# Patient Record
Sex: Male | Born: 1935 | Race: White | Hispanic: No | Marital: Married | Smoking: Former smoker
Health system: Southern US, Community
[De-identification: ages and names within clinical notes are randomized; demographics above are authoritative.]

## PROBLEM LIST (undated history)

## (undated) DIAGNOSIS — C4490 Unspecified malignant neoplasm of skin, unspecified: Secondary | ICD-10-CM

## (undated) DIAGNOSIS — I872 Venous insufficiency (chronic) (peripheral): Secondary | ICD-10-CM

## (undated) DIAGNOSIS — G9340 Encephalopathy, unspecified: Secondary | ICD-10-CM

## (undated) DIAGNOSIS — I4891 Unspecified atrial fibrillation: Secondary | ICD-10-CM

## (undated) DIAGNOSIS — K648 Other hemorrhoids: Secondary | ICD-10-CM

## (undated) DIAGNOSIS — G4733 Obstructive sleep apnea (adult) (pediatric): Secondary | ICD-10-CM

## (undated) DIAGNOSIS — I209 Angina pectoris, unspecified: Secondary | ICD-10-CM

## (undated) DIAGNOSIS — I350 Nonrheumatic aortic (valve) stenosis: Secondary | ICD-10-CM

## (undated) DIAGNOSIS — T4145XA Adverse effect of unspecified anesthetic, initial encounter: Secondary | ICD-10-CM

## (undated) DIAGNOSIS — I8501 Esophageal varices with bleeding: Secondary | ICD-10-CM

## (undated) DIAGNOSIS — M1990 Unspecified osteoarthritis, unspecified site: Secondary | ICD-10-CM

## (undated) DIAGNOSIS — C439 Malignant melanoma of skin, unspecified: Secondary | ICD-10-CM

## (undated) DIAGNOSIS — R001 Bradycardia, unspecified: Secondary | ICD-10-CM

## (undated) DIAGNOSIS — M545 Low back pain, unspecified: Secondary | ICD-10-CM

## (undated) DIAGNOSIS — K2270 Barrett's esophagus without dysplasia: Secondary | ICD-10-CM

## (undated) DIAGNOSIS — T8859XA Other complications of anesthesia, initial encounter: Secondary | ICD-10-CM

## (undated) DIAGNOSIS — Z9289 Personal history of other medical treatment: Secondary | ICD-10-CM

## (undated) DIAGNOSIS — I5030 Unspecified diastolic (congestive) heart failure: Secondary | ICD-10-CM

## (undated) DIAGNOSIS — K219 Gastro-esophageal reflux disease without esophagitis: Secondary | ICD-10-CM

## (undated) DIAGNOSIS — I10 Essential (primary) hypertension: Secondary | ICD-10-CM

## (undated) DIAGNOSIS — Z8719 Personal history of other diseases of the digestive system: Secondary | ICD-10-CM

## (undated) DIAGNOSIS — K224 Dyskinesia of esophagus: Secondary | ICD-10-CM

## (undated) DIAGNOSIS — K5790 Diverticulosis of intestine, part unspecified, without perforation or abscess without bleeding: Secondary | ICD-10-CM

## (undated) DIAGNOSIS — D696 Thrombocytopenia, unspecified: Secondary | ICD-10-CM

## (undated) DIAGNOSIS — K7030 Alcoholic cirrhosis of liver without ascites: Secondary | ICD-10-CM

## (undated) DIAGNOSIS — K31819 Angiodysplasia of stomach and duodenum without bleeding: Secondary | ICD-10-CM

## (undated) DIAGNOSIS — K652 Spontaneous bacterial peritonitis: Principal | ICD-10-CM

## (undated) DIAGNOSIS — IMO0002 Reserved for concepts with insufficient information to code with codable children: Secondary | ICD-10-CM

## (undated) DIAGNOSIS — R0602 Shortness of breath: Secondary | ICD-10-CM

## (undated) DIAGNOSIS — Z9989 Dependence on other enabling machines and devices: Secondary | ICD-10-CM

## (undated) DIAGNOSIS — T17908A Unspecified foreign body in respiratory tract, part unspecified causing other injury, initial encounter: Secondary | ICD-10-CM

## (undated) DIAGNOSIS — R011 Cardiac murmur, unspecified: Secondary | ICD-10-CM

## (undated) DIAGNOSIS — L0390 Cellulitis, unspecified: Secondary | ICD-10-CM

## (undated) DIAGNOSIS — D649 Anemia, unspecified: Secondary | ICD-10-CM

## (undated) DIAGNOSIS — G8929 Other chronic pain: Secondary | ICD-10-CM

## (undated) DIAGNOSIS — E119 Type 2 diabetes mellitus without complications: Secondary | ICD-10-CM

## (undated) DIAGNOSIS — I509 Heart failure, unspecified: Secondary | ICD-10-CM

## (undated) DIAGNOSIS — L711 Rhinophyma: Secondary | ICD-10-CM

## (undated) DIAGNOSIS — I2510 Atherosclerotic heart disease of native coronary artery without angina pectoris: Secondary | ICD-10-CM

## (undated) DIAGNOSIS — R9430 Abnormal result of cardiovascular function study, unspecified: Secondary | ICD-10-CM

## (undated) DIAGNOSIS — R0989 Other specified symptoms and signs involving the circulatory and respiratory systems: Secondary | ICD-10-CM

## (undated) DIAGNOSIS — H5460 Unqualified visual loss, one eye, unspecified: Secondary | ICD-10-CM

## (undated) HISTORY — PX: CATARACT EXTRACTION W/ INTRAOCULAR LENS  IMPLANT, BILATERAL: SHX1307

## (undated) HISTORY — DX: Malignant melanoma of skin, unspecified: C43.9

## (undated) HISTORY — DX: Rhinophyma: L71.1

## (undated) HISTORY — DX: Diverticulosis of intestine, part unspecified, without perforation or abscess without bleeding: K57.90

## (undated) HISTORY — DX: Essential (primary) hypertension: I10

## (undated) HISTORY — DX: Thrombocytopenia, unspecified: D69.6

## (undated) HISTORY — PX: ELBOW SURGERY: SHX618

## (undated) HISTORY — PX: RELEASE SCAR CONTRACTURE / GRAFT REPAIRS OF HAND: SUR1098

## (undated) HISTORY — PX: TONSILLECTOMY: SUR1361

## (undated) HISTORY — DX: Cellulitis, unspecified: L03.90

## (undated) HISTORY — DX: Nonrheumatic aortic (valve) stenosis: I35.0

## (undated) HISTORY — DX: Unspecified diastolic (congestive) heart failure: I50.30

## (undated) HISTORY — PX: TOTAL KNEE ARTHROPLASTY: SHX125

## (undated) HISTORY — DX: Other specified symptoms and signs involving the circulatory and respiratory systems: R09.89

## (undated) HISTORY — PX: EXCISIONAL HEMORRHOIDECTOMY: SHX1541

## (undated) HISTORY — DX: Esophageal varices with bleeding: I85.01

## (undated) HISTORY — PX: HIATAL HERNIA REPAIR: SHX195

## (undated) HISTORY — DX: Unqualified visual loss, one eye, unspecified: H54.60

## (undated) HISTORY — DX: Unspecified foreign body in respiratory tract, part unspecified causing other injury, initial encounter: T17.908A

## (undated) HISTORY — DX: Reserved for concepts with insufficient information to code with codable children: IMO0002

## (undated) HISTORY — PX: BACK SURGERY: SHX140

## (undated) HISTORY — DX: Unspecified malignant neoplasm of skin, unspecified: C44.90

## (undated) HISTORY — DX: Venous insufficiency (chronic) (peripheral): I87.2

## (undated) HISTORY — DX: Abnormal result of cardiovascular function study, unspecified: R94.30

## (undated) HISTORY — DX: Type 2 diabetes mellitus without complications: E11.9

## (undated) HISTORY — DX: Unspecified atrial fibrillation: I48.91

## (undated) HISTORY — PX: JOINT REPLACEMENT: SHX530

## (undated) HISTORY — DX: Gastro-esophageal reflux disease without esophagitis: K21.9

## (undated) HISTORY — DX: Atherosclerotic heart disease of native coronary artery without angina pectoris: I25.10

## (undated) HISTORY — DX: Anemia, unspecified: D64.9

## (undated) HISTORY — DX: Other hemorrhoids: K64.8

## (undated) HISTORY — DX: Encephalopathy, unspecified: G93.40

## (undated) HISTORY — DX: Bradycardia, unspecified: R00.1

## (undated) HISTORY — DX: Barrett's esophagus without dysplasia: K22.70

## (undated) HISTORY — DX: Heart failure, unspecified: I50.9

## (undated) HISTORY — DX: Alcoholic cirrhosis of liver without ascites: K70.30

## (undated) HISTORY — PX: APPENDECTOMY: SHX54

## (undated) HISTORY — DX: Angiodysplasia of stomach and duodenum without bleeding: K31.819

## (undated) HISTORY — DX: Dyskinesia of esophagus: K22.4

## (undated) HISTORY — DX: Spontaneous bacterial peritonitis: K65.2

## (undated) HISTORY — DX: Unspecified osteoarthritis, unspecified site: M19.90

---

## 1991-02-22 HISTORY — PX: CORONARY ARTERY BYPASS GRAFT: SHX141

## 1997-06-02 ENCOUNTER — Encounter: Admission: RE | Admit: 1997-06-02 | Discharge: 1997-08-31 | Payer: Self-pay | Admitting: Internal Medicine

## 1997-10-23 ENCOUNTER — Ambulatory Visit (HOSPITAL_COMMUNITY): Admission: RE | Admit: 1997-10-23 | Discharge: 1997-10-23 | Payer: Self-pay | Admitting: Specialist

## 1998-05-27 ENCOUNTER — Observation Stay (HOSPITAL_COMMUNITY): Admission: AD | Admit: 1998-05-27 | Discharge: 1998-05-28 | Payer: Self-pay | Admitting: *Deleted

## 1998-06-29 ENCOUNTER — Encounter: Payer: Self-pay | Admitting: Specialist

## 1998-06-29 ENCOUNTER — Ambulatory Visit (HOSPITAL_COMMUNITY): Admission: RE | Admit: 1998-06-29 | Discharge: 1998-06-29 | Payer: Self-pay | Admitting: Specialist

## 1998-07-22 ENCOUNTER — Encounter: Payer: Self-pay | Admitting: Specialist

## 1998-07-22 ENCOUNTER — Ambulatory Visit (HOSPITAL_COMMUNITY): Admission: RE | Admit: 1998-07-22 | Discharge: 1998-07-22 | Payer: Self-pay | Admitting: Specialist

## 1999-02-23 ENCOUNTER — Ambulatory Visit: Admission: RE | Admit: 1999-02-23 | Discharge: 1999-02-23 | Payer: Self-pay | Admitting: Internal Medicine

## 1999-03-30 ENCOUNTER — Ambulatory Visit: Admission: RE | Admit: 1999-03-30 | Discharge: 1999-03-30 | Payer: Self-pay | Admitting: Cardiology

## 1999-07-21 ENCOUNTER — Encounter: Payer: Self-pay | Admitting: Specialist

## 1999-07-28 ENCOUNTER — Inpatient Hospital Stay (HOSPITAL_COMMUNITY): Admission: RE | Admit: 1999-07-28 | Discharge: 1999-08-02 | Payer: Self-pay | Admitting: Specialist

## 1999-07-29 ENCOUNTER — Encounter: Payer: Self-pay | Admitting: Internal Medicine

## 1999-07-31 ENCOUNTER — Encounter: Payer: Self-pay | Admitting: Internal Medicine

## 1999-12-28 ENCOUNTER — Ambulatory Visit (HOSPITAL_BASED_OUTPATIENT_CLINIC_OR_DEPARTMENT_OTHER): Admission: RE | Admit: 1999-12-28 | Discharge: 1999-12-28 | Payer: Self-pay | Admitting: Critical Care Medicine

## 2000-02-13 ENCOUNTER — Ambulatory Visit (HOSPITAL_BASED_OUTPATIENT_CLINIC_OR_DEPARTMENT_OTHER): Admission: RE | Admit: 2000-02-13 | Discharge: 2000-02-13 | Payer: Self-pay | Admitting: Critical Care Medicine

## 2001-09-07 ENCOUNTER — Ambulatory Visit (HOSPITAL_COMMUNITY): Admission: RE | Admit: 2001-09-07 | Discharge: 2001-09-07 | Payer: Self-pay | Admitting: Family Medicine

## 2002-03-25 ENCOUNTER — Encounter: Payer: Self-pay | Admitting: Otolaryngology

## 2002-03-25 ENCOUNTER — Encounter: Admission: RE | Admit: 2002-03-25 | Discharge: 2002-03-25 | Payer: Self-pay | Admitting: Otolaryngology

## 2002-04-23 ENCOUNTER — Ambulatory Visit (HOSPITAL_COMMUNITY): Admission: RE | Admit: 2002-04-23 | Discharge: 2002-04-23 | Payer: Self-pay | Admitting: Gastroenterology

## 2002-04-23 ENCOUNTER — Encounter: Payer: Self-pay | Admitting: Gastroenterology

## 2002-05-01 ENCOUNTER — Ambulatory Visit (HOSPITAL_COMMUNITY): Admission: RE | Admit: 2002-05-01 | Discharge: 2002-05-01 | Payer: Self-pay | Admitting: Gastroenterology

## 2002-07-30 ENCOUNTER — Inpatient Hospital Stay (HOSPITAL_COMMUNITY): Admission: EM | Admit: 2002-07-30 | Discharge: 2002-08-02 | Payer: Self-pay | Admitting: Internal Medicine

## 2002-07-31 ENCOUNTER — Encounter: Payer: Self-pay | Admitting: Cardiovascular Disease

## 2002-09-02 ENCOUNTER — Ambulatory Visit (HOSPITAL_COMMUNITY): Admission: RE | Admit: 2002-09-02 | Discharge: 2002-09-02 | Payer: Self-pay | Admitting: Gastroenterology

## 2002-09-02 ENCOUNTER — Encounter: Payer: Self-pay | Admitting: Gastroenterology

## 2003-04-03 ENCOUNTER — Ambulatory Visit (HOSPITAL_COMMUNITY): Admission: RE | Admit: 2003-04-03 | Discharge: 2003-04-03 | Payer: Self-pay | Admitting: Gastroenterology

## 2003-05-01 ENCOUNTER — Encounter: Admission: RE | Admit: 2003-05-01 | Discharge: 2003-05-01 | Payer: Self-pay | Admitting: Specialist

## 2003-07-09 ENCOUNTER — Ambulatory Visit (HOSPITAL_COMMUNITY): Admission: RE | Admit: 2003-07-09 | Discharge: 2003-07-09 | Payer: Self-pay | Admitting: Gastroenterology

## 2003-07-09 ENCOUNTER — Encounter (INDEPENDENT_AMBULATORY_CARE_PROVIDER_SITE_OTHER): Payer: Self-pay | Admitting: *Deleted

## 2004-03-03 ENCOUNTER — Ambulatory Visit: Payer: Self-pay | Admitting: Cardiology

## 2004-03-04 ENCOUNTER — Ambulatory Visit: Admission: RE | Admit: 2004-03-04 | Discharge: 2004-03-04 | Payer: Self-pay | Admitting: Orthopedic Surgery

## 2004-03-04 ENCOUNTER — Ambulatory Visit: Payer: Self-pay | Admitting: Internal Medicine

## 2004-03-10 ENCOUNTER — Ambulatory Visit: Payer: Self-pay | Admitting: Internal Medicine

## 2004-03-11 ENCOUNTER — Ambulatory Visit: Payer: Self-pay

## 2004-03-12 ENCOUNTER — Ambulatory Visit: Payer: Self-pay | Admitting: Hematology & Oncology

## 2004-04-23 ENCOUNTER — Ambulatory Visit (HOSPITAL_COMMUNITY): Admission: RE | Admit: 2004-04-23 | Discharge: 2004-04-23 | Payer: Self-pay | Admitting: Orthopedic Surgery

## 2004-07-27 ENCOUNTER — Ambulatory Visit: Payer: Self-pay | Admitting: Internal Medicine

## 2004-07-29 ENCOUNTER — Ambulatory Visit: Payer: Self-pay | Admitting: Internal Medicine

## 2004-11-14 ENCOUNTER — Emergency Department (HOSPITAL_COMMUNITY): Admission: EM | Admit: 2004-11-14 | Discharge: 2004-11-14 | Payer: Self-pay | Admitting: Emergency Medicine

## 2004-11-18 ENCOUNTER — Ambulatory Visit: Payer: Self-pay | Admitting: Internal Medicine

## 2004-11-18 ENCOUNTER — Inpatient Hospital Stay (HOSPITAL_COMMUNITY): Admission: AD | Admit: 2004-11-18 | Discharge: 2004-11-21 | Payer: Self-pay | Admitting: Internal Medicine

## 2004-11-19 ENCOUNTER — Ambulatory Visit: Payer: Self-pay | Admitting: Internal Medicine

## 2004-11-23 ENCOUNTER — Ambulatory Visit: Payer: Self-pay | Admitting: Gastroenterology

## 2004-12-02 ENCOUNTER — Encounter: Payer: Self-pay | Admitting: Gastroenterology

## 2004-12-02 ENCOUNTER — Ambulatory Visit (HOSPITAL_COMMUNITY): Admission: RE | Admit: 2004-12-02 | Discharge: 2004-12-02 | Payer: Self-pay | Admitting: Gastroenterology

## 2004-12-09 ENCOUNTER — Ambulatory Visit: Payer: Self-pay | Admitting: Gastroenterology

## 2004-12-10 ENCOUNTER — Ambulatory Visit: Payer: Self-pay | Admitting: Internal Medicine

## 2005-01-07 ENCOUNTER — Ambulatory Visit: Payer: Self-pay | Admitting: Gastroenterology

## 2005-01-07 ENCOUNTER — Encounter: Admission: RE | Admit: 2005-01-07 | Discharge: 2005-01-07 | Payer: Self-pay | Admitting: Specialist

## 2005-02-08 ENCOUNTER — Ambulatory Visit: Payer: Self-pay | Admitting: Gastroenterology

## 2005-04-12 ENCOUNTER — Ambulatory Visit: Payer: Self-pay | Admitting: Internal Medicine

## 2005-04-21 ENCOUNTER — Ambulatory Visit: Payer: Self-pay | Admitting: Gastroenterology

## 2005-04-25 ENCOUNTER — Ambulatory Visit: Payer: Self-pay | Admitting: Gastroenterology

## 2005-05-03 ENCOUNTER — Encounter: Admission: RE | Admit: 2005-05-03 | Discharge: 2005-05-31 | Payer: Self-pay | Admitting: Specialist

## 2005-07-08 ENCOUNTER — Ambulatory Visit: Payer: Self-pay | Admitting: Cardiology

## 2005-09-26 ENCOUNTER — Ambulatory Visit: Payer: Self-pay | Admitting: Internal Medicine

## 2005-10-06 ENCOUNTER — Ambulatory Visit: Payer: Self-pay | Admitting: Gastroenterology

## 2005-10-12 ENCOUNTER — Ambulatory Visit: Payer: Self-pay | Admitting: Gastroenterology

## 2005-11-03 ENCOUNTER — Ambulatory Visit: Payer: Self-pay | Admitting: Gastroenterology

## 2005-11-28 ENCOUNTER — Ambulatory Visit: Payer: Self-pay | Admitting: Cardiology

## 2005-11-28 ENCOUNTER — Observation Stay (HOSPITAL_COMMUNITY): Admission: EM | Admit: 2005-11-28 | Discharge: 2005-11-29 | Payer: Self-pay | Admitting: Emergency Medicine

## 2005-12-05 ENCOUNTER — Ambulatory Visit: Payer: Self-pay

## 2005-12-06 ENCOUNTER — Ambulatory Visit: Payer: Self-pay | Admitting: Gastroenterology

## 2005-12-26 ENCOUNTER — Ambulatory Visit: Payer: Self-pay | Admitting: Cardiology

## 2006-03-08 ENCOUNTER — Ambulatory Visit: Payer: Self-pay | Admitting: Internal Medicine

## 2006-03-08 LAB — CONVERTED CEMR LAB
AST: 31 units/L (ref 0–37)
BUN: 9 mg/dL (ref 6–23)
Basophils Absolute: 0 10*3/uL (ref 0.0–0.1)
Bilirubin, Direct: 0.2 mg/dL (ref 0.0–0.3)
CK-MB: 1 ng/mL (ref 0.3–4.0)
Direct LDL: 146.5 mg/dL
Eosinophils Relative: 0.2 % (ref 0.0–5.0)
HCT: 24.9 % — ABNORMAL LOW (ref 39.0–52.0)
Hemoglobin: 7.6 g/dL — CL (ref 13.0–17.0)
Hgb A1c MFr Bld: 7.4 % — ABNORMAL HIGH (ref 4.6–6.0)
Lymphocytes Relative: 26.5 % (ref 12.0–46.0)
MCV: 75.5 fL — ABNORMAL LOW (ref 78.0–100.0)
Monocytes Absolute: 0.4 10*3/uL (ref 0.2–0.7)
Neutrophils Relative %: 63.4 % (ref 43.0–77.0)
RBC: 3.29 M/uL — ABNORMAL LOW (ref 4.22–5.81)
Triglycerides: 97 mg/dL (ref 0–149)
Troponin I: 0.03 ng/mL (ref <0.06)
WBC: 4.4 10*3/uL — ABNORMAL LOW (ref 4.5–10.5)

## 2006-03-10 ENCOUNTER — Encounter: Payer: Self-pay | Admitting: Vascular Surgery

## 2006-03-10 ENCOUNTER — Encounter (INDEPENDENT_AMBULATORY_CARE_PROVIDER_SITE_OTHER): Payer: Self-pay | Admitting: Specialist

## 2006-03-10 ENCOUNTER — Ambulatory Visit: Payer: Self-pay | Admitting: Internal Medicine

## 2006-03-10 ENCOUNTER — Inpatient Hospital Stay (HOSPITAL_COMMUNITY): Admission: AD | Admit: 2006-03-10 | Discharge: 2006-03-14 | Payer: Self-pay | Admitting: Internal Medicine

## 2006-03-17 ENCOUNTER — Ambulatory Visit: Payer: Self-pay | Admitting: Gastroenterology

## 2006-03-17 ENCOUNTER — Ambulatory Visit: Payer: Self-pay | Admitting: Internal Medicine

## 2006-03-21 ENCOUNTER — Ambulatory Visit: Payer: Self-pay | Admitting: Internal Medicine

## 2006-03-21 LAB — CONVERTED CEMR LAB
Basophils Relative: 0.8 % (ref 0.0–1.0)
Eosinophils Relative: 0.2 % (ref 0.0–5.0)
Lymphocytes Relative: 23.8 % (ref 12.0–46.0)
Platelets: 131 10*3/uL — ABNORMAL LOW (ref 150–400)
Prothrombin Time: 14.8 s — ABNORMAL HIGH (ref 10.0–14.0)
RBC: 3.32 M/uL — ABNORMAL LOW (ref 4.22–5.81)
WBC: 4.8 10*3/uL (ref 4.5–10.5)

## 2006-03-23 ENCOUNTER — Ambulatory Visit: Payer: Self-pay | Admitting: Internal Medicine

## 2006-04-03 ENCOUNTER — Ambulatory Visit: Payer: Self-pay | Admitting: Internal Medicine

## 2006-04-03 LAB — CONVERTED CEMR LAB
Basophils Absolute: 0 10*3/uL (ref 0.0–0.1)
Eosinophils Absolute: 0 10*3/uL (ref 0.0–0.6)
HCT: 28.7 % — ABNORMAL LOW (ref 39.0–52.0)
Hemoglobin: 9.3 g/dL — ABNORMAL LOW (ref 13.0–17.0)
MCHC: 32.3 g/dL (ref 30.0–36.0)
MCV: 75.7 fL — ABNORMAL LOW (ref 78.0–100.0)
Monocytes Absolute: 0.4 10*3/uL (ref 0.2–0.7)
Neutrophils Relative %: 59.9 % (ref 43.0–77.0)

## 2006-05-03 ENCOUNTER — Ambulatory Visit: Payer: Self-pay | Admitting: Internal Medicine

## 2006-05-03 LAB — CONVERTED CEMR LAB
Basophils Relative: 0.4 % (ref 0.0–1.0)
Lymphocytes Relative: 28.7 % (ref 12.0–46.0)
Monocytes Relative: 9.2 % (ref 3.0–11.0)
Neutro Abs: 3 10*3/uL (ref 1.4–7.7)
Platelets: 100 10*3/uL — ABNORMAL LOW (ref 150–400)
RDW: 23.7 % — ABNORMAL HIGH (ref 11.5–14.6)

## 2006-05-31 ENCOUNTER — Ambulatory Visit: Payer: Self-pay | Admitting: Internal Medicine

## 2006-05-31 LAB — CONVERTED CEMR LAB
AST: 29 units/L (ref 0–37)
Bilirubin, Direct: 0.1 mg/dL (ref 0.0–0.3)
CO2: 31 meq/L (ref 19–32)
Chloride: 106 meq/L (ref 96–112)
Creatinine, Ser: 1.1 mg/dL (ref 0.4–1.5)
Eosinophils Absolute: 0 10*3/uL (ref 0.0–0.6)
Eosinophils Relative: 0.2 % (ref 0.0–5.0)
GFR calc non Af Amer: 71 mL/min
Glucose, Bld: 157 mg/dL — ABNORMAL HIGH (ref 70–99)
HCT: 36.2 % — ABNORMAL LOW (ref 39.0–52.0)
Hep B S Ab: NEGATIVE
MCV: 81.2 fL (ref 78.0–100.0)
Neutrophils Relative %: 71.2 % (ref 43.0–77.0)
RBC: 4.46 M/uL (ref 4.22–5.81)
Sodium: 140 meq/L (ref 135–145)
Total Bilirubin: 0.7 mg/dL (ref 0.3–1.2)
Total Protein: 6.8 g/dL (ref 6.0–8.3)
WBC: 6.4 10*3/uL (ref 4.5–10.5)

## 2006-08-16 ENCOUNTER — Ambulatory Visit: Payer: Self-pay | Admitting: Internal Medicine

## 2006-08-16 LAB — CONVERTED CEMR LAB
Basophils Absolute: 0 10*3/uL (ref 0.0–0.1)
Eosinophils Absolute: 0 10*3/uL (ref 0.0–0.6)
MCHC: 32.7 g/dL (ref 30.0–36.0)
MCV: 93.3 fL (ref 78.0–100.0)
Monocytes Relative: 6.3 % (ref 3.0–11.0)
Neutro Abs: 3.1 10*3/uL (ref 1.4–7.7)
Platelets: 59 10*3/uL — ABNORMAL LOW (ref 150–400)
RBC: 3.89 M/uL — ABNORMAL LOW (ref 4.22–5.81)

## 2006-10-03 ENCOUNTER — Ambulatory Visit: Payer: Self-pay | Admitting: Internal Medicine

## 2006-12-04 ENCOUNTER — Ambulatory Visit: Payer: Self-pay | Admitting: Internal Medicine

## 2006-12-04 DIAGNOSIS — I10 Essential (primary) hypertension: Secondary | ICD-10-CM | POA: Insufficient documentation

## 2006-12-04 DIAGNOSIS — E782 Mixed hyperlipidemia: Secondary | ICD-10-CM | POA: Insufficient documentation

## 2006-12-04 DIAGNOSIS — M109 Gout, unspecified: Secondary | ICD-10-CM | POA: Insufficient documentation

## 2006-12-04 DIAGNOSIS — D509 Iron deficiency anemia, unspecified: Secondary | ICD-10-CM

## 2006-12-04 DIAGNOSIS — N400 Enlarged prostate without lower urinary tract symptoms: Secondary | ICD-10-CM

## 2006-12-04 LAB — CONVERTED CEMR LAB
HDL goal, serum: 40 mg/dL
LDL Goal: 130 mg/dL

## 2006-12-05 DIAGNOSIS — G4730 Sleep apnea, unspecified: Secondary | ICD-10-CM | POA: Insufficient documentation

## 2006-12-05 DIAGNOSIS — M1990 Unspecified osteoarthritis, unspecified site: Secondary | ICD-10-CM | POA: Insufficient documentation

## 2006-12-05 DIAGNOSIS — Z9089 Acquired absence of other organs: Secondary | ICD-10-CM | POA: Insufficient documentation

## 2006-12-05 DIAGNOSIS — Z8679 Personal history of other diseases of the circulatory system: Secondary | ICD-10-CM | POA: Insufficient documentation

## 2006-12-05 DIAGNOSIS — K219 Gastro-esophageal reflux disease without esophagitis: Secondary | ICD-10-CM | POA: Insufficient documentation

## 2006-12-05 DIAGNOSIS — Z8719 Personal history of other diseases of the digestive system: Secondary | ICD-10-CM

## 2006-12-05 DIAGNOSIS — J309 Allergic rhinitis, unspecified: Secondary | ICD-10-CM | POA: Insufficient documentation

## 2006-12-06 ENCOUNTER — Encounter (INDEPENDENT_AMBULATORY_CARE_PROVIDER_SITE_OTHER): Payer: Self-pay | Admitting: *Deleted

## 2006-12-06 LAB — CONVERTED CEMR LAB
Albumin: 3.7 g/dL (ref 3.5–5.2)
BUN: 14 mg/dL (ref 6–23)
Calcium: 9.3 mg/dL (ref 8.4–10.5)
Chloride: 105 meq/L (ref 96–112)
Creatinine, Ser: 0.9 mg/dL (ref 0.4–1.5)
Creatinine,U: 207.5 mg/dL
GFR calc Af Amer: 108 mL/min
Microalb, Ur: 0.4 mg/dL (ref 0.0–1.9)
Potassium: 4 meq/L (ref 3.5–5.1)
Sodium: 144 meq/L (ref 135–145)
Total Bilirubin: 0.7 mg/dL (ref 0.3–1.2)
Total Protein: 6.7 g/dL (ref 6.0–8.3)
Uric Acid, Serum: 4.4 mg/dL (ref 2.4–7.0)

## 2006-12-11 ENCOUNTER — Encounter: Payer: Self-pay | Admitting: Internal Medicine

## 2006-12-13 ENCOUNTER — Encounter: Payer: Self-pay | Admitting: Internal Medicine

## 2006-12-26 ENCOUNTER — Emergency Department (HOSPITAL_COMMUNITY): Admission: EM | Admit: 2006-12-26 | Discharge: 2006-12-26 | Payer: Self-pay | Admitting: Emergency Medicine

## 2006-12-28 ENCOUNTER — Ambulatory Visit: Payer: Self-pay | Admitting: Internal Medicine

## 2006-12-28 ENCOUNTER — Telehealth (INDEPENDENT_AMBULATORY_CARE_PROVIDER_SITE_OTHER): Payer: Self-pay | Admitting: *Deleted

## 2006-12-28 DIAGNOSIS — D696 Thrombocytopenia, unspecified: Secondary | ICD-10-CM

## 2007-01-10 ENCOUNTER — Ambulatory Visit: Payer: Self-pay | Admitting: Internal Medicine

## 2007-02-08 ENCOUNTER — Ambulatory Visit: Payer: Self-pay | Admitting: Internal Medicine

## 2007-02-08 ENCOUNTER — Encounter: Payer: Self-pay | Admitting: Internal Medicine

## 2007-02-08 LAB — CONVERTED CEMR LAB
Basophils Relative: 0.3 % (ref 0.0–1.0)
Bilirubin, Direct: 0.2 mg/dL (ref 0.0–0.3)
CO2: 29 meq/L (ref 19–32)
Eosinophils Relative: 0 % (ref 0.0–5.0)
GFR calc Af Amer: 85 mL/min
Glucose, Bld: 206 mg/dL — ABNORMAL HIGH (ref 70–99)
Hemoglobin: 12.6 g/dL — ABNORMAL LOW (ref 13.0–17.0)
Lymphocytes Relative: 32.5 % (ref 12.0–46.0)
Monocytes Absolute: 0.4 10*3/uL (ref 0.2–0.7)
Neutro Abs: 2.9 10*3/uL (ref 1.4–7.7)
Potassium: 3.9 meq/L (ref 3.5–5.1)
Prothrombin Time: 13.7 s — ABNORMAL HIGH (ref 10.9–13.3)
Total Protein: 6.8 g/dL (ref 6.0–8.3)
WBC: 4.9 10*3/uL (ref 4.5–10.5)

## 2007-02-14 ENCOUNTER — Encounter: Payer: Self-pay | Admitting: Internal Medicine

## 2007-02-14 ENCOUNTER — Ambulatory Visit: Payer: Self-pay | Admitting: Internal Medicine

## 2007-02-14 HISTORY — PX: COLONOSCOPY W/ POLYPECTOMY: SHX1380

## 2007-02-21 ENCOUNTER — Telehealth: Payer: Self-pay | Admitting: Internal Medicine

## 2007-02-21 ENCOUNTER — Emergency Department (HOSPITAL_COMMUNITY): Admission: EM | Admit: 2007-02-21 | Discharge: 2007-02-21 | Payer: Self-pay | Admitting: Emergency Medicine

## 2007-03-15 ENCOUNTER — Ambulatory Visit: Payer: Self-pay | Admitting: Internal Medicine

## 2007-03-17 LAB — CONVERTED CEMR LAB
AST: 38 units/L — ABNORMAL HIGH (ref 0–37)
Albumin: 3.7 g/dL (ref 3.5–5.2)
Bilirubin, Direct: 0.2 mg/dL (ref 0.0–0.3)
Cholesterol: 272 mg/dL (ref 0–200)
HDL: 32.4 mg/dL — ABNORMAL LOW (ref >39.0)
Hgb A1c MFr Bld: 7 % — ABNORMAL HIGH (ref 4.6–6.0)
Total Bilirubin: 0.9 mg/dL (ref 0.3–1.2)

## 2007-03-19 ENCOUNTER — Encounter (INDEPENDENT_AMBULATORY_CARE_PROVIDER_SITE_OTHER): Payer: Self-pay | Admitting: *Deleted

## 2007-03-19 ENCOUNTER — Ambulatory Visit: Payer: Self-pay | Admitting: Internal Medicine

## 2007-03-19 DIAGNOSIS — Z860100 Personal history of colon polyps, unspecified: Secondary | ICD-10-CM | POA: Insufficient documentation

## 2007-03-20 ENCOUNTER — Encounter: Payer: Self-pay | Admitting: Internal Medicine

## 2007-03-23 ENCOUNTER — Ambulatory Visit: Payer: Self-pay | Admitting: Cardiology

## 2007-03-23 ENCOUNTER — Ambulatory Visit (HOSPITAL_COMMUNITY): Admission: RE | Admit: 2007-03-23 | Discharge: 2007-03-23 | Payer: Self-pay | Admitting: Internal Medicine

## 2007-04-11 ENCOUNTER — Ambulatory Visit (HOSPITAL_BASED_OUTPATIENT_CLINIC_OR_DEPARTMENT_OTHER): Admission: RE | Admit: 2007-04-11 | Discharge: 2007-04-11 | Payer: Self-pay | Admitting: *Deleted

## 2007-05-18 ENCOUNTER — Telehealth (INDEPENDENT_AMBULATORY_CARE_PROVIDER_SITE_OTHER): Payer: Self-pay | Admitting: *Deleted

## 2007-05-22 ENCOUNTER — Telehealth (INDEPENDENT_AMBULATORY_CARE_PROVIDER_SITE_OTHER): Payer: Self-pay | Admitting: *Deleted

## 2007-06-19 ENCOUNTER — Telehealth (INDEPENDENT_AMBULATORY_CARE_PROVIDER_SITE_OTHER): Payer: Self-pay | Admitting: *Deleted

## 2007-06-19 ENCOUNTER — Ambulatory Visit: Payer: Self-pay | Admitting: Internal Medicine

## 2007-08-14 ENCOUNTER — Ambulatory Visit: Payer: Self-pay

## 2007-09-28 ENCOUNTER — Ambulatory Visit: Payer: Self-pay | Admitting: Cardiology

## 2007-11-21 ENCOUNTER — Ambulatory Visit: Payer: Self-pay | Admitting: Internal Medicine

## 2007-11-22 ENCOUNTER — Telehealth: Payer: Self-pay | Admitting: Internal Medicine

## 2007-11-22 LAB — CONVERTED CEMR LAB
BUN: 23 mg/dL (ref 6–23)
Calcium: 9.4 mg/dL (ref 8.4–10.5)
Magnesium: 2.4 mg/dL (ref 1.5–2.5)

## 2007-11-23 ENCOUNTER — Encounter (INDEPENDENT_AMBULATORY_CARE_PROVIDER_SITE_OTHER): Payer: Self-pay | Admitting: *Deleted

## 2008-01-25 ENCOUNTER — Ambulatory Visit: Payer: Self-pay | Admitting: Internal Medicine

## 2008-01-25 LAB — CONVERTED CEMR LAB: Vit D, 1,25-Dihydroxy: 43 (ref 30–89)

## 2008-01-28 ENCOUNTER — Ambulatory Visit (HOSPITAL_COMMUNITY): Admission: RE | Admit: 2008-01-28 | Discharge: 2008-01-28 | Payer: Self-pay | Admitting: Internal Medicine

## 2008-01-31 ENCOUNTER — Ambulatory Visit: Payer: Self-pay | Admitting: Internal Medicine

## 2008-01-31 LAB — CONVERTED CEMR LAB
AST: 49 units/L — ABNORMAL HIGH (ref 0–37)
Alkaline Phosphatase: 67 units/L (ref 39–117)
BUN: 19 mg/dL (ref 6–23)
Basophils Relative: 0.8 % (ref 0.0–3.0)
Cholesterol: 261 mg/dL (ref 0–200)
Creatinine, Ser: 0.8 mg/dL (ref 0.4–1.5)
Direct LDL: 200.8 mg/dL
Eosinophils Absolute: 0 10*3/uL (ref 0.0–0.7)
Eosinophils Relative: 0.1 % (ref 0.0–5.0)
Ferritin: 65.5 ng/mL (ref 22.0–322.0)
GFR calc non Af Amer: 102 mL/min
Glucose, Bld: 134 mg/dL — ABNORMAL HIGH (ref 70–99)
HCT: 36.5 % — ABNORMAL LOW (ref 39.0–52.0)
Hemoglobin: 12.4 g/dL — ABNORMAL LOW (ref 13.0–17.0)
MCV: 103.4 fL — ABNORMAL HIGH (ref 78.0–100.0)
Monocytes Absolute: 0.4 10*3/uL (ref 0.1–1.0)
Neutro Abs: 4.1 10*3/uL (ref 1.4–7.7)
Platelets: 57 10*3/uL — ABNORMAL LOW (ref 150–400)
RBC: 3.53 M/uL — ABNORMAL LOW (ref 4.22–5.81)
Total Bilirubin: 1.1 mg/dL (ref 0.3–1.2)
VLDL: 14 mg/dL (ref 0–40)
WBC: 5.8 10*3/uL (ref 4.5–10.5)

## 2008-02-01 ENCOUNTER — Telehealth: Payer: Self-pay | Admitting: Internal Medicine

## 2008-02-27 ENCOUNTER — Ambulatory Visit: Payer: Self-pay | Admitting: Internal Medicine

## 2008-02-27 DIAGNOSIS — R300 Dysuria: Secondary | ICD-10-CM

## 2008-02-27 DIAGNOSIS — H6980 Other specified disorders of Eustachian tube, unspecified ear: Secondary | ICD-10-CM

## 2008-02-28 ENCOUNTER — Encounter: Payer: Self-pay | Admitting: Internal Medicine

## 2008-03-04 ENCOUNTER — Telehealth (INDEPENDENT_AMBULATORY_CARE_PROVIDER_SITE_OTHER): Payer: Self-pay | Admitting: *Deleted

## 2008-03-24 HISTORY — PX: TIPS PROCEDURE: SHX808

## 2008-03-27 ENCOUNTER — Telehealth: Payer: Self-pay | Admitting: Internal Medicine

## 2008-03-31 ENCOUNTER — Ambulatory Visit: Payer: Self-pay | Admitting: Internal Medicine

## 2008-04-05 LAB — CONVERTED CEMR LAB: Hgb A1c MFr Bld: 6.6 % — ABNORMAL HIGH (ref 4.6–6.0)

## 2008-04-07 ENCOUNTER — Encounter (INDEPENDENT_AMBULATORY_CARE_PROVIDER_SITE_OTHER): Payer: Self-pay | Admitting: *Deleted

## 2008-04-08 ENCOUNTER — Ambulatory Visit: Payer: Self-pay | Admitting: Internal Medicine

## 2008-04-08 ENCOUNTER — Telehealth (INDEPENDENT_AMBULATORY_CARE_PROVIDER_SITE_OTHER): Payer: Self-pay | Admitting: *Deleted

## 2008-04-10 ENCOUNTER — Inpatient Hospital Stay (HOSPITAL_COMMUNITY): Admission: AD | Admit: 2008-04-10 | Discharge: 2008-04-16 | Payer: Self-pay | Admitting: Internal Medicine

## 2008-04-10 ENCOUNTER — Ambulatory Visit: Payer: Self-pay | Admitting: Internal Medicine

## 2008-04-10 HISTORY — PX: ESOPHAGOGASTRODUODENOSCOPY: SHX1529

## 2008-04-11 ENCOUNTER — Encounter: Payer: Self-pay | Admitting: Internal Medicine

## 2008-04-22 ENCOUNTER — Encounter: Admission: RE | Admit: 2008-04-22 | Discharge: 2008-04-22 | Payer: Self-pay | Admitting: Internal Medicine

## 2008-04-22 ENCOUNTER — Encounter: Payer: Self-pay | Admitting: Internal Medicine

## 2008-04-24 ENCOUNTER — Ambulatory Visit: Payer: Self-pay | Admitting: Internal Medicine

## 2008-04-24 ENCOUNTER — Telehealth: Payer: Self-pay | Admitting: Internal Medicine

## 2008-04-25 ENCOUNTER — Telehealth (INDEPENDENT_AMBULATORY_CARE_PROVIDER_SITE_OTHER): Payer: Self-pay | Admitting: *Deleted

## 2008-04-27 LAB — CONVERTED CEMR LAB
BUN: 12 mg/dL (ref 6–23)
Basophils Absolute: 0 10*3/uL (ref 0.0–0.1)
Lymphocytes Relative: 26.5 % (ref 12.0–46.0)
MCHC: 33 g/dL (ref 30.0–36.0)
Monocytes Absolute: 0.6 10*3/uL (ref 0.1–1.0)
Monocytes Relative: 8.5 % (ref 3.0–12.0)
Platelets: 93 10*3/uL — ABNORMAL LOW (ref 150–400)
Potassium: 3.8 meq/L (ref 3.5–5.1)
RDW: 18.4 % — ABNORMAL HIGH (ref 11.5–14.6)

## 2008-04-28 ENCOUNTER — Encounter (INDEPENDENT_AMBULATORY_CARE_PROVIDER_SITE_OTHER): Payer: Self-pay | Admitting: *Deleted

## 2008-05-02 ENCOUNTER — Inpatient Hospital Stay (HOSPITAL_COMMUNITY): Admission: EM | Admit: 2008-05-02 | Discharge: 2008-05-04 | Payer: Self-pay | Admitting: Emergency Medicine

## 2008-05-02 ENCOUNTER — Telehealth (INDEPENDENT_AMBULATORY_CARE_PROVIDER_SITE_OTHER): Payer: Self-pay | Admitting: *Deleted

## 2008-05-07 ENCOUNTER — Ambulatory Visit: Payer: Self-pay | Admitting: Internal Medicine

## 2008-05-07 DIAGNOSIS — K7290 Hepatic failure, unspecified without coma: Secondary | ICD-10-CM

## 2008-05-08 LAB — CONVERTED CEMR LAB
Ammonia: 87 umol/L — ABNORMAL HIGH (ref 11–35)
Basophils Relative: 0.9 % (ref 0.0–3.0)
Calcium: 9.4 mg/dL (ref 8.4–10.5)
Eosinophils Absolute: 0 10*3/uL (ref 0.0–0.7)
GFR calc non Af Amer: 101.48 mL/min (ref >60)
Glucose, Bld: 312 mg/dL — ABNORMAL HIGH (ref 70–99)
Lymphocytes Relative: 24.2 % (ref 12.0–46.0)
MCHC: 33.1 g/dL (ref 30.0–36.0)
MCV: 90.7 fL (ref 78.0–100.0)
Monocytes Absolute: 0.6 10*3/uL (ref 0.1–1.0)
Neutrophils Relative %: 66 % (ref 43.0–77.0)
Platelets: 86 10*3/uL — ABNORMAL LOW (ref 150.0–400.0)
Potassium: 4.4 meq/L (ref 3.5–5.1)
RBC: 3.43 M/uL — ABNORMAL LOW (ref 4.22–5.81)
Sodium: 140 meq/L (ref 135–145)
WBC: 6.6 10*3/uL (ref 4.5–10.5)

## 2008-05-11 LAB — CONVERTED CEMR LAB
Ferritin: 26 ng/mL (ref 22.0–322.0)
Vitamin B-12: 486 pg/mL (ref 211–911)

## 2008-05-13 ENCOUNTER — Ambulatory Visit: Payer: Self-pay | Admitting: Gastroenterology

## 2008-05-13 ENCOUNTER — Telehealth: Payer: Self-pay | Admitting: Internal Medicine

## 2008-05-13 ENCOUNTER — Ambulatory Visit (HOSPITAL_COMMUNITY): Admission: RE | Admit: 2008-05-13 | Discharge: 2008-05-13 | Payer: Self-pay | Admitting: Internal Medicine

## 2008-05-13 ENCOUNTER — Encounter (INDEPENDENT_AMBULATORY_CARE_PROVIDER_SITE_OTHER): Payer: Self-pay | Admitting: *Deleted

## 2008-05-15 ENCOUNTER — Ambulatory Visit: Payer: Self-pay | Admitting: Internal Medicine

## 2008-05-18 LAB — CONVERTED CEMR LAB
Ammonia: 83 umol/L — ABNORMAL HIGH (ref 11–35)
Basophils Relative: 0.4 % (ref 0.0–3.0)
CO2: 28 meq/L (ref 19–32)
Calcium: 9.7 mg/dL (ref 8.4–10.5)
Eosinophils Relative: 0 % (ref 0.0–5.0)
Glucose, Bld: 346 mg/dL — ABNORMAL HIGH (ref 70–99)
Lymphocytes Relative: 28.6 % (ref 12.0–46.0)
MCV: 92.4 fL (ref 78.0–100.0)
Monocytes Absolute: 0.5 10*3/uL (ref 0.1–1.0)
Monocytes Relative: 5.9 % (ref 3.0–12.0)
Neutrophils Relative %: 65.1 % (ref 43.0–77.0)
Platelets: 94 10*3/uL — ABNORMAL LOW (ref 150.0–400.0)
Potassium: 4.4 meq/L (ref 3.5–5.1)
RBC: 3.9 M/uL — ABNORMAL LOW (ref 4.22–5.81)
Sodium: 142 meq/L (ref 135–145)
WBC: 7.7 10*3/uL (ref 4.5–10.5)

## 2008-05-21 ENCOUNTER — Encounter (INDEPENDENT_AMBULATORY_CARE_PROVIDER_SITE_OTHER): Payer: Self-pay | Admitting: *Deleted

## 2008-05-21 ENCOUNTER — Ambulatory Visit: Payer: Self-pay | Admitting: Internal Medicine

## 2008-05-21 DIAGNOSIS — K7030 Alcoholic cirrhosis of liver without ascites: Secondary | ICD-10-CM

## 2008-05-22 ENCOUNTER — Telehealth (INDEPENDENT_AMBULATORY_CARE_PROVIDER_SITE_OTHER): Payer: Self-pay | Admitting: *Deleted

## 2008-05-22 LAB — CONVERTED CEMR LAB
Ammonia: 111 umol/L — ABNORMAL HIGH (ref 11–35)
Chloride: 105 meq/L (ref 96–112)
GFR calc non Af Amer: 78.44 mL/min (ref >60)
Glucose, Bld: 319 mg/dL — ABNORMAL HIGH (ref 70–99)
Potassium: 4.1 meq/L (ref 3.5–5.1)
Sodium: 141 meq/L (ref 135–145)

## 2008-05-26 ENCOUNTER — Encounter: Payer: Self-pay | Admitting: Internal Medicine

## 2008-05-26 ENCOUNTER — Ambulatory Visit: Payer: Self-pay | Admitting: Internal Medicine

## 2008-05-27 ENCOUNTER — Telehealth (INDEPENDENT_AMBULATORY_CARE_PROVIDER_SITE_OTHER): Payer: Self-pay | Admitting: *Deleted

## 2008-05-30 LAB — CONVERTED CEMR LAB: Ammonia: 81 umol/L — ABNORMAL HIGH (ref 11–35)

## 2008-06-11 ENCOUNTER — Telehealth: Payer: Self-pay | Admitting: Cardiology

## 2008-06-11 ENCOUNTER — Ambulatory Visit: Payer: Self-pay | Admitting: Internal Medicine

## 2008-06-13 LAB — CONVERTED CEMR LAB
ALT: 24 units/L (ref 0–53)
Albumin: 3.5 g/dL (ref 3.5–5.2)
Basophils Relative: 0.3 % (ref 0.0–3.0)
CO2: 30 meq/L (ref 19–32)
Calcium: 9.9 mg/dL (ref 8.4–10.5)
Chloride: 107 meq/L (ref 96–112)
Eosinophils Relative: 0 % (ref 0.0–5.0)
GFR calc non Af Amer: 78.42 mL/min (ref >60)
HCT: 37.3 % — ABNORMAL LOW (ref 39.0–52.0)
Monocytes Relative: 8.1 % (ref 3.0–12.0)
Neutrophils Relative %: 64.3 % (ref 43.0–77.0)
Platelets: 62 10*3/uL — ABNORMAL LOW (ref 150.0–400.0)
RBC: 4.07 M/uL — ABNORMAL LOW (ref 4.22–5.81)
Sodium: 142 meq/L (ref 135–145)
Total Protein: 7.1 g/dL (ref 6.0–8.3)
WBC: 5.6 10*3/uL (ref 4.5–10.5)

## 2008-06-18 ENCOUNTER — Ambulatory Visit: Payer: Self-pay | Admitting: Family Medicine

## 2008-06-20 ENCOUNTER — Telehealth (INDEPENDENT_AMBULATORY_CARE_PROVIDER_SITE_OTHER): Payer: Self-pay | Admitting: *Deleted

## 2008-06-20 ENCOUNTER — Telehealth: Payer: Self-pay | Admitting: Internal Medicine

## 2008-06-22 LAB — CONVERTED CEMR LAB
Eosinophils Relative: 0 % (ref 0.0–5.0)
HCT: 34.7 % — ABNORMAL LOW (ref 39.0–52.0)
Hgb A1c MFr Bld: 8.6 % — ABNORMAL HIGH (ref 4.6–6.5)
Lymphs Abs: 1.6 10*3/uL (ref 0.7–4.0)
MCHC: 34.5 g/dL (ref 30.0–36.0)
MCV: 92.1 fL (ref 78.0–100.0)
Monocytes Absolute: 0.4 10*3/uL (ref 0.1–1.0)
Platelets: 52 10*3/uL — ABNORMAL LOW (ref 150.0–400.0)
RDW: 19.7 % — ABNORMAL HIGH (ref 11.5–14.6)

## 2008-06-24 ENCOUNTER — Encounter (INDEPENDENT_AMBULATORY_CARE_PROVIDER_SITE_OTHER): Payer: Self-pay | Admitting: *Deleted

## 2008-06-25 ENCOUNTER — Telehealth (INDEPENDENT_AMBULATORY_CARE_PROVIDER_SITE_OTHER): Payer: Self-pay | Admitting: *Deleted

## 2008-07-07 ENCOUNTER — Ambulatory Visit: Payer: Self-pay | Admitting: Internal Medicine

## 2008-08-05 ENCOUNTER — Telehealth: Payer: Self-pay | Admitting: Internal Medicine

## 2008-08-18 ENCOUNTER — Ambulatory Visit: Payer: Self-pay | Admitting: Internal Medicine

## 2008-08-18 ENCOUNTER — Telehealth: Payer: Self-pay | Admitting: Internal Medicine

## 2008-08-19 LAB — CONVERTED CEMR LAB
ALT: 20 units/L (ref 0–53)
AST: 33 units/L (ref 0–37)
Alkaline Phosphatase: 73 units/L (ref 39–117)
Basophils Absolute: 0 10*3/uL (ref 0.0–0.1)
Chloride: 105 meq/L (ref 96–112)
Creatinine, Ser: 1.1 mg/dL (ref 0.4–1.5)
Eosinophils Absolute: 0 10*3/uL (ref 0.0–0.7)
Hemoglobin: 10.7 g/dL — ABNORMAL LOW (ref 13.0–17.0)
Lymphocytes Relative: 33.2 % (ref 12.0–46.0)
Lymphs Abs: 1.7 10*3/uL (ref 0.7–4.0)
MCHC: 33.2 g/dL (ref 30.0–36.0)
Neutro Abs: 2.9 10*3/uL (ref 1.4–7.7)
RDW: 14.8 % — ABNORMAL HIGH (ref 11.5–14.6)
Total Bilirubin: 1.1 mg/dL (ref 0.3–1.2)

## 2008-08-27 ENCOUNTER — Ambulatory Visit: Payer: Self-pay | Admitting: Internal Medicine

## 2008-08-28 ENCOUNTER — Telehealth (INDEPENDENT_AMBULATORY_CARE_PROVIDER_SITE_OTHER): Payer: Self-pay | Admitting: *Deleted

## 2008-08-29 ENCOUNTER — Ambulatory Visit: Payer: Self-pay | Admitting: Internal Medicine

## 2008-08-29 DIAGNOSIS — M545 Low back pain: Secondary | ICD-10-CM

## 2008-08-30 ENCOUNTER — Encounter: Payer: Self-pay | Admitting: Internal Medicine

## 2008-09-02 ENCOUNTER — Encounter (INDEPENDENT_AMBULATORY_CARE_PROVIDER_SITE_OTHER): Payer: Self-pay | Admitting: *Deleted

## 2008-09-02 LAB — CONVERTED CEMR LAB: Hgb A1c MFr Bld: 8.6 % — ABNORMAL HIGH (ref 4.6–6.5)

## 2008-09-03 ENCOUNTER — Telehealth: Payer: Self-pay | Admitting: Internal Medicine

## 2008-09-03 ENCOUNTER — Telehealth (INDEPENDENT_AMBULATORY_CARE_PROVIDER_SITE_OTHER): Payer: Self-pay | Admitting: *Deleted

## 2008-09-05 ENCOUNTER — Encounter: Payer: Self-pay | Admitting: Internal Medicine

## 2008-09-16 ENCOUNTER — Encounter: Admission: RE | Admit: 2008-09-16 | Discharge: 2008-09-29 | Payer: Self-pay | Admitting: Specialist

## 2008-09-24 ENCOUNTER — Encounter: Admission: RE | Admit: 2008-09-24 | Discharge: 2008-09-24 | Payer: Self-pay | Admitting: Internal Medicine

## 2008-09-24 ENCOUNTER — Encounter: Payer: Self-pay | Admitting: Internal Medicine

## 2008-09-24 ENCOUNTER — Encounter: Admission: RE | Admit: 2008-09-24 | Discharge: 2008-09-24 | Payer: Self-pay | Admitting: Interventional Radiology

## 2008-09-30 ENCOUNTER — Ambulatory Visit: Payer: Self-pay | Admitting: Endocrinology

## 2008-10-13 ENCOUNTER — Telehealth (INDEPENDENT_AMBULATORY_CARE_PROVIDER_SITE_OTHER): Payer: Self-pay | Admitting: *Deleted

## 2008-10-13 ENCOUNTER — Ambulatory Visit: Payer: Self-pay | Admitting: Internal Medicine

## 2008-10-13 LAB — CONVERTED CEMR LAB
Ammonia: 104 umol/L — ABNORMAL HIGH (ref 11–35)
CO2: 27 meq/L (ref 19–32)
Calcium: 9.8 mg/dL (ref 8.4–10.5)
Creatinine, Ser: 1 mg/dL (ref 0.4–1.5)
Eosinophils Relative: 0.1 % (ref 0.0–5.0)
GFR calc non Af Amer: 78.35 mL/min (ref >60)
Glucose, Bld: 180 mg/dL — ABNORMAL HIGH (ref 70–99)
HCT: 36.9 % — ABNORMAL LOW (ref 39.0–52.0)
Hemoglobin: 12.5 g/dL — ABNORMAL LOW (ref 13.0–17.0)
Lymphs Abs: 1.7 10*3/uL (ref 0.7–4.0)
Monocytes Relative: 8.3 % (ref 3.0–12.0)
Neutro Abs: 3.9 10*3/uL (ref 1.4–7.7)
RBC: 3.84 M/uL — ABNORMAL LOW (ref 4.22–5.81)
Total Bilirubin: 1.2 mg/dL (ref 0.3–1.2)
Total Protein: 6.9 g/dL (ref 6.0–8.3)
WBC: 6.1 10*3/uL (ref 4.5–10.5)

## 2008-10-15 ENCOUNTER — Telehealth: Payer: Self-pay | Admitting: Internal Medicine

## 2008-10-20 ENCOUNTER — Ambulatory Visit: Payer: Self-pay | Admitting: Internal Medicine

## 2008-10-23 ENCOUNTER — Telehealth (INDEPENDENT_AMBULATORY_CARE_PROVIDER_SITE_OTHER): Payer: Self-pay | Admitting: *Deleted

## 2008-10-28 ENCOUNTER — Ambulatory Visit: Payer: Self-pay | Admitting: Endocrinology

## 2008-11-03 ENCOUNTER — Encounter: Payer: Self-pay | Admitting: Endocrinology

## 2008-11-04 ENCOUNTER — Encounter: Payer: Self-pay | Admitting: Endocrinology

## 2008-11-04 ENCOUNTER — Encounter: Payer: Self-pay | Admitting: Cardiology

## 2008-11-04 ENCOUNTER — Encounter: Payer: Self-pay | Admitting: Internal Medicine

## 2008-11-05 ENCOUNTER — Encounter: Payer: Self-pay | Admitting: Internal Medicine

## 2008-11-06 ENCOUNTER — Encounter (INDEPENDENT_AMBULATORY_CARE_PROVIDER_SITE_OTHER): Payer: Self-pay | Admitting: *Deleted

## 2008-11-06 ENCOUNTER — Ambulatory Visit: Payer: Self-pay | Admitting: Internal Medicine

## 2008-11-06 DIAGNOSIS — L03119 Cellulitis of unspecified part of limb: Secondary | ICD-10-CM

## 2008-11-06 DIAGNOSIS — L02419 Cutaneous abscess of limb, unspecified: Secondary | ICD-10-CM | POA: Insufficient documentation

## 2008-11-08 ENCOUNTER — Encounter: Payer: Self-pay | Admitting: Internal Medicine

## 2008-11-10 ENCOUNTER — Telehealth (INDEPENDENT_AMBULATORY_CARE_PROVIDER_SITE_OTHER): Payer: Self-pay | Admitting: *Deleted

## 2008-11-11 ENCOUNTER — Ambulatory Visit: Payer: Self-pay | Admitting: Endocrinology

## 2008-11-12 ENCOUNTER — Encounter: Payer: Self-pay | Admitting: Cardiology

## 2008-11-12 ENCOUNTER — Encounter (INDEPENDENT_AMBULATORY_CARE_PROVIDER_SITE_OTHER): Payer: Self-pay | Admitting: *Deleted

## 2008-11-12 DIAGNOSIS — Z8679 Personal history of other diseases of the circulatory system: Secondary | ICD-10-CM

## 2008-11-12 DIAGNOSIS — R002 Palpitations: Secondary | ICD-10-CM | POA: Insufficient documentation

## 2008-11-12 DIAGNOSIS — Z951 Presence of aortocoronary bypass graft: Secondary | ICD-10-CM | POA: Insufficient documentation

## 2008-11-13 ENCOUNTER — Encounter: Payer: Self-pay | Admitting: Internal Medicine

## 2008-11-14 ENCOUNTER — Ambulatory Visit: Payer: Self-pay | Admitting: Cardiology

## 2008-11-18 ENCOUNTER — Ambulatory Visit: Payer: Self-pay | Admitting: Internal Medicine

## 2008-11-18 DIAGNOSIS — B379 Candidiasis, unspecified: Secondary | ICD-10-CM | POA: Insufficient documentation

## 2008-11-18 DIAGNOSIS — N62 Hypertrophy of breast: Secondary | ICD-10-CM

## 2008-11-19 ENCOUNTER — Encounter (INDEPENDENT_AMBULATORY_CARE_PROVIDER_SITE_OTHER): Payer: Self-pay

## 2008-11-23 ENCOUNTER — Telehealth: Payer: Self-pay | Admitting: Internal Medicine

## 2008-11-24 ENCOUNTER — Inpatient Hospital Stay (HOSPITAL_COMMUNITY): Admission: EM | Admit: 2008-11-24 | Discharge: 2008-11-27 | Payer: Self-pay | Admitting: Emergency Medicine

## 2008-11-24 ENCOUNTER — Encounter (INDEPENDENT_AMBULATORY_CARE_PROVIDER_SITE_OTHER): Payer: Self-pay

## 2008-11-25 ENCOUNTER — Encounter: Payer: Self-pay | Admitting: Internal Medicine

## 2008-11-27 ENCOUNTER — Encounter: Payer: Self-pay | Admitting: Internal Medicine

## 2008-12-02 ENCOUNTER — Ambulatory Visit: Payer: Self-pay | Admitting: Endocrinology

## 2008-12-04 ENCOUNTER — Telehealth: Payer: Self-pay | Admitting: Internal Medicine

## 2008-12-15 ENCOUNTER — Encounter: Payer: Self-pay | Admitting: Internal Medicine

## 2008-12-15 ENCOUNTER — Ambulatory Visit: Payer: Self-pay | Admitting: Internal Medicine

## 2008-12-17 ENCOUNTER — Ambulatory Visit: Payer: Self-pay | Admitting: Internal Medicine

## 2008-12-17 DIAGNOSIS — R7989 Other specified abnormal findings of blood chemistry: Secondary | ICD-10-CM | POA: Insufficient documentation

## 2008-12-17 DIAGNOSIS — T887XXA Unspecified adverse effect of drug or medicament, initial encounter: Secondary | ICD-10-CM | POA: Insufficient documentation

## 2008-12-18 ENCOUNTER — Telehealth: Payer: Self-pay | Admitting: Internal Medicine

## 2008-12-18 ENCOUNTER — Ambulatory Visit: Payer: Self-pay | Admitting: Internal Medicine

## 2008-12-18 ENCOUNTER — Ambulatory Visit: Payer: Self-pay | Admitting: Vascular Surgery

## 2008-12-18 ENCOUNTER — Inpatient Hospital Stay (HOSPITAL_COMMUNITY): Admission: EM | Admit: 2008-12-18 | Discharge: 2008-12-23 | Payer: Self-pay | Admitting: Emergency Medicine

## 2008-12-18 ENCOUNTER — Encounter: Payer: Self-pay | Admitting: Internal Medicine

## 2008-12-18 ENCOUNTER — Encounter (INDEPENDENT_AMBULATORY_CARE_PROVIDER_SITE_OTHER): Payer: Self-pay | Admitting: *Deleted

## 2008-12-18 LAB — CONVERTED CEMR LAB
BUN: 14 mg/dL (ref 6–23)
Uric Acid, Serum: 4.6 mg/dL (ref 4.0–7.8)

## 2008-12-19 ENCOUNTER — Telehealth: Payer: Self-pay | Admitting: Internal Medicine

## 2008-12-23 ENCOUNTER — Telehealth: Payer: Self-pay | Admitting: Internal Medicine

## 2008-12-30 ENCOUNTER — Encounter: Payer: Self-pay | Admitting: Internal Medicine

## 2008-12-30 ENCOUNTER — Encounter: Admission: RE | Admit: 2008-12-30 | Discharge: 2008-12-30 | Payer: Self-pay | Admitting: Internal Medicine

## 2009-01-01 ENCOUNTER — Ambulatory Visit: Payer: Self-pay | Admitting: Endocrinology

## 2009-01-01 ENCOUNTER — Ambulatory Visit: Payer: Self-pay | Admitting: Internal Medicine

## 2009-01-12 ENCOUNTER — Telehealth: Payer: Self-pay | Admitting: Internal Medicine

## 2009-01-12 LAB — CONVERTED CEMR LAB
BUN: 17 mg/dL (ref 6–23)
Basophils Relative: 1 % (ref 0.0–3.0)
Chloride: 102 meq/L (ref 96–112)
Creatinine, Ser: 1.3 mg/dL (ref 0.4–1.5)
Eosinophils Relative: 0.1 % (ref 0.0–5.0)
Glucose, Bld: 229 mg/dL — ABNORMAL HIGH (ref 70–99)
Hemoglobin: 11.9 g/dL — ABNORMAL LOW (ref 13.0–17.0)
Lymphocytes Relative: 28.9 % (ref 12.0–46.0)
MCV: 97.8 fL (ref 78.0–100.0)
Neutro Abs: 3.9 10*3/uL (ref 1.4–7.7)
Neutrophils Relative %: 63.6 % (ref 43.0–77.0)
Potassium: 4.4 meq/L (ref 3.5–5.1)
RBC: 3.56 M/uL — ABNORMAL LOW (ref 4.22–5.81)
WBC: 6.2 10*3/uL (ref 4.5–10.5)

## 2009-01-14 ENCOUNTER — Encounter: Payer: Self-pay | Admitting: Internal Medicine

## 2009-01-21 ENCOUNTER — Ambulatory Visit: Payer: Self-pay | Admitting: Internal Medicine

## 2009-01-26 ENCOUNTER — Telehealth: Payer: Self-pay | Admitting: Internal Medicine

## 2009-01-27 ENCOUNTER — Encounter: Payer: Self-pay | Admitting: Endocrinology

## 2009-01-28 ENCOUNTER — Encounter: Payer: Self-pay | Admitting: Internal Medicine

## 2009-01-29 ENCOUNTER — Telehealth: Payer: Self-pay | Admitting: Internal Medicine

## 2009-01-30 ENCOUNTER — Ambulatory Visit: Payer: Self-pay | Admitting: Internal Medicine

## 2009-01-30 ENCOUNTER — Ambulatory Visit: Payer: Self-pay

## 2009-01-30 ENCOUNTER — Encounter: Payer: Self-pay | Admitting: Cardiovascular Disease

## 2009-01-30 DIAGNOSIS — H34239 Retinal artery branch occlusion, unspecified eye: Secondary | ICD-10-CM

## 2009-02-02 LAB — CONVERTED CEMR LAB
ALT: 23 units/L (ref 0–53)
Alkaline Phosphatase: 72 units/L (ref 39–117)
Basophils Absolute: 0 10*3/uL (ref 0.0–0.1)
CO2: 31 meq/L (ref 19–32)
Creatinine, Ser: 1.1 mg/dL (ref 0.4–1.5)
Eosinophils Absolute: 0 10*3/uL (ref 0.0–0.7)
GFR calc non Af Amer: 70.13 mL/min (ref >60)
HCT: 37.6 % — ABNORMAL LOW (ref 39.0–52.0)
Hemoglobin: 12.6 g/dL — ABNORMAL LOW (ref 13.0–17.0)
Lymphs Abs: 1.8 10*3/uL (ref 0.7–4.0)
MCHC: 33.4 g/dL (ref 30.0–36.0)
MCV: 97.2 fL (ref 78.0–100.0)
Monocytes Absolute: 0.5 10*3/uL (ref 0.1–1.0)
Monocytes Relative: 5.9 % (ref 3.0–12.0)
Neutro Abs: 5.4 10*3/uL (ref 1.4–7.7)
RDW: 15.2 % — ABNORMAL HIGH (ref 11.5–14.6)
Total Bilirubin: 1.1 mg/dL (ref 0.3–1.2)

## 2009-02-03 ENCOUNTER — Encounter: Payer: Self-pay | Admitting: Internal Medicine

## 2009-02-09 ENCOUNTER — Encounter: Payer: Self-pay | Admitting: Internal Medicine

## 2009-02-16 ENCOUNTER — Encounter: Payer: Self-pay | Admitting: Internal Medicine

## 2009-02-16 ENCOUNTER — Ambulatory Visit: Payer: Self-pay | Admitting: Vascular Surgery

## 2009-02-18 ENCOUNTER — Ambulatory Visit (HOSPITAL_COMMUNITY): Admission: RE | Admit: 2009-02-18 | Discharge: 2009-02-18 | Payer: Self-pay | Admitting: Internal Medicine

## 2009-02-18 ENCOUNTER — Telehealth (INDEPENDENT_AMBULATORY_CARE_PROVIDER_SITE_OTHER): Payer: Self-pay | Admitting: *Deleted

## 2009-02-18 ENCOUNTER — Ambulatory Visit: Payer: Self-pay | Admitting: Cardiovascular Disease

## 2009-02-18 ENCOUNTER — Ambulatory Visit: Payer: Self-pay

## 2009-02-18 ENCOUNTER — Encounter: Payer: Self-pay | Admitting: Internal Medicine

## 2009-02-19 ENCOUNTER — Encounter (INDEPENDENT_AMBULATORY_CARE_PROVIDER_SITE_OTHER): Payer: Self-pay | Admitting: *Deleted

## 2009-02-22 ENCOUNTER — Encounter: Payer: Self-pay | Admitting: Internal Medicine

## 2009-03-06 ENCOUNTER — Telehealth: Payer: Self-pay | Admitting: Cardiology

## 2009-03-11 ENCOUNTER — Encounter: Payer: Self-pay | Admitting: Internal Medicine

## 2009-03-17 ENCOUNTER — Ambulatory Visit: Payer: Self-pay | Admitting: Internal Medicine

## 2009-03-17 DIAGNOSIS — I872 Venous insufficiency (chronic) (peripheral): Secondary | ICD-10-CM | POA: Insufficient documentation

## 2009-03-18 ENCOUNTER — Telehealth: Payer: Self-pay | Admitting: Internal Medicine

## 2009-03-19 LAB — CONVERTED CEMR LAB
BUN: 17 mg/dL (ref 6–23)
Chloride: 104 meq/L (ref 96–112)
Potassium: 4.1 meq/L (ref 3.5–5.1)

## 2009-03-23 ENCOUNTER — Telehealth: Payer: Self-pay | Admitting: Endocrinology

## 2009-04-12 ENCOUNTER — Inpatient Hospital Stay (HOSPITAL_COMMUNITY): Admission: EM | Admit: 2009-04-12 | Discharge: 2009-04-14 | Payer: Self-pay | Admitting: Emergency Medicine

## 2009-04-13 ENCOUNTER — Telehealth: Payer: Self-pay | Admitting: Internal Medicine

## 2009-04-14 ENCOUNTER — Encounter (INDEPENDENT_AMBULATORY_CARE_PROVIDER_SITE_OTHER): Payer: Self-pay | Admitting: *Deleted

## 2009-04-27 ENCOUNTER — Ambulatory Visit: Payer: Self-pay | Admitting: Internal Medicine

## 2009-04-27 DIAGNOSIS — IMO0001 Reserved for inherently not codable concepts without codable children: Secondary | ICD-10-CM

## 2009-05-01 ENCOUNTER — Ambulatory Visit: Payer: Self-pay | Admitting: Endocrinology

## 2009-05-01 LAB — CONVERTED CEMR LAB
Creatinine,U: 136.7 mg/dL
Hgb A1c MFr Bld: 7.7 % — ABNORMAL HIGH (ref 4.6–6.5)
Microalb Creat Ratio: 0.7 mg/g (ref 0.0–30.0)
Microalb, Ur: 0.1 mg/dL (ref 0.0–1.9)

## 2009-05-05 LAB — CONVERTED CEMR LAB
Basophils Relative: 0.8 % (ref 0.0–3.0)
Eosinophils Relative: 0.1 % (ref 0.0–5.0)
HCT: 35.6 % — ABNORMAL LOW (ref 39.0–52.0)
Lymphs Abs: 1.5 10*3/uL (ref 0.7–4.0)
MCV: 98.9 fL (ref 78.0–100.0)
Monocytes Absolute: 0.5 10*3/uL (ref 0.1–1.0)
Platelets: 58 10*3/uL — ABNORMAL LOW (ref 150.0–400.0)
Potassium: 4.2 meq/L (ref 3.5–5.1)
RBC: 3.6 M/uL — ABNORMAL LOW (ref 4.22–5.81)
WBC: 6.9 10*3/uL (ref 4.5–10.5)

## 2009-05-22 ENCOUNTER — Telehealth: Payer: Self-pay | Admitting: Internal Medicine

## 2009-05-26 ENCOUNTER — Ambulatory Visit: Payer: Self-pay | Admitting: Internal Medicine

## 2009-06-17 ENCOUNTER — Telehealth: Payer: Self-pay | Admitting: Internal Medicine

## 2009-06-22 ENCOUNTER — Ambulatory Visit: Payer: Self-pay | Admitting: Internal Medicine

## 2009-06-23 ENCOUNTER — Encounter: Payer: Self-pay | Admitting: Internal Medicine

## 2009-06-26 ENCOUNTER — Telehealth: Payer: Self-pay | Admitting: Internal Medicine

## 2009-08-27 ENCOUNTER — Telehealth: Payer: Self-pay | Admitting: Internal Medicine

## 2009-09-01 ENCOUNTER — Telehealth (INDEPENDENT_AMBULATORY_CARE_PROVIDER_SITE_OTHER): Payer: Self-pay | Admitting: *Deleted

## 2009-09-04 ENCOUNTER — Ambulatory Visit: Payer: Self-pay | Admitting: Endocrinology

## 2009-09-16 ENCOUNTER — Telehealth (INDEPENDENT_AMBULATORY_CARE_PROVIDER_SITE_OTHER): Payer: Self-pay | Admitting: *Deleted

## 2009-09-28 ENCOUNTER — Telehealth (INDEPENDENT_AMBULATORY_CARE_PROVIDER_SITE_OTHER): Payer: Self-pay | Admitting: *Deleted

## 2009-10-01 ENCOUNTER — Telehealth (INDEPENDENT_AMBULATORY_CARE_PROVIDER_SITE_OTHER): Payer: Self-pay | Admitting: *Deleted

## 2009-10-20 ENCOUNTER — Ambulatory Visit: Payer: Self-pay | Admitting: Internal Medicine

## 2009-10-20 ENCOUNTER — Telehealth (INDEPENDENT_AMBULATORY_CARE_PROVIDER_SITE_OTHER): Payer: Self-pay | Admitting: *Deleted

## 2009-10-22 ENCOUNTER — Ambulatory Visit: Payer: Self-pay | Admitting: Internal Medicine

## 2009-10-22 LAB — CONVERTED CEMR LAB
AST: 28 units/L (ref 0–37)
Albumin: 3.3 g/dL — ABNORMAL LOW (ref 3.5–5.2)
Alkaline Phosphatase: 52 units/L (ref 39–117)
BUN: 23 mg/dL (ref 6–23)
Eosinophils Absolute: 0 10*3/uL (ref 0.0–0.7)
MCHC: 34.7 g/dL (ref 30.0–36.0)
MCV: 98 fL (ref 78.0–100.0)
Monocytes Absolute: 0.5 10*3/uL (ref 0.1–1.0)
Neutrophils Relative %: 64.7 % (ref 43.0–77.0)
Platelets: 57 10*3/uL — ABNORMAL LOW (ref 150.0–400.0)
Potassium: 4 meq/L (ref 3.5–5.1)
Prothrombin Time: 13 s — ABNORMAL HIGH (ref 9.7–11.8)
Rhuematoid fact SerPl-aCnc: <20 intl units/mL (ref 0–20)
Sodium: 140 meq/L (ref 135–145)
Uric Acid, Serum: 5.2 mg/dL (ref 4.0–7.8)

## 2009-10-23 ENCOUNTER — Inpatient Hospital Stay (HOSPITAL_COMMUNITY)
Admission: AD | Admit: 2009-10-23 | Discharge: 2009-10-28 | Payer: Self-pay | Source: Home / Self Care | Admitting: Internal Medicine

## 2009-10-23 ENCOUNTER — Encounter: Payer: Self-pay | Admitting: Internal Medicine

## 2009-10-23 ENCOUNTER — Encounter: Payer: Self-pay | Admitting: Emergency Medicine

## 2009-10-23 ENCOUNTER — Ambulatory Visit: Payer: Self-pay | Admitting: Internal Medicine

## 2009-10-23 ENCOUNTER — Ambulatory Visit: Payer: Self-pay | Admitting: Diagnostic Radiology

## 2009-10-23 DIAGNOSIS — R188 Other ascites: Secondary | ICD-10-CM

## 2009-10-23 DIAGNOSIS — R4182 Altered mental status, unspecified: Secondary | ICD-10-CM

## 2009-10-27 ENCOUNTER — Telehealth: Payer: Self-pay | Admitting: Internal Medicine

## 2009-10-29 ENCOUNTER — Telehealth: Payer: Self-pay | Admitting: Endocrinology

## 2009-11-06 ENCOUNTER — Telehealth (INDEPENDENT_AMBULATORY_CARE_PROVIDER_SITE_OTHER): Payer: Self-pay | Admitting: *Deleted

## 2009-11-12 ENCOUNTER — Ambulatory Visit: Payer: Self-pay | Admitting: Internal Medicine

## 2009-11-13 ENCOUNTER — Telehealth (INDEPENDENT_AMBULATORY_CARE_PROVIDER_SITE_OTHER): Payer: Self-pay | Admitting: *Deleted

## 2009-11-13 ENCOUNTER — Encounter: Payer: Self-pay | Admitting: Internal Medicine

## 2009-11-18 ENCOUNTER — Ambulatory Visit: Payer: Self-pay | Admitting: Infectious Diseases

## 2009-11-19 ENCOUNTER — Encounter: Payer: Self-pay | Admitting: Internal Medicine

## 2009-12-02 ENCOUNTER — Encounter: Payer: Self-pay | Admitting: Internal Medicine

## 2009-12-02 ENCOUNTER — Encounter: Payer: Self-pay | Admitting: Infectious Diseases

## 2009-12-24 ENCOUNTER — Ambulatory Visit: Payer: Self-pay | Admitting: Infectious Diseases

## 2010-01-08 ENCOUNTER — Ambulatory Visit: Payer: Self-pay | Admitting: Endocrinology

## 2010-03-05 ENCOUNTER — Encounter: Payer: Self-pay | Admitting: Internal Medicine

## 2010-03-14 ENCOUNTER — Encounter: Payer: Self-pay | Admitting: Interventional Radiology

## 2010-03-14 ENCOUNTER — Encounter: Payer: Self-pay | Admitting: Internal Medicine

## 2010-03-16 ENCOUNTER — Telehealth: Payer: Self-pay | Admitting: Internal Medicine

## 2010-03-16 ENCOUNTER — Ambulatory Visit
Admission: RE | Admit: 2010-03-16 | Discharge: 2010-03-16 | Payer: Self-pay | Source: Home / Self Care | Attending: Cardiology | Admitting: Cardiology

## 2010-03-16 ENCOUNTER — Other Ambulatory Visit: Payer: Self-pay | Admitting: Internal Medicine

## 2010-03-16 ENCOUNTER — Ambulatory Visit
Admission: RE | Admit: 2010-03-16 | Discharge: 2010-03-16 | Payer: Self-pay | Source: Home / Self Care | Attending: Internal Medicine | Admitting: Internal Medicine

## 2010-03-16 LAB — CBC WITH DIFFERENTIAL/PLATELET
Basophils Relative: 0.3 % (ref 0.0–3.0)
Eosinophils Relative: 0 % (ref 0.0–5.0)
HCT: 34.6 % — ABNORMAL LOW (ref 39.0–52.0)
MCV: 98.4 fl (ref 78.0–100.0)
Monocytes Relative: 6.9 % (ref 3.0–12.0)
Neutrophils Relative %: 68.2 % (ref 43.0–77.0)
Platelets: 58 10*3/uL — ABNORMAL LOW (ref 150.0–400.0)
RBC: 3.52 Mil/uL — ABNORMAL LOW (ref 4.22–5.81)
WBC: 6 10*3/uL (ref 4.5–10.5)

## 2010-03-16 LAB — COMPREHENSIVE METABOLIC PANEL
Albumin: 3.5 g/dL (ref 3.5–5.2)
BUN: 24 mg/dL — ABNORMAL HIGH (ref 6–23)
CO2: 30 mEq/L (ref 19–32)
Calcium: 9.2 mg/dL (ref 8.4–10.5)
Chloride: 104 mEq/L (ref 96–112)
GFR: 61.45 mL/min (ref >60.00)
Glucose, Bld: 225 mg/dL — ABNORMAL HIGH (ref 70–99)
Potassium: 4.7 mEq/L (ref 3.5–5.1)
Sodium: 140 mEq/L (ref 135–145)
Total Protein: 6.3 g/dL (ref 6.0–8.3)

## 2010-03-16 LAB — PROTIME-INR
INR: 1.3 ratio — ABNORMAL HIGH (ref 0.8–1.0)
Prothrombin Time: 14 s — ABNORMAL HIGH (ref 10.2–12.4)

## 2010-03-17 ENCOUNTER — Ambulatory Visit
Admission: RE | Admit: 2010-03-17 | Discharge: 2010-03-17 | Payer: Self-pay | Source: Home / Self Care | Attending: Infectious Diseases | Admitting: Infectious Diseases

## 2010-03-17 ENCOUNTER — Telehealth (INDEPENDENT_AMBULATORY_CARE_PROVIDER_SITE_OTHER): Payer: Self-pay | Admitting: *Deleted

## 2010-03-18 ENCOUNTER — Ambulatory Visit
Admission: RE | Admit: 2010-03-18 | Discharge: 2010-03-18 | Payer: Self-pay | Source: Home / Self Care | Attending: Internal Medicine | Admitting: Internal Medicine

## 2010-03-18 ENCOUNTER — Ambulatory Visit
Admission: RE | Admit: 2010-03-18 | Discharge: 2010-03-18 | Payer: Self-pay | Source: Home / Self Care | Attending: Infectious Diseases | Admitting: Infectious Diseases

## 2010-03-18 ENCOUNTER — Telehealth: Payer: Self-pay | Admitting: Infectious Diseases

## 2010-03-19 ENCOUNTER — Encounter: Payer: Self-pay | Admitting: Internal Medicine

## 2010-03-23 ENCOUNTER — Encounter: Payer: Self-pay | Admitting: Internal Medicine

## 2010-03-25 LAB — CONVERTED CEMR LAB
Alpha-2-Globulin: 10 % (ref 7.1–11.8)
Beta Globulin: 5.5 % (ref 4.7–7.2)
Gamma Globulin: 15.6 % (ref 11.1–18.8)
Total Protein, Serum Electrophoresis: 6.6 g/dL (ref 6.0–8.3)

## 2010-03-25 NOTE — Progress Notes (Signed)
Summary: refill  Phone Note Refill Request Message from:  Fax from Pharmacy on September 16, 2009 8:56 AM  Refills Requested: Medication #1:  METOPROLOL TARTRATE 25 MG  TABS 1/2 by mouth two times a day  Medication #2:  AMITRIPTYLINE HCL 10 MG TABS 1 tablet at bedtime right source - fax [phone]  Initial call taken by: Okey Regal Spring,  September 16, 2009 8:56 AM  Follow-up for Phone Call        Metoprolol was rx'ed on 09/01/2009, Amitriptyline ws faxed to [phone] Follow-up by: Shonna Chock CMA,  September 16, 2009 10:37 AM    Prescriptions: AMITRIPTYLINE HCL 10 MG TABS (AMITRIPTYLINE HCL) 1 tablet at bedtime  #90 x 1   Entered by:   Shonna Chock CMA   Authorized by:   Marga Melnick MD   Signed by:   Shonna Chock CMA on 09/16/2009   Method used:   Print then Give to Patient   RxID:   [id number]

## 2010-03-25 NOTE — Assessment & Plan Note (Signed)
Summary: FOLLOW UP CIRRHOSIS/SP   History of Present Illness Visit Type: Follow-up Visit Primary GI MD: Stan Head MD [facility] Primary Provider: Marga Melnick, MD  Requesting Provider: n/a Chief Complaint: cirrhosis History of Present Illness:   75 yo wm with alcoholic cirrhosis has been abstinent), portal hypertension s/p TIPS. Having issues with styes and is on minocycline. Once they are cleared may have the cataract surgery. Knee injection recently Otelia Sergeant) helped but short-lved. TIPS stent patent.  He has been busy with projects and has been to he beach some also. Redness in leg at times. Still sleeps alot but no signfiant confusion except slightly disoriented in AM.  He has gained weight, is eating alot.  wife adds to hstory   GI Review of Systems      Denies abdominal pain, acid reflux, belching, bloating, chest pain, dysphagia with liquids, dysphagia with solids, heartburn, loss of appetite, nausea, vomiting, vomiting blood, weight loss, and  weight gain.      Reports liver problems.     Denies anal fissure, black tarry stools, change in bowel habit, constipation, diarrhea, diverticulosis, fecal incontinence, heme positive stool, hemorrhoids, irritable bowel syndrome, jaundice, light color stool, rectal bleeding, and  rectal pain.    Current Medications (verified): 1)  Metoprolol Tartrate 25 Mg  Tabs (Metoprolol Tartrate) .... 1/2 By Mouth Two Times A Day 2)  Isosorbide Mononitrate Cr 60 Mg  Tb24 (Isosorbide Mononitrate) .Marland Kitchen.. 1 By Mouth Once Daily 3)  Diltiazem Hcl Er Beads 180 Mg  Cp24 (Diltiazem Hcl Er Beads) .Marland Kitchen.. 1 By Mouth Qd 4)  Furosemide 40 Mg  Tabs (Furosemide) .Marland Kitchen.. 1 By Mouth Qd 5)  Omeprazole 20 Mg  Tbec (Omeprazole) .... Take 1 Tablet By Mouth Once A Day 6)  Lantus 100 Unit/ml  Soln (Insulin Glargine) .... 20  Units At Bedtime. 7)  Vitamin C 500 Mg Tabs (Ascorbic Acid) .Marland Kitchen.. 1 By Mouth Once Daily 8)  Amitriptyline Hcl 10 Mg Tabs (Amitriptyline Hcl) .Marland Kitchen.. 1 Tablet  At Bedtime 9)  B-1 250 Mg Tabs (Thiamine Hcl) .... 1/2 By Mouth Once Daily 10)  Lactulose 10 Gm/6ml Soln (Lactulose) .... Take 1 and 1/2 Tablespoons Two Times A Day 11)  Freestyle Lancets  Misc (Lancets) .... Test 2-3 Times A Day  Dx 250.02 12)  Freestyle Test  Strp (Glucose Blood) .... Test 2-3 Times A Day Dx 250.02 13)  Spironolactone 50 Mg Tabs (Spironolactone) .... Take 1 Tablet By Mouth Once A Day 14)  Ferrous Sulfate 325 (65 Fe) Mg  Tabs (Ferrous Sulfate) .Marland Kitchen.. 1 By Mouth Bid 15)  Humalog 100 Unit/ml Soln (Insulin Lispro (Human)) .... 40 Units (Just Before Each Meal) 16)  Insulin Syringe 31g X 5/16" 0.5 Ml Misc (Insulin Syringe-Needle U-100) .... Qid 17)  Calcium/magnesium/zinc Formula 1000-500-50 Mg Tabs (Calcium-Magnesium-Zinc) .... Take One Tablet By Mouth Once Daily. 18)  Vitamin E 400 Unit Caps (Vitamin E) .... Once Daily 19)  Talwin 30 Mg/ml Soln (Pentazocine Lactate) .... As Needed For Pain 20)  Xifaxan 550 Mg Tabs (Rifaximin) .... Take 1 Tablet By Mouth Two Times A Day 21)  Allopurinol 300 Mg Tabs (Allopurinol) .... Take 1/2 Tablet Once Daily 22)  Prednisone 5 Mg Tabs (Prednisone) .... Take One By Mouth Once Daily 23)  Vitamin D3 1000 Unit Tabs (Cholecalciferol) .... One Tablet By Mouth Once Daily 24)  Minocycline Hcl 50 Mg Caps (Minocycline Hcl) .... One Capsule By Mouth Two Times A Day 25)  Polyethylene Glycol 3350  Powd (Polyethylene Glycol 3350) .Marland KitchenMarland KitchenMarland Kitchen 1  1/2 Tablespoons in Liquid Two Times A Day  Allergies (verified): 1)  ! Sulfa 2)  ! * Dilaudid 3)  ! Codeine 4)  ! Morphine 5)  ! * Shellfish 6)  ! Demerol 7)  ! Zetia (Ezetimibe) 8)  ! Augmentin 9)  Morphine Sulfate (Morphine Sulfate) 10)  Oxycontin (Oxycodone Hcl) 11)  Sulfamethoxazole (Sulfamethoxazole) 12)  Codeine Phosphate (Codeine Phosphate)  Past History:  Past Medical History: Reviewed history from 05/26/2009 and no changes required. Allergic rhinitis GERD Osteoarthritis Alcoholic cirrhosis,  Thrombocytopenia, Bleeding Esophageal Varices, s/p TIPS 2/10, encephalopathy Anemia, multifactorial Internal Hemorrhoids Colon Polyps adenomatous Diverticulosis Gastric Antral Vascular Ectasia Rhinophyma Melanoma resected by Moh's 2008 Diabetes, Type 2 CAD....myoview  2007  no ischemia / no ASA because of GI disease LV function .Marland Kitchen..echo September, 2010.Marland Kitchen EF 55%.. mild AI... aortic valve sclerosis Hypertension CABG...2004 Cellulitis RLE 10/2008 Sylva ,[place] Atrial Fibrillation...amiodarone in past, but problems and left off meds. Gout Sleep Apnea.Marland KitchenMarland KitchenCPAP Carotid bruits???  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 Colonoscopy 2005 Dr Leone Payor, 12/08 also 6 Bypass Total knee replacement- right knee EGD 12/08, 12/09, 2/10 TIPS 2/10 laminectomy x 4  hemorrhoidectomy inguinal herniograpphy contracture repair-bilaterally on hands right arm surgery hiatal hernia repair  Family History: Reviewed history from 08/29/2008 and no changes required. Father: CA GI (stomach ???) Mother: Alzheimer's Siblings: MIs,CVAs, CA (melanoma) Family History of Diabetes: Brothers Family History of Heart Disease: 3 Brothers, 2 sisters Family History of Liver Disease/Cirrhosis:brother, sister No FH of Colon Cancer:  Social History: Reviewed history from 09/30/2008 and no changes required. Alcohol Use - None since Feb. 2010 Illicit Drug Use - no Patient does not get regular exercise.  Patient is a former smoker. -stopped over 40 years ago Occupation: retired married  Review of Systems       see HPIalso back and joint pains persist but not as svere as in past (back)  Vital Signs:  Patient profile:   75 year old male Height:      76 inches Weight:      245 pounds BMI:     29.93 BSA:     2.42 Pulse rate:    60 / minute Pulse rhythm:   regular BP sitting:   122 / 60  (left arm) Cuff size:   regular  Vitals Entered By: Ok Anis CMA (October 22, 2009 9:30 AM)  Physical Exam  General:  obese.  no distress chronically ill  Eyes:  anicteric Lungs:  Clear throughout to auscultation. Heart:  Regular rate and rhythm; no murmurs, rubs,  or bruits. Abdomen:  soft, non-tender, BS + medium ventral hernia/disatsis recti  and small umbilical hernia firm liver palable RUQ no splenomegaly ? ascites Extremities:  3+ RLE edema to upper pre-tibial 2+ LLE edema to upper pre-tibial slight erythema right, no warmth Neurologic:  no asterixis a&o x 4   Impression & Recommendations:  Problem # 1:  CIRRHOSIS, ALCOHOLIC (ICD-571.2) Assessment Unchanged seems stable, overall much iproved with respect to enjoying and participatinung in life despite illness ? ascites TIPS apparently patent, will obtain report of doppler US he may need more diuretics  he and wife reminded to obtain influenza vaccine in Fall and sounds like he has had a Pneumovax within the last 5 years  Problem # 2:  HEPATIC ENCEPHALOPATHY (ICD-572.2) Assessment: Unchanged NH3 elevated clinicallystable and ok no changes  Problem #  3:  GERD (ICD-530.81) Assessment: Unchanged under control  Problem # 4:  COLONIC POLYPS, ADENOMATOUS, HX OF (ICD-V12.72) Assessment: Comment Only due for routne screening 12/11, high threshold to contnue this in face of comorbidities  Patient Instructions: 1)  Copy sent to : Vira Browns, MD; Nelson Chimes, MD, Ruel Favors, MD 2)  Your ultrasound is scheduled for 10/28/09 at [facility].  See seperate instructions. 3)  We will call you with further follow up after reviewing these results. 4)  We will obtain report for last TIPS check. 5)  The medication list was reviewed and reconciled.  All changed / newly prescribed medications were explained.  A complete medication list was provided to the patient  / caregiver. Patient: [name] Note: All result statuses are Final unless otherwise noted.  Tests: (1) CBC Platelet w/Diff (CBCD)   White Cell Count          5.7 K/uL                    4.5-10.5   Red Cell Count       [L]  3.43 Mil/uL                 4.22-5.81   Hemoglobin           [L]  11.6 g/dL                   16.1-09.6   Hematocrit           [L]  33.6 %                      39.0-52.0   MCV                       98.0 fl                     78.0-100.0   MCHC                      34.7 g/dL                   04.5-40.9   RDW                  [H]  15.8 %                      11.5-14.6   Platelet Count       [L]  57.0 K/uL                   150.0-400.0     Rechecked and verified result.   Neutrophil %              64.7 %                      43.0-77.0   Lymphocyte %              26.8 %                      12.0-46.0   Monocyte %                8.3 %                       3.0-12.0   Eosinophils%  0.0 %                       0.0-5.0   Basophils %               0.2 %                       0.0-3.0   Neutrophill Absolute      3.7 K/uL                    1.4-7.7   Lymphocyte Absolute       1.5 K/uL                    0.7-4.0   Monocyte Absolute         0.5 K/uL                    0.1-1.0  Eosinophils, Absolute                             0.0 K/uL                    0.0-0.7   Basophils Absolute        0.0 K/uL                    0.0-0.1  Tests: (2) CMP (COMP)   Sodium                    140 mEq/L                   135-145   Potassium                 4.0 mEq/L                   3.5-5.1   Chloride                  105 mEq/L                   96-112   Carbon Dioxide            28 mEq/L                    19-32   Glucose              [H]  242 mg/dL                   81-19   BUN                       23 mg/dL                    1-47   Creatinine                1.5 mg/dL                   8.2-9.5   Total Bilirubin           1.2 mg/dL                   6.2-1.3   Alkaline Phosphatase       52 U/L  39-117   AST                       28 U/L                      0-37   ALT                       15 U/L                      0-53   Total Protein        [L]  5.7 g/dL                    8.4-1.3   Albumin              [L]  3.3 g/dL                    2.4-4.0   Calcium                   9.5 mg/dL                   1.0-27.2   GFR                       50.08 mL/min                >60  Tests: (3) Ammonia (AMON)   Ammonia              [H]  85 umol/L                   11-35     Rechecked and verified result.  Tests: (4) PT (PTP)   Prothrombin Time     [H]  13.0 sec                    9.7-11.8   INR                  [H]  1.2 ratio                   0.8-1.0  Tests: (5) Uric Acid (URIC)   Uric Acid                 5.2 mg/dL                   5.3-6.6  Note: An exclamation mark (!) indicates a result that was not dispersed into the flowsheet. Document Creation Date: 10/20/2009 12:27 PM  Appended Document: FOLLOW UP CIRRHOSIS/SP immue to HAV not to Hep B not at high-risk but standard is to vaccinate for Hepatitis B so will have this done.  Appended Document: Orders Update Clinical Lists Changes  Orders: Added new Test order of Ultrasound Abdomen (UAS) - Signed      Appended Document: FOLLOW UP CIRRHOSIS/SP pt admitted on 9/2, I will wait until pt discharged to notify regarding hep B vaccine.  Appended Document: Hep B Vaccines Clinical Lists Changes  Observations: Added new observation of HEPBVAX#3: Historical (03/19/2007 15:20) Added new observation of HEPBVAX#2: Historical (10/03/2006 15:20) Added new observation of HEPBVAX#1: Historical (08/16/2006 15:20)       Immunization History:  Hepatitis B Immunization History:    Hepatitis B # 1:  historical (08/16/2006)    Hepatitis B # 2:  historical (10/03/2006)    Hepatitis B # 3:  historical (03/19/2007)  Follow up of pt's Hep B vaccines shows that he was vaccinated in 2008-2009.  Has pt's  immunity been checked since April 2008?  Does pt need to be re-vaccinated.? Francee Piccolo CMA Duncan Dull)  November 10, 2009 3:22 PM   No - I had missed this Thanks Iva Boop MD, [facility]  November 10, 2009 10:56 PM

## 2010-03-25 NOTE — Progress Notes (Signed)
Summary: REFILL REQUEST  Phone Note Refill Request Call back at [phone] Message from:  Pharmacy on September 01, 2009 4:58 PM  Refills Requested: Medication #1:  METOPROLOL TARTRATE 25 MG  TABS 1/2 by mouth two times a day   Dosage confirmed as above?Dosage Confirmed   Supply Requested: 3 months RIGHT SOURCE  Initial call taken by: Lavell Islam,  September 01, 2009 4:59 PM  Follow-up for Phone Call        RX was faxed to :[phone] Follow-up by: Shonna Chock CMA,  September 02, 2009 10:08 AM    Prescriptions: METOPROLOL TARTRATE 25 MG  TABS (METOPROLOL TARTRATE) 1/2 by mouth two times a day  #90 x 2   Entered by:   Shonna Chock CMA   Authorized by:   Marga Melnick MD   Signed by:   Shonna Chock CMA on 09/02/2009   Method used:   Print then Give to Patient   RxID:   [id number]

## 2010-03-25 NOTE — Progress Notes (Signed)
Summary: REFILL REQUEST  Phone Note Refill Request Call back at [phone] Message from:  Pharmacy on October 01, 2009 8:08 AM  Refills Requested: Medication #1:  METOPROLOL TARTRATE 25 MG  TABS 1/2 by mouth two times a day   Dosage confirmed as above?Dosage Confirmed   Supply Requested: 3 months RIGHT SOURCE  Initial call taken by: Lavell Islam,  October 01, 2009 8:09 AM  Follow-up for Phone Call        RX faxed to : [phone] Follow-up by: Shonna Chock CMA,  October 01, 2009 8:25 AM    Prescriptions: METOPROLOL TARTRATE 25 MG  TABS (METOPROLOL TARTRATE) 1/2 by mouth two times a day  #90 x 2   Entered by:   Shonna Chock CMA   Authorized by:   Marga Melnick MD   Signed by:   Shonna Chock CMA on 10/01/2009   Method used:   Print then Give to Patient   RxID:   [id number]

## 2010-03-25 NOTE — Assessment & Plan Note (Signed)
Summary: POST HOSPITAL/[facility]   Vital Signs:  Patient profile:   75 year old male Weight:      230.6 pounds Temp:     98.2 degrees F oral Pulse rate:   60 / minute Resp:     17 per minute BP sitting:   102 / 60  (left arm) Cuff size:   large  Vitals Entered By: Shonna Chock (April 27, 2009 11:42 AM) CC: Hospital Follow-up: patient with increased shakes and cramps in hands/legs.  Comments REVIEWED MED LIST, PATIENT AGREED DOSE AND INSTRUCTION CORRECT    Primary Care Provider:  Marga Melnick, MD  CC:  Hospital Follow-up: patient with increased shakes and cramps in hands/legs. .  History of Present Illness: Since D/C he has been asymptomatic for cramps in hands & thighs for > 4 weeks; fingers actually draw. He is on Ca/Mg/ Zinc daily.Rx: walking & Talwin. FBS 1390-244 since D/C; no hypoglycemia. Appt with Dr Everardo All 05/01/2009. Dr Dierdre Forth Rxed 5 mg of Prednisone once daily for arthritis. "Small " Ophth stroke as per Dr Tedra Coupe, [facility].Platelet count low in hospital in 12/2008, not rechecked  Allergies: 1)  ! Sulfa 2)  ! * Dilaudid 3)  ! Codeine 4)  ! Morphine 5)  ! * Shellfish 6)  ! Demerol 7)  ! Zetia (Ezetimibe) 8)  ! Augmentin 9)  Morphine Sulfate (Morphine Sulfate) 10)  Oxycontin (Oxycodone Hcl) 11)  Sulfamethoxazole (Sulfamethoxazole) 12)  Codeine Phosphate (Codeine Phosphate)  Review of Systems Eyes:  Complains of vision loss-1 eye; denies blurring and double vision; Blind spot OD; improving. MS:  Complains of low back pain, muscle aches, cramps, and muscle weakness; denies joint redness and joint swelling.  Physical Exam  General:  in no acute distress; alert,appropriate and cooperative throughout examination Extremities:  Mixed arthritic hand changes. Fusiform knees w/o effusion. Support hose in place. Neurologic:  Coarse tremor RUE > LUE w/o asterixis Psych:  Oriented X3, flat affect, subdued.     Impression & Recommendations:  Problem # 1:   MUSCLE PAIN (ICD-729.1)  His updated medication list for this problem includes:    Pentazocine-naloxone Hcl 50-0.5 Mg Tabs (Pentazocine-naloxone) ..... Uad    Talwin 30 Mg/ml Soln (Pentazocine lactate) .Marland Kitchen... As needed for pain  Orders: Venipuncture (16109) TLB-Calcium (82310-CA) TLB-CK Total Only(Creatine Kinase/CPK) (82550-CK) TLB-Magnesium (Mg) (83735-MG) TLB-Potassium (K+) (84132-K)  Problem # 2:  THROMBOCYTOPENIA, CHRONIC (ICD-287.5)  PMH of  Orders: Venipuncture (60454) TLB-CBC Platelet - w/Differential (85025-CBCD)  Problem # 3:  OSTEOARTHRITIS (ICD-715.90)  His updated medication list for this problem includes:    Pentazocine-naloxone Hcl 50-0.5 Mg Tabs (Pentazocine-naloxone) ..... Uad    Talwin 30 Mg/ml Soln (Pentazocine lactate) .Marland Kitchen... As needed for pain  Orders: Venipuncture (09811) TLB-Uric Acid, Blood (84550-URIC)  Problem # 4:  GOUT NOS (ICD-274.9)  His updated medication list for this problem includes:    Allopurinol 300 Mg Tabs (Allopurinol) .Marland Kitchen... Take 1/2 tablet once daily  Orders: TLB-Uric Acid, Blood (84550-URIC)  Complete Medication List: 1)  Metoprolol Tartrate 25 Mg Tabs (Metoprolol tartrate) .... 1/2 by mouth two times a day 2)  Isosorbide Mononitrate Cr 60 Mg Tb24 (Isosorbide mononitrate) .Marland Kitchen.. 1 by mouth once daily 3)  Diltiazem Hcl Er Beads 180 Mg Cp24 (Diltiazem hcl er beads) .Marland Kitchen.. 1 by mouth qd 4)  Furosemide 40 Mg Tabs (Furosemide) .Marland Kitchen.. 1 by mouth qd 5)  Omeprazole 20 Mg Tbec (Omeprazole) .... Take 1 tablet by mouth two times a day 6)  Lantus 100 Unit/ml Soln (Insulin  glargine) .... 25  units at bedtime. 7)  Vitamin C 500 Mg Tabs (Ascorbic acid) .Marland Kitchen.. 1 by mouth once daily 8)  Amitriptyline Hcl 10 Mg Tabs (Amitriptyline hcl) .Marland Kitchen.. 1 tablet at bedtime 9)  B-1 250 Mg Tabs (Thiamine hcl) .... 1/2 by mouth once daily 10)  Lactulose Soln (Lactulose) .... Take 1 1/2  tablespoon by mouth three times a day 11)  Freestyle Lancets Misc (Lancets) ....  Test 2-3 times a day  dx 250.02 12)  Freestyle Test Strp (Glucose blood) .... Test 2-3 times a day dx 250.02 13)  Spironolactone 50 Mg Tabs (Spironolactone) .... Take 1 tablet by mouth once a day 14)  Ferrous Sulfate 325 (65 Fe) Mg Tabs (Ferrous sulfate) .Marland Kitchen.. 1 by mouth bid 15)  Humalog 100 Unit/ml Soln (Insulin lispro (human)) .... 30 units (just before each meal) 16)  Insulin Syringe 31g X 5/16" 0.5 Ml Misc (Insulin syringe-needle u-100) .... Qid 17)  Calcium/magnesium/zinc Formula 1000-500-50 Mg Tabs (Calcium-magnesium-zinc) .... Take one tablet by mouth once daily. 18)  Pentazocine-naloxone Hcl 50-0.5 Mg Tabs (Pentazocine-naloxone) .... Uad 19)  Vitamin E 400 Unit Caps (Vitamin e) .... Once daily 20)  Talwin 30 Mg/ml Soln (Pentazocine lactate) .... As needed for pain 21)  Xifaxan 550 Mg Tabs (Rifaximin) .... Take 1 tablet by mouth two times a day 22)  Allopurinol 300 Mg Tabs (Allopurinol) .... Take 1/2 tablet once daily 23)  Prednisone 5 Mg Tabs (Prednisone) .... Take one by mouth once daily 24)  Vitamin D3 2000 Unit Caps (Cholecalciferol) .Marland Kitchen.. 1 by mouth once daily  Patient Instructions: 1)  Check your blood sugars regularly. If your readings are usually above : 180 or below 90 you should contact  Dr Everardo All

## 2010-03-25 NOTE — Letter (Signed)
Summary: [facility]   Imported By: Lester [place] 03/05/2009 12:30:43  _____________________________________________________________________  External Attachment:    Type:   Image     Comment:   External Document

## 2010-03-25 NOTE — Progress Notes (Signed)
Summary: cramping no better  Phone Note Call from Patient   Caller: Spouse Summary of Call: Pt wife states that pt was seen on 04-27-09 for cramping in hands and legs. pt wife states that nothing was rx for this and pt continue to have cramping. Pt would like to get a med rx.pt uses [facility]. pls advise...................Marland KitchenFelecia Deloach CMA  June 17, 2009 10:28 AM  Initial call taken by: Jeremy Johann CMA,  June 17, 2009 10:24 AM  Follow-up for Phone Call        per dr hopper check to see if following labs were ordered mg,ca,k,cpk,vit-d, 729.1................Marland KitchenFelecia Deloach CMA  June 17, 2009 12:23 PM   pt had all labs done except vit-d 729.1, pt wife informed will have pt to call to schedule appt later.............Marland KitchenFelecia Deloach CMA  June 17, 2009 12:25 PM

## 2010-03-25 NOTE — Progress Notes (Signed)
Summary: 30 DAY SPIRONOLACTONE RX  Phone Note Call from Patient Call back at Home Phone [phone]   Caller: Patient Call For: Dr. Leone Payor Reason for Call: Talk to Nurse Summary of Call: pt. needs a script for Spironolactone 50mg . called into [facility] [phone]...Marland Kitchenwent on vacation and forgot to mail script for mail order Initial call taken by: Karna Christmas,  August 27, 2009 2:05 PM  Follow-up for Phone Call        I spoke to [facility], advised I will call in meds for one month until mail-order arrives.  Appt scheduled for 10/22/09 to see Dr. Leone Payor.  Pt will come in on 8/30 for CBCD, CMET, AMMONIA, PT.  order in IDX Follow-up by: Francee Piccolo CMA Duncan Dull),  August 27, 2009 2:58 PM    Prescriptions: SPIRONOLACTONE 50 MG TABS (SPIRONOLACTONE) Take 1 tablet by mouth once a day  #30 x 1   Entered by:   Francee Piccolo CMA (AAMA)   Authorized by:   Iva Boop MD, [facility]   Signed by:   Francee Piccolo CMA (AAMA) on 08/27/2009   Method used:   Electronically to        [facility] [address] (retail)       [address], [address]       [place], [place]  [address]       Ph: [phone]       Fax: [phone]   RxID:   [id number]

## 2010-03-25 NOTE — Assessment & Plan Note (Signed)
Summary: 4 MTH FU  STC   Vital Signs:  Patient profile:   75 year old male Height:      76 inches (193.04 cm) Weight:      237 pounds (107.73 kg) BMI:     28.95 O2 Sat:      97 % on Room air Temp:     97.5 degrees F (36.39 degrees C) oral Pulse rate:   52 / minute BP sitting:   112 / 60  (left arm) Cuff size:   regular  Vitals Entered By: Brenton Grills MA (September 04, 2009 10:03 AM)  O2 Flow:  Room air CC: 4 mo F/U/aj Is Patient Diabetic? Yes   Referring Provider:  n/a Primary Provider:  Marga Melnick, MD  CC:  4 mo F/U/aj.  History of Present Illness: he brings a record of his cbg's which i have reviewed today.  it varies from 100's-200's.  it is in general higher as the day goes on.  he eats only 2 meals per day, most days.    Current Medications (verified): 1)  Metoprolol Tartrate 25 Mg  Tabs (Metoprolol Tartrate) .... 1/2 By Mouth Two Times A Day 2)  Isosorbide Mononitrate Cr 60 Mg  Tb24 (Isosorbide Mononitrate) .Marland Kitchen.. 1 By Mouth Once Daily 3)  Diltiazem Hcl Er Beads 180 Mg  Cp24 (Diltiazem Hcl Er Beads) .Marland Kitchen.. 1 By Mouth Qd 4)  Furosemide 40 Mg  Tabs (Furosemide) .Marland Kitchen.. 1 By Mouth Qd 5)  Omeprazole 20 Mg  Tbec (Omeprazole) .... Take 1 Tablet By Mouth Once A Day 6)  Lantus 100 Unit/ml  Soln (Insulin Glargine) .... 25  Units At Bedtime. 7)  Vitamin C 500 Mg Tabs (Ascorbic Acid) .Marland Kitchen.. 1 By Mouth Once Daily 8)  Amitriptyline Hcl 10 Mg Tabs (Amitriptyline Hcl) .Marland Kitchen.. 1 Tablet At Bedtime 9)  B-1 250 Mg Tabs (Thiamine Hcl) .... 1/2 By Mouth Once Daily 10)  Lactulose  Soln (Lactulose) .... Take 1 1/2  Tablespoon By Mouth Three Times A Day 11)  Freestyle Lancets  Misc (Lancets) .... Test 2-3 Times A Day  Dx 250.02 12)  Freestyle Test  Strp (Glucose Blood) .... Test 2-3 Times A Day Dx 250.02 13)  Spironolactone 50 Mg Tabs (Spironolactone) .... Take 1 Tablet By Mouth Once A Day 14)  Ferrous Sulfate 325 (65 Fe) Mg  Tabs (Ferrous Sulfate) .Marland Kitchen.. 1 By Mouth Bid 15)  Humalog 100 Unit/ml Soln  (Insulin Lispro (Human)) .... 35 Units (Just Before Each Meal) 16)  Insulin Syringe 31g X 5/16" 0.5 Ml Misc (Insulin Syringe-Needle U-100) .... Qid 17)  Calcium/magnesium/zinc Formula 1000-500-50 Mg Tabs (Calcium-Magnesium-Zinc) .... Take One Tablet By Mouth Once Daily. 18)  Pentazocine-Naloxone Hcl 50-0.5 Mg Tabs (Pentazocine-Naloxone) .... Uad 19)  Vitamin E 400 Unit Caps (Vitamin E) .... Once Daily 20)  Talwin 30 Mg/ml Soln (Pentazocine Lactate) .... As Needed For Pain 21)  Xifaxan 550 Mg Tabs (Rifaximin) .... Take 1 Tablet By Mouth Two Times A Day 22)  Allopurinol 300 Mg Tabs (Allopurinol) .... Take 1/2 Tablet Once Daily 23)  Prednisone 5 Mg Tabs (Prednisone) .... Take One By Mouth Once Daily 24)  Vitamin D3 2000 Unit Caps (Cholecalciferol) .Marland Kitchen.. 1 By Mouth Once Daily  Allergies (verified): 1)  ! Sulfa 2)  ! * Dilaudid 3)  ! Codeine 4)  ! Morphine 5)  ! * Shellfish 6)  ! Demerol 7)  ! Zetia (Ezetimibe) 8)  ! 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 mild AI... aortic valve sclerosis Hypertension CABG...2004 Cellulitis RLE 10/2008 Sylva ,[place] Atrial Fibrillation...amiodarone in past, but problems and left off meds. Gout Sleep Apnea.Marland KitchenMarland KitchenCPAP Carotid bruits???  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    Physical Exam  General:  obese.  no distress  Msk:  gait is steady with a cane Extremities:  (sees podiatry) Additional Exam:  Hemoglobin A1C       [H]  8.2 %   Impression & Recommendations:  Problem # 1:  DIABETES MELLITUS, TYPE II, UNCONTROLLED (ICD-250.02) needs increased rx  Medications Added to Medication List This Visit: 1)  Lantus 100 Unit/ml Soln (Insulin glargine) .... 20  units at bedtime. 2)  Humalog 100 Unit/ml Soln (Insulin lispro (human)) .... 40 units (just before each meal)  Other Orders: TLB-A1C / Hgb A1C (Glycohemoglobin) (83036-A1C) Est. Patient Level III (84696)  Patient Instructions: 1)  tests are being ordered for you today.  a few days after the test(s), please call [phone] to hear your test results. 2)  pending the test results, please increase humalog to 40 units just before each meal, and reduce lantus to 20 units per day. 3)  check your blood sugar 2 times a day.  vary the time of day when you check, between before the 3 meals, and at bedtime.  also check if you have symptoms of your blood sugar being too high or too low.  please keep a record of the readings and bring it to your next appointment here.  please call us sooner if you are having low blood sugar episodes. 4)  Please schedule a follow-up appointment in 4 months. 5)  (update: i left message on phone-tree:  rx as we discussed) Prescriptions: HUMALOG 100 UNIT/ML SOLN (INSULIN LISPRO (HUMAN)) 40 units (just before each meal)  #4 vials x 11   Entered and Authorized by:   Minus Breeding MD   Signed by:   Minus Breeding MD on 09/04/2009   Method used:   Print then Give to Patient   RxID:   [id number]

## 2010-03-25 NOTE — Progress Notes (Signed)
Summary: Moviprep Prescription  Phone Note From Pharmacy   Caller: [facility] [phone] Call For: Dr. Leone Payor  Reason for Call: Cannot read prescription Summary of Call: Pharmacy has questions about the Moviprep prescription from 02-25-09 Initial call taken by: Karna Christmas,  March 18, 2009 11:12 AM  Follow-up for Phone Call        [facility] instructions verified with [employer] [employer] tech and Kirt Boys, pharmacist.  Moviprep in first message was error on pharmacy side. Follow-up by: Francee Piccolo CMA Duncan Dull),  March 18, 2009 2:21 PM

## 2010-03-25 NOTE — Letter (Signed)
Summary: [facility]   Imported By: Sherian Rein 06/01/2009 13:28:48  _____________________________________________________________________  External Attachment:    Type:   Image     Comment:   External Document

## 2010-03-25 NOTE — Assessment & Plan Note (Signed)
Summary: hosp f/u celulitis/cbs   Vital Signs:  Patient profile:   75 year old male Weight:      240.2 pounds Temp:     98.8 degrees F oral Pulse rate:   60 / minute Resp:     18 per minute BP sitting:   110 / 62  (left arm) Cuff size:   large  Vitals Entered By: Shonna Chock CMA (November 12, 2009 11:35 AM) CC: Hospital follow-up: Cellulitis, order for labs and blood cultures for home health Comments Faxed OV to [phone], [facility]   Primary Care Provider:  Marga Melnick, MD   CC:  Hospital follow-up: Cellulitis and order for labs and blood cultures for home health.  History of Present Illness: Since D/C he has had residual soreness to touch in R shin with intermittent edema of ankle. No constitutional symptoms.He completed 2 weeks of Levaquin 750 mg daily IV as of 09/21. He has appt with Dr Ninetta Lights 09/28. He is on Prednisone 5 mg once daily from  Dr Dierdre Forth for joint pain. FBS 74-140. Two hrs after eve meal , glucose 150- 340. Concepts of portion control & glycemic index & load discussed.Glucose goals discussed.  Allergies: 1)  ! Sulfa 2)  ! * Dilaudid 3)  ! Codeine 4)  ! Morphine 5)  ! * Shellfish 6)  ! Demerol 7)  ! Zetia (Ezetimibe) 8)  ! Augmentin 9)  Morphine Sulfate (Morphine Sulfate) 10)  Oxycontin (Oxycodone Hcl) 11)  Sulfamethoxazole (Sulfamethoxazole) 12)  Codeine Phosphate (Codeine Phosphate)  Review of Systems General:  Denies chills, fever, and sweats. Eyes:  Sty inactive.  Physical Exam  General:  in no acute distress; alert,appropriate and cooperative throughout examination Lungs:  Normal respiratory effort, chest expands symmetrically. Lungs are clear to auscultation, no crackles or wheezes. Heart:  normal rate, regular rhythm, no gallop, no rub, no JVD, and grade 1.5 /6 systolic murmur.   Pulses:  R and L dorsalis pedis are full and equal bilaterally. Decreased PTP.Venous stasis ; support hose worn Extremities:  1+ right pedal edema.    Marked fungal nail changes( seeing Dr Forest Becker every 3 months).  Skin:  Faint erythema R shin w/o frank cellulitis or temp change Psych:  normally interactive and good eye contact.     Impression & Recommendations:  Problem # 1:  CELLULITIS, LEG, RIGHT (ICD-682.6) Clinically inactive His updated medication list for this problem includes:    Xifaxan 550 Mg Tabs (Rifaximin) .Marland Kitchen... Take 1 tablet by mouth two times a day    Minocycline Hcl 50 Mg Caps (Minocycline hcl) ..... One capsule by mouth two times a day    Levaquin 750 Mg/157ml Soln (Levofloxacin in d5w) ..... Iv/pic line: Completed 11/11/2009  Problem # 2:  DIABETES MELLITUS, TYPE II, UNCONTROLLED (ICD-250.02) as per Dr Everardo All His updated medication list for this problem includes:    Lantus 100 Unit/ml Soln (Insulin glargine) .Marland Kitchen... 20  units at bedtime.    Humalog 100 Unit/ml Soln (Insulin lispro (human)) .Marland KitchenMarland KitchenMarland KitchenMarland Kitchen 40 units (just before each meal)  Complete Medication List: 1)  Metoprolol Tartrate 25 Mg Tabs (Metoprolol tartrate) .... 1/2 by mouth two times a day 2)  Isosorbide Mononitrate Cr 60 Mg Tb24 (Isosorbide mononitrate) .Marland Kitchen.. 1 by mouth once daily 3)  Diltiazem Hcl Er Beads 180 Mg Cp24 (Diltiazem hcl er beads) .Marland Kitchen.. 1 by mouth qd 4)  Furosemide 40 Mg Tabs (Furosemide) .Marland Kitchen.. 1 by mouth qd 5)  Omeprazole 20 Mg Tbec (Omeprazole) .... Take 1 tablet  by mouth once a day 6)  Lantus 100 Unit/ml Soln (Insulin glargine) .... 20  units at bedtime. 7)  Vitamin C 500 Mg Tabs (Ascorbic acid) .Marland Kitchen.. 1 by mouth once daily 8)  Amitriptyline Hcl 10 Mg Tabs (Amitriptyline hcl) .Marland Kitchen.. 1 tablet at bedtime 9)  B-1 250 Mg Tabs (Thiamine hcl) .... 1/2 by mouth once daily 10)  Lactulose 10 Gm/66ml Soln (Lactulose) .... Take 1 and 1/2 tablespoons two times a day 11)  Freestyle Lancets Misc (Lancets) .... Test 2-3 times a day  dx 250.02 12)  Freestyle Test Strp (Glucose blood) .... Test 2-3 times a day dx 250.02 13)  Spironolactone 50 Mg Tabs  (Spironolactone) .... Take 1 tablet by mouth once a day 14)  Ferrous Sulfate 325 (65 Fe) Mg Tabs (Ferrous sulfate) .Marland Kitchen.. 1 by mouth bid 15)  Humalog 100 Unit/ml Soln (Insulin lispro (human)) .... 40 units (just before each meal) 16)  Insulin Syringe 31g X 5/16" 0.5 Ml Misc (Insulin syringe-needle u-100) .... Qid 17)  Calcium/magnesium/zinc Formula 1000-500-50 Mg Tabs (Calcium-magnesium-zinc) .... Take one tablet by mouth once daily. 18)  Vitamin E 400 Unit Caps (Vitamin e) .... Once daily 19)  Talwin 30 Mg/ml Soln (Pentazocine lactate) .... As needed for pain 20)  Xifaxan 550 Mg Tabs (Rifaximin) .... Take 1 tablet by mouth two times a day 21)  Allopurinol 300 Mg Tabs (Allopurinol) .... Take 1/2 tablet once daily 22)  Prednisone 5 Mg Tabs (Prednisone) .... Take one by mouth once daily 23)  Vitamin D3 1000 Unit Tabs (Cholecalciferol) .... Two tablet by mouth once daily 24)  Minocycline Hcl 50 Mg Caps (Minocycline hcl) .... One capsule by mouth two times a day 25)  Polyethylene Glycol 3350 Powd (Polyethylene glycol 3350) .Marland Kitchen.. 1 1/2 tablespoons in liquid two times a day 26)  Levaquin 750 Mg/120ml Soln (Levofloxacin in d5w) .... Iv/pic line 27)  Oxycodone Hcl 5 Mg Tabs (Oxycodone hcl) .Marland Kitchen.. 1 by mouth every 4 hours as needed  Other Orders: Flu Vaccine 1yrs + MEDICARE PATIENTS (Z6109) Administration Flu vaccine - MCR (U0454) Flu Vaccine Consent Questions     Do you have a history of severe allergic reactions to this vaccine? no    Any prior history of allergic reactions to egg and/or gelatin? no    Do you have a sensitivity to the preservative Thimersol? no    Do you have a past history of Guillan-Barre Syndrome? no    Do you currently have an acute febrile illness? no    Have you ever had a severe reaction to latex? no    Vaccine information given and explained to patient? yes    Are you currently pregnant? no    Lot Number:[id number]   Exp Date:08/21/2010   Site Given  Left Deltoid IMation Flu  vaccine - MCR (U9811)  Patient Instructions: 1)  Blood Cultures X 2 by Genevieve Norlander. 2)  Check your blood sugars regularly. Your  goals are  90-150  FASTING   & < 180 two hrs after a meal. Portion control & glycemic index/ load interventions as discused. Marland Kitchenlbmedflu

## 2010-03-25 NOTE — Assessment & Plan Note (Signed)
Summary: 4 MTH FU  STC   Vital Signs:  Patient profile:   75 year old male Height:      76 inches (193.04 cm) Weight:      249 pounds (113.18 kg) BMI:     30.42 O2 Sat:      97 % on Room air Temp:     98.1 degrees F (36.72 degrees C) oral Pulse rate:   59 / minute BP sitting:   130 / 70  (left arm) Cuff size:   large  Vitals Entered By: Brenton Grills CMA Duncan Dull) (January 08, 2010 9:21 AM)  O2 Flow:  Room air CC: Follow-up visit/pt is no longer taking Oxycodone/aj Is Patient Diabetic? Yes   Referring Yalonda Sample:  na  Primary Kitrina Maurin:  Marga Melnick, MD   CC:  Follow-up visit/pt is no longer taking Oxycodone/aj.  History of Present Illness: pt states he feels well in general.  he brings a record of his cbg's which i have reviewed today.  most are checked in am, but there are a few at other times of day.  it varies from 120-300, with no trend throughout the day.  pt says dietary variation accounts for variability of cbg's.    Current Medications (verified): 1)  Metoprolol Tartrate 25 Mg  Tabs (Metoprolol Tartrate) .... 1/2 By Mouth Two Times A Day 2)  Isosorbide Mononitrate Cr 60 Mg  Tb24 (Isosorbide Mononitrate) .Marland Kitchen.. 1 By Mouth Once Daily 3)  Diltiazem Hcl Er Beads 180 Mg  Cp24 (Diltiazem Hcl Er Beads) .Marland Kitchen.. 1 By Mouth Qd 4)  Furosemide 40 Mg  Tabs (Furosemide) .Marland Kitchen.. 1 By Mouth Qd 5)  Omeprazole 20 Mg  Tbec (Omeprazole) .... Take 1 Tablet By Mouth Once A Day 6)  Lantus 100 Unit/ml  Soln (Insulin Glargine) .... 20  Units At Bedtime. 7)  Vitamin C 500 Mg Tabs (Ascorbic Acid) .Marland Kitchen.. 1 By Mouth Once Daily 8)  Amitriptyline Hcl 10 Mg Tabs (Amitriptyline Hcl) .Marland Kitchen.. 1 Tablet At Bedtime 9)  B-1 250 Mg Tabs (Thiamine Hcl) .... 1/2 By Mouth Once Daily 10)  Lactulose 10 Gm/72ml Soln (Lactulose) .... Take 1 and 1/2 Tablespoons Two Times A Day 11)  Freestyle Lancets  Misc (Lancets) .... Test 2-3 Times A Day  Dx 250.02 12)  Freestyle Lite Test  Strp (Glucose Blood) .... Test 2-3 Times Daily Dx  250.02 13)  Spironolactone 50 Mg Tabs (Spironolactone) .... Take 1 Tablet By Mouth Once A Day 14)  Ferrous Sulfate 325 (65 Fe) Mg  Tabs (Ferrous Sulfate) .Marland Kitchen.. 1 By Mouth Bid 15)  Humalog 100 Unit/ml Soln (Insulin Lispro (Human)) .... 40 Units (Just Before Each Meal) 16)  Insulin Syringe 31g X 5/16" 0.5 Ml Misc (Insulin Syringe-Needle U-100) .... Qid 17)  Calcium/magnesium/zinc Formula 1000-500-50 Mg Tabs (Calcium-Magnesium-Zinc) .... Take One Tablet By Mouth Once Daily. 18)  Vitamin E 400 Unit Caps (Vitamin E) .... Once Daily 19)  Talwin 30 Mg/ml Soln (Pentazocine Lactate) .... As Needed For Pain 20)  Xifaxan 550 Mg Tabs (Rifaximin) .... Take 1 Tablet By Mouth Two Times A Day 21)  Allopurinol 300 Mg Tabs (Allopurinol) .... Take 1/2 Tablet Once Daily 22)  Prednisone 5 Mg Tabs (Prednisone) .... Take One By Mouth Once Daily 23)  Vitamin D3 1000 Unit Tabs (Cholecalciferol) .... Two Tablet By Mouth Once Daily 24)  Polyethylene Glycol 3350  Powd (Polyethylene Glycol 3350) .Marland Kitchen.. 1 1/2 Tablespoons in Liquid Two Times A Day 25)  Oxycodone Hcl 5 Mg Tabs (Oxycodone Hcl) .Marland KitchenMarland KitchenMarland Kitchen  1 By Mouth Every 4 Hours As Needed 26)  Penicillin V Potassium 250 Mg Tabs (Penicillin V Potassium) .... Take 1 Tablet By Mouth Once A Day 27)  Athletes Foot 1 % Crea (Terbinafine Hcl) .... Apply Two Times A Day To Toes/nails For 1 Month 28)  Neurontin 100 Mg Caps (Gabapentin) .... Take 2 Three Times Daily  Allergies (verified): 1)  ! Sulfa 2)  ! * Dilaudid 3)  ! Codeine 4)  ! Morphine 5)  ! * Shellfish 6)  ! Demerol 7)  ! Zetia (Ezetimibe) 8)  ! 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 mild AI... aortic valve sclerosis Hypertension CABG...2004 Cellulitis RLE 10/2008 Sylva ,[place] Atrial Fibrillation...amiodarone in past, but problems and left off meds. Gout Sleep Apnea.Marland KitchenMarland KitchenCPAP Carotid bruits???  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    Physical Exam  General:  obese.  no distress  Extremities:  (sees podiatry) Skin:  injection sites without lesions.   Additional Exam:  Hemoglobin A1C       [H]  7.8 %    Impression & Recommendations:  Problem # 1:  DIABETES MELLITUS, TYPE II, UNCONTROLLED (ICD-250.02) needs increased rx  Medications Added to Medication List This Visit: 1)  Freestyle Lite Test Strp (Glucose blood) .... Test 2-3 times daily dx 250.02 2)  Humalog 100 Unit/ml Soln (Insulin lispro (human)) .... 42 units three times a day (just before each meal) 3)  Neurontin 100 Mg Caps (Gabapentin) .... Take 2 three times daily  Other Orders: TLB-A1C / Hgb A1C (Glycohemoglobin) (83036-A1C) Est. Patient Level III (82956)  Patient Instructions: 1)  tests are being ordered for you today.  a few days after the test(s), please call [phone] to hear your test results. 2)  pending the test results, please continue the same medications for now. 3)  check your blood sugar 2 times a day.  vary the time of day when you check, between before the 3 meals, and at bedtime.  also check if you have symptoms of your blood sugar being too high or too low.  please keep a record of the readings and bring it to your next appointment here.  please call us sooner if you are having low blood sugar episodes. 4)  Please schedule a follow-up appointment  in 4 months. 5)  (update: i left message on phone-tree:  increase humalog to 42 units three times a day (just before each meal))   Orders Added: 1)  TLB-A1C / Hgb A1C (Glycohemoglobin) [83036-A1C] 2)  Est. Patient Level III [78469]

## 2010-03-25 NOTE — Progress Notes (Signed)
Summary: Recall ECL  ---- Converted from flag ---- ---- 05/21/2009 7:22 PM, Iva Boop MD, [facility] wrote: discuss at REV  ---- 05/14/2009 3:14 PM, Harlow Mares CMA (AAMA) wrote: This is part of the recall project  patient was due for ECL back in 01/2009, would you like him to still have these procedures or discuss them at his next office visit, which is 05/26/2009.  Hulan Saas ------------------------------

## 2010-03-25 NOTE — Progress Notes (Signed)
Summary: Canceled ultrasound  Phone Note Call from Patient   Caller: Patient Call For: Dr. Leone Payor Reason for Call: Talk to Nurse Summary of Call: pt.'s wife canceled his ultrasound b/c pt. was admitted in [facility] on Friday and Ultrasound was done  Initial call taken by: Karna Christmas,  October 27, 2009 10:38 AM  Follow-up for Phone Call        I have copied and pasted in the [place] and canceled the future appointment. Follow-up by: Darcey Nora RN, CGRN,  October 27, 2009 11:02 AM

## 2010-03-25 NOTE — Assessment & Plan Note (Signed)
Summary: follow up/mt   Visit Type:  Follow-up Referring Provider:  Johny Sax, MD Primary Provider:  Marga Melnick, MD    History of Present Illness: The patient is seen for cardiology followup. He has not any chest pain.  He does have exertional shortness of breath.  He's had problems with recurrent cellulitis.  He gets swelling in his right leg and then cellulitis.  He says the swelling has increased in the past few days.  He says that he watches his salt intake.  He does have excessive fluid intake.  Historically he has good LV systolic function.  Current Medications (verified): 1)  Metoprolol Tartrate 25 Mg  Tabs (Metoprolol Tartrate) .... 1/2 By Mouth Two Times A Day 2)  Isosorbide Mononitrate Cr 60 Mg  Tb24 (Isosorbide Mononitrate) .Marland Kitchen.. 1 By Mouth Once Daily 3)  Diltiazem Hcl Er Beads 180 Mg  Cp24 (Diltiazem Hcl Er Beads) .Marland Kitchen.. 1 By Mouth Qd 4)  Furosemide 40 Mg  Tabs (Furosemide) .Marland Kitchen.. 1 By Mouth Qd 5)  Omeprazole 20 Mg  Tbec (Omeprazole) .... Take 1 Tablet By Mouth Once A Day 6)  Lantus 100 Unit/ml  Soln (Insulin Glargine) .... 20  Units At Bedtime. 7)  Vitamin C 500 Mg Tabs (Ascorbic Acid) .Marland Kitchen.. 1 By Mouth Once Daily 8)  Amitriptyline Hcl 10 Mg Tabs (Amitriptyline Hcl) .Marland Kitchen.. 1 Tablet At Bedtime 9)  B-1 250 Mg Tabs (Thiamine Hcl) .... 1/2 By Mouth Once Daily 10)  Lactulose 10 Gm/14ml Soln (Lactulose) .... Take 1 and 1/2 Tablespoons Two Times A Day 11)  Freestyle Lancets  Misc (Lancets) .... Test 2-3 Times A Day  Dx 250.02 12)  Freestyle Lite Test  Strp (Glucose Blood) .... Test 2-3 Times Daily Dx 250.02 13)  Spironolactone 50 Mg Tabs (Spironolactone) .... Take 1 Tablet By Mouth Once A Day 14)  Ferrous Sulfate 325 (65 Fe) Mg  Tabs (Ferrous Sulfate) .Marland Kitchen.. 1 By Mouth Bid 15)  Humalog 100 Unit/ml Soln (Insulin Lispro (Human)) .... 42 Units Three Times A Day (Just Before Each Meal) 16)  Insulin Syringe 31g X 5/16" 0.5 Ml Misc (Insulin Syringe-Needle U-100) .... Qid 17)   Calcium/magnesium/zinc Formula 1000-500-50 Mg Tabs (Calcium-Magnesium-Zinc) .... Take One Tablet By Mouth Once Daily. 18)  Vitamin E 400 Unit Caps (Vitamin E) .... Once Daily 19)  Talwin 30 Mg/ml Soln (Pentazocine Lactate) .... As Needed For Pain 20)  Xifaxan 550 Mg Tabs (Rifaximin) .... Take 1 Tablet By Mouth Two Times A Day 21)  Allopurinol 300 Mg Tabs (Allopurinol) .... Take 1/2 Tablet Once Daily 22)  Prednisone 5 Mg Tabs (Prednisone) .... Take One By Mouth Once Daily 23)  Vitamin D3 1000 Unit Tabs (Cholecalciferol) .... Two Tablet By Mouth Once Daily 24)  Polyethylene Glycol 3350  Powd (Polyethylene Glycol 3350) .Marland Kitchen.. 1 1/2 Tablespoons in Liquid Two Times A Day 25)  Penicillin V Potassium 250 Mg Tabs (Penicillin V Potassium) .... Take 1 Tablet By Mouth Once A Day 26)  Athletes Foot 1 % Crea (Terbinafine Hcl) .... Apply Two Times A Day To Toes/nails For 1 Month 27)  Neurontin 100 Mg Caps (Gabapentin) .... Take 2 Three Times Daily 28)  Vitamin B-6 100 Mg Tabs (Pyridoxine Hcl) .... One Tablet By Mouth Once Daily  Allergies (verified): 1)  ! Sulfa 2)  ! * Dilaudid 3)  ! Codeine 4)  ! Morphine 5)  ! * Shellfish 6)  ! Demerol 7)  ! Zetia (Ezetimibe) 8)  ! Augmentin 9)  ! 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 mild AI... aortic valve sclerosis Hypertension CABG...2004 Cellulitis RLE 10/2008 [place] ,[place] / recurrent episodes Dr. Ninetta Lights (ID) Atrial Fibrillation...amiodarone in past, but problems and left off  meds. Gout Sleep Apnea.Marland KitchenMarland KitchenCPAP Carotid bruits???  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  All other systems are reviewed and are negative.  Vital Signs:  Patient profile:   75 year old male Height:      76 inches Weight:      257 pounds BMI:     31.40 Pulse rate:   59 / minute BP sitting:   142 / 58  (left arm) Cuff size:   regular  Vitals Entered By: Hardin Negus, RMA (March 16, 2010 10:21 AM)  Physical Exam  General:  The patient is stable today.  He is walking with a cane. Head:  head is atraumatic. Eyes:  no xanthelasma. Neck:  no jugular venous distention. Chest Wall:  no chest wall tenderness. Lungs:  lungs reveal a few scattered rhonchi.  There is no respiratory distress. Heart:  cardiac exam reveals S1-S2.  There is a systolic murmur compatible with his aortic valve sclerosis. Abdomen:  abdomen is protuberant Msk:  swelling of the right leg Extremities:  2+ edema in his right leg with some erythema and warmth Skin:  erythema in the right lower extremity Psych:  patient is oriented to person time and place.  Affect is normal.   Impression & Recommendations:  Problem # 1:  EDEMA (ICD-782.3) The patient has edema in his right lower extremity.  I have not seen him over time to see what his leg usually looks like.  I believe it is more swollen than baseline.  I'm sure this is from a combination of factors.  However there may be some total body volume overload.  He is currently on furosemide 40 mg daily.  I will have him take a second dose of 40 mg today.  He will then take 80 mg tomorrow morning.  He will then return to his usual dose of medication.  However we will encourage him to cut back on his  extra fluid intake.  Problem # 2:  CELLULITIS, LEG, RIGHT (ICD-682.6) I'm concerned that he may have the beginning of recurrent cellulitis in his right lower extremity.  We will make an appointment to see Dr. Ninetta Lights of ID soon.  We will also make an appointment to see Dr. Alwyn Ren for early followup.  It is possible that with some diuresis his leg may look better.  Problem # 3:  PALPITATIONS (ICD-785.1)  His updated medication list for this problem includes:    Metoprolol Tartrate 25 Mg Tabs (Metoprolol tartrate) .Marland Kitchen... 1/2 by mouth two times a day    Isosorbide Mononitrate Cr 60 Mg Tb24 (Isosorbide mononitrate) .Marland Kitchen... 1 by mouth once daily    Diltiazem Hcl Er Beads 180 Mg Cp24 (Diltiazem hcl er beads) .Marland KitchenMarland KitchenMarland KitchenMarland Kitchen 1  by mouth qd Is having a significant palpitations.  Problem # 4:  ATRIAL FIBRILLATION, HX OF (ICD-V12.59) EKGs done today reviewed by me.  There are no diagnostic abnormalities.  There is no evidence of atrial fibrillation.  Problem # 5:  CAD (ICD-414.00) Disease is stable.  Is not having any significant symptoms or he is last Myoview scan was in 2007.  At this point he is not active.  He is not having symptoms.  I am inclined not to push for exercise testing at this time.  Problem # 6:  HYPERTENSION, ESSENTIAL NOS (ICD-401.9) blood pressures controlled.  No change in therapy.  I will see him back in one year for cardiology followup.  Other Orders: EKG w/ Interpretation (93000)  Patient Instructions: 1)  Take an extra 40mg  of Lasix (furosemide) today when you get home, in AM take 80mg  of Lasix and then Thursday resume your normal dose of 40mg  daily 2)  Your physician has requested that you limit your fluid intake daily. 3)  Follow up with Dr Ninetta Lights tom 1/25 at 9:15am 4)  Follow up with Dr Francesco Runner. 1/26 at 1:30pm 5)  Your physician wants you to follow-up with Dr Myrtis Ser in:  1 year. You will receive a reminder letter in the mail two months in advance. If you don't receive a letter,  please call our office to schedule the follow-up appointment.

## 2010-03-25 NOTE — Letter (Signed)
Summary: [facility]   Imported By: Lanelle Bal 12/12/2009 10:40:42  _____________________________________________________________________  External Attachment:    Type:   Image     Comment:   External Document

## 2010-03-25 NOTE — Progress Notes (Signed)
Summary: Unable to get both blood cultures  Phone Note From Other Clinic Call back at [phone]   Caller: Gentiva-Amy Summary of Call: Unable to get a peripheral blood culture (attempted x 3), able to get one from the pic line . Please advise if you would like for them to have another nurse go back out and attempt to get a second blood culture.  Dr.Hopper please advise   Shonna Chock CMA  November 13, 2009 11:44 AM   Follow-up for Phone Call        if 1 done that is sufficient; if none completed refer to  [facility] or Day Surgery for blood cultures Follow-up by: Marga Melnick MD,  November 13, 2009 1:02 PM  Additional Follow-up for Phone Call Additional follow up Details #1::        left detailed msg on voicemail w/ information ....Marland KitchenMarland KitchenDoristine Devoid CMA  November 13, 2009 3:41 PM

## 2010-03-25 NOTE — Progress Notes (Signed)
Summary: rx request  Phone Note Call from Patient Call back at Home Phone [phone]   Caller: Patient Summary of Call: pt called requesting rx for Lantus insulin to [facility] [phone]. pt states he was receiving samples from MD. pt advised that f/u will be due in March Initial call taken by: Margaret Pyle, CMA,  March 23, 2009 2:22 PM    Prescriptions: LANTUS 100 UNIT/ML  SOLN (INSULIN GLARGINE) 25  units at bedtime.  #1 mth x 3   Entered by:   Margaret Pyle, CMA   Authorized by:   Minus Breeding MD   Signed by:   Margaret Pyle, CMA on 03/23/2009   Method used:   Electronically to        [facility]  [address] (retail)       [address], [address]       [place], [place]  [address]       Ph: [phone]       Fax: [phone]   RxID:   [id number]

## 2010-03-25 NOTE — Progress Notes (Signed)
Summary:  saw Dr.Hatcher this a.m.  Phone Note Outgoing Call   Summary of Call: let patient know labs ok normal or stable (PLTS) I saw what Dr. Myrtis Ser recommended and will wait to see what other doctors suggest I think he may need increased diuretics for more than 2 days but will wait to see what Dr. Ninetta Lights says (I can check in EMR and will call them back) Iva Boop MD, [facility]  March 16, 2010 1:26 PM   Follow-up for Phone Call        Left message for patient to call back Darcey Nora RN, [facility]  March 16, 2010 4:01 PM  Additional Follow-up for Phone Call Additional follow up Details #1::        Wife [name] returned your call....she may be reached at [phone] Additional Follow-up by: Karna Christmas,  March 17, 2010 8:28 AM    Additional Follow-up for Phone Call Additional follow up Details #2::    Message left for Karena Addison to call back.   Teryl Lucy RN  March 17, 2010 9:57 AM   Wife contacted.Pt. saw Dr.Hatcher this a.m. Dr.Gessner please review his o.v. and advise. Follow-up by: Teryl Lucy RN,  March 17, 2010 11:46 AM  Additional Follow-up for Phone Call Additional follow up Details #3:: Details for Additional Follow-up Action Taken: Let her know I see what Dr. Ninetta Lights thought. Will wait on ultrasound of leg before any medication changes. I will follow-up. If she has not heard from me within 2 days of [place] call us back Additional Follow-up by: Iva Boop MD, Clementeen Graham,  March 17, 2010 12:24 PM  I have spoke with the patient's wife she will call back on Monday if she has not heard from [place]. Darcey Nora RN, CGRN  March 17, 2010 3:03 PM

## 2010-03-25 NOTE — Progress Notes (Signed)
Summary: question about medication  Phone Note Call from Patient Call back at Home Phone [phone]   Caller: Spouse/irlene Summary of Call: Calling about the medication Isosorbide 60mg  pt is saying the medcation prescription has a hold on it has question about it. Initial call taken by: Judie Grieve,  March 06, 2009 1:58 PM  Follow-up for Phone Call        spoke with pt's wife and I accidently left "holding" in the directions. Told pt that is was my mistake that since Dr Myrtis Ser told them to start back then that is what they should do. Follow-up by: Hardin Negus, RMA,  March 09, 2009 2:35 PM    New/Updated Medications: ISOSORBIDE MONONITRATE CR 60 MG  TB24 (ISOSORBIDE MONONITRATE) 1 by mouth once daily

## 2010-03-25 NOTE — Progress Notes (Signed)
Summary: [facility] PT ADMITTED  Phone Note Outgoing Call   Summary of Call: [facility] Triage Call Report Triage Record Num: [id number] Operator: Patriciaann Clan Patient Name: [name] Call Date & Time: 04/11/2009 10:38:06PM Patient Phone: [phone] PCP: Marga Melnick Patient Gender: Male PCP Fax : Patient DOB: [birth year] Practice Name: [facility] Reason for Call: Spouse/[name] calling about patient. States patient developed abdominal pain, onset 04/11/09 1200. States diarrhea, onset 04/11/09 1200, states loose BM X5. States abdominal pain relieved temporarily after having diarrhea. States patient developed temperature 100.8 orally @ 1830. Patient had tylenol 2 tabs. @ 1830 and again @ 2200 for fever. States patient also developed swelling and "red, hot place around middle of calf" onset 04/10/09 p.m. States redness and swelling increased 04/11/09 p.m. States area approx. 1/2 inch in size. States redness "goes all the way around his leg. States patient has hx of total knee replacement in same leg. BS 257 @ 2200 04/11/09. Spouse advised to have patietn evaluated inED @ [facility]. Spouse agreeable, states will transport now. Protocol(s) Used: Lower Leg Non-Injury Recommended Outcome per Protocol: See Provider within 4 hours Override Outcome if Used in Protocol: See ED Immediately RN Reason for Override Outcome: Nursing Judgement Used. Reason for Outcome: Recent onset of new one-sided swelling (edema), pain or tenderness, or red cord-like area in extremity Care Advice:  ~ Another adult should drive.  ~ SYMPTOM / CONDITION MANAGEMENT 02  Follow-up for Phone Call        called pt to see how he was doing, per pt wife pt was admitted to hospital. pt wife states that it was not cellulitis this time but possibly an infection..............Marland KitchenFelecia Deloach CMA  April 13, 2009 8:34 AM  Follow-up by: Marga Melnick MD,  April 13, 2009 6:03 PM  Additional Follow-up for  Phone Call Additional follow up Details #1::        noted Additional Follow-up by: Marga Melnick MD,  April 13, 2009 6:04 PM

## 2010-03-25 NOTE — Progress Notes (Signed)
Summary: Order for TIPS check  Phone Note Outgoing Call   Summary of Call: please order ultrasound arterial venous abdomen and pelvic doppler to follow-up TIPS and check patency - I received a fax request from [place] [facility] to do so (Dr. Deanne Coffer) Iva Boop MD, [facility]  Jun 26, 2009 7:04 PM   Follow-up for Phone Call        Order faxed to attention Tammy at [facility] [phone] Follow-up by: Darcey Nora RN, CGRN,  Jun 29, 2009 10:23 AM

## 2010-03-25 NOTE — Assessment & Plan Note (Signed)
Summary: f/u...as.   History of Present Illness Visit Type: Follow-up Visit Primary GI MD: Stan Head MD [facility] Primary Provider: Marga Melnick, MD Requesting Provider: n/a Chief Complaint: Patient feels good, only having some back problems History of Present Illness:   Last seen 03/17/09  Low back pain is major issue, limitig his activity but he is trying to go as best he can. Prednisone 5 mg daily helps joints but nt back and plan is to taper that in May Dierdre Forth). Energy level is stable. No confusion reported. Gets sleepy with pain pills but not otherwise. Bowel movements twice a day. No fevers or chills. He was hospitalized once since last visit, it was thought to be a viral gastroenteritis. Still has lind spot in right eye but rported improvement at retina specialist.   GI Review of Systems      Denies abdominal pain, acid reflux, belching, bloating, chest pain, dysphagia with liquids, dysphagia with solids, heartburn, loss of appetite, nausea, vomiting, vomiting blood, weight loss, and  weight gain.        Denies anal fissure, black tarry stools, change in bowel habit, constipation, diarrhea, diverticulosis, fecal incontinence, heme positive stool, hemorrhoids, irritable bowel syndrome, jaundice, light color stool, liver problems, rectal bleeding, and  rectal pain.    Current Medications (verified): 1)  Metoprolol Tartrate 25 Mg  Tabs (Metoprolol Tartrate) .... 1/2 By Mouth Two Times A Day 2)  Isosorbide Mononitrate Cr 60 Mg  Tb24 (Isosorbide Mononitrate) .Marland Kitchen.. 1 By Mouth Once Daily 3)  Diltiazem Hcl Er Beads 180 Mg  Cp24 (Diltiazem Hcl Er Beads) .Marland Kitchen.. 1 By Mouth Qd 4)  Furosemide 40 Mg  Tabs (Furosemide) .Marland Kitchen.. 1 By Mouth Qd 5)  Omeprazole 20 Mg  Tbec (Omeprazole) .... Take 1 Tablet By Mouth Once A Day 6)  Lantus 100 Unit/ml  Soln (Insulin Glargine) .... 25  Units At Bedtime. 7)  Vitamin C 500 Mg Tabs (Ascorbic Acid) .Marland Kitchen.. 1 By Mouth Once Daily 8)  Amitriptyline Hcl 10 Mg Tabs  (Amitriptyline Hcl) .Marland Kitchen.. 1 Tablet At Bedtime 9)  B-1 250 Mg Tabs (Thiamine Hcl) .... 1/2 By Mouth Once Daily 10)  Lactulose  Soln (Lactulose) .... Take 1 1/2  Tablespoon By Mouth Three Times A Day 11)  Freestyle Lancets  Misc (Lancets) .... Test 2-3 Times A Day  Dx 250.02 12)  Freestyle Test  Strp (Glucose Blood) .... Test 2-3 Times A Day Dx 250.02 13)  Spironolactone 50 Mg Tabs (Spironolactone) .... Take 1 Tablet By Mouth Once A Day 14)  Ferrous Sulfate 325 (65 Fe) Mg  Tabs (Ferrous Sulfate) .Marland Kitchen.. 1 By Mouth Bid 15)  Humalog 100 Unit/ml Soln (Insulin Lispro (Human)) .... 35 Units (Just Before Each Meal) 16)  Insulin Syringe 31g X 5/16" 0.5 Ml Misc (Insulin Syringe-Needle U-100) .... Qid 17)  Calcium/magnesium/zinc Formula 1000-500-50 Mg Tabs (Calcium-Magnesium-Zinc) .... Take One Tablet By Mouth Once Daily. 18)  Pentazocine-Naloxone Hcl 50-0.5 Mg Tabs (Pentazocine-Naloxone) .... Uad 19)  Vitamin E 400 Unit Caps (Vitamin E) .... Once Daily 20)  Talwin 30 Mg/ml Soln (Pentazocine Lactate) .... As Needed For Pain 21)  Xifaxan 550 Mg Tabs (Rifaximin) .... Take 1 Tablet By Mouth Two Times A Day 22)  Allopurinol 300 Mg Tabs (Allopurinol) .... Take 1/2 Tablet Once Daily 23)  Prednisone 5 Mg Tabs (Prednisone) .... Take One By Mouth Once Daily 24)  Vitamin D3 2000 Unit Caps (Cholecalciferol) .Marland Kitchen.. 1 By Mouth Once Daily  Allergies (verified): 1)  ! Sulfa 2)  ! *  Dilaudid 3)  ! Codeine 4)  ! Morphine 5)  ! * Shellfish 6)  ! Demerol 7)  ! Zetia (Ezetimibe) 8)  ! 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 mild AI... aortic valve sclerosis Hypertension CABG...2004 Cellulitis RLE 10/2008 Sylva ,[place] Atrial Fibrillation...amiodarone in past, but problems and left off meds. Gout Sleep Apnea.Marland KitchenMarland KitchenCPAP Carotid bruits???  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 Colonoscopy 2005 Dr Leone Payor, 12/08 also 6 Bypass Total knee replacement- right knee EGD 12/08, 12/09, 2/10 TIPS 2/10 laminectomy x 4  hemorrhoidectomy inguinal herniograpphy contracture repair-bilaterally on hands right arm surgery hiatal hernia repair  Family History: Reviewed history from 08/29/2008 and no changes required. Father: CA GI (stomach ???) Mother: Alzheimer's Siblings: MIs,CVAs, CA (melanoma) Family History of Diabetes: Brothers Family History of Heart Disease: 3 Brothers, 2 sisters Family History of Liver Disease/Cirrhosis:brother, sister No FH of Colon Cancer:  Social History: Reviewed history from 09/30/2008 and no changes required. Alcohol Use - None since Feb. 2010 Illicit Drug Use - no Patient does not get regular exercise.  Patient is a former smoker. -stopped over 40 years ago Occupation: retired married  Vital Signs:  Patient profile:   75 year old male Height:      76 inches Weight:      234 pounds BMI:     28.59 Pulse rate:   60 / minute Pulse rhythm:   regular BP sitting:   116 / 60  (left arm) Cuff size:   regular  Vitals Entered By: June McMurray CMA Duncan Dull) (May 26, 2009 8:27 AM)  Physical Exam  General:  elderly, frail, no distress  Eyes:  anicteric  Nose:  external deformity/ scarring,  rhinophyma Lungs:  Clear throughout to auscultation. Heart:  Regular rate and rhythm; no murmurs, rubs,  or bruits. Abdomen:  soft, non-tender, BS + medium ventral hernia and small umbilical hernia firm liver palable RUQ no splenomegaly Extremities:  Mixed arthritic hand changes. Fusiform knees w/o effusion. Support hose in place. Neurologic:  Coarse tremor RUE > LUE w/o asterixis a&o x 4  recent notes and labs reviewed  Impression & Recommendations:  Problem # 1:  CIRRHOSIS, ALCOHOLIC (ICD-571.2) Assessment Unchanged doing well - stavble  Problem # 2:  HEPATIC ENCEPHALOPATHY (ICD-572.2) Assessment: Unchanged doing well on curent regimen of Xifaxan and lactulose and will continue peripheral edema has not recured (previosuly thought related to Xifaxan by hospitalist)  Problem # 3:  COLONIC POLYPS, ADENOMATOUS, HX OF (ICD-V12.72) Assessment: Comment Only recall 12/11 will be considered but need to consider risk/benefitr ratio carefully as life expectancy diminshed and ability to tolerate complications is poor  Problem # 4:  ARTERIAL BRANCH OCCLUSION OF RETINA (ICD-362.32) Assessment: Unchanged stable followed by Dr. Lewanda Rife  Patient Instructions: 1)  Please continue current medications.  2)  Please schedule a follow-up appointment in 3 months. 3)  Please schedule a follow-up appointment as needed sooner if having  problems 4)  Copy sent to : Stephannie Li, MD 5)  The medication list was reviewed and reconciled.  All changed / newly prescribed medications were explained.  A complete medication list was provided to the patient / caregiver.

## 2010-03-25 NOTE — Medication Information (Signed)
Summary: Pt's request for Rx refills  Pt's request for Rx refills   Imported By: Sherian Rein 03/02/2009 08:01:06  _____________________________________________________________________  External Attachment:    Type:   Image     Comment:   External Document

## 2010-03-25 NOTE — Assessment & Plan Note (Signed)
Summary: F/U RECUR CELLULITIS/VS   Referring Provider:  Johny Sax, MD Primary Provider:  Marga Melnick, MD   CC:  pt. c/o right lower leg cellulitis and Dr. Myrtis Ser wanted pt. seen.  History of Present Illness: 75 yo M with hx of DM2 and alcoholic chirrhosis s/p tips. He was am to [facility] hospital 10-23-09 to 9-7-11for fever, nausea, vomiting, chills, right lower extremity cellulitis. He had BCx drawn on adm and was found to have 1/2 + for Pseudomonas aeruginosa (pan-sens). He was d/c home on IV levaquin.  Had no problems with antibiotics. Completed 11-11-09. Had f/u BCx 11-19-09 negative. At his f/u visit was started on Pen VK for prophylaxis. Was seen 12-24-09 and continued on terbenafine, PEN VK. Seen by his cardiologist yesterday and felt that his swelling is worse. Had his lasix dose doubled yesterday and today. Has improved somewhat. Leg is not weeping. Has no increase in heat in his leg.   Preventive Screening-Counseling & Management  Alcohol-Tobacco     Alcohol drinks/day: patient has quit / February of 2010     Alcohol type: binge drinking     Smoking Status: quit     Year Quit: 1964  Caffeine-Diet-Exercise     Caffeine use/day: coffee 1 per day     Does Patient Exercise: no     Type of exercise: active around house  Safety-Violence-Falls     Seat Belt Use: yes      Drug Use:  no.     Current Allergies (reviewed today): ! SULFA ! * DILAUDID ! CODEINE ! MORPHINE ! * SHELLFISH ! DEMEROL ! ZETIA (EZETIMIBE) ! AUGMENTIN ! ASA MORPHINE SULFATE (MORPHINE SULFATE) Vital Signs:  Patient profile:   75 year old male Height:      76 inches (193.04 cm) Weight:      255.8 pounds (116.27 kg) BMI:     31.25 Temp:     99.0 degrees F (37.22 degrees C) Pulse rate:   65 / minute BP sitting:   149 / 65  (left arm)  Vitals Entered By: Wendall Mola CMA Duncan Dull) (March 17, 2010 9:36 AM) CC: pt. c/o right lower leg cellulitis, Dr. Myrtis Ser wanted pt. seen Is Patient Diabetic?  Yes Did you bring your meter with you today? No Pain Assessment Patient in pain? yes     Location: knees and back Intensity: 6 Type: aching Onset of pain  Constant Nutritional Status BMI of > 30 = obese Nutritional Status Detail appetite "good"  Have you ever been in a relationship where you felt threatened, hurt or afraid?Unable to ask   Does patient need assistance? Functional Status Self care Ambulation Normal Comments no missed doses of meds per pt.   Physical Exam  General:  well-developed, well-nourished, well-hydrated, and overweight-appearing.   Eyes:  pupils equal, pupils round, and pupils reactive to light.   Mouth:  pharynx pink and moist and no exudates.   Lungs:  normal respiratory effort and normal breath sounds.   Heart:  normal rate and regular rhythm.   Abdomen:  soft, non-tender, and normal bowel sounds.   Extremities:  right pretibial edema.  no increase in heat. mild tenderness along lower tibia. no cordis.   Impression & Recommendations:  Problem # 1:  CELLULITIS, LEG, RIGHT (ICD-682.6)  His leg does not seem infected. Will send him for an u/s of leg to get a better look and make sure that he does not have a DVT. Will continue his PEN VK and topical antifungal  for his nails. Hopefully, his lasix dose increase will help.  will see him back in 6 months unless his leg worsens or his u/s is positive.  His updated medication list for this problem includes:    Talwin 30 Mg/ml Soln (Pentazocine lactate) .Marland Kitchen... As needed for pain    Xifaxan 550 Mg Tabs (Rifaximin) .Marland Kitchen... Take 1 tablet by mouth two times a day    Penicillin V Potassium 250 Mg Tabs (Penicillin v potassium) .Marland Kitchen... Take 1 tablet by mouth once a day  Orders: LE Venous Duplex (DVT) (DVT) New Patient Level IV ([address]109)

## 2010-03-25 NOTE — Assessment & Plan Note (Signed)
Summary: CHILLS/FEVER/KN   Vital Signs:  Patient profile:   75 year old male O2 Sat:      84 % Temp:     102.0 degrees F oral Pulse rate:   83 / minute Resp:     19 per minute BP sitting:   116 / 60  (left arm) Cuff size:   large  Vitals Entered By: Shonna Chock CMA (October 23, 2009 12:19 PM) CC: Chills,fever,shakey, and neck pain (no known injury). Onset 9am today Comments Patient was placed on 2 liters of Oxygen   Primary Care Provider:  Marga Melnick, MD   CC:  Chills, fever, shakey, and and neck pain (no known injury). Onset 9am today.  History of Present Illness: Acute onset of rigor & chills this am  with progressive rise in temp. N& V upon arrival @ this office. Dr . Leone Payor questioned possible ascites when seen 10/22/2009 due to weight gain. That dictatiion was reviewed.[place] scheduled for 10/28/2009. Only localizing signs are neck pain & ? slight redness R shin . Dr Dierdre Forth has Rxed 5 mg Prednisone ad for arthritis; no PMH of PMR as per Mrs. Rivest. Dr Hazle Quant , Ophth has Rxed Minocycline two times a day for a  sty of the eyelid. A1c was 8.2 % on 07/15.Dr Everardo All has increased  Humalog to 40 units three times a day ac & Lantus was decreased to 20 units .  Current Medications (verified): 1)  Metoprolol Tartrate 25 Mg  Tabs (Metoprolol Tartrate) .... 1/2 By Mouth Two Times A Day 2)  Isosorbide Mononitrate Cr 60 Mg  Tb24 (Isosorbide Mononitrate) .Marland Kitchen.. 1 By Mouth Once Daily 3)  Diltiazem Hcl Er Beads 180 Mg  Cp24 (Diltiazem Hcl Er Beads) .Marland Kitchen.. 1 By Mouth Qd 4)  Furosemide 40 Mg  Tabs (Furosemide) .Marland Kitchen.. 1 By Mouth Qd 5)  Omeprazole 20 Mg  Tbec (Omeprazole) .... Take 1 Tablet By Mouth Once A Day 6)  Lantus 100 Unit/ml  Soln (Insulin Glargine) .... 20  Units At Bedtime. 7)  Vitamin C 500 Mg Tabs (Ascorbic Acid) .Marland Kitchen.. 1 By Mouth Once Daily 8)  Amitriptyline Hcl 10 Mg Tabs (Amitriptyline Hcl) .Marland Kitchen.. 1 Tablet At Bedtime 9)  B-1 250 Mg Tabs (Thiamine Hcl) .... 1/2 By Mouth Once Daily 10)   Lactulose 10 Gm/56ml Soln (Lactulose) .... Take 1 and 1/2 Tablespoons Two Times A Day 11)  Freestyle Lancets  Misc (Lancets) .... Test 2-3 Times A Day  Dx 250.02 12)  Freestyle Test  Strp (Glucose Blood) .... Test 2-3 Times A Day Dx 250.02 13)  Spironolactone 50 Mg Tabs (Spironolactone) .... Take 1 Tablet By Mouth Once A Day 14)  Ferrous Sulfate 325 (65 Fe) Mg  Tabs (Ferrous Sulfate) .Marland Kitchen.. 1 By Mouth Bid 15)  Humalog 100 Unit/ml Soln (Insulin Lispro (Human)) .... 40 Units (Just Before Each Meal) 16)  Insulin Syringe 31g X 5/16" 0.5 Ml Misc (Insulin Syringe-Needle U-100) .... Qid 17)  Calcium/magnesium/zinc Formula 1000-500-50 Mg Tabs (Calcium-Magnesium-Zinc) .... Take One Tablet By Mouth Once Daily. 18)  Vitamin E 400 Unit Caps (Vitamin E) .... Once Daily 19)  Talwin 30 Mg/ml Soln (Pentazocine Lactate) .... As Needed For Pain 20)  Xifaxan 550 Mg Tabs (Rifaximin) .... Take 1 Tablet By Mouth Two Times A Day 21)  Allopurinol 300 Mg Tabs (Allopurinol) .... Take 1/2 Tablet Once Daily 22)  Prednisone 5 Mg Tabs (Prednisone) .... Take One By Mouth Once Daily 23)  Vitamin D3 1000 Unit Tabs (Cholecalciferol) .... Two Tablet  By Mouth Once Daily 24)  Minocycline Hcl 50 Mg Caps (Minocycline Hcl) .... One Capsule By Mouth Two Times A Day 25)  Polyethylene Glycol 3350  Powd (Polyethylene Glycol 3350) .Marland Kitchen.. 1 1/2 Tablespoons in Liquid Two Times A Day  Allergies: 1)  ! Sulfa 2)  ! * Dilaudid 3)  ! Codeine 4)  ! Morphine 5)  ! * Shellfish 6)  ! Demerol 7)  ! Zetia (Ezetimibe) 8)  ! Augmentin 9)  Morphine Sulfate (Morphine Sulfate) 10)  Oxycontin (Oxycodone Hcl) 11)  Sulfamethoxazole (Sulfamethoxazole) 12)  Codeine Phosphate (Codeine Phosphate)  Review of Systems General:  Denies loss of appetite. Eyes:  Denies discharge, eye pain, and red eye. ENT:  Denies ear discharge, earache, and sore throat; No frontal headache , facial pain or purulence. Resp:  Denies cough, coughing up blood, and sputum  productive. GI:  Denies diarrhea; On Miralax for constipation  two times a day . GU:  Denies discharge, dysuria, and hematuria. MS:  Complains of joint pain; denies joint redness and joint swelling. Derm:  Complains of changes in color of skin; denies lesion(s) and rash; No pustules recently.  Physical Exam  General:  unable to place on exam table.Profoundly lethargic; sitting in W/C with head slumped    Eyes:  No corneal or conjunctival inflammation noted.Marland Kitchen Perrla. large sty OS upper lid Ears:  External ear exam shows no significant lesions or deformities.  Otoscopic examination reveals clear canals, tympanic membranes are intact bilaterally without bulging, retraction, inflammation or discharge. Hearing is grossly normal bilaterally. Nose:  External nasal examination shows  chronic scrring w/oinflammation. Nasal mucosa are pink and moist without lesions or exudates. Mouth:  Oral mucosa and oropharynx without lesions or exudates.  Teeth in  fair a repair. Neck:  No deformities, masses, or tenderness noted. Supple Lungs:  Normal respiratory effort, chest expands symmetrically. Lungs are clear to auscultation, no crackles or wheezes.BS decreased Heart:  normal rate, regular rhythm, no gallop, no rub, no JVD, and grade 1 /6 systolic murmur.  Occasional preemature Abdomen:  Bowel sounds positive but decrased,abdomen soft and non-tender without masses, organomegaly or hernias noted.Abdomen distended Pulses:  R and L carotid,radial pulses are full and equal bilaterally. Pedal pulses decreased Extremities:  1+ left pedal edema and 2+ right pedal edema.   Support hose inplace Neurologic:  obtunded but arousable. Some asterixis   Skin:  Dense bruising of dorsal hands. Erythema R shin with increased temp Cervical Nodes:  No lymphadenopathy noted Axillary Nodes:  No palpable lymphadenopathy   Impression & Recommendations:  Problem # 1:  FEVER (ICD-780.60)  Problem # 2:  VOMITING  (ICD-787.03)  Problem # 3:  ALTERED MENTAL STATUS (ICD-780.97) from #1 & 2; R/o infected ascites or meningitis(clinically neck supple)  Problem # 4:  CELLULITIS, LEG, RIGHT (ICD-682.6) clinically suggested; PMH of recurrent cellulitis His updated medication list for this problem includes:    Xifaxan 550 Mg Tabs (Rifaximin) .Marland Kitchen... Take 1 tablet by mouth two times a day    Minocycline Hcl 50 Mg Caps (Minocycline hcl) ..... One capsule by mouth two times a day  Problem # 5:  DIABETES MELLITUS, TYPE II, UNCONTROLLED (ICD-250.02)  His updated medication list for this problem includes:    Lantus 100 Unit/ml Soln (Insulin glargine) .Marland Kitchen... 20  units at bedtime.    Humalog 100 Unit/ml Soln (Insulin lispro (human)) .Marland KitchenMarland KitchenMarland KitchenMarland Kitchen 40 units (just before each meal)  Complete Medication List: 1)  Metoprolol Tartrate 25 Mg Tabs (Metoprolol tartrate) .... 1/2 by  mouth two times a day 2)  Isosorbide Mononitrate Cr 60 Mg Tb24 (Isosorbide mononitrate) .Marland Kitchen.. 1 by mouth once daily 3)  Diltiazem Hcl Er Beads 180 Mg Cp24 (Diltiazem hcl er beads) .Marland Kitchen.. 1 by mouth qd 4)  Furosemide 40 Mg Tabs (Furosemide) .Marland Kitchen.. 1 by mouth qd 5)  Omeprazole 20 Mg Tbec (Omeprazole) .... Take 1 tablet by mouth once a day 6)  Lantus 100 Unit/ml Soln (Insulin glargine) .... 20  units at bedtime. 7)  Vitamin C 500 Mg Tabs (Ascorbic acid) .Marland Kitchen.. 1 by mouth once daily 8)  Amitriptyline Hcl 10 Mg Tabs (Amitriptyline hcl) .Marland Kitchen.. 1 tablet at bedtime 9)  B-1 250 Mg Tabs (Thiamine hcl) .... 1/2 by mouth once daily 10)  Lactulose 10 Gm/64ml Soln (Lactulose) .... Take 1 and 1/2 tablespoons two times a day 11)  Freestyle Lancets Misc (Lancets) .... Test 2-3 times a day  dx 250.02 12)  Freestyle Test Strp (Glucose blood) .... Test 2-3 times a day dx 250.02 13)  Spironolactone 50 Mg Tabs (Spironolactone) .... Take 1 tablet by mouth once a day 14)  Ferrous Sulfate 325 (65 Fe) Mg Tabs (Ferrous sulfate) .Marland Kitchen.. 1 by mouth bid 15)  Humalog 100 Unit/ml Soln (Insulin  lispro (human)) .... 40 units (just before each meal) 16)  Insulin Syringe 31g X 5/16" 0.5 Ml Misc (Insulin syringe-needle u-100) .... Qid 17)  Calcium/magnesium/zinc Formula 1000-500-50 Mg Tabs (Calcium-magnesium-zinc) .... Take one tablet by mouth once daily. 18)  Vitamin E 400 Unit Caps (Vitamin e) .... Once daily 19)  Talwin 30 Mg/ml Soln (Pentazocine lactate) .... As needed for pain 20)  Xifaxan 550 Mg Tabs (Rifaximin) .... Take 1 tablet by mouth two times a day 21)  Allopurinol 300 Mg Tabs (Allopurinol) .... Take 1/2 tablet once daily 22)  Prednisone 5 Mg Tabs (Prednisone) .... Take one by mouth once daily 23)  Vitamin D3 1000 Unit Tabs (Cholecalciferol) .... Two tablet by mouth once daily 24)  Minocycline Hcl 50 Mg Caps (Minocycline hcl) .... One capsule by mouth two times a day 25)  Polyethylene Glycol 3350 Powd (Polyethylene glycol 3350) .Marland Kitchen.. 1 1/2 tablespoons in liquid two times a day  Patient Instructions: 1)  Ambulance transport to [facility] ER . Probable ICU admission due to multiple organ system issues , uncontrolled DM & possible sepsis.

## 2010-03-25 NOTE — Consult Note (Signed)
Summary: [facility]  [facility]   Imported By: Florinda Marker 12/15/2009 13:45:46  _____________________________________________________________________  External Attachment:    Type:   Image     Comment:   External Document

## 2010-03-25 NOTE — Discharge Summary (Signed)
NAMEVALERIAN, [name]                   ACCOUNT NO.:  [id number]      MEDICAL RECORD NO.:  [id number]          PATIENT TYPE:  INP      LOCATION:  5528                         FACILITY:  [facility]      PHYSICIAN:  Isidor Holts, M.D.  DATE OF BIRTH:  1935-10-09      DATE OF ADMISSION:  04/12/2009   DATE OF DISCHARGE:  04/14/2009                                  DISCHARGE SUMMARY      PRIMARY MD:  Dr. Dr. Janace Hoard.      DISCHARGE DIAGNOSES:   1. Acute enteritis.   2. Altered mental status, secondary to hyperammonemia/mild porto-       systemic encephalopathy.   3. History of alcoholic liver disease/cirrhosis.   4. Portal hypertension.   5. Esophageal varices.   6. Coronary artery disease status post coronary artery bypass graft.   7. Diastolic dysfunction, ejection fraction 65%.   8. History of paroxysmal atrial fibrillation.   9. Status post previous cerebrovascular accident.   10.Type 2 diabetes mellitus.   11.Dyslipidemia.   12.Gout.   13.History of multiple back surgeries.   14.Previous history of recurrent right lower extremity cellulitis.      DISCHARGE MEDICATIONS:   1. Lactulose one and a half tablespoon p.o. t.i.d. (changed from       b.i.d.)   2. Allopurinol 150 mg p.o. daily.   3. Amitriptyline 10 mg p.o. q.h.s.   4. Calcium/magnesium/Zinc OTC 1 tablet p.o. daily.   5. Cardizem CD 80 mg p.o. daily.   6. Ferrous sulfate 325 mg p.o. b.i.d.   7. Furosemide 40 mg p.o. daily.   8. Humalog insulin 30 units subcutaneously t.i.d. before meals.   9. Isosorbide mononitrate XR 60 mg p.o. daily.   10.Lantus 25 units subcutaneously q.h.s.   11.Metoprolol tartrate 12.5 mg p.o. b.i.d.   12.Omeprazole 20 mg p.o. q.a.m. 15-20 minutes before breakfast   13.Prednisone 5 mg p.o. daily.   14.Spironolactone 50 mg p.o. daily.   15.__________  (50/0.5 mg) 1-2 pills p.o. p.r.n. q.6 hourly for pain.   16.Thiamine OTC 125 mg p.o. daily.   17.Vitamin C 500 mg p.o. daily.   18.Vitamin  D3 2000 units p.o. daily.   19.Vitamin E 400 international units p.o. daily.   20.Rifaximin 550 mg 2 tablets p.o. b.i.d.   21.MiraLAX has been discontinued secondary to diarrhea.      PROCEDURES:  Abdominal/chest x-ray April 18, 2009.  This showed no   acute finding in the chest or abdomen.  TIPS shunt noted.      CONSULTATIONS:  None.      ADMISSION HISTORY:  As in history and physical notes of April 18, 2009, dictated by Dr. Massie Maroon.  However, in brief, this is a 75-   year-old male, with known history of coronary artery disease status post   CABG, diastolic dysfunction, ejection fraction 65% on 2-D echocardiogram   of February 18, 2009, mild aortic valve stenosis, type 2 diabetes   mellitus insulin-requiring, hypertension, dyslipidemia, liver cirrhosis  secondary to alcohol abuse, recurrence right lower extremity cellulitis,   status post CVA December 2010, gout, COPD, remote history of GI   bleeding, history of esophageal varices, paroxysmal atrial fibrillation,   status post multiple back surgeries, status post hemorrhoidectomy and   inguinal hernia repair, status post Dupuytren's contracture repair,   status post right total knee arthroplasty, presenting with diarrhea   associated with nausea, but no vomiting.  At the time of evaluation in   the Emergency Department, he was found to be mildly confused, ammonia   level was noted to be 160.  He was admitted for further evaluation,   investigation and management.      CLINICAL COURSE:   1. Acute enteritis.  For details of presentation, refer to admission       history above.  The patient was on MiraLAX at time of presentation.       This was discontinued.  He was managed with bowel rest and gentle       intravenous fluid hydration.  He had no episodes of diarrhea during       the course of this hospitalization.  Hydration status quickly       normalized by April 13, 2009.  He had no further episodes of        diarrhea, was able to tolerate regular oral intake and we were       subsequently able to discontinue the patient's intravenous fluids.      2. Mild porto-systemic encephalopathy.  The patient presented with an       ammonia level of 160, was managed with lactulose without any       deleterious consequences or exacerbation of diarrhea and by April 14, 2009, ammonia level was much improved at 48.  He did have mild       asterixis at time of presentation, however, by April 14, 2009       this had resolved and the patient was fully oriented.      3. Coronary artery disease.  There were no problems referable to this.      4. History of liver cirrhosis/portal hypertension, status post TIPS.       The patient was continued on Lasix and Spironolactone, as well as       beta-blocker during the course of this hospitalization.  He had no       clinical ascites and no other evidence of decompensation.      5. Diabetes mellitus.  The patient was managed with sliding scale       insulin coverage and scheduled Lantus insulin.  He did have an       episode of hypoglycemia late on April 13, 2009; however, this       responded to reduction in Lantus dosage and encouragement of oral       intake.      6. Gout.  This did not prove problematic.      DISPOSITION:  The patient April 14, 2009 asymptomatic, fully   oriented, well-hydrated very keen to be discharged.  There were no   further acute problems.  He was considered clinically stable and   discharged accordingly.      DIET:  Heart-healthy/carbohydrate modified.      ACTIVITY:  As tolerated.      FOLLOWUP INSTRUCTIONS:  The patient is to follow up routinely with his   primary MD., Dr. Janace Hoard for a prior scheduled  appointment.               Isidor Holts, M.D.            CO/MEDQ  D:  04/14/2009  T:  04/14/2009  Job:  [id number]      cc:   Peyton Najjar, MD      Electronically Signed by Isidor Holts M.D. on  04/26/2009 05:36:57 PM

## 2010-03-25 NOTE — Assessment & Plan Note (Signed)
Summary: 4 MTH FU STC   Vital Signs:  Patient profile:   75 year old male Height:      76 inches (193.04 cm) Weight:      233.25 pounds (106.02 kg) O2 Sat:      97 % on Room air Temp:     97.4 degrees F (36.33 degrees C) oral Pulse rate:   62 / minute BP sitting:   122 / 70  (left arm) Cuff size:   large  Vitals Entered ByDebby Freiberg Christopher(May 01, 2009 9:08 AM)  O2 Flow:  Room air CC: 4 month F/U   Referring Provider:  n/a Primary Provider:  Marga Melnick, MD  CC:  4 month F/U.  History of Present Illness: pt states he feels well in general.  he brings a record of his cbg's which i have reviewed today.  he checks in am and at hs (100's-200's).  Current Medications (verified): 1)  Metoprolol Tartrate 25 Mg  Tabs (Metoprolol Tartrate) .... 1/2 By Mouth Two Times A Day 2)  Isosorbide Mononitrate Cr 60 Mg  Tb24 (Isosorbide Mononitrate) .Marland Kitchen.. 1 By Mouth Once Daily 3)  Diltiazem Hcl Er Beads 180 Mg  Cp24 (Diltiazem Hcl Er Beads) .Marland Kitchen.. 1 By Mouth Qd 4)  Furosemide 40 Mg  Tabs (Furosemide) .Marland Kitchen.. 1 By Mouth Qd 5)  Omeprazole 20 Mg  Tbec (Omeprazole) .... Take 1 Tablet By Mouth Two Times A Day 6)  Lantus 100 Unit/ml  Soln (Insulin Glargine) .... 25  Units At Bedtime. 7)  Vitamin C 500 Mg Tabs (Ascorbic Acid) .Marland Kitchen.. 1 By Mouth Once Daily 8)  Amitriptyline Hcl 10 Mg Tabs (Amitriptyline Hcl) .Marland Kitchen.. 1 Tablet At Bedtime 9)  B-1 250 Mg Tabs (Thiamine Hcl) .... 1/2 By Mouth Once Daily 10)  Lactulose  Soln (Lactulose) .... Take 1 1/2  Tablespoon By Mouth Three Times A Day 11)  Freestyle Lancets  Misc (Lancets) .... Test 2-3 Times A Day  Dx 250.02 12)  Freestyle Test  Strp (Glucose Blood) .... Test 2-3 Times A Day Dx 250.02 13)  Spironolactone 50 Mg Tabs (Spironolactone) .... Take 1 Tablet By Mouth Once A Day 14)  Ferrous Sulfate 325 (65 Fe) Mg  Tabs (Ferrous Sulfate) .Marland Kitchen.. 1 By Mouth Bid 15)  Humalog 100 Unit/ml Soln (Insulin Lispro (Human)) .... 30 Units (Just Before Each Meal) 16)  Insulin  Syringe 31g X 5/16" 0.5 Ml Misc (Insulin Syringe-Needle U-100) .... Qid 17)  Calcium/magnesium/zinc Formula 1000-500-50 Mg Tabs (Calcium-Magnesium-Zinc) .... Take One Tablet By Mouth Once Daily. 18)  Pentazocine-Naloxone Hcl 50-0.5 Mg Tabs (Pentazocine-Naloxone) .... Uad 19)  Vitamin E 400 Unit Caps (Vitamin E) .... Once Daily 20)  Talwin 30 Mg/ml Soln (Pentazocine Lactate) .... As Needed For Pain 21)  Xifaxan 550 Mg Tabs (Rifaximin) .... Take 1 Tablet By Mouth Two Times A Day 22)  Allopurinol 300 Mg Tabs (Allopurinol) .... Take 1/2 Tablet Once Daily 23)  Prednisone 5 Mg Tabs (Prednisone) .... Take One By Mouth Once Daily 24)  Vitamin D3 2000 Unit Caps (Cholecalciferol) .Marland Kitchen.. 1 By Mouth Once Daily  Allergies (verified): 1)  ! Sulfa 2)  ! * Dilaudid 3)  ! Codeine 4)  ! Morphine 5)  ! * Shellfish 6)  ! Demerol 7)  ! Zetia (Ezetimibe) 8)  ! 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 mild AI... aortic valve sclerosis Hypertension CABG...2004 Cellulitis RLE 10/2008 Sylva ,[place] Atrial Fibrillation...amiodarone in past, but problems and left off meds. Gout Sleep Apnea.Marland KitchenMarland KitchenCPAP Carotid bruits???  but dopplers OK Barrett's Esophagus (short-segment) suspected Venous insufficinecy  Physical Exam  General:  elderly, frail, no distress  Neck:  Supple without thyroid enlargement or  tenderness.  Additional Exam:  Hemoglobin A1C       [H]  7.7 %    Impression & Recommendations:  Problem # 1:  DIABETES MELLITUS, TYPE II, UNCONTROLLED (ICD-250.02) needs increased rx  Medications Added to Medication List This Visit: 1)  Humalog 100 Unit/ml Soln (Insulin lispro (human)) .... 35 units (just before each meal)  Other Orders: TLB-A1C / Hgb A1C (Glycohemoglobin) (83036-A1C) TLB-Microalbumin/Creat Ratio, Urine (82043-MALB) Est. Patient Level III ([address])  Patient Instructions: 1)  tests are being ordered for you today.  a few days after the test(s), please call [phone] to hear your test results. 2)  pending the test results, please continue humalog 30 units just before each meal, and continue lantus 25 units per day. 3)  check your blood sugar 2 times a day.  vary the time of day when you check, between before the 3 meals, and at bedtime.  also check if you have symptoms of your blood sugar being too high or too low.  please keep a record of the readings and bring it to your next appointment here.  please call us sooner if you are having low blood sugar episodes. 4)  Please schedule a follow-up appointment in 4 months. 5)  (update: i left message on phone-tree:  increase humalog to 35 units three times a day (just before each meal)

## 2010-03-25 NOTE — Assessment & Plan Note (Signed)
Summary: 11:15new pt pseudo/cellulitis   Referring Provider:  na  Primary Provider:  Marga Melnick, MD   CC:  new patient / pseudo/cellulitis.  History of Present Illness: 75 yo M with hx of DM2 and alcoholic chirrhosis s/p tips. He was am to [facility] hospital 10-23-09 to 9-7-11for fever, nausea, vomiting, chills, right lower extremity cellulitis. He had BCx drawn on adm and was found to have 1/2 + for Pseudomonas aeruginosa (pan-sens). He was d/c home on IV levaquin.  Has had no problems with antibiotics. Completed 11-11-09. Had f/u Bcx from [facility] (only 1) done on 11-13-09. Leg is improved. No skin breakdown, has mild swelling which is at his baseline. No f/c.   Has had 4 episodes of LE cellulitis 2010.   Preventive Screening-Counseling & Management  Alcohol-Tobacco     Alcohol drinks/day: patient has quit / February of 2010     Alcohol type: binge drinking     Smoking Status: quit     Year Quit: 1964  Caffeine-Diet-Exercise     Caffeine use/day: coffee     Does Patient Exercise: no     Type of exercise: active around house  Safety-Violence-Falls     Seat Belt Use: yes  Current Medications (verified): 1)  Metoprolol Tartrate 25 Mg  Tabs (Metoprolol Tartrate) .... 1/2 By Mouth Two Times A Day 2)  Isosorbide Mononitrate Cr 60 Mg  Tb24 (Isosorbide Mononitrate) .Marland Kitchen.. 1 By Mouth Once Daily 3)  Diltiazem Hcl Er Beads 180 Mg  Cp24 (Diltiazem Hcl Er Beads) .Marland Kitchen.. 1 By Mouth Qd 4)  Furosemide 40 Mg  Tabs (Furosemide) .Marland Kitchen.. 1 By Mouth Qd 5)  Omeprazole 20 Mg  Tbec (Omeprazole) .... Take 1 Tablet By Mouth Once A Day 6)  Lantus 100 Unit/ml  Soln (Insulin Glargine) .... 20  Units At Bedtime. 7)  Vitamin C 500 Mg Tabs (Ascorbic Acid) .Marland Kitchen.. 1 By Mouth Once Daily 8)  Amitriptyline Hcl 10 Mg Tabs (Amitriptyline Hcl) .Marland Kitchen.. 1 Tablet At Bedtime 9)  B-1 250 Mg Tabs (Thiamine Hcl) .... 1/2 By Mouth Once Daily 10)  Lactulose 10 Gm/41ml Soln (Lactulose) .... Take 1 and 1/2 Tablespoons Two Times A Day 11)   Freestyle Lancets  Misc (Lancets) .... Test 2-3 Times A Day  Dx 250.02 12)  Freestyle Test  Strp (Glucose Blood) .... Test 2-3 Times A Day Dx 250.02 13)  Spironolactone 50 Mg Tabs (Spironolactone) .... Take 1 Tablet By Mouth Once A Day 14)  Ferrous Sulfate 325 (65 Fe) Mg  Tabs (Ferrous Sulfate) .Marland Kitchen.. 1 By Mouth Bid 15)  Humalog 100 Unit/ml Soln (Insulin Lispro (Human)) .... 40 Units (Just Before Each Meal) 16)  Insulin Syringe 31g X 5/16" 0.5 Ml Misc (Insulin Syringe-Needle U-100) .... Qid 17)  Calcium/magnesium/zinc Formula 1000-500-50 Mg Tabs (Calcium-Magnesium-Zinc) .... Take One Tablet By Mouth Once Daily. 18)  Vitamin E 400 Unit Caps (Vitamin E) .... Once Daily 19)  Talwin 30 Mg/ml Soln (Pentazocine Lactate) .... As Needed For Pain 20)  Xifaxan 550 Mg Tabs (Rifaximin) .... Take 1 Tablet By Mouth Two Times A Day 21)  Allopurinol 300 Mg Tabs (Allopurinol) .... Take 1/2 Tablet Once Daily 22)  Prednisone 5 Mg Tabs (Prednisone) .... Take One By Mouth Once Daily 23)  Vitamin D3 1000 Unit Tabs (Cholecalciferol) .... Two Tablet By Mouth Once Daily 24)  Polyethylene Glycol 3350  Powd (Polyethylene Glycol 3350) .Marland Kitchen.. 1 1/2 Tablespoons in Liquid Two Times A Day 25)  Oxycodone Hcl 5 Mg Tabs (Oxycodone Hcl) .Marland Kitchen.. 1 By  Mouth Every 4 Hours As Needed  Allergies: 1)  ! Sulfa 2)  ! * Dilaudid 3)  ! Codeine 4)  ! Morphine 5)  ! * Shellfish 6)  ! Demerol 7)  ! Zetia (Ezetimibe) 8)  ! Augmentin 9)  Morphine Sulfate (Morphine Sulfate)    Updated Prior Medication List: METOPROLOL TARTRATE 25 MG  TABS (METOPROLOL TARTRATE) 1/2 by mouth two times a day ISOSORBIDE MONONITRATE CR 60 MG  TB24 (ISOSORBIDE MONONITRATE) 1 by mouth once daily DILTIAZEM HCL ER BEADS 180 MG  CP24 (DILTIAZEM HCL ER BEADS) 1 by mouth qd FUROSEMIDE 40 MG  TABS (FUROSEMIDE) 1 by mouth qd OMEPRAZOLE 20 MG  TBEC (OMEPRAZOLE) Take 1 tablet by mouth once a day LANTUS 100 UNIT/ML  SOLN (INSULIN GLARGINE) 20  units at bedtime. VITAMIN C  500 MG TABS (ASCORBIC ACID) 1 by mouth once daily AMITRIPTYLINE HCL 10 MG TABS (AMITRIPTYLINE HCL) 1 tablet at bedtime B-1 250 MG TABS (THIAMINE HCL) 1/2 by mouth once daily LACTULOSE 10 GM/15ML SOLN (LACTULOSE) take 1 and 1/2 tablespoons two times a day FREESTYLE LANCETS  MISC (LANCETS) TEST 2-3 TIMES A DAY  DX 250.02 FREESTYLE TEST  STRP (GLUCOSE BLOOD) TEST 2-3 TIMES A DAY DX 250.02 SPIRONOLACTONE 50 MG TABS (SPIRONOLACTONE) Take 1 tablet by mouth once a day FERROUS SULFATE 325 (65 FE) MG  TABS (FERROUS SULFATE) 1 by mouth bid HUMALOG 100 UNIT/ML SOLN (INSULIN LISPRO (HUMAN)) 40 units (just before each meal) INSULIN SYRINGE 31G X 5/16" 0.5 ML MISC (INSULIN SYRINGE-NEEDLE U-100) qid CALCIUM/MAGNESIUM/ZINC FORMULA 1000-500-50 MG TABS (CALCIUM-MAGNESIUM-ZINC) Take one tablet by mouth once daily. VITAMIN E 400 UNIT CAPS (VITAMIN E) once daily TALWIN 30 MG/ML SOLN (PENTAZOCINE LACTATE) as needed for pain XIFAXAN 550 MG TABS (RIFAXIMIN) Take 1 tablet by mouth two times a day ALLOPURINOL 300 MG TABS (ALLOPURINOL) Take 1/2 tablet once daily PREDNISONE 5 MG TABS (PREDNISONE) take one by mouth once daily VITAMIN D3 1000 UNIT TABS (CHOLECALCIFEROL) two tablet by mouth once daily POLYETHYLENE GLYCOL 3350  POWD (POLYETHYLENE GLYCOL 3350) 1 1/2 tablespoons in liquid two times a day OXYCODONE HCL 5 MG TABS (OXYCODONE HCL) 1 by mouth every 4 hours as needed  Current Allergies (reviewed today): ! SULFA ! * DILAUDID ! CODEINE ! MORPHINE ! * SHELLFISH ! DEMEROL ! ZETIA (EZETIMIBE) ! AUGMENTIN MORPHINE SULFATE (MORPHINE SULFATE) Past History:  Past medical, surgical, family and social histories (including risk factors) reviewed, and no changes noted (except as noted below).  Past Medical History: Reviewed history from 05/26/2009 and no changes required. Allergic rhinitis GERD Osteoarthritis Alcoholic cirrhosis, Thrombocytopenia, Bleeding Esophageal Varices, s/p TIPS 2/10,  encephalopathy Anemia, multifactorial Internal Hemorrhoids Colon Polyps adenomatous Diverticulosis Gastric Antral Vascular Ectasia Rhinophyma Melanoma resected by Moh's 2008 Diabetes, Type 2 CAD....myoview  2007  no ischemia / no ASA because of GI disease LV function .Marland Kitchen..echo September, 2010.Marland Kitchen EF 55%.. mild AI... aortic valve sclerosis Hypertension CABG...2004 Cellulitis RLE 10/2008 Sylva ,[place] Atrial Fibrillation...amiodarone in past, but problems and left off meds. Gout Sleep Apnea.Marland KitchenMarland KitchenCPAP Carotid bruits???  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 Colonoscopy 2005 Dr Leone Payor, 12/08 also 6 Bypass Total knee replacement- right knee EGD 12/08, 12/09, 2/10 TIPS 2/10 laminectomy x 4  hemorrhoidectomy inguinal herniograpphy contracture repair-bilaterally on hands right arm surgery hiatal hernia repair  Family History: Reviewed history from 08/29/2008 and no changes required. Father: CA  GI (stomach ???) Mother: Alzheimer's Siblings: MIs,CVAs, CA (melanoma) Family History of Diabetes: Brothers Family History of Heart Disease: 3 Brothers, 2 sisters Family History of Liver Disease/Cirrhosis:brother, sister No FH of Colon Cancer:  Social History: Reviewed history from 09/30/2008 and no changes required. Alcohol Use - None since Feb. 2010 Illicit Drug Use - no Patient does not get regular exercise.  Patient is a former smoker. -stopped over 40 years ago Occupation: retired married  Review of Systems       FSG "a little high" 125 this AM, has regular ophtho f/u. has numbness in his feet, mild; has burning pain;nasal congestion for 2-3 yrs; [facility].   Vital Signs:  Patient profile:   75 year old male Height:      76 inches (193.04 cm) Weight:      241.0 pounds (109.55  kg) BMI:     29.44 Temp:     97.6 degrees F (36.44 degrees C) oral Pulse rate:   62 / minute BP sitting:   129 / 64  (left arm)  Vitals Entered By: Baxter Hire) (November 18, 2009 11:11 AM) CC: new patient / pseudo/cellulitis Pain Assessment Patient in pain? yes     Location: knees and lower back Intensity: 4 Type: aching Onset of pain  Constant Nutritional Status BMI of 25 - 29 = overweight Nutritional Status Detail appetite is good per patient  Does patient need assistance? Functional Status Self care Ambulation Normal Comments patient walks with a cane.   Physical Exam  General:  well-developed, well-nourished, well-hydrated, and overweight-appearing.   Eyes:  pupils equal, pupils round, and pupils reactive to light.   Nose:  external deformity and external erythema.  (rhnophyma) Mouth:  pharynx pink and moist and no exudates.   Neck:  no masses.   Lungs:  normal respiratory effort and normal breath sounds.   Heart:  normal rate, regular rhythm, and Grade   2-3/6 systolic ejection murmur.   Abdomen:  soft, non-tender, and normal bowel sounds.   Extremities:  left pretibial edema and right pretibial edema.  RLE has no increase in warmth, no erythema.  he has onychomycosis.  RUE PIC clean, nontender, no d/c.    Impression & Recommendations:  Problem # 1:  CELLULITIS, LEG, RIGHT (ICD-682.6)  will recheck his BCx today as well as remove his PIC line. will start him on PEN VL 250mg  (denies allergy) once daily for prophylaxis for recurrent cellulitis. his prev Cx show a Strep but no staph.  He has as least  4 risk factors for recurrentl cellulitis (cirrhosis with edema, DM, onychomycosis, venous/lymphatic insuff from prev CABG donor). Will try a topical medicine for his onychomycosis but this is unlikely to cure him. I would be very hesistant to give him terbenafine orally given his hx of cirrhosis.  will see him back in 1 month.  The following medications were  removed from the medication list:    Minocycline Hcl 50 Mg Caps (Minocycline hcl) ..... One capsule by mouth two times a day    Levaquin 750 Mg/121ml Soln (Levofloxacin in d5w) ..... Iv/pic line His updated medication list for this problem includes:    Talwin 30 Mg/ml Soln (Pentazocine lactate) .Marland Kitchen... As needed for pain    Xifaxan 550 Mg Tabs (Rifaximin) .Marland Kitchen... Take 1 tablet by mouth two times a day    Oxycodone Hcl 5 Mg Tabs (Oxycodone hcl) .Marland Kitchen... 1 by mouth every 4 hours as needed    Penicillin V  Potassium 250 Mg Tabs (Penicillin v potassium) .Marland Kitchen... Take 1 tablet by mouth once a day  Orders: T-Culture, Blood Routine ([id number]) T-Culture, Blood Routine ([id number]) Consultation Level IV (65784)  Medications Added to Medication List This Visit: 1)  Penicillin V Potassium 250 Mg Tabs (Penicillin v potassium) .... Take 1 tablet by mouth once a day 2)  Athletes Foot 1 % Crea (Terbinafine hcl) .... Apply two times a day to toes/nails for 1 month Prescriptions: ATHLETES FOOT 1 % CREA (TERBINAFINE HCL) apply two times a day to toes/nails for 1 month  #30 days x 1   Entered and Authorized by:   Johny Sax MD   Signed by:   Johny Sax MD on 11/18/2009   Method used:   Electronically to        [facility] [address] (retail)       [address], [address]       [place], [place]  [address]       Ph: [phone]       Fax: [phone]   RxID:   [id number] PENICILLIN V POTASSIUM 250 MG TABS (PENICILLIN V POTASSIUM) Take 1 tablet by mouth once a day  #90 x 3   Entered and Authorized by:   Johny Sax MD   Signed by:   Johny Sax MD on 11/18/2009   Method used:   Electronically to        [facility] [address] (retail)       [address], [address]       [place], [place]  [address]       Ph: [phone]       Fax: [phone]   RxID:   [id number]   Appended Document: 11:15new pt  pseudo/cellulitis Order to draw lab from PICC for blood culture from Dr. Ninetta Lights:  Pt. identified w/ name and DOB.  Donned gloves.  Clean tubing/hub connection cap with CHG wipe for 20 seconds, using scrubbing motion.  Attached empty, sterile 20 cc syringe, opened clamp, withdrew 10 cc of waste and set aside.  Attached 20cc syringe and withrew 20 cc of blood, transferred to blood culture bottles.  Pt. to have second set of blood cultures drawn from left arm in the lab  Order to remove PICC line obtained from Dr. Ninetta Lights.  Pt. identified with name and date of birth.  Assessed that pt. has bruising under dressing and on the left forearm.  Wife and pt. mentioned that the pt. has a low platelet count.  Pt. placed in reclining position on the exam table for better access to PICC site.  PICC dressing removed, entry site slightly red.  Requested Dr. Ninetta Lights to assess site.  OKed removal of PICC.  PICC line removed using sterile technique @ 1225 pm.   PICC length equal to that noted in pt's hospital chart of 43 cm.  Sterile petroleum gauze + sterile 4X4 applied to PICC site, pressure applied for 10 minutes and covered with Medipore tape as a pressure dressing.  Pt. instructed to limit use of arm for 1 hour.  Pt. instructed that the pressure dressing should remain in place for 24 hours.  Pt. verablized understanding of these instructions.

## 2010-03-25 NOTE — Assessment & Plan Note (Signed)
Summary: follow up encephalopathy/sheri   History of Present Illness Visit Type: Follow-up Visit Primary GI MD: Stan Head MD [facility] Primary Provider: Marga Melnick, MD Requesting Provider: n/a Chief Complaint: Patient here to follow up on his encephalopathy, states he feels good, just has some more back pain. 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  History of Present Illness:   Seeing Dr. Allyne Gee tomorrow retina specuialist toi follow-up "eye stroke") No confusion per patient and wife. He saw rheumatology Dierdre Forth) and started low-dose prednisone which is helping gout, back still hurts. No significant edema. Shakes are less [employer] of westerns on TV Overall sig better than 1 month ago.    GI Review of Systems      Denies abdominal pain, acid reflux, belching, bloating, chest pain, dysphagia with liquids, dysphagia with solids, heartburn, loss of appetite, nausea, vomiting, vomiting blood, weight loss, and  weight gain.        Denies anal fissure, change in bowel habit, constipation, diarrhea, diverticulosis, fecal incontinence, heme positive stool, hemorrhoids, irritable bowel syndrome, jaundice, light color stool, liver problems, rectal bleeding, and  rectal pain.    Current Medications (verified): 1)  Metoprolol Tartrate 25 Mg  Tabs (Metoprolol Tartrate) .... 1/2 By Mouth Two Times A Day 2)  Isosorbide Mononitrate Cr 60 Mg  Tb24 (Isosorbide Mononitrate) .Marland Kitchen.. 1 By Mouth Once Daily 3)  Diltiazem Hcl Er Beads 180 Mg  Cp24 (Diltiazem Hcl Er Beads) .Marland Kitchen.. 1 By Mouth Qd 4)  Furosemide 40 Mg  Tabs (Furosemide) .Marland Kitchen.. 1 By Mouth Qd 5)  Omeprazole 20 Mg  Tbec (Omeprazole) .... Take 1 Tablet By Mouth Two Times A Day 6)  Lantus 100 Unit/ml  Soln (Insulin Glargine) .... 25  Units At Bedtime. 7)  Vitamin C 500 Mg Tabs (Ascorbic Acid) .Marland Kitchen.. 1 By Mouth Once Daily 8)  Amitriptyline Hcl 10 Mg Tabs (Amitriptyline Hcl) .Marland Kitchen.. 1 Tablet At Bedtime 9)  B-1 250 Mg Tabs  (Thiamine Hcl) .... 1/2 By Mouth Once Daily 10)  Lactulose  Soln (Lactulose) .... Take 1 1/2  Tablespoon By Mouth Two Times A Day 11)  Freestyle Lancets  Misc (Lancets) .... Test 2-3 Times A Day  Dx 250.02 12)  Freestyle Test  Strp (Glucose Blood) .... Test 2-3 Times A Day Dx 250.02 13)  Spironolactone 50 Mg Tabs (Spironolactone) .... Take 1 Tablet By Mouth Once A Day 14)  Miralax   Powd (Polyethylene Glycol 3350) .... Take 1 and 1/2 Tablespoon Twice Daily 15)  Ferrous Sulfate 325 (65 Fe) Mg  Tabs (Ferrous Sulfate) .Marland Kitchen.. 1 By Mouth Bid 16)  Humalog 100 Unit/ml Soln (Insulin Lispro (Human)) .... 30 Units (Just Before Each Meal) 17)  Insulin Syringe 31g X 5/16" 0.5 Ml Misc (Insulin Syringe-Needle U-100) .... Qid 18)  Calcium/magnesium/zinc Formula 1000-500-50 Mg Tabs (Calcium-Magnesium-Zinc) .... Take One Tablet By Mouth Once Daily. 19)  Pentazocine-Naloxone Hcl 50-0.5 Mg Tabs (Pentazocine-Naloxone) .... Uad 20)  Vitamin E 400 Unit Caps (Vitamin E) .... Once Daily 21)  Talwin 30 Mg/ml Soln (Pentazocine Lactate) .... As Needed For Pain 22)  Xifaxan 550 Mg Tabs (Rifaximin) .... Take 1 Tablet By Mouth Two Times A Day 23)  Allopurinol 300 Mg Tabs (Allopurinol) .... Take 1/2 Tablet Once Daily 24)  Prednisone 5 Mg Tabs (Prednisone) .... Take One By Mouth Once Daily  Allergies (verified): 1)  ! Sulfa 2)  ! * Dilaudid 3)  ! Codeine 4)  ! Morphine 5)  ! * Shellfish 6)  !  Demerol 7)  ! Zetia (Ezetimibe) 8)  ! Augmentin 9)  Morphine Sulfate (Morphine Sulfate) 10)  Oxycontin (Oxycodone Hcl) 11)  Sulfamethoxazole (Sulfamethoxazole) 12)  Codeine Phosphate (Codeine Phosphate)  Past History:  Family History: Last updated: 08/29/2008 Father: CA GI (stomach ???) Mother: Alzheimer's Siblings: MIs,CVAs, CA (melanoma) Family History of Diabetes: Brothers Family History of Heart Disease: 3 Brothers, 2 sisters Family History of Liver Disease/Cirrhosis:brother, sister No FH of Colon Cancer:  Social  History: Last updated: 09/30/2008 Alcohol Use - None since Feb. 2010 Illicit Drug Use - no Patient does not get regular exercise.  Patient is a former smoker. -stopped over 40 years ago Occupation: retired married  Past Medical History: Reviewed history from 12/18/2008 and no changes required. 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 mild AI... aortic valve sclerosis Hypertension CABG...2004 Cellulitis RLE 10/2008 [place] ,[place] Atrial Fibrillation...amiodarone in past, but problems and left off meds. Gout Sleep Apnea.Marland KitchenMarland KitchenCPAP Carotid bruits???  but dopplers OK Barrett's Esophagus (short-segment) suspected  Past Surgical History: Reviewed history from 10/20/2008 and no changes required. Colonoscopy 2005 Dr Leone Payor, 12/08 also 6 Bypass Total knee replacement- right knee EGD 12/08, 12/09, 2/10 TIPS 2/10 laminectomy x 4  hemorrhoidectomy inguinal herniograpphy contracture repair-bilaterally on hands right arm surgery hiatal hernia repair  Social History: Reviewed history from 09/30/2008 and no changes required. Alcohol Use - None since Feb. 2010 Illicit Drug Use - no Patient does not get regular exercise.  Patient is a former smoker. -stopped over 40 years ago Occupation: retired married  Vital Signs:  Patient profile:   75 year old male Height:      76 inches Weight:      226.8 pounds BMI:     27.71 Pulse rate:   64 / minute Pulse rhythm:   regular BP sitting:   130 / 74  (left arm) Cuff size:   regular  Vitals Entered By: Harlow Mares CMA Duncan Dull) (March 17, 2009 9:55  AM)  Physical Exam  General:  Chronically ill, somewhat Eyes:  anicteric  Lungs:  Clear throughout to auscultation. Heart:  Regular rate and rhythm; no murmurs, rubs,  or bruits. Abdomen:  soft, non-tender, BS + medium ventral hernia and small umbilical hernia firm liver palable RUQ no splenomegaly Extremities:  TED hose on right and left  Neurologic:  A&o x 3 no asterixis no tremor   Impression & Recommendations:  Problem # 1:  CIRRHOSIS, ALCOHOLIC (ICD-571.2) Assessment Comment Only  Orders: TLB-BMP (Basic Metabolic Panel-BMET) (80048-METABOL)  Problem # 2:  HEPATIC ENCEPHALOPATHY (ICD-572.2) Assessment: Improved doing well on curent regimen of Xifaxan and lactulose peripheral edema has not recured (previosuly thought related to Xifaxan by hospitalist)  Problem # 3:  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 Arbie Cookey who thought venous insufficiency was likely and that diuretics and compression stockings should be continued no other work-up or treatment at this time  Problem # 5:  VENOUS INSUFFICIENCY, CHRONIC (ICD-459.81) Assessment: New : the quality or state of being accountable; especially : an obligation or willingness to  accept responsibility or to account for one's actions <public officials lacking accountability>  Patient Instructions: 1)  Please continue current medications.  2)  Please go to the basement to have your lab tests drawn today. 3)  Please schedule a follow-up appointment in 3 months. 4)  Copy sent to : Marga Melnick, MD, Vira Browns, MD, Stephannie Li, MD, and Dr. Dierdre Forth 5)  The medication list was reviewed and reconciled.  All changed / newly prescribed medications were explained.  A complete medication list was provided to  the patient / caregiver.  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 mild AI... aortic valve sclerosis    Hypertension    CABG...2004    Cellulitis RLE 10/2008 [place] ,[place]    Atrial Fibrillation...amiodarone in past, but problems and left off meds.    Gout    Sleep Apnea.Marland KitchenMarland KitchenCPAP    Carotid bruits???  but dopplers OK    Barrett's Esophagus (short-segment) suspected    Venous insufficinecy   Appended Document: follow up encephalopathy/sheri accountability definition under venous insufficiency is a typographical error - disregard

## 2010-03-25 NOTE — Progress Notes (Signed)
Summary: Medication Concerns  Phone Note Call from Patient Call back at Home Phone [phone]   Caller: Patient Summary of Call: Message left on VM: Please call about Metoprolol, right source never received. Spoke with patient's wife and informed her we faxed 09/01/2009 # 90 with 2 additional refills. Patient's wife indicated they received rx for amitriptyline but not metoprolol. I will re-send to [phone]    .Shonna Chock CMA  September 28, 2009 3:53 PM     Prescriptions: METOPROLOL TARTRATE 25 MG  TABS (METOPROLOL TARTRATE) 1/2 by mouth two times a day  #90 x 2   Entered by:   Shonna Chock CMA   Authorized by:   Marga Melnick MD   Signed by:   Shonna Chock CMA on 09/28/2009   Method used:   Print then Give to Patient   RxID:   [id number]

## 2010-03-25 NOTE — Miscellaneous (Signed)
Summary: HIPAA Restrictions  HIPAA Restrictions   Imported By: Florinda Marker 11/18/2009 11:13:14  _____________________________________________________________________  External Attachment:    Type:   Image     Comment:   External Document

## 2010-03-25 NOTE — Letter (Signed)
Summary: [facility]   Imported By: Lanelle Bal 03/05/2009 09:06:11  _____________________________________________________________________  External Attachment:    Type:   Image     Comment:   External Document

## 2010-03-25 NOTE — Assessment & Plan Note (Signed)
Summary: FOLLOW UP CIRRHOSIS//SP   History of Present Illness Primary GI MD: Stan Head MD [facility] Primary Provider: Marga Melnick, MD  Requesting Provider: n/a Chief Complaint: f/u Cirrhosis, pt denies any abdominal swelling but has some right knee pain and swelling in both legs.  History of Present Illness:   75 yo wm with alcoholic cirrhosis here for routine follow-up  has had chronc LE edema and now with erythema in LE's x 3 days or so no fever LE's tender to touch  this seems similar to what is described in Dr. Lenord Fellers note of Nov - was left on PenVK prohylaxis then.  Wife says he has some days here and there when he sleeps alot few times a month.  Is trying to keep legs elevated.  Weight up 9-10 # since Nov  [place] Sept ok    GI Review of Systems      Denies abdominal pain, acid reflux, belching, bloating, chest pain, dysphagia with liquids, dysphagia with solids, heartburn, loss of appetite, nausea, vomiting, vomiting blood, weight loss, and  weight gain.      Reports liver problems.     Denies anal fissure, black tarry stools, change in bowel habit, constipation, diarrhea, diverticulosis, fecal incontinence, heme positive stool, hemorrhoids, irritable bowel syndrome, jaundice, light color stool, rectal bleeding, and  rectal pain.    Current Medications (verified): 1)  Metoprolol Tartrate 25 Mg  Tabs (Metoprolol Tartrate) .... 1/2 By Mouth Two Times A Day 2)  Isosorbide Mononitrate Cr 60 Mg  Tb24 (Isosorbide Mononitrate) .Marland Kitchen.. 1 By Mouth Once Daily 3)  Diltiazem Hcl Er Beads 180 Mg  Cp24 (Diltiazem Hcl Er Beads) .Marland Kitchen.. 1 By Mouth Qd 4)  Furosemide 40 Mg  Tabs (Furosemide) .Marland Kitchen.. 1 By Mouth Qd 5)  Omeprazole 20 Mg  Tbec (Omeprazole) .... Take 1 Tablet By Mouth Once A Day 6)  Lantus 100 Unit/ml  Soln (Insulin Glargine) .... 20  Units At Bedtime. 7)  Vitamin C 500 Mg Tabs (Ascorbic Acid) .Marland Kitchen.. 1 By Mouth Once Daily 8)  Amitriptyline Hcl 10 Mg Tabs (Amitriptyline Hcl) .Marland Kitchen.. 1  Tablet At Bedtime 9)  B-1 250 Mg Tabs (Thiamine Hcl) .... 1/2 By Mouth Once Daily 10)  Lactulose 10 Gm/12ml Soln (Lactulose) .... Take 1 and 1/2 Tablespoons Two Times A Day 11)  Freestyle Lancets  Misc (Lancets) .... Test 2-3 Times A Day  Dx 250.02 12)  Freestyle Lite Test  Strp (Glucose Blood) .... Test 2-3 Times Daily Dx 250.02 13)  Spironolactone 50 Mg Tabs (Spironolactone) .... Take 1 Tablet By Mouth Once A Day 14)  Ferrous Sulfate 325 (65 Fe) Mg  Tabs (Ferrous Sulfate) .Marland Kitchen.. 1 By Mouth Bid 15)  Humalog 100 Unit/ml Soln (Insulin Lispro (Human)) .... 42 Units Three Times A Day (Just Before Each Meal) 16)  Insulin Syringe 31g X 5/16" 0.5 Ml Misc (Insulin Syringe-Needle U-100) .... Qid 17)  Calcium/magnesium/zinc Formula 1000-500-50 Mg Tabs (Calcium-Magnesium-Zinc) .... Take One Tablet By Mouth Once Daily. 18)  Vitamin E 400 Unit Caps (Vitamin E) .... Once Daily 19)  Talwin 30 Mg/ml Soln (Pentazocine Lactate) .... As Needed For Pain 20)  Xifaxan 550 Mg Tabs (Rifaximin) .... Take 1 Tablet By Mouth Two Times A Day 21)  Allopurinol 300 Mg Tabs (Allopurinol) .... Take 1/2 Tablet Once Daily 22)  Prednisone 5 Mg Tabs (Prednisone) .... Take One By Mouth Once Daily 23)  Vitamin D3 1000 Unit Tabs (Cholecalciferol) .... Two Tablet By Mouth Once Daily 24)  Polyethylene Glycol 3350  Powd (Polyethylene Glycol 3350) .Marland Kitchen.. 1 1/2 Tablespoons in Liquid Two Times A Day 25)  Penicillin V Potassium 250 Mg Tabs (Penicillin V Potassium) .... Take 1 Tablet By Mouth Once A Day 26)  Athletes Foot 1 % Crea (Terbinafine Hcl) .... Apply Two Times A Day To Toes/nails For 1 Month 27)  Neurontin 100 Mg Caps (Gabapentin) .... Take 2 Three Times Daily 28)  Vitamin B-6 100 Mg Tabs (Pyridoxine Hcl) .... One Tablet By Mouth Once Daily  Allergies (verified): 1)  ! Sulfa 2)  ! * Dilaudid 3)  ! Codeine 4)  ! Morphine 5)  ! * Shellfish 6)  ! Demerol 7)  ! Zetia (Ezetimibe) 8)  ! Augmentin 9)  ! 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 mild AI... aortic valve sclerosis Hypertension CABG...2004 Cellulitis RLE -recurrent Atrial Fibrillation...amiodarone in past, but problems and left off meds. Gout Sleep Apnea.Marland KitchenMarland KitchenCPAP Carotid bruits???  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 Colonoscopy 2005 Dr Leone Payor, 12/08 also 6 Bypass Total knee replacement- right knee EGD 12/08, 12/09, 2/10 TIPS 2/10 laminectomy x 4  hemorrhoidectomy inguinal herniograpphy contracture repair-bilaterally on hands right arm surgery hiatal hernia repair  Family History: Reviewed history from 08/29/2008 and no changes required. Father: [place] GI (stomach ???) Mother: Alzheimer's Siblings: MIs,CVAs, CA (melanoma) Family History of Diabetes: Brothers Family History of Heart Disease: 3 Brothers, 2 sisters Family History of Liver Disease/Cirrhosis:brother, sister No FH of Colon Cancer:  Social History: Reviewed history from 09/30/2008 and no changes required. Alcohol Use - None since Feb. 2010 Illicit Drug Use - no Patient does not get regular exercise.  Patient is a former smoker. -stopped over 40 years ago Occupation: retired married  Review of Systems       back pain persists, steroid ijection last month helped some on wellbutrn per [place], could not take full dose  due to sleepines trying to stay busy, fatigue interferes some some insonia at times All other ROS negative except as per HPI.   Vital Signs:  Patient profile:   75 year old male Height:      76 inches Weight:      258.13 pounds BMI:     31.53 Pulse rate:   64 / minute Pulse rhythm:   regular BP sitting:   128 / 74  (right arm) Cuff size:   regular  Vitals Entered By: Christie Nottingham CMA Duncan Dull) (March 16, 2010 8:26 AM)  Physical Exam  General:  obese.  no distress  Eyes:  anicteric Neck:  supple Breasts:  mildly enlarged but nontender Lungs:  Clear throughout to auscultation. Heart:  normal rate, regular rhythm, and Grade   2-3/6 systolic ejection murmur RUSB Abdomen:  obese, midline hernia BS+ soft, bulging flanks no HSM palpable Extremities:  right more than left LE edema 2+ edema somewhat pitting into upper pretibal area on right trace -1+ edema on left minimal erythema and not warm erythema and slight warmth on right (wears support hose at home)  Neurologic:  Alert and  oriented x3 no asterixis Psych:  mildly flat   Impression & Recommendations:  Problem # 1:  CIRRHOSIS, ALCOHOLIC (ICD-571.2) Assessment Unchanged This itself seems stable though ? if he has recurrent ascites and edema as cause of weight gain. (Know he has edema) Orders: TLB-CBC Platelet - w/Differential (85025-CBCD) TLB-CMP (Comprehensive Metabolic Pnl) (80053-COMP) TLB-PT (Protime) (85610-PTP)  Problem # 2:  VENOUS INSUFFICIENCY, CHRONIC (ICD-459.81) Assessment: Deteriorated no cellulitis per my exam - seems similar to what Dr. Ninetta Lights saw 12/2009. He is on PenVK suppressive. edema may be adding to problem and once labs reviewed will likely increase or change diuretics  Problem # 3:  HEPATIC ENCEPHALOPATHY (ICD-572.2) Assessment: Unchanged ok stable on lactulose and Xifaxan  Problem # 4:  GYNECOMASTIA (ICD-611.1) Assessment: Unchanged not really a problem now at 50 mg  spitronolactone may need different diuretic that does not cause this vs just increasing furosemide  Problem # 5:  WEIGHT GAIN (ICD-783.1) edema? ascites? both? await labs likely needs an increased diuretic(s)  Patient Instructions: 1)  Copy sent to : Dr Alwyn Ren, Dr Myrtis Ser, Dr Otelia Sergeant 2)  You will have labs done today at our basement lab. 3)  We will contact you with results and timing of your next follow up. 4)  The medication list was reviewed and reconciled.  All changed / newly prescribed medications were explained.  A complete medication list was provided to the patient / caregiver.

## 2010-03-25 NOTE — Progress Notes (Addendum)
Summary: Negative venous doppler  Phone Note Other Incoming   Caller: Vascular Lab Summary of Call: Vascular lab called to report a negative venous doppler on pts. RLE Initial call taken by: Wendall Mola CMA Duncan Dull),  March 18, 2010 10:41 AM

## 2010-03-25 NOTE — Progress Notes (Signed)
Summary: refill  Phone Note Refill Request Message from:  Fax from Pharmacy  Refills Requested: Medication #1:  AMITRIPTYLINE HCL 10 MG TABS 1 tablet at bedtime  Medication #2:  ALLOPURINOL 300 MG TABS Take 1/2 tablet once daily right source - fax [phone]  Initial call taken by: Okey Regal [place],  March 17, 2010 8:50 AM  Follow-up for Phone Call        RX faxed to: [phone]  Follow-up by: Shonna Chock CMA,  March 17, 2010 8:57 AM    Prescriptions: ALLOPURINOL 300 MG TABS (ALLOPURINOL) Take 1/2 tablet once daily  #45 x 2   Entered by:   Shonna Chock CMA   Authorized by:   Marga Melnick MD   Signed by:   Shonna Chock CMA on 03/17/2010   Method used:   Print then Give to Patient   RxID:   [id number] AMITRIPTYLINE HCL 10 MG TABS (AMITRIPTYLINE HCL) 1 tablet at bedtime  #90 x 1   Entered by:   Shonna Chock CMA   Authorized by:   Marga Melnick MD   Signed by:   Shonna Chock CMA on 03/17/2010   Method used:   Print then Give to Patient   RxID:   [id number]

## 2010-03-25 NOTE — Assessment & Plan Note (Signed)
Summary: 7month f/u/vs   Referring Provider:  na  Primary Provider:  Marga Melnick, MD   CC:  f/u leg infection, still in pain, and red and swollen.  History of Present Illness: 75 yo M with hx of DM2 and alcoholic chirrhosis s/p tips. He was am to [facility] hospital 10-23-09 to 9-7-11for fever, nausea, vomiting, chills, right lower extremity cellulitis. He had BCx drawn on adm and was found to have 1/2 + for Pseudomonas aeruginosa (pan-sens). He was d/c home on IV levaquin.  Had no problems with antibiotics. Completed 11-11-09. Had f/u BCx 11-19-09 negative. At his f/u visit was started on Pen VK for prophylaxis for his recurrent cellulitis.  Today c/o worsening sweling for the past 3 days and increased "pink" as well per wife. No f/c. FSG have been running high as well 150-190.  290 this AM. Has been taking PEN orally as well was started on Neurontin by Dr Otelia Sergeant.   Preventive Screening-Counseling & Management  Alcohol-Tobacco     Alcohol drinks/day: patient has quit / February of 2010     Alcohol type: binge drinking     Smoking Status: quit     Year Quit: 1964  Current Medications (verified): 1)  Metoprolol Tartrate 25 Mg  Tabs (Metoprolol Tartrate) .... 1/2 By Mouth Two Times A Day 2)  Isosorbide Mononitrate Cr 60 Mg  Tb24 (Isosorbide Mononitrate) .Marland Kitchen.. 1 By Mouth Once Daily 3)  Diltiazem Hcl Er Beads 180 Mg  Cp24 (Diltiazem Hcl Er Beads) .Marland Kitchen.. 1 By Mouth Qd 4)  Furosemide 40 Mg  Tabs (Furosemide) .Marland Kitchen.. 1 By Mouth Qd 5)  Omeprazole 20 Mg  Tbec (Omeprazole) .... Take 1 Tablet By Mouth Once A Day 6)  Lantus 100 Unit/ml  Soln (Insulin Glargine) .... 20  Units At Bedtime. 7)  Vitamin C 500 Mg Tabs (Ascorbic Acid) .Marland Kitchen.. 1 By Mouth Once Daily 8)  Amitriptyline Hcl 10 Mg Tabs (Amitriptyline Hcl) .Marland Kitchen.. 1 Tablet At Bedtime 9)  B-1 250 Mg Tabs (Thiamine Hcl) .... 1/2 By Mouth Once Daily 10)  Lactulose 10 Gm/51ml Soln (Lactulose) .... Take 1 and 1/2 Tablespoons Two Times A Day 11)  Freestyle Lancets  Misc  (Lancets) .... Test 2-3 Times A Day  Dx 250.02 12)  Freestyle Test  Strp (Glucose Blood) .... Test 2-3 Times A Day Dx 250.02 13)  Spironolactone 50 Mg Tabs (Spironolactone) .... Take 1 Tablet By Mouth Once A Day 14)  Ferrous Sulfate 325 (65 Fe) Mg  Tabs (Ferrous Sulfate) .Marland Kitchen.. 1 By Mouth Bid 15)  Humalog 100 Unit/ml Soln (Insulin Lispro (Human)) .... 40 Units (Just Before Each Meal) 16)  Insulin Syringe 31g X 5/16" 0.5 Ml Misc (Insulin Syringe-Needle U-100) .... Qid 17)  Calcium/magnesium/zinc Formula 1000-500-50 Mg Tabs (Calcium-Magnesium-Zinc) .... Take One Tablet By Mouth Once Daily. 18)  Vitamin E 400 Unit Caps (Vitamin E) .... Once Daily 19)  Talwin 30 Mg/ml Soln (Pentazocine Lactate) .... As Needed For Pain 20)  Xifaxan 550 Mg Tabs (Rifaximin) .... Take 1 Tablet By Mouth Two Times A Day 21)  Allopurinol 300 Mg Tabs (Allopurinol) .... Take 1/2 Tablet Once Daily 22)  Prednisone 5 Mg Tabs (Prednisone) .... Take One By Mouth Once Daily 23)  Vitamin D3 1000 Unit Tabs (Cholecalciferol) .... Two Tablet By Mouth Once Daily 24)  Polyethylene Glycol 3350  Powd (Polyethylene Glycol 3350) .Marland Kitchen.. 1 1/2 Tablespoons in Liquid Two Times A Day 25)  Oxycodone Hcl 5 Mg Tabs (Oxycodone Hcl) .Marland Kitchen.. 1 By Mouth Every 4 Hours  As Needed 26)  Penicillin V Potassium 250 Mg Tabs (Penicillin V Potassium) .... Take 1 Tablet By Mouth Once A Day 27)  Athletes Foot 1 % Crea (Terbinafine Hcl) .... Apply Two Times A Day To Toes/nails For 1 Month 28)  Neurontin 100 Mg Caps (Gabapentin) .... Take 1 Three Times Daily  Allergies: 1)  ! Sulfa 2)  ! * Dilaudid 3)  ! Codeine 4)  ! Morphine 5)  ! * Shellfish 6)  ! Demerol 7)  ! Zetia (Ezetimibe) 8)  ! Augmentin 9)  Morphine Sulfate (Morphine Sulfate)    Updated Prior Medication List: METOPROLOL TARTRATE 25 MG  TABS (METOPROLOL TARTRATE) 1/2 by mouth two times a day ISOSORBIDE MONONITRATE CR 60 MG  TB24 (ISOSORBIDE MONONITRATE) 1 by mouth once daily DILTIAZEM HCL ER BEADS  180 MG  CP24 (DILTIAZEM HCL ER BEADS) 1 by mouth qd FUROSEMIDE 40 MG  TABS (FUROSEMIDE) 1 by mouth qd OMEPRAZOLE 20 MG  TBEC (OMEPRAZOLE) Take 1 tablet by mouth once a day LANTUS 100 UNIT/ML  SOLN (INSULIN GLARGINE) 20  units at bedtime. VITAMIN C 500 MG TABS (ASCORBIC ACID) 1 by mouth once daily AMITRIPTYLINE HCL 10 MG TABS (AMITRIPTYLINE HCL) 1 tablet at bedtime B-1 250 MG TABS (THIAMINE HCL) 1/2 by mouth once daily LACTULOSE 10 GM/15ML SOLN (LACTULOSE) take 1 and 1/2 tablespoons two times a day FREESTYLE LANCETS  MISC (LANCETS) TEST 2-3 TIMES A DAY  DX 250.02 FREESTYLE TEST  STRP (GLUCOSE BLOOD) TEST 2-3 TIMES A DAY DX 250.02 SPIRONOLACTONE 50 MG TABS (SPIRONOLACTONE) Take 1 tablet by mouth once a day FERROUS SULFATE 325 (65 FE) MG  TABS (FERROUS SULFATE) 1 by mouth bid HUMALOG 100 UNIT/ML SOLN (INSULIN LISPRO (HUMAN)) 40 units (just before each meal) INSULIN SYRINGE 31G X 5/16" 0.5 ML MISC (INSULIN SYRINGE-NEEDLE U-100) qid CALCIUM/MAGNESIUM/ZINC FORMULA 1000-500-50 MG TABS (CALCIUM-MAGNESIUM-ZINC) Take one tablet by mouth once daily. VITAMIN E 400 UNIT CAPS (VITAMIN E) once daily TALWIN 30 MG/ML SOLN (PENTAZOCINE LACTATE) as needed for pain XIFAXAN 550 MG TABS (RIFAXIMIN) Take 1 tablet by mouth two times a day ALLOPURINOL 300 MG TABS (ALLOPURINOL) Take 1/2 tablet once daily PREDNISONE 5 MG TABS (PREDNISONE) take one by mouth once daily VITAMIN D3 1000 UNIT TABS (CHOLECALCIFEROL) two tablet by mouth once daily POLYETHYLENE GLYCOL 3350  POWD (POLYETHYLENE GLYCOL 3350) 1 1/2 tablespoons in liquid two times a day OXYCODONE HCL 5 MG TABS (OXYCODONE HCL) 1 by mouth every 4 hours as needed PENICILLIN V POTASSIUM 250 MG TABS (PENICILLIN V POTASSIUM) Take 1 tablet by mouth once a day ATHLETES FOOT 1 % CREA (TERBINAFINE HCL) apply two times a day to toes/nails for 1 month NEURONTIN 100 MG CAPS (GABAPENTIN) take 1 three times daily  Current Allergies: ! SULFA ! * DILAUDID ! CODEINE !  MORPHINE ! * SHELLFISH ! DEMEROL ! ZETIA (EZETIMIBE) ! AUGMENTIN MORPHINE SULFATE (MORPHINE SULFATE) Review of Systems       c/o nipple pain. wt up 12#. has been up alot helping his brother. is using tinactin on toenails and has improved. has gotten flu shot.   Vital Signs:  Patient profile:   75 year old male Height:      76 inches (193.04 cm) Weight:      253.25 pounds (115.11 kg) BMI:     30.94 Temp:     97.7 degrees F (36.50 degrees C) oral Pulse rate:   59 / minute BP sitting:   146 / 69  (left arm)  Vitals Entered  By: Starleen Arms CMA (December 24, 2009 9:05 AM) CC: f/u leg infection, still in pain, red and swollen Is Patient Diabetic? Yes Did you bring your meter with you today? No Pain Assessment Patient in pain? yes     Location: rt leg Intensity: 3 Type: aching Nutritional Status BMI of > 30 = obese  Does patient need assistance? Functional Status Self care Ambulation Normal   Physical Exam  General:  well-developed, well-nourished, and well-hydrated.   Extremities:  trace left pedal edema and right pretibial edema. mild increase in heat on R. mild erythema. non-tender.     Impression & Recommendations:  Problem # 1:  VENOUS INSUFFICIENCY, CHRONIC (ICD-459.81)  He and his wife are concerned about his leg. I have encouraged him to keep his leg elavated and to try to loose the fluid/weight he has gained. He may need improved diuresis. Will keep him on the PEN VK prophylactic indefinitely and on the antifungal nail treatment. Will return to ID clinic as needed  Orders: Est. Patient Level III ([address])  Medications Added to Medication List This Visit: 1)  Neurontin 100 Mg Caps (Gabapentin) .... Take 1 three times daily

## 2010-03-25 NOTE — Assessment & Plan Note (Signed)
Summary: recurring cellulitis--will see Inf Disease on 1/25 --this is ...   Vital Signs:  Patient profile:   75 year old male Weight:      254.8 pounds BMI:     31.13 Temp:     98.3 degrees F oral Pulse rate:   64 / minute Resp:     17 per minute BP sitting:   134 / 68  (left arm) Cuff size:   large  Vitals Entered By: Shonna Chock CMA (March 18, 2010 1:28 PM) CC: Reoccuring Cellulitis-F/U per Dr.Katz   Primary Care Provider:  Marga Melnick, MD   CC:  Reoccuring Cellulitis-F/U per Dr.Katz.  History of Present Illness:    He has concerned about recurring cellulitis; Dr Ninetta Lights has Rxed PCN 250 mg once daily since PICC removed. Dr Moshe Cipro OV 01/25 & labs 01/24 reviewed & discussed. WBC WNL , fasting  glucose 225. Last A1c was 7.8 % in 12/2009 by Dr Everardo All.  Current Medications (verified): 1)  Metoprolol Tartrate 25 Mg  Tabs (Metoprolol Tartrate) .... 1/2 By Mouth Two Times A Day 2)  Isosorbide Mononitrate Cr 60 Mg  Tb24 (Isosorbide Mononitrate) .Marland Kitchen.. 1 By Mouth Once Daily 3)  Diltiazem Hcl Er Beads 180 Mg  Cp24 (Diltiazem Hcl Er Beads) .Marland Kitchen.. 1 By Mouth Qd 4)  Furosemide 40 Mg  Tabs (Furosemide) .Marland Kitchen.. 1 By Mouth Qd 5)  Omeprazole 20 Mg  Tbec (Omeprazole) .... Take 1 Tablet By Mouth Once A Day 6)  Lantus 100 Unit/ml  Soln (Insulin Glargine) .... 20  Units At Bedtime. 7)  Vitamin C 500 Mg Tabs (Ascorbic Acid) .Marland Kitchen.. 1 By Mouth Once Daily 8)  Amitriptyline Hcl 10 Mg Tabs (Amitriptyline Hcl) .Marland Kitchen.. 1 Tablet At Bedtime 9)  B-1 250 Mg Tabs (Thiamine Hcl) .... 1/2 By Mouth Once Daily 10)  Lactulose 10 Gm/58ml Soln (Lactulose) .... Take 1 and 1/2 Tablespoons Two Times A Day 11)  Freestyle Lancets  Misc (Lancets) .... Test 2-3 Times A Day  Dx 250.02 12)  Freestyle Lite Test  Strp (Glucose Blood) .... Test 2-3 Times Daily Dx 250.02 13)  Spironolactone 50 Mg Tabs (Spironolactone) .... Take 1 Tablet By Mouth Once A Day 14)  Ferrous Sulfate 325 (65 Fe) Mg  Tabs (Ferrous Sulfate) .Marland Kitchen.. 1 By Mouth  Bid 15)  Humalog 100 Unit/ml Soln (Insulin Lispro (Human)) .... 42 Units Three Times A Day (Just Before Each Meal) 16)  Insulin Syringe 31g X 5/16" 0.5 Ml Misc (Insulin Syringe-Needle U-100) .... Qid 17)  Calcium/magnesium/zinc Formula 1000-500-50 Mg Tabs (Calcium-Magnesium-Zinc) .... Take One Tablet By Mouth Once Daily. 18)  Vitamin E 400 Unit Caps (Vitamin E) .... Once Daily 19)  Talwin 30 Mg/ml Soln (Pentazocine Lactate) .... As Needed For Pain 20)  Xifaxan 550 Mg Tabs (Rifaximin) .... Take 1 Tablet By Mouth Two Times A Day 21)  Allopurinol 300 Mg Tabs (Allopurinol) .... Take 1/2 Tablet Once Daily 22)  Prednisone 5 Mg Tabs (Prednisone) .... Take One By Mouth Once Daily 23)  Vitamin D3 1000 Unit Tabs (Cholecalciferol) .... Two Tablet By Mouth Once Daily 24)  Polyethylene Glycol 3350  Powd (Polyethylene Glycol 3350) .Marland Kitchen.. 1 1/2 Tablespoons in Liquid Two Times A Day 25)  Penicillin V Potassium 250 Mg Tabs (Penicillin V Potassium) .... Take 1 Tablet By Mouth Once A Day 26)  Athletes Foot 1 % Crea (Terbinafine Hcl) .... Apply Two Times A Day To Toes/nails For 1 Month 27)  Neurontin 100 Mg Caps (Gabapentin) .... Take 2 Three Times  Daily 28)  Vitamin B-6 100 Mg Tabs (Pyridoxine Hcl) .... One Tablet By Mouth Once Daily  Allergies: 1)  ! Sulfa 2)  ! * Dilaudid 3)  ! Codeine 4)  ! Morphine 5)  ! * Shellfish 6)  ! Demerol 7)  ! Zetia (Ezetimibe) 8)  ! Augmentin 9)  ! Asa 10)  Morphine Sulfate (Morphine Sulfate)  Review of Systems General:  Denies chills, fever, and sweats. Derm:  No purulence.  Physical Exam  General:  in no acute distress; alert,appropriate and cooperative throughout examination Extremities:  Elephantiasis RLE ; marked fungal toe nail chnages  Skin:  Faint erythema R shin w/o increased temp Cervical Nodes:  No lymphadenopathy noted Axillary Nodes:  No palpable lymphadenopathy   Impression & Recommendations:  Problem # 1:  EDEMA (ICD-782.3)  His updated medication  list for this problem includes:    Furosemide 40 Mg Tabs (Furosemide) .Marland Kitchen... 1 by mouth qd    Spironolactone 50 Mg Tabs (Spironolactone) .Marland Kitchen... Take 1 tablet by mouth once a day  Problem # 2:  CELLULITIS, LEG, RIGHT (ICD-682.6)  recurrent ; uncotrolled DM probably major risk His updated medication list for this problem includes:    Xifaxan 550 Mg Tabs (Rifaximin) .Marland Kitchen... Take 1 tablet by mouth two times a day    Penicillin V Potassium 250 Mg Tabs (Penicillin v potassium) .Marland Kitchen... Take 1 tablet by mouth once a day  Orders: Venipuncture ([address]) T-Serum Protein Electrophoresis ([address]454-09811) T- * Misc. Laboratory test [phone])  Problem # 3:  DIABETES MELLITUS, TYPE II, UNCONTROLLED (ICD-250.02)  His updated medication list for this problem includes:    Lantus 100 Unit/ml Soln (Insulin glargine) .Marland Kitchen... 20  units at bedtime.    Humalog 100 Unit/ml Soln (Insulin lispro (human)) .Marland Kitchen... 42 units three times a day (just before each meal)  Problem # 4:  ANEMIA, IRON DEFICIENCY (ICD-280.9)  His updated medication list for this problem includes:    Ferrous Sulfate 325 (65 Fe) Mg Tabs (Ferrous sulfate) .Marland Kitchen... 1 by mouth bid  Orders: Venipuncture (29562) T-Serum Protein Electrophoresis ([id number]) T- * Misc. Laboratory test [phone])  Complete Medication List: 1)  Metoprolol Tartrate 25 Mg Tabs (Metoprolol tartrate) .... 1/2 by mouth two times a day 2)  Isosorbide Mononitrate Cr 60 Mg Tb24 (Isosorbide mononitrate) .Marland Kitchen.. 1 by mouth once daily 3)  Diltiazem Hcl Er Beads 180 Mg Cp24 (Diltiazem hcl er beads) .Marland Kitchen.. 1 by mouth qd 4)  Furosemide 40 Mg Tabs (Furosemide) .Marland Kitchen.. 1 by mouth qd 5)  Omeprazole 20 Mg Tbec (Omeprazole) .... Take 1 tablet by mouth once a day 6)  Lantus 100 Unit/ml Soln (Insulin glargine) .... 20  units at bedtime. 7)  Vitamin C 500 Mg Tabs (Ascorbic acid) .Marland Kitchen.. 1 by mouth once daily 8)  Amitriptyline Hcl 10 Mg Tabs (Amitriptyline hcl) .Marland Kitchen.. 1 tablet at bedtime 9)  B-1 250 Mg Tabs (Thiamine  hcl) .... 1/2 by mouth once daily 10)  Lactulose 10 Gm/68ml Soln (Lactulose) .... Take 1 and 1/2 tablespoons two times a day 11)  Freestyle Lancets Misc (Lancets) .... Test 2-3 times a day  dx 250.02 12)  Freestyle Lite Test Strp (Glucose blood) .... Test 2-3 times daily dx 250.02 13)  Spironolactone 50 Mg Tabs (Spironolactone) .... Take 1 tablet by mouth once a day 14)  Ferrous Sulfate 325 (65 Fe) Mg Tabs (Ferrous sulfate) .Marland Kitchen.. 1 by mouth bid 15)  Humalog 100 Unit/ml Soln (Insulin lispro (human)) .... 42 units three times a day (just before each  meal) 16)  Insulin Syringe 31g X 5/16" 0.5 Ml Misc (Insulin syringe-needle u-100) .... Qid 17)  Calcium/magnesium/zinc Formula 1000-500-50 Mg Tabs (Calcium-magnesium-zinc) .... Take one tablet by mouth once daily. 18)  Vitamin E 400 Unit Caps (Vitamin e) .... Once daily 19)  Talwin 30 Mg/ml Soln (Pentazocine lactate) .... As needed for pain 20)  Xifaxan 550 Mg Tabs (Rifaximin) .... Take 1 tablet by mouth two times a day 21)  Allopurinol 300 Mg Tabs (Allopurinol) .... Take 1/2 tablet once daily 22)  Prednisone 5 Mg Tabs (Prednisone) .... Take one by mouth once daily 23)  Vitamin D3 1000 Unit Tabs (Cholecalciferol) .... Two tablet by mouth once daily 24)  Polyethylene Glycol 3350 Powd (Polyethylene glycol 3350) .Marland Kitchen.. 1 1/2 tablespoons in liquid two times a day 25)  Penicillin V Potassium 250 Mg Tabs (Penicillin v potassium) .... Take 1 tablet by mouth once a day 26)  Athletes Foot 1 % Crea (Terbinafine hcl) .... Apply two times a day to toes/nails for 1 month 27)  Neurontin 100 Mg Caps (Gabapentin) .... Take 2 three times daily 28)  Vitamin B-6 100 Mg Tabs (Pyridoxine hcl) .... One tablet by mouth once daily  Patient Instructions: 1)  Mix Eucerin with Cort Aid 1:1 & apply two times a day to dry shin. Keep the Podiatry appt.    Orders Added: 1)  Est. Patient Level III [04540] 2)  Venipuncture [98119] 3)  T-Serum Protein Electrophoresis  [[id number]] 4)  T- * Misc. Laboratory test [phone]  Appended Document: recurring cellulitis--will see Inf Disease on 1/25 --this is .Marland KitchenMarland Kitchen

## 2010-03-25 NOTE — Progress Notes (Signed)
Summary: Amy from [facility] has questions  Phone Note Other Incoming   Caller: Amy with [place] Summary of Call: Amy from [place] called and wanted to know if they can have an order for blood cultures, pt aslo has an appt on tuesday, did u want the appt to change due to last antibiotic dose being on wednesday? and if not what day would u want the blood cultures drawn?   Amy c/b [phone] Initial call taken by: Almeta Monas CMA Duncan Dull),  November 06, 2009 9:54 AM  Follow-up for Phone Call        left message on machine .....Marland KitchenMarland KitchenDoristine Devoid CMA  November 09, 2009 8:54 AM   spoke w/ Amy from [facility] appt rescheduled since patient last dose of antibiotic will be on wednesday also they will need orders for blood cx and other labs if they are wanted at this time also spoke w/ patient wife informed that appt rescheduled...Marland KitchenMarland KitchenDoristine Devoid CMA  November 09, 2009 9:25 AM

## 2010-03-25 NOTE — Letter (Signed)
Summary: [facility]   Imported By: Sherian Rein 06/01/2009 13:29:39  _____________________________________________________________________  External Attachment:    Type:   Image     Comment:   External Document

## 2010-03-25 NOTE — Progress Notes (Signed)
Summary: Rx refill req  Phone Note Refill Request Message from:  Patient on October 29, 2009 11:11 AM  Refills Requested: Medication #1:  INSULIN SYRINGE 31G X 5/16" 0.5 ML MISC qid   Dosage confirmed as above?Dosage Confirmed   Supply Requested: 6 months  Method Requested: Electronic Initial call taken by: Margaret Pyle, CMA,  October 29, 2009 11:11 AM    Prescriptions: INSULIN SYRINGE 31G X 5/16" 0.5 ML MISC (INSULIN SYRINGE-NEEDLE U-100) qid  #120 x 5   Entered by:   Margaret Pyle, CMA   Authorized by:   Minus Breeding MD   Signed by:   Margaret Pyle, CMA on 10/29/2009   Method used:   Electronically to        [facility] [address] (retail)       [address], [address]       [place], [place]  [address]       Ph: [phone]       Fax: [phone]   RxID:   [id number]

## 2010-03-25 NOTE — Progress Notes (Signed)
Summary: Lab Order  Phone Note Call from Patient   Caller: Spouse Summary of Call: [name] presents in office with lab order from Dr. Dierdre Forth requesting those labs to be added onto labs being drawn for Dr. Leone Payor.  Per Dr Leone Payor, OK to add CCP, RF and Uric Acid.  Order changed in IDX.  Copy to be faxed to Dr. Dierdre Forth at [phone]. Initial call taken by: Francee Piccolo CMA Duncan Dull),  October 20, 2009 10:32 AM

## 2010-03-29 LAB — CONVERTED CEMR LAB
Albumin, U: DETECTED %
Alpha 1, Urine: DETECTED % — AB
Alpha 2, Urine: DETECTED % — AB
Beta, Urine: DETECTED % — AB
Free Kappa Lt Chains,Ur: 1.83 mg/dL — ABNORMAL HIGH
Free Lambda Lt Chains,Ur: <0.05 mg/dL — ABNORMAL LOW
Gamma Globulin, Urine: DETECTED % — AB
Time: 24
Total Protein, Urine-Ur/day: 44 mg/(24.h)
Total Protein, Urine: 2.3 mg/dL
Volume, Urine: 1900 mL

## 2010-03-31 NOTE — Letter (Signed)
Summary: [facility]   Imported By: Lanelle Bal 03/22/2010 10:36:14  _____________________________________________________________________  External Attachment:    Type:   Image     Comment:   External Document

## 2010-04-07 ENCOUNTER — Telehealth: Payer: Self-pay | Admitting: Endocrinology

## 2010-04-14 NOTE — Progress Notes (Signed)
Summary: change insulin?  Phone Note Call from Patient Call back at [facility] Phone [phone]   Caller: Patient Call For: [name] Summary of Call: Pt was given samples of Novolog and they seem to have worked well for pt, better than Humalog. Should he fill prescription for Humalog or be given a prescription for Novalog? Initial call taken by: Verdell Face,  April 07, 2010 9:03 AM  Follow-up for Phone Call        either is ok with me Follow-up by: Minus Breeding MD,  April 07, 2010 9:36 AM  Additional Follow-up for Phone Call Additional follow up Details #1::        pt would like rx for Novolog to go to [facility] in [place] [address]okay to change rx? Additional Follow-up by: Brenton Grills CMA Duncan Dull),  April 07, 2010 11:37 AM    Additional Follow-up for Phone Call Additional follow up Details #2::    rx sent Follow-up by: Minus Breeding MD,  April 07, 2010 12:09 PM  Additional Follow-up for Phone Call Additional follow up Details #3:: Details for Additional Follow-up Action Taken: pt's spouse informed Additional Follow-up by: Brenton Grills CMA Duncan Dull),  April 07, 2010 12:12 PM  New/Updated Medications: NOVOLOG 100 UNIT/ML SOLN (INSULIN ASPART) 42 units three times a day (just before each meal) Prescriptions: NOVOLOG 100 UNIT/ML SOLN (INSULIN ASPART) 42 units three times a day (just before each meal)  #4 vials x 11   Entered and Authorized by:   Minus Breeding MD   Signed by:   Minus Breeding MD on 04/07/2010   Method used:   Electronically to        [facility] [address] (retail)       [address], [address]       [place], [place]  [address]       Ph: [phone]       Fax: [phone]   RxID:   [id number]

## 2010-04-17 ENCOUNTER — Inpatient Hospital Stay (HOSPITAL_COMMUNITY)
Admission: EM | Admit: 2010-04-17 | Discharge: 2010-04-20 | DRG: 441 | Disposition: A | Discharge status: Home / Self Care | Payer: Medicare Other | Attending: Internal Medicine | Admitting: Internal Medicine

## 2010-04-17 ENCOUNTER — Emergency Department (HOSPITAL_COMMUNITY): Payer: Medicare Other

## 2010-04-17 DIAGNOSIS — Z951 Presence of aortocoronary bypass graft: Secondary | ICD-10-CM

## 2010-04-17 DIAGNOSIS — N289 Disorder of kidney and ureter, unspecified: Secondary | ICD-10-CM | POA: Diagnosis not present

## 2010-04-17 DIAGNOSIS — D696 Thrombocytopenia, unspecified: Secondary | ICD-10-CM | POA: Diagnosis present

## 2010-04-17 DIAGNOSIS — K219 Gastro-esophageal reflux disease without esophagitis: Secondary | ICD-10-CM | POA: Diagnosis present

## 2010-04-17 DIAGNOSIS — I8510 Secondary esophageal varices without bleeding: Secondary | ICD-10-CM | POA: Diagnosis present

## 2010-04-17 DIAGNOSIS — I2510 Atherosclerotic heart disease of native coronary artery without angina pectoris: Secondary | ICD-10-CM | POA: Diagnosis present

## 2010-04-17 DIAGNOSIS — I5032 Chronic diastolic (congestive) heart failure: Secondary | ICD-10-CM | POA: Diagnosis present

## 2010-04-17 DIAGNOSIS — K766 Portal hypertension: Secondary | ICD-10-CM | POA: Diagnosis present

## 2010-04-17 DIAGNOSIS — L0291 Cutaneous abscess, unspecified: Secondary | ICD-10-CM | POA: Diagnosis present

## 2010-04-17 DIAGNOSIS — E785 Hyperlipidemia, unspecified: Secondary | ICD-10-CM | POA: Diagnosis present

## 2010-04-17 DIAGNOSIS — Z860100 Personal history of colon polyps, unspecified: Secondary | ICD-10-CM

## 2010-04-17 DIAGNOSIS — G4733 Obstructive sleep apnea (adult) (pediatric): Secondary | ICD-10-CM | POA: Diagnosis present

## 2010-04-17 DIAGNOSIS — R0902 Hypoxemia: Secondary | ICD-10-CM | POA: Diagnosis present

## 2010-04-17 DIAGNOSIS — D649 Anemia, unspecified: Secondary | ICD-10-CM | POA: Diagnosis present

## 2010-04-17 DIAGNOSIS — Z85820 Personal history of malignant melanoma of skin: Secondary | ICD-10-CM

## 2010-04-17 DIAGNOSIS — J189 Pneumonia, unspecified organism: Secondary | ICD-10-CM | POA: Diagnosis present

## 2010-04-17 DIAGNOSIS — K7682 Hepatic encephalopathy: Principal | ICD-10-CM | POA: Diagnosis present

## 2010-04-17 DIAGNOSIS — IMO0002 Reserved for concepts with insufficient information to code with codable children: Secondary | ICD-10-CM

## 2010-04-17 DIAGNOSIS — Z8673 Personal history of transient ischemic attack (TIA), and cerebral infarction without residual deficits: Secondary | ICD-10-CM

## 2010-04-17 DIAGNOSIS — K7030 Alcoholic cirrhosis of liver without ascites: Secondary | ICD-10-CM | POA: Diagnosis present

## 2010-04-17 DIAGNOSIS — I872 Venous insufficiency (chronic) (peripheral): Secondary | ICD-10-CM | POA: Diagnosis present

## 2010-04-17 DIAGNOSIS — M25429 Effusion, unspecified elbow: Secondary | ICD-10-CM | POA: Diagnosis present

## 2010-04-17 DIAGNOSIS — I4891 Unspecified atrial fibrillation: Secondary | ICD-10-CM | POA: Diagnosis present

## 2010-04-17 DIAGNOSIS — M1990 Unspecified osteoarthritis, unspecified site: Secondary | ICD-10-CM | POA: Diagnosis present

## 2010-04-17 DIAGNOSIS — J309 Allergic rhinitis, unspecified: Secondary | ICD-10-CM | POA: Diagnosis present

## 2010-04-17 DIAGNOSIS — T507X5A Adverse effect of analeptics and opioid receptor antagonists, initial encounter: Secondary | ICD-10-CM | POA: Diagnosis present

## 2010-04-17 DIAGNOSIS — K5730 Diverticulosis of large intestine without perforation or abscess without bleeding: Secondary | ICD-10-CM | POA: Diagnosis present

## 2010-04-17 DIAGNOSIS — I509 Heart failure, unspecified: Secondary | ICD-10-CM | POA: Diagnosis present

## 2010-04-17 DIAGNOSIS — K7290 Hepatic failure, unspecified without coma: Principal | ICD-10-CM | POA: Diagnosis present

## 2010-04-17 DIAGNOSIS — I959 Hypotension, unspecified: Secondary | ICD-10-CM | POA: Diagnosis present

## 2010-04-17 DIAGNOSIS — Z794 Long term (current) use of insulin: Secondary | ICD-10-CM

## 2010-04-17 DIAGNOSIS — E119 Type 2 diabetes mellitus without complications: Secondary | ICD-10-CM | POA: Diagnosis present

## 2010-04-17 DIAGNOSIS — Z66 Do not resuscitate: Secondary | ICD-10-CM | POA: Diagnosis present

## 2010-04-17 DIAGNOSIS — M109 Gout, unspecified: Secondary | ICD-10-CM | POA: Diagnosis present

## 2010-04-17 DIAGNOSIS — A419 Sepsis, unspecified organism: Secondary | ICD-10-CM | POA: Diagnosis present

## 2010-04-17 LAB — DIFFERENTIAL
Eosinophils Absolute: 0 10*3/uL (ref 0.0–0.7)
Eosinophils Relative: 0 % (ref 0–5)
Lymphs Abs: 0.8 10*3/uL (ref 0.7–4.0)
Monocytes Relative: 11 % (ref 3–12)

## 2010-04-17 LAB — COMPREHENSIVE METABOLIC PANEL
Albumin: 3.6 g/dL (ref 3.5–5.2)
Alkaline Phosphatase: 56 U/L (ref 39–117)
BUN: 29 mg/dL — ABNORMAL HIGH (ref 6–23)
CO2: 27 mEq/L (ref 19–32)
Chloride: 99 mEq/L (ref 96–112)
Creatinine, Ser: 1.52 mg/dL — ABNORMAL HIGH (ref 0.4–1.5)
GFR calc non Af Amer: 45 mL/min — ABNORMAL LOW (ref >60)
Glucose, Bld: 281 mg/dL — ABNORMAL HIGH (ref 70–99)
Potassium: 4.8 mEq/L (ref 3.5–5.1)
Total Bilirubin: 1.3 mg/dL — ABNORMAL HIGH (ref 0.3–1.2)

## 2010-04-17 LAB — CBC
HCT: 36.7 % — ABNORMAL LOW (ref 39.0–52.0)
MCHC: 33.2 g/dL (ref 30.0–36.0)
MCV: 97.9 fL (ref 78.0–100.0)
Platelets: 60 10*3/uL — ABNORMAL LOW (ref 150–400)
RDW: 14.7 % (ref 11.5–15.5)
WBC: 8.1 10*3/uL (ref 4.0–10.5)

## 2010-04-17 LAB — BRAIN NATRIURETIC PEPTIDE: Pro B Natriuretic peptide (BNP): 680 pg/mL — ABNORMAL HIGH (ref 0.0–100.0)

## 2010-04-17 LAB — LACTIC ACID, PLASMA: Lactic Acid, Venous: 2.9 mmol/L — ABNORMAL HIGH (ref 0.5–2.2)

## 2010-04-17 LAB — URINALYSIS, ROUTINE W REFLEX MICROSCOPIC
Bilirubin Urine: NEGATIVE
Hgb urine dipstick: NEGATIVE
Ketones, ur: NEGATIVE mg/dL
Nitrite: NEGATIVE
Protein, ur: NEGATIVE mg/dL
Urobilinogen, UA: 0.2 mg/dL (ref 0.0–1.0)

## 2010-04-17 LAB — PROCALCITONIN: Procalcitonin: <0.1 ng/mL

## 2010-04-18 ENCOUNTER — Inpatient Hospital Stay (HOSPITAL_COMMUNITY): Payer: Medicare Other

## 2010-04-18 LAB — URINE CULTURE
Culture  Setup Time: 201202252037
Culture: NO GROWTH

## 2010-04-18 LAB — GLUCOSE, CAPILLARY: Glucose-Capillary: 143 mg/dL — ABNORMAL HIGH (ref 70–99)

## 2010-04-18 LAB — HEMOGLOBIN A1C
Hgb A1c MFr Bld: 7.1 % — ABNORMAL HIGH (ref <5.7)
Mean Plasma Glucose: 157 mg/dL — ABNORMAL HIGH (ref <117)

## 2010-04-19 LAB — GLUCOSE, CAPILLARY
Glucose-Capillary: 206 mg/dL — ABNORMAL HIGH (ref 70–99)
Glucose-Capillary: 238 mg/dL — ABNORMAL HIGH (ref 70–99)

## 2010-04-19 LAB — BASIC METABOLIC PANEL
Chloride: 101 mEq/L (ref 96–112)
GFR calc Af Amer: >60 mL/min (ref >60)
Potassium: 4.1 mEq/L (ref 3.5–5.1)

## 2010-04-19 LAB — CBC
Hemoglobin: 11.8 g/dL — ABNORMAL LOW (ref 13.0–17.0)
MCV: 97 fL (ref 78.0–100.0)
Platelets: 47 10*3/uL — ABNORMAL LOW (ref 150–400)
RBC: 3.62 MIL/uL — ABNORMAL LOW (ref 4.22–5.81)
WBC: 6.8 10*3/uL (ref 4.0–10.5)

## 2010-04-19 LAB — MAGNESIUM: Magnesium: 1.9 mg/dL (ref 1.5–2.5)

## 2010-04-19 LAB — AMMONIA: Ammonia: 67 umol/L — ABNORMAL HIGH (ref 11–35)

## 2010-04-20 ENCOUNTER — Encounter (INDEPENDENT_AMBULATORY_CARE_PROVIDER_SITE_OTHER): Payer: Self-pay | Admitting: *Deleted

## 2010-04-20 ENCOUNTER — Telehealth: Payer: Self-pay | Admitting: Endocrinology

## 2010-04-20 LAB — AMMONIA: Ammonia: 48 umol/L — ABNORMAL HIGH (ref 11–35)

## 2010-04-20 LAB — GLUCOSE, CAPILLARY: Glucose-Capillary: 175 mg/dL — ABNORMAL HIGH (ref 70–99)

## 2010-04-23 LAB — CULTURE, BLOOD (ROUTINE X 2)
Culture  Setup Time: 201202252022
Culture: NO GROWTH

## 2010-04-24 ENCOUNTER — Emergency Department (HOSPITAL_COMMUNITY)
Admission: EM | Admit: 2010-04-24 | Discharge: 2010-04-24 | Disposition: A | Discharge status: Home / Self Care | Payer: Medicare Other | Attending: Internal Medicine | Admitting: Internal Medicine

## 2010-04-24 ENCOUNTER — Emergency Department (HOSPITAL_COMMUNITY): Payer: Medicare Other

## 2010-04-24 DIAGNOSIS — N390 Urinary tract infection, site not specified: Secondary | ICD-10-CM | POA: Insufficient documentation

## 2010-04-24 DIAGNOSIS — R404 Transient alteration of awareness: Secondary | ICD-10-CM | POA: Insufficient documentation

## 2010-04-24 DIAGNOSIS — R5381 Other malaise: Secondary | ICD-10-CM | POA: Insufficient documentation

## 2010-04-24 DIAGNOSIS — Z794 Long term (current) use of insulin: Secondary | ICD-10-CM | POA: Insufficient documentation

## 2010-04-24 DIAGNOSIS — R0989 Other specified symptoms and signs involving the circulatory and respiratory systems: Secondary | ICD-10-CM | POA: Insufficient documentation

## 2010-04-24 DIAGNOSIS — R110 Nausea: Secondary | ICD-10-CM | POA: Insufficient documentation

## 2010-04-24 DIAGNOSIS — M7989 Other specified soft tissue disorders: Secondary | ICD-10-CM | POA: Insufficient documentation

## 2010-04-24 DIAGNOSIS — I10 Essential (primary) hypertension: Secondary | ICD-10-CM | POA: Insufficient documentation

## 2010-04-24 DIAGNOSIS — I509 Heart failure, unspecified: Secondary | ICD-10-CM | POA: Insufficient documentation

## 2010-04-24 DIAGNOSIS — R509 Fever, unspecified: Secondary | ICD-10-CM | POA: Insufficient documentation

## 2010-04-24 DIAGNOSIS — R350 Frequency of micturition: Secondary | ICD-10-CM | POA: Insufficient documentation

## 2010-04-24 DIAGNOSIS — I2510 Atherosclerotic heart disease of native coronary artery without angina pectoris: Secondary | ICD-10-CM | POA: Insufficient documentation

## 2010-04-24 DIAGNOSIS — I440 Atrioventricular block, first degree: Secondary | ICD-10-CM | POA: Insufficient documentation

## 2010-04-24 DIAGNOSIS — E785 Hyperlipidemia, unspecified: Secondary | ICD-10-CM | POA: Insufficient documentation

## 2010-04-24 DIAGNOSIS — R5383 Other fatigue: Secondary | ICD-10-CM | POA: Insufficient documentation

## 2010-04-24 DIAGNOSIS — R0609 Other forms of dyspnea: Secondary | ICD-10-CM | POA: Insufficient documentation

## 2010-04-24 DIAGNOSIS — E119 Type 2 diabetes mellitus without complications: Secondary | ICD-10-CM | POA: Insufficient documentation

## 2010-04-24 LAB — PROTIME-INR: Prothrombin Time: 17.4 seconds — ABNORMAL HIGH (ref 11.6–15.2)

## 2010-04-24 LAB — DIFFERENTIAL
Basophils Relative: 0 % (ref 0–1)
Eosinophils Absolute: 0 10*3/uL (ref 0.0–0.7)
Lymphs Abs: 1.1 10*3/uL (ref 0.7–4.0)
Neutrophils Relative %: 82 % — ABNORMAL HIGH (ref 43–77)

## 2010-04-24 LAB — CBC
MCV: 94.7 fL (ref 78.0–100.0)
Platelets: 43 10*3/uL — ABNORMAL LOW (ref 150–400)
RBC: 3.21 MIL/uL — ABNORMAL LOW (ref 4.22–5.81)
WBC: 10.4 10*3/uL (ref 4.0–10.5)

## 2010-04-24 LAB — URINALYSIS, ROUTINE W REFLEX MICROSCOPIC
Bilirubin Urine: NEGATIVE
Glucose, UA: 250 mg/dL — AB
Ketones, ur: NEGATIVE mg/dL
Protein, ur: NEGATIVE mg/dL

## 2010-04-24 LAB — AMMONIA: Ammonia: 16 umol/L (ref 11–35)

## 2010-04-24 LAB — COMPREHENSIVE METABOLIC PANEL
AST: 25 U/L (ref 0–37)
Albumin: 3.1 g/dL — ABNORMAL LOW (ref 3.5–5.2)
Calcium: 8.5 mg/dL (ref 8.4–10.5)
Creatinine, Ser: 1.21 mg/dL (ref 0.4–1.5)
GFR calc Af Amer: >60 mL/min (ref >60)
Total Protein: 5.6 g/dL — ABNORMAL LOW (ref 6.0–8.3)

## 2010-04-24 LAB — URINE MICROSCOPIC-ADD ON

## 2010-04-26 ENCOUNTER — Encounter: Payer: Self-pay | Admitting: Licensed Clinical Social Worker

## 2010-04-26 ENCOUNTER — Telehealth (INDEPENDENT_AMBULATORY_CARE_PROVIDER_SITE_OTHER): Payer: Self-pay | Admitting: *Deleted

## 2010-04-27 ENCOUNTER — Encounter: Payer: Self-pay | Admitting: Endocrinology

## 2010-04-27 ENCOUNTER — Ambulatory Visit (INDEPENDENT_AMBULATORY_CARE_PROVIDER_SITE_OTHER): Payer: Medicare Other | Admitting: Endocrinology

## 2010-04-27 DIAGNOSIS — R609 Edema, unspecified: Secondary | ICD-10-CM

## 2010-04-27 DIAGNOSIS — L97509 Non-pressure chronic ulcer of other part of unspecified foot with unspecified severity: Secondary | ICD-10-CM | POA: Insufficient documentation

## 2010-04-27 DIAGNOSIS — IMO0001 Reserved for inherently not codable concepts without codable children: Secondary | ICD-10-CM

## 2010-04-29 ENCOUNTER — Encounter: Payer: Self-pay | Admitting: Internal Medicine

## 2010-04-29 ENCOUNTER — Ambulatory Visit (INDEPENDENT_AMBULATORY_CARE_PROVIDER_SITE_OTHER): Payer: Medicare Other | Admitting: Internal Medicine

## 2010-04-29 DIAGNOSIS — L97509 Non-pressure chronic ulcer of other part of unspecified foot with unspecified severity: Secondary | ICD-10-CM

## 2010-04-29 DIAGNOSIS — N390 Urinary tract infection, site not specified: Secondary | ICD-10-CM

## 2010-04-29 DIAGNOSIS — R609 Edema, unspecified: Secondary | ICD-10-CM

## 2010-04-29 DIAGNOSIS — G589 Mononeuropathy, unspecified: Secondary | ICD-10-CM

## 2010-04-29 DIAGNOSIS — G4730 Sleep apnea, unspecified: Secondary | ICD-10-CM

## 2010-04-29 NOTE — H&P (Signed)
[name], [name]                   ACCOUNT NO.:  [id number]  MEDICAL RECORD NO.:  [id number]           PATIENT TYPE:  E  LOCATION:  [facility]                         FACILITY:  [facility]  PHYSICIAN:  Calvert Cantor, M.D.     DATE OF BIRTH:  1935/04/23  DATE OF ADMISSION:  04/17/2010 DATE OF DISCHARGE:                             HISTORY & PHYSICAL   PRIMARY CARE PHYSICIAN:  Titus Dubin. Alwyn Ren, MD,FACP,FCCP  CARDIOLOGIST:  Luis Abed, MD, [facility]  PRESENTING COMPLAINT:  Decreased level of consciousness.  HISTORY OF PRESENT ILLNESS:  This is a 75 year old male with an extensive medical history who was found lethargic and minimally verbal by his wife this morning.  Most of the history is obtained from his wife.  She states that yesterday afternoon, he was given a new medication which had been prescribed by a pain management physician by the name of Tonye Royalty, MD.  This was Suboxone at a dose of 8- 2.  She states she only gave him half of the film.  Subsequent to this, the patient became quite sleepy.  She had to wake him up to feed him lunch and he slept and snored through the night and this morning she had trouble waking him.  EMS was called and EMS noted that the patient had some periods of apnea en route.  He does have sleep apnea.  He was given 0.8 mg dose of Narcan without any immediate effect.  Upon being brought to the ER, he was noted to have a right-sided gaze.  According to the nurse, his arms were flaccid.  He was not able to follow commands.  He was also noted to have a temperature of 100.2 rectally.  He eventually did start to come around and now is awake and alert and able to talk to me, but is unsure what happened.  He does fall asleep quite easily.  He no longer has a right-sided gaze.  His wife also states that he was incontinent of urine while asleep last night.  The patient has no complaints other than a dry mouth.  Right now, he has not had a cough.  He  has not been more short of breath than usual.  He does have a history of right lower extremity cellulitis, but on exam, it is slightly pink and wife states that this is better than usual.  The patient has been given 500 cc of normal saline in the ER.  PAST MEDICAL HISTORY: 1. Alcohol induced cirrhosis, status post TIPS procedure. 2. Multiple episodes of right lower extremity cellulitis, now on daily     penicillin as prescribed by Dr. Ninetta Lights. 3. Portal hypertension with esophageal varices. 4. Diastolic dysfunction, although this is not quantified on the last     echo.  His EF was 65%. 5. Paroxysmal atrial fibrillation, currently he is in sinus rhythm. 6. Cerebrovascular accident in the past. 7. Coronary artery disease, status post CABG x6 in 1992. 8. Type 2 diabetes mellitus. 9. Dyslipidemia. 10.Gout. 11.GI bleed in February 2010. 12.Thrombocytopenia, likely from portal hypertension. 13.Anemia. 14.Internal hemorrhoids. 15.Adenomatous colon polyps. 16.Allergic  rhinitis. 17.Gastroesophageal reflux disease and a suspicion of Barrett's     esophagus. 18.Osteoarthritis. 19.Rhinophyma. 20.Melanoma resection in 2008. 21.Gastric antral vascular ectasia. 22.Diverticulosis. 23.Obstructive sleep apnea, currently is unable to use his machine due     to the mask irritating his nose.  Wife has ordered a new mask. 24.Venous insufficiency. 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  PAST SURGICAL HISTORY: 1. Multiple back surgeries. 2. TIPS procedure. 3. Hiatal hernia repair in 1966. 4. Hemorrhoidectomy. 5. Inguinal herniorrhaphy. 6. Contractures of bilateral hands repaired. 7. Right arm surgery.  MEDICATIONS:  According to the list brought in by his wife: 1. Suboxone 8-2 which he took half of yesterday for the first time. 2. Vitamin C 500 mg daily. 3. Allopurinol 300 mg half tablet daily. 4. Furosemide 40 mg 1 tablet daily. 5. Spironolactone  50 mg 1 tablet daily. 6. Amitriptyline 10 mg at bedtime. 7. Vitamin D3 2000 units 2 tablets daily. 8. Feosol 2 times a day. 9. Diltiazem 180 mg 1 capsule daily. 10.Metoprolol 25 mg half tab twice a day. 11.Isosorbide mononitrate 60 mg daily. 12.Omeprazole 20 mg daily. 13.Lantus 20 units at bedtime. 14.Thiamine 250 mg half tab daily. 15.Lactulose 1-1/2 tablespoons 2 times a day. 16.Polyethylene glycol once a day. 17.Rifaximin 550 mg twice a day. 18.Calcium, magnesium, and zinc supplement 1 daily. 19.NovoLog insulin 42 units before meals. 20.Vitamin E 400 units daily. 21.Prednisone 5 mg tab, half tab daily. 22.Penicillin 250 mg daily. 23.Gabapentin 100 mg 2 capsules twice a day.  ALLERGIES:  SULFA causes nausea, SHELLFISH causes swelling of his tongue.  DEMEROL causes blood pressure to drop.  MORPHINE caused visual hallucinations.  CODEINE is mentioned in his allergies, but wife is not sure of the reaction.  OXYCONTIN is also mentioned, but apparently he is able to take oxycodone without any problem.  DILAUDID is also mentioned and wife states that this was given to him after surgery and resulted in respiratory suppression and hypotension, which was probably not an allergy but a side effect.  FAMILY HISTORY:  Heart disease, cancer, diabetes, hypertension.  SOCIAL HISTORY:  He was a former smoker and former drinker, currently lives with his spouse who takes care of him.  No recent drug abuse.  REVIEW OF SYSTEMS:  Not obtainable due to patient's sleepiness.  PHYSICAL EXAM:  GENERAL:  Elderly male lying in bed, snoring with his mouth open but easily wakeable. VITAL SIGNS:  Blood pressure 129/67, pulse 72, respiratory rate 22, temperature 100.2, oxygen saturation was 95% on room air and 100% on 2 L. HEENT:  Pupils are equal, round, reactive to light.  Extraocular movements are intact.  Conjunctivae is pink.  No scleral icterus.  Oral mucosa is extremely dry with dry lips as well.   Oropharynx is clear. NECK:  Supple.  No thyromegaly or lymphadenopathy. HEART:  Regular rate and rhythm.  No murmurs, rubs, or gallops. LUNGS:  Crackles in the left lower lobe.  Normal respiratory effort.  No use of accessory muscles. ABDOMEN:  Obese, soft, nontender, and nondistended.  Bowel sounds positive. EXTREMITIES:  No cyanosis or clubbing.  He does have edema bilaterally. Pedal pulses are not able to be palpated due to edema.  NEUROLOGIC: Cranial nerves are grossly intact.  He is able to move all 4 extremities on command. PSYCHOLOGIC:  Awake and alert, but falls asleep easily.  Oriented to time, place, and person. SKIN:  Warm, dry.  Numerous bruises.  He has a  pale pink rash on his right shin, which the wife states is much better than usual.  He has rhinophyma and hyperkeratotic skin on his legs.  LABORATORY DATA:  Blood work, metabolic panel reveals a BUN of 29 and creatinine of 1.52, lactic acid was mildly elevated at 2.9. Procalcitonin is less than 0.10.  WBC count is 8.1; hemoglobin 12.2, hematocrit 36.7, and platelets are 60.  BNP is 680.  UA is negative for infection.  INR is 1.3.  Chest x-ray reveals increased asymmetric opacification of the left lung noted suspicious for either posterior layering effusion or asymmetric edema.  CT of the head reveals small vessel ischemic changes and brain atrophy.  EKG reveals a sinus rhythm at 74 beats per minute with left axis deviation.  ASSESSMENT AND PLAN: 1. Altered level of consciousness.  I suspect this was secondary to     the Suboxone.  Therefore, I will hold this.  He is not on any other     narcotics.  I will go ahead and check an ammonia level as well. 2. Fever with a left lower lobe infiltrate, I am more suspicious that     this may be an infection rather than heart failure especially as it     is unilateral.  He may have aspirated although this is left-sided     infiltrate.  For now, we will start him on Zosyn  and Zithromax.  We     will hold his penicillin while he is on this medication.  We will     continue to follow temperatures closely.  Blood cultures have been     ordered. 3. Right-sided gaze, I suspect this was secondary to hypoxia or     hypotension resulting in poor brain perfusion.  At this point, I     will not order any further workup for this.  Anyhow, he is not     really a candidate for anticoagulation.  We will order neuro checks     every 4 hours for 4 more times. 4. Cirrhosis.  We will continue his diuretics. 5. Obstructive sleep apnea, has not been using his mask as it     irritates his nose.  His wife has ordered another one for him.  We     will see if we can find one for him while he is in the hospital     that suits him. 6. Diabetes mellitus.  We will get Accu-Cheks a.c. and h.s. and hold     off on placing him on a sliding scale but resume his home dose of     insulin.  The patient is a do not intubate.  He does want CPR.  DVT prophylaxis with SCDs stockings.  Time on admission was 65 minutes.     Calvert Cantor, M.D.     SR/MEDQ  D:  04/17/2010  T:  04/17/2010  Job:  [id number]  cc:   Titus Dubin. Alwyn Ren, MD,FACP,FCCP  Electronically Signed by Calvert Cantor M.D. on 04/28/2010 12:37:05 PM

## 2010-04-29 NOTE — Progress Notes (Signed)
Summary: Insulin change  Phone Note Call from Patient Call back at Home Phone [phone]   Caller: Patient Call For: Dr Everardo All Summary of Call: Pt states message left on triage B earlier regarding insulin change, please call pt today. Initial call taken by: Verdell Face,  April 20, 2010 4:59 PM  Follow-up for Phone Call        Spouse advised that pt was D/Cd form hospital and his Insulin dosage was changed. Spouse advised to schedule post hosp with SAE to discuss changes but to continue taking meds as Rx'd by discharging MD Follow-up by: Margaret Pyle, CMA,  April 21, 2010 8:54 AM

## 2010-04-30 ENCOUNTER — Telehealth (INDEPENDENT_AMBULATORY_CARE_PROVIDER_SITE_OTHER): Payer: Self-pay | Admitting: *Deleted

## 2010-04-30 ENCOUNTER — Telehealth: Payer: Self-pay | Admitting: Internal Medicine

## 2010-05-01 LAB — CULTURE, BLOOD (ROUTINE X 2): Culture: NO GROWTH

## 2010-05-04 ENCOUNTER — Encounter: Payer: Self-pay | Admitting: Internal Medicine

## 2010-05-04 ENCOUNTER — Other Ambulatory Visit: Payer: Medicare Other

## 2010-05-04 ENCOUNTER — Ambulatory Visit (INDEPENDENT_AMBULATORY_CARE_PROVIDER_SITE_OTHER): Payer: Medicare Other | Admitting: Internal Medicine

## 2010-05-04 ENCOUNTER — Other Ambulatory Visit: Payer: Self-pay | Admitting: Internal Medicine

## 2010-05-04 DIAGNOSIS — R188 Other ascites: Secondary | ICD-10-CM

## 2010-05-04 DIAGNOSIS — K7030 Alcoholic cirrhosis of liver without ascites: Secondary | ICD-10-CM

## 2010-05-04 DIAGNOSIS — K7460 Unspecified cirrhosis of liver: Secondary | ICD-10-CM

## 2010-05-04 DIAGNOSIS — R0602 Shortness of breath: Secondary | ICD-10-CM

## 2010-05-04 DIAGNOSIS — R609 Edema, unspecified: Secondary | ICD-10-CM

## 2010-05-04 LAB — BASIC METABOLIC PANEL
BUN: 18 mg/dL (ref 6–23)
Chloride: 105 mEq/L (ref 96–112)
Glucose, Bld: 97 mg/dL (ref 70–99)
Potassium: 3.9 mEq/L (ref 3.5–5.1)

## 2010-05-04 LAB — BRAIN NATRIURETIC PEPTIDE: Pro B Natriuretic peptide (BNP): 399.8 pg/mL — ABNORMAL HIGH (ref 0.0–100.0)

## 2010-05-04 NOTE — Progress Notes (Signed)
Summary: call-a-nurse-check on pt  Phone Note Outgoing Call   Details for Reason: Call-A-Nurse Triage Call Report Triage Record Num: [id number] Operator: Peri Jefferson Patient Name: [name] Call Date & Time: 04/24/2010 3:57:52PM Patient Phone: [phone] PCP: Marga Melnick Patient Gender: Male PCP Fax : [phone] Patient DOB: 05-19-1935 Practice Name: [facility] Reason for Call: [name]/Wife calling because [name] was d/c'd from the hospital on 04/19/10. Was in hospital d/t drug reaction and pneumonia. Has not had fever since 04/19/10 until today. Took last dose of Avelox today. Today, [name] has been sleepy, c/o nausea, and return of fever. Temp 101.2 O. Has been c/o headache since 04/21/10. Advised ED. Protocol(s) Used: Fever - Adult Recommended Outcome per Protocol: Call Provider Immediately Reason for Outcome: Frail elderly, immunocompromised or pregnant person with temperature of 100.5 F (38.0 C) or higher Care Advice:  ~ SYMPTOM / CONDITION MANAGEMENT Systemic Inflammatory Response Syndrome (SIRS): Watch for signs of a generalized, whole body infection. Occurs within days of a localized infection, especially of the urinary, GI, respiratory or nervous systems; or after a traumatic injury or invasive procedure. - Call EMS 911 if symptoms have worsened, such as increasing confusion or unusual drowsiness; cold and clammy skin; no urine output; rapid respiration (>30/min.) or slow respiration (<10/min.); struggling to breathe. - Go to the ED immediately for early symptoms of rapid pulse >90/min. or rapid breathing >20/min. at rest; chills; oral temperature >100.4 F (38 C) or <96.8 F (36 C) when associated with conditions noted.  ~ 04/24/2010 4:06:19PM Page 1 of 1 [id number]  Follow-up for Phone Call        left message on machine .....Marland KitchenMarland KitchenDoristine Devoid CMA  April 26, 2010 8:28 AM   spoke w/ patient's wife says is doing a little better was found to have an UTI  and is currently being treated also scheduled hospital f/u to see Hop this week........Marland KitchenDoristine Devoid CMA  April 26, 2010 9:06 AM

## 2010-05-04 NOTE — Progress Notes (Signed)
Summary: Follow-up phone call  Phone Note Outgoing Call Call back at [facility] Phone [phone]   Call placed by: Shonna Chock CMA,  April 30, 2010 1:08 PM Call placed to: Patient Summary of Call: Left message on voicemail(Home phone) with results: Please tell Mrs. Petrucelli we reviewed culture results . UA was abnormal suggesting UTI but both urine culture & blood cultures were negative ( No growth). If he remains afebrile , Keflex may be D/Ced.  Tried to reach patient on cell, gave patient information, patient ok'd understanding and did not have any questions at the time of call   Shonna Chock CMA  April 30, 2010 1:09 PM

## 2010-05-04 NOTE — Assessment & Plan Note (Signed)
Summary: hosp f/u/cdj   Vital Signs:  Patient profile:   75 year old male Weight:      263 pounds BMI:     32.13 Temp:     98.4 degrees F oral Pulse rate:   72 / minute Resp:     16 per minute BP sitting:   132 / 74  (left arm) Cuff size:   large  Vitals Entered By: Shonna Chock CMA (April 29, 2010 10:29 AM) CC: 1.) Hospital follow-up  2.) Feet swollen: blister's on each heel-painful/itching  2.) Discuss CPAP machine: patient has an older one but would like a new one with tubing and mask sent to [facility], URI symptoms, Dysuria   Primary Care Provider:  Marga Melnick, MD   CC:  1.) Hospital follow-up  2.) Feet swollen: blister's on each heel-painful/itching  2.) Discuss CPAP machine: patient has an older one but would like a new one with tubing and mask sent to [facility], URI symptoms, and Dysuria.  History of Present Illness:    Avelox Rxed for PNA based on auscuttory & radiographic findings ; it was completed 04/22/2010. Keflex was Rxed after bolus of Rocephin in ER 03/03 for UTI. He has had recurrent cellulitis in feet. At this time he  denies purulent nasal discharge, sore throat,  productive cough or  fever.  He also denies burning with urination, hematuria, and penile discharge.  The patient denies the following associated symptoms: shaking chills.  He is having burning in feet; Gabapentin  & pain med ( Suboxone)  Rxed by [facility] caused excess sedation . He has not used CPAP for 10 years but questions restarting it. Possible contribution of Sleep Apnea in addition to documented hepatic isufficiency  to intractable edema discussed.  Allergies: 1)  ! Sulfa 2)  ! * Dilaudid 3)  ! Codeine 4)  ! Morphine 5)  ! * Shellfish 6)  ! Demerol 7)  ! Zetia (Ezetimibe) 8)  ! Augmentin 9)  ! Asa 10)  Morphine Sulfate (Morphine Sulfate)  Physical Exam  General:  in no acute distress; alert,appropriate and cooperative throughout examination Nose:  External nasal examination shows no  deformity or inflammation. Nasal mucosa are  dry without lesions or exudates. Mouth:  Oral mucosa and oropharynx without lesions or exudates.  Slightly hoarse; no pharyngeal erythema.   Lungs:  Normal respiratory effort, chest expands symmetrically. Lungs ; faint  crackles  w/o increased WOB Extremities:  3+ R &  left pedal edema.   Skin:  Ulcers on feet @ medial malleoli  not infected; good eschar  Cervical Nodes:  No lymphadenopathy noted Axillary Nodes:  No palpable lymphadenopathy Psych:  flat affect and subdued.     Impression & Recommendations:  Problem # 1:  UTI (ICD-599.0) ER cultures will be reviewed His updated medication list for this problem includes:    Xifaxan 550 Mg Tabs (Rifaximin) .Marland Kitchen... Take 1 tablet by mouth two times a day    Penicillin V Potassium 250 Mg Tabs (Penicillin v potassium) .Marland Kitchen... Take 1 tablet by mouth once a day    Cephalexin 500 Mg Caps (Cephalexin) .Marland Kitchen... 1 by mouth four times a day x 7 days for uti  Problem # 2:  NEUROPATHY (ICD-355.9)  Problem # 3:  FOOT ULCER (ICD-707.15)  Problem # 4:  EDEMA (ICD-782.3) ? contribution of Sleep Apnea His updated medication list for this problem includes:    Furosemide 40 Mg Tabs (Furosemide) .Marland Kitchen... 1 by mouth qd  Spironolactone 50 Mg Tabs (Spironolactone) .Marland Kitchen... Take 1 tablet by mouth once a day  Complete Medication List: 1)  Metoprolol Tartrate 25 Mg Tabs (Metoprolol tartrate) .... 1/2 by mouth two times a day 2)  Isosorbide Mononitrate Cr 60 Mg Tb24 (Isosorbide mononitrate) .Marland Kitchen.. 1 by mouth once daily 3)  Diltiazem Hcl Er Beads 180 Mg Cp24 (Diltiazem hcl er beads) .Marland Kitchen.. 1 by mouth qd 4)  Furosemide 40 Mg Tabs (Furosemide) .Marland Kitchen.. 1 by mouth qd 5)  Omeprazole 20 Mg Tbec (Omeprazole) .... Take 1 tablet by mouth once a day 6)  Lantus 100 Unit/ml Soln (Insulin glargine) .... 20 units at bedtime. 7)  Vitamin C 500 Mg Tabs (Ascorbic acid) .Marland Kitchen.. 1 by mouth once daily 8)  Amitriptyline Hcl 10 Mg Tabs (Amitriptyline hcl)  .Marland Kitchen.. 1 tablet at bedtime 9)  B-1 250 Mg Tabs (Thiamine hcl) .... 1/2 by mouth once daily 10)  Lactulose 10 Gm/44ml Soln (Lactulose) .... Take 1 and 1/2 tablespoons two times a day 11)  Freestyle Lancets Misc (Lancets) .... Test 2-3 times a day  dx 250.02 12)  Freestyle Lite Test Strp (Glucose blood) .... Test 2-3 times daily dx 250.02 13)  Spironolactone 50 Mg Tabs (Spironolactone) .... Take 1 tablet by mouth once a day 14)  Ferrous Sulfate 325 (65 Fe) Mg Tabs (Ferrous sulfate) .Marland Kitchen.. 1 by mouth bid 15)  Insulin Syringe 31g X 5/16" 0.5 Ml Misc (Insulin syringe-needle u-100) .... Qid 16)  Calcium/magnesium/zinc Formula 1000-500-50 Mg Tabs (Calcium-magnesium-zinc) .... Take one tablet by mouth once daily. 17)  Vitamin E 400 Unit Caps (Vitamin e) .... Once daily 18)  Talwin 30 Mg/ml Soln (Pentazocine lactate) .... As needed for pain 19)  Xifaxan 550 Mg Tabs (Rifaximin) .... Take 1 tablet by mouth two times a day 20)  Allopurinol 300 Mg Tabs (Allopurinol) .... Take 1/2 tablet once daily 21)  Prednisone 5 Mg Tabs (Prednisone) .... Take one by mouth once daily 22)  Vitamin D3 1000 Unit Tabs (Cholecalciferol) .... Two tablet by mouth once daily 23)  Polyethylene Glycol 3350 Powd (Polyethylene glycol 3350) .Marland Kitchen.. 1 1/2 tablespoons in liquid two times a day 24)  Penicillin V Potassium 250 Mg Tabs (Penicillin v potassium) .... Take 1 tablet by mouth once a day 25)  Athletes Foot 1 % Crea (Terbinafine hcl) .... Apply two times a day to toes/nails for 1 month 26)  Neurontin 100 Mg Caps (Gabapentin) .... Take 2 three times daily 27)  Vitamin B-6 100 Mg Tabs (Pyridoxine hcl) .... One tablet by mouth once daily 28)  Novolog 100 Unit/ml Soln (Insulin aspart) .... 42 units three times a day (just before each meal) 29)  Cephalexin 500 Mg Caps (Cephalexin) .Marland Kitchen.. 1 by mouth four times a day x 7 days for uti  Other Orders: Sleep Disorder Referral (Sleep Disorder)  Patient Instructions: 1)  take 100 of Gabapentin  mid way between other 2 doses.   Orders Added: 1)  Est. Patient Level III [04540] 2)  Sleep Disorder Referral [Sleep Disorder]  Appended Document: hosp f/u/cdj Please tell Mrs. Knierim we reviewed culture results . UA was abnormal suggesting UTI but both urine culture & blood cultures were negative ( No growth). If he remains afebrile , Keflex may be D/Ced.  Appended Document: hosp f/u/cdj see phone note

## 2010-05-04 NOTE — Progress Notes (Signed)
Summary: Triage  Phone Note Call from Patient Call back at Home Phone [phone]   Caller: Wife [name] Call For: Dr. Leone Payor Summary of Call: Saw Dr. Alwyn Ren yesterday, has alot of swelling in hands and feet. Was told it maybe coming from his liver Initial call taken by: Karna Christmas,  April 30, 2010 10:59 AM  Follow-up for Phone Call        called wife and added pt to Dr. Leone Payor schedule on 05/04/2010. per Lavonna Rua. Follow-up by: Harlow Mares CMA (AAMA),  April 30, 2010 1:05 PM

## 2010-05-04 NOTE — Assessment & Plan Note (Signed)
Summary: POST HOSP/SCHED FOR 4 MO FU MARCH 23 / [facility]   Vital Signs:  Patient profile:   75 year old male Height:      76 inches (193.04 cm) Weight:      259.75 pounds (118.07 kg) BMI:     31.73 O2 Sat:      97 % on Room air Temp:     98.4 degrees F (36.89 degrees C) oral Pulse rate:   64 / minute Pulse rhythm:   regular BP sitting:   124 / 54  (left arm) Cuff size:   large  Vitals Entered By: Brenton Grills CMA Duncan Dull) (April 27, 2010 10:19 AM)  O2 Flow:  Room air CC: Post Hosp. F/U/aj Is Patient Diabetic? Yes Comments Pt is not currently taking Spironolactone and is currently on sliding scale of Novolog    Referring Provider:  Johny Sax, MD Primary Provider:  Marga Melnick, MD   CC:  Post Hosp. F/U/aj.  History of Present Illness: pt was hospitalized last week for hepatic encephalopathy. prior to hospitalization, cbg's were well-controlled.  he was d/c'ed onincreased lantus and  as needed novolog.  he brings a record of his cbg's which i have reviewed today. edema is worse. he has 2 weeks of moderate ulcers on both feet, and assoc drainage.    Current Medications (verified): 1)  Metoprolol Tartrate 25 Mg  Tabs (Metoprolol Tartrate) .... 1/2 By Mouth Two Times A Day 2)  Isosorbide Mononitrate Cr 60 Mg  Tb24 (Isosorbide Mononitrate) .Marland Kitchen.. 1 By Mouth Once Daily 3)  Diltiazem Hcl Er Beads 180 Mg  Cp24 (Diltiazem Hcl Er Beads) .Marland Kitchen.. 1 By Mouth Qd 4)  Furosemide 40 Mg  Tabs (Furosemide) .Marland Kitchen.. 1 By Mouth Qd 5)  Omeprazole 20 Mg  Tbec (Omeprazole) .... Take 1 Tablet By Mouth Once A Day 6)  Lantus 100 Unit/ml  Soln (Insulin Glargine) .... 25 Units At Bedtime. 7)  Vitamin C 500 Mg Tabs (Ascorbic Acid) .Marland Kitchen.. 1 By Mouth Once Daily 8)  Amitriptyline Hcl 10 Mg Tabs (Amitriptyline Hcl) .Marland Kitchen.. 1 Tablet At Bedtime 9)  B-1 250 Mg Tabs (Thiamine Hcl) .... 1/2 By Mouth Once Daily 10)  Lactulose 10 Gm/24ml Soln (Lactulose) .... Take 1 and 1/2 Tablespoons Two Times A Day 11)  Freestyle  Lancets  Misc (Lancets) .... Test 2-3 Times A Day  Dx 250.02 12)  Freestyle Lite Test  Strp (Glucose Blood) .... Test 2-3 Times Daily Dx 250.02 13)  Spironolactone 50 Mg Tabs (Spironolactone) .... Take 1 Tablet By Mouth Once A Day 14)  Ferrous Sulfate 325 (65 Fe) Mg  Tabs (Ferrous Sulfate) .Marland Kitchen.. 1 By Mouth Bid 15)  Insulin Syringe 31g X 5/16" 0.5 Ml Misc (Insulin Syringe-Needle U-100) .... Qid 16)  Calcium/magnesium/zinc Formula 1000-500-50 Mg Tabs (Calcium-Magnesium-Zinc) .... Take One Tablet By Mouth Once Daily. 17)  Vitamin E 400 Unit Caps (Vitamin E) .... Once Daily 18)  Talwin 30 Mg/ml Soln (Pentazocine Lactate) .... As Needed For Pain 19)  Xifaxan 550 Mg Tabs (Rifaximin) .... Take 1 Tablet By Mouth Two Times A Day 20)  Allopurinol 300 Mg Tabs (Allopurinol) .... Take 1/2 Tablet Once Daily 21)  Prednisone 5 Mg Tabs (Prednisone) .... Take One By Mouth Once Daily 22)  Vitamin D3 1000 Unit Tabs (Cholecalciferol) .... Two Tablet By Mouth Once Daily 23)  Polyethylene Glycol 3350  Powd (Polyethylene Glycol 3350) .Marland Kitchen.. 1 1/2 Tablespoons in Liquid Two Times A Day 24)  Penicillin V Potassium 250 Mg Tabs (Penicillin V  Potassium) .... Take 1 Tablet By Mouth Once A Day 25)  Athletes Foot 1 % Crea (Terbinafine Hcl) .... Apply Two Times A Day To Toes/nails For 1 Month 26)  Neurontin 100 Mg Caps (Gabapentin) .... Take 2 Three Times Daily 27)  Vitamin B-6 100 Mg Tabs (Pyridoxine Hcl) .... One Tablet By Mouth Once Daily 28)  Novolog 100 Unit/ml Soln (Insulin Aspart) .... 42 Units Three Times A Day (Just Before Each Meal) 29)  Cephalexin 500 Mg Caps (Cephalexin) .Marland Kitchen.. 1 By Mouth Four Times A Day X 7 Days For Uti  Allergies (verified): 1)  ! Sulfa 2)  ! * Dilaudid 3)  ! Codeine 4)  ! Morphine 5)  ! * Shellfish 6)  ! Demerol 7)  ! Zetia (Ezetimibe) 8)  ! Augmentin 9)  ! Asa 10)  Morphine Sulfate (Morphine Sulfate)  Past History:  Past Medical History: Last updated: 03/16/2010 Allergic  rhinitis GERD Osteoarthritis Alcoholic cirrhosis, Thrombocytopenia, Bleeding Esophageal Varices, s/p TIPS 2/10, encephalopathy Anemia, multifactorial Internal Hemorrhoids Colon Polyps adenomatous Diverticulosis Gastric Antral Vascular Ectasia Rhinophyma Melanoma resected by Moh's 2008 Diabetes, Type 2 CAD....myoview  2007  no ischemia / no ASA because of GI disease LV function .Marland Kitchen..echo September, 2010.Marland Kitchen EF 55%.. mild AI... aortic valve sclerosis Hypertension CABG...2004 Cellulitis RLE 10/2008 [place] ,[place] / recurrent episodes Dr. Ninetta Lights (ID) Atrial Fibrillation...amiodarone in past, but problems and left off meds. Gout Sleep Apnea.Marland KitchenMarland KitchenCPAP Carotid bruits???  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         he had low-grade fever   Physical Exam  General:  obese.  no distress  Pulses:  dorsalis pedis intact bilat, but decreased from normal (prob due to edema) Extremities:  no deformity.  no ulcer on the feet.  feet are of normal color and temp.   3+ right pedal edema and 3+ left pedal edema.   there are bilat large but shallow ulcers of the med malleolae.   Neurologic:  sensation is intact to touch on the feet, but decreased from normal Additional Exam:  outside test results are reviewed:  a1c in hosp was 7.1  Sodium (NA)                              132        l      135-145          mEq/L  Potassium (K)                            4.5               3.5-5.1          mEq/L  Chloride                                 98                96-112           mEq/L  CO2                                      26  19-32            mEq/L  Glucose                                  243        h      70-99            mg/dL  BUN                                      20                6-23             mg/dL  Creatinine                                1.21     Impression & Recommendations:  Problem # 1:  DIABETES MELLITUS, TYPE II, UNCONTROLLED (ICD-250.02) he needs to resume his pre-hospitalization insulin schedule, as he was doing well on that.  Problem # 2:  FOOT ULCER (ICD-707.15) bilat new  Problem # 3:  EDEMA (ICD-782.3) Assessment: Deteriorated  Medications Added to Medication List This Visit: 1)  Lantus 100 Unit/ml Soln (Insulin glargine) .... 25 units at bedtime. 2)  Lantus 100 Unit/ml Soln (Insulin glargine) .... 20 units at bedtime. 3)  Cephalexin 500 Mg Caps (Cephalexin) .Marland Kitchen.. 1 by mouth four times a day x 7 days for uti  Other Orders: Wound Care Center Referral (Wound Care) Est. Patient Level IV (01027)  Patient Instructions: 1)  tests are being ordered for you today.  a few days after the test(s), please call [phone] to hear your test results. 2)  pending the test results, please continue the same medications for now. 3)  check your blood sugar 2 times a day.  vary the time of day when you check, between before the 3 meals, and at bedtime.  also check if you have symptoms of your blood sugar being too high or too low.  please keep a record of the readings and bring it to your next appointment here.  please call us sooner if you are having low blood sugar episodes. 4)  Please schedule a follow-up appointment in 3 months. 5)  increase novolog back up to 42 units three times a day (just before each meal), and reduce lantus back to 20 units at bedtime.  6)  please see dr hopper as scheduled in 2 days. 7)  refer to wound-care.  you will be called with a day and time for an appointment. 8)  resume spironolactone, as listed below.   Orders Added: 1)  Wound Care Center Referral [Wound Care] 2)  Est. Patient Level IV [03474]

## 2010-05-05 ENCOUNTER — Telehealth: Payer: Self-pay | Admitting: Internal Medicine

## 2010-05-05 ENCOUNTER — Encounter (HOSPITAL_BASED_OUTPATIENT_CLINIC_OR_DEPARTMENT_OTHER): Payer: Medicare Other | Attending: General Surgery

## 2010-05-05 DIAGNOSIS — K7460 Unspecified cirrhosis of liver: Secondary | ICD-10-CM | POA: Insufficient documentation

## 2010-05-05 DIAGNOSIS — I10 Essential (primary) hypertension: Secondary | ICD-10-CM | POA: Insufficient documentation

## 2010-05-05 DIAGNOSIS — I739 Peripheral vascular disease, unspecified: Secondary | ICD-10-CM | POA: Insufficient documentation

## 2010-05-05 DIAGNOSIS — L97409 Non-pressure chronic ulcer of unspecified heel and midfoot with unspecified severity: Secondary | ICD-10-CM | POA: Insufficient documentation

## 2010-05-05 DIAGNOSIS — E119 Type 2 diabetes mellitus without complications: Secondary | ICD-10-CM | POA: Insufficient documentation

## 2010-05-05 DIAGNOSIS — E669 Obesity, unspecified: Secondary | ICD-10-CM | POA: Insufficient documentation

## 2010-05-05 DIAGNOSIS — Z79899 Other long term (current) drug therapy: Secondary | ICD-10-CM | POA: Insufficient documentation

## 2010-05-05 DIAGNOSIS — I519 Heart disease, unspecified: Secondary | ICD-10-CM | POA: Insufficient documentation

## 2010-05-05 DIAGNOSIS — L98499 Non-pressure chronic ulcer of skin of other sites with unspecified severity: Secondary | ICD-10-CM | POA: Insufficient documentation

## 2010-05-05 NOTE — Discharge Summary (Signed)
[name], [name]                   ACCOUNT NO.:  [id number]  MEDICAL RECORD NO.:  [id number]           PATIENT TYPE:  I  LOCATION:  5511                         FACILITY:  [facility]  PHYSICIAN:  Kathlen Mody, MD       DATE OF BIRTH:  May 14, 1935  DATE OF ADMISSION:  04/17/2010 DATE OF DISCHARGE:  04/20/2010                              DISCHARGE SUMMARY   PRIMARY CARE PHYSICIAN:  Dr. Marga Melnick.  CARDIOLOGIST:  Dr. Myrtis Ser.  DISCHARGE DIAGNOSIS:  Altered mental status secondary to hepatic encephalopathy.  OTHER DIAGNOSES: 1. Alcohol induced cirrhosis, status post transjugular intrahepatic     portosystemic shunt . 2. Chronic cellulitis, on daily penicillin, prescribed by Dr. Ninetta Lights. 3. Portal hypertension with esophageal varices. 4. Chronic diastolic heart failure. 5. Paroxysmal atrial fibrillation, in sinus rhythm. 6. Cerebrovascular accident. 7. Coronary artery disease, status post coronary artery bypass graft. 8. Type 2 diabetes mellitus. 9. Dyslipidemia. 10.Gout. 11.Thrombocytopenia. 12.Anemia. 13.Gastroesophageal reflux disease. 14.Osteoarthritis. 15.Diverticulosis. 16.Obstructive sleep apnea. 17.Pneumonia.  DISCHARGE MEDICATIONS: 1. Avelox 400 mg for 4 more days to complete the course of     antibiotics. 2. NovoLog sliding scale a.c. at bedtime. 3. Lantus 25 units at bedtime. 4. Lactulose 20 mL 4 times a day. 5. Metoprolol 12.5 mg twice a day. 6. Vitamin E 400 units 1 capsule daily. 7. Vitamin B1, 250 mg 1 tablet daily. 8. Allopurinol 300 mg half a tablet daily. 9. Diltiazem 180 mg 1 capsule daily. 10.Lasix 40 mg 1 tablet daily. 11.Omeprazole 20 mg 1 tablet daily. 12.Vitamin C 500 mg 1 tablet daily. 13.Amitriptyline 10 mg at bedtime. 14.Ferrous sulfate 1 tablet twice a day. 15.Calcium magnesium and zinc 1 tablet at bedtime. 16.Vitamin D3, 2000 units 2 tablets daily. 17.Prednisone 2.5 mg 1 tablet daily. 18.Rifaximin 550 mg twice a day. 19.Isosorbide XR 60  mg 1 tablet daily. 20.Polyethylene glycol 1.5 tablespoon p.o. daily. 21.Penicillin V potassium 250 mg 1 tablet daily. 22.Gabapentin 200 mg twice a day daily.  PERTINENT LABS:  On admission, the patient had a CBC which showed a hemoglobin of 12, hematocrit of 36.7, platelets of 60.  Urinalysis negative for nitrites and leukocytes.  Comprehensive metabolic panel showing a sodium of 136, potassium of 4.8, chloride 99, bicarb 27, glucose of 281, BUN 29, creatinine 1.52, bilirubin of 1.34. Procalcitonin of 0.10.  Lactic acid level 2.9.  Ammonia level on admission was 79.  Blood cultures, no growth.  Urine cultures negative. Hemoglobin A1c 7.1.  Discharge basic metabolic panel within normal limits except for a glucose of 169.  On the day of discharge, the patient's ammonia level is 48.  RADIOLOGY:  Chest x-ray showed improved air space disease within the left lung and improved vascular congestion.  An elbow x-ray shows diffuse soft tissue swelling.  CONSULTS CALLED:  None.  PROCEDURES DONE:  None.  HISTORY OF PRESENT ILLNESS:  This is a 75 year old gentleman with extensive past medical history of alcoholic cirrhosis, status post TIPS procedure with hepatic encephalopathy, portal hypertension, diastolic dysfunction, paroxysmal atrial fibrillation, type 2 diabetes, was brought in by the family for altered mental status, most likely  secondary to his hepatic encephalopathy, his ammonia level was elevated to 79 and also he was found to have pneumonia.  The patient's lactulose was increased from 20 mL to 4 times a day.  His mental status has improved.  He became more alert and awake. 1. Pneumonia.  The patient was started on ceftriaxone and Zithromax,     which were stopped and Avelox was started to complete the course of     antibiotics for 7 days. 2. Chronic cellulitis.  The patient is on penicillin V potassium,     indefinitely prescribed by Dr. Ninetta Lights for his foot blisters.     Wound  Care consult was called who recommended Mepilex bandage to     promote the healing. 3. Obstructive sleep apnea.  The patient and family were encouraged to     use BiPAP daily at night. 4. Diabetes mellitus.  CBC is at better control.  Hemoglobin A1c is 7.     Increased Lantus to 25 units and continue with NovoLog sliding     scale a.c. at bedtime, 5. Gout.  Continue with allopurinol and prednisone. 6. Thrombocytopenia, chronic and stable. 7. Anemia of chronic normocytic, on iron supplements. 8. Left elbow swelling, soft tissue x-ray showed soft tissue swelling     and there was a little bit of joint effusion.  The patient brought     up the easier possible tapping of the elbow joint.  The patient     refused for any interventions at this time.  [name] he will take pain     medications as needed. 9. Diastolic heart failure.  The patient is not in heart failure at     this time, restarted all his home medications.  PHYSICAL EXAMINATION:  On the day of discharge, the patient's vitals include temperature of 97.8, pulse of 71, respirations of 20, blood pressure 124/60, saturating 98% on room air.  General exam, he is alert and awake, sitting in chair comfortably.  Mental status is at baseline. Cardiovascular exam, S1-S2 heard.  No rubs, murmurs or gallops. Respiratory exam, air entry is fair.  No wheezes or rhonchi.  Abdomen is soft, obese, nontender, and nondistended.  Extremities, chronic cellulitic changes.  PLAN:  The patient at this time is hemodynamically stable for discharge and recommend to follow up with his PCP and [place] in 1-2 weeks and with Dr. Ninetta Lights for his chronic cellulitis in about a week and to follow the Wound Care recommendations for his foot blisters.          ______________________________ Kathlen Mody, MD     [facility]  D:  04/20/2010  T:  04/21/2010  Job:  [id number]  Electronically Signed by Kathlen Mody MD on 05/05/2010 09:16:25 PM

## 2010-05-06 ENCOUNTER — Encounter (INDEPENDENT_AMBULATORY_CARE_PROVIDER_SITE_OTHER): Payer: Medicare Other

## 2010-05-06 ENCOUNTER — Telehealth: Payer: Self-pay | Admitting: Internal Medicine

## 2010-05-06 DIAGNOSIS — L97909 Non-pressure chronic ulcer of unspecified part of unspecified lower leg with unspecified severity: Secondary | ICD-10-CM

## 2010-05-06 LAB — CBC
HCT: 28.1 % — ABNORMAL LOW (ref 39.0–52.0)
HCT: 28.2 % — ABNORMAL LOW (ref 39.0–52.0)
HCT: 29.1 % — ABNORMAL LOW (ref 39.0–52.0)
HCT: 32.3 % — ABNORMAL LOW (ref 39.0–52.0)
HCT: 36.9 % — ABNORMAL LOW (ref 39.0–52.0)
Hemoglobin: 11.2 g/dL — ABNORMAL LOW (ref 13.0–17.0)
Hemoglobin: 12.7 g/dL — ABNORMAL LOW (ref 13.0–17.0)
Hemoglobin: 9.9 g/dL — ABNORMAL LOW (ref 13.0–17.0)
MCH: 33.3 pg (ref 26.0–34.0)
MCH: 33.3 pg (ref 26.0–34.0)
MCH: 33.9 pg (ref 26.0–34.0)
MCH: 34 pg (ref 26.0–34.0)
MCHC: 34.3 g/dL (ref 30.0–36.0)
MCHC: 34.4 g/dL (ref 30.0–36.0)
MCHC: 34.5 g/dL (ref 30.0–36.0)
MCHC: 34.6 g/dL (ref 30.0–36.0)
MCHC: 34.8 g/dL (ref 30.0–36.0)
MCV: 95.7 fL (ref 78.0–100.0)
MCV: 96.2 fL (ref 78.0–100.0)
MCV: 97.5 fL (ref 78.0–100.0)
MCV: 97.7 fL (ref 78.0–100.0)
MCV: 98.6 fL (ref 78.0–100.0)
Platelets: 50 10*3/uL — ABNORMAL LOW (ref 150–400)
RBC: 3.31 MIL/uL — ABNORMAL LOW (ref 4.22–5.81)
RBC: 3.75 MIL/uL — ABNORMAL LOW (ref 4.22–5.81)
RDW: 14.8 % (ref 11.5–15.5)
RDW: 14.9 % (ref 11.5–15.5)
RDW: 15.1 % (ref 11.5–15.5)
WBC: 3.9 10*3/uL — ABNORMAL LOW (ref 4.0–10.5)
WBC: 8.6 10*3/uL (ref 4.0–10.5)

## 2010-05-06 LAB — GLUCOSE, CAPILLARY
Glucose-Capillary: 177 mg/dL — ABNORMAL HIGH (ref 70–99)
Glucose-Capillary: 247 mg/dL — ABNORMAL HIGH (ref 70–99)
Glucose-Capillary: 271 mg/dL — ABNORMAL HIGH (ref 70–99)
Glucose-Capillary: 278 mg/dL — ABNORMAL HIGH (ref 70–99)
Glucose-Capillary: 285 mg/dL — ABNORMAL HIGH (ref 70–99)
Glucose-Capillary: 307 mg/dL — ABNORMAL HIGH (ref 70–99)
Glucose-Capillary: 339 mg/dL — ABNORMAL HIGH (ref 70–99)

## 2010-05-06 LAB — PROCALCITONIN: Procalcitonin: 2.34 ng/mL

## 2010-05-06 LAB — BASIC METABOLIC PANEL
BUN: 16 mg/dL (ref 6–23)
BUN: 18 mg/dL (ref 6–23)
BUN: 22 mg/dL (ref 6–23)
CO2: 24 mEq/L (ref 19–32)
CO2: 27 mEq/L (ref 19–32)
CO2: 28 mEq/L (ref 19–32)
Calcium: 8.5 mg/dL (ref 8.4–10.5)
Chloride: 106 mEq/L (ref 96–112)
Chloride: 106 mEq/L (ref 96–112)
Chloride: 107 mEq/L (ref 96–112)
Chloride: 109 mEq/L (ref 96–112)
Creatinine, Ser: 1.22 mg/dL (ref 0.4–1.5)
Creatinine, Ser: 1.22 mg/dL (ref 0.4–1.5)
GFR calc non Af Amer: 48 mL/min — ABNORMAL LOW (ref >60)
GFR calc non Af Amer: 57 mL/min — ABNORMAL LOW (ref >60)
GFR calc non Af Amer: 59 mL/min — ABNORMAL LOW (ref >60)
Glucose, Bld: 174 mg/dL — ABNORMAL HIGH (ref 70–99)
Glucose, Bld: 190 mg/dL — ABNORMAL HIGH (ref 70–99)
Glucose, Bld: 199 mg/dL — ABNORMAL HIGH (ref 70–99)
Glucose, Bld: 201 mg/dL — ABNORMAL HIGH (ref 70–99)
Glucose, Bld: 316 mg/dL — ABNORMAL HIGH (ref 70–99)
Potassium: 3.7 mEq/L (ref 3.5–5.1)
Potassium: 3.8 mEq/L (ref 3.5–5.1)
Potassium: 4 mEq/L (ref 3.5–5.1)
Potassium: 4.1 mEq/L (ref 3.5–5.1)
Sodium: 135 mEq/L (ref 135–145)

## 2010-05-06 LAB — CULTURE, BLOOD (ROUTINE X 2)

## 2010-05-06 LAB — COMPREHENSIVE METABOLIC PANEL
ALT: 17 U/L (ref 0–53)
Alkaline Phosphatase: 73 U/L (ref 39–117)
CO2: 30 mEq/L (ref 19–32)
Calcium: 9.6 mg/dL (ref 8.4–10.5)
Chloride: 106 mEq/L (ref 96–112)
GFR calc non Af Amer: 50 mL/min — ABNORMAL LOW (ref >60)
Glucose, Bld: 80 mg/dL (ref 70–99)
Sodium: 142 mEq/L (ref 135–145)
Total Bilirubin: 1.7 mg/dL — ABNORMAL HIGH (ref 0.3–1.2)

## 2010-05-06 LAB — CARDIAC PANEL(CRET KIN+CKTOT+MB+TROPI): Total CK: 31 U/L (ref 7–232)

## 2010-05-06 LAB — DIFFERENTIAL
Basophils Absolute: 0 10*3/uL (ref 0.0–0.1)
Basophils Absolute: 0 10*3/uL (ref 0.0–0.1)
Basophils Absolute: 0 10*3/uL (ref 0.0–0.1)
Basophils Relative: 0 % (ref 0–1)
Basophils Relative: 0 % (ref 0–1)
Basophils Relative: 0 % (ref 0–1)
Eosinophils Absolute: 0 10*3/uL (ref 0.0–0.7)
Eosinophils Absolute: 0 10*3/uL (ref 0.0–0.7)
Eosinophils Absolute: 0 10*3/uL (ref 0.0–0.7)
Eosinophils Relative: 0 % (ref 0–5)
Eosinophils Relative: 0 % (ref 0–5)
Eosinophils Relative: 0 % (ref 0–5)
Eosinophils Relative: 1 % (ref 0–5)
Lymphocytes Relative: 5 % — ABNORMAL LOW (ref 12–46)
Lymphs Abs: 0.4 10*3/uL — ABNORMAL LOW (ref 0.7–4.0)
Lymphs Abs: 0.4 10*3/uL — ABNORMAL LOW (ref 0.7–4.0)
Monocytes Absolute: 0.2 10*3/uL (ref 0.1–1.0)
Monocytes Absolute: 0.3 10*3/uL (ref 0.1–1.0)
Monocytes Absolute: 0.4 10*3/uL (ref 0.1–1.0)
Monocytes Relative: 2 % — ABNORMAL LOW (ref 3–12)
Neutrophils Relative %: 90 % — ABNORMAL HIGH (ref 43–77)

## 2010-05-06 LAB — URINE CULTURE
Colony Count: NO GROWTH
Culture  Setup Time: 201109021913
Culture: NO GROWTH

## 2010-05-06 LAB — URINALYSIS, ROUTINE W REFLEX MICROSCOPIC
Glucose, UA: 100 mg/dL — AB
Hgb urine dipstick: NEGATIVE
Ketones, ur: NEGATIVE mg/dL
Protein, ur: NEGATIVE mg/dL
pH: 6 (ref 5.0–8.0)

## 2010-05-06 LAB — PROTIME-INR: Prothrombin Time: 16 seconds — ABNORMAL HIGH (ref 11.6–15.2)

## 2010-05-06 LAB — LIPASE, BLOOD: Lipase: 36 U/L (ref 23–300)

## 2010-05-07 ENCOUNTER — Telehealth: Payer: Self-pay | Admitting: Endocrinology

## 2010-05-10 ENCOUNTER — Other Ambulatory Visit (HOSPITAL_BASED_OUTPATIENT_CLINIC_OR_DEPARTMENT_OTHER): Payer: Self-pay | Admitting: General Surgery

## 2010-05-10 ENCOUNTER — Ambulatory Visit (HOSPITAL_COMMUNITY)
Admission: RE | Admit: 2010-05-10 | Discharge: 2010-05-10 | Disposition: A | Discharge status: Home / Self Care | Payer: Medicare Other | Source: Ambulatory Visit | Attending: Internal Medicine | Admitting: Internal Medicine

## 2010-05-10 ENCOUNTER — Ambulatory Visit (HOSPITAL_COMMUNITY)
Admission: RE | Admit: 2010-05-10 | Discharge: 2010-05-10 | Disposition: A | Discharge status: Home / Self Care | Payer: Medicare Other | Source: Ambulatory Visit | Attending: General Surgery | Admitting: General Surgery

## 2010-05-10 ENCOUNTER — Other Ambulatory Visit: Payer: Self-pay | Admitting: Internal Medicine

## 2010-05-10 ENCOUNTER — Telehealth: Payer: Self-pay | Admitting: Internal Medicine

## 2010-05-10 DIAGNOSIS — I10 Essential (primary) hypertension: Secondary | ICD-10-CM | POA: Insufficient documentation

## 2010-05-10 DIAGNOSIS — K7460 Unspecified cirrhosis of liver: Secondary | ICD-10-CM

## 2010-05-10 DIAGNOSIS — T148XXA Other injury of unspecified body region, initial encounter: Secondary | ICD-10-CM

## 2010-05-10 DIAGNOSIS — Z01818 Encounter for other preprocedural examination: Secondary | ICD-10-CM | POA: Insufficient documentation

## 2010-05-10 DIAGNOSIS — E119 Type 2 diabetes mellitus without complications: Secondary | ICD-10-CM | POA: Insufficient documentation

## 2010-05-10 DIAGNOSIS — Z951 Presence of aortocoronary bypass graft: Secondary | ICD-10-CM | POA: Insufficient documentation

## 2010-05-10 DIAGNOSIS — L97409 Non-pressure chronic ulcer of unspecified heel and midfoot with unspecified severity: Secondary | ICD-10-CM | POA: Insufficient documentation

## 2010-05-10 DIAGNOSIS — R188 Other ascites: Secondary | ICD-10-CM | POA: Insufficient documentation

## 2010-05-10 DIAGNOSIS — R609 Edema, unspecified: Secondary | ICD-10-CM | POA: Insufficient documentation

## 2010-05-10 DIAGNOSIS — I519 Heart disease, unspecified: Secondary | ICD-10-CM | POA: Insufficient documentation

## 2010-05-10 DIAGNOSIS — Z87891 Personal history of nicotine dependence: Secondary | ICD-10-CM | POA: Insufficient documentation

## 2010-05-10 DIAGNOSIS — K7030 Alcoholic cirrhosis of liver without ascites: Secondary | ICD-10-CM | POA: Insufficient documentation

## 2010-05-10 DIAGNOSIS — M5380 Other specified dorsopathies, site unspecified: Secondary | ICD-10-CM | POA: Insufficient documentation

## 2010-05-11 ENCOUNTER — Telehealth: Payer: Self-pay | Admitting: *Deleted

## 2010-05-11 DIAGNOSIS — R609 Edema, unspecified: Secondary | ICD-10-CM

## 2010-05-11 DIAGNOSIS — K7030 Alcoholic cirrhosis of liver without ascites: Secondary | ICD-10-CM

## 2010-05-11 NOTE — Assessment & Plan Note (Signed)
Summary: swelling of hand and feet/per Sheri/lk   Vital Signs:  Patient profile:   75 year old male Height:      76 inches Weight:      263 pounds BMI:     32.13 Pulse rate:   76 / minute Pulse rhythm:   regular BP sitting:   118 / 58  (left arm) Cuff size:   large  Vitals Entered By: Christie Nottingham CMA Duncan Dull) (May 04, 2010 10:47 AM)  History of Present Illness Visit Type: Follow-up Visit Primary GI MD: Stan Head MD [facility] Primary Provider: Marga Melnick, MD Requesting Provider: n/a Chief Complaint: Increase in swelling in feet and legs over 3 weeks. Pt had some left sided abd pain 3 weeks ago. Pt has some SOB and pain in knees and legs. History of Present Illness:   75 yo wm with alcoholic cirrhosis. He has had about 3 weeks of lower exremity swelling problems. Bilateral. Has had a brief hospitalization x 2 days - fever ? pneumonia 2/25 Back to ED also. has received Keflex and Rocephin, ? UTI he has had dyspnea on exertion. He has finished atibiotics he started a new mediction, prescribed by pain physician. Suboxone was taken 1 time 1 day before initial hospital visit. He was lethargic and sleepy after that and then went to hospital - 911 and ambulance. Swelling and lower extremity blisters noted after hospitalization. spironolactone was stopped at hospital. restarted by Dr. Everardo All 5 days ago. still on low sodium diet. Back pain persists and is an increasing problem, greatly limiting activity.   GI Review of Systems    Reports abdominal pain and  weight gain.     Location of  Abdominal pain: left sided.    Denies acid reflux, belching, bloating, chest pain, dysphagia with liquids, dysphagia with solids, heartburn, loss of appetite, nausea, vomiting, vomiting blood, and  weight loss.        Denies anal fissure, black tarry stools, change in bowel habit, constipation, diarrhea, diverticulosis, fecal incontinence, heme positive stool, hemorrhoids, irritable bowel syndrome,  jaundice, light color stool, liver problems, rectal bleeding, and  rectal pain.  Current Medications (verified): 1)  Metoprolol Tartrate 25 Mg  Tabs (Metoprolol Tartrate) .... 1/2 By Mouth Two Times A Day 2)  Isosorbide Mononitrate Cr 60 Mg  Tb24 (Isosorbide Mononitrate) .Marland Kitchen.. 1 By Mouth Once Daily 3)  Diltiazem Hcl Er Beads 180 Mg  Cp24 (Diltiazem Hcl Er Beads) .Marland Kitchen.. 1 By Mouth Qd 4)  Furosemide 40 Mg  Tabs (Furosemide) .Marland Kitchen.. 1 By Mouth Qd 5)  Omeprazole 20 Mg  Tbec (Omeprazole) .... Take 1 Tablet By Mouth Once A Day 6)  Lantus 100 Unit/ml  Soln (Insulin Glargine) .... 20 Units At Bedtime. 7)  Vitamin C 500 Mg Tabs (Ascorbic Acid) .Marland Kitchen.. 1 By Mouth Once Daily 8)  Amitriptyline Hcl 10 Mg Tabs (Amitriptyline Hcl) .Marland Kitchen.. 1 Tablet At Bedtime 9)  B-1 250 Mg Tabs (Thiamine Hcl) .... 1/2 By Mouth Once Daily 10)  Lactulose 10 Gm/31ml Soln (Lactulose) .... Take 1 and 1/2 Tablespoons Two Times A Day 11)  Freestyle Lancets  Misc (Lancets) .... Test 2-3 Times A Day  Dx 250.02 12)  Freestyle Lite Test  Strp (Glucose Blood) .... Test 2-3 Times Daily Dx 250.02 13)  Spironolactone 50 Mg Tabs (Spironolactone) .... Take 1 Tablet By Mouth Once A Day 14)  Ferrous Sulfate 325 (65 Fe) Mg  Tabs (Ferrous Sulfate) .Marland Kitchen.. 1 By Mouth Bid 15)  Insulin Syringe 31g X 5/16" 0.5  Ml Misc (Insulin Syringe-Needle U-100) .... Qid 16)  Calcium/magnesium/zinc Formula 1000-500-50 Mg Tabs (Calcium-Magnesium-Zinc) .... Take One Tablet By Mouth Once Daily. 17)  Vitamin E 400 Unit Caps (Vitamin E) .... Once Daily 18)  Talwin 30 Mg/ml Soln (Pentazocine Lactate) .... As Needed For Pain 19)  Xifaxan 550 Mg Tabs (Rifaximin) .... Take 1 Tablet By Mouth Two Times A Day 20)  Allopurinol 300 Mg Tabs (Allopurinol) .... Take 1/2 Tablet Once Daily 21)  Prednisone 5 Mg Tabs (Prednisone) .... Take One By Mouth Once Daily 22)  Vitamin D3 1000 Unit Tabs (Cholecalciferol) .... Two Tablet By Mouth Once Daily 23)  Polyethylene Glycol 3350  Powd (Polyethylene  Glycol 3350) .Marland Kitchen.. 1 1/2 Tablespoons in Liquid Two Times A Day 24)  Athletes Foot 1 % Crea (Terbinafine Hcl) .... Apply Two Times A Day To Toes/nails For 1 Month 25)  Neurontin 100 Mg Caps (Gabapentin) .... Take 2 Three Times Daily 26)  Novolog 100 Unit/ml Soln (Insulin Aspart) .... 42 Units Three Times A Day (Just Before Each Meal) 27)  Penicillin V Potassium 250 Mg Tabs (Penicillin V Potassium) .Marland Kitchen.. 1 By Mouth Once Daily  Allergies (verified): 1)  ! Sulfa 2)  ! * Dilaudid 3)  ! Codeine 4)  ! Morphine 5)  ! * Shellfish 6)  ! Demerol 7)  ! Zetia (Ezetimibe) 8)  ! Augmentin 9)  ! Asa 10)  Morphine Sulfate (Morphine Sulfate)  Past History:  Past Medical History: Reviewed history from 03/16/2010 and no changes required. 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 mild AI... aortic valve sclerosis Hypertension CABG...2004 Cellulitis RLE 10/2008 [place] ,[place] / recurrent episodes Dr. Ninetta Lights ([place]) Atrial Fibrillation...amiodarone in past, but problems and left off meds. Gout Sleep Apnea.Marland KitchenMarland KitchenCPAP Carotid bruits???  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 Colonoscopy 2005 Dr Leone Payor, 12/08 also 6 Bypass Total knee replacement- right knee EGD 12/08, 12/09, 2/10 TIPS 2/10 laminectomy x 4  hemorrhoidectomy inguinal herniograpphy contracture repair-bilaterally on hands right arm surgery hiatal hernia repair  Family  History: Reviewed history from 08/29/2008 and no changes required. Father: CA GI (stomach ???) Mother: Alzheimer's Siblings: MIs,CVAs, CA (melanoma) Family History of Diabetes: Brothers Family History of Heart Disease: 3 Brothers, 2 sisters Family History of Liver Disease/Cirrhosis:brother, sister No FH of Colon Cancer:  Social History: Reviewed history from 09/30/2008 and no changes required. Alcohol Use - None since Feb. 2010 Illicit Drug Use - no Patient does not get regular exercise.  Patient is a former smoker. -stopped over 40 years ago Occupation: retired married  Physical Exam  General:  in no acute distress; alert,appropriate and cooperative throughout examination chronically ill Lungs:  Clear throughout to auscultation. Heart:  Regular rate and rhythm; no murmurs, rubs,  or bruits. Abdomen:  ascites present with fluid wave  nontender Extremities:  bilateral tense edema below knees but not above bandages on both feet not warm no major erythema  Neurologic:  Alert and  oriented x3   Impression & Recommendations:  Problem # 1:  EDEMA (ICD-782.3) Assessment Deteriorated labs increase diuretics combo vol overload and circulation i  think he does not have anasarca  Problem # 2:  CIRRHOSIS, ALCOHOLIC (ICD-571.2) Assessment: Unchanged Patient needs refills on his spironolactone after BMET results come back. Orders: Paracentesis (Paracentesis) TLB-BMP (Basic Metabolic Panel-BMET) (80048-METABOL) TLB-BNP (B-Natriuretic Peptide) (83880-BNPR)  Problem # 3:  OTHER ASCITES (ICD-789.59) ? if he had SBP causing fever and decline lately was encephalopathic brieflly, doubt all from one dose of narcotic await paracentesis he may need chronic suppressive Abx - is on Penicillin for his LE's and celluitis but that would not cover SBP  Orders: Paracentesis (Paracentesis) TLB-BMP (Basic Metabolic Panel-BMET) (80048-METABOL) TLB-BNP (B-Natriuretic Peptide)  (83880-BNPR)  Problem # 4:  LOW BACK PAIN, CHRONIC (ICD-724.2) big problem for him and quality of life is sinking without good options unfortunately  Patient Instructions: 1)  Your physician requests that you go to the basement floor of our office to have the following labwork completed before leaving today: BMET, BNP 2)  You have been scheduled for an ultrasound guided abdominal paracentesis at [facility] on Monday 05/10/10. You should arrive at 10:45 am for registration. 3)  The medication list was reviewed and reconciled.  All changed / newly prescribed medications were explained.  A complete medication list was provided to the patient / caregiver. Prescriptions: LACTULOSE 10 GM/15ML SOLN (LACTULOSE) take 1 and 1/2 tablespoons two times a day  #4527 x 0   Entered by:   Lamona Curl CMA (AAMA)   Authorized by:   Iva Boop MD, [facility]   Signed by:   Lamona Curl CMA (AAMA) on 05/04/2010   Method used:   Faxed to ...       [facility] (mail-order)       [address]       [place], [place]  [id number]       Ph: [phone]       Fax: [phone]   RxID:   [id number]    Orders Added: 1)  Paracentesis [Paracentesis] 2)  TLB-BMP (Basic Metabolic Panel-BMET) [80048-METABOL] 3)  TLB-BNP (B-Natriuretic Peptide) [83880-BNPR]   Patient: [name] Note: All result statuses are Final unless otherwise noted.  Tests: (1) BMP (METABOL)   Sodium                    142 mEq/L                   135-145   Potassium                 3.9 mEq/L                   3.5-5.1   Chloride                  105 mEq/L                   96-112   Carbon Dioxide            29 mEq/L                    19-32   Glucose                   97 mg/dL                    40-34   BUN                       18 mg/dL  6-23   Creatinine                1.1 mg/dL                   1.6-1.0   Calcium                   9.3 mg/dL                   9.6-04.5   GFR                        69.88 mL/min                >60.00  Tests: (2) B-Type Natiuretic Peptide (BNPR)  B-Type Natriuetic Peptide                        [H]  399.8 pg/mL                 0.0-100.0  Note: An exclamation mark (!) indicates a result that was not dispersed into the flowsheet. Document Creation Date: 05/04/2010 2:26 PM

## 2010-05-11 NOTE — Discharge Summary (Signed)
Summary: Altered Medntal Status-Hepatic Encephalopathy  NAME:  [name], [name]                   ACCOUNT NO.:  [id number]      MEDICAL RECORD NO.:  [id number]           PATIENT TYPE:  I      LOCATION:  5511                         FACILITY:  [facility]      PHYSICIAN:  Kathlen Mody, MD       DATE OF BIRTH:  [birth year]      DATE OF ADMISSION:  04/17/2010   DATE OF DISCHARGE:  04/20/2010                                  DISCHARGE SUMMARY         PRIMARY CARE PHYSICIAN:  Dr. Marga Melnick.      CARDIOLOGIST:  Dr. Myrtis Ser.      DISCHARGE DIAGNOSIS:  Altered mental status secondary to hepatic   encephalopathy.      OTHER DIAGNOSES:   1. Alcohol induced cirrhosis, status post transjugular intrahepatic       portosystemic shunt .   2. Chronic cellulitis, on daily penicillin, prescribed by Dr. Ninetta Lights.   3. Portal hypertension with esophageal varices.   4. Chronic diastolic heart failure.   5. Paroxysmal atrial fibrillation, in sinus rhythm.   6. Cerebrovascular accident.   7. Coronary artery disease, status post coronary artery bypass graft.   8. Type 2 diabetes mellitus.   9. Dyslipidemia.   10.Gout.   11.Thrombocytopenia.   12.Anemia.   13.Gastroesophageal reflux disease.   14.Osteoarthritis.   15.Diverticulosis.   16.Obstructive sleep apnea.   17.Pneumonia.      DISCHARGE MEDICATIONS:   1. Avelox 400 mg for 4 more days to complete the course of       antibiotics.   2. NovoLog sliding scale a.c. at bedtime.   3. Lantus 25 units at bedtime.   4. Lactulose 20 mL 4 times a day.   5. Metoprolol 12.5 mg twice a day.   6. Vitamin E 400 units 1 capsule daily.   7. Vitamin B1, 250 mg 1 tablet daily.   8. Allopurinol 300 mg half a tablet daily.   9. Diltiazem 180 mg 1 capsule daily.   10.Lasix 40 mg 1 tablet daily.   11.Omeprazole 20 mg 1 tablet daily.   12.Vitamin C 500 mg 1 tablet daily.   13.Amitriptyline 10 mg at bedtime.   14.Ferrous sulfate 1 tablet twice a day.   15.Calcium  magnesium and zinc 1 tablet at bedtime.   16.Vitamin D3, 2000 units 2 tablets daily.   17.Prednisone 2.5 mg 1 tablet daily.   18.Rifaximin 550 mg twice a day.   19.Isosorbide XR 60 mg 1 tablet daily.   20.Polyethylene glycol 1.5 tablespoon p.o. daily.   21.Penicillin V potassium 250 mg 1 tablet daily.   22.Gabapentin 200 mg twice a day daily.      PERTINENT LABS:  On admission, the patient had a CBC which showed a   hemoglobin of 12, hematocrit of 36.7, platelets of 60.  Urinalysis   negative for nitrites and leukocytes.  Comprehensive metabolic panel   showing a sodium of 136, potassium of 4.8, chloride 99, bicarb 27,   glucose of  281, BUN 29, creatinine 1.52, bilirubin of 1.34.   Procalcitonin of 0.10.  Lactic acid level 2.9.  Ammonia level on   admission was 79.  Blood cultures, no growth.  Urine cultures negative.   Hemoglobin A1c 7.1.  Discharge basic metabolic panel within normal   limits except for a glucose of 169.  On the day of discharge, the   patient's ammonia level is 48.      RADIOLOGY:  Chest x-ray showed improved air space disease within the   left lung and improved vascular congestion.  An elbow x-ray shows   diffuse soft tissue swelling.      CONSULTS CALLED:  None.      PROCEDURES DONE:  None.      HISTORY OF PRESENT ILLNESS:  This is a 75 year old gentleman with   extensive past medical history of alcoholic cirrhosis, status post TIPS   procedure with hepatic encephalopathy, portal hypertension, diastolic   dysfunction, paroxysmal atrial fibrillation, type 2 diabetes, was   brought in by the family for altered mental status, most likely   secondary to his hepatic encephalopathy, his ammonia level was elevated   to 79 and also he was found to have pneumonia.  The patient's lactulose   was increased from 20 mL to 4 times a day.  His mental status has   improved.  He became more alert and awake.   1. Pneumonia.  The patient was started on ceftriaxone and  Zithromax,       which were stopped and Avelox was started to complete the course of       antibiotics for 7 days.   2. Chronic cellulitis.  The patient is on penicillin V potassium,       indefinitely prescribed by Dr. Ninetta Lights for his foot blisters.       Wound Care consult was called who recommended Mepilex bandage to       promote the healing.   3. Obstructive sleep apnea.  The patient and family were encouraged to       use BiPAP daily at night.   4. Diabetes mellitus.  CBC is at better control.  Hemoglobin A1c is 7.       Increased Lantus to 25 units and continue with NovoLog sliding       scale a.c. at bedtime,   5. Gout.  Continue with allopurinol and prednisone.   6. Thrombocytopenia, chronic and stable.   7. Anemia of chronic normocytic, on iron supplements.   8. Left elbow swelling, soft tissue x-ray showed soft tissue swelling       and there was a little bit of joint effusion.  The patient brought       up the easier possible tapping of the elbow joint.  The patient       refused for any interventions at this time.  [name] he will take pain       medications as needed.   9. Diastolic heart failure.  The patient is not in heart failure at       this time, restarted all his home medications.      PHYSICAL EXAMINATION:  On the day of discharge, the patient's vitals   include temperature of 97.8, pulse of 71, respirations of 20, blood   pressure 124/60, saturating 98% on room air.  General exam, he is alert   and awake, sitting in chair comfortably.  Mental status is at baseline.   Cardiovascular exam, S1-S2 heard.  No  rubs, murmurs or gallops.   Respiratory exam, air entry is fair.  No wheezes or rhonchi.  Abdomen is   soft, obese, nontender, and nondistended.  Extremities, chronic   cellulitic changes.      PLAN:  The patient at this time is hemodynamically stable for discharge   and recommend to follow up with his PCP and [place] in 1-2 weeks and   with Dr. Ninetta Lights for his  chronic cellulitis in about a week and to   follow the Wound Care recommendations for his foot blisters.                  ______________________________   Kathlen Mody, MD               VA/MEDQ  D:  04/20/2010  T:  04/21/2010  Job:  [id number]

## 2010-05-11 NOTE — Progress Notes (Signed)
Summary: increase diuretics  Phone Note Outgoing Call   Summary of Call: Labs (BMET) ok Have him take two 50 mg spironolactone daily and two 40 mg furosemide daily each AM. weigh daily and record we will call him with more instructions after paracentesis Monday Iva Boop MD, [facility]  May 05, 2010 2:33 PM   Follow-up for Phone Call        [facility] for patient to call back. Graciella Freer RN  May 05, 2010 2:49 PM   Notified patient's wife to have patient take 2- 50mg  Spironolactone tabs daily and 2-40mg  Furosemide daily each am. Patient is to weigh daily and record. Our office will call him after the paracentesis on Monday with further instructions. Wife stated patient doesn't have enough Spironolactone so I ordered script to [facility]. Wife stated understanding with instructions. Follow-up by: Graciella Freer RN,  May 05, 2010 3:31 PM    New/Updated Medications: SPIRONOLACTONE 50 MG TABS (SPIRONOLACTONE) Take 2 tabs daily or as directed Prescriptions: SPIRONOLACTONE 50 MG TABS (SPIRONOLACTONE) Take 2 tabs daily or as directed  #90 x 0   Entered by:   Graciella Freer RN   Authorized by:   Iva Boop MD, [facility]   Signed by:   Graciella Freer RN on 05/05/2010   Method used:   Electronically to        [facility] [address] (retail)       [address], [address]       [place], [place]  [address]       Ph: [phone]       Fax: [phone]   RxID:   [id number]

## 2010-05-11 NOTE — Progress Notes (Signed)
Summary: Rx refill req  Phone Note Refill Request Message from:  Patient on May 07, 2010 3:07 PM  Refills Requested: Medication #1:  INSULIN SYRINGE 31G X 5/16" 0.5 ML MISC qid   Dosage confirmed as above?Dosage Confirmed   Supply Requested: 3 months  Method Requested: Electronic Initial call taken by: Margaret Pyle, CMA,  May 07, 2010 3:08 PM    Prescriptions: INSULIN SYRINGE 31G X 5/16" 0.5 ML MISC (INSULIN SYRINGE-NEEDLE U-100) qid  #120 x 2   Entered by:   Margaret Pyle, CMA   Authorized by:   Minus Breeding MD   Signed by:   Margaret Pyle, CMA on 05/07/2010   Method used:   Electronically to        [facility] [address] (retail)       [address], [address]       [place], [place]  [address]       Ph: [phone]       Fax: [phone]   RxID:   [id number]

## 2010-05-11 NOTE — Telephone Encounter (Signed)
Message copied by Graciella Freer on Tue May 11, 2010  2:16 PM ------      Message from: Stan Head      Created: Tue May 11, 2010  8:12 AM       Let patient/wife know that I have seen [place] and no ascites - good news.      Am trying to help with leg swelling by using increased diuretics.      i know you left message in old EMR re: what his weights are.      Waiting on follow-up of that from you.      I signed off [employer] note.

## 2010-05-11 NOTE — Telephone Encounter (Signed)
Spoke with pt's wife who stated the pt is very weak- he gets sob walking from the bedroom to the bathroom. Right leg/ankle  remains swolllen. Wt a couple of days ago 254lbs and today 250lbs.

## 2010-05-11 NOTE — Progress Notes (Signed)
Summary: [facility]   Phone Note From Pharmacy   Caller: [facility]  Call For: [name]   Summary of Call: Patients RX carrier called [facility] to verify a RX sent on 05-04-10 for Lactulose. Patty Mason verified [employer] and will send out to patient Initial call taken by: Ok Anis CMA,  May 06, 2010 8:32 AM

## 2010-05-11 NOTE — Telephone Encounter (Signed)
I was unable to reach her on cell or home # He is going to need a BMP tomorrow or next day Use cirrhosis and edema diagnoses

## 2010-05-12 ENCOUNTER — Other Ambulatory Visit: Payer: Self-pay | Admitting: *Deleted

## 2010-05-12 ENCOUNTER — Other Ambulatory Visit: Payer: Medicare Other

## 2010-05-12 DIAGNOSIS — Z8679 Personal history of other diseases of the circulatory system: Secondary | ICD-10-CM

## 2010-05-12 LAB — URINALYSIS, ROUTINE W REFLEX MICROSCOPIC
Bilirubin Urine: NEGATIVE
Hgb urine dipstick: NEGATIVE
Ketones, ur: NEGATIVE mg/dL
Nitrite: NEGATIVE
Protein, ur: NEGATIVE mg/dL
Specific Gravity, Urine: 1.026 (ref 1.005–1.030)
Urobilinogen, UA: 0.2 mg/dL (ref 0.0–1.0)

## 2010-05-12 LAB — CBC
HCT: 34.9 % — ABNORMAL LOW (ref 39.0–52.0)
HCT: 35.5 % — ABNORMAL LOW (ref 39.0–52.0)
Hemoglobin: 12.3 g/dL — ABNORMAL LOW (ref 13.0–17.0)
MCHC: 34.5 g/dL (ref 30.0–36.0)
MCHC: 35 g/dL (ref 30.0–36.0)
MCHC: 35 g/dL (ref 30.0–36.0)
MCV: 97.1 fL (ref 78.0–100.0)
RBC: 3.19 MIL/uL — ABNORMAL LOW (ref 4.22–5.81)
RBC: 3.59 MIL/uL — ABNORMAL LOW (ref 4.22–5.81)
RBC: 3.62 MIL/uL — ABNORMAL LOW (ref 4.22–5.81)
RDW: 15.8 % — ABNORMAL HIGH (ref 11.5–15.5)
RDW: 15.9 % — ABNORMAL HIGH (ref 11.5–15.5)

## 2010-05-12 LAB — COMPREHENSIVE METABOLIC PANEL
ALT: 19 U/L (ref 0–53)
ALT: 20 U/L (ref 0–53)
AST: 31 U/L (ref 0–37)
AST: 35 U/L (ref 0–37)
Albumin: 3.3 g/dL — ABNORMAL LOW (ref 3.5–5.2)
Alkaline Phosphatase: 61 U/L (ref 39–117)
CO2: 26 mEq/L (ref 19–32)
Calcium: 8.2 mg/dL — ABNORMAL LOW (ref 8.4–10.5)
GFR calc Af Amer: >60 mL/min (ref >60)
GFR calc Af Amer: >60 mL/min (ref >60)
Potassium: 3.6 mEq/L (ref 3.5–5.1)
Sodium: 136 mEq/L (ref 135–145)
Sodium: 139 mEq/L (ref 135–145)
Total Protein: 5.4 g/dL — ABNORMAL LOW (ref 6.0–8.3)
Total Protein: 6.4 g/dL (ref 6.0–8.3)

## 2010-05-12 LAB — HEPATIC FUNCTION PANEL
ALT: 19 U/L (ref 0–53)
Alkaline Phosphatase: 54 U/L (ref 39–117)
Bilirubin, Direct: 0.3 mg/dL (ref 0.0–0.3)
Indirect Bilirubin: 1.2 mg/dL — ABNORMAL HIGH (ref 0.3–0.9)
Total Protein: 5.8 g/dL — ABNORMAL LOW (ref 6.0–8.3)

## 2010-05-12 LAB — BASIC METABOLIC PANEL
CO2: 29 mEq/L (ref 19–32)
Glucose, Bld: 167 mg/dL — ABNORMAL HIGH (ref 70–99)
Potassium: 3.8 mEq/L (ref 3.5–5.1)
Sodium: 138 mEq/L (ref 135–145)

## 2010-05-12 LAB — POCT I-STAT, CHEM 8
Calcium, Ion: 1.03 mmol/L — ABNORMAL LOW (ref 1.12–1.32)
Creatinine, Ser: 1.3 mg/dL (ref 0.4–1.5)
Glucose, Bld: 216 mg/dL — ABNORMAL HIGH (ref 70–99)
HCT: 34 % — ABNORMAL LOW (ref 39.0–52.0)
Hemoglobin: 11.6 g/dL — ABNORMAL LOW (ref 13.0–17.0)
TCO2: 24 mmol/L (ref 0–100)

## 2010-05-12 LAB — LIPASE, BLOOD: Lipase: 22 U/L (ref 11–59)

## 2010-05-12 LAB — GLUCOSE, CAPILLARY
Glucose-Capillary: 100 mg/dL — ABNORMAL HIGH (ref 70–99)
Glucose-Capillary: 141 mg/dL — ABNORMAL HIGH (ref 70–99)
Glucose-Capillary: 230 mg/dL — ABNORMAL HIGH (ref 70–99)
Glucose-Capillary: 280 mg/dL — ABNORMAL HIGH (ref 70–99)
Glucose-Capillary: 302 mg/dL — ABNORMAL HIGH (ref 70–99)
Glucose-Capillary: 59 mg/dL — ABNORMAL LOW (ref 70–99)

## 2010-05-12 LAB — STOOL CULTURE

## 2010-05-12 LAB — PROTIME-INR: INR: 1.44 (ref 0.00–1.49)

## 2010-05-12 LAB — CLOSTRIDIUM DIFFICILE EIA: C difficile Toxins A+B, EIA: NEGATIVE

## 2010-05-12 LAB — HEMOCCULT GUIAC POC 1CARD (OFFICE): Fecal Occult Bld: NEGATIVE

## 2010-05-12 MED ORDER — ISOSORBIDE MONONITRATE ER 30 MG PO TB24: 60.0000 mg | ORAL_TABLET | Freq: Every day | ORAL | Status: DC

## 2010-05-12 NOTE — Telephone Encounter (Signed)
Ok Will review labs and then make changes

## 2010-05-12 NOTE — Telephone Encounter (Signed)
LMOM for wife to call back. Ordered labs for pt.

## 2010-05-12 NOTE — Telephone Encounter (Signed)
Dr Leone Payor, pt had 2 md appt's today; weight was 245lbs. He will come for [facility] tomorrow

## 2010-05-13 ENCOUNTER — Ambulatory Visit (HOSPITAL_COMMUNITY)
Admission: RE | Admit: 2010-05-13 | Discharge: 2010-05-13 | Disposition: A | Discharge status: Home / Self Care | Payer: Medicare Other | Source: Ambulatory Visit | Attending: General Surgery | Admitting: General Surgery

## 2010-05-13 ENCOUNTER — Other Ambulatory Visit (INDEPENDENT_AMBULATORY_CARE_PROVIDER_SITE_OTHER): Payer: Medicare Other

## 2010-05-13 ENCOUNTER — Telehealth: Payer: Self-pay | Admitting: Internal Medicine

## 2010-05-13 ENCOUNTER — Other Ambulatory Visit (HOSPITAL_BASED_OUTPATIENT_CLINIC_OR_DEPARTMENT_OTHER): Payer: Self-pay | Admitting: General Surgery

## 2010-05-13 DIAGNOSIS — I359 Nonrheumatic aortic valve disorder, unspecified: Secondary | ICD-10-CM

## 2010-05-13 DIAGNOSIS — R609 Edema, unspecified: Secondary | ICD-10-CM

## 2010-05-13 DIAGNOSIS — K7030 Alcoholic cirrhosis of liver without ascites: Secondary | ICD-10-CM

## 2010-05-13 DIAGNOSIS — E785 Hyperlipidemia, unspecified: Secondary | ICD-10-CM | POA: Insufficient documentation

## 2010-05-13 DIAGNOSIS — I509 Heart failure, unspecified: Secondary | ICD-10-CM | POA: Insufficient documentation

## 2010-05-13 DIAGNOSIS — I080 Rheumatic disorders of both mitral and aortic valves: Secondary | ICD-10-CM | POA: Insufficient documentation

## 2010-05-13 DIAGNOSIS — I10 Essential (primary) hypertension: Secondary | ICD-10-CM | POA: Insufficient documentation

## 2010-05-13 DIAGNOSIS — I079 Rheumatic tricuspid valve disease, unspecified: Secondary | ICD-10-CM | POA: Insufficient documentation

## 2010-05-13 DIAGNOSIS — I4891 Unspecified atrial fibrillation: Secondary | ICD-10-CM | POA: Insufficient documentation

## 2010-05-13 DIAGNOSIS — E119 Type 2 diabetes mellitus without complications: Secondary | ICD-10-CM | POA: Insufficient documentation

## 2010-05-13 DIAGNOSIS — I2510 Atherosclerotic heart disease of native coronary artery without angina pectoris: Secondary | ICD-10-CM | POA: Insufficient documentation

## 2010-05-13 LAB — BASIC METABOLIC PANEL
Chloride: 98 mEq/L (ref 96–112)
Creatinine, Ser: 1.4 mg/dL (ref 0.4–1.5)
GFR: 53.78 mL/min — ABNORMAL LOW (ref >60.00)
Potassium: 4.3 mEq/L (ref 3.5–5.1)

## 2010-05-13 NOTE — Procedures (Unsigned)
DUPLEX DEEP VENOUS EXAM - LOWER EXTREMITY  INDICATION:  Ulceration of the bilateral lower extremities.  HISTORY:  Edema:  Bilaterally, right greater than left. Trauma/Surgery:  Right greater saphenous vein harvested for CABG. Pain:  Yes. PE:  No. Previous DVT:  No. Anticoagulants:  Patient unsure. Other:  DUPLEX EXAM:               CFV   SFV   PopV  PTV    GSV               R  L  R  L  R  L  R   L  R     L Thrombosis    o  o  o  o  o  o  o   o  Absent   o Spontaneous   +  +  +  +  +  +               + Phasic        +  +  +  +  +  +               + Augmentation  +  +  +  +  +  +               + Compressible  +  +  +  +  +  +  +   +        + Competent     +  +  +  +  +  +               +  Legend:  + - yes  o - no  p - partial  D - decreased  IMPRESSION: 1. Technically difficult and limited evaluation of calf veins due to     marked edema and vessel depth. 2. Bilateral lower extremity deep venous systems appear patent and     without evidence of deep venous thrombosis or reflux. 3. Right greater saphenous vein is absent due to previous surgical     intervention. 4. Left greater saphenous vein is patent and without evidence of     thrombus or reflux present.   _____________________________ Janetta Hora. Fields, MD  SH/MEDQ  D:  05/06/2010  T:  05/06/2010  Job:  [id number]

## 2010-05-13 NOTE — Telephone Encounter (Signed)
Reviewed BMET and [place] She says his LE edema is better 1) told to return to uusual furosemide at 40 mg daily and usual spironolactone at 50 mg daily - hasd been on twice that temporarily 2) see me by June routine, sooner prn

## 2010-05-14 ENCOUNTER — Ambulatory Visit: Payer: Self-pay | Admitting: Endocrinology

## 2010-05-19 ENCOUNTER — Ambulatory Visit (INDEPENDENT_AMBULATORY_CARE_PROVIDER_SITE_OTHER): Payer: Medicare Other | Admitting: Infectious Diseases

## 2010-05-19 ENCOUNTER — Encounter: Payer: Self-pay | Admitting: Infectious Diseases

## 2010-05-19 VITALS — BP 144/58 | HR 68 | Temp 98.5°F | Ht 76.0 in | Wt 244.4 lb

## 2010-05-19 DIAGNOSIS — L03119 Cellulitis of unspecified part of limb: Secondary | ICD-10-CM

## 2010-05-19 DIAGNOSIS — L02419 Cutaneous abscess of limb, unspecified: Secondary | ICD-10-CM

## 2010-05-19 NOTE — Progress Notes (Signed)
  Subjective:    Patient ID: [name], male    DOB: 03-03-1935, 75 y.o.   MRN: [id number]  HPI History of Present Illness:  75 yo M with hx of DM2, CABG, recurrent LE cullulitis,  and alcoholic chirrhosis s/p tips. He was am to [facility] hospital 10-23-09 to 10-28-09 for fever, nausea, vomiting, chills, right lower extremity cellulitis. He had BCx drawn on adm and was found to have 1/2 + for Pseudomonas aeruginosa (pan-sens). He was d/c home on IV levaquin. Has had no problems with antibiotics. Completed 11-11-09. Had f/u Bcx from [facility] (only 1) done on 11-13-09.  Leg is improved. No skin breakdown, has mild swelling which is at his baseline. No f/c.  Has had 4 episodes of LE cellulitis 2010. He was seen in ID clinic for f/u and was started on oral Pen VK for prophylaxis of his recurrent episodes of cellulitis. He was given topical rx for his onychomycosis (terbinafine too high risk given his cirrhosis).  He was admitted to the hospital 04-17-10 to 04-20-10 for hepatic encephalopathy and pneumonia. His wife attributes this to a medication he was given sublingually.  He was treated with lactulose, rifaximin, avelox and improved. His LE wounds were eval by wound care in the hospital. Has been developing large blisters on his heels. He was eval by wound care 3-14 as outpt, had local debridement and was being considered for HBO. He had u/s of LE that did not show DVTs but significant edema.   His FSGs have been "high" per his wife. His spironolactone and lasix have been increased due increasing LE edema as well. He had an abd u/s to eval for paracentesis but no fluid was found.  No fevers or chills since seen in ED 3-3, was told he had UTI and was put on keflex (UCx and BCx negative, anbx subsequently stopped). No fevers since. LE swelling has improved.       Review of Systems     Objective:   Physical Exam  Constitutional: He appears well-developed and well-nourished.  Skin:             Assessment &  Plan:

## 2010-05-19 NOTE — Assessment & Plan Note (Signed)
He appears to be doing well, his current superficial wounds are healing well. He and his wife has questions about HBO therapy. I suggested that the data for this is mixed at best but there are some patients who anecdotally do well. I suggested that they discuss this with their wound MD. I rec that we continue his prophylactic PEN VK. Will see him back in 3-4 months, sooner if his cellulitis recurrs. I have asked them to call me if his cellulitis recurrs.

## 2010-05-20 NOTE — Progress Notes (Signed)
Summary: ? re meds  Phone Note Call from Patient Call back at Home Phone [phone]   Caller: spouse [name] Call For: Dr Leone Payor Reason for Call: Talk to Nurse Summary of Call: Wife has questions regarding husbands fluid pills medication. Initial call taken by: Tawni Levy,  May 10, 2010 1:45 PM  Follow-up for Phone Call        Wife called to ask if patient's diuertics will change; patient had his U/S today and she stated they didn't find any fluid. 05/05/10 increased Lasix to 80mg  once daily and Spironalactone to 100mg . I looked in EPIC and the report is still pending. Explained to wife we will get back to her- she stated understanding. Follow-up by: Graciella Freer RN,  May 10, 2010 4:13 PM  Additional Follow-up for Phone Call Additional follow up Details #1::        was also treatung leg edema - so stay there for now does she have weights on him? Additional Follow-up by: Iva Boop MD, Clementeen Graham,  May 10, 2010 4:45 PM    Additional Follow-up for Phone Call Additional follow up Details #2::    [facility] for wife to call. Informed her Dr Leone Payor would like for patient to remain on the same dose of meds. Also asked her to inform us if she has any weights on patient.Graciella Freer RN  May 10, 2010 4:51 PM

## 2010-05-25 ENCOUNTER — Encounter (HOSPITAL_BASED_OUTPATIENT_CLINIC_OR_DEPARTMENT_OTHER): Payer: Medicare Other | Attending: General Surgery

## 2010-05-25 DIAGNOSIS — I10 Essential (primary) hypertension: Secondary | ICD-10-CM | POA: Insufficient documentation

## 2010-05-25 DIAGNOSIS — E119 Type 2 diabetes mellitus without complications: Secondary | ICD-10-CM | POA: Insufficient documentation

## 2010-05-25 DIAGNOSIS — L98499 Non-pressure chronic ulcer of skin of other sites with unspecified severity: Secondary | ICD-10-CM | POA: Insufficient documentation

## 2010-05-25 DIAGNOSIS — Z79899 Other long term (current) drug therapy: Secondary | ICD-10-CM | POA: Insufficient documentation

## 2010-05-25 DIAGNOSIS — K7460 Unspecified cirrhosis of liver: Secondary | ICD-10-CM | POA: Insufficient documentation

## 2010-05-25 DIAGNOSIS — I739 Peripheral vascular disease, unspecified: Secondary | ICD-10-CM | POA: Insufficient documentation

## 2010-05-25 DIAGNOSIS — I519 Heart disease, unspecified: Secondary | ICD-10-CM | POA: Insufficient documentation

## 2010-05-25 DIAGNOSIS — L97409 Non-pressure chronic ulcer of unspecified heel and midfoot with unspecified severity: Secondary | ICD-10-CM | POA: Insufficient documentation

## 2010-05-25 DIAGNOSIS — E669 Obesity, unspecified: Secondary | ICD-10-CM | POA: Insufficient documentation

## 2010-05-26 LAB — COMPREHENSIVE METABOLIC PANEL
ALT: 40 U/L (ref 0–53)
AST: 58 U/L — ABNORMAL HIGH (ref 0–37)
Alkaline Phosphatase: 84 U/L (ref 39–117)
CO2: 28 mEq/L (ref 19–32)
Calcium: 8.8 mg/dL (ref 8.4–10.5)
Chloride: 104 mEq/L (ref 96–112)
GFR calc non Af Amer: 56 mL/min — ABNORMAL LOW (ref >60)
Glucose, Bld: 126 mg/dL — ABNORMAL HIGH (ref 70–99)
Potassium: 4.1 mEq/L (ref 3.5–5.1)
Sodium: 138 mEq/L (ref 135–145)
Total Bilirubin: 0.7 mg/dL (ref 0.3–1.2)

## 2010-05-26 LAB — BASIC METABOLIC PANEL
CO2: 25 mEq/L (ref 19–32)
Calcium: 8.5 mg/dL (ref 8.4–10.5)
Chloride: 106 mEq/L (ref 96–112)
GFR calc Af Amer: >60 mL/min (ref >60)
Sodium: 139 mEq/L (ref 135–145)

## 2010-05-26 LAB — GLUCOSE, CAPILLARY
Glucose-Capillary: 104 mg/dL — ABNORMAL HIGH (ref 70–99)
Glucose-Capillary: 205 mg/dL — ABNORMAL HIGH (ref 70–99)
Glucose-Capillary: 97 mg/dL (ref 70–99)

## 2010-05-27 LAB — COMPREHENSIVE METABOLIC PANEL
AST: 40 U/L — ABNORMAL HIGH (ref 0–37)
AST: 48 U/L — ABNORMAL HIGH (ref 0–37)
AST: 55 U/L — ABNORMAL HIGH (ref 0–37)
Albumin: 2.6 g/dL — ABNORMAL LOW (ref 3.5–5.2)
Albumin: 3 g/dL — ABNORMAL LOW (ref 3.5–5.2)
Alkaline Phosphatase: 56 U/L (ref 39–117)
Alkaline Phosphatase: 86 U/L (ref 39–117)
BUN: 15 mg/dL (ref 6–23)
BUN: 14 mg/dL (ref 6–23)
CO2: 24 mEq/L (ref 19–32)
CO2: 25 mEq/L (ref 19–32)
Calcium: 8.1 mg/dL — ABNORMAL LOW (ref 8.4–10.5)
Calcium: 8.4 mg/dL (ref 8.4–10.5)
Chloride: 106 mEq/L (ref 96–112)
Chloride: 107 mEq/L (ref 96–112)
Chloride: 109 mEq/L (ref 96–112)
Chloride: 105 mEq/L (ref 96–112)
Creatinine, Ser: 0.87 mg/dL (ref 0.4–1.5)
Creatinine, Ser: 1.05 mg/dL (ref 0.4–1.5)
Creatinine, Ser: 1.27 mg/dL (ref 0.4–1.5)
GFR calc Af Amer: >60 mL/min (ref >60)
GFR calc non Af Amer: 56 mL/min — ABNORMAL LOW (ref >60)
GFR calc non Af Amer: >60 mL/min (ref >60)
Glucose, Bld: 105 mg/dL — ABNORMAL HIGH (ref 70–99)
Glucose, Bld: 106 mg/dL — ABNORMAL HIGH (ref 70–99)
Glucose, Bld: 146 mg/dL — ABNORMAL HIGH (ref 70–99)
Potassium: 3.9 mEq/L (ref 3.5–5.1)
Potassium: 3.8 mEq/L (ref 3.5–5.1)
Total Bilirubin: 0.9 mg/dL (ref 0.3–1.2)
Total Bilirubin: 1 mg/dL (ref 0.3–1.2)
Total Bilirubin: 1.2 mg/dL (ref 0.3–1.2)
Total Bilirubin: 1.1 mg/dL (ref 0.3–1.2)

## 2010-05-27 LAB — GLUCOSE, CAPILLARY
Glucose-Capillary: 104 mg/dL — ABNORMAL HIGH (ref 70–99)
Glucose-Capillary: 106 mg/dL — ABNORMAL HIGH (ref 70–99)
Glucose-Capillary: 115 mg/dL — ABNORMAL HIGH (ref 70–99)
Glucose-Capillary: 115 mg/dL — ABNORMAL HIGH (ref 70–99)
Glucose-Capillary: 120 mg/dL — ABNORMAL HIGH (ref 70–99)
Glucose-Capillary: 130 mg/dL — ABNORMAL HIGH (ref 70–99)
Glucose-Capillary: 141 mg/dL — ABNORMAL HIGH (ref 70–99)
Glucose-Capillary: 143 mg/dL — ABNORMAL HIGH (ref 70–99)
Glucose-Capillary: 103 mg/dL — ABNORMAL HIGH (ref 70–99)
Glucose-Capillary: 112 mg/dL — ABNORMAL HIGH (ref 70–99)
Glucose-Capillary: 120 mg/dL — ABNORMAL HIGH (ref 70–99)
Glucose-Capillary: 164 mg/dL — ABNORMAL HIGH (ref 70–99)
Glucose-Capillary: 172 mg/dL — ABNORMAL HIGH (ref 70–99)
Glucose-Capillary: 172 mg/dL — ABNORMAL HIGH (ref 70–99)
Glucose-Capillary: 204 mg/dL — ABNORMAL HIGH (ref 70–99)
Glucose-Capillary: 74 mg/dL (ref 70–99)

## 2010-05-27 LAB — CBC
HCT: 27.9 % — ABNORMAL LOW (ref 39.0–52.0)
HCT: 28.3 % — ABNORMAL LOW (ref 39.0–52.0)
HCT: 28.8 % — ABNORMAL LOW (ref 39.0–52.0)
HCT: 31.1 % — ABNORMAL LOW (ref 39.0–52.0)
HCT: 32.3 % — ABNORMAL LOW (ref 39.0–52.0)
Hemoglobin: 10.1 g/dL — ABNORMAL LOW (ref 13.0–17.0)
Hemoglobin: 10.4 g/dL — ABNORMAL LOW (ref 13.0–17.0)
Hemoglobin: 9.6 g/dL — ABNORMAL LOW (ref 13.0–17.0)
Hemoglobin: 9.7 g/dL — ABNORMAL LOW (ref 13.0–17.0)
Hemoglobin: 9.8 g/dL — ABNORMAL LOW (ref 13.0–17.0)
Hemoglobin: 10 g/dL — ABNORMAL LOW (ref 13.0–17.0)
MCHC: 33.6 g/dL (ref 30.0–36.0)
MCHC: 34 g/dL (ref 30.0–36.0)
MCHC: 34.6 g/dL (ref 30.0–36.0)
MCV: 97.3 fL (ref 78.0–100.0)
MCV: 97.4 fL (ref 78.0–100.0)
MCV: 98 fL (ref 78.0–100.0)
MCV: 98.2 fL (ref 78.0–100.0)
MCV: 97 fL (ref 78.0–100.0)
Platelets: 39 10*3/uL — CL (ref 150–400)
Platelets: 45 10*3/uL — CL (ref 150–400)
Platelets: 67 10*3/uL — ABNORMAL LOW (ref 150–400)
Platelets: 69 10*3/uL — ABNORMAL LOW (ref 150–400)
RBC: 2.88 MIL/uL — ABNORMAL LOW (ref 4.22–5.81)
RBC: 2.94 MIL/uL — ABNORMAL LOW (ref 4.22–5.81)
RBC: 2.98 MIL/uL — ABNORMAL LOW (ref 4.22–5.81)
RBC: 3.13 MIL/uL — ABNORMAL LOW (ref 4.22–5.81)
RBC: 3 MIL/uL — ABNORMAL LOW (ref 4.22–5.81)
RBC: 3.3 MIL/uL — ABNORMAL LOW (ref 4.22–5.81)
RDW: 15.9 % — ABNORMAL HIGH (ref 11.5–15.5)
RDW: 16.1 % — ABNORMAL HIGH (ref 11.5–15.5)
RDW: 16.3 % — ABNORMAL HIGH (ref 11.5–15.5)
WBC: 4.2 10*3/uL (ref 4.0–10.5)
WBC: 5 10*3/uL (ref 4.0–10.5)
WBC: 9 10*3/uL (ref 4.0–10.5)
WBC: 7.1 10*3/uL (ref 4.0–10.5)
WBC: 8.4 10*3/uL (ref 4.0–10.5)

## 2010-05-27 LAB — BASIC METABOLIC PANEL
BUN: 11 mg/dL (ref 6–23)
CO2: 22 mEq/L (ref 19–32)
CO2: 27 mEq/L (ref 19–32)
Calcium: 8.5 mg/dL (ref 8.4–10.5)
Calcium: 8.8 mg/dL (ref 8.4–10.5)
Chloride: 106 mEq/L (ref 96–112)
Chloride: 108 mEq/L (ref 96–112)
Creatinine, Ser: 0.96 mg/dL (ref 0.4–1.5)
Creatinine, Ser: 1.32 mg/dL (ref 0.4–1.5)
GFR calc Af Amer: >60 mL/min (ref >60)
GFR calc Af Amer: >60 mL/min (ref >60)
GFR calc non Af Amer: 54 mL/min — ABNORMAL LOW (ref >60)
GFR calc non Af Amer: 59 mL/min — ABNORMAL LOW (ref >60)
Glucose, Bld: 115 mg/dL — ABNORMAL HIGH (ref 70–99)
Glucose, Bld: 168 mg/dL — ABNORMAL HIGH (ref 70–99)
Potassium: 3.4 mEq/L — ABNORMAL LOW (ref 3.5–5.1)
Sodium: 135 mEq/L (ref 135–145)
Sodium: 139 mEq/L (ref 135–145)
Sodium: 142 mEq/L (ref 135–145)

## 2010-05-27 LAB — TSH: TSH: 2.124 u[IU]/mL (ref 0.350–4.500)

## 2010-05-27 LAB — DIFFERENTIAL
Basophils Absolute: 0 10*3/uL (ref 0.0–0.1)
Eosinophils Absolute: 0 10*3/uL (ref 0.0–0.7)
Eosinophils Absolute: 0 10*3/uL (ref 0.0–0.7)
Eosinophils Absolute: 0 10*3/uL (ref 0.0–0.7)
Eosinophils Relative: 0 % (ref 0–5)
Eosinophils Relative: 0 % (ref 0–5)
Lymphocytes Relative: 27 % (ref 12–46)
Lymphocytes Relative: 9 % — ABNORMAL LOW (ref 12–46)
Lymphocytes Relative: 14 % (ref 12–46)
Lymphs Abs: 0.8 10*3/uL (ref 0.7–4.0)
Lymphs Abs: 1.1 10*3/uL (ref 0.7–4.0)
Lymphs Abs: 1 10*3/uL (ref 0.7–4.0)
Monocytes Absolute: 0.5 10*3/uL (ref 0.1–1.0)
Monocytes Relative: 12 % (ref 3–12)
Neutro Abs: 5.5 10*3/uL (ref 1.7–7.7)
Neutrophils Relative %: 84 % — ABNORMAL HIGH (ref 43–77)
Neutrophils Relative %: 78 % — ABNORMAL HIGH (ref 43–77)

## 2010-05-27 LAB — PROTIME-INR
INR: 1.62 — ABNORMAL HIGH (ref 0.00–1.49)
Prothrombin Time: 19 seconds — ABNORMAL HIGH (ref 11.6–15.2)
Prothrombin Time: 19.1 seconds — ABNORMAL HIGH (ref 11.6–15.2)

## 2010-05-27 LAB — CULTURE, BLOOD (ROUTINE X 2): Culture: NO GROWTH

## 2010-05-27 LAB — CROSSMATCH: Antibody Screen: NEGATIVE

## 2010-05-27 LAB — LACTIC ACID, PLASMA: Lactic Acid, Venous: 2.4 mmol/L — ABNORMAL HIGH (ref 0.5–2.2)

## 2010-05-27 LAB — APTT: aPTT: 36 seconds (ref 24–37)

## 2010-05-27 LAB — HEMOGLOBIN A1C: Mean Plasma Glucose: 192 mg/dL

## 2010-05-27 LAB — LIPASE, BLOOD: Lipase: 20 U/L (ref 11–59)

## 2010-05-31 ENCOUNTER — Ambulatory Visit (INDEPENDENT_AMBULATORY_CARE_PROVIDER_SITE_OTHER): Payer: Medicare Other | Admitting: Pulmonary Disease

## 2010-05-31 ENCOUNTER — Encounter: Payer: Self-pay | Admitting: Pulmonary Disease

## 2010-05-31 VITALS — BP 112/70 | HR 59 | Temp 98.2°F | Ht 76.0 in | Wt 255.2 lb

## 2010-05-31 DIAGNOSIS — G4730 Sleep apnea, unspecified: Secondary | ICD-10-CM

## 2010-05-31 NOTE — Patient Instructions (Signed)
We will schedule you for a sleep study & set up a CPAP machine if you qualify

## 2010-05-31 NOTE — Progress Notes (Signed)
  Subjective:    Patient ID: [name], male    DOB: 09-07-1935, 75 y.o.   MRN: [id number]  HPI 74/M for management of obstructive sleep apnea & excessive somnolence. He has ETOH related cirrhosis, sober x 2 yrs, Last ammonia level is 1 in march' 12, On chronic steroids for joint pain, chronic bipedal edema,  Being treated at wound center for cellulitis  Initial sleep study '01 , Dr Delford Field got him a cPAP machine He used to wear a CPAP all the time , not worn it in 6 months Had melanoma resected from his nose 2 years ago.He was unable to use CPAP in the hospital  Per wife he snores less than before, she has witnessed apneas but ' not as bad 'a s before Bedtime 10-12 MN, falls asleep easy, 3 awakenings,  oob at 0730 , tired, daytime naps +, he has gained 20 lbs in the last 2 years ESS 18 ,     Review of Systems  Constitutional: Positive for unexpected weight change. Negative for fever and appetite change.  HENT: Positive for congestion. Negative for ear pain, sore throat, rhinorrhea, sneezing, trouble swallowing, dental problem and postnasal drip.   Eyes: Negative for redness.  Respiratory: Positive for shortness of breath. Negative for cough and wheezing.   Cardiovascular: Positive for leg swelling. Negative for chest pain and palpitations.  Gastrointestinal: Negative for nausea, vomiting, abdominal pain and diarrhea.  Genitourinary: Negative for dysuria and urgency.  Musculoskeletal: Negative for joint swelling.  Skin: Negative for rash.  Neurological: Positive for headaches. Negative for syncope.  Hematological: Does not bruise/bleed easily.  Psychiatric/Behavioral: Negative for dysphoric mood. The patient is not nervous/anxious.        Objective:   Physical Exam Gen. Pleasant, chronically ill appearing, in no distress, normal affect ENT - no lesions, no post nasal drip Neck: No JVD, no thyromegaly, no carotid bruits Lungs: no use of accessory muscles, no dullness to percussion,  clear without rales or rhonchi  Cardiovascular: Rhythm regular, heart sounds  normal, no murmurs or gallops, no peripheral edema Abdomen: soft and non-tender, no hepatosplenomegaly, BS normal. Musculoskeletal: No deformities, no cyanosis or clubbing, RLE in bandage Neuro:  alert, non focal        Assessment & Plan:

## 2010-06-03 ENCOUNTER — Encounter: Payer: Self-pay | Admitting: Pulmonary Disease

## 2010-06-03 LAB — COMPREHENSIVE METABOLIC PANEL
AST: 48 U/L — ABNORMAL HIGH (ref 0–37)
AST: 48 U/L — ABNORMAL HIGH (ref 0–37)
Albumin: 2.8 g/dL — ABNORMAL LOW (ref 3.5–5.2)
Albumin: 3.4 g/dL — ABNORMAL LOW (ref 3.5–5.2)
Alkaline Phosphatase: 118 U/L — ABNORMAL HIGH (ref 39–117)
BUN: 15 mg/dL (ref 6–23)
Calcium: 9.5 mg/dL (ref 8.4–10.5)
Chloride: 106 mEq/L (ref 96–112)
Chloride: 112 mEq/L (ref 96–112)
Creatinine, Ser: 0.9 mg/dL (ref 0.4–1.5)
GFR calc Af Amer: >60 mL/min (ref >60)
Potassium: 3.7 mEq/L (ref 3.5–5.1)
Total Bilirubin: 0.8 mg/dL (ref 0.3–1.2)
Total Protein: 5.6 g/dL — ABNORMAL LOW (ref 6.0–8.3)

## 2010-06-03 LAB — BRAIN NATRIURETIC PEPTIDE: Pro B Natriuretic peptide (BNP): 184 pg/mL — ABNORMAL HIGH (ref 0.0–100.0)

## 2010-06-03 LAB — CBC
HCT: 28.7 % — ABNORMAL LOW (ref 39.0–52.0)
HCT: 33.8 % — ABNORMAL LOW (ref 39.0–52.0)
Hemoglobin: 11.2 g/dL — ABNORMAL LOW (ref 13.0–17.0)
Hemoglobin: 9.7 g/dL — ABNORMAL LOW (ref 13.0–17.0)
Platelets: 66 10*3/uL — ABNORMAL LOW (ref 150–400)
Platelets: 99 10*3/uL — ABNORMAL LOW (ref 150–400)
RBC: 3.09 MIL/uL — ABNORMAL LOW (ref 4.22–5.81)
RDW: 18.5 % — ABNORMAL HIGH (ref 11.5–15.5)
RDW: 18.8 % — ABNORMAL HIGH (ref 11.5–15.5)
RDW: 19 % — ABNORMAL HIGH (ref 11.5–15.5)
WBC: 4.8 10*3/uL (ref 4.0–10.5)
WBC: 6.8 10*3/uL (ref 4.0–10.5)

## 2010-06-03 LAB — CROSSMATCH

## 2010-06-03 LAB — DIFFERENTIAL
Eosinophils Relative: 0 % (ref 0–5)
Lymphocytes Relative: 22 % (ref 12–46)
Lymphs Abs: 1.1 10*3/uL (ref 0.7–4.0)
Monocytes Absolute: 0.4 10*3/uL (ref 0.1–1.0)
Monocytes Relative: 7 % (ref 3–12)

## 2010-06-03 LAB — SEDIMENTATION RATE: Sed Rate: 47 mm/hr — ABNORMAL HIGH (ref 0–16)

## 2010-06-03 LAB — URINALYSIS, ROUTINE W REFLEX MICROSCOPIC
Glucose, UA: NEGATIVE mg/dL
pH: 7 (ref 5.0–8.0)

## 2010-06-03 LAB — URINE CULTURE: Colony Count: 10000

## 2010-06-03 LAB — PROTIME-INR: Prothrombin Time: 18 seconds — ABNORMAL HIGH (ref 11.6–15.2)

## 2010-06-03 LAB — GLUCOSE, CAPILLARY
Glucose-Capillary: 153 mg/dL — ABNORMAL HIGH (ref 70–99)
Glucose-Capillary: 188 mg/dL — ABNORMAL HIGH (ref 70–99)
Glucose-Capillary: 216 mg/dL — ABNORMAL HIGH (ref 70–99)
Glucose-Capillary: 228 mg/dL — ABNORMAL HIGH (ref 70–99)
Glucose-Capillary: 232 mg/dL — ABNORMAL HIGH (ref 70–99)

## 2010-06-03 LAB — HEMOCCULT GUIAC POC 1CARD (OFFICE): Fecal Occult Bld: POSITIVE

## 2010-06-03 LAB — VITAMIN B12: Vitamin B-12: 424 pg/mL (ref 211–911)

## 2010-06-03 NOTE — Assessment & Plan Note (Signed)
Unable to track down older study. Given excessive daytime somnolence, narrow pharyngeal exam, witnessed apneas & loud snoring, obstructive sleep apnea is very likely & an overnight polysomnogram will be scheduled as a split study. The pathophysiology of obstructive sleep apnea , it's cardiovascular consequences & modes of treatment including CPAP were discused with the patient in detail & they evidenced understanding. Encouraged compliance with goal of at least 4-6 hrs every night is the expectation. Advised against medications with sedative side effects Cautioned against driving when sleepy - understanding that sleepiness will vary on a day to day basis

## 2010-06-08 LAB — CROSSMATCH: ABO/RH(D): A POS

## 2010-06-08 LAB — GLUCOSE, CAPILLARY
Glucose-Capillary: 141 mg/dL — ABNORMAL HIGH (ref 70–99)
Glucose-Capillary: 162 mg/dL — ABNORMAL HIGH (ref 70–99)
Glucose-Capillary: 178 mg/dL — ABNORMAL HIGH (ref 70–99)
Glucose-Capillary: 179 mg/dL — ABNORMAL HIGH (ref 70–99)
Glucose-Capillary: 182 mg/dL — ABNORMAL HIGH (ref 70–99)
Glucose-Capillary: 183 mg/dL — ABNORMAL HIGH (ref 70–99)
Glucose-Capillary: 193 mg/dL — ABNORMAL HIGH (ref 70–99)
Glucose-Capillary: 193 mg/dL — ABNORMAL HIGH (ref 70–99)
Glucose-Capillary: 232 mg/dL — ABNORMAL HIGH (ref 70–99)
Glucose-Capillary: 272 mg/dL — ABNORMAL HIGH (ref 70–99)

## 2010-06-08 LAB — DIFFERENTIAL
Basophils Relative: 0 % (ref 0–1)
Eosinophils Absolute: 0 10*3/uL (ref 0.0–0.7)
Eosinophils Relative: 0 % (ref 0–5)
Lymphocytes Relative: 14 % (ref 12–46)
Lymphocytes Relative: 28 % (ref 12–46)
Lymphs Abs: 1.4 10*3/uL (ref 0.7–4.0)
Lymphs Abs: 1.7 10*3/uL (ref 0.7–4.0)
Monocytes Absolute: 0.3 10*3/uL (ref 0.1–1.0)
Monocytes Absolute: 0.8 10*3/uL (ref 0.1–1.0)
Monocytes Relative: 6 % (ref 3–12)
Monocytes Relative: 7 % (ref 3–12)
Neutro Abs: 3.2 10*3/uL (ref 1.7–7.7)

## 2010-06-08 LAB — CBC
HCT: 24.2 % — ABNORMAL LOW (ref 39.0–52.0)
HCT: 25.3 % — ABNORMAL LOW (ref 39.0–52.0)
HCT: 27.7 % — ABNORMAL LOW (ref 39.0–52.0)
HCT: 30.4 % — ABNORMAL LOW (ref 39.0–52.0)
Hemoglobin: 10.6 g/dL — ABNORMAL LOW (ref 13.0–17.0)
Hemoglobin: 8.1 g/dL — ABNORMAL LOW (ref 13.0–17.0)
Hemoglobin: 8.6 g/dL — ABNORMAL LOW (ref 13.0–17.0)
Hemoglobin: 8.6 g/dL — ABNORMAL LOW (ref 13.0–17.0)
Hemoglobin: 9.7 g/dL — ABNORMAL LOW (ref 13.0–17.0)
MCHC: 34.9 g/dL (ref 30.0–36.0)
MCHC: 35.1 g/dL (ref 30.0–36.0)
MCV: 101.8 fL — ABNORMAL HIGH (ref 78.0–100.0)
MCV: 98 fL (ref 78.0–100.0)
MCV: 98.6 fL (ref 78.0–100.0)
Platelets: 46 10*3/uL — CL (ref 150–400)
Platelets: 50 10*3/uL — ABNORMAL LOW (ref 150–400)
Platelets: 51 10*3/uL — ABNORMAL LOW (ref 150–400)
Platelets: 54 10*3/uL — ABNORMAL LOW (ref 150–400)
RBC: 2.36 MIL/uL — ABNORMAL LOW (ref 4.22–5.81)
RDW: 18 % — ABNORMAL HIGH (ref 11.5–15.5)
WBC: 12 10*3/uL — ABNORMAL HIGH (ref 4.0–10.5)
WBC: 4.9 10*3/uL (ref 4.0–10.5)
WBC: 4.9 10*3/uL (ref 4.0–10.5)
WBC: 5.4 10*3/uL (ref 4.0–10.5)
WBC: 6 10*3/uL (ref 4.0–10.5)

## 2010-06-08 LAB — COMPREHENSIVE METABOLIC PANEL
BUN: 44 mg/dL — ABNORMAL HIGH (ref 6–23)
Calcium: 8 mg/dL — ABNORMAL LOW (ref 8.4–10.5)
Glucose, Bld: 207 mg/dL — ABNORMAL HIGH (ref 70–99)
Sodium: 141 mEq/L (ref 135–145)
Total Protein: 4.9 g/dL — ABNORMAL LOW (ref 6.0–8.3)

## 2010-06-08 LAB — PROTIME-INR
INR: 1.4 (ref 0.00–1.49)
Prothrombin Time: 17.5 seconds — ABNORMAL HIGH (ref 11.6–15.2)

## 2010-06-08 LAB — BASIC METABOLIC PANEL
Chloride: 102 mEq/L (ref 96–112)
GFR calc non Af Amer: >60 mL/min (ref >60)
Potassium: 4.1 mEq/L (ref 3.5–5.1)
Sodium: 136 mEq/L (ref 135–145)

## 2010-06-08 LAB — HEPATIC FUNCTION PANEL
ALT: 43 U/L (ref 0–53)
Alkaline Phosphatase: 88 U/L (ref 39–117)
Bilirubin, Direct: 0.3 mg/dL (ref 0.0–0.3)
Indirect Bilirubin: 1.4 mg/dL — ABNORMAL HIGH (ref 0.3–0.9)
Total Bilirubin: 1.7 mg/dL — ABNORMAL HIGH (ref 0.3–1.2)

## 2010-06-08 LAB — HEMOGLOBIN AND HEMATOCRIT, BLOOD
HCT: 26.7 % — ABNORMAL LOW (ref 39.0–52.0)
Hemoglobin: 8.3 g/dL — ABNORMAL LOW (ref 13.0–17.0)
Hemoglobin: 9.1 g/dL — ABNORMAL LOW (ref 13.0–17.0)
Hemoglobin: 9.1 g/dL — ABNORMAL LOW (ref 13.0–17.0)

## 2010-06-20 ENCOUNTER — Ambulatory Visit (HOSPITAL_BASED_OUTPATIENT_CLINIC_OR_DEPARTMENT_OTHER): Payer: Medicare Other | Attending: Pulmonary Disease

## 2010-06-20 DIAGNOSIS — E119 Type 2 diabetes mellitus without complications: Secondary | ICD-10-CM | POA: Insufficient documentation

## 2010-06-20 DIAGNOSIS — K7460 Unspecified cirrhosis of liver: Secondary | ICD-10-CM | POA: Insufficient documentation

## 2010-06-20 DIAGNOSIS — G4733 Obstructive sleep apnea (adult) (pediatric): Secondary | ICD-10-CM | POA: Insufficient documentation

## 2010-06-20 DIAGNOSIS — Z79899 Other long term (current) drug therapy: Secondary | ICD-10-CM | POA: Insufficient documentation

## 2010-06-22 ENCOUNTER — Encounter (HOSPITAL_BASED_OUTPATIENT_CLINIC_OR_DEPARTMENT_OTHER): Payer: Medicare Other | Attending: General Surgery

## 2010-06-22 DIAGNOSIS — E1149 Type 2 diabetes mellitus with other diabetic neurological complication: Secondary | ICD-10-CM | POA: Insufficient documentation

## 2010-06-22 DIAGNOSIS — I10 Essential (primary) hypertension: Secondary | ICD-10-CM | POA: Insufficient documentation

## 2010-06-22 DIAGNOSIS — Z79899 Other long term (current) drug therapy: Secondary | ICD-10-CM | POA: Insufficient documentation

## 2010-06-22 DIAGNOSIS — J449 Chronic obstructive pulmonary disease, unspecified: Secondary | ICD-10-CM | POA: Insufficient documentation

## 2010-06-22 DIAGNOSIS — I2510 Atherosclerotic heart disease of native coronary artery without angina pectoris: Secondary | ICD-10-CM | POA: Insufficient documentation

## 2010-06-22 DIAGNOSIS — I739 Peripheral vascular disease, unspecified: Secondary | ICD-10-CM | POA: Insufficient documentation

## 2010-06-22 DIAGNOSIS — I509 Heart failure, unspecified: Secondary | ICD-10-CM | POA: Insufficient documentation

## 2010-06-22 DIAGNOSIS — I4891 Unspecified atrial fibrillation: Secondary | ICD-10-CM | POA: Insufficient documentation

## 2010-06-22 DIAGNOSIS — J4489 Other specified chronic obstructive pulmonary disease: Secondary | ICD-10-CM | POA: Insufficient documentation

## 2010-06-22 DIAGNOSIS — E1142 Type 2 diabetes mellitus with diabetic polyneuropathy: Secondary | ICD-10-CM | POA: Insufficient documentation

## 2010-06-22 DIAGNOSIS — E1169 Type 2 diabetes mellitus with other specified complication: Secondary | ICD-10-CM | POA: Insufficient documentation

## 2010-06-22 DIAGNOSIS — L97509 Non-pressure chronic ulcer of other part of unspecified foot with unspecified severity: Secondary | ICD-10-CM | POA: Insufficient documentation

## 2010-06-22 DIAGNOSIS — Z794 Long term (current) use of insulin: Secondary | ICD-10-CM | POA: Insufficient documentation

## 2010-06-22 DIAGNOSIS — K219 Gastro-esophageal reflux disease without esophagitis: Secondary | ICD-10-CM | POA: Insufficient documentation

## 2010-06-24 ENCOUNTER — Telehealth: Payer: Self-pay | Admitting: Pulmonary Disease

## 2010-06-24 DIAGNOSIS — G4733 Obstructive sleep apnea (adult) (pediatric): Secondary | ICD-10-CM

## 2010-06-24 DIAGNOSIS — E119 Type 2 diabetes mellitus without complications: Secondary | ICD-10-CM

## 2010-06-24 DIAGNOSIS — G4730 Sleep apnea, unspecified: Secondary | ICD-10-CM

## 2010-06-24 NOTE — Telephone Encounter (Signed)
Let him know that PSh showed mild obstructive sleep apnea , stopped breathing about 10 times/ hour Weight loss will help If he felt better with CPAP, we can continue this . Based on conversation pl  order for autoCPAP 5-12 with med quattro mask, download in 4 weeks.

## 2010-06-25 NOTE — Procedures (Addendum)
NAMEREZNOR, FERRANDO                   ACCOUNT NO.:  [id number]  MEDICAL RECORD NO.:  [id number]          PATIENT TYPE:  OUT  LOCATION:  SLEEP CENTER                 FACILITY[facility]  [facility]  PHYSICIAN:  Oretha Milch, MD      DATE OF BIRTH:  [birth year]  DATE OF STUDY:  06/20/2010                           NOCTURNAL POLYSOMNOGRAM  REFERRING PHYSICIAN:  RAKESH V. ALVA  INDICATIONS FOR THE STUDY:  Mr. Tramell is a 75 year old gentleman with cirrhosis, diabetes, excessive daytime somnolence, non-refreshing sleep with witnessed apneas.  He apparently had obstructive sleep apnea diagnosed in 2001, but the study is not available at the time of dictation.  He was then put on CPAP, but has not been using this for the last 6 months.  On the time of this study, he weighed 250 pounds with a height of 6 feet 4 inches, BMI of 30.  Epworth sleepiness score was 15. Neck circumference is 18 inches.  MEDICATIONS AT THE TIME OF THIS STUDY:  Xifaxan, vitamins, omeprazole, amitriptyline, metoprolol, isosorbide mononitrate, diltiazem, tramadol, iron, lactulose, furosemide, spironolactone, allopurinol, prednisone, and Lantus.  This nocturnal polysomnogram was performed with sleep technologist in attendance.  EEG, EOG, EMG, EKG, and respiratory parameters were recorded.  Sleep stages arousals, limb movements, and respiratory data were scored according to criteria laid out by the American Academy of Sleep Medicine.  SLEEP ARCHITECTURE:  Lights out was at 10:06 p.m., lights on was at 4:46 a.m.  Total sleep time was 290 minutes with a sleep period time of 382 minutes and sleep efficiency of 73%.  Sleep latency was 15 minutes. Latency to REM sleep was 190 minutes, and wake after sleep onset was 94 minutes.  Sleep stages of the percentage of total sleep time was N1 10%, N2 69%, N3 0%, REM sleep 20% (60 minutes).  Supine sleep accounted for 55 minutes and supine REM sleep was seen for 24 minutes.  The longest period of  REM sleep was around 2:00 a.m.  AROUSAL DATA:  There were total of 39 arousals with an arousal index of 8 events per hour of these 23 were spontaneous, and the rest were associated with respiratory events.  RESPIRATORY DATA:  There were total of 11 obstructive apneas, 6 central apneas, 0 mixed apneas, and 39 hypopneas with an apnea/hypopnea index of 11.6 events per hour, and very few RERAs were noted with an RDI of 12 events per hour.  These events were not related to sleep stage or body position.  LIMB MOVEMENT DATA:  No significant limb movements were noted.  OXYGEN SATURATION DATA:  The lowest desaturation was 81%.  The desaturation index was 11 events per hour.  He spent 0.9 minutes with a saturation less than 88%.  CARDIAC DATA:  The minimal heart rate was 30 beats per minute, the high heart rate recorded was an artifact.  No arrhythmias were noted.  DISCUSSION:  He was desensitized with a medium Quattro full-face mask, but did not have sufficient respiratory events to warrant CPAP intervention as per the split protocol, absence of slow wave sleep.  IMPRESSION: 1. Mild obstructive sleep apnea with hypopneas causing oxygen  desaturation and sleep fragmentation. 2. No evidence of cardiac arrhythmias, limb movements, or behavioral     disturbance during sleep.  RECOMMENDATIONS:  The treatment options for this degree of sleep disordered breathing include weight loss, oral appliance or CPAP therapy. He should try to avoid medications with sedative side effects. He should be cautioned against driving when sleepy.     Oretha Milch, MD Electronically Signed    RVA/MEDQ  D:  06/24/2010 13:21:21  T:  06/25/2010 01:48:54  Job:  [id number]

## 2010-06-28 ENCOUNTER — Other Ambulatory Visit: Payer: Self-pay | Admitting: Internal Medicine

## 2010-06-29 NOTE — Telephone Encounter (Signed)
I informed pt of RA's findings and recs. Pt states he agrees to CPAP. Order sent.

## 2010-07-06 NOTE — Assessment & Plan Note (Signed)
[facility]                            CARDIOLOGY OFFICE NOTE   NAME:[name], [name]                          MRN:          [id number]  DATE:09/28/2007                            DOB:          1935/09/23    [name] is seen for cardiology followup.  Fortunately, he is doing  well.  He is not having any chest pain.  He is not having any  significant shortness of breath.  He has some rare palpitations.  We  know that this is from some scattered PVCs and not a significant problem  for him.  He did have a Holter that had recently shown that he does have  some atrial fib at times.  We know this.  His current medicines include  the approach that we need to take at this point.   PAST MEDICAL HISTORY:   ALLERGIES:  DILAUDID, SULFA, CODEINE, SHELLFISH, MORPHINE, OXYCONTIN,  and DEMEROL.  Also, the patient is not on aspirin, specifically because  of GI symptoms and cirrhotic liver disease.  Also, for the same reason,  he is not coumadinized.   OTHER MEDICAL PROBLEMS:  See the list below.   REVIEW OF SYSTEMS:  He feels well today.  His review of systems is  negative.   PHYSICAL EXAMINATION:  VITAL SIGNS:  Blood pressure is 140/56 with a  pulse of 70.  GENERAL:  The patient is oriented to person, time, and place.  Affect is  normal.  HEENT:  Reveals no xanthelasma.  He has normal extraocular motion.  NECK:  There are no carotid bruits.  There is no jugular venous  distention.  LUNGS:  Clear.  Respiratory effort is not labored.  CARDIAC:  Reveals an S1 with an S2.  There are no clicks or significant  murmurs.  ABDOMEN:  Protuberant, but stable.  EXTREMITIES:  He has slight peripheral edema in his right leg.   PROBLEMS INCLUDE:  1. Coronary artery disease post coronary artery bypass graft in 2004.      His last Myoview was done in October 2007, and it was stable.  He      does not need any further testing.  He is not on aspirin because of      his GI  symptomatology and liver disease.  2. History of good left ventricular function.  3. History of atrial fibrillation.  We had tried amiodarone in the      past, but he had multiple medical problems, and it was felt to      leave him off medicines for this.  Also, he cannot take Coumadin      because of his other problems.  4. History of hypertension, treated.  5. Gout, treated.  6. Diabetes, treated.  7. Severe liver disease, followed by [facility].  8. Significant alcohol history.  Over the years, we always tried to      have him not drink.  9. Sleep apnea, on CPAP.  10.Multiple drug allergies.  11.History of significant bacteremia with group B Streptococcus in the  leg in the past.  This was treated and fortunately stabilized.  12.Question of some carotid bruits in the past, but no major disease      by Doppler.  13.History of gallstones.  14.History of low platelets in the past.   Overall, he is stable.  No further cardiac workup is needed.     [name] Abed, MD, [facility]  Electronically Signed    JDK/MedQ  DD: 09/28/2007  DT: 09/28/2007  Job #: [id number]

## 2010-07-06 NOTE — Op Note (Signed)
NAMEMARTE, [name]                   ACCOUNT NO.:  [id number]   MEDICAL RECORD NO.:  [id number]          PATIENT TYPE:  AMB   LOCATION:  DSC                          FACILITY:  [facility]   PHYSICIAN:  Tennis Must Meyerdierks, M.D.DATE OF BIRTH:  December 30, 1935   DATE OF PROCEDURE:  04/11/2007  DATE OF DISCHARGE:                               OPERATIVE REPORT   PREOPERATIVE DIAGNOSIS:  Dupuytren's contractures, left long, ring and  small fingers.   POSTOPERATIVE DIAGNOSIS:  Dupuytren's contractures, left long, ring and  small fingers.   PROCEDURE:  Release of Dupuytren's contractures, left long ring and  small fingers.   SURGEON:  Lowell Bouton, M.D.   ANESTHESIA:  Axillary block.   OPERATIVE FINDINGS:  The patient had thickened palmar fascia in the palm  in line with the long finger and the small finger.  The fascia extended  out beyond the proximal interphalangeal joint in the ring finger.   PROCEDURE:  Under axillary block anesthesia with a tourniquet on the  left arm, the left hand was prepped and draped in usual fashion.  After  exsanguinating the limb, the tourniquet was inflated to 250 mmHg.  A  zigzag incision was made volarly on the ring finger first from the  proximal palm out to the middle phalanx.  The flaps were elevated and 4-  0 silk retention sutures were inserted.  Sharp elevation was performed  around the cord proximally in the palm and the cord was transected in  line with the ring finger.  It was then traced from proximal to distal  and care was taken to protect the neurovascular bundles.  The cord was  traced out to the MP joint and totally excised.  The flaps were then  further elevated distally and there was a cord that extended obliquely  to the PIP joint of the ring finger.  This was sharply dissected out  after protecting the neurovascular bundles.  Through the same incision  the skin edges were elevated and a second cord in the proximal palm to  the long finger was excised out to the MP level.  The cord to the small  finger required a separate incision in the palm and out across the MP  joint.  The cord was identified and neurovascular bundles were  protected.  The cord was sharply excised proximally and then traced  distally using an Allis clamp.  After completely excising the cord, the  fingers appeared to be straight.  The wounds were then irrigated  copiously with saline.  A vessel loop drain was left in for drainage.  The skin was closed with 4-0 nylon sutures.  Sterile dressings were  applied followed by a volar splint with the fingers extended.  The  tourniquet was released with good circulation of the hand.  The patient  went to the recovery room awake and stable in good condition.      Lowell Bouton, M.D.  Electronically Signed    EMM/MEDQ  D:  04/11/2007  T:  04/12/2007  Job:  [id number]   cc:  Darrick Penna. Linna Darner, MD,FACP,FCCP

## 2010-07-06 NOTE — Discharge Summary (Signed)
[name], [name]                   ACCOUNT NO.:  [id number]   MEDICAL RECORD NO.:  [id number]          PATIENT TYPE:  INP   LOCATION:  5525                         FACILITY:  [facility]   PHYSICIAN:  Georgina Quint. Plotnikov, MDDATE OF BIRTH:  [birth year]   DATE OF ADMISSION:  05/02/2008  DATE OF DISCHARGE:  05/04/2008                               DISCHARGE SUMMARY   FINAL DIAGNOSES:  1. Confusion likely due to elevated ammonia level.  2. Alcoholic liver cirrhosis.  3. Blood loss anemia.  4. Esophageal varices with chronic bleeding, status post banding.  5. Portal hypertension, status post transjugular intrahepatic      portosystemic shunt procedure.  6. Alcoholism, quit about 3 weeks ago, status post detoxification.  7. Thrombocytopenia due to liver disease.  8. Diabetes.  9. Hypertension.  10.Coronary artery disease.  11.Gout.  12.History of congestive heart failure.  13.Hepatic encephalopathy.   HISTORY:  The patient is a 75 year old male with multiple medical  problems with progressive mental state changes over past 5 days prior to  admission, much worse on the day of admission.  For the details, please  address to my history and physical on May 02, 2008.   HOSPITAL COURSE:  During the course of hospitalization, the patient was  found to have elevated ammonia of 79.  His original hemoglobin was 9.7,  the lowest 8.5, platelets 94.  His condition has gradually improved with  the help of lorazepam and higher dose of lactulose.  On the day of  discharge, his hemoglobin was 8.5.  In the view of his low platelet  count and guaiac-positive stool, I decided to transfuse him with 2 units  of red blood cells prior to discharge.  The family insisted on discharge  on May 04, 2008.   PHYSICAL EXAMINATION:  VITAL SIGNS:  On the day of discharge, his blood  pressure is 121/67, heart rate 67, respirations 19, temperature 98.5,  saturations 98% on room air, blood sugars 153 - 179 - 216.  GENERAL:  He is in no acute distress.  He is confused, not agitated.  HEENT:  Moist mucosa.  SKIN:  With aging changes.  LUNGS:  Clear.  HEART:  Regular.  ABDOMEN:  Soft and nontender.  No masses felt.  EXTREMITIES:  Lower extremities without edema.  Calves nontender.  NEUROLOGIC:  He is alert, disoriented to time and place.   LABORATORY DATA:  Hemoglobin 8.5 (was 9.7), white count 4.8, and  platelets 66.  Sodium 143, potassium 3.7, ammonia 54, B12 424.  Stool  heme positive.   Lab work as above.  His BNP was slightly elevated at 184.  Sed rate 47.  KUB without acute pathology.  Chest x-ray without acute changes.      Georgina Quint. Plotnikov, MD  Electronically Signed     AVP/MEDQ  D:  05/04/2008  T:  05/05/2008  Job:  [id number]

## 2010-07-06 NOTE — Assessment & Plan Note (Signed)
[facility]                                 ON-CALL NOTE   NAME:SHOEMiguel, [name]                          MRN:          [id number]  DATE:12/25/2006                            DOB:          1935-09-30    [name]'s wife, [name], called in, stating that he has been complaining  of substernal chest discomfort.  He has a history of a CABG several  years ago.  She reports that he states it is more congestion in his  chest due to a recent cold.  Nonetheless, his wife was concerned that  his chest discomfort could be heart-related.  She reports that he has  been having some chills.  He has a history of atrial fibrillation in the  past.   PLAN:  Advised the wife to have patient taken to the emergency  department by ambulance immediately.  She states that she will try to  convince her husband to go.  She reveals that he has been drinking  alcohol this evening and has had a little too much to drink.  Again, I  reiterated the importance of trying to get her husband to the emergency  department by EMS.  Wife agrees.     Leanne Chang, M.D.  Electronically Signed    LA/MedQ  DD: 12/25/2006  DT: 12/26/2006  Job #: [id number]

## 2010-07-06 NOTE — H&P (Signed)
NAMENOMAR, BROAD                   ACCOUNT NO.:  [id number]   MEDICAL RECORD NO.:  [id number]          PATIENT TYPE:  INP   LOCATION:  5525                         FACILITY:  [facility]   PHYSICIAN:  Georgina Quint. Plotnikov, MDDATE OF BIRTH:  01/31/1936   DATE OF ADMISSION:  05/02/2008  DATE OF DISCHARGE:                              HISTORY & PHYSICAL   CHIEF COMPLAINT:  Confused.   HISTORY OF PRESENT ILLNESS:  The patient is a 75 year old male with  multiple medical problems, who has been having progressive mental status  change over the past 5 days, worse tonight.  He was agitated the whole  night, turning mattress, climbing walls, not knowing who his wife was.  Does not remember anything.  This morning the wife brought him to the  emergency room.  He was found to have elevated ammonia.  The history was  provided by the wife.   PAST MEDICAL HISTORY:  1. Anemia.  2. Congestive heart failure.  3. Gout.  4. Coronary disease.  5. Depression.  6. Diabetes.  7. Dyslipidemia.  8. Hypertension.  9. Liver cirrhosis.  10.Alcoholism.  11.Tobacco abuse.  12.COPD of unknown degree.  13.Recent GI bleeding.  14.Esophageal varices.   SOCIAL HISTORY:  He quit drinking about 3 weeks ago.  He was detoxed.  He quit smoking at the same time.   FAMILY HISTORY:  Positive for diabetes.   MEDICATIONS:  Allopurinol; amitriptyline; diltiazem; isosorbide; Lantus;  Lasix; metformin; Zetia; omeprazole; amiodarone; Ferrex; propranolol;  vitamin C, doses unknown; it sounds like he has also been taking  lactulose 3 times a day.   ALLERGIES:  CODEINE, DEMEROL, DILAUDID, MORPHINE, OXYCONTIN, SHELL FISH,  AND SULFA.   SURGERIES:  Recent EGD with bleeding vein banding, status post TIPS  procedure.   REVIEW OF SYSTEMS:  Unable to obtain from the patient.   PHYSICAL EXAMINATION:  VITAL SIGNS:  Temperature 98.1, blood pressure  150/59, respirations 15, heart rate 77, and sat 99% on room air.  GENERAL:   The patient is in acute distress.  He is alert and oriented  x2.  HEENT:  Moist mucosa.  NECK:  Supple.  No meningeal signs.  LUNGS:  Clear.  No wheezes or rales.  HEART:  S1 and S2.  Distant tones.  No gallop.  ABDOMEN:  Soft and slightly distended.  No ascites.  Slight hepatomegaly  with splenomegaly.  No jaundice.  NEUROLOGIC:  Cranial nerves II through XII nonfocal.  Muscles strength  within normal limits.  No asterixis.  RECTAL:  Per emergency room physician.  Stool, black.   LABORATORY DATA:  Glucose 190.  White count 5.2, hemoglobin 9.7, MCV 93,  and platelets 94.  Sodium 140, potassium 3.5, glucose 197, creatinine  0.9, and AST 48.  Ammonia 79.  BNP 184.  Urinalysis normal.  Chest x-ray  without acute changes.  BMP pending.  KUB without acute pathology.   ASSESSMENT AND PLAN:  1. Confusion, likely due to elevated ammonia level/hepatic      encephalopathy.  2. Liver cirrhosis, likely end stage.  3. Esophageal varices, status  post recent bleeding, status post      banding.  4. Portal hypertension, status post transjugular intrahepatic      portosystemic shunt procedure.  5. Alcoholism.  He has been detoxed prior to this admission.  No      recurrent alcohol intake.  Thiamine 100 mg daily.  6. Anemia, on iron.  7. Low platelet count due to liver cirrhosis.  8. Other problems as per past medical history.      Georgina Quint. Plotnikov, MD  Electronically Signed     AVP/MEDQ  D:  05/02/2008  T:  05/03/2008  Job:  [id number]   cc:   Iva Boop, MD,FACG  Dr. Clearance Coots, primary care, [place]

## 2010-07-06 NOTE — Discharge Summary (Signed)
NAMEKASSIM, [name]                   ACCOUNT NO.:  [id number]   MEDICAL RECORD NO.:  [id number]          PATIENT TYPE:  INP   LOCATION:  5158                         FACILITY:  [facility]   PHYSICIAN:  Titus Dubin. Hopper, MD,FACP,FCCPDATE OF BIRTH:  [birth year]/38   DATE OF ADMISSION:  04/10/2008  DATE OF DISCHARGE:  04/16/2008                               DISCHARGE SUMMARY   ADMITTING DIAGNOSES:  1. Hematemesis.  2. Dyspnea, shortness of breath.  3. Esophageal varices with bleeding.  4. Diabetes, uncontrolled.   DISCHARGE DIAGNOSES:  1. Gastric variceal bleeding.  2. Acute blood loss anemia.  3. Laennec cirrhosis.  4. Thrombocytopenia.  5. Chronic alcoholism.   PROCEDURES:  TIPS on April 14, 2008, by Dr. Oley Balm,  Interventional Radiology.   OTHER PROCEDURE:  EGD with band ligation of varices by  Dr. Yancey Flemings.   BRIEF HISTORY:  Mr. Barbier presented with hematemesis x3 on April 09, 2008.  It has occurred in the context of eating a  meal.  He had seen  Dr. Leone Payor on April 08, 2008; strict alcohol abstinence was then  stressed.  Unfortunately, the patient continues to drink at least half a  pint per day.   He was also complaining of shortness of breath which is a chronic  problem over several months.   He appeared chronically ill in the office; resting pulse was 110, blood  pressure 166/72.  O2 sats were 99% on room air.  Exam revealed  tachycardia with flow murmur.  Chest was clear.  He had 1+ pedal edema  bilaterally.  He was admitted to the hospital and GI consulted.   As noted, he underwent EGD and band ligation of varices by Dr. Yancey Flemings  April 10, 2008.   He was found to have grade II varices in the distal esophagus which were  non-bleeding.  He had a varix in the cardia with massive hemorrhage.  Hemostasis was obtained with band ligation.  The exam was incomplete due  to massive amounts of clotted blood in the tube.   He was seen in consult by  Interventional Radiology and the TIPS  procedure was  completed  successfully on April 14, 2008.   His admission CBC revealed a hematocrit of 30.4 and platelet count of  66,000.  The PTT was within normal limits at 35.  PT was also normal at  1.3.  Random glucose on admission was 249, BUN was 68, creatinine 0.88.  SGOT was 45 with an SGPT of 43 in the context of known Laennec  cirrhosis.  Total bilirubin was 1.7 and indirect 1.4.  Serum ammonia was  elevated at 138 with normals less than 35.   Serial CBC's revealed hematocrit dropped as low as 23.8, prompting  transfusions.  Following transfusions, the hematocrit was 23.3.  The day  of discharge, his hemoglobin was 9.1, hematocrit 26.7.  The prior 2  hematocrits had been  25.3 and 24.2 respectively.  Platelet count was  54,000.  Fasting glucose was 189.   His platelet count had been as low as 46,000.  The transjugular intrahepatic portosystemic shunt was completed on  April 14, 2008, under real time ultrasound guidance.  The  portosystemic gradient decreased from 17 to 7 mmHg.  The portable chest  x-ray at that time did reveal left basilar airspace disease.   The day of discharge, the patient denied any chest pain or shortness of  breath.  He had been ambulating in the halls with minimal assistance.  He denied any cough or sputum or pleuritic pain.   He was afebrile.  Pulse was 75, respiratory rate 20, blood pressure  91/44.  It had ranged from this low to high of 149/74 over  the 36 hours  prior to discharge.  The repeat blood pressure on the morning of  discharge was 119/66.  O2 sats were 97% on room air.  Respiratory rate  was 20 and unlabored, pulse was 86.  He was afebrile.   He appeared weak, but in no acute distress.  Chest was clear with no  rales at the bases.  He had a grade 1 systolic murmur.  1+ pedal edema  was noted which was stable.  Abdomen was distended; bowel sounds were  hyperactive.   His anemia was  stable and clinically, there was no evidence of any  active cardiopulmonary process.   He was instructed to blow up 6 balloons a day to treat any residual  atelectasis.   A complex low glycemic index and load carb diet was recommended such as  The New Sugar Busters book.  He was to avoid dietary high fructose corn  syrup;especially if HFCS was listed as  #1, 2, or 3 on the label of the  food or drink.  He was also asked to restrict refined process carbs  which would have a  high glycemic index and load.   He was discharged on:  1. Metoprolol 25 mg 1/2 b.i.d.  2. Metformin 500 mg b.i.d. with the 2 largest meals.  3. Allopurinol 300 mg daily.  4. Isosorbide 60 one and half daily.  5. Diltiazem 180 mg daily.  6. Lasix 40 mg daily.  7. Omeprazole 20 mg daily.  8. Lantus 30 units at bedtime.  9. Vitamin C 500 mg daily.  10.Amitriptyline 10 mg at bedtime.  11.Vitamin D 2000 International Units daily.  These medicines had been      in place prior to admission.   New medications include:  1. Thiamine 100 mg daily.  2. Lactulose 15 mL b.i.d.  3. Reglan 5 mg every 6 hours.   His discharge status is improved.  Prognosis is guarded unless he makes  significant lifestyle changes, most importantly alcohol abstinence.  He  stated that he planned to become actively engaged in Alcoholics  [employer].   He will be seen in Interventional Radiology on April 22, 2008, [address] [facility] at 10:15 a.m.      Titus Dubin. Alwyn Ren, MD,FACP,FCCP  Electronically Signed     WFH/MEDQ  D:  04/16/2008  T:  04/16/2008  Job:  [id number]   cc:   Iva Boop, MD,FACG  D. Oley Balm III, M.D.

## 2010-07-06 NOTE — Assessment & Plan Note (Signed)
[facility]                            CARDIOLOGY OFFICE NOTE   NAME:Posthumus, LILLIE BOLLIG                          MRN:          [id number]  DATE:03/23/2007                            DOB:          09/01/1935    HISTORY:  Mr. Elk is here for cardiology followup.  He does have  cardiac disease, but in fact he has been stable.  I saw him last in  November 2007.  He had been at [facility] in October 2007 and had a  followup outpatient Myoview that showed good LV function and no  ischemia.  He is not having return of significant chest pain.  He has  ongoing GI workup and says he has liver disease.  He also needs surgery  for contracture of one of his fingers and this is to be done by [employer].  He will be cleared for this, see the end of the note  below.   ALLERGIES:  1. DILAUDID.  2. SULFA.  3. CODEINE.  4. SHELLFISH.  5. MORPHINE.  6. OXYCONTIN.  7. DEMEROL.   MEDICATIONS:  1. Allopurinol.  2. Isosorbide.  3. Cardizem.  4. Amitriptyline.  5. Lantus.  6. Metformin.  7. Furosemide.  8. Vitamins.  9. Omeprazole.  10.Meloxicam.  11.Metoprolol.   OTHER MEDICAL PROBLEMS:  See the list below.   REVIEW OF SYSTEMS:  Overall, he is feeling well.  He does not have any  significant complaints today.  His review of systems is negative.   PHYSICAL EXAMINATION:  GENERAL:  The patient is oriented to person, time  and place.  Affect is normal.  VITAL SIGNS:  Weight is 238 pounds, blood pressure is 150/72 with a  pulse 71.  HEENT:  Reveals no xanthelasma.  He has normal extraocular motion.  NECK:  There are no carotid bruits.  There is no jugular venous  distention.  LUNGS:  Clear.  Respiratory effort is not labored.  CARDIAC:  Reveals S1-S2.  There are no clicks or significant murmurs.  ABDOMEN:  Definitely protuberant and his liver is firm.  He has normal  bowel sounds.  EXTREMITIES:  He has mild chronic swelling of the right  lower  extremity that is old.  He has no edema in his left leg.   DIAGNOSTICS:  EKG today reveals sinus rhythm.   PROBLEMS:  1. Coronary disease post-coronary artery bypass graft in 2004.  Most      recent Myoview showed no ischemia and he does not need any further      testing at this time.  2. History of good left ventricular function.  3. History of atrial fibrillation.  We had tried amiodarone.  It was      then felt that with a multitude of medical problems, it would be      best to have him off medications.  In fact, he is in sinus rhythm      at this time.  4. History of hypertension, treated.  5. Gout, treated.  6. Diabetes, treated.  7. History of severe liver  disease followed by the [facility] group.  8. Significant alcohol history.  I do not know at this point if he is      dry or not.  9. Sleep apnea on continuous positive airway pressure.  10.Multiple drug allergies.  11.History of significant bacteremia with group B strep in his leg      that was treated in the past successfully.  12.Question of carotid bruits in the past, but no major disease by      Doppler.  13.History of gallstone.  14.History of low platelets in the past.   PLAN:  I am not changing any of his meds today.  I am not ordering any  studies.  I have carefully reviewed the situation as to whether or not  he can have surgery to his hand.  I believe certainly with using a local  block that it will be safe for his hand to be operated on.  He is  cleared from the cardiac viewpoint.     Luis Abed, MD, [facility]  Electronically Signed    JDK/MedQ  DD: 03/23/2007  DT: 03/23/2007  Job #: [id number]   cc:   Titus Dubin. Alwyn Ren, MD,FACP,FCCP  Iva Boop, MD,FACG  [facility]

## 2010-07-06 NOTE — Consult Note (Signed)
NEW PATIENT CONSULTATION   [name], [name]  DOB:  17-Feb-1936                                       02/16/2009  ZOXWR#:[id number]   The patient presents today for evaluation of bilateral lower extremity  swelling, right leg greater than the left.  He has a very complex  medical history.  He has had 4 separate episodes of right leg cellulitis  over the past several years, and we are seeing him for further  evaluation of his chronic swelling.  He does have a history of prior  vein harvest in his entire right leg for coronary artery bypass grafting  in 1993.  He had left leg vein harvest from his knee distally.  He does  have a history of insulin dependent diabetes with peripheral neuropathy,  history of hypertension, elevated cholesterol, and history of congestive  heart failure.  He also has a history of cirrhosis and underwent TIPS  procedure earlier in 2010.  He also has a history of right total knee  replacement.  He has chronic atrial fibrillation and also takes  spironolactone due to his liver failure for a diuretic.   FAMILY HISTORY:  Positive for premature atherosclerotic disease in his  mother, sister, and brother.   SOCIAL HISTORY:  He is married with 2 children.  He is a retired Psychologist, occupational.  He does not drink since February 2010.  He quit smoking 40 years ago.   REVIEW OF SYSTEMS:  CONSTITUTIONAL:  Multiply positive.  He has no  weight loss or weight gain.  His current weight is 224 pounds.  He is 6  feet 4 inches tall.  CARDIAC:  Positive for shortness of breath, palpitations, heart murmur,  atrial fibrillation.  PULMONARY:  Negative.  GASTROINTESTINAL:  Positive for cirrhosis and black stool with iron.  Does have constipation and diarrhea.  GENITOURINARY:  Positive for urinary frequency.  VASCULAR:  Leg pain with walking.  NEUROLOGIC:  Negative for stroke.  Did have a recent right eye embolus  which had a negative workup by carotid duplex.  MUSCULOSKELETAL:  Positive for muscle joint pain and arthritis.  PSYCHIATRIC:  Negative.  HEMATOLOGIC:  Anemia, bleeding problems related to his variceal  hemorrhage.  SKIN:  Noted for rosacea.   PHYSICAL EXAMINATION:  Well-developed, well-nourished white male  appearing stated age.  Blood pressure is 133/68, pulse 67, temperature  97.6.  HEENT, shows rosacea on his nose.  He does have pupils equal,  round, and reactive to light.  He sclerae normal.  Neck, his carotid  arteries without bruits bilaterally.  He has no lymphadenopathy and no  JVD.  Chest is clear bilaterally without wheezes.  Cardiovascular, his  heart is irregular with no murmurs present.  Abdomen is soft and  nontender.  I cannot appreciate ascites.  He does have 2+ femoral  pulses.  Musculoskeletal shows a scar from prior total knee replacement  and does have some swelling bilaterally, more so on the right from below  the knee distally.  This is mild pitting edema currently.  Neurologic,  no focal weakness or paresthesias.  Skin shows no ulcer or open rashes.  He does have palpable popliteal and posterior tibial pulses bilaterally.   I have reviewed his records by Dr. Alwyn Ren, he had responded well to his  cellulitis with antibiotics in the past.  I had  a long discussion with  the patient and his family.  I explained that he does, in all  likelihood, have chronic venous insufficiency.  He does do a very good  job of elevation and also does a good job of wearing knee-high  compression garments.  I explained that there would not be any other  options for treatment since he has had a right superficial system  removed for coronary artery bypass grafting nearly 20 years ago.  I  explained that, with normal arterial flow, that this would be very  unlikely to have limb-threatening issue with the cellulitis, but in all  likelihood will have recurrent episodes that will require recurrent  treatment with antibiotics.  He  understands and will see [place] again on an  as needed basis.     Larina Earthly, M.D.  Electronically Signed   TFE/MEDQ  D:  02/16/2009  T:  02/17/2009  Job:  [id number]   cc:   Titus Dubin. Alwyn Ren, MD,FACP,FCCP  Iva Boop, MD,FACG  Luis Abed, MD, [facility]

## 2010-07-09 NOTE — Assessment & Plan Note (Signed)
[facility]                              CARDIOLOGY OFFICE NOTE   NAME:[name], [name]                          MRN:          [id number]  DATE:12/26/2005                            DOB:          [birth year]    Mr. Vonruden had some chest and arm discomfort and was admitted, MI was ruled  out at [facility] on November 28, 2005.  He then had a follow up outpatient  Myoview scan.  This showed good LV function and no ischemia.  He has had no  return on his chest pain.  He does have some mild exertional shortness of  breath.  He mentioned that he has had some swelling, more in the right leg  than the left leg.  He elevated his foot and it improved.  He is on a  diuretic. He has not had any syncope or presyncope.   PAST MEDICAL HISTORY:   ALLERGIES:  DILAUDID, SULFA, CODEINE, SHELL FISH, MORPHINE, OXYCONTIN and  DEMEROL.  He has had intolerance to many of these.   CURRENT MEDICATIONS:  1. Allopurinol 300.  2. Isosorbide  90.  3. Cardizem 180.  4. Amitriptyline 10.  5. Lantus as directed.  6. Metformin 500 b.i.d.  7. Furosemide 40.  8. Propranolol 20 b.i.d.  9. Vitamin C.  10.Zetia.  11.AcipHex.  12.Multivitamin.   OTHER MEDICAL PROBLEMS:  See the complete list on my note of Jul 08, 2005.   REVIEW OF SYSTEMS:  He is feeling well.  He mentions his mild edema.  Otherwise his review of systems is negative. He has followed up with  Dr.  Jarold Motto. Otherwise his review of systems is negative.   PHYSICAL EXAMINATION:  Patient is well-nourished and well-developed.  Weight today is 237 pounds with blood pressure 110/68 and his pulse is 64.  Patient is oriented to person, time and place.  His affect is normal.  HEENT:  Reveals no xanthelasma.  He has normal extraocular motion.  There  are no carotid bruits.  There is no jugular venous distention.  LUNGS:  Are clear. Respiratory effort is not labored.  CARDIAC:  Reveals an S1 with an S2.  There are no clicks or  significant  murmurs.  ABDOMEN:  Is soft today.  I did not fully examine his liver.  He has trace peripheral edema.  There are no major musculoskeletal  deformities.   Problems are listed completely on my note of Jul 08, 2005.  Coronary artery  disease post coronary artery bypass grafting in 2004.  Recent chest  discomfort and a Myoview revealing no significant ischemia.   Cardiac status is stable.  I will see him for cardiology follow up in 6  months.    ______________________________  Luis Abed, MD, [facility]    JDK/MedQ  DD: 12/26/2005  DT: 12/26/2005  Job #: [id number]

## 2010-07-09 NOTE — H&P (Signed)
[name], [name]                   ACCOUNT NO.:  [id number]   MEDICAL RECORD NO.:  [id number]          PATIENT TYPE:  EMS   LOCATION:  MAJO                         FACILITY:  [facility]   PHYSICIAN:  Thomas C. Wall, MD, FACCDATE OF BIRTH:  1935/07/15   DATE OF ADMISSION:  11/28/2005  DATE OF DISCHARGE:                                HISTORY & PHYSICAL   CHIEF COMPLAINT:  Left arm aching the past three nights, Friday, Saturday  and Sunday, similar to what I had when I had my bypass in 1993.   HISTORY OF PRESENT ILLNESS:  This is a 75 year old married white male who  developed some left forearm sharp pains lasting about 30 minutes on Friday  evening about 8:30.  He was watching TV.  The same thing happened again on  Saturday night, the same thing again last night for about an hour and a  half.  He took 2 Darvocet last evening which helped.   This morning, he had about a 1/10 discomfort.  He called the office, and  they told him to come to the emergency room.   He has had no pain or discomfort since about 10:30 this morning.   The electrocardiogram here shows no acute changes and no changes since 2006.   PAST MEDICAL HISTORY:  1. Coronary artery disease, status post coronary artery bypass grafting in      19 93.  His last stress Myoview was September 04, 2002.  EF 64% with no      ischemia.  2. History of atrial fibrillation with amiodarone being discontinued in      May of 2007 secondary to eye issues.  3. Hypertension.  4. History of gout.  5. Type II diabetes.   PAST SURGICAL HISTORY:  1. He has had a T&A.  2. He has had back surgery.  3. Obstructive sleep apnea on CPAP.  4. Chronic thrombocytopenia.  5. Bilateral carotid bruits with negative Doppler in 2004.  6. History of some liver disease from alcohol.  7. Esophageal varices with dilatation.  Portal hypertension and varices.      Last ultrasound October 13, 2005.   ALLERGIES:  1. CODEINE.  2. SULFA.  3. DEMEROL.  4.  DILAUDID.  5. MORPHINE SULFATE.  6. OXYCODONE.  7. SHELLFISH.  8. DYE.   MEDICATIONS:  1. Allopurinol 20 mg daily.  2. Amitriptyline 10 mg daily.  3. Darvocet-N 100 p.r.n.  4. Diltiazem 180 mg daily.  5. Imdur 90 mg q.a.m.  6. Lantus 18 units q.h.s.  7. Lasix 40 mg daily.  8. Metformin 500 mg p.o. b.i.d.  9. Propranolol 20 mg p.o. b.i.d.  10.Zetia 10 mg p.o. daily.  11.AcipHex 20 mg daily.   SOCIAL HISTORY:  Lives in [place] with his wife.  He has not smoked since  1968.  He drinks about a pint of alcohol a day.   FAMILY HISTORY:  Remarkable for coronary disease in siblings and diabetes.   REVIEW OF SYSTEMS:  He has had about a 30 pound weight change over the past  3 years.  He is very unable to get around and do much because of his back  and general medical issues.  HEENT:  Significant for nasal discharge and  occasional headache.  CARDIOPULMONARY:  He has chronic dyspnea on exertion,  right lower extremity edema that goes down at night.  MUSCULOSKELETAL:  He  has a lot of arthralgias in his hands, back and knees.  GI:  Chronic  abdominal pain.   PHYSICAL EXAMINATION:  GENERAL:  He is in no acute distress.  He is very  pleasant.  He is chronically ill appearing.  VITAL SIGNS:  Blood pressure 125/67, pulse 63 and regular, temperature 98.7,  respiratory rate 20, saturations 98% on 2 liters nasal cannula.  HEENT:  He is normocephalic and atraumatic.  Sclerae are injected.  Pupils  equal, round and reactive to light.  Extraocular movements intact.  Facial  symmetry is normal.  Dentition is satisfactory.  NECK:  Supple.  There are bilateral carotid systolic sounds.  There is no  JVD.  Thyroid is not enlarged.  LUNGS:  Clear.  HEART:  Soft S1 and S2.  Soft systolic murmur.  ABDOMEN:  Soft with good bowel sounds with no tenderness, no hepatomegaly.  EXTREMITIES:  2+ edema on the right, 1+ on the left.  He is status post  saphenous vein graft harvest sites.  His pulses are  intact.  No evidence of  DVT.  NEUROLOGIC:  Intact.   CHEST X-RAY:  No acute cardiopulmonary disease with mild cardiomegaly.   ELECTROCARDIOGRAM:  Diffuse J-point elevation with LVH.  No significant  change since 2006.   LABORATORY DATA:  Hemoglobin is 10.4.  Creatinine is 1.1, potassium 3.8.  LFT's are normal.  D-dimers are 0.25.  Point of care markers are negative  x2.   ASSESSMENT AND PLAN:  1. Atypical discomfort involving his left forearm with no change in his      EKG and negative point of care markers x2.  I suspect this is non-      cardiac, but will watch him carefully with his multiple risk factors      and old grafts being bypassed in 1993.  2. Other problems as listed above, which are extensive.  3. With his dye allergy and history of esophageal varices, we would prefer      not to do a catheterization.  I have discussed this with the patient      and his wife.   PLAN:  Check serial cardiac enzymes.  If these are negative, we will arrange  for an adenosine Myoview in the office with careful followup with Dr. Myrtis Ser.      Thomas C. Daleen Squibb, MD, [facility]  Electronically Signed     TCW/MEDQ  D:  11/28/2005  T:  11/30/2005  Job:  [id number]   cc:   Luis Abed, MD, [facility]  Titus Dubin. Alwyn Ren, MD,FACP,FCCP  Vania Rea. Jarold Motto, MD, [facility], [place]

## 2010-07-09 NOTE — Op Note (Signed)
[facility]  Patient:    CHANZ, CAHALL                      MRN: [id number] Proc. Date: 07/28/99 Adm. Date:  [id number] Attending:  Montez Morita, Philips John                           Operative Report  PREOPERATIVE DIAGNOSIS:  Degenerative arthritis of the right knee.  POSTOPERATIVE DIAGNOSIS:  Degenerative arthritis of the right knee.  OPERATIVE PROCEDURE:  Right total knee arthroplasty using an [employer] system with a posterior cruciate-sparing Scorpio, size 11 femur, 11 tibia, with a 30 mm recessed patellar component, a 12 mm tibial bearing insert.  SURGEON:  Philips J. Montez Morita, M.D.  ASSISTANT:  Illene Labrador. [place], M.D.  DESCRIPTION OF PROCEDURE:  After suitable spinal anesthesia, the knee was prepped and draped routinely and after exsanguination, an upper thigh tourniquet was inserted to 375 mmHg.  The tourniquet was up just a little less than two hours total.  The standard approach was made, midline incision, medial parapatellar incision, and releasing of the medial collateral ligament in the upper part of the pes anserinus on the medial side.  Anterior halves of the menisci were removed.  Degenerative changes particularly on the medial femoral condyle and in the patellofemoral area were noted.  A drill hole was made with a centering hole in the femur, after which 10 mm was resected from the distal femur with a small flexion contracture noted.  A sizing device determined at 11 was the size, and the cutting device was then used to cut for the 11 mm femur.  We then debrided the remaining menisci and the anterior cruciate.  The PCL looked quite thick and good, and we elected to try to retain it.  Tibial spines were removed.  The tibia was brought forward.  A size 11 tray was drilled and 8 mm of bone was then elected to be taken off the normal lateral side.  Using a 5 degree slope, a cut was then made using the tibial jig.  A slot was then cut in the  femur for the Scorpio patellar groove, and an 11 femoral trial was inserted and an 11 tibia.  A trial reduction revealed a 10 was comfortable, but nothing vigorous.  I elected to take two more off the tibia so we could get at least 12 mm of plastic, and an additional 2 mm cut was made off the tibia, and the 12 mm spacer worked just fine with an 11 tray.  At that point, we elected to go ahead and ream the patella, reaming it to a size 30 for a recessed patella, cutting in the side holes.  At this point, the area was water-picked, the back of the femur having been cleared of all debris using the laminar spreader, and the PCL appeared to be quite thick and good, and we elected to retain it.  Cement mixing and then the cementing with the tibia first, femur second, patella last.  After the cement had hardened, we removed the tibial tray, checked the back of the area. It looked good.  The final tray was inserted.  There was excellent ligamentous stability on both sides and a nice range of motion.  A little subluxation of the patella that warranted a lateral release until a no-thumbs technique could be utilized.  Hemovac was left in the knee, one in the  subcutaneous.  Routine wound closure, pressure dressing, tourniquet let down just a little bit before two hours.  Goes to recovery in good condition.  Marcaine 0.5% 20 cc with adrenalin were injected into the knee prior to closure. DD:  07/28/99 TD:  07/31/99 Job: [id number]

## 2010-07-09 NOTE — Discharge Summary (Signed)
[name], [name]                   ACCOUNT NO.:  [id number]   MEDICAL RECORD NO.:  [id number]          PATIENT TYPE:  INP   LOCATION:  2010                         FACILITY:  [facility]   PHYSICIAN:  Titus Dubin. Alwyn Ren, M.D. [facility] OF BIRTH:  [birth year]   DATE OF ADMISSION:  11/18/2004  DATE OF DISCHARGE:  11/21/2004                                 DISCHARGE SUMMARY   ADMITTING DIAGNOSES:  1.  Chest pain suggestive of angina.  2.  Anemia in the context of alcohol abuse.   DISCHARGE DIAGNOSES:  1.  Anemia related to small bowel bleeding most likely.  2.  Findings of alcoholic gastritis on esophageal varices, neither of which      were bleeding at endoscopy.   BRIEF HISTORY:  He presented November 18, 2004, with abdominal pain and  chest tightness.  He had been seen in the emergency room on November 14, 2004, and found to have a hematocrit of 26.2.  The hematocrit had been 39.3  in June of 2006.  He was placed on omeprazole.  He did discontinue his iron  in an attempt to See if I were still bleeding.  He had been having melena.   In the office, his EKG revealed progressive nonspecific T changes inferior  laterally.   He was admitted with cardiac enzymes and serial H&Hs.   At the time of admission his hemoglobin was 6.9 and hematocrit 20.3;  following transfusions, the hematocrit was 27.9 and hemoglobin 9.6.  His  melena cleared and stool became more solid.  Platelet count ranged from  65,000 to 77,000.  His thrombocytopenia is felt to be related to alcohol  use.  By history, he had been drinking a pint a day until November 12, 2004.   Total protein was 5.4 and albumin 3.1.  Glucose was 122.  AST was 42 with a  normal ALT.  Hemoglobin A1c was 5.9, indicating an average blood sugar of  131 and an elevated cardiovascular risk of approximately 18%.  Lipid profile  revealed a total cholesterol of 174, triglycerides 141, HDL of 32 and LDL of  114.  This was on Zetia alone.  He had  been intolerant to the statins in the  past as a concern was his liver disease.   He was seen in consultation by Dr. Lina Sar who performed endoscopy with  the findings above.  She also performed a CEA which was normal at less than  0.5.   He was monitored clinically and with H&Hs.  On the day of discharge, blood  pressure was 140/77, O2 sats were 99% on room air, pulse was 62 and  respiratory rate 18.  The abdomen was soft; he had a flow murmur.   Hemoglobin and hematocrit were 9 and 26.3 respectively.  He was felt to be  clinically stable.  He was discharged on his home medicines with the  exception of discontinuation of all Arthrotec and nonsteroidals.  He was to  take his iron 150 mg twice a day with Vitamin C.  He will have an  encapsulated camera study of the small bowel arranged by Dr. Norval Gable  group and follow with Dr. Jarold Motto.  He will be seen by me following  completion of the GI evaluation.   Alcohol abstinence was stressed again.  Involvement in AA was also  discussed.  A frank discussion was held concerning his prognosis in view of  the multisystem disease and the risk of continued drinking.   DIET:  Will be no added salt, low carb.   DISCHARGE STATUS:  Is improved.   PROGNOSIS:  Depends on his compliance with these measures.      Titus Dubin. Alwyn Ren, M.D. [facility]  Electronically Signed     WFH/MEDQ  D:  11/21/2004  T:  11/21/2004  Job:  [id number]   cc:   Vania Rea. Jarold Motto, M.D. [facility]  [address]  [place]  [place] [address]

## 2010-07-09 NOTE — Op Note (Signed)
[name], [name]                   ACCOUNT NO.:  [id number]   MEDICAL RECORD NO.:  [id number]          PATIENT TYPE:  OIB   LOCATION:  2899                         FACILITY:  [facility]   PHYSICIAN:  Leonides Grills, M.D.     DATE OF BIRTH:  [birth year]   DATE OF PROCEDURE:  04/23/2004  DATE OF DISCHARGE:                                 OPERATIVE REPORT   PREOPERATIVE DIAGNOSIS:  Right tarsal tunnel syndrome.   POSTOPERATIVE DIAGNOSIS:  Right tarsal tunnel syndrome.   OPERATION:  Right tarsal tunnel release.   ANESTHESIA:  General endotracheal tube anesthesia.   SURGEON:  Leonides Grills, M.D.   ASSISTANT:  Lianne Cure, P.A.C.   ESTIMATED BLOOD LOSS:  Minimal.   TOURNIQUET TIME:  Approximately 20 minutes.   COMPLICATIONS:  None.   DISPOSITION:  Stable to PR.   INDICATIONS FOR PROCEDURE:  This is a 75 year old male with longstanding  burning foot pain that was consistent with tarsal tunnel syndrome.  He was  consented for the above procedure.  All risks include infection,  neurovascular injury, prolonged recovery, persistent pain, worse pain were  all explained.  Questions were encouraged and answered.   DESCRIPTION OF PROCEDURE:  The patient was brought to the operating room,  placed in the supine position and after adequate general endotracheal tube  anesthesia  was administered as well as Ancef 1 g IV piggyback, a bump was  then placed under the contralateral hip.  The right lower extremity was then  prepped and draped in sterile manner over a proximally placed thigh  tourniquet.  Limb was gravity exsanguinated and tourniquet was elevated to  290 mmHg.  A curvilinear incision was made midline on the posterior medial  aspect of the right ankle.  Dissection was carried down through the skin.  Hemostasis was obtained.  Retinaculum over neurovascular structures was then  opened and meticulously dissected out.  Nerves were freed up as well.  Once  this was done, tourniquet  weakness  deflated.  Hemostasis was obtained.  There was no pulsatile bleeding.  The  area was copiously irrigated with normal saline.  Subcu was closed with 3-0  Vicryl, skin was closed with 4-0 nylon.  Sterile dressing was applied.  CamWalker boot was applied.  Patient stable to PR.      PB/MEDQ  D:  04/23/2004  T:  04/23/2004  Job:  [id number]

## 2010-07-09 NOTE — Procedures (Signed)
[facility]                            ABDOMINAL ULTRASOUND REPORT   NAME:Yusupov, [name]                          MRN:          [id number]  DATE:10/13/2005                            DOB:          Mar 18, 1935    ACCESSION NUMBER:  [id number]   READING PHYSICIAN: Vania Rea. Jarold Motto, MD, [facility], FACP   PROCEDURE:  Multiplanar abdominal ultrasound imaging was performed in the  upright, supine, right and left lateral decubitus positions.   RESULTS:  Abdominal aorta normal, maximal diameter 2.0 cm.  The IVC is patent.   The pancreas appears normal throughout the head, body and tail without  evidence of ductal dilatation, pancreatic masses, or peripancreatic  inflammation.   Gallbladder is normal.  However, the walls are slightly thickened measuring  6.4 mm in spots.  No pericholecystic fluid or intraluminal echogenic foci to  suggest gallstone disease.   The common bile duct measures 3.7 mm in maximal diameter without evidence of  intraluminal foci.   The liver appears normal without evidence of parenchymal lesion, ductal  dilatation or vascular abnormality.   Kidneys are normal in appearance, right 91.1 cm, left 10.4 cm.   Spleen is normal in size, measuring 11.5 cm without parenchymal lesion.   ASSESSMENT:  This ultrasound exam shows no evidence of cholelithiasis noted  on previous exams.  There is also no evidence of biliary ductal dilatation.  The gallbladder walls are slightly thickened, however.  This patient has  known alcoholic liver disease, but his liver generally appears normal as  does the pancreas on this exam.                                   Vania Rea. Jarold Motto, MD, [facility], [place]   DRP/MedQ  DD:  10/13/2005  DT:  10/13/2005  Job #:  [id number]

## 2010-07-09 NOTE — Discharge Summary (Signed)
[facility]  Patient:    [name], [name]                      MRN: [id number] Adm. Date:  [id number] Disc. Date: [id number] Attending:  Montez Morita, Philips John Dictator:   Grayland Jack, P.A.                           Discharge Summary  ADMISSION DIAGNOSES: 1. Bilateral knee osteoarthritis, right more than left. 2. History of atrial fibrillation. 3. Hypertension. 4. Gout. 5. Diabetes mellitus. 6. 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 7. Coronary artery disease. 8. Hyperlipidemia.  DISCHARGE DIAGNOSES: 1. Status post right total knee replacement. 2. Status post respiratory depression, resolved. 3. History of atrial fibrillation. 4. Hypertension. 5. Gout. 6. Diabetes mellitus. 7. 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 8. Coronary artery disease. 9. Hyperlipidemia.  CONSULTATIONS: 1. Internal medicine, Dr. Posey Rea. 2. Cardiology. 3. Pulmonology.  PROCEDURE:  Right total knee arthroplasty utilizing Osteonic system with posterior cruciate sparing Scorpio system.  Surgeon:  Debria Garret, M.D. Assistant:  Marlowe Kays, M.D.  BRIEF HISTORY:  [name] is a 75 year old male with long-term history of bilateral knee pain.  This has been chronic and progressive in nature and has begun to interfere with his activities of daily living.  He has failed all conservative treatments and x-rays showed degenerative changes with spurring and sclerosis.  MRI revealed patellofemoral degenerative changes with grade IV chondromalacia and medial compartment degenerative changes.  It was felt the patient would best benefit from surgical intervention at this time.  The risk, benefits and complications of the surgery were discussed with the patient in detail and he agreed to proceed.  HOSPITAL COURSE:  The patient was admitted to [facility] and underwent the above stated procedure on July 28, 1999.  He tolerated  the procedure well without complications.  The patient was placed on telemetry postoperatively and had some shortness of breath postoperatively. Dr. Posey Rea was called for a consult to assist with the patients medical issues.  All medical issues were treated by Dr. Posey Rea during this hospital stay.  Cardiology was consulted postoperatively due to the patients history of coronary artery disease, status post CABG and his acute onset of shortness of breath.  On July 30, 1999, the patient became very lethargic and somnolent. He was on the sat machine and when the patient feel asleep his saturations were dropped into the 60s.  While he was awake and talking they would return in the 90s.  The patient was given 0.2 mg of Narcan which greatly increased his oxygen saturation and his cognition.  The patient was taken off all narcotic medications and placed on Darvocet for pain.  Apparently, the patient had a reaction to Darvocet and had nightmares that evening.  He became very lethargic and the nurses were concerned.  The patient was subsequently transferred to CCU.  On July 31, 1999, the patient was much better and he was awake, alert and oriented.  He was nondiaphragic at this time.  It was felt the patient was stable for transfer to the orthopedic floor.  The patient was weaned off his O2 and BiPAP and became 98% oxygen saturations on room air. The patient progressed nicely with physical therapy in just a short few days. On August 02, 1999, the patient was awake, alert and oriented.  He denied chest pain, knee pain, shortness of breath.  On examination he is afebrile and his vital signs are stable.  CBC was 199.  His PT was 16.8 and his INR was 1.7. His dressing was dry and the incision was clean and dry with no erythema or discharge.  He was neurovascularly intact in the right lower extremity.  He had approximately 60% degrees of active knee flexion.  His calf was soft and nontender.  It is felt  the patient was stable for discharge at this time.  LABORATORY DATA:  Urinalysis preoperatively was within normal limits.  A followup urinalysis on July 31, 1999, had some protein but was otherwise within normal limits.  Routine chemistries preop were within normal limits with a elevated glucose at 146.  The patients chemistries remained within normal limits during the hospital course with glucose rising to a high of 172 and was 161 prior to discharge.  PT and INR preoperatively, PT was 14.9 and INR was 1.3.  The patient was on Coumadin therapy per pharmacy protocol for DVT prophylaxis and these values were followed by pharmacy during his hospital stay.  Prior to discharge, the patients PT was 16.8 and INR was 1.7.  CBC preop was within normal limits.  Hemoglobin was 12.4 and hematocrit was 37.5. Postoperatively, the patients hemoglobin fell only to a low of 9.3 and hematocrit to 27.0.  Radiology:  Preoperative x-rays of the right knee showed degenerative changes, most notably in the medial compartment.  Preoperative chest x-ray showed mild COPD.  Followup chest x-ray on June 7 showed no active cardiopulmonary processes.  A CT of the chest on June 7 showed no evidence of acute pulmonary embolus or other active disease.  Cardiology:  Preoperative ECG showed normal sinus rhythm.  Preoperative Cardiolite study showed a mildly abnormal stress Cardiolite studies with a small defect at the apex with a low risk for noncardiac surgery.  CONDITION ON DISCHARGE:  Improved and stable.  DISCHARGE PLANS:  The patient is being discharged to home.  He will require home health physical therapy for knee range of motion and ambulation.  He will require home health nursing for pro times while on Coumadin.  He is to remain weightbearing as tolerated.  He is to keep the incision site clean and dry. He may shower but needs to replace the dressing after showering.  He is to resume home  medications.  DISCHARGE MEDICATIONS: 1. Skelaxin 400 mg one or two tabs p.o. q.6-8h. as needed for spasms.  2. For pain the patient is only to take a Tylenol, but no narcotics for    this patient.  FOLLOWUP:  The patient is to follow up with Dr. Quintella Reichert in 7-10 days for his diabetes.  He is to return to the clinic in 10 days to follow up with Dr. Montez Morita for staple removal, and please call [phone] for appointment. DD: 08/02/99 TD:  08/07/99 Job: [id number]

## 2010-07-09 NOTE — Assessment & Plan Note (Signed)
[facility]                           GASTROENTEROLOGY OFFICE NOTE   NAME:[name]                          MRN:          [id number]  DATE:11/03/2005                            DOB:          [birth year]    [name] returns today, and really has no specific complaints of except for  intermittent left lower quadrant crampy pain.  I had placed him on Actigall,  which he could not tolerate because of diarrhea, nausea and vomiting.  He  otherwise is on all the medications as previously outlined.   LAB DATA:  Showed a normal alpha fetoprotein marker of 2.5 ng%.  The normal  ammonia level is 17.  His hemoglobin was 10.4 with normochromic indices,  white count 8100, and platelet count of 124,000.  Liver function tests were  normal, except for an SGPT of 44.  His serum albumin was normal at 4.0 grams  percent.  Amylase, lipase, folate and B12 levels were normal.   [name] continues to use coke and bourbon rather regularly.  He has not  attended [employer] as I have requested.  He denies systemic complaints or  other areas of abdominal pain, nausea or vomiting at this time.   PHYSICAL EXAMINATION:  He weighs 236 pounds, and blood pressure is 138/62.  Pulse was 64 and regular.  ABDOMEN:  Showed a ventral hernia, but no definite organomegaly, masses,  tenderness or ascites.  Actually there was mild tenderness to deep palpation  of the left lower quadrant.  Bowel sounds were normal.   ASSESSMENT:  [name] has alcoholic liver disease, which really appears to be  fairly well compensated, although we do know that he has portal hypertension  and varices, per his previous endoscopies.  He is on chronic Inderal  therapy.  He has no evidence of encephalopathy nor ascites at this time.   RECOMMENDATIONS:  I have advised [name] to totally avoid alcohol, and I have  reiterated this to him, and I have actually given him the number for AA to  get a copy of their meetings.  He is  not interested in inpatient or  outpatient rehabilitation efforts otherwise.  I did send him by the lab to  check a carbohydrate deficient transferrin level, which if elevated may give  him some incentive to cut back on his drinking.  I have advised him again  that this is the only thing that I really have to offer at this time to  protect him from further liver damage.  I think his other  medications, as listed in his chart and reviewed, seem to be at adequate  levels at this time.  I will continue to see him at monthly intervals or  p.r.n. as needed, along with Dr. Alwyn [name].                                   [name] Rea. Jarold Motto, MD, Clementeen Graham, [place]   DRP/MedQ  DD:  11/03/2005  DT:  11/04/2005  Job #:  [id number]  cc:   [name], M.D.  [name] Dubin. Alwyn Ren, MD,FACP,FCCP

## 2010-07-09 NOTE — Consult Note (Signed)
[name], [name]                   ACCOUNT NO.:  [id number]   MEDICAL RECORD NO.:  [id number]          PATIENT TYPE:  INP   LOCATION:  3732                         FACILITY:  [facility]   PHYSICIAN:  Iva Boop, MD,FACGDATE OF BIRTH:  [birth year]   DATE OF CONSULTATION:  DATE OF DISCHARGE:                                 CONSULTATION   REQUESTING PHYSICIAN:  Titus Dubin. Alwyn Ren, MD   REASON FOR CONSULTATION:  Anemia, history of liver disease and varices.   ASSESSMENT:  1. Chronic recurrent anemia.  This time he is mildly microcytic, which      suggests possible iron deficiency.  Extensive evaluations in he      past including colonoscopy (2005), recurrent EGDs, have revealed      only small varices, probable portal gastropathy, hiatal hernia, and      he does have Barrett's esophagus.  He had a capsule endoscopy of      the small bowel in 2006 when he had problems with profound anemia,      and that was unrevealing.  Possibilities at this time include      chronic occult blood loss, though he is Hemoccult negative.  Bone      marrow suppression, chronic disease, anemia, or perhaps all of the      above.  He does not give a history of any recent melena or      hematochezia.  2. Alcoholic liver disease with presumed cirrhosis.  3. Right greater than left lower extremity edema being worked up for      possible deep venous thrombosis   PLAN AND RECOMMENDATIONS:  1. I think it is appropriate to repeat an upper endoscopy to make sure      he has no obvious changes to suggest bleeding from his known upper      gastrointestinal pathology.  2. Pending that, an iron infusion may be appropriate.  3. He says he wants to quit alcohol now, which is a good sign.  4. Further plans pending clinical course.  I have explained the risks,      benefits and indications of upper endoscopy.  He understands and      agrees to proceed.   HISTORY:  As above.  This 75 year old white man has a long history  of  alcohol use and has been drinking a fair amount of bourbon and coke  lately though off and on.  His hemoglobin was found to be 7.6 and also  in the 6 range recently.  His LFTs are normal actually.  He has not seen  any blood.  He has some vague chest pain complaints, mainly when he  drinks alcohol.  He has had some indigestion today.  He had a stress  test because of chest pain a few months ago that was normal.  Repeat  hemoglobin after the hemoglobin of 7.6 was 6.7.  He has been transfused  2 units of packed red cells.  Hemoglobin is up to 8.6.  He feels a  little bit better.  He had been weak and short  of breath somewhat.  There is no dysphagia that I can sort out, no abdominal pain,  no change  in bowel habits.   PAST MEDICAL HISTORY:  1. Alcoholic liver disease with cirrhosis.  2. History of thrombocytopenia.  3. History of recurrent anemia with etiology not entirely clear.  I      think he probably did respond to iron after the anemia in 2006.  He      had a small bowel capsule endoscopy with a small polyp in the small      intestine and phlebectasia, which is really a normal finding.      Colonoscopy has demonstrated colon polyps and diverticulosis and      hemorrhoids/rectal varices in 2005.  EGD last performed in March      2007 demonstrated suspected Barrett's esophagus (not biopsied due      to varices), grade 1 varices, portal gastropathy, a 4 cm hiatal      hernia with some inflammatory changes at the diaphragmatic      impingement area.  4. Multiple back surgeries with lumbar laminectomy in 1975, 1977,      1978, as well as a lumbar spinal fusion in 1980 with persistent      chronic low back pain.  5. Ulnar nerve decompression in 1989.  6. Hemorrhoidectomy in 1982.  7. Inguinal hernia repair in 1996.  8. Arthroscopy 1999.  9. Dupuytren's contracture removal on the hand in 1997.  10.Right total knee replacement, 2001.  11.Sleep apnea.  12.Diabetes mellitus.   13.Obesity.  14.Hypertension.  15.Dyslipidemia.  16.Gout.  17.Note, the patient also does have coronary artery disease.  He had      coronary bypass grafting in 2004.   MEDICATIONS:  1. Allopurinol 300 mg daily.  2. Imdur 90 mg daily.  3. Cardizem 180 mg daily.  4. Lantus insulin 20 units at bedtime.  5. Amitriptyline 10 mg q.h.s.  6. Metformin 500 mg b.i.d.  7. Furosemide 40 mg daily.  8. Zetia  10 mg daily.  9. Protonix 40 mg daily (AcipHex at home).   DRUG ALLERGIES:  CODEINE, SULFA, DILAUDID.  He also has sensitivities or  allergies to CODEINE, SHELLFISH, MORPHINE, OXYCONTIN and DEMEROL.   SOCIAL HISTORY:  He is married.  He is a retired Psychologist, occupational for [employer].  He likes to fish.  He does not smoke.  Alcohol:  Up to a  pint of whiskey a day.  He is married, lives with his wife.   FAMILY HISTORY:  Noncontributory at this point.   REVIEW OF SYSTEMS:  He has felt somewhat weak.  Occasional diarrhea;  nothing chronic, though.  He uses CPAP.  He has recently had some  headaches.  He steadfastly denies any NSAIDs or salicylates.  He does  not have obvious dysphagia but if he eats a real big bite, it may move  slowly.   All other systems are negative.   PHYSICAL EXAMINATION:  GENERAL:  An obese, pleasant white man in no  acute distress.  He is somewhat pale.  The eyes are anicteric.  VITAL SIGNS:  Temperature 97.2, pulse 58, respirations 18, blood  pressure is 108/50.  His blood glucose was 140 and then 225 this  morning.  The pulse standing was 64, blood pressure 130/65 sitting,  120/63 with lying down, and the numbers as above.  HEENT:  Nose consistent with rhinophyma and rosacea changes.  Mouth:  Posterior pharynx free of lesions.  NECK:  Supple without mass, thyromegaly.  CHEST:  Clear.  HEART:  S1, S2.  I hear no rubs, murmurs or gallops.  CHEST:  There is no gynecomastia, minimal if any spider angiomata. ABDOMEN:  A ventral hernia, soft, nontender, no  organomegaly or mass.  He is obese.  He could have ascites.  I do not feel the liver or the  spleen, as noted.  EXTREMITIES:  There is 2+ edema in the right lower extremity 1+ edema in  the left lower extremity.  They are warm and dry, as was the rest of the  skin.  SKIN:  There is no acute rash on the skin that I can see changes.  Changes in the nose as mentioned above with the rhinophyma and rosacea  and comedones.  LYMPH NODES:  No neck, supraclavicular or inguinal adenopathy palpated.  NEUROLOGIC:  Mental status:  He is alert and oriented x3.  Speech is  clear.  Follows commands well.  Neuro is grossly nonfocal.   LABORATORY DATA:  As mentioned above regarding the hemoglobin.  His MCV  was 76.  Other recent labs from the 16th when he was seen in the office  with Dr. Alwyn Ren:  He has had negative cardiac enzymes.  PSA was 3.15.  His hemoglobin A1c was 7.4%.  His liver chemistries are normal.  Potassium 4.2.   Creatinine 1.1.   I appreciate the opportunity to care for this patient.      Iva Boop, MD,FACG  Electronically Signed     CEG/MEDQ  D:  03/10/2006  T:  03/10/2006  Job:  [id number]   cc:   Titus Dubin. Alwyn Ren, MD,FACP,FCCP  Vania Rea. Jarold Motto, MD, Caleen Essex, FAGA

## 2010-07-09 NOTE — Consult Note (Signed)
NAMEHODGE, STACHNIK                   ACCOUNT NO.:  [id number]   MEDICAL RECORD NO.:  [id number]          PATIENT TYPE:  INP   LOCATION:  3732                         FACILITY:  [facility]   PHYSICIAN:  Bevelyn Buckles. Bensimhon, MDDATE OF BIRTH:  1935/08/31   DATE OF CONSULTATION:  03/11/2006  DATE OF DISCHARGE:                                 CONSULTATION   REASON FOR CONSULTATION:  Chest pain and ventricular tachycardia.   HISTORY OF PRESENT ILLNESS:  [name] is a 75 year old male with a  history of coronary artery disease status post CABG in 1993, paroxysmal  atrial fibrillation, diabetes and cirrhosis with ongoing alcohol use and  varices.  He was admitted for chest pain in October of 2007 and ruled  out for myocardial infarction with serial cardiac markers.  Had a  followup outpatient Myoview at that time which showed normal LV function  with no ischemia.  He was admitted on March 09, 2006 with symptomatic  anemia with chest pain with exertion, as well as eating and drinking.  Also noted shortness of breath.  He says he can only walk about 100 feet  before he gives out, due to fatigue, shortness of breath, some chest  discomfort and claudication.  He underwent an EGD by Dr. Leone Payor which  showed a question of GAVE syndrome, as well as 3 esophageal varices  without bleeding.  There was also evidence of Barrett's esophagus.   Last night the nursing staff woke him from sleep, as there was the  question of a prolonged, many minutes' episode of a wide-complex  tachycardia on the monitor.  He was totally asymptomatic when they awoke  him.  However, subsequent to this he did develop some chest pain  radiating to his arm.  He was given nitroglycerin without significant  relief.  The pain finally resolved.  During that episode an EKG was  obtained which showed normal sinus rhythm with LVH and repolarization  changes.  Cardiac markers were checked; his troponin was 0.02 with MB of  1.2.   This  morning he says he does feel better.  He does continue to have  occasional chest pressure without any significant pattern.   REVIEW OF SYSTEMS:  He denies fevers, chills, orthopnea or PND.  He has  had some lower extremity swelling, right greater than left.  Ultrasound  in the hospital showed no evidence of DVT.  He does have arthritis pain.  No productive cough.  No focal neurologic symptoms.  No hematemesis or  bright red blood per rectum.  The remainder of the review of systems is  are per HPI and problem list.  Otherwise, all systems are negative.   PROBLEM LIST:  1. Coronary artery disease, status post coronary artery bypass graft      in 1993.  Significant for chest pain in October of 2007; ruled out      for myocardial infarction, Myoview showed a normal ejection      fraction without any ischemia.  2. Cirrhosis with ongoing alcohol abuse.  3. Paroxysmal atrial fibrillation, previously on amiodarone which  was      discontinued secondary to eye issues.  4. Hypertension.  5. Gout.  6. Diabetes.  7. Obstructive sleep apnea, on CPAP.  8. Bilateral carotid bruits with negative Doppler in 2004.   CURRENT MEDICATIONS:  Allopurinol, Imdur 90 a day, Diltiazem 180 a day,  insulin, Elavil 10 q.h.s., metformin 500 b.i.d., Lasix 40 a day, Zetia  10 a day, and pantoprazole 40 a day.   ALLERGIES:  Codeine, sulfa, Demerol, Dilaudid, morphine, oxycodone,  shellfish and IV contrast dye.   SOCIAL HISTORY:  Lives in [place] with his wife.  He has not smoked since  1968.  He drinks on a daily basis.  He is retired.   FAMILY HISTORY:  Remarkable for coronary artery disease in his siblings.   PHYSICAL EXAMINATION:  GENERAL:  He is sitting in a chair.  He is  chronically ill-appearing but in no acute distress.  VITAL SIGNS:  Blood pressure is 137/71, heart rate is 65, temperature is  99.1 and he is sating at 96% on room air.  HEENT:  Sclerae anicteric.  EOMI.  There is no xanthelasma.  Mucous   membranes are moist.  Oropharynx is clear.  NECK:  Supple.  No obvious JVD.  Carotids are 2+ bilaterally.  There are  soft bilateral bruits, right greater than left.  There is no  lymphadenopathy or thyromegaly.  CARDIAC:  Regular rate and rhythm.  No murmurs, rubs or gallops  appreciated.  LUNGS:  Clear.  ABDOMEN:  Soft, nontender and nondistended.  No hepatosplenomegaly.  No  bruits.  No masses.  EXTREMITIES:  Show 1+ edema, right greater than left.  He is status post  saphenous vein graft harvesting.  There are no cords.  No clubbing or  cyanosis.  NEUROLOGIC:  Alert and oriented times three.  Cranial nerves II through  XII are intact.  Moves all four extremities without difficulty.   LABORATORY DATA:  Hemoglobin is 8.3 and hematocrit is 26.2.   EKG DATA:  EKG shows sinus rhythm with LVH and repolarization  abnormalities; no old EKG's available.   ASSESSMENT:  1. Prolonged wide complex tachycardia on monitor.  This is artifact.      EKG at the same time confirms normal sinus rhythm.  2. Chronic chest pain with typical and atypical features.  Recent      Myoview negative for ischemia, ruled out for myocardial infarction.  3. Coronary artery disease status post coronary artery bypass graft in      1993.  4. Cirrhosis with varices and ongoing alcohol use.  5. Anemia.   DISCUSSION/PLAN:  I am uncertain if his chest pain is cardiac or not.  He has had a prolonged episode of chest pain, now with negative troponin  which makes it less likely to be cardiac, however, not impossible.  Given his  varices and chronic anemia, he is a fairly poor candidate for  percutaneous intervention, given his need for anticoagulation.  For now,  will add a beta-blocker and increase his proton pump inhibitor.  Will  defer the decision regarding cath to Dr. Myrtis Ser, who knows him well.      Bevelyn Buckles. Bensimhon, MD Electronically Signed     DRB/MEDQ  D:  03/11/2006  T:  03/11/2006  Job:  [id number]    cc:   Titus Dubin. Alwyn Ren, MD,FACP,FCCP  Luis Abed, MD, [facility]

## 2010-07-09 NOTE — Assessment & Plan Note (Signed)
[facility]                           GASTROENTEROLOGY OFFICE NOTE   NAME:[name] [name] STAUP                          MRN:          [id number]  DATE:10/06/2005                            DOB:          [birth year]    HISTORY OF PRESENT ILLNESS:  The patient is an elderly white male with  multiple cardiovascular problems, who has had previous bypass surgery but  has good left ventricular function.  He has a history of previous atrial  fibrillation that has been treated with amiodarone by Dr. Myrtis [name] for a long  period of time, history of hypertension, gouty arthritis, and diabetes along  with sleep apnea.  I have been seeing him because of severe alcoholism with  associated alcoholic liver disease and cirrhosis manifested by recurrent  anemia, known varices, and portal hypertension.  The patient has continued  to use alcohol despite multiple attempts to get him to stop.  He has refused  to go to alcoholics anonymous or receive inpatient counseling or  rehabilitation.  The patient does have known gallstones, has seen Dr. Ezzard [name]  in the past, and is not felt to be a surgical candidate because of his  multiple medical problems mentioned above.   He comes to the office today saying that he had an episode of subxiphoid  pain, rather severe, several days ago radiating to his back with some nausea  but no vomiting.  He has chronic acid reflux for which he takes daily  Aciphex 20 mg.  He denies current reflux symptoms or dysphagia.  He has had  no clay-colored stools, darkening, icterus, fever, or chills with his pain,  which has resolved.  His other complaints are one of increased abdominal  girth and vague abdominal discomfort.  He denies fever or chills.  He is  followed closely by Dr. Alwyn [name] and Dr. Myrtis [name].   MEDICATIONS:  He is on multiple medications.  The list is reviewed in his  chart and includes:  1. Metformin 500 mg twice daily. .  2. Insulin.  3. Lasix 40  mg daily.  4. Inderal 20 mg twice daily.  5. Allopurinol 300 mg daily.  6. Isosorbide 30 mg daily.  7. Diltiazem 180 mg daily.  8. Amiodarone 100 mg daily.  9. Zetia 10 mg daily,  10.Lyrica 75 mg daily.  11.Minocin 50 mg daily for rhinophyma.  12.Darvocet p.r.n.  13.Flexeril.   PERTINENT LABORATORY AND X-RAY DATA:  Recent lab data by Dr. Alwyn [name] on 8/06  showed a macrocytic anemia with a hemoglobin of 10.4 and MCV of 101.  Platelet count was 118,000.  White count was 12,900.  Liver function tests  remained elevated with an SGOT of 60, SGPT 55, but no other liver tests  listed.  The hemoglobin A1c was 6.1.  The TSH was 2.33.   PHYSICAL EXAMINATION:  GENERAL:  Exam today shows a slightly pale-appearing  white male with a rather obvious rhinophyma.  His mental status is clear,  and I could not appreciate asterixis.  VITAL SIGNS:  All of his vital signs were normal with blood pressure  122/76,  pulse 64 and regular.  His weight was up 4 pounds to 233.4 pounds.  LUNGS:  Generally clear.  HEART:  I could not appreciate significant murmurs, gallops, or rubs.  He  has an umbilical hernia with a rather obtuse abdomen.  I could not  appreciate any definite organomegaly, but I do think he has some mild  ascites.  There were no localized areas of tenderness.  EXTREMITIES:  He has +1 peripheral edema with some pitting, but no evidence  of acute phlebitis.  RECTAL:  Exam deferred.   ASSESSMENT:  1. Alcoholic cirrhosis with continued alcohol use and a picture consistent      with alcoholic hepatitis.  2. Portal hypertension with known varices treated with Inderal therapy.      He has never had a known upper gastrointestinal bleed.  3. Mild pancytopenia related to congestive splenomegaly.  4. Recent episode of chest pain, probably related to symptomatic      cholelithiasis.  As mentioned above, he is not felt to be a surgical      candidate.  5. Continued ethanol abuse.  The patient is not  interested in Antabuse      prescription.  6. Multiple cardiovascular issues and  ischemic heart disease with      previous bypass surgery.  7. Gouty arthritis on allopurinol.  8. Chronic rhinophyma with Minocin therapy.  9. History of sleep apnea with CPAP machine.  10.Adult-onset diabetes, which is insulin-dependent.   RECOMMENDATIONS:  1. Repeat lab data including repeat liver profile, amylase, and lipase.      Repeat hemoglobin along with B12 and folate levels.  I think most of      his macrocytosis, however, is from ethanol abuse.  2. Repeat upper abdominal ultrasound exam to see if he has ascites.  3. Trial of Urso 300 mg twice daily to see if we can hopefully improve his      liver disease and make at least an attempt to dissolve his gallstones      because of his poor surgical eligibility.  4. Continue reflux regimen.  I have given him plenty of Aciphex samples      today.  5. The patient has been URGED to go to an AA meeting and to start      attending these regularly.  I think the chance of this is fairly slim,      however.  6. Other multiple medical medications per Dr. Alwyn [name] and Dr. Myrtis [name].  I will      see him back in several weeks' time for GI followup.  This patient, if      he has ascites, will probably need to be      on chronic Xifaxan therapy.  However, I think that expense with      medication is a big problem, and I am not sure if he will comply with      any of the above suggestions.                                   [name] Rea. Jarold Motto, MD, [name] [name], [place]   DRP/MedQ  DD:  10/06/2005  DT:  10/06/2005  Job #:  [id number]   cc:   [name] [name]. [name] Ren, MD, FACP, [facility]

## 2010-07-09 NOTE — Letter (Signed)
September 28, 2005     Vania Rea. Jarold Motto, MD, FACG, FACP  [address]  [place], [place] [address]   RE:  [name], [name]  MRN:  [id number]  /  DOB:  08-03-1935   Dear [name]:   I saw [name] September 26, 2005, with generalized malaise in the context of  probable heat prostration.  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  After returning to the house and napping, [name] awoke with cold sweats  and chest pain.  [name] attributed the chest pains to an aggravation of [name]  gallstones from eating peanuts that day.  Since that time, [name] has had  malaise and constipation.  [name] has been using a citrate product.  Since  September 22, 2005, [name] has also had some head congestion; [name] is chronically  short of breath and has had no significant change in these symptoms.   [name] diabetes did flare following epidural injection on September 12, 2005, but  review of [name] records indicate that control has improved.   [name] has not had any chest pain since September 22, 2005, or September 23, 2005.   [name] admits to drinking a little while at the beach.  [name] stated don't tell  [name].  [name] wife is very conscientious in helping him manage [name] health  issues and this probably explains why [name] goes to the beach for respite from  her supervision.   [name] was afebrile and had gained 2 pounds to 232.  [name] rhythm was regular  despite being off the amiodarone.  [name] did have stasis changes over [name] shins  with some hyperpigmentation.  [name] has a massive ventral hernia. Abdomen was  nontender.   [name] hematocrit has dropped from 39 in February of this year to 31.6.  [name]  also had an elevated white count of 12,900 with a slight left shift.  As  noted, [name] was afebrile.  I will follow up on the possibility of any signs of  infection.   [name] SGOT and SGPT have risen from 36 and 40 to 60 and 55.   [name] A1c has dropped from 7.2 in February to 6.1 indicating dramatic  improvement  in [name] diabetic control.  This would suggest that [name] has curbed  [name] alcohol intake to some extent.  [name] also has sliding scale that [name]  employs.   TSH was therapeutic.   [name] found varices and alcoholic gastritis in September 2006 and this is the  most likely etiology of [name] anemia.  [name] also found colon polyp and  diverticulosis in 2005.   I know that [name] have attempted multiple times to help [name] change [name]  destructive behavior, but would [name]  be kind enough to see him again.  I  will ask him to collect stool cards and recheck [name] hematocrit. [name] did not  exhibit any hypotension (blood pressure 120/70) and [name] did not exhibit any  renal dysfunction from [name] heat exposure.  Specifically, [name] BUN was 16,  creatinine 1.1 and potassium 4.4.   To facilitate continuity of care, a copy of this will be sent to Dr. Jerral Bonito as well.  [name] will be asked to see Trey Paula if there is recurrence of the  chest pain.    Sincerely,      Titus Dubin. Alwyn Ren, MD, [facility]   WFH/MedQ  DD:  09/28/2005  DT:  09/28/2005  Job #:  [id number]   CC:    Luis Abed, MD, [facility]

## 2010-07-09 NOTE — H&P (Signed)
[name], [name]                   ACCOUNT NO.:  [id number]   MEDICAL RECORD NO.:  [id number]          PATIENT TYPE:  INP   LOCATION:  2010                         FACILITY:  [facility]   PHYSICIAN:  Titus Dubin. Alwyn Ren, M.D. [facility] OF BIRTH:  31-May-1935   DATE OF ADMISSION:  11/18/2004  DATE OF DISCHARGE:                                HISTORY & PHYSICAL   [name] is an unfortunate, 75 year old, white male who has multi-system  disease complicated by non-compliance with medical recommendations.  He  presents with abdominal pain and chest tightness with anemia.  Clinically,  angina related to drop in blood count is suggested in the setting of known  esophageal reflux.   He was seen at the emergency room, November 14, 2004, for epigastric pain.  At that time his hematocrit was 26.2.  In June 2006, it was 39.3.  He was  placed on omeprazole 20 mg daily.  Because of the cost of $87 for 30 pills,  he chose to take Prilosec over the counter which was only $28.  The symptoms  have persisted and now have been associated with chest tightness.  He failed  to mention this during the ER visit.  He has also had some difficulty  swallowing.  He has been seen by Dr. Sheryn Bison and had a documented  hiatal hernia for which he has had surgery.   Stools have been tarry even after stopping iron on November 14, 2004.  In  the emergency room, his lipase and ammonia levels were normal.  Potassium  was 3.  He has been on Furosemide.  Glucose was 192, BUN 49.  SGOT 37 and  SGPT 42.  He states that he has stopped drinking as of November 12, 2004.  He had been drinking a pint per day.   He is under increased stress related to having arrived at the donut hole  of Medicare Part D.   His present symptoms have also been associated with some shortness of breath  and coating of the tongue.  He has also had some loss of taste.   PAST MEDICAL HISTORY:  1.  Septicemia in 2004.  2.  He has had tarsal tunnel  release.  3.  He has also had retinal tears.  4.  Colonoscopy has revealed polyps and diverticulosis.  5.  He has had total knee replacement on the right.  6.  Remotely he has had tonsillectomy.  7.  In 1993, he had six-vessel bypass.   MEDICAL PROBLEMS:  1.  Diabetes.  2.  Hypertension.  3.  Dyslipidemia.  4.  Metabolic syndrome.  5.  Degenerative joint disease.  6.  Sleep apnea.  7.  He has had thrombocytopenia in the setting of alcohol induced liver      disease.   FAMILY HISTORY:  Includes cancer in his father of unknown primary.  A  brother had cancer of the lung, was a smoker.  Another brother had melanoma.  Two brothers have diabetes.  Two brothers have hypertension.  Two brothers  have had myocardial infarction.  He quit smoking in 1968.  He states he was drinking a pint per day.   INTOLERANCE/ALLERGIES:  1.  DILAUDID.  2.  SULFA.  3.  MORPHINE.  4.  CODEINE.  5.  SHELLFISH.  6.  OXYCONTIN.   He has been on:  1.  Isosorbide mono 60 mg, one and a half tablets daily.  2.  Allopurinol 300 mg daily.  3.  Iron 150 mg daily, until the date noted above.  4.  Diltiazem 180 daily.  5.  Furosemide 40 mg daily.  6.  Amiodarone 200 mg, one half daily.  7.  Metformin 500 mg b.i.d.  8.  Lantus 15 units at bedtime.  9.  Propranolol 20 mg b.i.d.  10. Amitriptyline 10 mg at bedtime.  11. Zetia 10 mg daily.  12. Arthrotek 50 mg twice a day.   REVIEW OF SYSTEMS:  As outlined above.  He was most recently seen in June  2006.  At that time platelet count was 71,000.  This was felt to be related  to his alcohol use as per Dr. Otilio Connors assessment.  He was found to  have dyslipidemia.  The non-absorbed agent Welchol was recommended because  of the dyslipidemia but he stated that this had been initiated in the past.  Involvement in AA was recommended at that time but he has not been acceptant  of this.   PHYSICAL EXAMINATION:  GENERAL:  He appears somewhat chronically  ill.  VITAL SIGNS:  Weight is up 4 pounds to 241, pulse 60 and regular,  respiratory rate 15 to 18, blood pressure 110/60.  HEENT:  There is distortion of the nose due to scarring.  Fundi reveal  arteriolar  narrowing.  He has no lymphadenopathy about the head, neck, or  axilla.  There is erythema of the oropharynx and nares.  LUNGS:  Breath sounds are decreased.  HEART:  He has a grade 1 to 1.5 systolic murmur.  ABDOMEN:  Nontender but there is dullness in the right upper quadrant to  percussion.  A large ventral hernia is noted and the abdomen is doughy.  EXTREMITIES:  Crepitus is noted in the knees.  There are no musculoskeletal  deficits.  His judgment and insight are questionable at best in view of his  noncompliance with the medical recommendations, despite his advanced multi-  system disease.   O2 sats were 96 to 97% on room air.  EKG revealed progressive nonspecific T  changes inferior laterally.   1.  He was admitted to telemetry with cardiac enzymes.  2.  Hematocrit will be monitored and he will be transfused as dictated by      the degree of anemia.  3.  Gastroenterology will be consulted if he has progressive anemia.  4.  If cardiac enzymes are elevated, then cardiology consultation will be      pursued.      Titus Dubin. Alwyn Ren, M.D. [facility]  Electronically Signed     WFH/MEDQ  D:  11/19/2004  T:  11/19/2004  Job:  [id number]

## 2010-07-09 NOTE — Consult Note (Signed)
[name], [name]                         ACCOUNT NO.:  [id number]   MEDICAL RECORD NO.:  [id number]                   PATIENT TYPE:  INP   LOCATION:  2017                                 FACILITY:  [facility]   PHYSICIAN:  Rollene Rotunda, M.D.                DATE OF BIRTH:  30-May-1935   DATE OF CONSULTATION:  07/30/2002  DATE OF DISCHARGE:                                   CONSULTATION   PRIMARY CARE PHYSICIAN:  [name], M.D. [facility]   REASON FOR PRESENTATION:  Patient with chest pain and coronary disease.   HISTORY OF PRESENT ILLNESS:  The patient is a pleasant 75 year old gentleman  [place]  Three days prior to this transfer,  he was admitted to the [facility] for evaluation of chest discomfort  and chills.  He felt a tightness.  He points to his upper epigastric area.  It was unlike previous angina.  It seemed to be constant.  There was no  radiation to his arm.  There was no neck discomfort.  He did have chills and  felt poorly all over.  He was not describing nausea or vomiting.  It was  thought he might have a pneumonia.  He apparently ruled out for myocardial  infarction with negative enzymes.  He was noted to be anemic with renal  insufficiency.  Subsequently, he was found to have bacteremia with group B  Streptococcus.  He also developed erythema of his right leg, consistent with  a cellulitis.  At [facility], he was treated with Demerol for his  chest pain.  He developed bradycardia and hypotension necessitating external  pacing.  He was subsequently transferred to [facility], where he  has been managed with antibiotics.  He has had an evaluation of his renal  insufficiency with negative renal ultrasound.  He did have an enlarged  spleen.  He is now transferred for further care to the primary care service.  He continues to complain of a low-grade chest discomfort that he describes  as a fullness.   PAST  MEDICAL HISTORY:  1. Coronary artery disease, status post coronary artery bypass graft     (catheterization May 27, 1998 with three-vessel native coronary disease,     saphenous vein graft to a marginal and PDA was patent, saphenous vein     graft to the OM1 was occluded to the OM1 but a distal anastomosis was     patent to a distal OM.  A saphenous vein graft to diagonal was patent.  A     LIMA to the LAD was patent.  He had a well-preserved ejection fraction.     He has had a Cardiolite in 2002 which was negative for any evidence of     ischemia).  2. Paroxysmal atrial fibrillation.  3. Hypertension.  4. Gout.  5. Diabetes mellitus.  6. Previous  ETOH use.  7. Obstructive sleep apnea.  8. Dysphagia.  9. Cholestatic hepatitis.   PAST SURGICAL HISTORY:  1. CABG in 1993.  2. Total knee replacement.   ALLERGIES:  1. DILAUDID.  2. SOMA.  3. CODEINE.  4. SHELLFISH.  5. MORPHINE.  6. OXYCONTIN.  7. DEMEROL.   MEDICATIONS:  (Prior to admission)  1. Insulin.  2. Folic acid.  3. Amitriptyline.  4. Vioxx.  5. Lasix.  6. Glipizide.  7. Aciphex.  8. Allopurinol.  9. Amiodarone 100 mg daily.  10.      Diltiazem.  11.      Isordil.  12.      Aspirin.  13.      Metformin.   SOCIAL HISTORY:  The patient lives in [place] with his wife.  He is an ex-  smoker.  He is a recovering alcoholic.   FAMILY HISTORY:  Noncontributory for early coronary artery disease.   REVIEW OF SYSTEMS:  Positive for dark stools, gallstones with possible  surgery in his future, nonproductive cough, decreasing exercise tolerance,  increasing dyspnea with exertion and fatigue slowly over weeks.  Otherwise,  as stated in the HPI.   PHYSICAL EXAMINATION:  GENERAL:  The patient is in no distress.  VITAL SIGNS:  Temperature 99.1, blood pressure 132/82, heart rate 16 and  regular.  HEENT:  Eyes unremarkable; pupils equal, round, and reactive to light; fundi  visualized, oral mucosa unremarkable;  dentition reasonable.  NECK:  No jugular venous distention, wave form within normal limits, carotid  upstroke brisk and symmetric, no bruits or thyromegaly.  LYMPHATICS:  No cervical, axillary, or inguinal adenopathy.  LUNGS:  Upper airway wheezes.  No crackles, no rhonchi.  BACK:  No costovertebral angle tenderness.  CHEST:  Well-healed sternotomy scar.  HEART:  PMI displaced with sustained, S1 and S2 within normal limits.  No  S3, no S4, no murmurs.  ABDOMEN:  Obese, positive bowel sounds.  Normal in frequency and pitch.  No  bruits, rebound, guarding.  Midline pulse.  No hepatomegaly or splenomegaly.  SKIN:  No rashes, no nodules.  EXTREMITIES:  2+ pulses upper, 2+ dorsalis pedis bilaterally, right  significant lower extremity edema above the knee, left mild lower extremity  edema, well-healed right saphenous vein graft harvest site, erythema over  the entire anterior tibial area from the dorsum of his foot to his knee with  patchy erythema in the upper thigh.  NEUROLOGIC:  Oriented to person, place, and time.  Cranial nerves II-XII  grossly intact, motor grossly intact.   LABORATORY DATA:  WBC 13.4, hemoglobin 9.2, platelets 84,000.  Sodium 136,  potassium 4.6, BUN 49, creatinine 2.9.  Troponin 0.04.   Chest x-ray:  Cardiomegaly, bilateral basilar atelectasis versus infiltrate.   EKG:  Sinus rhythm, rate 60, axis within normal limits, intervals within  normal limits, early transition to lead V2, probably lead placement, no  acute ST wave changes.   ASSESSMENT AND PLAN:  1. Chest pain.  The patient's chest pain is somewhat atypical.  It is mostly     an upper epigastric discomfort.  It is not similar to his previous     angina.  It has been somewhat constant.  There has been no objective     evidence of ischemia.  He has had fatigue and shortness of breath which    has been slowly progressive.  This could be an anginal equivalent.  At     this point, he will be managed for  his  cellulitis.  Once he has recovered     from this, we can consider Cardiolite.  This had been planned in the     office preoperatively.  2. Bacteremia.  The patient has bacteremia with obvious cellulitis.  He has     no objective evidence of endocarditis.  We will get an echocardiogram.     Management will be per the primary care team.  3. Renal insufficiency.  The patient has had a renal ultrasound and a     urinalysis which have not been revealing of an etiology for his renal     insufficiency.  He may have some prerenal insufficiency on top of     intrinsic diabetes-induced kidney disease.  4. Anemia per primary care.  5. Cellulitis per primary care.  6. Risk reduction.  I do not see that the patient is on a statin, which we     could consider.  7. Diabetes; have his metformin held with his renal insufficiency.  8. Atrial fibrillation.  The patient will continue on Pacerone.                                               Rollene Rotunda, M.D.    JH/MEDQ  D:  07/30/2002  T:  07/31/2002  Job:  [id number]

## 2010-07-09 NOTE — H&P (Signed)
[name], [name]                   ACCOUNT NO.:  [id number]   MEDICAL RECORD NO.:  [id number]          PATIENT TYPE:  INP   LOCATION:  3732                         FACILITY:  [facility]   PHYSICIAN:  Titus Dubin. Hopper, MD,FACP,FCCPDATE OF BIRTH:  October 02[birth year] 1937   DATE OF ADMISSION:  03/08/2006  DATE OF DISCHARGE:                              HISTORY & PHYSICAL   This is a re-dictation of the History and Physical.   HISTORY OF PRESENT ILLNESS:  [name] presented to the office on  March 08, 2006 complaining of swelling in the right leg from the knee  down for several months.  This has been progressive after he quit  wearing his external compression stockings.   Additionally, had at least 2 months of chest pain.  He states that it is  the same chest pain as he was having at the time of a nuclear test  stress, December 05, 2005, which revealed no ischemia.   He describes exertional dyspnea and also pain in both popliteal spaces  after walking  100 feet or climbing 12 stairs.   The chest pain is described as epigastric and dull and occurring daily  since October of 2007.  It does resolve at night.   The leg discomfort with exertion typically occurs at the same level  repetitively.   EKG revealed no acute process, but his hemoglobin was 7.6.  In August of  2007 it had been 10.4.  Arrangements were attempted for outpatient  transfusions on January 17, but in-patient care was required because of  his comorbidities of chest pain and shortness of breath.Hgb recheck  prior to transfusion revealed a value of 6.7 suggesting active blood  loss rather than a chronic stable process.   PAST MEDICAL HISTORY:  Is long and complicated.  In 1993 he had 6  bypasses.  He has had a right total knee replacement.  He has had  tonsillectomy.  He has had hiatal hernia surgery in 1968.  He was  admitted in 2004 with cellulitis as well as anemia.  At that time he had  positive fecal occult blood testing.  In  2005, colonoscopy revealed  polyps and diverticulosis.  He had tarsal tunnel release by Dr. Lestine Box.  He has had retinal tears on the left.  Endoscopy in 2006 revealed  varices and alcohol-induced gastritis.  He was last hospitalized October  8 to October 9 with chest pain.   MEDICAL PROBLEMS:  Include:  1. Decreased testosterone levels.  2. Diabetes.  3. Hypertension.  4. Dyslipidemia.  5. Metabolic syndrome.  6. Esophageal reflux.  7. Osteoarthritis.  8. Sleep apnea.   FAMILY HISTORY:  Includes cancer in his father of unknown primary.  A  brother had cancer of the lung and was a smoker.  One brother had  melanoma.  Two brothers had diabetes, two brothers had hypertension, and  one has had a pacemaker.  Two brothers had myocardial infarctions and  died from the same.   He quit smoking in 1968.  He typically has been drinking up to a fifth a  day.  He is very evasive about his alcohol consumption at this time,  stating it is some;and as alcohol use was discussed, he asked What  about the Zetia?  This is in reference to possible decreased efficacy  in reducing plaque size in  patients with familial hypercholesterolemia  in a recently published study.   He is on allopurinol 300 mg daily, isosorbide mononitrate 60 mg 1-1/2  tablet daily, diltiazem 180 mg daily, amitriptyline 10 mg daily at  bedtime, Lantus 18 units at bedtime, metformin 500 mg twice a day,  furosemide 40 mg daily, propranolol 20 mg twice a day, Zetia 10 mg  daily, AcipHex 20 mg before breakfast.  He takes Darvocet N-100 one to  two every 4-6 hours as needed for pain and Flexeril 5 mg 3 times a day.  He is on a sliding scale of Regular insulin as well.   He is intolerant or ALLERGIC to CODEINE, SULFA, DILAUDID, MORPHINE,  OXYCODONE, SHELLFISH, DEMEROL, and IV CONTRAST.   REVIEW OF SYSTEMS:  Reveal headache during the same period as the chest  pain & shortness of breath.   He has described malaise, particularly  after heat exposure in the summer  months.  He has had no signs of upper respiratory tract infection such  as purulence, facial pain, or fever.   He denies melena or rectal bleeding, stating he has been observing his  stools.   He has severe back pain and made the comment I need another epidural.   He states that his fasting glucose has been running 122-165 for which he  uses a sliding scale.  He is not checking 2 hours after his meals.   He is on no specific diet.   Stools at times have been dark but not melenous.  He has taken citrate  in the past for constipation.   He has had chronic peripheral neuropathy symptoms in his legs.   He denies polyuria, polyphagia, polydipsia.  He has had no non-healing  skin lesions.   EXAM:  VITAL SIGNS:  Weight was 243, temp was 99, pulse 64, respiratory  rate 18, and blood pressure 138/80.  GENERAL:  Despite his symptoms & the profound anemia, he sits quietly in  no distress.  HEENT:  Arteriolar narrowing is present.  Nares and otic canals are  clear.  Dental hygiene is fairly good.  The most striking finding is  bulbous distortion of his nose with pitting and erythema.  NECK:  He has no neck vein distention at 0 degrees.  LYMPHATICS:  He has no lymphadenopathy to the head, neck, or axillae.  CHEST:  Clear.  HEART:  A grade 1 to 1-1/2 systolic murmur is noted across the  precordium.  He has bilateral carotid bruits.  EXTREMITIES:  Pedal pulses are decreased.  He has 1-1/2+ edema of the  right lower extremity.  Homan sign was negative.  ABDOMEN:  Protuberant with an umbilical hernia.  He has dullness in both  upper quadrants without definite organomegaly.  RECTAL:  Deferred because of the chest pain.  SKIN:  He has sclerotic changes  over the shins with hyperpigmentation.   He has debilitated and deconditioned and has difficulty even climbing on the table and getting up and down from a supine position.   PSYCHIATRIC:  His affect is  markedly flat.  His insight is poor.  Despite his known varices and previous GI bleed, he has failed to keep  his appointment with Dr. Jarold Motto.  He  also demonstrated a lack of  insight as he is more concerned about the possibility of decreased  efficacy of Zetia than health or life threatening risks of his continued  drinking with esophageal varices & hepatic dysfunction.   He will be admitted and transfused to a hemoglobin level of 10, in an  attempt to prevent angina or dysrhythmias.   A venous Doppler will be performed.   If the chest pain fails to resolve, or if the anemia is progressive  despite transfusions, then both cardiology and GI consultations will be  pursued.      Titus Dubin. Alwyn Ren, MD,FACP,FCCP  Electronically Signed     WFH/MEDQ  D:  03/15/2006  T:  03/15/2006  Job:  [id number]

## 2010-07-09 NOTE — Consult Note (Signed)
   NAMECASSELL, VOORHIES                         ACCOUNT NO.:  [id number]   MEDICAL RECORD NO.:  [id number]                   PATIENT TYPE:  INP   LOCATION:  2017                                 FACILITY:  [facility]   PHYSICIAN:  Ronnell Guadalajara, M.D.                DATE OF BIRTH:  25-Jan-1936   DATE OF CONSULTATION:  07/31/2002  DATE OF DISCHARGE:                                   CONSULTATION   REFERRING PHYSICIAN:  Dr. Stacie Glaze.   HISTORY OF PRESENT ILLNESS:  Mr. Gernert is a pleasant patient of mine who had  a right total knee replacement three years ago and has been doing well with  it.  He is admitted at this time now with cellulitis involving the lower leg  and we admitted to see him because of the knee replacement.  He has already  been seen by infectious disease, just before me, and his history is  certainly bizarre with some chest pain and fevers and chills beginning  several weeks ago, thought initially to be some pneumonia, had been at two  different hospitals before he finally got here.  This cellulitis began a  week or two after the onset of all this illness in this groin, starting with  discomfort in the groin and some red streaking down the medial side of the  knee, and then beginning below the tibial tubercle, from there to the ankle,  below the incision above the knee and sparing everything around the knee  itself.   ASSESSMENT AND PLAN:  He has an excellent extension of the knee, beautiful  flexion, no pain about it, no tenderness, no effusion, so luckily, it seems  to have escaped and not seated into the knee joint.  He is on a healthy dose  of intravenous  antibiotics and being seen by infectious disease.  I will  just look in on him periodically.  I do no think anything needs to be done  for the knee at this point.                                                Ronnell Guadalajara, M.D.    PC/MEDQ  D:  07/31/2002  T:  07/31/2002  Job:  [id number]

## 2010-07-09 NOTE — Letter (Signed)
March 17, 2006    Titus Dubin. Alwyn Ren, MD,FACP,FCCP  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 As you know, he has Laennec's alcoholic cirrhosis  with associated varices, continued alcohol abuse, paroxysmal atrial  fibrillation, hypertension, gout, type 2 diabetes, peripheral vascular  disease and obstructive sleep apnea. The patient recently had endoscopy  with Dr. Leone Payor during his hospitalization and a gastric biopsy showed  evidence of GAVE syndrome and he was recommended laser therapy to his  stomach.   I saw [name] today and he is fairly asymptomatic. I will ask Dr. Leone Payor  to assume his care since I really have had no success with [facility] over the  years in having any effect on his medical management in terms of his  stopping his drinking which continues to exacerbate all of his medical  problems. He really has very little or no incentive whatsoever to help  himself in my opinion after multiple years of taking him. I have really  lost patience with him at this time. I have gone ahead and set him up to  see Dr. Leone Payor. I will transfer his care to Dr. Leone Payor and let  him proceed with endoscopy and ERBE therapy to his stomach if he thinks  it is successful. I have given [name] AcipHex today for his use and  have asked him to continue all of his other multiple medications.    Sincerely,      Vania Rea. Jarold Motto, MD, Caleen Essex, FAGA  Electronically Signed    DRP/MedQ  DD: 03/17/2006  DT: 03/17/2006  Job #: [id number]   CC:    Iva Boop, MD,FACG

## 2010-07-09 NOTE — Discharge Summary (Signed)
NAMESTOKELY, [name]                   ACCOUNT NO.:  [id number]   MEDICAL RECORD NO.:  [id number]          PATIENT TYPE:  INP   LOCATION:  6526                         FACILITY:  [facility]   PHYSICIAN:  [name], ANP DATE OF BIRTH:  01-24-1936   DATE OF ADMISSION:  11/28/2005  DATE OF DISCHARGE:  11/29/2005                                 DISCHARGE SUMMARY   DICTATED FOR:  Dr. Willa [name].   PRIMARY CARDIOLOGIST:  Dr. Myrtis [name].   PRIMARY CARE PHYSICIAN:  Dr. Alwyn [name].   PRINCIPAL DIAGNOSIS:  Chest pain.   SECONDARY DIAGNOSES:  1. History of coronary artery disease, status post coronary artery bypass      graft in 1993.  2. Hypertension.  3. Type II diabetes mellitus.  4. Gout.  5. History of atrial fibrillation.   ALLERGIES:  CODEINE, SULFA, DILAUDID, MORPHINE, OXYCODONE, SHELLFISH, and  DEMEROL, as well as IV CONTRAST.   PROCEDURE:  None.   HISTORY OF PRESENT ILLNESS:  75 year old white male with prior history of  CAD as outlined above who developed sharp left forearm pain lasting  approximately 30 minutes, and resolving spontaneously on Friday, October  5th.  Symptoms recurred on Saturday, October 6th, and were relieved with 2  Darvocet.  He had recurrence again on Sunday, as well as Monday, October  8th, thus prompting him to call Dr. Henrietta [name] office, and at that point, was  advised to come into the ED.  In the ED, ECG was without any acute changes,  and cardiac markers were negative.  He was admitted for additional  evaluation.   HOSPITAL COURSE:  [name] ruled out for MI by cardiac markers x2.  He has  not had any recurrent chest or arm pain, and his symptoms are felt to be  fairly atypical.  We have arranged for an outpatient adenosine Myoview and  he will also followup with Dr. Myrtis [name].  He is being discharged home today in  satisfactory condition.   DISCHARGE LABS:  Hemoglobin 10.4, hematocrit 31.8, WBC 5.7, platelets 110,  MCV 84.3, neutrophils 69, sodium 139,  potassium 3.8, chloride 103, SGOT 29,  BUN 11, creatinine 1.1, glucose 170, PT 15.3, INR 1.2, PTT 33, total  bilirubin 0.5, alkaline phosphatase 76, AST 34, ALT 29, albumin 3.7.  Cardiac markers were negative x2.  Total cholesterol 232, triglycerides 172,  HDL 39, LDL 159, calcium 9.6.  Urinalysis was negative.  Hemoglobin A1c is  6.6, TSH 1.857, d-Dimer is 0.25.   DISPOSITION:  Patient is being discharged home today in good condition.   FOLLOWUP:  Plans and appointments:  He has followup appointment with Dr. Myrtis [name] on November 5th at 9:00 a.m.  We  will also arrange an adenosine Myoview to rule out ischemia within the next  2 weeks.   DISCHARGE MEDICATIONS:  1. Allopurinol 300 mg q. day.  2. Amitriptyline 10 mg q. day.  3. Diltiazem 180 mg q. day.  4. Imdur 90 mg q. day.  5. Lantus 18 units sub cu q.h.s.  6. Lasix 40 mg q. day.  7. Zetia 10 mg  q. day.  8. AcipHex 20 mg q. day.  9. Propranolol 20 mg b.i.d.  10.Nitroglycerin 0.4 mg sublingual p.r.n. chest pain.   OUTSTANDING LAB STUDIES:  None.   DURATION DISCHARGE ENCOUNTER:  35 minutes, including physician time.           ______________________________  [name], ANP     CB/MEDQ  D:  11/29/2005  T:  11/29/2005  Job:  [id number]   cc:   [name]. [name] Ren, MD,FACP,FCCP

## 2010-07-09 NOTE — Assessment & Plan Note (Signed)
[facility]                         GASTROENTEROLOGY OFFICE NOTE   NAME:[name], [name]                          MRN:          [id number]  DATE:03/21/2006                            DOB:          05/21/1935    CHIEF COMPLAINT:  Followup of anemia and cirrhosis.   [name] came to see Dr. Jarold Motto; a followup had been arranged. It was  arranged for me to see him due to prior non-compliance with advice about  drinking.  I had seen [name] in the hospital.  He had presented with  severe anemia.  He has fortunately stopped drinking and started AA and  is congratulated.  At the time I had performed an endoscopy that showed  small varices of the esophagus, suspected short-segment Barrett's  esophagus, and erythematous changes in the antrum.  Pathology returned  indicating that this might be gastric antral vascular ectasis.  His  stool was heme-negative at that time.  He has started p.o. iron.  He is  on AcipHex twice daily that was recommended by the other  gastroenterologists that follow up on him.  This is very costly to him.  He is on propranolol.  He is feeling better.   Past medical history reviewed and unchanged from my consultation note  and the discharge summary that is in our chart.  Medications are listed  and reviewed.  Allergies are updated.   Weight 240 pounds, pulse 82, blood pressure 120/60.   ASSESSMENT:  1. Anemia, probably multifactorial.  He was heme negative at the      hospital.  His iron saturation was low.  I suspect it was a      combination of diet and perhaps some chronic blood loss.  He has      not had melena or other findings like that.  He has responded to      p.o. iron in the past.  2. Alcoholic cirrhosis.  He has stopped drinking.  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   PLAN:  1. Continue iron.  2. Check CBC and a protime today.  3. He has follow up with Dr. Alwyn Ren in 3 days.  4. See me in 2 months, at  which time I will reassess.  He may benefit      from ablation of this gastric antral change.  I think at this time,      since there is no urgency to it, I would like to hold off and see      how he does with iron supplementation and the alcohol cessation.      Should things change, we could speed that up.   I will see him back in 2 months as noted.  I will also change him to  omeprazole 20 mg daily.     Iva Boop, MD,FACG  Electronically Signed   CEG/MedQ  DD: 03/21/2006  DT: 03/21/2006  Job #: [id number]   cc:   Titus Dubin. Alwyn Ren, MD,FACP,FCCP

## 2010-07-09 NOTE — Discharge Summary (Signed)
[name], [name]                         ACCOUNT NO.:  [id number]   MEDICAL RECORD NO.:  [id number]                   PATIENT TYPE:  INP   LOCATION:  2017                                 FACILITY:  [facility]   PHYSICIAN:  Rene Paci, M.D. [facility]          DATE OF BIRTH:  [birth year]   DATE OF ADMISSION:  07/30/2002  DATE OF DISCHARGE:  08/02/2002                                 DISCHARGE SUMMARY   DISCHARGE DIAGNOSES:  1. Group B streptococcal sepsis secondary to right lower extremity     cellulitis, continue IV antibiotics via home health, improved.  2. Chest pain/shortness of breath, noncardiac, resolved.  3. Acute renal failure, resolved.  4. Discharge creatinine 1.2.  5. Iron-deficiency anemia status post gastrointestinal workup, March 2004,     with grade 2 varices on EGD left gastritis, with Dr. Jarold Motto, as an     outpatient.  Continue proton pump inhibitor and iron suppository.  6. Paroxysmal atrial fibrillation/coronary artery disease.  Continue     medication management.  7. Type 2 diabetes.  8. Alcoholic cirrhosis, compensated, except portal hypertension with     varices.  9. Barrett's esophagitis, per gastroenterology, outpatient.  10.      Status post right total knee replacement, no septic involvement.  11.      Abnormal thyroid function, sick euthyroid syndrome.  Check thyroid     function tests in 6-8 weeks.   DISCHARGE MEDICATIONS:  Rocephin 1 g IV daily times total of 14 days,  through June 22.  May then discontinue PICC line after dose on Tuesday, June  22.   Other medications are as prior to admission and include:  1. Protonix 40 mg p.o. daily.  2. Allopurinol 300 mg p.o. daily.  3. Lasix 40 mg p.o. daily.  4. Elavil 10 mg p.o. q.h.s.  5. Aspirin 81 mg p.o. daily.  6. Amiodarone 100 mg p.o. daily.  7. Diltiazem CD 180 mg p.o. daily.  8. Lopressor 12.5 mg p.o. b.i.d.  9. Imdur 90 mg p.o. daily.  10.      Glucophage 1 g p.o. b.i.d.  11.       Glucotrol 5 mg p.o. q.a.m.  12.      Lantus 10 units q.h.s.   HOSPITAL FOLLOWUP:  Dr. Marga Melnick, to be scheduled by the patient in  the next week.  He is also to contact Dr. Jarold Motto, of [facility], to  schedule his colonoscopy and for an eventual Cardiolite to be arranged by  Dr. Alwyn Ren after acute infection issues have resolved.   DISPOSITION:  The patient is discharged to home.   CONDITION ON DISCHARGE:  Medically stable and improved.    HOSPITAL COURSE:  1. Group B streptococcal sepsis secondary to cellulitis.  2. The patient is a 75 year old patient with known alcoholic cirrhosis, who     was transferred to [facility] from [facility]  secondary to chills, chest pain, and evidence of group B bacteremia.     Clinically, he had evidence of lower extremity cellulitis.  He had begun     on Zosyn and clindamycin prior to transfer to [facility].  Upon     arrival, an ID consult was obtained, who evaluated the patient for other     sources of infection.  A 2-D echo was performed and was without evidence     of endocarditis.  No other sources for group B streptococcus can be     identified and ID recommended a total 14-day treatment with Rocephin     which has been arranged for home health therapy.  He also received local     skin care wound dressing for his lower extremity cellulitis.  He is to     see his primary care physician prior to discontinuation of home health     antibiotics, to ensure resolution of the cellulitis.  He is to return to     the emergency room if he has fevers, chills, or worsening of his leg     symptoms.  3. Chest pain/shortness of breath.  Cardiology consult was obtained, who     performed serial enzymes.  These were negative.  Cardiology thought that     he was stable to continue medical management of his known coronary artery     disease status post CABG in 1993, and undergo a Cardiolite once acute     infectious process  was resolved.  Medicines as above.  4. Type 2 diabetes.  The patient's CBGs were controlled with his medication     sliding scale during his hospital, and current medications prior to     admission without change.  5. Acute renal failure.  This was felt to be secondary to mild decreased     blood pressure and prerenal dehydration.  This returned to his baseline     and was normal at the time of discharge.  6. Iron-deficiency anemia.  The patient was with previous GI workup, three     months prior, as described above.  Continue proton pump inhibitor and     monitoring as per GI.                                                Rene Paci, M.D. [facility]    VL/MEDQ  D:  12/19/2002  T:  12/19/2002  Job:  [id number]

## 2010-07-09 NOTE — Assessment & Plan Note (Signed)
[facility]                         GASTROENTEROLOGY OFFICE NOTE   NAME:[name], [name]                          MRN:          [id number]  DATE:05/31/2006                            DOB:          1935-07-15    CHIEF COMPLAINT:  Followup of anemia, cirrhosis, heartburn.   Mr. Mahler is doing well on omeprazole, controlling his gastroesophageal  reflux disease.  As far as cirrhosis is concerned, he is not drinking  anymore.  His anemia has been improving on iron with a hemoglobin in the  10 range a couple of months ago.  He remains on his iron.  He has not  seen any melena or bright red blood per rectum.  He has no abdominal  pain.   His review of systems is notable for back pain for which he got a  steroid injection last week, and his blood sugars have been increased.  He had a melanoma removed from the right side of his neck with Mohs'  surgery through Dr. Irene Limbo.  He says that Dr. Irene Limbo thinks that it  has been completely removed.  He denies any fatigue, fever or chills.  He is not having difficulty staying off alcohol.  We discussed vaccines,  and he thinks he got a Pneumovax with Dr. Alwyn Ren last year.   PHYSICAL EXAMINATION:  Weight 233 pounds, pulse 60, blood pressure  132/72.  The eyes are anicteric.  SKIN/FACE:  Shows rhinophyma.  LUNGS:  Clear.  HEART:  S1, S2, no rubs, murmurs, or gallops.  ABDOMEN:  Diastasis recti/ventral hernia.  It is obese.  LOWER EXTREMITIES:  Free of edema.  He is alert and oriented x3.  The skin is warm and dry, otherwise.   MEDICATIONS:  Listed and reviewed in the chart.   ASSESSMENT:  1. Alcoholic cirrhosis, currently abstinent.  Overall, he appears      improved.  He has actually lost some weight.  I do not think he has      fluid overload at this point.  His varices are small and at low      risk of bleeding, though he is on a beta blocker.  2. Anemia which I think is multifactorial.  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  He is compliant with iron and is improving.  3. Alcoholism, in remission.  4. Gastroesophageal reflux disease, controlled on omeprazole.   Please see details of my last note and other hospital records for full  details of his past medical history, it is outlined there.  Melanoma is  added to his past medical history.   PLAN:  1. He is reminded he should get a flu shot every year, and if he has      not had a Pneumovax, he could get one of those through Dr. Alwyn Ren.      Will defer to Dr. Alwyn Ren on that.  2. Will check hepatitis A antibody total and hepatitis B antibody.      Given his chronic liver disease, vaccination for these, especially      hepatitis A, I think, in his situation, would be appropriate.  3. Followup CBC, check a BMET and an hepatic function panel today for      routine labs.  He is on diuretics.  We want to check his potassium,      creatinine, as well as follow up on his LFTs given his alcoholic      cirrhosis.  4. Followup to be arranged pending labs.  5. Continue other medications as he is on.     Iva Boop, MD,FACG  Electronically Signed    CEG/MedQ  DD: 05/31/2006  DT: 05/31/2006  Job #: [id number]   cc:   Titus Dubin. Alwyn Ren, MD,FACP,FCCP

## 2010-07-09 NOTE — H&P (Signed)
[facility]  Patient:    [name], [name]                      MRN: [id number] Adm. Date:  07/28/99 Attending:  Philips J. Montez Morita, M.D. Dictator:   Grayland Jack, P.A. CC:         Titus Dubin. Alwyn Ren, M.D. [facility]             Luis Abed, M.D. [facility]                         History and Physical  DATE OF BIRTH:  22-Jan-1936  CHIEF COMPLAINT:  Bilateral knee pain.  HISTORY OF PRESENT ILLNESS:  This 75 year old male has had a longstanding history of bilateral knee pain.  It has been chronic and progressive in nature and has begun to interfere with his activities of daily living.  He has been tried on numerous conservative treatments, including oral anti-inflammatories, as well as Hyalgan injections.  His symptoms have been refractory to conservative treatment. X-rays show degenerative changes, most pronounced at the patella, with some spurring and sclerosis.  An MRI did reveal some patellofemoral degenerative changes with grade 4 chondromalacia and medial compartment degenerative changes.  It is  felt at this time that the patient would best benefit from surgical intervention. The risks, benefits, and complications of surgery were discussed with the patient in detail, and he agreed to proceed.  ALLERGIES:  DILAUDID, CODEINE, SULFA, AND SHELL FISH.  CURRENT MEDICATIONS:  1. Glucophage 500 mg one tablet b.i.d.  2. Allopurinol 300 mg one tablet q.d.  3. Imdur 60 mg one tablet q.d.  4. Cardizem CD 180 mg one capsule q.d.  5. Prevacid 30 mg q.d.  6. Toprol XL 50 mg 1/2 tablet q.d.  7. Furosemide 40 mg one tablet q.d.  8. Pacerone 200 mg 1/2 tablet q.d.  9. Glucotrol XL 5 mg one tablet q.d. 10. Lipitor 40 mg one tablet q.d. 11. Amitriptyline 10 mg q.h.s. 12. Claritin 10 mg q.d. 13. Enteric-coated aspirin one tablet q.d. 14. NitroQuick 0.4 mg p.r.n. chest discomfort. 15. Vitamin C 250 mg q.d. 16. Folic acid 800 mcg q.d. 17. Vitamin B complex  one tablet q.d. 18. Multivitamin one p.o. q.d.  PAST MEDICAL HISTORY: 1. Significant for history of atrial fibrillation. 2. Hypertension. 3. Gout. 4. Diabetes mellitus. 5. History of cholestatic hepatitis, secondary to alcohol use. 6. Coronary artery disease. 7. Hyperlipidemia.  PAST SURGICAL HISTORY: 1. Significant for a hiatal hernia repair. 2. Lumbar laminectomy x 3. 3. Lumbar fusion. 4. Hemorrhoidectomy. 5. Ulnar nerve decompression at the elbow. 6. CABG  x 6. 7. Inguinal hernia repair. 8. Right shoulder scoped. 9. Dupuytrens contracture.  The patients cardiologist is Dr. Luis Abed.  The patients primary care doctor is Dr. Titus Dubin. Hopper.  SOCIAL HISTORY:  The patient lives in [place] with his wife.  They reside in a single-story home with four steps into the usual entrance.  The patient is a retired Psychologist, occupational.  He denies tobacco use.  Positive alcohol use, approximately one pint per day.  The patient states that he has not had anything to drink in a few days, and plans to not drink until after his surgery.  FAMILY HISTORY:  Significant cancer, heart disease, and hypertension.  REVIEW OF SYSTEMS:  GENERAL:  The patient denies fever, chills, weight changes, or malaise.  HEENT:  The patient denies headaches or visual changes,  ear pain, or pressure, nasal congestion, or discharge, sore throats, or dysphagia. RESPIRATORY: The patient denies a productive cough, hemoptysis, or shortness of breath. CARDIAC:  The patient denies chest pain, palpitations, angina, dyspnea on exertion. GI:  The patient denies abdominal pain, nausea, vomiting, constipation, diarrhea, or bloody stools.  GU:  The patient denies dysuria, hematuria, increasing frequency or hesitancy.  MUSCULOSKELETAL:  See the HPI.  HEMATOLOGICAL:  The patient denies anemia or bleeding tendencies.  NEURO:  The patient denies a history of TIAs or  CVAs.  PHYSICAL EXAMINATION:  VITAL SIGNS:  Temperature  afebrile, pulse 74, respirations 16, blood pressure 146/70.  GENERAL:  This is a well-developed, well-nourished 75 year old male, in no acute distress.  HEENT:  Normocephalic, atraumatic.  Pupils equal, round, reactive to light. Extraocular muscles are intact.  NECK:  Supple, no jugular venous distention, no lymphadenopathy.  No carotid bruits appreciated.  CHEST:  Clear to auscultation.  No rales or rhonchi.  No wheezing bilaterally.   HEART:  A regular rate and rhythm.  No rubs, no gallops, no murmurs appreciated.  ABDOMEN:  Obese, positive bowel sounds.  Soft, nontender, no organomegaly appreciated.  BREASTS/RECTAL/GU:  Examinations not done, not pertinent to the present illness.  EXTREMITIES:  Sensation intact to bilateral lower extremities.  Motor is intact  bilaterally.  Strength is 5/5 bilaterally.  The patient does have some crepitus of the right knee with active range of motion.  The patient is somewhat tender in he medial joint line.  IMPRESSION: 1. Bilateral knee osteoarthritis, right more than left. 2. History of atrial fibrillation. 3. Hypertension. 4. Gout. 5. Diabetes mellitus. 6. History of cholestatic hepatitis, secondary to alcohol use. 7. Coronary artery disease. 8. Hyperlipidemia.  PLAN:  The patient will be admitted to [facility] on July 28, 1999, to undergo a right total knee arthroplasty with Dr. Michael Litter. Montez Morita. He has donated 2 units of autologous blood.  The procedure and postoperative course have been discussed with the patient in detail.  Dr. Alwyn Ren and Dr. Myrtis Ser will be called to help manage this patients medical conditions while in the hospital. ll questions have been encouraged and answered.DD:  07/21/99 TD:  07/21/99 Job: [id number]

## 2010-07-09 NOTE — Consult Note (Signed)
[name], [name]                   ACCOUNT NO.:  [id number]   MEDICAL RECORD NO.:  [id number]          PATIENT TYPE:  INP   LOCATION:  3732                         FACILITY:  [facility]   PHYSICIAN:  Iva Boop, MD,FACGDATE OF BIRTH:  1935-04-03   DATE OF CONSULTATION:  03/10/2006  DATE OF DISCHARGE:                                 CONSULTATION   ADDENDUM:  The patient's stool was brown and hemoccult negative today on  rectal exam.      Iva Boop, MD,FACG  Electronically Signed     CEG/MEDQ  D:  03/10/2006  T:  03/11/2006  Job:  [id number]

## 2010-07-09 NOTE — Assessment & Plan Note (Signed)
[facility]                            CARDIOLOGY OFFICE NOTE   NAME:Grunewald, CLARKSON ROSSELLI                          MRN:          [id number]  DATE:08/21/2007                            DOB:          02-12-1936    Mr. [name] had been away and called to say that he was feeling some  skipped beats.  Decision was made for him to come in the office.  His  EKG showed sinus rhythm.  Dr. Excell Seltzer decided that he should have a  Holter.  The Holter was placed.  It shows that he had normal sinus at  start and then had atrial fibrillation also during the study.  At times  with his atrial fib, there was a slow rate.  The patient overall was  feeling better.  We will contact him to make sure he knows we think he  is stable, but plan to see him back in the office for followup.  Medication review shows that he is on Cardizem 180.  He is also on  metoprolol 25 b.i.d.  I will suggest that he cut his metoprolol down a  12.5 b.i.d.     Luis Abed, MD, [facility]  Electronically Signed    JDK/MedQ  DD: 08/21/2007  DT: 08/22/2007  Job #: [id number]

## 2010-07-09 NOTE — Op Note (Signed)
[name], [name]                   ACCOUNT NO.:  [id number]   MEDICAL RECORD NO.:  [id number]          PATIENT TYPE:  AMB   LOCATION:  ENDO                         FACILITY:  [facility]   PHYSICIAN:  Iva Boop, M.D. LHCDATE OF BIRTH:  [birth year]   DATE OF PROCEDURE:  12/08/2004  DATE OF DISCHARGE:  12/02/2004                                 OPERATIVE REPORT   PROCEDURE:  Small bowel capsule endoscopy.   HISTORY:  This is a 75 year old man with cirrhosis and recent  hospitalization with severe anemia.  Dr. Jarold Motto has referred him for  small bowel capsule endoscopy.   The patient's height is 74 inches and weight 229 pounds.  Waist 42 inches.  Build stocky.   The gastric passage time was 1 hour, 44 minutes, which is somewhat long but  not technically abnormal.  The capsule did not reach the colon, so it is an  incomplete study.   ASSESSMENT:  1.  There are some red spots in the stomach that are possibly consistent      with portal gastropathy.  They are not actively bleeding.  Could be      gastritis as well.  2.  There is some phlebectasia in the small bowel.  3.  There is one tiny small bowel polyp that I think is of no clinical      significance, although would defer to Dr. Jarold Motto.   SUMMARY/RECOMMENDATIONS:  There are no obvious causes of anemia.  Further  plans for Dr. Jarold Motto.      Iva Boop, M.D. [facility]  Electronically Signed     CEG/MEDQ  D:  12/08/2004  T:  12/08/2004  Job:  [id number]   cc:   Vania Rea. Jarold Motto, M.D. [facility]  [address]  [place]  [place] [address]

## 2010-07-09 NOTE — Discharge Summary (Signed)
[name], [name]                   ACCOUNT NO.:  [id number]   MEDICAL RECORD NO.:  [id number]          PATIENT TYPE:  INP   LOCATION:  3732                         FACILITY:  [facility]   PHYSICIAN:  Valerie A. Felicity Coyer, MDDATE OF BIRTH:  05-04-1935   DATE OF ADMISSION:  DATE OF DISCHARGE:  03/14/2006                               DISCHARGE SUMMARY   DISCHARGE DIAGNOSES:  1. Acute blood loss anemia, status post esophagogastroduodenoscopy      March 10, 2006 per Dr. Stan Head  with noting esophageal      varices non-bleeding, probable Barrett's esophagus, and mucosal      erythema.  2. Diabetes type 2, well controlled.  3. Iron deficiency anemia.  4. Ventricular tachycardia.   HISTORY OF PRESENT ILLNESS:  [name] is a 75 year old male who was  admitted on March 09, 2006 with a chief complaint of chest pain,  shortness of breath and right lower extremity edema.  He was noted to be  anemic on admission was admitted for further evaluation and treatment.   PAST MEDICAL HISTORY:  1. History of coronary artery disease, status post CABG in 1993.  2. Cirrhosis with ongoing alcohol abuse.  3. Paroxysmal atrial fibrillation.  4. Hypertension.  5. Gout.  6. Diabetes type 2.  7. Obstructive sleep apnea on CPAP.  8. Bilateral carotid bruits with negative Doppler in 2004.   COURSE OF HOSPITALIZATION AND PROBLEMS:  1. Acute blood loss anemia with iron deficiency.  The patient was      admitted and was noted to have a hemoglobin of 6.7 on March 09, 2006.  He underwent 2 units of packed red blood cells transfused      and his hemoglobin rose to 9.1.  He has had no further signs of      active bleeding and his weakness has resolved.  He did undergo an      endoscopy on January 18, which was performed by Dr. Stan Head.      This noted some erythematous mucosal changes in the antrum, which      could be gastric antral vascular ectasia, a biopsy was taken at      that time.  He was  noted to have grade 1 esophageal varices without      bleeding and probable Barrett's esophagus.  It was recommended that      the patient be placed on a b.i.d. PPI.  Pap is pending at the time      of this dictation.  He was also noted to have depressed iron levels      and was started on p.o. iron.  He has remained hemodynamically      stable.  2. V-tach.  The patient had an episode of V-tach during this admission      was seen in consultation by [facility], Dr. Arvilla Meres.  It was felt per Dr. Gala Romney that the prolonged wide      complex tachycardia noted on the monitor was artifact and he  confirmed this by EKG the same time, which confirmed a normal sinus      rhythm.  Cardiology deferred any further invasive cardiac workup at      present.  He was started on metoprolol in place of propranolol,      which Dr. Eden Emms suggested be continued at 25 mg p.o. q.12 hours to      suppress any arrhythmias.  3. Diabetes type 2.  The patient was noted to have some uncontrolled      sugars in the high 200s during this admission.  His Lantus insulin      was titrated up and was increased to 27 units subcutaneously daily      prior to discharge.   MEDICATIONS AT TIME OF DISCHARGE:  1. Allopurinol 300 mg p.o. daily.  2. Imdur 90 mg p.o. daily.  3. Diltiazem CD 180 mg p.o. daily.  4. Amitriptyline 10 mg p.o. q.h.s.  5. Glucophage 500 mg p.o. b.i.d.  6. Lasix 40 mg p.o. daily.  7. Zetia 10 mg p.o. daily.  8. Metoprolol 25 mg p.o. b.i.d.  9. AcipHex 20 mg p.o. b.i.d.  10.Nu-Iron 150 mg p.o. daily.  11.Vitamin C 500 mg p.o. daily.  12.Lantus 27 unit injection once daily in the evening.  13.Flexeril 5 mg p.o. t.i.d. as needed.  14.Multivitamin 1 tablet p.o. daily/  15.Darvocet N 100 1-2 tablets p.o. q.4 hours as needed.   PERTINENT LABORATORY DATA:  At time of discharge iron is 222.  Hemoglobin A1c 7.2.  Hemoglobin 9.1, hematocrit 28.  TSH 1.604.  Cardiac  enzymes  negative on March 12, 2006.   DISPOSITION:  The the patient will be discharged to home.  He is  instructed to follow up with Dr. Alwyn Ren on March 23, 2006 at 10:30  a.m.  He is instructed to avoid alcohol and contact Dr. Alwyn Ren if he  develops weakness, change in stools, vomiting and sugars less than 80 or  over 300.   FOLLOWUP:  The patient to follow up with Dr. Alwyn Ren on March 23, 2006  at 10:30 a.m.  He will need a repeat CBC at that time.      Sandford Craze, NP      Raenette Rover. Felicity Coyer, MD  Electronically Signed    MO/MEDQ  D:  03/14/2006  T:  03/14/2006  Job:  [id number]   cc:   Titus Dubin. Alwyn Ren, MD,FACP,FCCP

## 2010-07-09 NOTE — Letter (Signed)
March 08, 2006    Vania Rea. Jarold Motto, MD, FACG, FACP, FAGA  [address]  [place]  [place] [address]   RE:  [name], [name]  MRN:  [id number]  /  DOB:  08/10/1935   Dear Onalee Hua:   I saw [name] on January 16 complaining of chest pain present for 1 to 2  months. His last stress test had been in October of 2007 and was  negative. This is the same pain he was having at the time of the stress  test which revealed no ischemia. He describes dyspnea on exertion and  leg pain in the popliteal area bilaterally after climbing 12 steps or  walking 100 feet.   He describes epigastric dull pain daily since October 2007, which  resolves at night.   Additionally, he has had increase in edema of the right lower extremity  from the knee down. He has support hose but has not worn these for over  3 months.   He stated he had been scheduled to see you in follow up but I just  didn't go. He is on no diet despite his insulin dependent diabetes. He  states that he is drinking some. His concern was not his alcohol  intake but the recent reports of Zetia efficacy as he is on this  medication. He is not on a statin due to his alcohol-related hepatic  dysfunction.   He does state that his sugar has been running 122 to 165 fasting and he  has been on sliding scale as well as Lantus 18 units at night.   His weight was up 11 pounds to 243. Temperature was 99, pulse 64 and  regular, respiratory rate 18, and blood pressure 138/80. O2 sats were  100% on room air. He was in no distress. He had no icterus or jaundice.  There was no neck vein distention when supine.  His chest was surprisingly clear.  He had a grade 1 to 1-1/2 systolic murmur. Bilateral carotid bruits were  noted. He had 1-1/2+ edema in the right lower extremity.  Denna Haggard' sign  was negative.  There was dullness in both upper quadrants without definite  organomegaly. Abdomen was markedly protuberant. There was an umbilical  hernia.  He is  deconditioned and has difficulty even getting on and off the  table, but not related to dyspnea.  Affect is flat; obviously his insight is questionable at best in view of  his continued drinking despite your admonition of the dire consequences.   EKG was normal with no ischemic change.   His hemoglobin was 7.6; in August it was 10.4. platelet count was  112,000. Liver profile was normal. Total cholesterol was 207, LDL was  147, and HDL 40. A1C of 7.4, indicating an average blood sugar of 184  and 48% increased risk of premature heart attack or stroke. PSA was  within normal limits at 3.15.  The PSA was checked as he is no longer  seeing a urologist after Dr. Elige Radon retired.   I have called him to arrange for outpatient transfusions. I did not  admit him in view of the normal vital signs and the negative D-dimer,  troponin 1, CPK and MB, and normal EKG.   I have asked him to see you as soon as possible to follow up on the  varices.   His Lantus will be increased to 20 units at bedtime.   I did frankly tell [name] that he has to stop drinking if  he does not  want to have some catastrophic event occur such as massive, life  threatening hemorrage.   Clinically there is no evidence of deep venous thrombosis, but a venous  Doppler will also be pursued.   The transfusions will be administered because of his chest pain and  shortness of breath in the setting of the hemoglobin of 7.6.   I appreciate any recommendations you may have to help [name].  Unfortunately only he can ultimately affect his long term prognosis.    Sincerely,      Titus Dubin. Alwyn Ren, MD,FACP,FCCP  Electronically Signed    WFH/MedQ  DD: 03/09/2006  DT: 03/09/2006  Job #: [id number]   CC:    Luis Abed, MD, [facility]

## 2010-07-09 NOTE — Assessment & Plan Note (Signed)
[facility]                           GASTROENTEROLOGY OFFICE NOTE   NAME:Bise, Cortland                            MRN:          [id number]  DATE:  12/06/2005                              DOB:      1935-07-14    [name] is doing well and denies GI complaints.  He apparently was in the  emergency room a week ago with chest pain going into his left arm and had a  negative cardiac evaluation and apparently completed his exercise stress  test yesterday, the results of which are unclear.  He is on AcipHex for his  acid reflux.  In the past he had gallstones but responded to Actigall  therapy.  He had dissolution of his gallstones with a negative ultrasound  exam on October 12, 2005.  At that time the liver also appeared fairly  unremarkable.  He is on a variety of medications for his heart and also on  insulin and metformin.  He takes Inderal 20 mg twice a day for his  hypertension, also his portal gastropathy associated with alcoholic liver  disease.   Raedyn continues to use bourbon and Coke, and has no intention of quitting  and no desire to obtain any rehabilitation.  I really feel like I am wasting  my time speaking to this patient about any type of alcohol control, he has  no interest in any medications to help his craving and has no interest in  helping himself.  He denies confusion, nausea, or vomiting, abdominal  swelling, pruritus, or mental status changes.  I will speak with him and he  apparently goes to the beach by himself and his wife, [name], will not  accompany him.  He denies hematemesis, melena, or rectal bleeding.   LABORATORY DATA:  Lab data from last visit showed a borderline CDT of 2.5  consistent with heavy alcohol use.  His alpha theta protein level was normal  at 2.5.  Repeat ammonia level was normal at 17.  He has a chronic  normochromic, normocytic anemia with a hemoglobin of 10.5 and normal folate  and B12 levels.  Liver function  tests are consistent with mild liver disease  with SGOT of 34 and SGPT of 44.  His serum albumin is 4 g% which shows good  protein production by the liver.  As mentioned above his B12 level is  normal.   PHYSICAL EXAMINATION:  GENERAL:  He is a nontoxic-appearing white male who  has obvious __________.  VITAL SIGNS:  His weight today is 231 pounds which is down five pounds from  his last visit.  Blood pressure is 104/58, pulse was 60 and regular.  HEENT:  I cannot appreciate scleral icterus.  CHEST:  His chest was generally clear.  HEART:  I could not appreciate significant murmurs, gallops, or rubs.  He  was in a regular rhythm at this time.  ABDOMEN:  His abdominal exam showed no ascites but he does have a large  ventral hernia.  I could not appreciate hepatomegaly but I could feel the  spleen tip in the left  upper quadrant to deep inspiration.  Abdominal exam  otherwise was unremarkable.  EXTREMITIES:  There was no peripheral edema or phlebitis.   ASSESSMENT:  Antion seemed to be doing fairly well with his alcoholic liver  disease and Child's class I cirrhosis.  I have no advice at this time about  changing his medications, no other way to approach this patient.  He really  mostly needs primary care follow-up with Dr. Alwyn Ren.  I again have urged him  strongly to attend AA meetings, also to consider outpatient therapy at  [facility] since he is not interested in inpatient alcohol rehab  program.   RECOMMENDATIONS:  I did not send the patient by the lab today for any lab  tests.  Continue all of his medications as previously outlined with GI  follow-up in two months' time.       Vania Rea. Jarold Motto, MD, [facility], [place]      DRP/MedQ  DD:  12/06/2005  DT:  12/07/2005  Job #:  [id number]   cc:   Titus Dubin. Alwyn Ren, MD,FACP,FCCP  Luis Abed, MD, [facility]

## 2010-07-12 ENCOUNTER — Other Ambulatory Visit: Payer: Self-pay | Admitting: Dermatology

## 2010-07-27 ENCOUNTER — Encounter (HOSPITAL_BASED_OUTPATIENT_CLINIC_OR_DEPARTMENT_OTHER): Payer: Medicare Other

## 2010-07-27 ENCOUNTER — Ambulatory Visit (INDEPENDENT_AMBULATORY_CARE_PROVIDER_SITE_OTHER): Payer: Medicare Other | Admitting: Endocrinology

## 2010-07-27 ENCOUNTER — Other Ambulatory Visit: Payer: Self-pay

## 2010-07-27 ENCOUNTER — Encounter: Payer: Self-pay | Admitting: Endocrinology

## 2010-07-27 ENCOUNTER — Encounter: Payer: Self-pay | Admitting: Family Medicine

## 2010-07-27 ENCOUNTER — Other Ambulatory Visit (INDEPENDENT_AMBULATORY_CARE_PROVIDER_SITE_OTHER): Payer: Medicare Other

## 2010-07-27 VITALS — BP 122/60 | HR 58 | Temp 98.1°F | Ht 76.0 in | Wt 261.1 lb

## 2010-07-27 DIAGNOSIS — IMO0001 Reserved for inherently not codable concepts without codable children: Secondary | ICD-10-CM

## 2010-07-27 LAB — HEMOGLOBIN A1C: Hgb A1c MFr Bld: 8.2 % — ABNORMAL HIGH (ref 4.6–6.5)

## 2010-07-27 MED ORDER — "INSULIN SYRINGE-NEEDLE U-100 31G X 5/16"" 0.5 ML MISC": 1.0000 | Freq: Four times a day (QID) | Status: DC

## 2010-07-27 MED ORDER — GABAPENTIN 100 MG PO CAPS: 100.0000 mg | ORAL_CAPSULE | Freq: Three times a day (TID) | ORAL | Status: DC

## 2010-07-27 MED ORDER — GLUCOSE BLOOD VI STRP: 1.0000 | ORAL_STRIP | Freq: Two times a day (BID) | Status: DC

## 2010-07-27 MED ORDER — INSULIN GLARGINE 100 UNIT/ML ~~LOC~~ SOLN: 20.0000 [IU] | Freq: Every day | SUBCUTANEOUS | Status: DC

## 2010-07-27 MED ORDER — INSULIN ASPART 100 UNIT/ML ~~LOC~~ SOLN: 42.0000 [IU] | Freq: Three times a day (TID) | SUBCUTANEOUS | Status: DC

## 2010-07-27 NOTE — Patient Instructions (Addendum)
blood tests are being ordered for you today.  please call [phone] to hear your test results.  You will be prompted to enter the 9-digit "MRN" number that appears at the top left of this page, followed by #.  Then you will hear the message. pending the test results, please continue the same medications for now check your blood sugar 2 times a day.  vary the time of day when you check, between before the 3 meals, and at bedtime.  also check if you have symptoms of your blood sugar being too high or too low.  please keep a record of the readings and bring it to your next appointment here.  please call us sooner if you are having low blood sugar episodes. good diet and exercise habits significanly improve the control of your diabetes.  please let me know if you wish to be referred to a dietician.  high blood sugar is very risky to your health.  you should see an eye doctor every year. controlling your blood pressure and cholesterol drastically reduces the damage diabetes does to your body.  this also applies to quitting smoking.  please discuss these with your doctor.   (update: i left message on phone-tree:  rx as we discussed)

## 2010-07-27 NOTE — Progress Notes (Signed)
Subjective:    Patient ID: [name], male    DOB: Dec 30, 1935, 75 y.o.   MRN: [id number]  HPI He feels much better recently.  he brings a record of his cbg's which i have reviewed today.  He checks in am only.  It varies from 130-250.  He was recently released for the wound center, after successful rx for bilat foot ulcers.   Past Medical History  Diagnosis Date  . Diverticulosis   . Gastric antral vascular ectasia   . Rhinophyma   . Melanoma     resected by [facility] 2008  . CAD (coronary artery disease)     myoview 2007 no ischemia/ no ASA because of GI disease  . Atrial fibrillation     amiodarone in past, but problems and left off meds.  . Aortic valve sclerosis     LV function...echo September 2010.Marland KitchenEF 55%...mild AI  . Visual loss, one eye     right eye-ophthalmic arterial branch occlusion  . Internal hemorrhoids   . GERD (gastroesophageal reflux disease)   . Osteoarthritis   . Diabetes mellitus, type 2   . Hypertension   . Allergic rhinitis   . Anemia     multifactorial  . Cirrhosis, alcoholic   . Thrombocytopenia   . Bleeding esophageal varices   . Encephalopathy, unspecified   . Gout   . OSA (obstructive sleep apnea)     CPAP  . Short-segment Barrett's esophagus     suspected  . Venous insufficiency     Past Surgical History  Procedure Date  . Coronary artery bypass graft 2004    6 Bypass  . Total knee arthroplasty     right knee  . Esophagogastroduodenoscopy     12/08, 12/09, 2/10  . Tips procedure 2/10  . Laminectomy     x 4  . Hemorrhoid surgery   . Release scar contracture / graft repairs of hand     bilaterally   . Arm surgery     right   . Hiatal hernia repair     History   Social History  . Marital Status: Married    Spouse Name: N/A    Number of Children: N/A  . Years of Education: N/A   Occupational History  .      retired   Social History Main Topics  . Smoking status: Former Smoker -- 2.0 packs/day for 20 years    Types:  Cigarettes    Quit date: 02/22/1975  . Smokeless tobacco: Never Used  . Alcohol Use: No     none since Feb 2010  . Drug Use: No  . Sexually Active: Not on file   Other Topics Concern  . Not on file   Social History Narrative   Patient does not get regular exerciseQuit smoking over 40 years ago    Current Outpatient Prescriptions on File Prior to Visit  Medication Sig Dispense Refill  . allopurinol (ZYLOPRIM) 300 MG tablet Take 1/2 tablet once daily       . amitriptyline (ELAVIL) 10 MG tablet Take 10 mg by mouth at bedtime.        . Ascorbic Acid (VITAMIN C) 500 MG tablet Take 500 mg by mouth daily.        . Calcium-Magnesium-Zinc (CALCIUM/MAGNESIUM/ZINC FORMULA) 1000-500-50 MG TABS Take 1 tablet by mouth daily.        . cholecalciferol (VITAMIN D) 1000 UNITS tablet Two tablets by mouth once daily       .  diltiazem (CARDIZEM CD) 180 MG 24 hr capsule Take 180 mg by mouth daily.        . ferrous sulfate 325 (65 FE) MG tablet Take 325 mg by mouth 2 (two) times daily.        Marland Kitchen FREESTYLE LITE test strip USE TO TEST BLOOD SUGAR TWO TO THREE TIMES DAILY  100 each  3  . furosemide (LASIX) 40 MG tablet Take 40 mg by mouth daily.        Marland Kitchen gabapentin (NEURONTIN) 100 MG capsule Take 2 three times daily       . insulin glargine (LANTUS) 100 UNIT/ML injection Inject 20 Units into the skin at bedtime.        . isosorbide mononitrate (IMDUR) 30 MG 24 hr tablet Take 2 tablets (60 mg total) by mouth daily.  60 tablet  3  . lactulose (CHRONULAC) 10 GM/15ML solution Take 1 and 1/2 tablespoons two times a day       . Lancets (FREESTYLE) lancets test 2-3 times a day dx 250.02       . metoprolol (LOPRESSOR) 25 MG tablet 1/2 tablet by mouth two times a day       . NOVOLOG 100 UNIT/ML injection Inject 42 Units into the skin 3 (three) times daily before meals.       Marland Kitchen omeprazole (PRILOSEC) 20 MG capsule Take 20 mg by mouth daily.        . penicillin v potassium (VEETID) 250 MG tablet Take 250 mg by mouth  daily.        Marland Kitchen POLYETHYLENE GLYCOL 3350 PO Take by mouth. 1 1/2 tablespoons in liquid two times a day       . predniSONE (DELTASONE) 5 MG tablet Take 5 mg by mouth daily.        . rifaximin (XIFAXAN) 550 MG TABS Take 550 mg by mouth 2 (two) times daily.        Marland Kitchen spironolactone (ALDACTONE) 50 MG tablet Take 50 mg by mouth daily.        . Thiamine HCl (VITAMIN B-1) 250 MG tablet 1/2 tablet by mouth once daily       . traMADol (ULTRAM) 50 MG tablet Take 50 mg by mouth every 12 (twelve) hours.        . vitamin E 400 UNIT capsule Take 400 Units by mouth daily.        Marland Kitchen DISCONTD: insulin lispro (HUMALOG) 100 UNIT/ML injection Inject 42 Units into the skin 3 (three) times daily before meals.        Marland Kitchen DISCONTD: pentazocine (TALWIN) 30 MG/ML injection As needed for pain       . DISCONTD: Pyridoxine HCl (VITAMIN B-6) 100 MG tablet Take 100 mg by mouth daily.          Allergies  Allergen Reactions  . Aspirin     REACTION: unable to tolerate due to cirrhosis and varices  . Codeine   . Ezetimibe     REACTION: LEG/JOINT PAIN  . Hydromorphone Hcl   . ZOX:[id number]+XBJYNWGNF+AOZHYQMVHQ Acid+Aspartame   . Meperidine Hcl   . Morphine   . Morphine Sulfate     REACTION: unspecified  . Oxycodone     hallucinations  . Shellfish-Derived Products     rash  . Suboxone Other (See Comments)    unresponsive  . Sulfonamide Derivatives     Family History  Problem Relation Age of Onset  . Alzheimer's disease Mother   . Cancer Father  GI (stomach?)  . Heart disease Sister     2 sisters  . Liver disease Sister   . Diabetes Brother   . Heart disease Brother     3 brothers  . Liver disease Brother   . Heart attack Other     sibling  . Cancer Other     melanoma (sibling)  . Stroke Other     sibling    BP 122/60  Pulse 58  Temp(Src) 98.1 F (36.7 C) (Oral)  Ht 6\' 4"  (1.93 m)  Wt 261 lb 1.9 oz (118.443 kg)  BMI 31.78 kg/m2  SpO2 97%    Review of Systems denies hypoglycemia.      Objective:   Physical Exam GENERAL: no distress SKIN: Insulin injection sites at the anterior abdomen and triceps areas are normal    Lab Results  Component Value Date   HGBA1C 8.2* 07/27/2010   Assessment & Plan:  Dm, needs increased rx

## 2010-07-28 ENCOUNTER — Other Ambulatory Visit: Payer: Self-pay | Admitting: Gastroenterology

## 2010-07-28 ENCOUNTER — Other Ambulatory Visit: Payer: Self-pay | Admitting: Internal Medicine

## 2010-07-28 MED ORDER — GABAPENTIN 100 MG PO CAPS: ORAL_CAPSULE | ORAL | Status: DC

## 2010-07-28 MED ORDER — LACTULOSE 10 GM/15ML PO SOLN: ORAL | Status: DC

## 2010-07-28 NOTE — Telephone Encounter (Signed)
Refilled Lactulose 

## 2010-07-28 NOTE — Telephone Encounter (Signed)
RX sent to pharmacy  

## 2010-07-29 ENCOUNTER — Other Ambulatory Visit: Payer: Self-pay | Admitting: Internal Medicine

## 2010-07-29 NOTE — Telephone Encounter (Signed)
Patient called requesting a refill on Spironolactone. Do you want to refill this medication?

## 2010-07-30 NOTE — Telephone Encounter (Signed)
Ok to refill his meds for a few months

## 2010-07-30 NOTE — Telephone Encounter (Signed)
The prescription for spironolactone is different than what your note indicate. Is the patient to take 1 daily or 2 daily?

## 2010-08-03 ENCOUNTER — Other Ambulatory Visit: Payer: Self-pay | Admitting: Internal Medicine

## 2010-08-03 ENCOUNTER — Encounter: Payer: Self-pay | Admitting: Gastroenterology

## 2010-08-03 MED ORDER — SPIRONOLACTONE 50 MG PO TABS: 50.0000 mg | ORAL_TABLET | Freq: Every day | ORAL | Status: DC

## 2010-08-03 NOTE — Telephone Encounter (Signed)
Once daily I sent rx

## 2010-08-03 NOTE — Telephone Encounter (Signed)
Fixed on separate note

## 2010-08-03 NOTE — Telephone Encounter (Signed)
I called wife - she will call for appt (for husband) with me - to see me in July

## 2010-08-03 NOTE — Telephone Encounter (Signed)
Is the patient to take once or twice a day?

## 2010-08-03 NOTE — Telephone Encounter (Signed)
error 

## 2010-08-18 ENCOUNTER — Other Ambulatory Visit: Payer: Self-pay | Admitting: Internal Medicine

## 2010-08-18 NOTE — Telephone Encounter (Signed)
Patient is out of gabapentin 100 mg 2 capsules 3 times a day - [facility] - [place]

## 2010-08-19 ENCOUNTER — Other Ambulatory Visit: Payer: Self-pay | Admitting: Internal Medicine

## 2010-08-19 MED ORDER — GABAPENTIN 100 MG PO CAPS: ORAL_CAPSULE | ORAL | Status: DC

## 2010-08-19 NOTE — Telephone Encounter (Signed)
Duplicate

## 2010-08-19 NOTE — Telephone Encounter (Signed)
Addended by: Doristine Devoid on: 08/19/2010 04:55 PM   Modules accepted: Orders

## 2010-08-27 ENCOUNTER — Encounter: Payer: Self-pay | Admitting: Pulmonary Disease

## 2010-09-01 ENCOUNTER — Other Ambulatory Visit: Payer: Self-pay | Admitting: Internal Medicine

## 2010-09-02 ENCOUNTER — Telehealth: Payer: Self-pay

## 2010-09-02 NOTE — Telephone Encounter (Signed)
Pt's spouse called requesting Rx for DM Shoes. Per wife, pt has had been wearing DM shoes for several years, she thinks it was prescribed by Dr Alwyn Ren, but [facility] is now requesting Rx. [facility] July 18th.

## 2010-09-05 NOTE — Telephone Encounter (Signed)
Please send form, and i'll sign

## 2010-09-08 NOTE — Telephone Encounter (Signed)
Left message for wife or spouse to callback office

## 2010-09-09 ENCOUNTER — Telehealth: Payer: Self-pay | Admitting: *Deleted

## 2010-09-09 ENCOUNTER — Ambulatory Visit (INDEPENDENT_AMBULATORY_CARE_PROVIDER_SITE_OTHER): Payer: Medicare Other | Admitting: Internal Medicine

## 2010-09-09 ENCOUNTER — Encounter: Payer: Self-pay | Admitting: Internal Medicine

## 2010-09-09 DIAGNOSIS — R188 Other ascites: Secondary | ICD-10-CM

## 2010-09-09 DIAGNOSIS — K7290 Hepatic failure, unspecified without coma: Secondary | ICD-10-CM

## 2010-09-09 DIAGNOSIS — K7030 Alcoholic cirrhosis of liver without ascites: Secondary | ICD-10-CM

## 2010-09-09 NOTE — Progress Notes (Signed)
  Subjective:    Patient ID: [name], male    DOB: 09/15/1935, 75 y.o.   MRN: [id number]  HPI AM nausea, better since he started analgesics more regularly  Cramps in arms and elbows and fingers/toes. Dr. Dierdre Forth did uric acid and metaboloic panel - to send results to me - still on prednisone No significant encephalopathy issues. Legs are better, no major infections lately. Went to hyperbaric therapy and better since then. Saw Dr. Ninetta Lights. On prophylactic penicillin Going to pain management center Tick bite 3 days ago    Review of Systems As above    Objective:   Physical Exam  Constitutional: He is oriented to person, place, and time.       Chronically ill  Eyes: No scleral icterus.  Cardiovascular: Normal rate and regular rhythm.   Murmur heard.      Faint systolic murmur  Pulmonary/Chest: Effort normal and breath sounds normal.  Abdominal: Soft. Bowel sounds are normal.       Obese, Ventral and umbilical hernia BS+ Nontender, no mass No HSM  Musculoskeletal: He exhibits edema.       Marked LE edema Hose on   Neurological: He is alert and oriented to person, place, and time.       Mild tremor but no asterixis  Skin:       Slightly crusted red lesion LLQ - no tick head present and it is small 3mm max and no surrounding rash  Psychiatric: He has a normal mood and affect. Thought content normal.          Assessment & Plan:

## 2010-09-09 NOTE — Assessment & Plan Note (Signed)
Not evident on PE today

## 2010-09-09 NOTE — Assessment & Plan Note (Signed)
Doing well on lactulose and Xifaxan No changes

## 2010-09-09 NOTE — Assessment & Plan Note (Signed)
Stable overall without signs of deterioration. No change in therapy today. Review labs from Dr. Dierdre Forth and then arrange further labs.

## 2010-09-09 NOTE — Telephone Encounter (Signed)
Informed Pt that we need the request faxed over to our office for completion for Diabetic Flam request.

## 2010-09-09 NOTE — Patient Instructions (Signed)
We will contact you with assessment and plans once we get your labs from Dr. Dierdre Forth. Continue current medications. If you get a fever or skin rash in the next 2 weeks contact your Primary care doctor.

## 2010-09-16 ENCOUNTER — Other Ambulatory Visit: Payer: Self-pay | Admitting: Internal Medicine

## 2010-10-04 ENCOUNTER — Other Ambulatory Visit: Payer: Self-pay | Admitting: Internal Medicine

## 2010-10-04 NOTE — Telephone Encounter (Signed)
Called [facility] patient already have refills on his Lactulose sent in 07/2010. Patient had already called the pharmacy concerning refill and it was processed.

## 2010-10-06 ENCOUNTER — Telehealth: Payer: Self-pay | Admitting: Internal Medicine

## 2010-10-06 DIAGNOSIS — K7460 Unspecified cirrhosis of liver: Secondary | ICD-10-CM

## 2010-10-06 NOTE — Telephone Encounter (Signed)
Patient is having worsening back pain.  He would like to have another ESI but they had to be stopped due to low platelet count.  He would like to have his platelets checked again to see if they have improved enough to try and schedule a ESI for his back pain. They are aware you are out of town until next week.

## 2010-10-10 NOTE — Telephone Encounter (Signed)
CBC, CMET and PT/INR using cirrhosis diagnosis

## 2010-10-11 ENCOUNTER — Other Ambulatory Visit: Payer: Self-pay | Admitting: Internal Medicine

## 2010-10-11 NOTE — Telephone Encounter (Signed)
I have advised the patient's wife to come for labs at their convenience

## 2010-10-12 ENCOUNTER — Other Ambulatory Visit (INDEPENDENT_AMBULATORY_CARE_PROVIDER_SITE_OTHER): Payer: Medicare Other

## 2010-10-12 DIAGNOSIS — K7460 Unspecified cirrhosis of liver: Secondary | ICD-10-CM

## 2010-10-12 LAB — CBC WITH DIFFERENTIAL/PLATELET
Basophils Relative: 0.1 % (ref 0.0–3.0)
Eosinophils Relative: 0 % (ref 0.0–5.0)
HCT: 37 % — ABNORMAL LOW (ref 39.0–52.0)
Hemoglobin: 12.6 g/dL — ABNORMAL LOW (ref 13.0–17.0)
Lymphs Abs: 1.4 10*3/uL (ref 0.7–4.0)
MCV: 97.7 fl (ref 78.0–100.0)
Monocytes Absolute: 0.5 10*3/uL (ref 0.1–1.0)
Monocytes Relative: 9.2 % (ref 3.0–12.0)
Neutro Abs: 3.5 10*3/uL (ref 1.4–7.7)
Platelets: 54 10*3/uL — ABNORMAL LOW (ref 150.0–400.0)
RBC: 3.79 Mil/uL — ABNORMAL LOW (ref 4.22–5.81)
WBC: 5.4 10*3/uL (ref 4.5–10.5)

## 2010-10-12 LAB — PROTIME-INR
INR: 1.2 ratio — ABNORMAL HIGH (ref 0.8–1.0)
Prothrombin Time: 13.2 s — ABNORMAL HIGH (ref 10.2–12.4)

## 2010-10-12 LAB — COMPREHENSIVE METABOLIC PANEL
Alkaline Phosphatase: 52 U/L (ref 39–117)
BUN: 22 mg/dL (ref 6–23)
CO2: 30 mEq/L (ref 19–32)
GFR: 63.12 mL/min (ref >60.00)
Glucose, Bld: 146 mg/dL — ABNORMAL HIGH (ref 70–99)
Sodium: 140 mEq/L (ref 135–145)
Total Bilirubin: 1.3 mg/dL — ABNORMAL HIGH (ref 0.3–1.2)
Total Protein: 6.6 g/dL (ref 6.0–8.3)

## 2010-10-13 NOTE — Progress Notes (Signed)
Quick Note:  Labs are not too bad and a platelet transfusion could allow an epidural perhaps. Please ask who is the MD that would do the injection, if possible and I will try to help. ______

## 2010-10-15 ENCOUNTER — Other Ambulatory Visit: Payer: Self-pay | Admitting: Internal Medicine

## 2010-10-19 ENCOUNTER — Other Ambulatory Visit: Payer: Self-pay

## 2010-10-19 ENCOUNTER — Telehealth: Payer: Self-pay

## 2010-10-19 MED ORDER — POLYETHYLENE GLYCOL 3350 17 GM/SCOOP PO POWD: ORAL | Status: DC

## 2010-10-19 NOTE — Telephone Encounter (Signed)
I spoke with [name] at [facility] Dr Ethelene Hal office.  Per [name] a platelet count of at least 50,000 is acceptable to go ahead with ESI.  The patient hasn't been there in 2 years and will need an office visit to discuss.  I have also spoken with [name] the patient's wife she is asked to call today by [name] to set up a work in appt with Dr Ethelene Hal to discuss the possibility of an ESI.  I have also faxed a copy of the labs to her at [phone]

## 2010-10-19 NOTE — Telephone Encounter (Signed)
Message copied by Annett Fabian on Tue Oct 19, 2010  2:55 PM ------      Message from: Iva Boop      Created: Tue Oct 19, 2010  1:32 PM      Regarding: PLT's       Can you get a message to Dr. Ethelene Hal about possibility of platelet transfusion for Mr. Masten - if we could get platelets up would he be able to do the injection? What level would he need to see for the PLT's?

## 2010-10-26 ENCOUNTER — Ambulatory Visit: Payer: Medicare Other | Admitting: Endocrinology

## 2010-11-01 ENCOUNTER — Other Ambulatory Visit (INDEPENDENT_AMBULATORY_CARE_PROVIDER_SITE_OTHER): Payer: Medicare Other

## 2010-11-01 ENCOUNTER — Encounter: Payer: Self-pay | Admitting: Endocrinology

## 2010-11-01 ENCOUNTER — Ambulatory Visit (INDEPENDENT_AMBULATORY_CARE_PROVIDER_SITE_OTHER): Payer: Medicare Other | Admitting: Endocrinology

## 2010-11-01 DIAGNOSIS — R209 Unspecified disturbances of skin sensation: Secondary | ICD-10-CM

## 2010-11-01 DIAGNOSIS — R202 Paresthesia of skin: Secondary | ICD-10-CM | POA: Insufficient documentation

## 2010-11-01 DIAGNOSIS — IMO0001 Reserved for inherently not codable concepts without codable children: Secondary | ICD-10-CM

## 2010-11-01 LAB — HEMOGLOBIN A1C: Hgb A1c MFr Bld: 8.5 % — ABNORMAL HIGH (ref 4.6–6.5)

## 2010-11-01 NOTE — Progress Notes (Signed)
Subjective:    Patient ID: [name], male    DOB: Aug 22, 1935, 75 y.o.   MRN: [id number]  HPI pt states he feels well in general.  no cbg record, but states cbg's vary from 64-200.  It is lowest in the afternoon, and highest in am (he does not check at hs).  He had a steroid injection last week.  Since then, cbg's have been in the 300's he has a few mos of slight burning sensation at the feet, worst at the heels, and assoc numbnes. Past Medical History  Diagnosis Date  . Diverticulosis   . Gastric antral vascular ectasia   . Rhinophyma   . Melanoma     resected by [facility] 2008  . CAD (coronary artery disease)     myoview 2007 no ischemia/ no ASA because of GI disease  . Atrial fibrillation     amiodarone in past, but problems and left off meds.  . Aortic valve sclerosis     LV function...echo September 2010.Marland KitchenEF 55%...mild AI  . Visual loss, one eye     right eye-ophthalmic arterial branch occlusion  . Internal hemorrhoids   . GERD (gastroesophageal reflux disease)   . Osteoarthritis   . Diabetes mellitus, type 2   . Hypertension   . Allergic rhinitis   . Anemia     multifactorial  . Cirrhosis, alcoholic   . Thrombocytopenia   . Bleeding esophageal varices   . Encephalopathy, unspecified   . Gout   . OSA (obstructive sleep apnea)     CPAP  . Short-segment Barrett's esophagus     suspected  . Venous insufficiency     Past Surgical History  Procedure Date  . Coronary artery bypass graft 2004    6 Bypass  . Total knee arthroplasty     right knee  . Esophagogastroduodenoscopy     12/08, 12/09, 2/10  . Tips procedure 2/10  . Laminectomy     x 4  . Hemorrhoid surgery   . Release scar contracture / graft repairs of hand     bilaterally   . Arm surgery     right   . Hiatal hernia repair   . Colonoscopy w/ polypectomy 02/14/2007    8 adenomas, diverticulosis, hemorrhoids/rectal varices    History   Social History  . Marital Status: Married    Spouse Name: N/A     Number of Children: N/A  . Years of Education: N/A   Occupational History  .      retired   Social History Main Topics  . Smoking status: Former Smoker -- 2.0 packs/day for 20 years    Types: Cigarettes    Quit date: 02/22/1975  . Smokeless tobacco: Never Used  . Alcohol Use: No     none since Feb 2010  . Drug Use: No  . Sexually Active: Not on file   Other Topics Concern  . Not on file   Social History Narrative   Patient does not get regular exerciseQuit smoking over 40 years ago    Current Outpatient Prescriptions on File Prior to Visit  Medication Sig Dispense Refill  . allopurinol (ZYLOPRIM) 300 MG tablet Take 1/2 tablet once daily       . amitriptyline (ELAVIL) 10 MG tablet TAKE 1 TABLET AT BEDTIME  90 tablet  1  . Ascorbic Acid (VITAMIN C) 500 MG tablet Take 500 mg by mouth daily.        . Calcium-Magnesium-Zinc (CALCIUM/MAGNESIUM/ZINC FORMULA) 1000-500-50 MG  TABS Take 1 tablet by mouth daily.        . cholecalciferol (VITAMIN D) 1000 UNITS tablet Two tablets by mouth once daily       . diltiazem (CARDIZEM CD) 180 MG 24 hr capsule Take 180 mg by mouth daily.        . ferrous sulfate 325 (65 FE) MG tablet Take 325 mg by mouth 2 (two) times daily.        . furosemide (LASIX) 40 MG tablet Take 40 mg by mouth daily.        Marland Kitchen gabapentin (NEURONTIN) 100 MG capsule TAKE TWO CAPSULES BY MOUTH THREE TIMES DAILY  180 capsule  1  . glucose blood (FREESTYLE LITE) test strip 1 each by Other route 2 (two) times daily. And lancets 250.01  100 each  12  . insulin glargine (LANTUS) 100 UNIT/ML injection Inject 20 Units into the skin at bedtime.  10 mL  11  . Insulin Syringe-Needle U-100 31G X 5/16" 0.5 ML MISC 1 Syringe by Does not apply route 4 (four) times daily.  100 each  11  . isosorbide mononitrate (IMDUR) 30 MG 24 hr tablet Take 2 tablets (60 mg total) by mouth daily.  60 tablet  3  . lactulose (CHRONULAC) 10 GM/15ML solution Take 1 and 1/2 tablespoons two times a day  4527 mL   5  . metoprolol (LOPRESSOR) 25 MG tablet 1/2 tablet by mouth two times a day       . NOVOLOG 100 UNIT/ML injection Inject 42 Units into the skin 3 (three) times daily before meals.       Marland Kitchen omeprazole (PRILOSEC) 20 MG capsule Take 20 mg by mouth daily.        . penicillin v potassium (VEETID) 250 MG tablet Take 250 mg by mouth daily.        . polyethylene glycol powder (GLYCOLAX/MIRALAX) powder Take 1- 1 1/2 capful mixed in liquid of your choice bid  527 g  6  . predniSONE (DELTASONE) 5 MG tablet Take 5 mg by mouth daily.        Marland Kitchen spironolactone (ALDACTONE) 50 MG tablet Take 1 tablet (50 mg total) by mouth daily.  30 tablet  3  . Thiamine HCl (VITAMIN B-1) 250 MG tablet 1/2 tablet by mouth once daily       . traMADol (ULTRAM) 50 MG tablet Take 50 mg by mouth every 6 (six) hours as needed. Maximum 100 mg a day      . vitamin E 400 UNIT capsule Take 400 Units by mouth daily.        Marland Kitchen XIFAXAN 550 MG TABS TAKE 1 TABLET TWICE DAILY  180 tablet  0    Allergies  Allergen Reactions  . Aspirin     REACTION: unable to tolerate due to cirrhosis and varices  . Codeine   . Ezetimibe     REACTION: LEG/JOINT PAIN  . Hydromorphone Hcl   . BJY:[id number]+MVHQIONGE+XBMWUXLKGM Acid+Aspartame   . Meperidine Hcl   . Morphine   . Morphine Sulfate     REACTION: unspecified  . Oxycodone     hallucinations  . Shellfish-Derived Products     rash  . Suboxone Other (See Comments)    unresponsive  . Sulfonamide Derivatives     Family History  Problem Relation Age of Onset  . Alzheimer's disease Mother   . Cancer Father     GI (stomach?)  . Heart disease Sister     2  sisters  . Liver disease Sister   . Diabetes Brother   . Heart disease Brother     3 brothers  . Liver disease Brother   . Heart attack Other     sibling  . Cancer Other     melanoma (sibling)  . Stroke Other     sibling    BP 122/68  Pulse 58  Temp(Src) 97.7 F (36.5 C) (Oral)  Ht 6\' 4"  (1.93 m)  Wt 258 lb 3.2 oz  (117.119 kg)  BMI 31.43 kg/m2  SpO2 97%    Review of Systems Denies loc.  Right leg rash (cellulitis) is better recently)    Objective:   Physical Exam VITAL SIGNS:  See vs page GENERAL: no distress.  Pulses: dorsalis pedis intact bilat.   Feet: no deformity.  no ulcer on the feet.  feet are of normal color and temp.  There is 1+ right leg edema, and trace on the left.  There is bilteral onychomycosis. Neuro: sensation is intact to touch on the feet, but decreased from normal.  Lab Results  Component Value Date   HGBA1C 8.5* 11/01/2010  b-12=normal    Assessment & Plan:  Paresthesias, prob related to dm Dm, needs increased rx.  This a1c was also prob affected by steroid injection

## 2010-11-01 NOTE — Patient Instructions (Addendum)
blood tests are being ordered for you today.  please call [phone] to hear your test results.  You will be prompted to enter the 9-digit "MRN" number that appears at the top left of this page, followed by #.  Then you will hear the message. pending the test results, please change the novolog to 40 units with breakfast, 35 with lunch, and 50 with the evening meal check your blood sugar 2 times a day.  vary the time of day when you check, between before the 3 meals, and at bedtime.  also check if you have symptoms of your blood sugar being too high or too low.  please keep a record of the readings and bring it to your next appointment here.  please call us sooner if you are having low blood sugar episodes.  It is very important to vary the time of day hen you check, including at bedtime.   The elevated blood sugar from the steroid injection will improve soon, but until then, take 10 extra units of novolog every time the blood sugar is over 250. (update: i left message on phone-tree:  rx as we discussed).

## 2010-11-04 ENCOUNTER — Telehealth: Payer: Self-pay | Admitting: Internal Medicine

## 2010-11-04 NOTE — Telephone Encounter (Signed)
Talked to [name] she stated that after she called the medication arrived in the morning mail.

## 2010-11-05 ENCOUNTER — Telehealth: Payer: Self-pay | Admitting: Pulmonary Disease

## 2010-11-05 NOTE — Telephone Encounter (Signed)
I informed pt spouse of RA's findings and recommendations. She verbalized understanding

## 2010-11-05 NOTE — Telephone Encounter (Signed)
Download 8/12 >> good ocmpliance, on 12 cm, no leak

## 2010-11-08 ENCOUNTER — Other Ambulatory Visit: Payer: Self-pay | Admitting: Infectious Diseases

## 2010-11-08 DIAGNOSIS — T83598A Infection and inflammatory reaction due to other prosthetic device, implant and graft in urinary system, initial encounter: Secondary | ICD-10-CM

## 2010-11-10 LAB — PREPARE PLATELET PHERESIS

## 2010-11-12 ENCOUNTER — Encounter: Payer: Self-pay | Admitting: Pulmonary Disease

## 2010-11-12 LAB — COMPREHENSIVE METABOLIC PANEL
AST: 33
Albumin: 3.6
Calcium: 9.2
Creatinine, Ser: 1.04
GFR calc Af Amer: >60
GFR calc non Af Amer: >60

## 2010-11-19 ENCOUNTER — Encounter: Payer: Self-pay | Admitting: Pulmonary Disease

## 2010-11-19 ENCOUNTER — Ambulatory Visit (INDEPENDENT_AMBULATORY_CARE_PROVIDER_SITE_OTHER): Payer: Medicare Other | Admitting: Pulmonary Disease

## 2010-11-19 VITALS — BP 140/72 | HR 72 | Temp 98.3°F | Ht 76.0 in | Wt 264.4 lb

## 2010-11-19 DIAGNOSIS — G4730 Sleep apnea, unspecified: Secondary | ICD-10-CM

## 2010-11-19 NOTE — Patient Instructions (Signed)
You are doing well on CPAP - change to fixed pr of 12 cm

## 2010-11-19 NOTE — Assessment & Plan Note (Addendum)
Mild OSA 8/12 AHI 10/h Change to fixed pr 12 cm Weight loss encouraged, compliance with goal of at least 4-6 hrs every night is the expectation. Advised against medications with sedative side effects Cautioned against driving when sleepy - understanding that sleepiness will vary on a day to day basis

## 2010-11-19 NOTE — Progress Notes (Signed)
  Subjective:    Patient ID: [name], male    DOB: 01-19-1936, 75 y.o.   MRN: [id number]  HPI  74/M for FU of obstructive sleep apnea & excessive somnolence.  He has ETOH related cirrhosis, sober x 2 yrs, Last ammonia level is 1 in march' 12, On chronic steroids for joint pain, chronic bipedal edema. Initial sleep study '01 , Dr Delford Field got him a cPAP machine  He used to wear a CPAP all the time , not worn it in 6 months  Had melanoma resected from his nose 2 years ago.He was unable to use CPAP in the hospital   PSG showed mild obstructive sleep apnea , AHI about 10 times/ hour  autoCPAP 5-12 with med quattro mask Download 8/12 >> good compliance, on 12 cm, no leak  11/19/2010 Compliant with CPAP, No snoring per wife, more alert & awake daytime   Review of Systems Patient denies significant dyspnea,cough, hemoptysis,  chest pain, palpitations, pedal edema, orthopnea, paroxysmal nocturnal dyspnea, lightheadedness, nausea, vomiting, abdominal or  leg pains      Objective:   Physical Exam  Gen. Pleasant, well-nourished, in no distress ENT - no lesions, no post nasal drip Neck: No JVD, no thyromegaly, no carotid bruits Lungs: no use of accessory muscles, no dullness to percussion, clear without rales or rhonchi  Cardiovascular: Rhythm regular, heart sounds  normal, no murmurs or gallops, no peripheral edema Musculoskeletal: No deformities, no cyanosis or clubbing         Assessment & Plan:

## 2010-11-26 LAB — GLUCOSE, CAPILLARY: Glucose-Capillary: 107 mg/dL — ABNORMAL HIGH (ref 70–99)

## 2010-11-29 ENCOUNTER — Other Ambulatory Visit: Payer: Self-pay | Admitting: Internal Medicine

## 2010-11-29 NOTE — Telephone Encounter (Signed)
Do you want to refill Spironolactone?

## 2010-11-30 LAB — URINALYSIS, ROUTINE W REFLEX MICROSCOPIC
Bilirubin Urine: NEGATIVE
Glucose, UA: 100 — AB
Hgb urine dipstick: NEGATIVE
Ketones, ur: NEGATIVE
Nitrite: NEGATIVE
Protein, ur: NEGATIVE
Specific Gravity, Urine: 1.016
Urobilinogen, UA: 0.2
pH: 6

## 2010-11-30 LAB — BASIC METABOLIC PANEL
BUN: 11
CO2: 26
Calcium: 8.8
Chloride: 103
Creatinine, Ser: 0.91
GFR calc Af Amer: >60
GFR calc non Af Amer: >60
Glucose, Bld: 166 — ABNORMAL HIGH
Potassium: 3.8
Sodium: 138

## 2010-11-30 LAB — AMYLASE: Amylase: 71

## 2010-11-30 LAB — CULTURE, BLOOD (ROUTINE X 2): Culture: NO GROWTH

## 2010-11-30 LAB — DIFFERENTIAL
Basophils Absolute: 0
Basophils Relative: 0
Eosinophils Absolute: 0
Eosinophils Relative: 0
Lymphocytes Relative: 10 — ABNORMAL LOW
Lymphs Abs: 0.7
Monocytes Absolute: 0.5
Monocytes Relative: 8
Neutro Abs: 5.3
Neutrophils Relative %: 82 — ABNORMAL HIGH

## 2010-11-30 LAB — CBC
HCT: 35.9 — ABNORMAL LOW
Hemoglobin: 12.2 — ABNORMAL LOW
MCHC: 34
MCV: 95.9
Platelets: 64 — ABNORMAL LOW
RBC: 3.74 — ABNORMAL LOW
RDW: 15.1 — ABNORMAL HIGH
WBC: 6.5

## 2010-11-30 LAB — HEPATIC FUNCTION PANEL
ALT: 25
Alkaline Phosphatase: 76

## 2010-11-30 LAB — POCT CARDIAC MARKERS: Operator id: 257131

## 2010-11-30 LAB — LIPASE, BLOOD: Lipase: 25

## 2010-11-30 LAB — HEPARIN LEVEL (UNFRACTIONATED): Heparin Unfractionated: <0.1 — ABNORMAL LOW

## 2010-11-30 NOTE — Telephone Encounter (Signed)
Ok to refill 

## 2010-12-01 ENCOUNTER — Telehealth: Payer: Self-pay | Admitting: Pulmonary Disease

## 2010-12-01 NOTE — Telephone Encounter (Signed)
Auto download 9/4-10/1/12  -good compliance Residual AHI 9/h on avg pr 10.4 12 cm should be ok

## 2010-12-06 ENCOUNTER — Encounter: Payer: Self-pay | Admitting: Pulmonary Disease

## 2010-12-14 ENCOUNTER — Telehealth: Payer: Self-pay | Admitting: Pulmonary Disease

## 2010-12-14 DIAGNOSIS — G4730 Sleep apnea, unspecified: Secondary | ICD-10-CM

## 2010-12-14 NOTE — Telephone Encounter (Signed)
lecretia returned call. [phone]. [name]

## 2010-12-14 NOTE — Telephone Encounter (Signed)
lmomtcb for [employer]

## 2010-12-14 NOTE — Telephone Encounter (Signed)
LMOMTCB x 1 

## 2010-12-15 ENCOUNTER — Other Ambulatory Visit: Payer: Self-pay | Admitting: Infectious Diseases

## 2010-12-16 NOTE — Telephone Encounter (Signed)
Spoke with pt & told him he needs to make an appt. We cannot keep filling med without a check on him. Last visit was 3/12. I transferred him to the front for an appt

## 2010-12-17 NOTE — Telephone Encounter (Signed)
lmomtcb  

## 2010-12-20 ENCOUNTER — Encounter: Payer: Self-pay | Admitting: Infectious Diseases

## 2010-12-20 ENCOUNTER — Other Ambulatory Visit: Payer: Self-pay | Admitting: *Deleted

## 2010-12-20 ENCOUNTER — Ambulatory Visit (INDEPENDENT_AMBULATORY_CARE_PROVIDER_SITE_OTHER): Payer: Medicare Other | Admitting: Infectious Diseases

## 2010-12-20 DIAGNOSIS — L02419 Cutaneous abscess of limb, unspecified: Secondary | ICD-10-CM

## 2010-12-20 DIAGNOSIS — L03119 Cellulitis of unspecified part of limb: Secondary | ICD-10-CM

## 2010-12-20 MED ORDER — PENICILLIN V POTASSIUM 250 MG PO TABS: 250.0000 mg | ORAL_TABLET | Freq: Every day | ORAL | Status: DC

## 2010-12-20 NOTE — Telephone Encounter (Signed)
I spoke with [name] and she states the pt called them and stated that he felt the pressure was too strong and too much was coming out so he is requesting the pressure be decreased to 10. Please advise if ok to send order to decrease pressure. Thanks. Carron Curie, CMA

## 2010-12-20 NOTE — Progress Notes (Signed)
  Subjective:    Patient ID: [name], male    DOB: 09-30-1935, 75 y.o.   MRN: [id number]  HPI 75 yo M with hx of DM2, CABG, recurrent LE cullulitis, and alcoholic chirrhosis s/p tips. He was adm to [facility] hospital 10-23-09 to 10-28-09 for fever, nausea, vomiting, chills, right lower extremity cellulitis. He had BCx drawn on adm and was found to have 1/2 + for Pseudomonas aeruginosa (pan-sens). He was d/c home on IV levaquin. Had 4 episodes of LE cellulitis 2010.  He was seen in ID clinic for f/u and was started on oral Pen VK for prophylaxis of his recurrent episodes of cellulitis. He was given topical rx for his onychomycosis (terbinafine too high risk given his cirrhosis).  He was admitted to the hospital 04-17-10 to 04-20-10 for hepatic encephalopathy and pneumonia. He was treated with lactulose, rifaximin, avelox and improved. His LE wounds were eval by wound care in the hospital. Has been developing large blisters on his heels. He was eval by wound care 3-14 as outpt, had local debridement. Today states he has been doing well. Has been f/u at wound care center and his wounds on his feet are resolved. He has had some sore, red spots on his L foot. Has had increasing soreness for the last 2 weeks. Has been at the beach for the last 2 weeks. Has had more swelling in his feet since at beach, has been sleeping with 2 pillows under his foot. His sugars have been running high after a recent prednisone pack. Wife states that his low Glc are more common than high.    Review of Systems  Constitutional: Negative for appetite change.  Neurological: Positive for headaches.       Objective:   Physical Exam  Musculoskeletal:       Feet:  Neurological: A sensory deficit is present.       Decreased light touch bilaterally.           Assessment & Plan:

## 2010-12-20 NOTE — Assessment & Plan Note (Signed)
Will continue him on his prophylatic PEN and watch. He will rtc as needed for worsening cellulitis.

## 2010-12-20 NOTE — Telephone Encounter (Signed)
Ok to revret to 10 cm - pl send order

## 2010-12-20 NOTE — Assessment & Plan Note (Signed)
Appears to be variable. Will continue to f/u with Dr Quintella Reichert.

## 2010-12-21 NOTE — Telephone Encounter (Signed)
[facility] order has been placed. Thanks.

## 2010-12-21 NOTE — Telephone Encounter (Signed)
Order sned to dme to set cpap on 10cm

## 2010-12-28 ENCOUNTER — Other Ambulatory Visit: Payer: Self-pay | Admitting: Internal Medicine

## 2010-12-31 ENCOUNTER — Other Ambulatory Visit: Payer: Self-pay | Admitting: Internal Medicine

## 2010-12-31 MED ORDER — ALLOPURINOL 300 MG PO TABS: ORAL_TABLET | ORAL | Status: DC

## 2010-12-31 MED ORDER — METOPROLOL TARTRATE 25 MG PO TABS: ORAL_TABLET | ORAL | Status: DC

## 2010-12-31 NOTE — Telephone Encounter (Signed)
274.9 Uric Acid level, patient needs to schedule labs

## 2011-01-03 ENCOUNTER — Other Ambulatory Visit: Payer: Self-pay

## 2011-01-03 MED ORDER — ALLOPURINOL 300 MG PO TABS: ORAL_TABLET | ORAL | Status: DC

## 2011-01-03 NOTE — Telephone Encounter (Signed)
Patient's wife called, rx for allopurinol not  received at the pharmacy (local and mail-order) RX sent 1.) 2 week supply at local pharmacy  2.) 3 month supply at local pharmacy

## 2011-01-10 NOTE — H&P (Signed)
  [name], [name]                   ACCOUNT NO.:  [id number]  MEDICAL RECORD NO.:  [id number]           PATIENT TYPE:  I  LOCATION:  FOOT                         FACILITY:  [facility]  PHYSICIAN:  Ardath Sax, M.D.     DATE OF BIRTH:  1935-04-02  DATE OF ADMISSION:  05/05/2010 DATE OF DISCHARGE:                             HISTORY & PHYSICAL   [name] is a 75 year old obese man who has many problems including diabetes.  He has cirrhosis.  He had a coronary artery bypass graft done, and they took the vein from his right leg, and since that time, he has had a very swollen leg, but the venous studies that he has had __________.  He has ulcers bilaterally on the medial side of his heels that really look like atherosclerotic ischemic-type ulcers rather than venous, but I did debride some necrotic material off, and I think there are some areas of dermis that may try to grow back.  There was a lot of just really dead skin and necrotic material that I took off with scissors and forceps.  What we had on plans for him since he is a diabetic and cirrhotic and has heart disease and hypertension and he is on so many different medicines, we thought we would line him up for HBO. We also think he ought to have lymphedema pumps that he can use at home, and today, we put Silvadene cream on the wounds and wrapped him in Profore Lite dressings, and we will have him come back here in a week.     Ardath Sax, M.D.     PP/MEDQ  D:  05/05/2010  T:  05/06/2010  Job:  [id number]  Electronically Signed by Ardath Sax  on 01/10/2011 02:03:40 PM

## 2011-01-17 ENCOUNTER — Encounter: Payer: Self-pay | Admitting: Internal Medicine

## 2011-01-17 ENCOUNTER — Other Ambulatory Visit: Payer: Self-pay | Admitting: Internal Medicine

## 2011-01-17 ENCOUNTER — Ambulatory Visit (INDEPENDENT_AMBULATORY_CARE_PROVIDER_SITE_OTHER): Payer: Medicare Other | Admitting: Internal Medicine

## 2011-01-17 DIAGNOSIS — M109 Gout, unspecified: Secondary | ICD-10-CM

## 2011-01-17 DIAGNOSIS — M545 Low back pain: Secondary | ICD-10-CM

## 2011-01-17 LAB — POCT URINALYSIS DIPSTICK
Bilirubin, UA: NEGATIVE
Blood, UA: NEGATIVE
Glucose, UA: 100
Ketones, UA: NEGATIVE
Nitrite, UA: NEGATIVE

## 2011-01-17 MED ORDER — PENTAZOCINE-NALOXONE 50-0.5 MG PO TABS: 1.0000 | ORAL_TABLET | ORAL | Status: AC | PRN

## 2011-01-17 MED ORDER — CYCLOBENZAPRINE HCL 5 MG PO TABS: ORAL_TABLET | ORAL | Status: DC

## 2011-01-17 NOTE — Patient Instructions (Signed)
.  Share results with all MDs seen  

## 2011-01-17 NOTE — Progress Notes (Signed)
Subjective:    Patient ID: [name], male    DOB: May 18, 1935, 75 y.o.   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 He referred the patient to the chronic pain clinic. Unfortunately he had an adverse reaction to one of the pain medicines prescribed. He is now seeing Dr. Ethelene Hal is also a pain specialist and back specialist. He has an appointment 12/10. Pain radiates to: no    Impaired range of motion: due to pain  History of repetitive motion:  no  History of overuse or hyperextension:  no  History of trauma:  yes, see above   Past history of similar problem:  no   Symptoms Numbness/tingling:  yes, intermittent numbness RLE  Weakness: yes Hematuria/ pyuria/dysuria: minimal burning   Red Flags Fever:  no  Bowel/bladder dysfunction:  no  Past medical history includes 3 laminectomies and fusion remotely.      Review of Systems     Objective:   Physical Exam Gen.: appears uncomfortable, but in no acute distress. Alert, appropriate and cooperative throughout exam. Eyes: No corneal or conjunctival inflammation noted. Neck: No deformities, masses, or tenderness noted. Range of motion  decreased.  Lungs: Normal respiratory effort; chest expands symmetrically. Lungs ; minimal  Rales @ bases; no increased WOB. Heart: Normal rate and rhythm. Normal S1 and S2. No gallop, click, or rub. Grade 2.5 systolic  murmur. Abdomen: Bowel sounds normal; abdomen soft and nontender. No masses or  organomegaly .Very large ventral  Hernia noted.                                                            Musculoskeletal/extremities: Lordosis  noted of  the thoracic  spine. He has a flexion contracture of the right fifth finger. He has 1+ edema of the left leg and 2+ on  the right. He has pain in the right lumbosacral area with straight leg raising to 15. He has a great difficulty lying flat and sitting up and requires help from his wife.  Vascular:  dorsalis pedis and  posterior tibial pulses are decreased due to the edema. present. Neurologic: Alert and oriented x3. Deep tendon reflexes are decreased at the knees. He has a tremor of the right upper hand. Gait is slow and somewhat shuffling without limping.   Skin: Intact without suspicious lesions or rashes. Lymph: No cervical, axillary, or inguinal lymphadenopathy present. Psych: Mood and affect are normal. Normally interactive                                                                                        Assessment & Plan:  #1 acute low back syndrome presents  3 weeks. By history and exam no genitourinary etiology suspect. This is in the context of extensive prior neurosurgery.  Plan: An appointment with Dr. Ethelene Hal will be requested prior to 12/10. If  not possible, physical therapy will be initiated. Medication will be prescribed.

## 2011-01-18 ENCOUNTER — Other Ambulatory Visit: Payer: Medicare Other

## 2011-01-18 ENCOUNTER — Telehealth: Payer: Self-pay

## 2011-01-18 DIAGNOSIS — M545 Low back pain: Secondary | ICD-10-CM

## 2011-01-18 LAB — URIC ACID: Uric Acid, Serum: 5.1 mg/dL (ref 4.0–7.8)

## 2011-01-18 MED ORDER — CYCLOBENZAPRINE HCL 5 MG PO TABS: ORAL_TABLET | ORAL | Status: DC

## 2011-01-18 NOTE — Telephone Encounter (Signed)
Pt's wife called because pharmacy has not received flexeril Rx. Rx was phoned into [facility].  Wife also advised that pt has an appt with Dr. Ethelene Hal on Dec 6 and he thinks he will be ok until then

## 2011-01-20 ENCOUNTER — Encounter: Payer: Self-pay | Admitting: Pulmonary Disease

## 2011-01-24 ENCOUNTER — Other Ambulatory Visit: Payer: Self-pay | Admitting: Internal Medicine

## 2011-01-31 ENCOUNTER — Other Ambulatory Visit (INDEPENDENT_AMBULATORY_CARE_PROVIDER_SITE_OTHER): Payer: Medicare Other

## 2011-01-31 ENCOUNTER — Ambulatory Visit (INDEPENDENT_AMBULATORY_CARE_PROVIDER_SITE_OTHER): Payer: Medicare Other | Admitting: Endocrinology

## 2011-01-31 ENCOUNTER — Encounter: Payer: Self-pay | Admitting: Endocrinology

## 2011-01-31 VITALS — BP 136/62 | HR 57 | Temp 98.0°F | Ht 76.0 in | Wt 270.5 lb

## 2011-01-31 LAB — HEMOGLOBIN A1C: Hgb A1c MFr Bld: 8.5 % — ABNORMAL HIGH (ref 4.6–6.5)

## 2011-01-31 NOTE — Patient Instructions (Addendum)
blood tests are being ordered for you today.  please call [phone] to hear your test results.  You will be prompted to enter the 9-digit "MRN" number that appears at the top left of this page, followed by #.  Then you will hear the message. pending the test results, please reduce the novolog to 40 units with breakfast, 30 with lunch, and 45 with the evening meal. increase the lantus to 30 units at bedtime.  check your blood sugar 2 times a day.  vary the time of day when you check, between before the 3 meals, and at bedtime.  also check if you have symptoms of your blood sugar being too high or too low.  please keep a record of the readings and bring it to your next appointment here.  please call us sooner if you are having low blood sugar episodes.  It is very important to vary the time of day when you check, including at bedtime.

## 2011-01-31 NOTE — Progress Notes (Signed)
Subjective:    Patient ID: [name], male    DOB: 07-25-1935, 75 y.o.   MRN: [id number]  HPI Pt returns for f/u of insulin-requiring DM (1992).  pt states he feels well in general.  he brings a record of his cbg's which i have reviewed today. It varies from 70-300, but most are in the 100's.  It is most consistently high in am.   Past Medical History  Diagnosis Date  . Diverticulosis   . Gastric antral vascular ectasia   . Rhinophyma   . Melanoma     resected by [facility] 2008  . CAD (coronary artery disease)     myoview 2007 no ischemia/ no ASA because of GI disease  . Atrial fibrillation     amiodarone in past, but problems and left off meds.  . Aortic valve sclerosis     LV function...echo September 2010.Marland KitchenEF 55%...mild AI  . Visual loss, one eye     right eye-ophthalmic arterial branch occlusion  . Internal hemorrhoids   . GERD (gastroesophageal reflux disease)   . Osteoarthritis   . Diabetes mellitus, type 2   . Hypertension   . Allergic rhinitis   . Anemia     multifactorial  . Cirrhosis, alcoholic   . Thrombocytopenia   . Bleeding esophageal varices   . Encephalopathy, unspecified   . Gout   . OSA (obstructive sleep apnea)     CPAP  . Short-segment Barrett's esophagus     suspected  . Venous insufficiency     Past Surgical History  Procedure Date  . Coronary artery bypass graft 2004    6 Bypass  . Total knee arthroplasty     right knee  . Esophagogastroduodenoscopy     12/08, 12/09, 2/10  . Tips procedure 2/10  . Laminectomy     x 4  . Hemorrhoid surgery   . Release scar contracture / graft repairs of hand     bilaterally   . Arm surgery     right   . Hiatal hernia repair   . Colonoscopy w/ polypectomy 02/14/2007    8 adenomas, diverticulosis, hemorrhoids/rectal varices    History   Social History  . Marital Status: Married    Spouse Name: N/A    Number of Children: N/A  . Years of Education: N/A   Occupational History  .      retired    Social History Main Topics  . Smoking status: Former Smoker -- 2.0 packs/day for 20 years    Types: Cigarettes    Quit date: 02/22/1975  . Smokeless tobacco: Never Used  . Alcohol Use: No     none since Feb 2010  . Drug Use: No  . Sexually Active: Not on file   Other Topics Concern  . Not on file   Social History Narrative   Patient does not get regular exerciseQuit smoking over 40 years ago    Current Outpatient Prescriptions on File Prior to Visit  Medication Sig Dispense Refill  . allopurinol (ZYLOPRIM) 300 MG tablet Take 1/2 tablet once daily **LABS DUE**  45 tablet  0  . amitriptyline (ELAVIL) 10 MG tablet TAKE 1 TABLET AT BEDTIME  90 tablet  1  . Ascorbic Acid (VITAMIN C) 500 MG tablet Take 500 mg by mouth daily.        . Calcium-Magnesium-Zinc (CALCIUM/MAGNESIUM/ZINC FORMULA) 1000-500-50 MG TABS Take 1 tablet by mouth daily.        . cholecalciferol (VITAMIN D) 1000  UNITS tablet Two tablets by mouth once daily       . cyclobenzaprine (FLEXERIL) 5 MG tablet 1-2 at bedtime as needed  20 tablet  0  . diltiazem (CARDIZEM CD) 180 MG 24 hr capsule Take 180 mg by mouth daily.        . ferrous sulfate 325 (65 FE) MG tablet Take 325 mg by mouth 2 (two) times daily.        . furosemide (LASIX) 40 MG tablet Take 40 mg by mouth daily.        Marland Kitchen gabapentin (NEURONTIN) 100 MG capsule TAKE TWO CAPSULES BY MOUTH THREE TIMES DAILY  180 capsule  1  . glucose blood (FREESTYLE LITE) test strip 1 each by Other route 2 (two) times daily. And lancets 250.01  100 each  12  . Insulin Syringe-Needle U-100 31G X 5/16" 0.5 ML MISC 1 Syringe by Does not apply route 4 (four) times daily.  100 each  11  . isosorbide mononitrate (IMDUR) 30 MG 24 hr tablet Take 2 tablets (60 mg total) by mouth daily.  60 tablet  3  . lactulose (CHRONULAC) 10 GM/15ML solution Take 1 and 1/2 tablespoons two times a day  4527 mL  5  . metoprolol tartrate (LOPRESSOR) 25 MG tablet 1/2 tablet by mouth two times a day  90 tablet   1  . NOVOLOG 100 UNIT/ML injection Inject into the skin 3 (three) times daily before meals. 40 units with breakfast, 35 units with lunch, and 50 units with evening meal.      . omeprazole (PRILOSEC) 20 MG capsule Take 20 mg by mouth daily.        . penicillin v potassium (VEETID) 250 MG tablet Take 1 tablet (250 mg total) by mouth daily.  90 tablet  3  . predniSONE (DELTASONE) 5 MG tablet Take 5 mg by mouth daily.        Marland Kitchen spironolactone (ALDACTONE) 50 MG tablet TAKE ONE TABLET BY MOUTH EVERY DAY  30 tablet  3  . Thiamine HCl (VITAMIN B-1) 250 MG tablet 1/2 tablet by mouth once daily       . vitamin E 400 UNIT capsule Take 400 Units by mouth daily.        Marland Kitchen XIFAXAN 550 MG TABS TAKE 1 TABLET TWICE DAILY  180 tablet  1    Allergies  Allergen Reactions  . Aspirin     REACTION: unable to tolerate due to cirrhosis and varices  . Codeine   . Ezetimibe     REACTION: LEG/JOINT PAIN  . Hydromorphone Hcl   . ZOX:[id number]+[id number] Acid+Aspartame   . Meperidine Hcl   . Morphine   . Morphine Sulfate     REACTION: unspecified  . Oxycodone     hallucinations  . Shellfish-Derived Products     rash  . Suboxone Other (See Comments)    unresponsive  . Sulfonamide Derivatives     Family History  Problem Relation Age of Onset  . Alzheimer's disease Mother   . Cancer Father     GI (stomach?)  . Heart disease Sister     2 sisters  . Liver disease Sister   . Diabetes Brother   . Heart disease Brother     3 brothers  . Liver disease Brother   . Heart attack Other     sibling  . Cancer Other     melanoma (sibling)  . Stroke Other     sibling    BP  136/62  Pulse 57  Temp(Src) 98 F (36.7 C) (Oral)  Ht 6\' 4"  (1.93 m)  Wt 270 lb 8 oz (122.698 kg)  BMI 32.93 kg/m2  SpO2 95%    Review of Systems denies hypoglycemia.    Objective:   Physical Exam VITAL SIGNS:  See vs page GENERAL: no distress SKIN:  Insulin injection sites at the anterior abdomen are  normal       Assessment & Plan:  DM.  The pattern of his cbg's indicates he needs some adjustment in his therapy

## 2011-02-07 ENCOUNTER — Other Ambulatory Visit: Payer: Self-pay | Admitting: Endocrinology

## 2011-02-11 ENCOUNTER — Ambulatory Visit (INDEPENDENT_AMBULATORY_CARE_PROVIDER_SITE_OTHER): Payer: Medicare Other | Admitting: Internal Medicine

## 2011-02-11 ENCOUNTER — Encounter: Payer: Self-pay | Admitting: Internal Medicine

## 2011-02-11 ENCOUNTER — Other Ambulatory Visit (INDEPENDENT_AMBULATORY_CARE_PROVIDER_SITE_OTHER): Payer: Medicare Other

## 2011-02-11 VITALS — BP 132/60 | HR 58 | Ht 76.0 in | Wt 279.0 lb

## 2011-02-11 DIAGNOSIS — K7030 Alcoholic cirrhosis of liver without ascites: Secondary | ICD-10-CM

## 2011-02-11 DIAGNOSIS — K7682 Hepatic encephalopathy: Secondary | ICD-10-CM

## 2011-02-11 DIAGNOSIS — D509 Iron deficiency anemia, unspecified: Secondary | ICD-10-CM

## 2011-02-11 DIAGNOSIS — K7290 Hepatic failure, unspecified without coma: Secondary | ICD-10-CM

## 2011-02-11 LAB — FERRITIN: Ferritin: 51 ng/mL (ref 22.0–322.0)

## 2011-02-11 LAB — CBC WITH DIFFERENTIAL/PLATELET
Eosinophils Relative: 0 % (ref 0.0–5.0)
HCT: 36.5 % — ABNORMAL LOW (ref 39.0–52.0)
Lymphs Abs: 1.4 10*3/uL (ref 0.7–4.0)
Monocytes Relative: 8.4 % (ref 3.0–12.0)
Platelets: 54 10*3/uL — ABNORMAL LOW (ref 150.0–400.0)
WBC: 5.8 10*3/uL (ref 4.5–10.5)

## 2011-02-11 LAB — COMPREHENSIVE METABOLIC PANEL
Albumin: 3.6 g/dL (ref 3.5–5.2)
CO2: 26 mEq/L (ref 19–32)
Calcium: 9.2 mg/dL (ref 8.4–10.5)
Chloride: 105 mEq/L (ref 96–112)
GFR: 64.3 mL/min (ref >60.00)
Glucose, Bld: 214 mg/dL — ABNORMAL HIGH (ref 70–99)
Potassium: 4.1 mEq/L (ref 3.5–5.1)
Sodium: 141 mEq/L (ref 135–145)
Total Bilirubin: 1.2 mg/dL (ref 0.3–1.2)
Total Protein: 6.4 g/dL (ref 6.0–8.3)

## 2011-02-11 MED ORDER — LACTULOSE 10 GM/15ML PO SOLN: ORAL | Status: DC

## 2011-02-11 MED ORDER — SPIRONOLACTONE 50 MG PO TABS: 50.0000 mg | ORAL_TABLET | Freq: Every day | ORAL | Status: DC

## 2011-02-11 NOTE — Assessment & Plan Note (Signed)
He seems okay on his Xifaxan and lactulose. No change.

## 2011-02-11 NOTE — Assessment & Plan Note (Addendum)
Stable today. No change in therapy. Routine labs will be checked. Consider hepatocellular carcinoma surveillance in the future. Presumably when he has his Doppler check of his TIPS a look at the liver I will review that next time. Given his comorbidities it may not make sense to screen.

## 2011-02-11 NOTE — Patient Instructions (Addendum)
Please go to the basement upon leaving today to have your labs done. You should take the Hoveround paper to Dr. Alwyn Ren to see if he could help you with this matter. Your prescription(s) has(have) been sent to your mail order  pharmacy  (Lactulose, Spironolactone). Return to see Dr. Leone Payor in 4 months.

## 2011-02-11 NOTE — Progress Notes (Signed)
  Subjective:    Patient ID: [name], male    DOB: 1935-10-24, 75 y.o.   MRN: [id number]  HPI Wants a scooter - back pain, burning foot pain with walking Cataract surgery coming in January - [place] to have both about 2 weeks apart No hospitalizations No significant confusion, excessive sleepy Lower extremity edema as good as it has been Right hip and/or knee will "about give away" at time. Fell 2-3 x in last few months, no injury. Using rails, shower chair, high commode - precautions taken. Had a cream prescribed for legs and is to switch to moisturizer soon - erythema much better Current Drs. 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   Review of Systems As above    Objective:   Physical Exam General:  NAD - chronically ill with cane, slow painful moblity Eyes:   anicteric Lungs:  clear Heart:  S1S2 + 3/6 sustolic ejection murur, no gallops Abdomen:  Soft, obese, diastasis recti mildly tender, + ascites BS+ Ext:   2+ edema right leg, 1+ left compression stockings on           Assessment & Plan:  He is overall relatively stable though his back pain and neuropathy pain is his main problem right now. His cirrhosis appears stable I think. No major changes. His weight is going up some, I can't tell that's fluid or obesity. We'll repeat routine labs today with CBC in coverage and metabolic panel and a ferritin regarding his anemia. Have refilled lactulose and spironolactone.  I have asked them to discuss a scooter for mobility, with Dr. Alwyn Ren or perhaps Dr. Ethelene Hal.

## 2011-02-11 NOTE — Assessment & Plan Note (Addendum)
Reassess labs today. He has been on iron and has had a low-normal ferritin lately. He could have some chronic occult blood loss.

## 2011-02-14 NOTE — Progress Notes (Signed)
Quick Note:  Labs ok except elevated glucose No change in meds as prescribed by me ______

## 2011-02-16 ENCOUNTER — Telehealth: Payer: Self-pay | Admitting: Internal Medicine

## 2011-02-16 NOTE — Telephone Encounter (Signed)
Talked to [facility] and told them that Dr. Leone Payor gave the patient the papers and told him that he needed to get his PCP to fill the papers out.

## 2011-02-22 HISTORY — PX: CATARACT EXTRACTION, BILATERAL: SHX1313

## 2011-02-28 ENCOUNTER — Ambulatory Visit (INDEPENDENT_AMBULATORY_CARE_PROVIDER_SITE_OTHER): Payer: Medicare Other | Admitting: Internal Medicine

## 2011-02-28 ENCOUNTER — Encounter: Payer: Self-pay | Admitting: Internal Medicine

## 2011-02-28 VITALS — BP 132/64 | HR 64 | Temp 98.3°F | Ht 71.5 in | Wt 268.0 lb

## 2011-02-28 DIAGNOSIS — R269 Unspecified abnormalities of gait and mobility: Secondary | ICD-10-CM

## 2011-02-28 DIAGNOSIS — G589 Mononeuropathy, unspecified: Secondary | ICD-10-CM

## 2011-02-28 DIAGNOSIS — R5383 Other fatigue: Secondary | ICD-10-CM

## 2011-02-28 DIAGNOSIS — R531 Weakness: Secondary | ICD-10-CM

## 2011-02-28 DIAGNOSIS — M545 Low back pain: Secondary | ICD-10-CM

## 2011-02-28 NOTE — Progress Notes (Signed)
  Subjective:    Patient ID: [name], male    DOB: 1936/01/14, 76 y.o.   MRN: [id number]  HPI  He is considering obtaining a Hoveround to improve mobilization. He states he is profoundly limited because of chronic low back pain as well as pain in his hips. He has had 3 laminectomies in the lumbosacral area and one fusion.  He has had total knee replacement on the right.  His right hip is also weak and "gives way" when he tries to ambulate. He is using a cane which he purchased.  He sees Dr. Ethelene Hal for the chronic back pain.  He has the toilet seat, bars in the bathroom, lift chair, Craftmatic bed and shower seat. They have purchased all this equipment. Medicare did pay on the motor for the lift chair.  Other significant past medical history includes peripheral neuropathy. Apparently there is no past medical history of spinal stenosis    Review of Systems     Objective:   Physical Exam he rises from the chair with difficulty pushing off with his cane and using the arm of the chair. With effort; he exhibits marked tremor of the hands.  He uses the cane for support as he walks. His gait is shuffling with short steps.  Fingers revealed fusiform enlargement of the PIP joints.  He has weakness in his hands and thighs that position. This is most striking in the right thigh.  Deep tendon reflexes cannot be obtained.        Assessment & Plan:  #1 generalized weakness with gait dysfunction. Physical therapy/occupational therapy should be consulted as to the optimal equipment for his needs. He may benefit from a 4 pronged cane as well

## 2011-02-28 NOTE — Patient Instructions (Addendum)
.  Share results with Dr Ethelene Hal ; his expert opinion on equipment needs is indicated

## 2011-03-02 ENCOUNTER — Encounter: Payer: Self-pay | Admitting: Cardiology

## 2011-03-02 DIAGNOSIS — R0989 Other specified symptoms and signs involving the circulatory and respiratory systems: Secondary | ICD-10-CM | POA: Insufficient documentation

## 2011-03-02 DIAGNOSIS — R9430 Abnormal result of cardiovascular function study, unspecified: Secondary | ICD-10-CM | POA: Insufficient documentation

## 2011-03-02 DIAGNOSIS — I4891 Unspecified atrial fibrillation: Secondary | ICD-10-CM | POA: Insufficient documentation

## 2011-03-02 DIAGNOSIS — I2510 Atherosclerotic heart disease of native coronary artery without angina pectoris: Secondary | ICD-10-CM | POA: Insufficient documentation

## 2011-03-03 ENCOUNTER — Ambulatory Visit (INDEPENDENT_AMBULATORY_CARE_PROVIDER_SITE_OTHER): Payer: Medicare Other | Admitting: Cardiology

## 2011-03-03 ENCOUNTER — Encounter: Payer: Self-pay | Admitting: Cardiology

## 2011-03-03 DIAGNOSIS — I359 Nonrheumatic aortic valve disorder, unspecified: Secondary | ICD-10-CM

## 2011-03-03 DIAGNOSIS — I10 Essential (primary) hypertension: Secondary | ICD-10-CM

## 2011-03-03 DIAGNOSIS — I2510 Atherosclerotic heart disease of native coronary artery without angina pectoris: Secondary | ICD-10-CM

## 2011-03-03 DIAGNOSIS — I358 Other nonrheumatic aortic valve disorders: Secondary | ICD-10-CM

## 2011-03-03 NOTE — Assessment & Plan Note (Signed)
Blood pressure is stable. No change in therapy. I'll see him back in one year.

## 2011-03-03 NOTE — Assessment & Plan Note (Signed)
Aortic valve sclerosis seems stable. The last echo was in 2010. I doubt that there has been a major change. I've chosen not to proceed with a followup echo at this time.

## 2011-03-03 NOTE — Assessment & Plan Note (Signed)
Coronary disease is stable. No change in therapy. 

## 2011-03-03 NOTE — Patient Instructions (Signed)
Your physician recommends that you schedule a follow-up appointment in: 12 months.  

## 2011-03-03 NOTE — Progress Notes (Signed)
HPI Patient is seen today to followup coronary disease and systolic murmur. He is stable. He's not having any chest pain. He has not had syncope or presyncope.  Allergies  Allergen Reactions  . Aspirin     REACTION: unable to tolerate due to cirrhosis and varices  . Codeine   . Ezetimibe     REACTION: LEG/JOINT PAIN  . Hydromorphone Hcl   . EXB:[id number] Acid+Aspartame   . Meperidine Hcl   . Morphine   . Morphine Sulfate     REACTION: unspecified  . Oxycodone     hallucinations  . Shellfish-Derived Products     rash  . Suboxone Other (See Comments)    unresponsive  . Sulfonamide Derivatives     Current Outpatient Prescriptions  Medication Sig Dispense Refill  . allopurinol (ZYLOPRIM) 300 MG tablet Take 1/2 tablet once daily **LABS DUE**  45 tablet  0  . amitriptyline (ELAVIL) 10 MG tablet        . Ascorbic Acid (VITAMIN C) 500 MG tablet Take 500 mg by mouth daily.        . Calcium-Magnesium-Zinc (CALCIUM/MAGNESIUM/ZINC FORMULA) 1000-500-50 MG TABS Take 1 tablet by mouth daily.        . cholecalciferol (VITAMIN D) 1000 UNITS tablet Two tablets by mouth once daily       . cyclobenzaprine (FLEXERIL) 5 MG tablet 1-2 at bedtime as needed  20 tablet  0  . diltiazem (CARDIZEM CD) 180 MG 24 hr capsule Take 180 mg by mouth daily.        . ferrous sulfate 325 (65 FE) MG tablet Take 325 mg by mouth 2 (two) times daily.        . furosemide (LASIX) 40 MG tablet Take 40 mg by mouth daily.        Marland Kitchen gabapentin (NEURONTIN) 100 MG capsule TAKE TWO CAPSULES BY MOUTH THREE TIMES DAILY  180 capsule  1  . glucose blood (FREESTYLE LITE) test strip 1 each by Other route 2 (two) times daily. And lancets 250.01  100 each  12  . insulin glargine (LANTUS) 100 UNIT/ML injection Inject 30 Units into the skin at bedtime.  10 mL  5  . Insulin Syringe-Needle U-100 31G X 5/16" 0.5 ML MISC 1 Syringe by Does not apply route 4 (four) times daily.  100 each  11  . isosorbide mononitrate  (IMDUR) 30 MG 24 hr tablet Take 2 tablets (60 mg total) by mouth daily.  60 tablet  3  . lactulose (CHRONULAC) 10 GM/15ML solution Take 1 and 1/2 tablespoons two times a day  4284 mL  3  . Loratadine (CLARITIN) 10 MG CAPS Take by mouth as directed.        . metoprolol tartrate (LOPRESSOR) 25 MG tablet 1/2 tablet by mouth two times a day  90 tablet  1  . NOVOLOG 100 UNIT/ML injection Inject into the skin 3 (three) times daily before meals. 40 units with breakfast, 35 units with lunch, and 50 units with evening meal.      . omeprazole (PRILOSEC) 20 MG capsule Take 20 mg by mouth daily.        . penicillin v potassium (VEETID) 250 MG tablet Take 1 tablet (250 mg total) by mouth daily.  90 tablet  3  . pentazocine-naloxone (TALWIN NX) 50-0.5 MG per tablet Take 1 tablet by mouth 2 (two) times daily.        . polyethylene glycol powder (GLYCOLAX/MIRALAX) powder Take 1- 1 1/2 capful mixed  in liquid of your choice as needed       . predniSONE (DELTASONE) 5 MG tablet Take 5 mg by mouth daily.        Marland Kitchen spironolactone (ALDACTONE) 50 MG tablet Take 1 tablet (50 mg total) by mouth daily.  90 tablet  3  . Thiamine HCl (VITAMIN B-1) 250 MG tablet 1/2 tablet by mouth once daily       . vitamin E 400 UNIT capsule Take 400 Units by mouth daily.        Marland Kitchen XIFAXAN 550 MG TABS TAKE 1 TABLET TWICE DAILY  180 tablet  1    History   Social History  . Marital Status: Married    Spouse Name: N/A    Number of Children: N/A  . Years of Education: N/A   Occupational History  .      retired   Social History Main Topics  . Smoking status: Former Smoker -- 2.0 packs/day for 20 years    Types: Cigarettes    Quit date: 02/22/1975  . Smokeless tobacco: Never Used  . Alcohol Use: No     none since Feb 2010  . Drug Use: No  . Sexually Active: Not on file   Other Topics Concern  . Not on file   Social History Narrative   Patient does not get regular exerciseQuit smoking over 40 years ago    Family History    Problem Relation Age of Onset  . Alzheimer's disease Mother   . Cancer Father     GI (stomach?)  . Heart disease Sister     2 sisters  . Liver disease Sister   . Diabetes Brother   . Heart disease Brother     3 brothers  . Liver disease Brother   . Heart attack Other     sibling  . Melanoma Other     sibling  . Stroke Other     sibling    Past Medical History  Diagnosis Date  . Diverticulosis   . Gastric antral vascular ectasia   . Rhinophyma   . Melanoma     resected by [facility] 2008  . CAD (coronary artery disease)     myoview 2007 no ischemia/ no ASA because of GI disease  . Atrial fibrillation     amiodarone in past, but problems and left off meds.  . Aortic valve sclerosis     echo, September, 2010, mild aortic insufficiency  . Visual loss, one eye     right eye-ophthalmic arterial branch occlusion  . Internal hemorrhoids   . GERD (gastroesophageal reflux disease)   . Osteoarthritis   . Diabetes mellitus, type 2   . Hypertension   . Allergic rhinitis   . Anemia     multifactorial  . Cirrhosis, alcoholic   . Thrombocytopenia   . Bleeding esophageal varices   . Encephalopathy, unspecified   . Gout   . OSA (obstructive sleep apnea)     CPAP  . Short-segment Barrett's esophagus     suspected  . Venous insufficiency   . Ejection fraction     EF 55%, echo, September, 2010  . Carotid bruit     Dopplers okay in the past    Past Surgical History  Procedure Date  . Coronary artery bypass graft 2004    6 Bypass  . Total knee arthroplasty     right knee  . Esophagogastroduodenoscopy 04/10/2008    esophageal varices, cardia varix  . Tips procedure  2/10  . Laminectomy     x 4  . Hemorrhoid surgery   . Release scar contracture / graft repairs of hand     bilaterally   . Arm surgery     right   . Hiatal hernia repair   . Colonoscopy w/ polypectomy 02/14/2007    adenomatous polyps, diverticulosis, internal hemorrhoids/rectal varices    ROS  Patient  denies fever, chills, headache, sweats, rash, change in vision, change in hearing, chest pain, cough, nausea vomiting, urinary symptoms. All other systems are reviewed and are negative.  PHYSICAL EXAM  Patient is stable today. He's here with his wife. He is oriented to person time and place. Affect is normal.He has mild drooping of the right eyelid. There is no jugulovenous distention. Lungs are clear. Respiratory effort is nonlabored. Cardiac exam reveals S1 and S2. There is a systolic murmur compatible this aortic valve sclerosis. Abdomen is protuberant but soft. There is no peripheral edema.  Filed Vitals:   03/03/11 0940  BP: 118/64  Pulse: 57  Height: 6\' 4"  (1.93 m)  Weight: 267 lb (121.11 kg)    EKG  EKG is done today and reviewed by me. There is mild sinus bradycardia. There is no significant QRS change.  ASSESSMENT & PLAN

## 2011-03-19 ENCOUNTER — Other Ambulatory Visit: Payer: Self-pay | Admitting: Internal Medicine

## 2011-03-30 ENCOUNTER — Telehealth: Payer: Self-pay | Admitting: Internal Medicine

## 2011-03-30 MED ORDER — ALLOPURINOL 300 MG PO TABS: ORAL_TABLET | ORAL | Status: DC

## 2011-03-30 NOTE — Telephone Encounter (Signed)
RX sent

## 2011-03-30 NOTE — Telephone Encounter (Signed)
Refill- allopurinol 300mg  tab. Take 1/2 tablet one time daily(labs due). Qty 45

## 2011-04-01 ENCOUNTER — Ambulatory Visit: Payer: Medicare Other | Attending: Physical Medicine and Rehabilitation | Admitting: Physical Therapy

## 2011-04-01 DIAGNOSIS — M545 Low back pain, unspecified: Secondary | ICD-10-CM | POA: Insufficient documentation

## 2011-04-01 DIAGNOSIS — R262 Difficulty in walking, not elsewhere classified: Secondary | ICD-10-CM | POA: Insufficient documentation

## 2011-04-01 DIAGNOSIS — M79609 Pain in unspecified limb: Secondary | ICD-10-CM | POA: Insufficient documentation

## 2011-04-01 DIAGNOSIS — IMO0001 Reserved for inherently not codable concepts without codable children: Secondary | ICD-10-CM | POA: Insufficient documentation

## 2011-04-05 ENCOUNTER — Other Ambulatory Visit: Payer: Self-pay

## 2011-04-05 MED ORDER — FUROSEMIDE 40 MG PO TABS: 40.0000 mg | ORAL_TABLET | Freq: Every day | ORAL | Status: DC

## 2011-04-05 NOTE — Telephone Encounter (Signed)
..   Requested Prescriptions   Signed Prescriptions Disp Refills  . furosemide (LASIX) 40 MG tablet 30 tablet 10    Sig: Take 1 tablet (40 mg total) by mouth daily.    Authorizing Provider: Willa Rough D    Ordering User: Christella Hartigan, [place] Wanner Judie Petit

## 2011-04-15 ENCOUNTER — Other Ambulatory Visit: Payer: Self-pay | Admitting: Cardiology

## 2011-04-15 MED ORDER — DILTIAZEM HCL ER COATED BEADS 180 MG PO CP24: 180.0000 mg | ORAL_CAPSULE | Freq: Every day | ORAL | Status: DC

## 2011-04-15 NOTE — Telephone Encounter (Signed)
Refilled medication

## 2011-04-15 NOTE — Telephone Encounter (Signed)
Pt needs this asap .

## 2011-04-26 ENCOUNTER — Other Ambulatory Visit: Payer: Self-pay | Admitting: Internal Medicine

## 2011-04-27 NOTE — Telephone Encounter (Signed)
Prescription sent to pharmacy.

## 2011-05-02 ENCOUNTER — Encounter: Payer: Self-pay | Admitting: Endocrinology

## 2011-05-02 ENCOUNTER — Other Ambulatory Visit (INDEPENDENT_AMBULATORY_CARE_PROVIDER_SITE_OTHER): Payer: Medicare Other

## 2011-05-02 ENCOUNTER — Ambulatory Visit (INDEPENDENT_AMBULATORY_CARE_PROVIDER_SITE_OTHER): Payer: Medicare Other | Admitting: Endocrinology

## 2011-05-02 VITALS — BP 110/62 | HR 62 | Temp 98.3°F | Ht 76.0 in | Wt 265.5 lb

## 2011-05-02 LAB — HEMOGLOBIN A1C: Hgb A1c MFr Bld: 8.4 % — ABNORMAL HIGH (ref 4.6–6.5)

## 2011-05-02 NOTE — Patient Instructions (Addendum)
blood tests are being ordered for you today.  please call [phone] to hear your test results.  You will be prompted to enter the 9-digit "MRN" number that appears at the top left of this page, followed by #.  Then you will hear the message. pending the test results, please reduce the novolog to 40 units with breakfast, 30 with lunch, and 50 with the evening meal. increase the lantus to 35 units at bedtime.  check your blood sugar 2 times a day.  vary the time of day when you check, between before the 3 meals, and at bedtime.  also check if you have symptoms of your blood sugar being too high or too low.  please keep a record of the readings and bring it to your next appointment here.  please call us sooner if you are having low blood sugar episodes.  It is very important to vary the time of day when you check, including at bedtime.   Please come back for a follow-up appointment in 3 months

## 2011-05-02 NOTE — Progress Notes (Signed)
Subjective:    Patient ID: [name], male    DOB: [birth year], 76 y.o.   MRN: [id number]  HPI Pt returns for f/u of insulin-requiring DM (1992).  pt states he feels well in general.  he brings a record of his cbg's which i have reviewed today.  he brings a record of his cbg's which i have reviewed today.  It varies from 82 (afternoon) to 200's (am).   Past Medical History  Diagnosis Date  . Diverticulosis   . Gastric antral vascular ectasia   . Rhinophyma   . Melanoma     resected by [facility] 2008  . CAD (coronary artery disease)     myoview 2007 no ischemia/ no ASA because of GI disease  . Atrial fibrillation     amiodarone in past, but problems and left off meds.  . Aortic valve sclerosis     echo, September, 2010, mild aortic insufficiency  . Visual loss, one eye     right eye-ophthalmic arterial branch occlusion  . Internal hemorrhoids   . GERD (gastroesophageal reflux disease)   . Osteoarthritis   . Diabetes mellitus, type 2   . Hypertension   . Allergic rhinitis   . Anemia     multifactorial  . Cirrhosis, alcoholic   . Thrombocytopenia   . Bleeding esophageal varices   . Encephalopathy, unspecified   . Gout   . OSA (obstructive sleep apnea)     CPAP  . Short-segment Barrett's esophagus     suspected  . Venous insufficiency   . Ejection fraction     EF 55%, echo, September, 2010  . Carotid bruit     Dopplers okay in the past    Past Surgical History  Procedure Date  . Coronary artery bypass graft 2004    6 Bypass  . Total knee arthroplasty     right knee  . Esophagogastroduodenoscopy 04/10/2008    esophageal varices, cardia varix  . Tips procedure 2/10  . Laminectomy     x 4  . Hemorrhoid surgery   . Release scar contracture / graft repairs of hand     bilaterally   . Arm surgery     right   . Hiatal hernia repair   . Colonoscopy w/ polypectomy 02/14/2007    adenomatous polyps, diverticulosis, internal hemorrhoids/rectal varices    History    Social History  . Marital Status: Married    Spouse Name: N/A    Number of Children: N/A  . Years of Education: N/A   Occupational History  .      retired   Social History Main Topics  . Smoking status: Former Smoker -- 2.0 packs/day for 20 years    Types: Cigarettes    Quit date: 02/22/1975  . Smokeless tobacco: Never Used  . Alcohol Use: No     none since Feb 2010  . Drug Use: No  . Sexually Active: Not on file   Other Topics Concern  . Not on file   Social History Narrative   Patient does not get regular exerciseQuit smoking over 40 years ago    Current Outpatient Prescriptions on File Prior to Visit  Medication Sig Dispense Refill  . allopurinol (ZYLOPRIM) 300 MG tablet Take 1/2 tablet once daily  45 tablet  3  . Ascorbic Acid (VITAMIN C) 500 MG tablet Take 500 mg by mouth daily.        . Calcium-Magnesium-Zinc (CALCIUM/MAGNESIUM/ZINC FORMULA) 1000-500-50 MG TABS Take 1 tablet by mouth  daily.        . cholecalciferol (VITAMIN D) 1000 UNITS tablet Two tablets by mouth once daily       . cyclobenzaprine (FLEXERIL) 5 MG tablet 1-2 at bedtime as needed  20 tablet  0  . diltiazem (CARDIZEM CD) 180 MG 24 hr capsule Take 1 capsule (180 mg total) by mouth daily.  30 capsule  5  . ferrous sulfate 325 (65 FE) MG tablet Take 325 mg by mouth 2 (two) times daily.        . furosemide (LASIX) 40 MG tablet Take 1 tablet (40 mg total) by mouth daily.  30 tablet  10  . gabapentin (NEURONTIN) 100 MG capsule TAKE TWO CAPSULES BY MOUTH THREE TIMES DAILY  180 capsule  3  . glucose blood (FREESTYLE LITE) test strip 1 each by Other route 2 (two) times daily. And lancets 250.01  100 each  12  . Insulin Syringe-Needle U-100 31G X 5/16" 0.5 ML MISC 1 Syringe by Does not apply route 4 (four) times daily.  100 each  11  . isosorbide mononitrate (IMDUR) 30 MG 24 hr tablet Take 2 tablets (60 mg total) by mouth daily.  60 tablet  3  . lactulose (CHRONULAC) 10 GM/15ML solution Take 1 and 1/2  tablespoons two times a day  4284 mL  3  . Loratadine (CLARITIN) 10 MG CAPS Take by mouth as directed.        . metoprolol tartrate (LOPRESSOR) 25 MG tablet 1/2 tablet by mouth two times a day  90 tablet  1  . NOVOLOG 100 UNIT/ML injection Inject into the skin 3 (three) times daily before meals. 40 units with breakfast, 30 units with lunch, and 50 units with evening meal.      . omeprazole (PRILOSEC) 20 MG capsule Take 20 mg by mouth daily.        . penicillin v potassium (VEETID) 250 MG tablet Take 1 tablet (250 mg total) by mouth daily.  90 tablet  3  . pentazocine-naloxone (TALWIN NX) 50-0.5 MG per tablet Take 1 tablet by mouth 2 (two) times daily.        . polyethylene glycol powder (GLYCOLAX/MIRALAX) powder Take 1- 1 1/2 capful mixed in liquid of your choice as needed       . predniSONE (DELTASONE) 5 MG tablet Take 5 mg by mouth daily.        Marland Kitchen spironolactone (ALDACTONE) 50 MG tablet Take 1 tablet (50 mg total) by mouth daily.  90 tablet  3  . Thiamine HCl (VITAMIN B-1) 250 MG tablet 1/2 tablet by mouth once daily       . vitamin E 400 UNIT capsule Take 400 Units by mouth daily.        Marland Kitchen XIFAXAN 550 MG TABS TAKE 1 TABLET TWICE DAILY  180 tablet  1  . amitriptyline (ELAVIL) 10 MG tablet          Allergies  Allergen Reactions  . Aspirin     REACTION: unable to tolerate due to cirrhosis and varices  . Codeine   . Ezetimibe     REACTION: LEG/JOINT PAIN  . Hydromorphone Hcl   . ZOX:[id number] Acid+Aspartame   . Meperidine Hcl   . Morphine   . Morphine Sulfate     REACTION: unspecified  . Oxycodone     hallucinations  . Shellfish-Derived Products     rash  . Suboxone Other (See Comments)    unresponsive  . Sulfonamide Derivatives  Family History  Problem Relation Age of Onset  . Alzheimer's disease Mother   . Cancer Father     GI (stomach?)  . Heart disease Sister     2 sisters  . Liver disease Sister   . Diabetes Brother   . Heart disease  Brother     3 brothers  . Liver disease Brother   . Heart attack Other     sibling  . Melanoma Other     sibling  . Stroke Other     sibling    BP 110/62  Pulse 62  Temp(Src) 98.3 F (36.8 C) (Oral)  Ht 6\' 4"  (1.93 m)  Wt 265 lb 8 oz (120.43 kg)  BMI 32.32 kg/m2  SpO2 95%   Review of Systems denies hypoglycemia    Objective:   Physical Exam VITAL SIGNS:  See vs page GENERAL: no distress Pulses: dorsalis pedis intact bilat.   Feet: no deformity.  no ulcer on the feet.  feet are of normal color and temp.  There is 1+ right leg edema, and trace on the left.  There is bilteral onychomycosis.  There are bilat old healed surgical (leg vein harvest) scars. Neuro: sensation is intact to touch on the feet, but decreased from normal.      Assessment & Plan:  DM.  The pattern of his cbg's indicates he needs some adjustment in his therapy

## 2011-05-05 ENCOUNTER — Other Ambulatory Visit: Payer: Self-pay

## 2011-05-05 MED ORDER — LACTULOSE 10 GM/15ML PO SOLN: ORAL | Status: DC

## 2011-05-10 ENCOUNTER — Other Ambulatory Visit: Payer: Self-pay | Admitting: *Deleted

## 2011-05-10 ENCOUNTER — Other Ambulatory Visit: Payer: Self-pay | Admitting: Endocrinology

## 2011-05-10 DIAGNOSIS — Z8679 Personal history of other diseases of the circulatory system: Secondary | ICD-10-CM

## 2011-05-10 MED ORDER — ISOSORBIDE MONONITRATE ER 30 MG PO TB24: 60.0000 mg | ORAL_TABLET | Freq: Every day | ORAL | Status: DC

## 2011-05-17 ENCOUNTER — Telehealth: Payer: Self-pay | Admitting: Internal Medicine

## 2011-05-23 NOTE — Telephone Encounter (Signed)
Prior Authorization Request has been created and faxed and we are awaiting an answer.

## 2011-06-14 ENCOUNTER — Other Ambulatory Visit: Payer: Self-pay

## 2011-06-14 MED ORDER — RIFAXIMIN 550 MG PO TABS: 550.0000 mg | ORAL_TABLET | Freq: Two times a day (BID) | ORAL | Status: DC

## 2011-06-20 ENCOUNTER — Other Ambulatory Visit (INDEPENDENT_AMBULATORY_CARE_PROVIDER_SITE_OTHER): Payer: Medicare Other

## 2011-06-20 ENCOUNTER — Ambulatory Visit (INDEPENDENT_AMBULATORY_CARE_PROVIDER_SITE_OTHER): Payer: Medicare Other | Admitting: Internal Medicine

## 2011-06-20 ENCOUNTER — Encounter: Payer: Self-pay | Admitting: Internal Medicine

## 2011-06-20 VITALS — BP 132/60 | HR 60 | Ht 76.0 in | Wt 267.0 lb

## 2011-06-20 DIAGNOSIS — K7290 Hepatic failure, unspecified without coma: Secondary | ICD-10-CM

## 2011-06-20 DIAGNOSIS — Z86010 Personal history of colonic polyps: Secondary | ICD-10-CM

## 2011-06-20 DIAGNOSIS — K7030 Alcoholic cirrhosis of liver without ascites: Secondary | ICD-10-CM

## 2011-06-20 DIAGNOSIS — D509 Iron deficiency anemia, unspecified: Secondary | ICD-10-CM

## 2011-06-20 LAB — PROTIME-INR
INR: 1.2 ratio — ABNORMAL HIGH (ref 0.8–1.0)
Prothrombin Time: 13.2 s — ABNORMAL HIGH (ref 10.2–12.4)

## 2011-06-20 LAB — COMPREHENSIVE METABOLIC PANEL
BUN: 26 mg/dL — ABNORMAL HIGH (ref 6–23)
CO2: 23 mEq/L (ref 19–32)
Calcium: 9.3 mg/dL (ref 8.4–10.5)
Chloride: 108 mEq/L (ref 96–112)
Creatinine, Ser: 1.3 mg/dL (ref 0.4–1.5)
GFR: 56.94 mL/min — ABNORMAL LOW (ref >60.00)

## 2011-06-20 LAB — CBC WITH DIFFERENTIAL/PLATELET
Basophils Relative: 0.3 % (ref 0.0–3.0)
Hemoglobin: 12.4 g/dL — ABNORMAL LOW (ref 13.0–17.0)
Lymphocytes Relative: 18.7 % (ref 12.0–46.0)
Monocytes Relative: 4.7 % (ref 3.0–12.0)
Neutro Abs: 4.4 10*3/uL (ref 1.4–7.7)
RBC: 3.77 Mil/uL — ABNORMAL LOW (ref 4.22–5.81)

## 2011-06-20 NOTE — Assessment & Plan Note (Addendum)
Discussed possibly repeating today. He and his wife have no particular opinion and said they would leave it up to me. He would not do well without complication and as his wife points out he would not likely be a surgical candidate for a cancer. He did have multiple adenomas when he last had a colonoscopy in late 2008. He says done fairly well and looks better, I think he can tolerate a colonoscopy but again would not tolerate complications well. We are considering possibly repeating this, await his lab studies. He has had a chronic iron deficiency anemia which is probably multifactorial.

## 2011-06-20 NOTE — Progress Notes (Addendum)
  Subjective:    Patient ID: [name], male    DOB: 1935/10/22, 76 y.o.   MRN: [id number]  HPI She returns for followup of alcoholic cirrhosis with hepatic encephalopathy, as well as iron deficiency anemia. He has not been seen since December 2012. There've been no major problems since then. He reports successful cataract surgery bilaterally and a successful epidural back injection in 2 weeks ago with much less back pain. He and his wife report that he's been dizzy using a new [place] tract or, mowing and planning and planting a garden. His weight is stable. His blood sugars have been less than 200 except for this morning it was over 300. He admits to eating strawberry cake last night. His wife is wondering when he is due for another check of his TIPS. It looks like it was last done November 2012 so that should be coming up, s been sent in the past she says. Lower extremities remained swollen, they have not been to read or showed signs of infection, he wears TED hose. Occasional constipation treated with MiraLax and/or increased lactulose. There has been no significant confusion though he does sleep a lot but that is unchanged.  Medications, allergies, past medical history, past surgical history, family history and social history are reviewed and updated in the EMR.  Review of Systems As above    Objective:   Physical Exam General:         NAD - chronically ill with cane, slow painful moblity Eyes:               anicteric Lungs:            clear Heart:            S1S2 + 3/6 sustolic ejection murur, no gallops Abdomen:       Soft, obese, diastasis recti mildly tender, + ascites BS+ Ext:                 2+ edema right leg, 1+ left compression stockings on Neuro: Awake and alert and oriented x 3, some tremor but no asterixis in upper extremities        Assessment & Plan:  Please see the problem oriented charting. In general he is stable and doing reasonably well despite significant  chronic medical problems. His back pain and limits his quality of life more than anything. They have done a good job of managing his encephalopathy.

## 2011-06-20 NOTE — Patient Instructions (Signed)
Your physician has requested that you go to the basement for the following lab work before leaving today: CBC, CMET, AFP, PT, Ferritin Please follow up with Dr Leone Payor in 4 months.

## 2011-06-20 NOTE — Assessment & Plan Note (Addendum)
Still on iron, was present to be multifactorial with portal hypertension changes though he is status post TIPS to that should be less. He said hemorrhoids though is not having active bleeding. He could have blood loss from colorectal neoplasia as he said polyps. Assessed labs today.

## 2011-06-20 NOTE — Assessment & Plan Note (Addendum)
Stable, reassess with labs today including pro time alpha-fetoprotein. He has a TIPS check pending we presume his wife is aware to look for that in May or June and to contact the radiologist if they do not hear. I will see him back routinely in 4 months sooner as needed.

## 2011-06-20 NOTE — Assessment & Plan Note (Addendum)
Stable continue Xifaxan and lactulose at current doses.

## 2011-06-21 NOTE — Progress Notes (Signed)
Quick Note:  Let him know that labs are ok except blood sugar was elevated (he knew it was high that day- Will see him in routine follow-up in about 4 months and revisit possible colonoscopy then  ______

## 2011-06-22 ENCOUNTER — Emergency Department (HOSPITAL_COMMUNITY)
Admission: EM | Admit: 2011-06-22 | Discharge: 2011-06-23 | Disposition: A | Discharge status: Home / Self Care | Payer: Medicare Other | Attending: Emergency Medicine | Admitting: Emergency Medicine

## 2011-06-22 ENCOUNTER — Encounter (HOSPITAL_COMMUNITY): Payer: Self-pay | Admitting: Emergency Medicine

## 2011-06-22 ENCOUNTER — Emergency Department (HOSPITAL_COMMUNITY): Payer: Medicare Other

## 2011-06-22 ENCOUNTER — Other Ambulatory Visit: Payer: Self-pay

## 2011-06-22 ENCOUNTER — Encounter: Payer: Self-pay | Admitting: Internal Medicine

## 2011-06-22 ENCOUNTER — Ambulatory Visit (INDEPENDENT_AMBULATORY_CARE_PROVIDER_SITE_OTHER): Payer: Medicare Other | Admitting: Internal Medicine

## 2011-06-22 DIAGNOSIS — R110 Nausea: Secondary | ICD-10-CM

## 2011-06-22 DIAGNOSIS — I4891 Unspecified atrial fibrillation: Secondary | ICD-10-CM | POA: Insufficient documentation

## 2011-06-22 DIAGNOSIS — R5383 Other fatigue: Secondary | ICD-10-CM

## 2011-06-22 DIAGNOSIS — I10 Essential (primary) hypertension: Secondary | ICD-10-CM | POA: Insufficient documentation

## 2011-06-22 DIAGNOSIS — R5381 Other malaise: Secondary | ICD-10-CM

## 2011-06-22 DIAGNOSIS — R0602 Shortness of breath: Secondary | ICD-10-CM | POA: Insufficient documentation

## 2011-06-22 DIAGNOSIS — R079 Chest pain, unspecified: Secondary | ICD-10-CM

## 2011-06-22 DIAGNOSIS — T887XXA Unspecified adverse effect of drug or medicament, initial encounter: Secondary | ICD-10-CM | POA: Insufficient documentation

## 2011-06-22 DIAGNOSIS — I359 Nonrheumatic aortic valve disorder, unspecified: Secondary | ICD-10-CM | POA: Insufficient documentation

## 2011-06-22 DIAGNOSIS — Z794 Long term (current) use of insulin: Secondary | ICD-10-CM | POA: Insufficient documentation

## 2011-06-22 DIAGNOSIS — R0989 Other specified symptoms and signs involving the circulatory and respiratory systems: Secondary | ICD-10-CM | POA: Insufficient documentation

## 2011-06-22 DIAGNOSIS — R259 Unspecified abnormal involuntary movements: Secondary | ICD-10-CM

## 2011-06-22 DIAGNOSIS — I2510 Atherosclerotic heart disease of native coronary artery without angina pectoris: Secondary | ICD-10-CM | POA: Insufficient documentation

## 2011-06-22 DIAGNOSIS — I517 Cardiomegaly: Secondary | ICD-10-CM | POA: Insufficient documentation

## 2011-06-22 DIAGNOSIS — G4733 Obstructive sleep apnea (adult) (pediatric): Secondary | ICD-10-CM | POA: Insufficient documentation

## 2011-06-22 DIAGNOSIS — K219 Gastro-esophageal reflux disease without esophagitis: Secondary | ICD-10-CM | POA: Insufficient documentation

## 2011-06-22 DIAGNOSIS — R0609 Other forms of dyspnea: Secondary | ICD-10-CM | POA: Insufficient documentation

## 2011-06-22 DIAGNOSIS — E119 Type 2 diabetes mellitus without complications: Secondary | ICD-10-CM | POA: Insufficient documentation

## 2011-06-22 DIAGNOSIS — M7989 Other specified soft tissue disorders: Secondary | ICD-10-CM | POA: Insufficient documentation

## 2011-06-22 DIAGNOSIS — R609 Edema, unspecified: Secondary | ICD-10-CM | POA: Insufficient documentation

## 2011-06-22 DIAGNOSIS — Z79899 Other long term (current) drug therapy: Secondary | ICD-10-CM | POA: Insufficient documentation

## 2011-06-22 DIAGNOSIS — Z951 Presence of aortocoronary bypass graft: Secondary | ICD-10-CM | POA: Insufficient documentation

## 2011-06-22 DIAGNOSIS — R251 Tremor, unspecified: Secondary | ICD-10-CM

## 2011-06-22 LAB — BASIC METABOLIC PANEL
BUN: 26 mg/dL — ABNORMAL HIGH (ref 6–23)
Calcium: 9.9 mg/dL (ref 8.4–10.5)
GFR calc Af Amer: 61 mL/min — ABNORMAL LOW (ref >90)
GFR calc non Af Amer: 52 mL/min — ABNORMAL LOW (ref >90)
Potassium: 4.2 mEq/L (ref 3.5–5.1)
Sodium: 138 mEq/L (ref 135–145)

## 2011-06-22 LAB — POCT I-STAT, CHEM 8
Creatinine, Ser: 1.4 mg/dL — ABNORMAL HIGH (ref 0.50–1.35)
Glucose, Bld: 212 mg/dL — ABNORMAL HIGH (ref 70–99)
HCT: 38 % — ABNORMAL LOW (ref 39.0–52.0)
Hemoglobin: 12.9 g/dL — ABNORMAL LOW (ref 13.0–17.0)
Potassium: 4.2 mEq/L (ref 3.5–5.1)
Sodium: 142 mEq/L (ref 135–145)
TCO2: 24 mmol/L (ref 0–100)

## 2011-06-22 LAB — CBC
MCH: 32.5 pg (ref 26.0–34.0)
MCHC: 34 g/dL (ref 30.0–36.0)
MCV: 95.7 fL (ref 78.0–100.0)
Platelets: 51 10*3/uL — ABNORMAL LOW (ref 150–400)

## 2011-06-22 LAB — TROPONIN I: Troponin I: 0.07 ng/mL — ABNORMAL HIGH (ref <0.06)

## 2011-06-22 LAB — CK TOTAL AND CKMB (NOT AT ARMC): Total CK: 33 U/L (ref 7–232)

## 2011-06-22 LAB — DIFFERENTIAL
Basophils Relative: 0 % (ref 0–1)
Eosinophils Absolute: 0 10*3/uL (ref 0.0–0.7)
Eosinophils Relative: 0 % (ref 0–5)
Neutrophils Relative %: 75 % (ref 43–77)

## 2011-06-22 LAB — D-DIMER, QUANTITATIVE: D-Dimer, Quant: 9.08 ug/mL-FEU — ABNORMAL HIGH (ref 0.00–0.48)

## 2011-06-22 MED ORDER — SODIUM CHLORIDE 0.9 % IV BOLUS (SEPSIS)
500.0000 mL | Freq: Once | INTRAVENOUS | Status: AC
  Administered 2011-06-23: 500 mL via INTRAVENOUS

## 2011-06-22 MED ORDER — IOHEXOL 350 MG/ML SOLN: 100.0000 mL | Freq: Once | INTRAVENOUS | Status: AC | PRN

## 2011-06-22 NOTE — ED Notes (Signed)
Patient complaining of chest pain.  Patient was seen by Dr. Alwyn Ren today; nurse called back with lab work results this eveninging -- patient was told that one of the blood levels is high and that they need need to go to the ED for a CT scan.  D-Dimer was 10.5.  Patient reports shortness of breath, but states this is normal for him.

## 2011-06-22 NOTE — Progress Notes (Signed)
Subjective:    Patient ID: [name], male    DOB: 09-18-1935, 76 y.o.   MRN: [id number]  HPI Although the chief complaints were listed as tremors, nausea and fatigue he now describes chest pain. This began 06/21/11 and has been constant sense as a dull discomfort in the epigastric area. It does not radiate. He's had nausea which does not definitely related to the chest pain. He has not had diaphoresis. He is not having pleuritic chest pain, cough, sputum production, or hemoptysis. He has chronic swelling of the legs with calf cramps. He also has cramps in his hands. He takes mustard for the cramps  Labs 06/20/11 and reviewed. BUN 26, glucose 265, GFR 56.94, hematocrit 37, platelet count 50,000. Last A1c was 8.5% on 01/31/11. His anemia is stable to improved; his thrombocytopenia is stable    Review of Systems He's had an intermittent  tremor since he quit drinking according to his wife. He has been worse since 4/29 to the point he has difficulty holding a cup. He feels that this is better today.  He denies dysphasia, severe dyspepsia, abdominal pain, melena or rectal bleeding. His stool is dark related to iron which she takes twice a day.  His wife describes marked hypersomnolence. He does not have severe snoring or apnea but uses a CPAP        Objective:   Physical Exam Gen.:  well-nourished in appearance. VERY flat affect but cooperative throughout exam. Head: Normocephalic without obvious abnormalities Eyes: No corneal or conjunctival inflammation noted. No icterus Mouth: Oral mucosa and oropharynx reveal no lesions or exudates, oropharyngeal crowding. Teeth in good repair. Neck: No deformities, masses, or tenderness noted.  Thyroid normal Lungs: Normal respiratory effort; chest expands symmetrically. Lungs are clear to auscultation without rales, wheezes, or increased work of breathing. Heart: Normal rate and rhythm. Normal S1 and S2. No gallop, click, or rub.Grade 2/6 systolic  murmur with bilateral carotid radiation of murmur . Abdomen: Bowel sounds normal; abdomen soft and nontender. No masses, organomegaly  noted. Very large ventral hernia present                           Musculoskeletal/extremities: Marked edema of the extremities, 2+ pitting. Right calf is visibly larger than the left. Homans sign negative bilaterally. No clubbing or cyanosis present Vascular: Dorsalis pedis and  posterior tibial pulses are nonpalpable due to edema. Carotid bradycardia and of murmur versus bruits.  Neurologic: Alert and oriented x3. Deep tendon reflexes symmetrical and normal.         Skin: Intact without suspicious lesions or rashes. Lymph: No cervical, axillary  lymphadenopathy present. Psych: Mood and affect as noted ;oriented X3                                                                                       Assessment & Plan:  #1 chest pain, atypical and it lasted for hours. Possible reflux/hernia. EKG shows no acute or ischemic changes. 1st degree AV block; LVH voltage criteria. PACs present (new)   #2 tremors; benign essential. He should avoid stimulants such as decongestants, diet  pills, nicotine, caffeine. I would hesitate to change his beta blocker to a nonselective form such as propranolol at this time  #3 fatigue, multifactorial. TSH will be checked  #4 nausea possibly related to #1

## 2011-06-22 NOTE — Patient Instructions (Signed)
The triggers for reflux  include stress; the "aspirin family" ; alcohol; peppermint; and caffeine (coffee, tea, cola, and chocolate). The aspirin family would include aspirin and the nonsteroidal agents such as ibuprofen &  Naproxen. Tylenol would not cause reflux. If having symptoms ; food & drink should be avoided for @ least 2 hours before going to bed. Increase omeprazole to twice a day until the epigastric symptoms resolve Please try to go on My Chart within the next 24 hours to allow me to release the results directly to you.

## 2011-06-22 NOTE — ED Notes (Signed)
Patient with 2 day history of chest pain.  Patient does have some shortness of breath, but has had it for a long time.  Patient states that he went to PCP office today, the called this evening with abnormal labs.  Patient's DDimer is elevated at 10.5.  Patient is in no distress at this time.

## 2011-06-23 ENCOUNTER — Telehealth: Payer: Self-pay | Admitting: *Deleted

## 2011-06-23 ENCOUNTER — Other Ambulatory Visit: Payer: Self-pay | Admitting: Internal Medicine

## 2011-06-23 DIAGNOSIS — R600 Localized edema: Secondary | ICD-10-CM

## 2011-06-23 DIAGNOSIS — R7989 Other specified abnormal findings of blood chemistry: Secondary | ICD-10-CM

## 2011-06-23 MED ORDER — IOHEXOL 350 MG/ML SOLN
100.0000 mL | Freq: Once | INTRAVENOUS | Status: AC | PRN
  Administered 2011-06-23: 100 mL via INTRAVENOUS

## 2011-06-23 NOTE — ED Provider Notes (Signed)
History     CSN: [id number]  Arrival date & time 06/22/11  2052   First MD Initiated Contact with Patient 06/22/11 2315      Chief Complaint  Patient presents with  . Chest Pain  . Abnormal Lab    (Consider location/radiation/quality/duration/timing/severity/associated sxs/prior treatment) HPI Comments: [name] is a 76 y.o. Male who has had mild chest pain for 2 days. He saw his primary care Dr. on 5/one/13; screening labs were done and returned with elevated d-dimer. He was therefore, sent to the emergency department for further evaluation. The patient has chronic right leg edema treated with TED hose bilaterally. He has chronic dyspnea on exertion. He has a tremor that comes and goes. It has been present for several months. He is any problems eating recently. He's been taking his medications regularly.  Patient is a 76 y.o. male presenting with chest pain. The history is provided by the patient and the spouse.  Chest Pain     Past Medical History  Diagnosis Date  . Diverticulosis   . Gastric antral vascular ectasia   . Rhinophyma   . Melanoma     resected by [facility] 2008  . CAD (coronary artery disease)     myoview 2007 no ischemia/ no ASA because of GI disease  . Atrial fibrillation     amiodarone in past, but problems and left off meds.  . Aortic valve sclerosis     echo, September, 2010, mild aortic insufficiency  . Visual loss, one eye     right eye-ophthalmic arterial branch occlusion  . Internal hemorrhoids   . GERD (gastroesophageal reflux disease)   . Osteoarthritis   . Diabetes mellitus, type 2   . Hypertension   . Allergic rhinitis   . Anemia     multifactorial  . Cirrhosis, alcoholic   . Thrombocytopenia   . Bleeding esophageal varices   . Encephalopathy, unspecified   . Gout   . OSA (obstructive sleep apnea)     CPAP  . Short-segment Barrett's esophagus     suspected  . Venous insufficiency   . Ejection fraction     EF 55%, echo, September,  2010  . Carotid bruit     Dopplers okay in the past    Past Surgical History  Procedure Date  . Coronary artery bypass graft 2004    6 Bypass  . Total knee arthroplasty     right knee  . Esophagogastroduodenoscopy 04/10/2008    esophageal varices, cardia varix  . Tips procedure 2/10  . Laminectomy     x 4  . Hemorrhoid surgery   . Release scar contracture / graft repairs of hand     bilaterally   . Arm surgery     right   . Hiatal hernia repair   . Colonoscopy w/ polypectomy 02/14/2007    adenomatous polyps, diverticulosis, internal hemorrhoids/rectal varices  . Cataract extraction, bilateral January 2013    Family History  Problem Relation Age of Onset  . Alzheimer's disease Mother   . Cancer Father     GI (stomach?)  . Heart disease Sister     2 sisters  . Liver disease Sister   . Diabetes Brother   . Heart disease Brother     3 brothers  . Liver disease Brother   . Heart attack Other     sibling  . Melanoma Other     sibling  . Stroke Other     sibling  History  Substance Use Topics  . Smoking status: Former Smoker -- 2.0 packs/day for 20 years    Types: Cigarettes    Quit date: 02/22/1975  . Smokeless tobacco: Never Used  . Alcohol Use: No     none since Feb 2010      Review of Systems  Cardiovascular: Positive for chest pain.  All other systems reviewed and are negative.    Allergies  Amoxicillin-pot clavulanate; Aspirin; Buprenorphine hcl-naloxone hcl; Codeine; Ezetimibe; Hydromorphone hcl; Meperidine hcl; Morphine; Morphine sulfate; Oxycodone; Shellfish-derived products; and Sulfonamide derivatives  Home Medications   Current Outpatient Rx  Name Route Sig Dispense Refill  . ALLOPURINOL 300 MG PO TABS  Take 1/2 tablet once daily 45 tablet 3  . VITAMIN C 500 MG PO TABS Oral Take 500 mg by mouth daily.      Marland Kitchen CALCIUM-MAGNESIUM-ZINC 1000-500-50 MG PO TABS Oral Take 1 tablet by mouth daily.      Marland Kitchen VITAMIN D 1000 UNITS PO TABS  Two tablets  by mouth once daily     . CYCLOBENZAPRINE HCL 5 MG PO TABS  1-2 at bedtime as needed 20 tablet 0  . DILTIAZEM HCL ER COATED BEADS 180 MG PO CP24 Oral Take 1 capsule (180 mg total) by mouth daily. 30 capsule 5  . FERROUS SULFATE 325 (65 FE) MG PO TABS Oral Take 325 mg by mouth 2 (two) times daily.      Marland Kitchen GABAPENTIN 100 MG PO CAPS  TAKE TWO CAPSULES BY MOUTH THREE TIMES DAILY 180 capsule 3  . GLUCOSE BLOOD VI STRP Other 1 each by Other route 2 (two) times daily. And lancets 250.01 100 each 12  . INSULIN ASPART 100 UNIT/ML [place] SOLN  Inject subcutaneously 40 units with breakfast, 30 units with lunch and 50 units with the evening meal 40 mL 4  . INSULIN GLARGINE 100 UNIT/ML  SOLN Subcutaneous Inject 40 Units into the skin at bedtime.     . ISOSORBIDE MONONITRATE ER 30 MG PO TB24 Oral Take 2 tablets (60 mg total) by mouth daily. 60 tablet 8  . LACTULOSE 10 GM/15ML PO SOLN  Take 1 and 1/2 tablespoons two times a day 4284 mL 3  . LORATADINE 10 MG PO CAPS Oral Take by mouth as directed.      Marland Kitchen METOPROLOL TARTRATE 25 MG PO TABS  1/2 tablet by mouth two times a day 90 tablet 1  . OMEPRAZOLE 20 MG PO CPDR Oral Take 20 mg by mouth daily.      Marland Kitchen PENICILLIN V POTASSIUM 250 MG PO TABS Oral Take 1 tablet (250 mg total) by mouth daily. 90 tablet 3  . PENTAZOCINE-NALOXONE 50-0.5 MG PO TABS Oral Take 1 tablet by mouth 2 (two) times daily.      Marland Kitchen POLYETHYLENE GLYCOL 3350 PO POWD  Take 1- 1 1/2 capful mixed in liquid of your choice as needed     . PREDNISONE 5 MG PO TABS Oral Take 5 mg by mouth daily.      Marland Kitchen RIFAXIMIN 550 MG PO TABS Oral Take 1 tablet (550 mg total) by mouth 2 (two) times daily. 180 tablet 5    Faxed signed Rx to [facility] at [phone]  . SPIRONOLACTONE 50 MG PO TABS Oral Take 1 tablet (50 mg total) by mouth daily. 90 tablet 3  . VITAMIN B-1 250 MG PO TABS  1/2 tablet by mouth once daily     . VITAMIN E 400 UNITS PO CAPS Oral Take  400 Units by mouth daily.      Marland Kitchen AMITRIPTYLINE HCL 10 MG PO  TABS       . FUROSEMIDE 40 MG PO TABS Oral Take 1 tablet (40 mg total) by mouth daily. 30 tablet 10  . INSULIN SYRINGE-NEEDLE U-100 31G X 5/16" 0.5 ML MISC Does not apply 1 Syringe by Does not apply route 4 (four) times daily. 100 each 11    BP 119/61  Pulse 61  Temp(Src) 98.4 F (36.9 C) (Oral)  Resp 12  SpO2 100%  Physical Exam  Nursing note and vitals reviewed. Constitutional: He is oriented to person, place, and time. He appears well-developed and well-nourished.       He is ill-appearing and obese  HENT:  Head: Normocephalic and atraumatic.  Right Ear: External ear normal.  Left Ear: External ear normal.  Eyes: Conjunctivae and EOM are normal. Pupils are equal, round, and reactive to light.  Neck: Normal range of motion and phonation normal. Neck supple.  Cardiovascular: Normal rate, regular rhythm, normal heart sounds and intact distal pulses.   Pulmonary/Chest: Effort normal and breath sounds normal. No respiratory distress. He has no wheezes. He exhibits no tenderness and no bony tenderness.  Abdominal: Soft. Normal appearance and bowel sounds are normal. There is no tenderness.  Musculoskeletal: Normal range of motion.       Moderate right leg swelling without tenderness  Neurological: He is alert and oriented to person, place, and time. He has normal strength. No cranial nerve deficit or sensory deficit. He exhibits normal muscle tone. Coordination normal.  Skin: Skin is warm, dry and intact.  Psychiatric: He has a normal mood and affect. His behavior is normal. Judgment and thought content normal.    ED Course  Procedures (including critical care time)   Date: 06/23/2011  Rate: 68  Rhythm: normal sinus rhythm  QRS Axis: normal  Intervals: normal  ST/T Wave abnormalities: normal  Conduction Disutrbances:none  Narrative Interpretation: LVH  Old EKG Reviewed: unchanged 03/03/11    Labs Reviewed  CBC - Abnormal; Notable for the following:    RBC 3.94 (*)     Hemoglobin 12.8 (*)    HCT 37.7 (*)    Platelets 51 (*) PLATELET COUNT CONFIRMED BY SMEAR   All other components within normal limits  BASIC METABOLIC PANEL - Abnormal; Notable for the following:    Glucose, Bld 207 (*)    BUN 26 (*)    GFR calc non Af Amer 52 (*)    GFR calc Af Amer 61 (*)    All other components within normal limits  PRO B NATRIURETIC PEPTIDE - Abnormal; Notable for the following:    Pro B Natriuretic peptide (BNP) 302.5 (*)    All other components within normal limits  POCT I-STAT, CHEM 8 - Abnormal; Notable for the following:    BUN 28 (*)    Creatinine, Ser 1.40 (*)    Glucose, Bld 212 (*)    Hemoglobin 12.9 (*)    HCT 38.0 (*)    All other components within normal limits  D-DIMER, QUANTITATIVE - Abnormal; Notable for the following:    D-Dimer, Quant 9.08 (*)    All other components within normal limits  DIFFERENTIAL  POCT I-STAT TROPONIN I   Dg Chest 2 View  06/22/2011  **ADDENDUM** CREATED: 06/22/2011 23:27:08  Discussed via telephone with Dr. Effie Shy at 11:20 p.m. on 06/22/2011.  **END ADDENDUM** SIGNED BY: Waneta Martins, M.D.    06/22/2011  *  RADIOLOGY REPORT*  Clinical Data: Mid chest pain, shortness of breath.  CHEST - 2 VIEW  Comparison: 05/10/2010  Findings: Status post median sternotomy and CABG.  Heart size upper normal limits.  Mild tortuosity and double density appearance to the aortic arch. No pneumothorax.  No pleural effusion.  No acute osseous abnormality.  IMPRESSION: Double density appearance to the aortic arch may be projectional/artifactual from the sternum. However, if there is clinical concern for aortic pathology/dissection, a chest CTA is recommended.  Otherwise, no acute process identified.  Original Report Authenticated By: Waneta Martins, M.D.   Ct Angio Chest W/cm &/or Wo Cm  06/23/2011  *RADIOLOGY REPORT*  Clinical Data: Elevated D-dimer, questionable aortic abnormality on chest x-ray.  CT ANGIOGRAPHY CHEST  Technique:  Multidetector  CT imaging of the chest using the standard protocol during bolus administration of intravenous contrast. Multiplanar reconstructed images including MIPs were obtained and reviewed to evaluate the vascular anatomy.  Contrast: OMNIPAQUE IOHEXOL 350 MG/ML SOLN  Comparison: 06/22/2011 radiograph  Findings: The aorta is tortuous, with scattered atherosclerosis. Mild ascending ectasia without and asthma dilatation or dissection. No pulmonary arterial branch filling defect.  Cardiomegaly.  Aortic valve, mitral valve, and coronary artery calcification.  No pericardial or pleural effusion.  Limited images through the upper abdomen demonstrate a cirrhotic liver morphology.  A TIPS shunt is partially imaged, the patency of which cannot be evaluated on this examination.  Splenomegaly.  Mild right apical calcified pleural plaque.  Bibasilar scarring versus atelectasis, mild.  Otherwise, no focal areas of consolidation.  No pneumothorax.  Calcified right upper lobe nodule, in keeping with sequelae of prior granulomas infection.  No suspicious nodules identified.  The flowing anterior osteophytes.  Osteopenia.  No acute osseous abnormality identified.  IMPRESSION: No pulmonary arterial filling defect identified.  Tortuous, atherosclerotic, and mildly ectatic aorta however no dissection or aneurysmal dilatation.  Cardiomegaly, aortic and mitral valve calcifications.  Coronary artery calcifications status post CABG.  Cirrhotic liver morphology with stigmata of portal hypertension. Partially imaged TIPS.  Cannot evaluate patency on this exam due to phase of contrast.  Original Report Authenticated By: Waneta Martins, M.D.     1. Chest pain   2. Leg swelling       MDM  Nonspecific chest pain, with malaise; d-dimer elevation, probably represents clot in the right calf, which is likely chronic. No indication for further evaluation in the emergency setting tonight. Doubt  PE, aortic dissection, ACS, or occult  infection,  or acute right leg DVT. Patient stable for discharge with outpatient management    Plan: Home Medications- usual; Home Treatments- rest; Recommended follow up- PCP within 1 week      Flint Melter, MD 06/23/11 [phone]

## 2011-06-23 NOTE — Discharge Instructions (Signed)
Chest Pain (Nonspecific) Chest pain has many causes. Your pain could be caused by something serious, such as a heart attack or a blood clot in the lungs. It could also be caused by something less serious, such as a chest bruise or a virus. Follow up with your doctor. More lab tests or other studies may be needed to find the cause of your pain. Most of the time, nonspecific chest pain will improve within 2 to 3 days of rest and mild pain medicine. HOME CARE  For chest bruises, you may put ice on the sore area for 15 to 20 minutes, 3 to 4 times a day. Do this only if it makes you or your child feel better.   Put ice in a plastic bag.   Place a towel between the skin and the bag.   Rest for the next 2 to 3 days.   Go back to work if the pain improves.   See your doctor if the pain lasts longer than 1 to 2 weeks.   Only take medicine as told by your doctor.   Quit smoking if you smoke.  GET HELP RIGHT AWAY IF:   There is more pain or pain that spreads to the arm, neck, jaw, back, or belly (abdomen).   You or your child has shortness of breath.   You or your child coughs more than usual or coughs up blood.   You or your child has very bad back or belly pain, feels sick to his or her stomach (nauseous), or throws up (vomits).   You or your child has very bad weakness.   You or your child passes out (faints).   You or your child has a temperature by mouth above 102 F (38.9 C), not controlled by medicine.  Any of these problems may be serious and may be an emergency. Do not wait to see if the problems will go away. Get medical help right away. Call your local emergency services 911 in [place] Do not drive yourself to the hospital. MAKE SURE YOU:   Understand these instructions.   Will watch this condition.   Will get help right away if you or your child is not doing well or gets worse.  Document Released: 07/27/2007 Document Revised: 01/27/2011 Document Reviewed:  07/27/2007 [facility] Patient Information 2012 [place], [place].Peripheral Edema You have swelling in your legs (peripheral edema). This swelling is due to excess accumulation of salt and water in your body. Edema may be a sign of heart, kidney or liver disease, or a side effect of a medication. It may also be due to problems in the leg veins. Elevating your legs and using special support stockings may be very helpful, if the cause of the swelling is due to poor venous circulation. Avoid long periods of standing, whatever the cause. Treatment of edema depends on identifying the cause. Chips, pretzels, pickles and other salty foods should be avoided. Restricting salt in your diet is almost always needed. Water pills (diuretics) are often used to remove the excess salt and water from your body via urine. These medicines prevent the kidney from reabsorbing sodium. This increases urine flow. Diuretic treatment may also result in lowering of potassium levels in your body. Potassium supplements may be needed if you have to use diuretics daily. Daily weights can help you keep track of your progress in clearing your edema. You should call your caregiver for follow up care as recommended. SEEK IMMEDIATE MEDICAL CARE IF:   You  have increased swelling, pain, redness, or heat in your legs.   You develop shortness of breath, especially when lying down.   You develop chest or abdominal pain, weakness, or fainting.   You have a fever.  Document Released: 03/17/2004 Document Revised: 01/27/2011 Document Reviewed: 02/25/2009 [facility] Patient Information 2012 [place], [place].

## 2011-06-23 NOTE — Telephone Encounter (Signed)
Pt wife called wanted to know if there is anything else they need to be doing. Pt wife notes that they went to ED and have since been release.Please advise

## 2011-06-23 NOTE — Telephone Encounter (Signed)
[facility] Triage Call Report Triage Record Num: [id number] Operator: Albertine Grates Patient Name: [name] Call Date & Time: 06/22/2011 7:39:42PM Patient Phone: [phone] PCP: Marga Melnick Caller Name: Aitan Angelino Relationship to Patient: Unknown Patient Gender: Male PCP Fax : [phone] Patient DOB: 12/13/1922 Practice Name: [facility] Reason for Call: [facility] calling- Pt. had d dimer done 5-1 and was 0.61/high. Dr. Cato Mulligan notified and advised MD to handle 5-2. Protocol(s) Used: Office Note Recommended Outcome per Protocol: Information Noted and Sent to Office Reason for Outcome: Caller information to office Care Advice:

## 2011-06-23 NOTE — Telephone Encounter (Signed)
[facility] Triage Call Report Triage Record Num: [id number] Operator: Albertine Grates Patient Name: [name] Call Date & Time: 06/22/2011 7:39:42PM Patient Phone: [phone] PCP: Marga Melnick Patient Gender: Male PCP Fax : [phone] Patient DOB: 05-Feb-1936 Practice Name: [facility] Reason for Call: Caller: Delaney Meigs; PCP: Marga Melnick; CB#: [phone]; Call regarding Delaney Meigs is calling from [place] regarding a D Dimer ordered on [name], [name] by Marga Melnick.; [facility] calling and pt. had CK Total/normal-33 and CK MB/1.7-normal, d dimer/10.52-alert high, troponin/0.07-high. Dr. Cato Mulligan notified and advised ED. Pt. notified. Advised [place]. Protocol(s) Used: Office Note Recommended Outcome per Protocol: Information Noted and Sent to Office Reason for Outcome: Caller information to office Care Advice: ~ 06/22/2011 7:59:26PM

## 2011-06-24 ENCOUNTER — Ambulatory Visit (HOSPITAL_COMMUNITY)
Admission: RE | Admit: 2011-06-24 | Discharge: 2011-06-24 | Disposition: A | Discharge status: Home / Self Care | Payer: Medicare Other | Source: Ambulatory Visit | Attending: Internal Medicine | Admitting: Internal Medicine

## 2011-06-24 ENCOUNTER — Telehealth: Payer: Self-pay | Admitting: *Deleted

## 2011-06-24 DIAGNOSIS — R609 Edema, unspecified: Secondary | ICD-10-CM

## 2011-06-24 DIAGNOSIS — R7989 Other specified abnormal findings of blood chemistry: Secondary | ICD-10-CM

## 2011-06-24 DIAGNOSIS — R791 Abnormal coagulation profile: Secondary | ICD-10-CM | POA: Insufficient documentation

## 2011-06-24 DIAGNOSIS — R600 Localized edema: Secondary | ICD-10-CM

## 2011-06-24 MED ORDER — DOXYCYCLINE HYCLATE 100 MG PO TABS: 100.0000 mg | ORAL_TABLET | Freq: Two times a day (BID) | ORAL | Status: AC

## 2011-06-24 NOTE — Telephone Encounter (Signed)
Pt wife called this morning to report that she pulled a tick off Pt today which looks like it had been there for a few days. Pt wife would like to know if this could be contributing to Pt condition.

## 2011-06-24 NOTE — Telephone Encounter (Signed)
Certainly this could be a possible factor. Doxycycline 100 mg twice a day dispense 14. Avoid sun while on this medication

## 2011-06-24 NOTE — Telephone Encounter (Signed)
[facility] called to say that the lower extremity duplex test came back negative.

## 2011-06-24 NOTE — Telephone Encounter (Signed)
Noted, forwarded to Dr.Hopper for further advice

## 2011-06-24 NOTE — Telephone Encounter (Signed)
Discuss with patient wife Rx sent.  

## 2011-06-24 NOTE — Telephone Encounter (Signed)
Her husband is aware.  

## 2011-06-24 NOTE — Progress Notes (Signed)
VASCULAR LAB PRELIMINARY  PRELIMINARY  PRELIMINARY  PRELIMINARY  Bilateral lower extremity venous duplex completed.    Preliminary report: Negative for deep and superficial vein thrombosis  bilaterally.                                                                                    Vanna Scotland,  RVT  06/24/2011, 5:42 PM

## 2011-06-25 ENCOUNTER — Other Ambulatory Visit: Payer: Self-pay | Admitting: Internal Medicine

## 2011-06-27 NOTE — Progress Notes (Signed)
VASCULAR LAB PRELIMINARY  PRELIMINARY  PRELIMINARY  PRELIMINARY  Bilateral lower extremity venous duplex has been completed.    Preliminary report:  Negative for Deep and superficial vein thrombosis.  Vanna Scotland,  RVT 06/27/2011, 11:46 AM

## 2011-06-28 ENCOUNTER — Other Ambulatory Visit: Payer: Self-pay

## 2011-06-28 ENCOUNTER — Other Ambulatory Visit: Payer: Self-pay | Admitting: Interventional Radiology

## 2011-06-28 DIAGNOSIS — Z95828 Presence of other vascular implants and grafts: Secondary | ICD-10-CM

## 2011-07-09 ENCOUNTER — Other Ambulatory Visit: Payer: Self-pay | Admitting: Internal Medicine

## 2011-07-14 ENCOUNTER — Other Ambulatory Visit: Payer: Self-pay | Admitting: Endocrinology

## 2011-07-25 ENCOUNTER — Telehealth: Payer: Self-pay | Admitting: *Deleted

## 2011-07-25 ENCOUNTER — Other Ambulatory Visit: Payer: Self-pay | Admitting: Dermatology

## 2011-07-25 NOTE — Telephone Encounter (Signed)
Discuss with patient wife 

## 2011-07-25 NOTE — Telephone Encounter (Signed)
Pt wife states that Pt has a history of cellulitis in his leg and saw the dermatologist today who feels that it is just fluid in Pt leg. Pt wife would like to know if it would be ok to increase his lasix.

## 2011-07-25 NOTE — Telephone Encounter (Signed)
Increase by one half pill for 3 days; he would need to be seen if it is not improved

## 2011-07-26 ENCOUNTER — Ambulatory Visit
Admission: RE | Admit: 2011-07-26 | Discharge: 2011-07-26 | Disposition: A | Discharge status: Home / Self Care | Payer: Medicare Other | Source: Ambulatory Visit | Attending: Internal Medicine | Admitting: Internal Medicine

## 2011-07-26 ENCOUNTER — Ambulatory Visit
Admission: RE | Admit: 2011-07-26 | Discharge: 2011-07-26 | Disposition: A | Discharge status: Home / Self Care | Payer: Medicare Other | Source: Ambulatory Visit | Attending: Interventional Radiology | Admitting: Interventional Radiology

## 2011-07-26 DIAGNOSIS — Z95828 Presence of other vascular implants and grafts: Secondary | ICD-10-CM

## 2011-07-26 NOTE — Consult Note (Signed)
  July 26, 2011  Stan Head, MD  [address] [place], [place] [address]  Re:  [name] (DOB: August 04, 2035)  Dear Dr. Leone Payor:  I had the opportunity to see our mutual patient, [name], today at his scheduled follow-up exam, three years status post TIPS creation for acute GI bleed.    Since the previous visit one year ago, he has done relatively well.  No interval hospitalizations.  No interval GI bleeding.  He does have occasional problem with mild hepatic encephalopathy related to elevated ammonia levels which he and his wife are able to generally deal with at home with titration of his Lactulose and laxative medication.  Otherwise, his energy is generally good, and he is primarily limited by his arthritis in his back and extremities.    On exam, he remains of normal mood and affect.  He is afebrile, pulse 59, respirations 17, blood pressure 141/66.  O2 sats are 99% on room air.    His abdominal vascular Doppler today, reported separately, shows stable velocities through a patent TIPS with no evidence of ascites, varices or other complicating features.   My impression is that he is doing well three years status post TIPS creation for acute GI bleeding secondary to portal hypertension and cirrhosis.  I will plan to see him back in one year for routine ultrasound surveillance follow-up.  He and his wife know to telephone should they have any interval questions or problems with respect to the TIPS.  He will also plan to follow-up with you, as scheduled.  Thank you for allowing [place] to participate in the care of this pleasant patient. I will keep you updated with his progress.  Sincerely,    Oley Balm, MD  [facility]

## 2011-07-27 ENCOUNTER — Telehealth: Payer: Self-pay

## 2011-07-27 DIAGNOSIS — IMO0001 Reserved for inherently not codable concepts without codable children: Secondary | ICD-10-CM

## 2011-07-27 NOTE — Telephone Encounter (Signed)
Pt's spouse called requesting Rx to [facility] for pt to be evaluated for new DM shoes. Fax [phone]

## 2011-07-27 NOTE — Telephone Encounter (Signed)
Pt's spouse informed of order. [facility].

## 2011-07-27 NOTE — Telephone Encounter (Signed)
done

## 2011-08-01 ENCOUNTER — Telehealth: Payer: Self-pay | Admitting: *Deleted

## 2011-08-01 ENCOUNTER — Ambulatory Visit (INDEPENDENT_AMBULATORY_CARE_PROVIDER_SITE_OTHER): Payer: Medicare Other | Admitting: Endocrinology

## 2011-08-01 ENCOUNTER — Other Ambulatory Visit (INDEPENDENT_AMBULATORY_CARE_PROVIDER_SITE_OTHER): Payer: Medicare Other

## 2011-08-01 ENCOUNTER — Encounter: Payer: Self-pay | Admitting: Endocrinology

## 2011-08-01 VITALS — BP 138/72 | HR 63 | Temp 97.1°F | Ht 76.0 in | Wt 270.0 lb

## 2011-08-01 DIAGNOSIS — E1059 Type 1 diabetes mellitus with other circulatory complications: Secondary | ICD-10-CM

## 2011-08-01 DIAGNOSIS — I798 Other disorders of arteries, arterioles and capillaries in diseases classified elsewhere: Secondary | ICD-10-CM

## 2011-08-01 DIAGNOSIS — E119 Type 2 diabetes mellitus without complications: Secondary | ICD-10-CM | POA: Insufficient documentation

## 2011-08-01 MED ORDER — GLUCOSE BLOOD VI STRP: 1.0000 | ORAL_STRIP | Freq: Two times a day (BID) | Status: DC

## 2011-08-01 MED ORDER — INSULIN GLARGINE 100 UNIT/ML ~~LOC~~ SOLN: 40.0000 [IU] | Freq: Every day | SUBCUTANEOUS | Status: DC

## 2011-08-01 MED ORDER — INSULIN ASPART 100 UNIT/ML ~~LOC~~ SOLN: SUBCUTANEOUS | Status: DC

## 2011-08-01 NOTE — Patient Instructions (Addendum)
blood tests are being requested for you today.  You will receive a letter with results. pending the test results, please continue the novolog: 40 units with breakfast, 30 with lunch, and 50 with the evening meal.  However, take 5 extra units whenever the blood sugar is over 200.  continue the lantus, 35 units at bedtime.  check your blood sugar 2 times a day.  vary the time of day when you check, between before the 3 meals, and at bedtime.  also check if you have symptoms of your blood sugar being too high or too low.  please keep a record of the readings and bring it to your next appointment here.  please call us sooner if you are having low blood sugar episodes.  It is very important to vary the time of day when you check, including at bedtime.   Please come back for a follow-up appointment in 3 months.

## 2011-08-01 NOTE — Progress Notes (Signed)
Subjective:    Patient ID: [name], male    DOB: Nov 09, 1935, 76 y.o.   MRN: [id number]  HPI Pt returns for f/u of insulin-requiring DM (dx'ed 1992; complicated by peripheral sensory neuropathy, PAD, and CAD).  pt states he feels well in general, except for chronic arthralgias.  he brings a record of his cbg's which i have reviewed today.  It varies from 93-300.  There is no trend throughout the day. Past Medical History  Diagnosis Date  . Diverticulosis   . Gastric antral vascular ectasia   . Rhinophyma   . Melanoma     resected by [facility] 2008  . CAD (coronary artery disease)     myoview 2007 no ischemia/ no ASA because of GI disease  . Atrial fibrillation     amiodarone in past, but problems and left off meds.  . Aortic valve sclerosis     echo, September, 2010, mild aortic insufficiency  . Visual loss, one eye     right eye-ophthalmic arterial branch occlusion  . Internal hemorrhoids   . GERD (gastroesophageal reflux disease)   . Osteoarthritis   . Diabetes mellitus, type 2   . Hypertension   . Allergic rhinitis   . Anemia     multifactorial  . Cirrhosis, alcoholic   . Thrombocytopenia   . Bleeding esophageal varices   . Encephalopathy, unspecified   . Gout   . OSA (obstructive sleep apnea)     CPAP  . Short-segment Barrett's esophagus     suspected  . Venous insufficiency   . Ejection fraction     EF 55%, echo, September, 2010  . Carotid bruit     Dopplers okay in the past    Past Surgical History  Procedure Date  . Coronary artery bypass graft 2004    6 Bypass  . Total knee arthroplasty     right knee  . Esophagogastroduodenoscopy 04/10/2008    esophageal varices, cardia varix  . Tips procedure 2/10  . Laminectomy     x 4  . Hemorrhoid surgery   . Release scar contracture / graft repairs of hand     bilaterally   . Arm surgery     right   . Hiatal hernia repair   . Colonoscopy w/ polypectomy 02/14/2007    adenomatous polyps, diverticulosis,  internal hemorrhoids/rectal varices  . Cataract extraction, bilateral January 2013    History   Social History  . Marital Status: Married    Spouse Name: N/A    Number of Children: N/A  . Years of Education: N/A   Occupational History  .      retired   Social History Main Topics  . Smoking status: Former Smoker -- 2.0 packs/day for 20 years    Types: Cigarettes    Quit date: 02/22/1975  . Smokeless tobacco: Never Used  . Alcohol Use: No     none since Feb 2010  . Drug Use: No  . Sexually Active: Not on file   Other Topics Concern  . Not on file   Social History Narrative   Patient does not get regular exerciseQuit smoking over 40 years ago    Current Outpatient Prescriptions on File Prior to Visit  Medication Sig Dispense Refill  . allopurinol (ZYLOPRIM) 300 MG tablet Take 1/2 tablet once daily  45 tablet  3  . Ascorbic Acid (VITAMIN C) 500 MG tablet Take 500 mg by mouth daily.        . Calcium-Magnesium-Zinc (CALCIUM/MAGNESIUM/ZINC  FORMULA) 1000-500-50 MG TABS Take 1 tablet by mouth daily.        . cholecalciferol (VITAMIN D) 1000 UNITS tablet Two tablets by mouth once daily       . cyclobenzaprine (FLEXERIL) 5 MG tablet 1-2 at bedtime as needed  20 tablet  0  . diltiazem (CARDIZEM CD) 180 MG 24 hr capsule Take 1 capsule (180 mg total) by mouth daily.  30 capsule  5  . ferrous sulfate 325 (65 FE) MG tablet Take 325 mg by mouth 2 (two) times daily.        . furosemide (LASIX) 40 MG tablet Take 1 tablet (40 mg total) by mouth daily.  30 tablet  10  . gabapentin (NEURONTIN) 100 MG capsule TAKE TWO CAPSULES BY MOUTH THREE TIMES DAILY  180 capsule  3  . insulin aspart (NOVOLOG) 100 UNIT/ML injection Inject subcutaneously 40 units with breakfast, 30 units with lunch and 50 units with the evening meal and take 5 extra units with any blood sugar over 200  40 mL  11  . insulin glargine (LANTUS) 100 UNIT/ML injection Inject 40 Units into the skin at bedtime.  20 mL  11  .  isosorbide mononitrate (IMDUR) 30 MG 24 hr tablet Take 2 tablets (60 mg total) by mouth daily.  60 tablet  8  . lactulose (CHRONULAC) 10 GM/15ML solution Take 1 and 1/2 tablespoons two times a day  4284 mL  3  . Loratadine (CLARITIN) 10 MG CAPS Take by mouth as directed.        . metoprolol tartrate (LOPRESSOR) 25 MG tablet TAKE 1/2 TABLET TWICE DAILY  90 tablet  1  . omeprazole (PRILOSEC) 20 MG capsule Take 20 mg by mouth daily.        . penicillin v potassium (VEETID) 250 MG tablet Take 1 tablet (250 mg total) by mouth daily.  90 tablet  3  . pentazocine-naloxone (TALWIN NX) 50-0.5 MG per tablet Take 1 tablet by mouth 2 (two) times daily.        . polyethylene glycol powder (GLYCOLAX/MIRALAX) powder MIX 1 TO 1 & 1/2 CAPFUL IN LIQUID OF YOUR CHOICE AND DRINK TWICE DAILY  3689 g  2  . predniSONE (DELTASONE) 5 MG tablet Take 5 mg by mouth daily.        Marland Kitchen RELION INSULIN SYR 0.5ML/31G 31G X 5/16" 0.5 ML MISC ONE SYRINGE FOUR TIMES DAILY  100 each  10  . rifaximin (XIFAXAN) 550 MG TABS Take 1 tablet (550 mg total) by mouth 2 (two) times daily.  180 tablet  5  . spironolactone (ALDACTONE) 50 MG tablet Take 1 tablet (50 mg total) by mouth daily.  90 tablet  3  . Thiamine HCl (VITAMIN B-1) 250 MG tablet 1/2 tablet by mouth once daily       . vitamin E 400 UNIT capsule Take 400 Units by mouth daily.        Marland Kitchen amitriptyline (ELAVIL) 10 MG tablet          Allergies  Allergen Reactions  . Amoxicillin-Pot Clavulanate   . Aspirin     REACTION: unable to tolerate due to cirrhosis and varices  . Buprenorphine Hcl-Naloxone Hcl Other (See Comments)    unresponsive  . Codeine   . Ezetimibe     REACTION: LEG/JOINT PAIN  . Hydromorphone Hcl   . Meperidine Hcl   . Morphine   . Morphine Sulfate     REACTION: unspecified  . Oxycodone  hallucinations  . Shellfish-Derived Products     rash  . Sulfonamide Derivatives     Family History  Problem Relation Age of Onset  . Alzheimer's disease Mother     . Cancer Father     GI (stomach?)  . Heart disease Sister     2 sisters  . Liver disease Sister   . Diabetes Brother   . Heart disease Brother     3 brothers  . Liver disease Brother   . Heart attack Other     sibling  . Melanoma Other     sibling  . Stroke Other     sibling   BP 138/72  Pulse 63  Temp 97.1 F (36.2 C) (Oral)  Ht 6\' 4"  (1.93 m)  Wt 270 lb (122.471 kg)  BMI 32.87 kg/m2  SpO2 96%  Review of Systems denies hypoglycemia    Objective:   Physical Exam VITAL SIGNS:  See vs page GENERAL: no distress NECK: There is no palpable thyroid enlargement.  No thyroid nodule is palpable.  No palpable lymphadenopathy at the anterior neck.  Lab Results  Component Value Date   HGBA1C 7.2* 08/01/2011      Assessment & Plan:  DM.  Needs slightly increased rx

## 2011-08-01 NOTE — Telephone Encounter (Signed)
Called pt to inform of lab results, pt informed (letter also mailed to pt). 

## 2011-09-03 ENCOUNTER — Other Ambulatory Visit: Payer: Self-pay | Admitting: Internal Medicine

## 2011-10-31 ENCOUNTER — Ambulatory Visit: Payer: Medicare Other | Admitting: Endocrinology

## 2011-11-05 ENCOUNTER — Other Ambulatory Visit: Payer: Self-pay | Admitting: Cardiology

## 2011-11-08 ENCOUNTER — Ambulatory Visit (INDEPENDENT_AMBULATORY_CARE_PROVIDER_SITE_OTHER): Payer: Medicare Other | Admitting: Endocrinology

## 2011-11-08 ENCOUNTER — Encounter: Payer: Self-pay | Admitting: Endocrinology

## 2011-11-08 ENCOUNTER — Encounter: Payer: Self-pay | Admitting: Internal Medicine

## 2011-11-08 ENCOUNTER — Other Ambulatory Visit (INDEPENDENT_AMBULATORY_CARE_PROVIDER_SITE_OTHER): Payer: Medicare Other

## 2011-11-08 VITALS — BP 138/62 | HR 60 | Temp 98.5°F | Wt 280.0 lb

## 2011-11-08 DIAGNOSIS — E1059 Type 1 diabetes mellitus with other circulatory complications: Secondary | ICD-10-CM

## 2011-11-08 DIAGNOSIS — I798 Other disorders of arteries, arterioles and capillaries in diseases classified elsewhere: Secondary | ICD-10-CM

## 2011-11-08 DIAGNOSIS — Z23 Encounter for immunization: Secondary | ICD-10-CM

## 2011-11-08 LAB — HEMOGLOBIN A1C: Hgb A1c MFr Bld: 6.8 % — ABNORMAL HIGH (ref 4.6–6.5)

## 2011-11-08 NOTE — Patient Instructions (Addendum)
blood tests are being requested for you today.  You will receive a letter with results. pending the test results, please change the novolog: 30 units with breakfast, 30 with lunch, and 60 with the evening meal.  However, take 5 extra units whenever the blood sugar is over 200.  continue the lantus, 35 units at bedtime.  check your blood sugar 2 times a day.  vary the time of day when you check, between before the 3 meals, and at bedtime.  also check if you have symptoms of your blood sugar being too high or too low.  please keep a record of the readings and bring it to your next appointment here.  please call us sooner if you are having low blood sugar episodes.  It is very important to vary the time of day when you check, including at bedtime.   Please come back for a follow-up appointment in 4 months.

## 2011-11-08 NOTE — Progress Notes (Signed)
Subjective:    Patient ID: [name], male    DOB: 1936-01-23, 76 y.o.   MRN: [id number]  HPI Pt returns for f/u of insulin-requiring DM (dx'ed 1992; complicated by peripheral sensory neuropathy, PAD, and CAD).  pt states he feels well in general.  he brings a record of his cbg's which i have reviewed today.  It varies from 54-300, but most are in the 100's.   It is lowest before lunch and in the afternoon, and highest at hs.   Past Medical History  Diagnosis Date  . Diverticulosis   . Gastric antral vascular ectasia   . Rhinophyma   . Melanoma     resected by [facility] 2008  . CAD (coronary artery disease)     myoview 2007 no ischemia/ no ASA because of GI disease  . Atrial fibrillation     amiodarone in past, but problems and left off meds.  . Aortic valve sclerosis     echo, September, 2010, mild aortic insufficiency  . Visual loss, one eye     right eye-ophthalmic arterial branch occlusion  . Internal hemorrhoids   . GERD (gastroesophageal reflux disease)   . Osteoarthritis   . Diabetes mellitus, type 2   . Hypertension   . Allergic rhinitis   . Anemia     multifactorial  . Cirrhosis, alcoholic   . Thrombocytopenia   . Bleeding esophageal varices   . Encephalopathy, unspecified   . Gout   . OSA (obstructive sleep apnea)     CPAP  . Short-segment Barrett's esophagus     suspected  . Venous insufficiency   . Ejection fraction     EF 55%, echo, September, 2010  . Carotid bruit     Dopplers okay in the past    Past Surgical History  Procedure Date  . Coronary artery bypass graft 2004    6 Bypass  . Total knee arthroplasty     right knee  . Esophagogastroduodenoscopy 04/10/2008    esophageal varices, cardia varix  . Tips procedure 2/10  . Laminectomy     x 4  . Hemorrhoid surgery   . Release scar contracture / graft repairs of hand     bilaterally   . Arm surgery     right   . Hiatal hernia repair   . Colonoscopy w/ polypectomy 02/14/2007    adenomatous  polyps, diverticulosis, internal hemorrhoids/rectal varices  . Cataract extraction, bilateral January 2013    History   Social History  . Marital Status: Married    Spouse Name: N/A    Number of Children: N/A  . Years of Education: N/A   Occupational History  .      retired   Social History Main Topics  . Smoking status: Former Smoker -- 2.0 packs/day for 20 years    Types: Cigarettes    Quit date: 02/22/1975  . Smokeless tobacco: Never Used  . Alcohol Use: No     none since Feb 2010  . Drug Use: No  . Sexually Active: Not on file   Other Topics Concern  . Not on file   Social History Narrative   Patient does not get regular exerciseQuit smoking over 40 years ago    Current Outpatient Prescriptions on File Prior to Visit  Medication Sig Dispense Refill  . allopurinol (ZYLOPRIM) 300 MG tablet Take 1/2 tablet once daily  45 tablet  3  . Ascorbic Acid (VITAMIN C) 500 MG tablet Take 500 mg by mouth  daily.        . Calcium-Magnesium-Zinc (CALCIUM/MAGNESIUM/ZINC FORMULA) 1000-500-50 MG TABS Take 1 tablet by mouth daily.        Marland Kitchen CARTIA XT 180 MG 24 hr capsule TAKE 1 CAPSULE ONE TIME DAILY  90 capsule  PRN  . cholecalciferol (VITAMIN D) 1000 UNITS tablet Two tablets by mouth once daily       . ferrous sulfate 325 (65 FE) MG tablet Take 325 mg by mouth 2 (two) times daily.        . furosemide (LASIX) 40 MG tablet Take 1 tablet (40 mg total) by mouth daily.  30 tablet  10  . gabapentin (NEURONTIN) 100 MG capsule       . glucose blood (FREESTYLE LITE) test strip 1 each by Other route 2 (two) times daily. And lancets 250.01  100 each  5  . insulin aspart (NOVOLOG) 100 UNIT/ML injection Inject subcutaneously 30 units with breakfast, 30 units with lunch and 60 units with the evening meal and take 5 extra units with any blood sugar over 200      . insulin glargine (LANTUS) 100 UNIT/ML injection Inject 40 Units into the skin at bedtime.  20 mL  11  . isosorbide mononitrate (IMDUR) 30  MG 24 hr tablet Take 2 tablets (60 mg total) by mouth daily.  60 tablet  8  . lactulose (CHRONULAC) 10 GM/15ML solution Take 1 and 1/2 tablespoons two times a day  4284 mL  3  . Loratadine (CLARITIN) 10 MG CAPS Take by mouth as directed.        . metoprolol tartrate (LOPRESSOR) 25 MG tablet TAKE 1/2 TABLET TWICE DAILY  90 tablet  1  . omeprazole (PRILOSEC) 20 MG capsule Take 20 mg by mouth daily.        . penicillin v potassium (VEETID) 250 MG tablet Take 1 tablet (250 mg total) by mouth daily.  90 tablet  3  . polyethylene glycol powder (GLYCOLAX/MIRALAX) powder MIX 1 TO 1 & 1/2 CAPFUL IN LIQUID OF YOUR CHOICE AND DRINK TWICE DAILY  3689 g  2  . predniSONE (DELTASONE) 5 MG tablet Take 5 mg by mouth daily.        Marland Kitchen RELION INSULIN SYR 0.5ML/31G 31G X 5/16" 0.5 ML MISC ONE SYRINGE FOUR TIMES DAILY  100 each  10  . rifaximin (XIFAXAN) 550 MG TABS Take 1 tablet (550 mg total) by mouth 2 (two) times daily.  180 tablet  5  . spironolactone (ALDACTONE) 50 MG tablet Take 1 tablet (50 mg total) by mouth daily.  90 tablet  3  . Thiamine HCl (VITAMIN B-1) 250 MG tablet 1/2 tablet by mouth once daily       . vitamin E 400 UNIT capsule Take 400 Units by mouth daily.          Allergies  Allergen Reactions  . Amoxicillin-Pot Clavulanate   . Aspirin     REACTION: unable to tolerate due to cirrhosis and varices  . Buprenorphine Hcl-Naloxone Hcl Other (See Comments)    unresponsive  . Codeine   . Ezetimibe     REACTION: LEG/JOINT PAIN  . Hydromorphone Hcl   . Meperidine Hcl   . Morphine   . Morphine Sulfate     REACTION: unspecified  . Oxycodone     hallucinations  . Shellfish-Derived Products     rash  . Sulfonamide Derivatives     Family History  Problem Relation Age of Onset  . Alzheimer's  disease Mother   . Cancer Father     GI (stomach?)  . Heart disease Sister     2 sisters  . Liver disease Sister   . Diabetes Brother   . Heart disease Brother     3 brothers  . Liver disease  Brother   . Heart attack Other     sibling  . Melanoma Other     sibling  . Stroke Other     sibling    BP 138/62  Pulse 60  Temp 98.5 F (36.9 C) (Oral)  Wt 280 lb (127.007 kg)  SpO2 97%    Review of Systems Denies LOC    Objective:   Physical Exam Pulses: dorsalis pedis intact bilat.   Feet: no deformity.  no ulcer on the feet.  feet are of normal color and temp.  There is 2+ right leg edema, and 1+ on the left.  There is bilteral onychomycosis.  There are bilat old healed surgical (leg vein harvest) scars.  There are several abrasions on the legs, but no ulcer.   Neuro: sensation is intact to touch on the feet, but decreased from normal.   Lab Results  Component Value Date   HGBA1C 6.8* 11/08/2011      Assessment & Plan:  DM, well-controlled

## 2011-11-09 ENCOUNTER — Telehealth: Payer: Self-pay | Admitting: *Deleted

## 2011-11-09 NOTE — Telephone Encounter (Signed)
Called pt to inform of lab results, pt informed (letter also mailed to pt). 

## 2011-11-18 ENCOUNTER — Telehealth: Payer: Self-pay | Admitting: Internal Medicine

## 2011-11-18 ENCOUNTER — Ambulatory Visit (INDEPENDENT_AMBULATORY_CARE_PROVIDER_SITE_OTHER): Payer: Medicare Other | Admitting: Internal Medicine

## 2011-11-18 ENCOUNTER — Other Ambulatory Visit (INDEPENDENT_AMBULATORY_CARE_PROVIDER_SITE_OTHER): Payer: Medicare Other

## 2011-11-18 ENCOUNTER — Other Ambulatory Visit: Payer: Self-pay

## 2011-11-18 ENCOUNTER — Encounter: Payer: Self-pay | Admitting: Internal Medicine

## 2011-11-18 VITALS — BP 132/50 | HR 60 | Ht 71.0 in | Wt 275.5 lb

## 2011-11-18 DIAGNOSIS — K7030 Alcoholic cirrhosis of liver without ascites: Secondary | ICD-10-CM

## 2011-11-18 DIAGNOSIS — R609 Edema, unspecified: Secondary | ICD-10-CM

## 2011-11-18 DIAGNOSIS — R300 Dysuria: Secondary | ICD-10-CM

## 2011-11-18 DIAGNOSIS — K7682 Hepatic encephalopathy: Secondary | ICD-10-CM

## 2011-11-18 DIAGNOSIS — K7290 Hepatic failure, unspecified without coma: Secondary | ICD-10-CM

## 2011-11-18 LAB — COMPREHENSIVE METABOLIC PANEL
ALT: 19 U/L (ref 0–53)
AST: 23 U/L (ref 0–37)
Albumin: 3.5 g/dL (ref 3.5–5.2)
Calcium: 9.3 mg/dL (ref 8.4–10.5)
Chloride: 104 mEq/L (ref 96–112)
Potassium: 4.1 mEq/L (ref 3.5–5.1)
Sodium: 139 mEq/L (ref 135–145)
Total Protein: 6.4 g/dL (ref 6.0–8.3)

## 2011-11-18 LAB — CBC WITH DIFFERENTIAL/PLATELET
Basophils Absolute: 0 10*3/uL (ref 0.0–0.1)
Eosinophils Absolute: 0 10*3/uL (ref 0.0–0.7)
Lymphocytes Relative: 15.3 % (ref 12.0–46.0)
MCHC: 32.6 g/dL (ref 30.0–36.0)
Neutrophils Relative %: 76.1 % (ref 43.0–77.0)
RDW: 15.7 % — ABNORMAL HIGH (ref 11.5–14.6)

## 2011-11-18 LAB — URINALYSIS
Bilirubin Urine: NEGATIVE
Ketones, ur: NEGATIVE
Leukocytes, UA: NEGATIVE
Nitrite: NEGATIVE
Specific Gravity, Urine: 1.025 (ref 1.000–1.030)
Urobilinogen, UA: 0.2 (ref 0.0–1.0)

## 2011-11-18 MED ORDER — FUROSEMIDE 40 MG PO TABS: 80.0000 mg | ORAL_TABLET | Freq: Every day | ORAL | Status: DC

## 2011-11-18 MED ORDER — "INSULIN SYRINGE-NEEDLE U-100 31G X 5/16"" 1 ML MISC": Status: DC

## 2011-11-18 MED ORDER — SPIRONOLACTONE 50 MG PO TABS: 100.0000 mg | ORAL_TABLET | Freq: Every day | ORAL | Status: DC

## 2011-11-18 NOTE — Telephone Encounter (Signed)
Caller: [name]/Spouse; Patient Name: [name]; PCP:  Alwyn Ren per Epic; patient call for Rom[facility] Belling (Adults only); Best Callback Phone Number: [phone].  Calling about insulin size syringe.  States Dr. Everardo All recently changed his insulin to 60u at suppertime, and his syringes are only 50u syringes.  States would like bigger syringe for his dinnertime dosing, so that he does not have to do 2 injections.  The 60 units seems to be working well; states his BS on arising AM 11/18/11 was 111.  A1C 6.8.  Info to office for provider review/Rx/callback. Uses [facility] [place].

## 2011-11-18 NOTE — Telephone Encounter (Signed)
Rx sent for 100 u syringes, pt's spouse informed.

## 2011-11-18 NOTE — Progress Notes (Signed)
Quick Note:  Labs are stable abnormal or normal (ok)  Increase furosemide to 80 mg every day and spironlactone to 100 mg daily  Recheck BMET in 2 weeks  Follow-up [facility] in 1 month please - I will notify Hopp ______

## 2011-11-18 NOTE — Patient Instructions (Addendum)
Your physician has requested that you go to the basement for the following lab work before leaving today: Urine, CBC/Diff, CMET  We will call you on your cell# [phone] as you requested with lab results and plans.  Thank you for choosing me and [facility].  Iva Boop, M.D., [facility]

## 2011-11-18 NOTE — Progress Notes (Signed)
  Subjective:    Patient ID: [name], male    DOB: [birth year], 76 y.o.   MRN: [id number]  HPI The patient returns with his wife for routine followup. His main problem is increasing lower extremity edema right greater than left. His wife reports that he has not been confused and overall his mood and affect and activity level have been as good as they have ever been. He was placed on a fentanyl patch for his back pain and though he still has back pain that seems to be helping modulate this and avoid severe problems.  He has not had any fever or chills. The legs have not been hot or active like that had cellulitis but he's had increase in the size of the lower extremities. He elevates the legs at night which helps him. He saw a podiatrist and some sort of supplement that is supposed to reduce pain inflammation was prescribed and he thinks that caused some nocturnal urination and frequency and dribbling. That seemed to start after that. [employer]on Palm - a "medical food product")  Medications, allergies, past medical history, past surgical history, family history and social history are reviewed and updated in the EMR.   Review of Systems As above    Objective:   Physical Exam General:  NAD chronically ill Eyes:   anicteric Lungs:  clear Heart:  S1S2 soft systolic murmur Abdomen:  Obese soft and nontender, BS+ Ext:   3+ edema in the right lower extremity fading below the knee. This is pitting. There are some ulcerations on the pretibial area there is a lot of erythema but no warmth or signs of cellulitis.   2+ edema in the left lower extremity or perhaps a little less, some erythema no ulcers.   Bilateral onychomycosis of the toenails.   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 Alcoholic cirrhosis of liver   2. Hepatic encephalopathy   The seem to be doing reasonably well, no change in therapy   3. Edema   Is  not a new problem and these have vascular evaluation as well as Dopplers to look for DVT daily. I think is prior vein stripping has something to do this along with his cirrhosis and related fluid retention. As long as his chemistries are okay I will increase his diuretics and asked him to be seen by his primary care physician about a month to check the status. I doubt there is much more we could do, this is all I noted to defer to primary care for other ideas. I can see him back routinely in 3-4 months. He will stop the supplement provided by the podiatrist.   4. Dysuria   Cause not clear, will check urinalysis.

## 2011-11-21 ENCOUNTER — Telehealth: Payer: Self-pay

## 2011-11-21 DIAGNOSIS — R600 Localized edema: Secondary | ICD-10-CM

## 2011-11-21 NOTE — Telephone Encounter (Signed)
Left message for patient/[name] to call back to discuss.  [facility] aware of the referral.  They will schedule with the patient and notify me of the appt date and time

## 2011-11-21 NOTE — Telephone Encounter (Signed)
LM with wife that should come back and see [place] in Jan-Feb. 2014, earlier if problems develop.  Recall reminder put in computer.

## 2011-11-21 NOTE — Telephone Encounter (Signed)
Patient's wife [name] aware she will be contacted about an appt

## 2011-11-21 NOTE — Telephone Encounter (Signed)
Message copied by [place], Wendal Wilkie E on Mon Nov 21, 2011  1:18 PM ------      Message from: Iva Boop      Created: Fri Nov 18, 2011  5:46 PM      Regarding: follow-up       Let him/wife know I would like to see him again in January-February

## 2011-11-21 NOTE — Telephone Encounter (Signed)
Message copied by Annett Fabian on Mon Nov 21, 2011  2:34 PM ------      Message from: Stan Head E      Created: Mon Nov 21, 2011 10:25 AM      Regarding: RE: follow-up mutual patient       Rip Harbour            Will refer him to vascular            I  am checking BMET on him in 2 weeks            Lavonna Rua - please refer Mr. Balogh to vascular clinic re: right>left edema after vein donation            He should also have a BMET in 2 weeks if not already set up            ----- Message -----         From: Pecola Lawless, MD         Sent: 11/21/2011  10:10 AM           To: Iva Boop, MD      Subject: RE: follow-up mutual patient                             As it is unilateral; diuretics more likely than not will not be of benefit & potentially only compromise renal function. The vascular specialist can hopefully provide some helpful intervention.      ----- Message -----         From: Iva Boop, MD         Sent: 11/21/2011   9:39 AM           To: Pecola Lawless, MD      Subject: RE: follow-up mutual patient                             Its worse on one with more extensive changes where the vein donation was more extensive - his wife said he had been there before - I can refer him if you want      ----- Message -----         From: Pecola Lawless, MD         Sent: 11/21/2011   8:54 AM           To: Iva Boop, MD      Subject: RE: follow-up mutual patient                             Vascular referral may be best option as edema is unilateral. Hopp      ----- Message -----         From: Iva Boop, MD         Sent: 11/21/2011   8:16 AM           To: Pecola Lawless, MD      Subject: follow-up mutual patient                                 His LE edema is worse            I ramped up diuretics and asked him to see you  in 1 month for recheck - sent you office note but cannot remember if I sent a FYI note also            Ill keep seeing him quarterly or so           Thanks            Baldo Ash

## 2011-11-22 NOTE — Telephone Encounter (Signed)
Patient is scheduled.  See appt tab

## 2011-12-02 ENCOUNTER — Other Ambulatory Visit (INDEPENDENT_AMBULATORY_CARE_PROVIDER_SITE_OTHER): Payer: Medicare Other

## 2011-12-02 ENCOUNTER — Telehealth: Payer: Self-pay | Admitting: Internal Medicine

## 2011-12-02 DIAGNOSIS — K7030 Alcoholic cirrhosis of liver without ascites: Secondary | ICD-10-CM

## 2011-12-02 LAB — BASIC METABOLIC PANEL
BUN: 25 mg/dL — ABNORMAL HIGH (ref 6–23)
Chloride: 101 mEq/L (ref 96–112)
Creatinine, Ser: 1.3 mg/dL (ref 0.4–1.5)
GFR: 57.89 mL/min — ABNORMAL LOW (ref >60.00)

## 2011-12-02 NOTE — Progress Notes (Signed)
Quick Note:  Labs ok except blood sugar too high at 223 How is the leg swelling? ______

## 2011-12-02 NOTE — Progress Notes (Signed)
Quick Note:  Go back to original doses Will see what vascular surgeon says ______

## 2011-12-02 NOTE — Telephone Encounter (Signed)
See results note on labs from today for additional notes

## 2011-12-05 ENCOUNTER — Other Ambulatory Visit: Payer: Self-pay | Admitting: Internal Medicine

## 2011-12-06 ENCOUNTER — Other Ambulatory Visit: Payer: Self-pay | Admitting: Internal Medicine

## 2011-12-06 MED ORDER — METOPROLOL TARTRATE 25 MG PO TABS: ORAL_TABLET | ORAL | Status: DC

## 2011-12-06 NOTE — Telephone Encounter (Signed)
refill metoprolol tart 25mg  tab #90 take 1/2 tablet twice daily -- last wrt 5.6.13 #90 wt/1-refill--last ov 5.1.13 acute--NOTE HAS Follow up appt scheduled for 11.8.13

## 2011-12-20 ENCOUNTER — Other Ambulatory Visit: Payer: Self-pay

## 2011-12-20 DIAGNOSIS — M7989 Other specified soft tissue disorders: Secondary | ICD-10-CM

## 2011-12-26 ENCOUNTER — Encounter: Payer: Self-pay | Admitting: Vascular Surgery

## 2011-12-27 ENCOUNTER — Encounter: Payer: Self-pay | Admitting: Vascular Surgery

## 2011-12-27 ENCOUNTER — Ambulatory Visit (INDEPENDENT_AMBULATORY_CARE_PROVIDER_SITE_OTHER): Payer: Medicare Other | Admitting: Vascular Surgery

## 2011-12-27 ENCOUNTER — Encounter (INDEPENDENT_AMBULATORY_CARE_PROVIDER_SITE_OTHER): Payer: Medicare Other | Admitting: Vascular Surgery

## 2011-12-27 ENCOUNTER — Telehealth: Payer: Self-pay | Admitting: Endocrinology

## 2011-12-27 VITALS — BP 123/73 | HR 63 | Resp 16 | Ht 71.0 in | Wt 278.0 lb

## 2011-12-27 DIAGNOSIS — R609 Edema, unspecified: Secondary | ICD-10-CM

## 2011-12-27 DIAGNOSIS — M7989 Other specified soft tissue disorders: Secondary | ICD-10-CM

## 2011-12-27 NOTE — Progress Notes (Signed)
Patient has today for evaluation of lower extremity edema. I had seen him for similar difficulties in December 2010. He has an extensive history with congestive heart failure cirrhosis having undergone tips procedure in 2010 he does have a history of total knee replacement in the right as well. He does have a massive right leg edema and somewhat less so on the left. He does have some weeping with an open superficial ulcerations over the pretibial area on the right.  Past Medical History  Diagnosis Date  . Diverticulosis   . Gastric antral vascular ectasia   . Rhinophyma   . Melanoma     resected by [facility] 2008  . CAD (coronary artery disease)     myoview 2007 no ischemia/ no ASA because of GI disease  . Atrial fibrillation     amiodarone in past, but problems and left off meds.  . Aortic valve sclerosis     echo, September, 2010, mild aortic insufficiency  . Visual loss, one eye     right eye-ophthalmic arterial branch occlusion  . Internal hemorrhoids   . GERD (gastroesophageal reflux disease)   . Osteoarthritis   . Diabetes mellitus, type 2   . Hypertension   . Allergic rhinitis   . Anemia     multifactorial  . Cirrhosis, alcoholic   . Thrombocytopenia   . Bleeding esophageal varices   . Encephalopathy, unspecified   . Gout   . OSA (obstructive sleep apnea)     CPAP  . Short-segment Barrett's esophagus     suspected  . Venous insufficiency   . Ejection fraction     EF 55%, echo, September, 2010  . Carotid bruit     Dopplers okay in the past  . Skin cancer     Basal cell and squamous cell  . Cellulitis   . CHF (congestive heart failure)     History  Substance Use Topics  . Smoking status: Former Smoker -- 2.0 packs/day for 20 years    Types: Cigarettes    Quit date: 02/22/1975  . Smokeless tobacco: Never Used  . Alcohol Use: No     Comment: none since Feb 2010    Family History  Problem Relation Age of Onset  . Alzheimer's disease Mother   . Cancer Father     GI (stomach?)  . Heart disease Sister     2 sisters  . Liver disease Sister   . Diabetes Brother   . Heart disease Brother     3 brothers  . Liver disease Brother   . Heart attack Other     sibling  . Melanoma Other     sibling  . Stroke Other     sibling    Allergies  Allergen Reactions  . Amoxicillin-Pot Clavulanate   . Aspirin     REACTION: unable to tolerate due to cirrhosis and varices  . Buprenorphine Hcl-Naloxone Hcl Other (See Comments)    unresponsive  . Codeine   . Ezetimibe     REACTION: LEG/JOINT PAIN  . Hydromorphone Hcl   . Meperidine Hcl   . Morphine   . Morphine Sulfate     REACTION: unspecified  . Oxycodone     hallucinations  . Shellfish-Derived Products     rash  . Sulfonamide Derivatives     Current outpatient prescriptions:allopurinol (ZYLOPRIM) 300 MG tablet, Take 1/2 tablet once daily, Disp: 45 tablet, Rfl: 3;  Ascorbic Acid (VITAMIN C) 500 MG tablet, Take 500 mg by mouth daily.  ,  Disp: , Rfl: ;  Calcium-Magnesium-Zinc (CALCIUM/MAGNESIUM/ZINC FORMULA) 1000-500-50 MG TABS, Take 1 tablet by mouth daily.  , Disp: , Rfl: ;  CARTIA XT 180 MG 24 hr capsule, TAKE 1 CAPSULE ONE TIME DAILY, Disp: 90 capsule, Rfl: PRN cholecalciferol (VITAMIN D) 1000 UNITS tablet, Two tablets by mouth once daily , Disp: , Rfl: ;  fentaNYL (DURAGESIC - DOSED MCG/HR) 12 MCG/HR, Place 1 patch onto the skin every other day. 25 mcg/hr, Disp: , Rfl: ;  ferrous sulfate 325 (65 FE) MG tablet, Take 325 mg by mouth 2 (two) times daily.  , Disp: , Rfl: ;  furosemide (LASIX) 40 MG tablet, Take 2 tablets (80 mg total) by mouth daily., Disp: 180 tablet, Rfl: 3 gabapentin (NEURONTIN) 100 MG capsule, , Disp: , Rfl: ;  gabapentin (NEURONTIN) 100 MG capsule, TAKE TWO CAPSULES BY MOUTH THREE TIMES DAILY, Disp: 180 capsule, Rfl: 0;  glucose blood (FREESTYLE LITE) test strip, 1 each by Other route 2 (two) times daily. And lancets 250.01, Disp: 100 each, Rfl: 5 insulin aspart (NOVOLOG) 100 UNIT/ML  injection, Inject subcutaneously 30 units with breakfast, 30 units with lunch and 60 units with the evening meal and take 5 extra units with any blood sugar over 200, Disp: , Rfl: ;  insulin glargine (LANTUS) 100 UNIT/ML injection, Inject 20 Units into the skin at bedtime. , Disp: , Rfl:  Insulin Syringe-Needle U-100 (RELION INSULIN SYRINGE 1ML/31G) 31G X 5/16" 1 ML MISC, Use as directed four time daily dx 250.73, Disp: 200 each, Rfl: 4;  isosorbide mononitrate (IMDUR) 30 MG 24 hr tablet, Take 2 tablets (60 mg total) by mouth daily., Disp: 60 tablet, Rfl: 8;  lactulose (CHRONULAC) 10 GM/15ML solution, Take 1 and 1/2 tablespoons two times a day, Disp: 4284 mL, Rfl: 3 Loratadine (CLARITIN) 10 MG CAPS, Take by mouth as directed.  , Disp: , Rfl: ;  metoprolol tartrate (LOPRESSOR) 25 MG tablet, TAKE 1/2 TABLET TWICE DAILY, Disp: 90 tablet, Rfl: 0;  omeprazole (PRILOSEC) 20 MG capsule, Take 20 mg by mouth daily.  , Disp: , Rfl: ;  penicillin v potassium (VEETID) 250 MG tablet, Take 1 tablet (250 mg total) by mouth daily., Disp: 90 tablet, Rfl: 3 polyethylene glycol powder (GLYCOLAX/MIRALAX) powder, MIX 1 TO 1 & 1/2 CAPFUL IN LIQUID OF YOUR CHOICE AND DRINK TWICE DAILY, Disp: 3689 g, Rfl: 2;  predniSONE (DELTASONE) 5 MG tablet, Take 5 mg by mouth daily.  , Disp: , Rfl: ;  rifaximin (XIFAXAN) 550 MG TABS, Take 1 tablet (550 mg total) by mouth 2 (two) times daily., Disp: 180 tablet, Rfl: 5 spironolactone (ALDACTONE) 50 MG tablet, Take 2 tablets (100 mg total) by mouth daily., Disp: 180 tablet, Rfl: 3;  Thiamine HCl (VITAMIN B-1) 250 MG tablet, 1/2 tablet by mouth once daily , Disp: , Rfl: ;  vitamin E 400 UNIT capsule, Take 400 Units by mouth daily.  , Disp: , Rfl:   BP 123/73  Pulse 63  Resp 16  Ht 5\' 11"  (1.803 m)  Wt 278 lb (126.1 kg)  BMI 38.77 kg/m2  Body mass index is 38.77 kg/(m^2).       Physical exam well-developed well-nourished gentleman in no acute distress Neurologically he is grossly  intact Skin does have superficial open ulcerations with evidence of infection over the pretibial area on the right He does have marked pitting edema on the right and less so on the left.  Vascular lab in our office today reveals no evidence of DVT he  does have patent and compressible deep systems bilaterally with apparent valvular competence.  I discussed this at length with the patient and his wife present. I do not see any correctable causes of venous hypertension. I have for stress importance of continued elevation when possible also I have explained the importance of compression. He does wear a light great support stockings but we have fitted in the day with an Ace wrap in addition to the compression and instructed him on the use of this. He will see [place] again on an as-needed basis

## 2011-12-27 NOTE — Telephone Encounter (Signed)
Pt's wife reports that pt is having trouble with his blood sugar for the past 2 days. She states that around lunch time and in the middle of the night, his sugar is dropping below 50. He is becoming disoriented and unsteady on his feet. Please advise.

## 2011-12-27 NOTE — Telephone Encounter (Signed)
Returned call to pt's wife and advised her per Dr. Everardo All to reduce his Lantus to 20 units at bedtime. Call us if they need [place].

## 2011-12-27 NOTE — Telephone Encounter (Signed)
reduce the lantus to 20 units at bedtime.

## 2011-12-27 NOTE — Telephone Encounter (Signed)
Pt's wife reports that pt is having trouble with his blood sugar for the past 2 days. She states that around lunch time and in the middle of the night, his sugar is dropping below 50. He is becoming disoriented and unsteady on his feet. Please advise.   

## 2011-12-29 ENCOUNTER — Telehealth: Payer: Self-pay

## 2011-12-29 ENCOUNTER — Other Ambulatory Visit: Payer: Self-pay | Admitting: Licensed Clinical Social Worker

## 2011-12-29 DIAGNOSIS — L02419 Cutaneous abscess of limb, unspecified: Secondary | ICD-10-CM

## 2011-12-29 MED ORDER — PENICILLIN V POTASSIUM 250 MG PO TABS
250.0000 mg | ORAL_TABLET | Freq: Every day | ORAL | Status: DC
Start: 2011-12-29 — End: 2012-05-14

## 2011-12-29 MED ORDER — PENICILLIN V POTASSIUM 250 MG PO TABS: 250.0000 mg | ORAL_TABLET | Freq: Every day | ORAL | Status: DC

## 2011-12-29 NOTE — Progress Notes (Signed)
Have the patient see me in January please

## 2011-12-29 NOTE — Telephone Encounter (Signed)
Spoke to patient and informed him that Dr. Leone Payor would like to see him in January 2014 .  The schedule is not in the computer yet so patient told to call back in December to set up.  He decided this after reading Dr. Lacinda Axon recent office visit.

## 2011-12-30 ENCOUNTER — Ambulatory Visit (INDEPENDENT_AMBULATORY_CARE_PROVIDER_SITE_OTHER): Payer: Medicare Other | Admitting: Internal Medicine

## 2011-12-30 ENCOUNTER — Encounter: Payer: Self-pay | Admitting: Internal Medicine

## 2011-12-30 VITALS — BP 130/72 | HR 59 | Wt 277.2 lb

## 2011-12-30 DIAGNOSIS — M791 Myalgia, unspecified site: Secondary | ICD-10-CM

## 2011-12-30 DIAGNOSIS — G589 Mononeuropathy, unspecified: Secondary | ICD-10-CM

## 2011-12-30 DIAGNOSIS — IMO0001 Reserved for inherently not codable concepts without codable children: Secondary | ICD-10-CM

## 2011-12-30 DIAGNOSIS — I10 Essential (primary) hypertension: Secondary | ICD-10-CM

## 2011-12-30 DIAGNOSIS — G629 Polyneuropathy, unspecified: Secondary | ICD-10-CM

## 2011-12-30 DIAGNOSIS — R609 Edema, unspecified: Secondary | ICD-10-CM

## 2011-12-30 DIAGNOSIS — M109 Gout, unspecified: Secondary | ICD-10-CM

## 2011-12-30 LAB — MAGNESIUM: Magnesium: 2 mg/dL (ref 1.5–2.5)

## 2011-12-30 MED ORDER — ALLOPURINOL 300 MG PO TABS: ORAL_TABLET | ORAL | Status: DC

## 2011-12-30 MED ORDER — GABAPENTIN 100 MG PO CAPS: ORAL_CAPSULE | ORAL | Status: DC

## 2011-12-30 MED ORDER — METOPROLOL TARTRATE 25 MG PO TABS: ORAL_TABLET | ORAL | Status: DC

## 2011-12-30 NOTE — Assessment & Plan Note (Signed)
Blood pressure is well controlled; I will discuss with Dr. Myrtis Ser as to whether he needs to be on both metoprolol and low-dose as well as the calcium channel blocker. Perhaps it could be consolidation of meds.

## 2011-12-30 NOTE — Assessment & Plan Note (Signed)
Referral will be made physical therapy to see if he qualifies for a "lymphedema" type program to treat the edema and help prevent recurrent cellulitis.

## 2011-12-30 NOTE — Assessment & Plan Note (Signed)
Uric acid level was 5.1 in 11/12. If the repeat value today he remains at low; allopurinol will be reduced to 100 mg daily. As noted there is mild increase in the creatinine and decrease in the GFR

## 2011-12-30 NOTE — Patient Instructions (Addendum)
BUN, creatinine, and GFR  all assess kidney function. To protect the kidneys it  is important to control your blood pressure and sugar. You should also stay well hydrated. Drink to thirst, up to 32 ounces of fluids a day.   Thank you for using My Chart; it will serve [place] well.

## 2011-12-30 NOTE — Progress Notes (Signed)
  Subjective:    Patient ID: [name], male    DOB: 1935/06/29, 76 y.o.   MRN: [id number]  HPI  He is here for refill of allopurinol, gabapentin, and metoprolol. He is on allopurinol 300 mg one half pill daily. He's had no gallop attacks; he is fearful that if this were stopped he would have a flare. Labs 12/02/11 were reviewed; creatinine is 1.3, BUN 25, and GFR 57.89.Dr Dierdre Forth has also Rxed  him low dose Prednisone for gout. Last uric acid on record is 5.1 in 11/12 Blood pressure is well controlled at home with averages less than 130/75. He denies chest pain, palpitations, or definite claudication. He does experience exertional dyspnea which is stable; he has no paroxysmal nocturnal dyspnea.  Gabapentin was originally prescribed by his orthopedist for peripheral neuropathy related to the degenerative disc disease. He is uncertain whether it is a definite benefit. He does have diabetic neuropathy.     Review of Systems Although he does not describe claudication; he has pain in the lower legs and weakness particularly in the right knee. His last A1c on record was 6.8% on 11/08/11.  He is on penicillin from infectious disease to treat the recurrent cellulitis in the right lower extremity.  Vascular studies by Dr. Arbie Cookey revealed no deep venous thrombosis.ACE wrap was recommended.  He describes cramps in the posterior thighs. His potassium and calcium levels were normal       Objective:   Physical Exam  General appearance ; adequate nourishment w/o distress.  Eyes: No conjunctival inflammation or scleral icterus is present.  Oral exam: Dental hygiene is good; lips and gums are healthy appearing.There is no oropharyngeal erythema or exudate noted.   Heart:  Normal rate and regular rhythm. S1 and S2 normal without gallop,  click, rub or other extra sounds .Grade 2/6  R base systolic murmur with carotid radiation   Lungs:Chest clear to auscultation; no wheezes, rhonchi,rales ,or rubs  present.No increased work of breathing.   Abdomen: bowel sounds normal, soft and non-tender without masses, organomegaly or hernias noted.  No guarding or rebound   Skin:Warm & dry. Erythema right shin; multiple lesions bandaged ;no jaundice or tenting  Lymphatic: No lymphadenopathy is noted about the head, neck, axilla  Vascular: Posterior tibial pulses and right dorsalis pedis pulse cannot be palpated due to the edema. The edema is asymmetric 2+ right lower extremity and 1+ in the right                                                                                             Assessment & Plan:

## 2012-01-02 ENCOUNTER — Other Ambulatory Visit: Payer: Self-pay | Admitting: Internal Medicine

## 2012-01-03 NOTE — Telephone Encounter (Signed)
Left message on voicemail for patient to return call when available   

## 2012-01-03 NOTE — Telephone Encounter (Signed)
Pt wife called [facility] triage stating pt out of med need them.     Plz advise   MW

## 2012-01-03 NOTE — Telephone Encounter (Signed)
I called patient and spoke with his wife: I informed her that rx was given to patient at [facility] on 12/30/11. Mrs.Pitstick confirmed that she has rx and will take it to the pharmacy

## 2012-01-13 ENCOUNTER — Encounter: Payer: Self-pay | Admitting: Internal Medicine

## 2012-01-24 ENCOUNTER — Other Ambulatory Visit: Payer: Self-pay | Admitting: Dermatology

## 2012-02-02 ENCOUNTER — Other Ambulatory Visit: Payer: Self-pay

## 2012-02-02 DIAGNOSIS — Z8679 Personal history of other diseases of the circulatory system: Secondary | ICD-10-CM

## 2012-02-02 MED ORDER — ISOSORBIDE MONONITRATE ER 30 MG PO TB24: 60.0000 mg | ORAL_TABLET | Freq: Every day | ORAL | Status: DC

## 2012-02-24 ENCOUNTER — Telehealth: Payer: Self-pay | Admitting: Internal Medicine

## 2012-02-24 NOTE — Telephone Encounter (Signed)
See if [facility] has earlier appt please

## 2012-02-24 NOTE — Telephone Encounter (Signed)
Pts wife states the patient is still 10th on the list at [facility] and wants to know if that is okay or if they need to go somewhere else. Please advise. CB# [phone]

## 2012-02-24 NOTE — Telephone Encounter (Signed)
Pt advise will f[facility] with [facility] for [facility] appt and let him know.

## 2012-03-01 NOTE — Telephone Encounter (Signed)
Referral coordinator currently trying to get Pt seen sooner at another facility. Pt already aware.

## 2012-03-02 ENCOUNTER — Encounter: Payer: Self-pay | Admitting: Internal Medicine

## 2012-03-02 ENCOUNTER — Other Ambulatory Visit (INDEPENDENT_AMBULATORY_CARE_PROVIDER_SITE_OTHER): Payer: Medicare Other

## 2012-03-02 ENCOUNTER — Ambulatory Visit (INDEPENDENT_AMBULATORY_CARE_PROVIDER_SITE_OTHER): Payer: Medicare Other | Admitting: Internal Medicine

## 2012-03-02 VITALS — BP 142/66 | HR 64 | Ht 71.0 in | Wt 290.2 lb

## 2012-03-02 DIAGNOSIS — R609 Edema, unspecified: Secondary | ICD-10-CM

## 2012-03-02 DIAGNOSIS — E119 Type 2 diabetes mellitus without complications: Secondary | ICD-10-CM

## 2012-03-02 DIAGNOSIS — K7290 Hepatic failure, unspecified without coma: Secondary | ICD-10-CM

## 2012-03-02 DIAGNOSIS — K7030 Alcoholic cirrhosis of liver without ascites: Secondary | ICD-10-CM

## 2012-03-02 DIAGNOSIS — R799 Abnormal finding of blood chemistry, unspecified: Secondary | ICD-10-CM

## 2012-03-02 DIAGNOSIS — R0989 Other specified symptoms and signs involving the circulatory and respiratory systems: Secondary | ICD-10-CM

## 2012-03-02 DIAGNOSIS — R601 Generalized edema: Secondary | ICD-10-CM

## 2012-03-02 DIAGNOSIS — R7989 Other specified abnormal findings of blood chemistry: Secondary | ICD-10-CM

## 2012-03-02 LAB — CBC WITH DIFFERENTIAL/PLATELET
Basophils Absolute: 0 10*3/uL (ref 0.0–0.1)
Basophils Relative: 0.1 % (ref 0.0–3.0)
Eosinophils Absolute: 0 10*3/uL (ref 0.0–0.7)
HCT: 33.3 % — ABNORMAL LOW (ref 39.0–52.0)
Hemoglobin: 11 g/dL — ABNORMAL LOW (ref 13.0–17.0)
Lymphocytes Relative: 21 % (ref 12.0–46.0)
Lymphs Abs: 1 10*3/uL (ref 0.7–4.0)
MCHC: 33.2 g/dL (ref 30.0–36.0)
Neutro Abs: 3.3 10*3/uL (ref 1.4–7.7)
RBC: 3.42 Mil/uL — ABNORMAL LOW (ref 4.22–5.81)
RDW: 15.8 % — ABNORMAL HIGH (ref 11.5–14.6)

## 2012-03-02 LAB — COMPREHENSIVE METABOLIC PANEL
ALT: 16 U/L (ref 0–53)
AST: 26 U/L (ref 0–37)
BUN: 23 mg/dL (ref 6–23)
CO2: 27 mEq/L (ref 19–32)
Calcium: 9.1 mg/dL (ref 8.4–10.5)
Chloride: 106 mEq/L (ref 96–112)
Creatinine, Ser: 1.4 mg/dL (ref 0.4–1.5)
GFR: 54.89 mL/min — ABNORMAL LOW (ref >60.00)
Total Bilirubin: 1.4 mg/dL — ABNORMAL HIGH (ref 0.3–1.2)

## 2012-03-02 LAB — PROTIME-INR
INR: 1.3 ratio — ABNORMAL HIGH (ref 0.8–1.0)
Prothrombin Time: 13.8 s — ABNORMAL HIGH (ref 10.2–12.4)

## 2012-03-02 LAB — BRAIN NATRIURETIC PEPTIDE: Pro B Natriuretic peptide (BNP): 395 pg/mL — ABNORMAL HIGH (ref 0.0–100.0)

## 2012-03-02 MED ORDER — RIFAXIMIN 550 MG PO TABS: 550.0000 mg | ORAL_TABLET | Freq: Two times a day (BID) | ORAL | Status: DC

## 2012-03-02 MED ORDER — LACTULOSE 10 GM/15ML PO SOLN: ORAL | Status: DC

## 2012-03-02 NOTE — Progress Notes (Signed)
Subjective:    Patient ID: [name], male    DOB: 07/10/1935, 77 y.o.   MRN: [id number]  HPI Fredrik Cove returns for follow-up of cirrhosis associated with edema/anasarca, hepatic encephalopathy. His wife accompanies him. He has been gaining weight and having increasing edema. Awaiting possible physiotherapy for edema. Blood sugars variable and some lows so has had some reduction in dose by Dr. Everardo All. Sometimes not eating so not taking insulin then. Appetite off at times. Sleeps some during day but no confusion.  Still has chronic back pain. Wife says he keeps going and tries to do some things. Allergies  Allergen Reactions  . Amoxicillin-Pot Clavulanate   . Aspirin     REACTION: unable to tolerate due to cirrhosis and varices  . Buprenorphine Hcl-Naloxone Hcl Other (See Comments)    unresponsive  . Codeine   . Ezetimibe     REACTION: LEG/JOINT PAIN  . Hydromorphone Hcl   . Meperidine Hcl   . Morphine   . Morphine Sulfate     REACTION: unspecified  . Oxycodone     hallucinations  . Shellfish-Derived Products     rash  . Sulfonamide Derivatives    Outpatient Prescriptions Prior to Visit  Medication Sig Dispense Refill  . allopurinol (ZYLOPRIM) 300 MG tablet Take 1/2 tablet once daily  45 tablet  0  . Ascorbic Acid (VITAMIN C) 500 MG tablet Take 500 mg by mouth daily.        . Calcium-Magnesium-Zinc (CALCIUM/MAGNESIUM/ZINC FORMULA) 1000-500-50 MG TABS Take 1 tablet by mouth daily.        Marland Kitchen CARTIA XT 180 MG 24 hr capsule TAKE 1 CAPSULE ONE TIME DAILY  90 capsule  PRN  . cholecalciferol (VITAMIN D) 1000 UNITS tablet Two tablets by mouth once daily       . ferrous sulfate 325 (65 FE) MG tablet Take 325 mg by mouth 2 (two) times daily.        . furosemide (LASIX) 40 MG tablet Take 40 mg by mouth daily.      Marland Kitchen gabapentin (NEURONTIN) 100 MG capsule TAKE TWO CAPSULES BY MOUTH THREE TIMES DAILY  360 capsule  1  . glucose blood (FREESTYLE LITE) test strip 1 each by Other route 2 (two)  times daily. And lancets 250.01  100 each  5  . insulin aspart (NOVOLOG) 100 UNIT/ML injection Inject subcutaneously 30 units with breakfast, 30 units with lunch and 60 units with the evening meal and take 5 extra units with any blood sugar over 200      . insulin glargine (LANTUS) 100 UNIT/ML injection Inject 20 Units into the skin at bedtime.       . Insulin Syringe-Needle U-100 (RELION INSULIN SYRINGE 1ML/31G) 31G X 5/16" 1 ML MISC Use as directed four time daily dx 250.73  200 each  4  . isosorbide mononitrate (IMDUR) 30 MG 24 hr tablet Take 2 tablets (60 mg total) by mouth daily.  60 tablet  3  . Loratadine (CLARITIN) 10 MG CAPS Take by mouth as directed.        . metoprolol tartrate (LOPRESSOR) 25 MG tablet TAKE 1/2 TABLET TWICE DAILY  90 tablet  1  . omeprazole (PRILOSEC) 20 MG capsule Take 20 mg by mouth daily.        . penicillin v potassium (VEETID) 250 MG tablet Take 1 tablet (250 mg total) by mouth daily.  30 tablet  0  . polyethylene glycol powder (GLYCOLAX/MIRALAX) powder MIX 1 TO 1 &  1/2 CAPFUL IN LIQUID OF YOUR CHOICE AND DRINK TWICE DAILY  3689 g  2  . predniSONE (DELTASONE) 5 MG tablet Take 5 mg by mouth daily.        Marland Kitchen spironolactone (ALDACTONE) 50 MG tablet Take 50 mg by mouth daily.      . Thiamine HCl (VITAMIN B-1) 250 MG tablet 1/2 tablet by mouth once daily       . vitamin E 400 UNIT capsule Take 400 Units by mouth daily.        . [DISCONTINUED] lactulose (CHRONULAC) 10 GM/15ML solution Take 1 and 1/2 tablespoons two times a day  4284 mL  3  . [DISCONTINUED] rifaximin (XIFAXAN) 550 MG TABS Take 1 tablet (550 mg total) by mouth 2 (two) times daily.  180 tablet  5  . [DISCONTINUED] fentaNYL (DURAGESIC - DOSED MCG/HR) 25 MCG/HR Place 1 patch onto the skin every 3 (three) days.       Last reviewed on 03/02/2012  8:41 AM by Iva Boop, MD Past Medical History  Diagnosis Date  . Diverticulosis   . Gastric antral vascular ectasia   . Rhinophyma   . Melanoma     resected  by [facility] 2008  . CAD (coronary artery disease)     myoview 2007 no ischemia/ no ASA because of GI disease  . Atrial fibrillation     amiodarone in past, but problems and left off meds.  . Aortic valve sclerosis     echo, September, 2010, mild aortic insufficiency  . Visual loss, one eye     right eye-ophthalmic arterial branch occlusion  . Internal hemorrhoids   . GERD (gastroesophageal reflux disease)   . Osteoarthritis   . Diabetes mellitus, type 2   . Hypertension   . Allergic rhinitis   . Anemia     multifactorial  . Cirrhosis, alcoholic   . Thrombocytopenia   . Bleeding esophageal varices   . Encephalopathy, unspecified   . Gout   . OSA (obstructive sleep apnea)     CPAP  . Short-segment Barrett's esophagus     suspected  . Venous insufficiency   . Ejection fraction     EF 55%, echo, September, 2010  . Carotid bruit     Dopplers okay in the past  . Skin cancer     Basal cell and squamous cell  . Cellulitis   . CHF (congestive heart failure)    Past Surgical History  Procedure Date  . Total knee arthroplasty     right knee  . Esophagogastroduodenoscopy 04/10/2008    esophageal varices, cardia varix  . Tips procedure 2/10  . Laminectomy     x 4  . Hemorrhoid surgery   . Release scar contracture / graft repairs of hand     bilaterally   . Arm surgery     right   . Hiatal hernia repair   . Colonoscopy w/ polypectomy 02/14/2007    adenomatous polyps, diverticulosis, internal hemorrhoids/rectal varices  . Cataract extraction, bilateral January 2013  . Coronary artery bypass graft 1993    6 Bypass    Review of Systems As above     Objective:   Physical Exam General:  NAD - chronically ill - examined sitting due to severe back pain Eyes:   Anicteric Lungs:  clear Heart:  S1S2 2/6 systolic ejection murmur, no gallops, + JVD Abdomen:  soft and nontender, BS+, obese vs. ascites Ext:   Bilateral 4+ LE edema, right > left, stockings  on, extends to thighs, +  presacral edema Neuro:  A and o x 3, no asterixis    Data Reviewed:  Wt Readings from Last 3 Encounters:  03/02/12 290 lb 3.2 oz (131.634 kg)  12/30/11 277 lb 3.2 oz (125.737 kg)  12/27/11 278 lb (126.1 kg)      Assessment & Plan:   1. Alcoholic cirrhosis of liver  CBC with Differential, Comprehensive metabolic panel, Hemoglobin A1c, Brain natriuretic peptide, Protime-INR  2. Anasarca  CBC with Differential, Comprehensive metabolic panel, Hemoglobin A1c, Brain natriuretic peptide, Protime-INR  3. Hepatic encephalopathy  CBC with Differential, Comprehensive metabolic panel, Hemoglobin A1c, Brain natriuretic peptide, Protime-INR  4. Type II diabetes mellitus  CBC with Differential, Comprehensive metabolic panel, Hemoglobin A1c, Brain natriuretic peptide, Protime-INR  5. Abnormal blood chemistry   Hemoglobin A1c  6. Dyspnea on effort   Brain natriuretic peptide    Anasarca likely from cirrhosis nut showing some JVD and ? Component of heart failure from diastolic dysfunction. Cirrhosis, encephalopathy otherwise ok and will continue medications. Need to increase diuretics but check labs first.  Lab Results  Component Value Date   WBC 4.7 03/02/2012   HGB 11.0* 03/02/2012   HCT 33.3* 03/02/2012   MCV 97.3 03/02/2012   PLT 67.0* 03/02/2012     Chemistry      Component Value Date/Time   NA 140 03/02/2012 0938   K 4.1 03/02/2012 0938   CL 106 03/02/2012 0938   CO2 27 03/02/2012 0938   BUN 23 03/02/2012 0938   CREATININE 1.4 03/02/2012 0938      Component Value Date/Time   CALCIUM 9.1 03/02/2012 0938   ALKPHOS 53 03/02/2012 0938   AST 26 03/02/2012 0938   ALT 16 03/02/2012 0938   BILITOT 1.4* 03/02/2012 0938     Lab Results  Component Value Date   INR 1.3* 03/02/2012   INR 1.2* 06/20/2011   INR 1.2* 10/12/2010  BNP is high at 395 but has been in this range  Will increase furosemide to 80 mg daily (from 40) and spironolactone from 50 to 100 mg daily. Forgot to ask about salt restriction  ut needs to do that. Recheck BMET in about 3 weeks. Has cardiology f/u in about 10 days Has endocrine f/u next week - I checked A1C and its ok Lab Results  Component Value Date   HGBA1C 6.5 03/02/2012      Cc: Marga Melnick, MD, Romero Belling, MD, Jerral Bonito, MD

## 2012-03-02 NOTE — Patient Instructions (Addendum)
Your physician has requested that you go to the basement for the following lab work before leaving today: CBC/diff, CMET, Hgb-A1C, BNP, Protime INR  We have sent the following medications to your pharmacy for you to pick up at your convenience: Xifaxan and Lactulose  We have also given you written rx's for the xifaxan and lactulose to mail off.  We will call you with plans when lab results are in.  Thank you for choosing me and [facility].  Iva Boop, M.D., [facility]

## 2012-03-02 NOTE — Progress Notes (Signed)
Quick Note:  I call results to his wife. Plan is to increase his Spiriva lactone to 100 mg every morning and furosemide 80 mg every morning. He is to return on February 3 have a basic metabolic panel checked He is to see me in about 2 months, she will schedule a followup   ______

## 2012-03-05 NOTE — Telephone Encounter (Signed)
Patient is now scheduled for PT at [facility] on 03/09/12 at 9:00am.

## 2012-03-07 ENCOUNTER — Encounter: Payer: Self-pay | Admitting: Endocrinology

## 2012-03-07 ENCOUNTER — Ambulatory Visit (INDEPENDENT_AMBULATORY_CARE_PROVIDER_SITE_OTHER): Payer: Medicare Other | Admitting: Endocrinology

## 2012-03-07 VITALS — BP 140/80 | HR 72 | Temp 97.8°F | Wt 283.0 lb

## 2012-03-07 DIAGNOSIS — E1059 Type 1 diabetes mellitus with other circulatory complications: Secondary | ICD-10-CM

## 2012-03-07 NOTE — Progress Notes (Signed)
Subjective:    Patient ID: [name], male    DOB: 1935/07/19, 77 y.o.   MRN: [id number]  HPI Pt returns for f/u of insulin-requiring DM (dx'ed 1992; complicated by peripheral sensory neuropathy, PAD, and CAD).  pt states he feels well in general.  he brings a record of his cbg's which i have reviewed today.  It varies from 45-200, but most are in the 100's.   It is lowest in am.  He called to report hypoglycemia, so lantus was decreased to 20 units qhs.  Since the decrease, he has less frequent hypoglycemia, but it still happens approx twice a week.   Past Medical History  Diagnosis Date  . Diverticulosis   . Gastric antral vascular ectasia   . Rhinophyma   . Melanoma     resected by [facility] 2008  . CAD (coronary artery disease)     myoview 2007 no ischemia/ no ASA because of GI disease  . Atrial fibrillation     amiodarone in past, but problems and left off meds.  . Aortic valve sclerosis     echo, September, 2010, mild aortic insufficiency  . Visual loss, one eye     right eye-ophthalmic arterial branch occlusion  . Internal hemorrhoids   . GERD (gastroesophageal reflux disease)   . Osteoarthritis   . Diabetes mellitus, type 2   . Hypertension   . Allergic rhinitis   . Anemia     multifactorial  . Cirrhosis, alcoholic   . Thrombocytopenia   . Bleeding esophageal varices   . Encephalopathy, unspecified   . Gout   . OSA (obstructive sleep apnea)     CPAP  . Short-segment Barrett's esophagus     suspected  . Venous insufficiency   . Ejection fraction     EF 55%, echo, September, 2010  . Carotid bruit     Dopplers okay in the past  . Skin cancer     Basal cell and squamous cell  . Cellulitis   . CHF (congestive heart failure)     Past Surgical History  Procedure Date  . Total knee arthroplasty     right knee  . Esophagogastroduodenoscopy 04/10/2008    esophageal varices, cardia varix  . Tips procedure 2/10  . Laminectomy     x 4  . Hemorrhoid surgery   .  Release scar contracture / graft repairs of hand     bilaterally   . Arm surgery     right   . Hiatal hernia repair   . Colonoscopy w/ polypectomy 02/14/2007    adenomatous polyps, diverticulosis, internal hemorrhoids/rectal varices  . Cataract extraction, bilateral January 2013  . Coronary artery bypass graft 1993    6 Bypass    History   Social History  . Marital Status: Married    Spouse Name: N/A    Number of Children: N/A  . Years of Education: N/A   Occupational History  .      retired   Social History Main Topics  . Smoking status: Former Smoker -- 2.0 packs/day for 20 years    Types: Cigarettes    Quit date: 02/22/1975  . Smokeless tobacco: Never Used  . Alcohol Use: No     Comment: none since Feb 2010  . Drug Use: No  . Sexually Active: Not on file   Other Topics Concern  . Not on file   Social History Narrative   Patient does not get regular exerciseQuit smoking over 40 years  ago    Current Outpatient Prescriptions on File Prior to Visit  Medication Sig Dispense Refill  . allopurinol (ZYLOPRIM) 300 MG tablet Take 1/2 tablet once daily  45 tablet  0  . Ascorbic Acid (VITAMIN C) 500 MG tablet Take 500 mg by mouth daily.        . Calcium-Magnesium-Zinc (CALCIUM/MAGNESIUM/ZINC FORMULA) 1000-500-50 MG TABS Take 1 tablet by mouth daily.        Marland Kitchen CARTIA XT 180 MG 24 hr capsule TAKE 1 CAPSULE ONE TIME DAILY  90 capsule  PRN  . cholecalciferol (VITAMIN D) 1000 UNITS tablet Two tablets by mouth once daily       . fentaNYL (DURAGESIC - DOSED MCG/HR) 50 MCG/HR Place 1 patch onto the skin every 3 (three) days.      . ferrous sulfate 325 (65 FE) MG tablet Take 325 mg by mouth 2 (two) times daily.        . furosemide (LASIX) 40 MG tablet Take 40 mg by mouth daily.      Marland Kitchen gabapentin (NEURONTIN) 100 MG capsule TAKE TWO CAPSULES BY MOUTH THREE TIMES DAILY  360 capsule  1  . glucose blood (FREESTYLE LITE) test strip 1 each by Other route 2 (two) times daily. And lancets  250.01  100 each  5  . insulin aspart (NOVOLOG) 100 UNIT/ML injection Inject subcutaneously 30 units with breakfast, 30 units with lunch and 60 units with the evening meal and take 5 extra units with any blood sugar over 200      . insulin glargine (LANTUS) 100 UNIT/ML injection Inject 15 Units into the skin at bedtime.       . Insulin Syringe-Needle U-100 (RELION INSULIN SYRINGE 1ML/31G) 31G X 5/16" 1 ML MISC Use as directed four time daily dx 250.73  200 each  4  . isosorbide mononitrate (IMDUR) 30 MG 24 hr tablet Take 2 tablets (60 mg total) by mouth daily.  60 tablet  3  . lactulose (CHRONULAC) 10 GM/15ML solution Take 1 and 1/2 tablespoons two times a day  4050 mL  3  . Loratadine (CLARITIN) 10 MG CAPS Take by mouth as directed.        . metoprolol tartrate (LOPRESSOR) 25 MG tablet TAKE 1/2 TABLET TWICE DAILY  90 tablet  1  . omeprazole (PRILOSEC) 20 MG capsule Take 20 mg by mouth daily.        . penicillin v potassium (VEETID) 250 MG tablet Take 1 tablet (250 mg total) by mouth daily.  30 tablet  0  . polyethylene glycol powder (GLYCOLAX/MIRALAX) powder MIX 1 TO 1 & 1/2 CAPFUL IN LIQUID OF YOUR CHOICE AND DRINK TWICE DAILY  3689 g  2  . predniSONE (DELTASONE) 5 MG tablet Take 5 mg by mouth daily.        . rifaximin (XIFAXAN) 550 MG TABS Take 1 tablet (550 mg total) by mouth 2 (two) times daily.  180 tablet  3  . spironolactone (ALDACTONE) 50 MG tablet Take 50 mg by mouth daily.      . Thiamine HCl (VITAMIN B-1) 250 MG tablet 1/2 tablet by mouth once daily       . vitamin E 400 UNIT capsule Take 400 Units by mouth daily.          Allergies  Allergen Reactions  . Amoxicillin-Pot Clavulanate   . Aspirin     REACTION: unable to tolerate due to cirrhosis and varices  . Buprenorphine Hcl-Naloxone Hcl Other (See Comments)  unresponsive  . Codeine   . Ezetimibe     REACTION: LEG/JOINT PAIN  . Hydromorphone Hcl   . Meperidine Hcl   . Morphine   . Morphine Sulfate     REACTION:  unspecified  . Oxycodone     hallucinations  . Shellfish-Derived Products     rash  . Sulfonamide Derivatives     Family History  Problem Relation Age of Onset  . Alzheimer's disease Mother   . Cancer Father     GI (stomach?)  . Heart disease Sister     2 sisters  . Liver disease Sister   . Diabetes Brother   . Heart disease Brother     3 brothers  . Liver disease Brother   . Heart attack Other     sibling  . Melanoma Other     sibling  . Stroke Other     sibling    BP 140/80  Pulse 72  Temp 97.8 F (36.6 C) (Oral)  Wt 283 lb (128.368 kg)  SpO2 94%    Review of Systems Denies LOC    Objective:   Physical Exam VITAL SIGNS:  See vs page GENERAL: no distress SKIN:  Insulin injection sites at the anterior abdomen are normal.  Lab Results  Component Value Date   HGBA1C 6.5 03/02/2012      Assessment & Plan:  DM is slightly overcontrolled

## 2012-03-07 NOTE — Patient Instructions (Addendum)
pending the test results, please continue the novolog: 30 units with breakfast, 30 with lunch, and 60 with the evening meal.  However, take 5 extra units whenever the blood sugar is over 200.  Please reduce the lantus to 15 units at bedtime.  check your blood sugar 2 times a day.  vary the time of day when you check, between before the 3 meals, and at bedtime.  also check if you have symptoms of your blood sugar being too high or too low.  please keep a record of the readings and bring it to your next appointment here.  please call us sooner if you are having low blood sugar episodes.  It is very important to vary the time of day when you check, including at bedtime.   Please come back for a follow-up appointment in 4 months.

## 2012-03-12 ENCOUNTER — Ambulatory Visit (INDEPENDENT_AMBULATORY_CARE_PROVIDER_SITE_OTHER): Payer: Medicare Other | Admitting: Cardiology

## 2012-03-12 ENCOUNTER — Encounter: Payer: Self-pay | Admitting: Cardiology

## 2012-03-12 VITALS — BP 140/58 | HR 53 | Ht 71.0 in | Wt 276.8 lb

## 2012-03-12 DIAGNOSIS — I5030 Unspecified diastolic (congestive) heart failure: Secondary | ICD-10-CM

## 2012-03-12 DIAGNOSIS — I872 Venous insufficiency (chronic) (peripheral): Secondary | ICD-10-CM

## 2012-03-12 DIAGNOSIS — I10 Essential (primary) hypertension: Secondary | ICD-10-CM

## 2012-03-12 DIAGNOSIS — I2510 Atherosclerotic heart disease of native coronary artery without angina pectoris: Secondary | ICD-10-CM

## 2012-03-12 DIAGNOSIS — Z8679 Personal history of other diseases of the circulatory system: Secondary | ICD-10-CM

## 2012-03-12 DIAGNOSIS — R9430 Abnormal result of cardiovascular function study, unspecified: Secondary | ICD-10-CM

## 2012-03-12 DIAGNOSIS — R0989 Other specified symptoms and signs involving the circulatory and respiratory systems: Secondary | ICD-10-CM

## 2012-03-12 DIAGNOSIS — I4891 Unspecified atrial fibrillation: Secondary | ICD-10-CM

## 2012-03-12 DIAGNOSIS — I498 Other specified cardiac arrhythmias: Secondary | ICD-10-CM

## 2012-03-12 DIAGNOSIS — I350 Nonrheumatic aortic (valve) stenosis: Secondary | ICD-10-CM | POA: Insufficient documentation

## 2012-03-12 DIAGNOSIS — R609 Edema, unspecified: Secondary | ICD-10-CM

## 2012-03-12 DIAGNOSIS — I509 Heart failure, unspecified: Secondary | ICD-10-CM

## 2012-03-12 DIAGNOSIS — K7030 Alcoholic cirrhosis of liver without ascites: Secondary | ICD-10-CM

## 2012-03-12 DIAGNOSIS — R001 Bradycardia, unspecified: Secondary | ICD-10-CM

## 2012-03-12 DIAGNOSIS — I359 Nonrheumatic aortic valve disorder, unspecified: Secondary | ICD-10-CM

## 2012-03-12 MED ORDER — DILTIAZEM HCL ER COATED BEADS 180 MG PO CP24: 180.0000 mg | ORAL_CAPSULE | Freq: Every day | ORAL | Status: DC

## 2012-03-12 MED ORDER — ISOSORBIDE MONONITRATE ER 60 MG PO TB24: 60.0000 mg | ORAL_TABLET | Freq: Every day | ORAL | Status: DC

## 2012-03-12 NOTE — Assessment & Plan Note (Signed)
Historically his LV function has been good. His last echo was in 2012. I've chosen not to repeat his echo at this time.

## 2012-03-12 NOTE — Assessment & Plan Note (Signed)
This is a significant problem for him. He is followed very carefully by GI.

## 2012-03-12 NOTE — Assessment & Plan Note (Signed)
The patient had mild aortic stenosis on his most recent echo in 2012. He does not need a followup echo now.

## 2012-03-12 NOTE — Assessment & Plan Note (Signed)
Coronary disease is stable. There is no reason to push for exercise testing at this point.

## 2012-03-12 NOTE — Assessment & Plan Note (Addendum)
The patient has remained in sinus rhythm. He is doing well. No further workup needed. We have not seen any atrial fibrillation for many years. I have reviewed my records back several years. There is no mention of anticoagulation for atrial fibrillation. The patient has severe liver disease. His INR is followed by his GI team. It is my assumption that he would not be a candidate for anticoagulation. His INR off medicines is 1.3. I have not made any changes.

## 2012-03-12 NOTE — Assessment & Plan Note (Signed)
There is mild sinus bradycardia. I've chosen not to change any of his medicines.

## 2012-03-12 NOTE — Assessment & Plan Note (Signed)
This is being managed. In addition his total body volume overload makes this worse. A combination of diuresis and therapy to his leg is appropriate.

## 2012-03-12 NOTE — Patient Instructions (Addendum)
Your physician wants you to follow-up in: 1 year  You will receive a reminder letter in the mail two months in advance. If you don't receive a letter, please call our office to schedule the follow-up appointment.  Your physician recommends that you continue on your current medications as directed. Please refer to the Current Medication list given to you today.  

## 2012-03-12 NOTE — Assessment & Plan Note (Signed)
Blood pressure is controlled. No change in therapy. 

## 2012-03-12 NOTE — Assessment & Plan Note (Signed)
The patient's fluid overload is clearly multifactorial. He has significant liver disease with ascites. He has some diastolic dysfunction. He has mild aortic stenosis. There is significant venous insufficiency. Recently his BNP was 360. This is mildly elevated for him. We have seen at both higher than this and lower than this in the past. This number was before his most recent diuresis. I've chosen not to repeat his echo assuming that his LV function remains the same and that he has mild aortic stenosis. The key to his therapy will be to continue his medications and to limit salt intake and to keep his fluid intake at moderation. I discussed this with him at length.  As part of today's evaluation I spent greater than 25 minutes reviewing all of his records and seeing the patient. More than half of this time was spent with direct contact talking to the patient and his wife.

## 2012-03-12 NOTE — Progress Notes (Signed)
HPI  Patient is seen to followup history of atrial fibrillation, fluid overload, aortic valve sclerosis. He is being expertly managed by Dr. Leone Payor relative to his liver disease and ascites and fluid overload. He has been seen by vascular surgery and no interventions are planned. He is starting therapy for his legs. He's not having any chest pain. His diuretics were changed and he has diaries. With this he is not having as much shortness of breath. BNP had been checked recently and was in the range of 350. This is not the highest we have ever seen but it is mildly elevated for him. This was drawn before his recent diuresis. He is to return to have followup labs soon.  The patient had a 2-D echo March, 2012. He continued to have normal LV function with an ejection fraction 60-65%. There was left ventricular dysfunction. There was mild aortic stenosis and mild mitral regurgitation.  Allergies  Allergen Reactions  . Amoxicillin-Pot Clavulanate   . Aspirin     REACTION: unable to tolerate due to cirrhosis and varices  . Buprenorphine Hcl-Naloxone Hcl Other (See Comments)    unresponsive  . Codeine   . Ezetimibe     REACTION: LEG/JOINT PAIN  . Hydromorphone Hcl   . Meperidine Hcl   . Morphine   . Morphine Sulfate     REACTION: unspecified  . Oxycodone     hallucinations  . Shellfish-Derived Products     rash  . Sulfonamide Derivatives     Current Outpatient Prescriptions  Medication Sig Dispense Refill  . allopurinol (ZYLOPRIM) 300 MG tablet Take 1/2 tablet once daily  45 tablet  0  . Ascorbic Acid (VITAMIN C) 500 MG tablet Take 500 mg by mouth daily.        . Calcium-Magnesium-Zinc (CALCIUM/MAGNESIUM/ZINC FORMULA) 1000-500-50 MG TABS Take 1 tablet by mouth daily.        Marland Kitchen CARTIA XT 180 MG 24 hr capsule TAKE 1 CAPSULE ONE TIME DAILY  90 capsule  PRN  . cholecalciferol (VITAMIN D) 1000 UNITS tablet Two tablets by mouth once daily       . fentaNYL (DURAGESIC - DOSED MCG/HR) 50 MCG/HR  Place 1 patch onto the skin every 3 (three) days.      . ferrous sulfate 325 (65 FE) MG tablet Take 325 mg by mouth 2 (two) times daily.        . furosemide (LASIX) 40 MG tablet Take 40 mg by mouth daily.      Marland Kitchen gabapentin (NEURONTIN) 100 MG capsule TAKE TWO CAPSULES BY MOUTH THREE TIMES DAILY  360 capsule  1  . glucose blood (FREESTYLE LITE) test strip 1 each by Other route 2 (two) times daily. And lancets 250.01  100 each  5  . insulin aspart (NOVOLOG) 100 UNIT/ML injection Inject subcutaneously 30 units with breakfast, 30 units with lunch and 60 units with the evening meal and take 5 extra units with any blood sugar over 200      . insulin glargine (LANTUS) 100 UNIT/ML injection Inject 15 Units into the skin at bedtime.       . Insulin Syringe-Needle U-100 (RELION INSULIN SYRINGE 1ML/31G) 31G X 5/16" 1 ML MISC Use as directed four time daily dx 250.73  200 each  4  . isosorbide mononitrate (IMDUR) 30 MG 24 hr tablet Take 2 tablets (60 mg total) by mouth daily.  60 tablet  3  . lactulose (CHRONULAC) 10 GM/15ML solution Take 1 and 1/2 tablespoons two  times a day  4050 mL  3  . Loratadine (CLARITIN) 10 MG CAPS Take by mouth as directed.        . metoprolol tartrate (LOPRESSOR) 25 MG tablet TAKE 1/2 TABLET TWICE DAILY  90 tablet  1  . omeprazole (PRILOSEC) 20 MG capsule Take 20 mg by mouth daily.        . penicillin v potassium (VEETID) 250 MG tablet Take 1 tablet (250 mg total) by mouth daily.  30 tablet  0  . polyethylene glycol powder (GLYCOLAX/MIRALAX) powder MIX 1 TO 1 & 1/2 CAPFUL IN LIQUID OF YOUR CHOICE AND DRINK TWICE DAILY  3689 g  2  . predniSONE (DELTASONE) 5 MG tablet Take 5 mg by mouth daily.        . rifaximin (XIFAXAN) 550 MG TABS Take 1 tablet (550 mg total) by mouth 2 (two) times daily.  180 tablet  3  . spironolactone (ALDACTONE) 50 MG tablet Take 50 mg by mouth daily.      . Thiamine HCl (VITAMIN B-1) 250 MG tablet 1/2 tablet by mouth once daily       . vitamin E 400 UNIT  capsule Take 400 Units by mouth daily.          History   Social History  . Marital Status: Married    Spouse Name: N/A    Number of Children: N/A  . Years of Education: N/A   Occupational History  .      retired   Social History Main Topics  . Smoking status: Former Smoker -- 2.0 packs/day for 20 years    Types: Cigarettes    Quit date: 02/22/1975  . Smokeless tobacco: Never Used  . Alcohol Use: No     Comment: none since Feb 2010  . Drug Use: No  . Sexually Active: Not on file   Other Topics Concern  . Not on file   Social History Narrative   Patient does not get regular exerciseQuit smoking over 40 years ago    Family History  Problem Relation Age of Onset  . Alzheimer's disease Mother   . Cancer Father     GI (stomach?)  . Heart disease Sister     2 sisters  . Liver disease Sister   . Diabetes Brother   . Heart disease Brother     3 brothers  . Liver disease Brother   . Heart attack Other     sibling  . Melanoma Other     sibling  . Stroke Other     sibling    Past Medical History  Diagnosis Date  . Diverticulosis   . Gastric antral vascular ectasia   . Rhinophyma   . Melanoma     resected by [facility] 2008  . CAD (coronary artery disease)     myoview 2007 no ischemia/ no ASA because of GI disease  . Atrial fibrillation     amiodarone in past, but problems and left off meds.  . Aortic valve sclerosis     echo, September, 2010, mild aortic insufficiency  . Visual loss, one eye     right eye-ophthalmic arterial branch occlusion  . Internal hemorrhoids   . GERD (gastroesophageal reflux disease)   . Osteoarthritis   . Diabetes mellitus, type 2   . Hypertension   . Allergic rhinitis   . Anemia     multifactorial  . Cirrhosis, alcoholic   . Thrombocytopenia   . Bleeding esophageal varices   .  Encephalopathy, unspecified   . Gout   . OSA (obstructive sleep apnea)     CPAP  . Short-segment Barrett's esophagus     suspected  . Venous  insufficiency   . Ejection fraction     EF 55%, echo, September, 2010  . Carotid bruit     Dopplers okay in the past  . Skin cancer     Basal cell and squamous cell  . Cellulitis   . CHF (congestive heart failure)     Past Surgical History  Procedure Date  . Total knee arthroplasty     right knee  . Esophagogastroduodenoscopy 04/10/2008    esophageal varices, cardia varix  . Tips procedure 2/10  . Laminectomy     x 4  . Hemorrhoid surgery   . Release scar contracture / graft repairs of hand     bilaterally   . Arm surgery     right   . Hiatal hernia repair   . Colonoscopy w/ polypectomy 02/14/2007    adenomatous polyps, diverticulosis, internal hemorrhoids/rectal varices  . Cataract extraction, bilateral January 2013  . Coronary artery bypass graft 1993    6 Bypass    Patient Active Problem List  Diagnosis  . HYPERLIPIDEMIA  . GOUT NOS  . ANEMIA, IRON DEFICIENCY  . THROMBOCYTOPENIA, CHRONIC  . ARTERIAL BRANCH OCCLUSION OF RETINA  . HYPERTENSION, ESSENTIAL NOS  . VENOUS INSUFFICIENCY, CHRONIC  . ALLERGIC RHINITIS  . GERD  . CIRRHOSIS, ALCOHOLIC  . Hepatic encephalopathy  . HYPERPLASIA, PRST NOS W/O URINARY OBST/LUTS  . Cellulitis and abscess of leg, except foot  . OSTEOARTHRITIS  . LOW BACK PAIN, CHRONIC  . SLEEP APNEA  .  ASCITES  . HYPERURICEMIA  . COLONIC POLYPS, ADENOMATOUS, HX OF  . CORONARY ARTERY BYPASS GRAFT, HX OF  . NEUROPATHY  . FOOT ULCER  . Paresthesia of foot  . Ejection fraction  . Aortic valve sclerosis  . Carotid bruit  . CAD (coronary artery disease)  . Atrial fibrillation  . Unspecified adverse effect of unspecified drug, medicinal and biological substance  . Type I (juvenile type) diabetes mellitus with peripheral circulatory disorders, uncontrolled  . Edema    ROS   Patient denies fever, chills, headache, sweats, rash, change in vision, change in hearing, chest pain, cough, nausea vomiting, urinary symptoms. All other systems are  reviewed and are negative.  PHYSICAL EXAM   Patient is overweight. He is here with his wife as always. He is growing a beard for the first time of the many many years I have known him. He is oriented to person time and place. Affect is normal. There is no jugulovenous distention. There are a few rales at the right base. Cardiac exam reveals S1 and S2. There is a 2/6 crescendo decrescendo systolic murmur along the left sternal border. Abdomen is protuberant. He has marked peripheral edema but in fact this has improved somewhat recently with his recent diuresis. He walks with a cane. There no skin rashes.  Filed Vitals:   03/12/12 0944  BP: 140/58  Pulse: 53  Height: 5\' 11"  (1.803 m)  Weight: 276 lb 12.8 oz (125.556 kg)   EKG is done today and reviewed by me. There is mild sinus bradycardia. There no marked EKG changes.  ASSESSMENT & PLAN

## 2012-03-12 NOTE — Assessment & Plan Note (Signed)
I agree that his edema is multifactorial. He is being diuresed. He is limiting his salt intake. He is not limiting his total fluid intake. I discussed this with the patient and his wife. I explained that he must limit his total fluid intake even if he feels thirsty. I do not think his edema is on a pure cardiac basis. He does have diastolic dysfunction.

## 2012-03-16 ENCOUNTER — Encounter: Payer: Self-pay | Admitting: Cardiology

## 2012-03-16 NOTE — Progress Notes (Signed)
   I was in communication with Dr. Leone Payor about the question of Coumadin for the patient. He felt that there was no contraindication related to liver disease at this time. However he mentioned that there had been question of some ocular bleeding problem in the past. Also the patient is a fall risk.  Considering these issues and the fact that we have seen no significant clinical atrial fibrillation over many many years, we both agree that it would be most prudent not to use Coumadin at this time.

## 2012-03-26 ENCOUNTER — Other Ambulatory Visit: Payer: Self-pay | Admitting: Internal Medicine

## 2012-03-26 ENCOUNTER — Other Ambulatory Visit (INDEPENDENT_AMBULATORY_CARE_PROVIDER_SITE_OTHER): Payer: Medicare Other

## 2012-03-26 DIAGNOSIS — K7030 Alcoholic cirrhosis of liver without ascites: Secondary | ICD-10-CM

## 2012-03-26 LAB — BASIC METABOLIC PANEL
BUN: 31 mg/dL — ABNORMAL HIGH (ref 6–23)
Calcium: 9.5 mg/dL (ref 8.4–10.5)
Chloride: 104 mEq/L (ref 96–112)
Creatinine, Ser: 1.9 mg/dL — ABNORMAL HIGH (ref 0.4–1.5)

## 2012-03-26 NOTE — Progress Notes (Signed)
Quick Note:  Kidney function has deteriorated  I had increased furosemide to 80 mg and spironolactone to 100 mg - med list does not reflect this - please clarify  Ask if edema any better  Go back to 40 mg furosemide and 50 mg spironolactone  Recheck BMET 2 weeks ______

## 2012-03-27 ENCOUNTER — Other Ambulatory Visit: Payer: Self-pay

## 2012-03-27 DIAGNOSIS — K7460 Unspecified cirrhosis of liver: Secondary | ICD-10-CM

## 2012-03-27 NOTE — Progress Notes (Signed)
Quick Note:  Ok will see what BMET tells [place] ______

## 2012-04-06 ENCOUNTER — Ambulatory Visit: Payer: Medicare Other | Admitting: Internal Medicine

## 2012-04-09 ENCOUNTER — Other Ambulatory Visit: Payer: Self-pay | Admitting: *Deleted

## 2012-04-09 DIAGNOSIS — I10 Essential (primary) hypertension: Secondary | ICD-10-CM

## 2012-04-09 MED ORDER — METOPROLOL TARTRATE 25 MG PO TABS: ORAL_TABLET | ORAL | Status: DC

## 2012-04-09 NOTE — Telephone Encounter (Signed)
Refill for metoprolol sent to [facility] on [address] in [place]

## 2012-04-10 ENCOUNTER — Other Ambulatory Visit (INDEPENDENT_AMBULATORY_CARE_PROVIDER_SITE_OTHER): Payer: Medicare Other

## 2012-04-10 DIAGNOSIS — K7460 Unspecified cirrhosis of liver: Secondary | ICD-10-CM

## 2012-04-10 LAB — BASIC METABOLIC PANEL
BUN: 22 mg/dL (ref 6–23)
Calcium: 9.4 mg/dL (ref 8.4–10.5)
GFR: 58.35 mL/min — ABNORMAL LOW (ref >60.00)
Glucose, Bld: 186 mg/dL — ABNORMAL HIGH (ref 70–99)
Sodium: 140 mEq/L (ref 135–145)

## 2012-04-10 NOTE — Progress Notes (Signed)
Quick Note:  Labs ok - kidney function back in normal range  Stay on current doses of diuretics and call back if legs getting larger and gaining weight (significant change) again.  I expect some gain but would like to avoid the legs getting real big again.  We could increase the medicines but not as much and hopefully reduce edema with less dehydration ______

## 2012-04-18 ENCOUNTER — Encounter: Payer: Self-pay | Admitting: Internal Medicine

## 2012-04-18 ENCOUNTER — Ambulatory Visit (INDEPENDENT_AMBULATORY_CARE_PROVIDER_SITE_OTHER): Payer: Medicare Other | Admitting: Internal Medicine

## 2012-04-18 VITALS — BP 140/60 | HR 76 | Ht 71.0 in | Wt 287.6 lb

## 2012-04-18 DIAGNOSIS — K7030 Alcoholic cirrhosis of liver without ascites: Secondary | ICD-10-CM

## 2012-04-18 DIAGNOSIS — R609 Edema, unspecified: Secondary | ICD-10-CM

## 2012-04-18 DIAGNOSIS — R1310 Dysphagia, unspecified: Secondary | ICD-10-CM

## 2012-04-18 MED ORDER — FUROSEMIDE 40 MG PO TABS: 60.0000 mg | ORAL_TABLET | Freq: Every day | ORAL | Status: DC

## 2012-04-18 MED ORDER — SPIRONOLACTONE 50 MG PO TABS: 100.0000 mg | ORAL_TABLET | Freq: Every day | ORAL | Status: DC

## 2012-04-18 NOTE — Patient Instructions (Addendum)
You have been scheduled for an endoscopy with propofol. Please follow written instructions given to you at your visit today. If you use inhalers (even only as needed) or a CPAP machine, please bring them with you on the day of your procedure.  If your blood sugar is extra high call us to be advised.  Dr. Leone Payor is changing your dosing on your generic Lasix to 1 and a half pills every day and take 2 tablets every day of spironolactone.  These rx's have been sent to your pharmacy.  Thank you for choosing me and [facility].  Iva Boop, M.D., [facility]  The printed rx's for generic lasix and spironolactone were not given to patient, Dr. Leone Payor said to hold off on a mail order supply for now because dose may change.  Patient understood.

## 2012-04-18 NOTE — Progress Notes (Signed)
Subjective:    Patient ID: [name], male    DOB: 1935-08-08, 77 y.o.   MRN: [id number]  HPI Fredrik Cove returns with a 3 week history of painful swallowing. Mid-sternal pain upon swallowing. Food goes down ok as do liquids and nothing obviously worse or better. Was to see me earlier but snow rescheduled. He is a little better he says.   Legs and weight are increasing off the higher dose of diuretics. Some increased dyspnea on exertion with this.  Allergies  Allergen Reactions  . Amoxicillin-Pot Clavulanate   . Aspirin     REACTION: unable to tolerate due to cirrhosis and varices  . Buprenorphine Hcl-Naloxone Hcl Other (See Comments)    unresponsive  . Codeine   . Ezetimibe     REACTION: LEG/JOINT PAIN  . Hydromorphone Hcl   . Meperidine Hcl   . Morphine   . Morphine Sulfate     REACTION: unspecified  . Oxycodone     hallucinations  . Shellfish-Derived Products     rash  . Sulfonamide Derivatives    Outpatient Prescriptions Prior to Visit  Medication Sig Dispense Refill  . allopurinol (ZYLOPRIM) 300 MG tablet Take 1/2 tablet once daily  45 tablet  0  . Ascorbic Acid (VITAMIN C) 500 MG tablet Take 500 mg by mouth daily.        . Calcium-Magnesium-Zinc (CALCIUM/MAGNESIUM/ZINC FORMULA) 1000-500-50 MG TABS Take 1 tablet by mouth daily.        . cholecalciferol (VITAMIN D) 1000 UNITS tablet Two tablets by mouth once daily       . diltiazem (CARTIA XT) 180 MG 24 hr capsule Take 1 capsule (180 mg total) by mouth daily.  90 capsule  3  . fentaNYL (DURAGESIC - DOSED MCG/HR) 50 MCG/HR Place 1 patch onto the skin every 3 (three) days.      . ferrous sulfate 325 (65 FE) MG tablet Take 325 mg by mouth 2 (two) times daily.        Marland Kitchen gabapentin (NEURONTIN) 100 MG capsule TAKE TWO CAPSULES BY MOUTH THREE TIMES DAILY  360 capsule  1  . glucose blood (FREESTYLE LITE) test strip 1 each by Other route 2 (two) times daily. And lancets 250.01  100 each  5  . insulin aspart (NOVOLOG) 100 UNIT/ML  injection Inject subcutaneously 30 units with breakfast, 30 units with lunch and 60 units with the evening meal and take 5 extra units with any blood sugar over 200      . insulin glargine (LANTUS) 100 UNIT/ML injection Inject 15 Units into the skin at bedtime.       . Insulin Syringe-Needle U-100 (RELION INSULIN SYRINGE 1ML/31G) 31G X 5/16" 1 ML MISC Use as directed four time daily dx 250.73  200 each  4  . isosorbide mononitrate (IMDUR) 60 MG 24 hr tablet Take 1 tablet (60 mg total) by mouth daily.  90 tablet  3  . lactulose (CHRONULAC) 10 GM/15ML solution Take 1 and 1/2 tablespoons two times a day  4050 mL  3  . Loratadine (CLARITIN) 10 MG CAPS Take by mouth as directed.        . metoprolol tartrate (LOPRESSOR) 25 MG tablet TAKE 1/2 TABLET TWICE DAILY  90 tablet  2  . omeprazole (PRILOSEC) 20 MG capsule Take 20 mg by mouth daily.        . penicillin v potassium (VEETID) 250 MG tablet Take 1 tablet (250 mg total) by mouth daily.  30 tablet  0  . polyethylene glycol powder (GLYCOLAX/MIRALAX) powder MIX 1 TO 1 & 1/2 CAPFUL IN LIQUID OF YOUR CHOICE AND DRINK TWICE DAILY  3689 g  2  . predniSONE (DELTASONE) 5 MG tablet Take 5 mg by mouth daily.        . rifaximin (XIFAXAN) 550 MG TABS Take 1 tablet (550 mg total) by mouth 2 (two) times daily.  180 tablet  3  . Thiamine HCl (VITAMIN B-1) 250 MG tablet 1/2 tablet by mouth once daily       . vitamin E 400 UNIT capsule Take 400 Units by mouth daily.        . furosemide (LASIX) 40 MG tablet Take 40 mg by mouth daily.      Marland Kitchen spironolactone (ALDACTONE) 50 MG tablet Take 50 mg by mouth daily.       No facility-administered medications prior to visit.   Past Medical History  Diagnosis Date  . Diverticulosis   . Gastric antral vascular ectasia   . Rhinophyma   . Melanoma     resected by [facility] 2008  . CAD (coronary artery disease)     myoview 2007 no ischemia/ no ASA because of GI disease  . Atrial fibrillation     amiodarone in past, but problems  and left off meds.  . Aortic stenosis     echo, September, 2010, mild aortic insufficiency  . Visual loss, one eye     right eye-ophthalmic arterial branch occlusion  . Internal hemorrhoids   . GERD (gastroesophageal reflux disease)   . Osteoarthritis   . Diabetes mellitus, type 2   . Hypertension   . Allergic rhinitis   . Anemia     multifactorial  . Cirrhosis, alcoholic   . Thrombocytopenia   . Bleeding esophageal varices   . Encephalopathy, unspecified   . Gout   . OSA (obstructive sleep apnea)     CPAP  . Short-segment Barrett's esophagus     suspected  . Venous insufficiency   . Ejection fraction     EF 55%, echo, September, 2010  . Carotid bruit     Dopplers okay in the past  . Skin cancer     Basal cell and squamous cell  . Cellulitis   . CHF (congestive heart failure)   . Sinus bradycardia     January, 2014  . Diastolic CHF     January, 2014   Past Surgical History  Procedure Laterality Date  . Total knee arthroplasty      right knee  . Esophagogastroduodenoscopy  04/10/2008    esophageal varices, cardia varix  . Tips procedure  2/10  . Laminectomy      x 4  . Hemorrhoid surgery    . Release scar contracture / graft repairs of hand      bilaterally   . Arm surgery      right   . Hiatal hernia repair    . Colonoscopy w/ polypectomy  02/14/2007    adenomatous polyps, diverticulosis, internal hemorrhoids/rectal varices  . Cataract extraction, bilateral  January 2013  . Coronary artery bypass graft  1993    6 Bypass    Review of Systems As above    Objective:   Physical Exam General:  Chronically ill Eyes:   Anicteric Mouth: No thrush Lungs:  Clear with crackles left base that clear with inspiration Heart:  S1S2  2/6 systolic murmur Abdomen:  Obese, soft and nontender, BS+, + ascites Ext:   3+ bilateral  LE edema with hose on edema    Data Reviewed:     Chemistry      Component Value Date/Time   NA 140 04/10/2012 0847   K 4.2 04/10/2012  0847   CL 105 04/10/2012 0847   CO2 28 04/10/2012 0847   BUN 22 04/10/2012 0847   CREATININE 1.3 04/10/2012 0847      Component Value Date/Time   CALCIUM 9.4 04/10/2012 0847   ALKPHOS 53 03/02/2012 0938   AST 26 03/02/2012 0938   ALT 16 03/02/2012 0938   BILITOT 1.4* 03/02/2012 0938     Wt Readings from Last 3 Encounters:  04/18/12 287 lb 9.6 oz (130.455 kg)  03/12/12 276 lb 12.8 oz (125.556 kg)  03/07/12 283 lb (128.368 kg)      Assessment & Plan:  Odynophagia - ? Esophageal candidiasis or otherinfection, GERD (worse from increasing ascites) , pill -  Plan: Ambulatory referral to Gastroenterology for diagnostic EGD - The risks and benefits as well as alternatives of endoscopic procedure(s) have been discussed and reviewed. All questions answered. The patient agrees to proceed.  Edema - increase furosemide to 60 mg daily and spironolactone to 100 mg daily - will arrange lab recheck in 1-2 weeks  Alcoholic cirrhosis of liver   Cc:Marga Melnick, MD

## 2012-04-19 ENCOUNTER — Ambulatory Visit (AMBULATORY_SURGERY_CENTER): Payer: Medicare Other | Admitting: Internal Medicine

## 2012-04-19 ENCOUNTER — Encounter: Payer: Self-pay | Admitting: Internal Medicine

## 2012-04-19 ENCOUNTER — Other Ambulatory Visit: Payer: Self-pay | Admitting: Internal Medicine

## 2012-04-19 VITALS — BP 123/59 | HR 56 | Temp 99.7°F | Resp 18 | Ht 71.0 in | Wt 287.0 lb

## 2012-04-19 DIAGNOSIS — K2210 Ulcer of esophagus without bleeding: Secondary | ICD-10-CM

## 2012-04-19 DIAGNOSIS — R1310 Dysphagia, unspecified: Secondary | ICD-10-CM

## 2012-04-19 DIAGNOSIS — D130 Benign neoplasm of esophagus: Secondary | ICD-10-CM

## 2012-04-19 LAB — GLUCOSE, CAPILLARY: Glucose-Capillary: 164 mg/dL — ABNORMAL HIGH (ref 70–99)

## 2012-04-19 MED ORDER — DEXLANSOPRAZOLE 60 MG PO CPDR: 60.0000 mg | DELAYED_RELEASE_CAPSULE | Freq: Every day | ORAL | Status: DC

## 2012-04-19 MED ORDER — SODIUM CHLORIDE 0.9 % IV SOLN: 500.0000 mL | INTRAVENOUS | Status: DC

## 2012-04-19 NOTE — Patient Instructions (Addendum)
There was an ulcer in the esophagus - not sure why but I took biopsies. I want you to take samples of Dexilant instead of your Prilosec (omeprazole) for next 2 weeks. We should call by next week with results. I do not think this is cancer.  Thank you for choosing me and [facility].  Iva Boop, MD, FACG  YOU HAD AN ENDOSCOPIC PROCEDURE TODAY AT THE [facility]: Refer to the procedure report that was given to you for any specific questions about what was found during the examination.  If the procedure report does not answer your questions, please call your gastroenterologist to clarify.  If you requested that your care partner not be given the details of your procedure findings, then the procedure report has been included in a sealed envelope for you to review at your convenience later.  YOU SHOULD EXPECT: Some feelings of bloating in the abdomen. Passage of more gas than usual.  Walking can help get rid of the air that was put into your GI tract during the procedure and reduce the bloating. If you had a lower endoscopy (such as a colonoscopy or flexible sigmoidoscopy) you may notice spotting of blood in your stool or on the toilet paper. If you underwent a bowel prep for your procedure, then you may not have a normal bowel movement for a few days.  DIET: Your first meal following the procedure should be a light meal and then it is ok to progress to your normal diet.  A half-sandwich or bowl of soup is an example of a good first meal.  Heavy or fried foods are harder to digest and may make you feel nauseous or bloated.  Likewise meals heavy in dairy and vegetables can cause extra gas to form and this can also increase the bloating.  Drink plenty of fluids but you should avoid alcoholic beverages for 24 hours.  ACTIVITY: Your care partner should take you home directly after the procedure.  You should plan to take it easy, moving slowly for the rest of the day.  You can  resume normal activity the day after the procedure however you should NOT DRIVE or use heavy machinery for 24 hours (because of the sedation medicines used during the test).    SYMPTOMS TO REPORT IMMEDIATELY: A gastroenterologist can be reached at any hour.  During normal business hours, 8:30 AM to 5:00 PM Monday through Friday, call [phone].  After hours and on weekends, please call the GI answering service at [phone] who will take a message and have the physician on call contact you.   Following upper endoscopy (EGD)  Vomiting of blood or coffee ground material  New chest pain or pain under the shoulder blades  Painful or persistently difficult swallowing  New shortness of breath  Fever of 100F or higher  Black, tarry-looking stools  FOLLOW UP: If any biopsies were taken you will be contacted by phone or by letter within the next 1-3 weeks.  Call your gastroenterologist if you have not heard about the biopsies in 3 weeks.  Our staff will call the home number listed on your records the next business day following your procedure to check on you and address any questions or concerns that you may have at that time regarding the information given to you following your procedure. This is a courtesy call and so if there is no answer at the home number and we have not heard from you through the  emergency physician on call, we will assume that you have returned to your regular daily activities without incident.  SIGNATURES/CONFIDENTIALITY: You and/or your care partner have signed paperwork which will be entered into your electronic medical record.  These signatures attest to the fact that that the information above on your After Visit Summary has been reviewed and is understood.  Full responsibility of the confidentiality of this discharge information lies with you and/or your care-partner.  Dexilant samples given

## 2012-04-19 NOTE — Progress Notes (Signed)
Patient did not experience any of the following events: a burn prior to discharge; a fall within the facility; wrong site/side/patient/procedure/implant event; or a hospital transfer or hospital admission upon discharge from the facility. ([id number])Patient did not have preoperative order for IV antibiotic SSI prophylaxis. [phone])   Kept pt a little longer to watch BP, BP had dropped a little and when pt went to sit up he felt a little dizzy, recheck BP twice and BP had come up to 123/59, advised pt to move slow, discharge by wheelchair

## 2012-04-19 NOTE — Op Note (Signed)
[facility] [address] [place], [address]   ENDOSCOPY PROCEDURE REPORT  PATIENT: [name], [name]  MR#: #[id number] BIRTHDATE: 06/23/1935 , 76  yrs. old GENDER: Male ENDOSCOPIST: Iva Boop, MD, [facility] PROCEDURE DATE:  04/19/2012 PROCEDURE:  Esophagoscopy  w/ biopsy ASA CLASS:     Class III INDICATIONS:  odynophagia. MEDICATIONS: propofol (Diprivan) 100mg  IV, MAC sedation, administered by CRNA, and These medications were titrated to patient response per physician's verbal order TOPICAL ANESTHETIC: Cetacaine Spray  DESCRIPTION OF PROCEDURE: After the risks benefits and alternatives of the procedure were thoroughly explained, informed consent was obtained.  The LB-GIF Q180 [id number] endoscope was introduced through the mouth and advanced to the stomach antrum. Without limitations. The instrument was slowly withdrawn as the mucosa was fully examined.        ESOPHAGUS: A medium sized deep, linear and clean-based ulcer was found in the middle third of the esophagus.  Biopsies were taken at edge of the ulcer and at the center of the ulcer.  The remainder of the upper endoscopy exam was otherwise normal. Retroflexed views revealed no abnormalities.     The scope was then withdrawn from the patient and the procedure completed.  COMPLICATIONS: There were no complications. ENDOSCOPIC IMPRESSION: 1.   Medium sized ulcer was found in the middle third of the esophagus 2.   The remainder of the upper endoscopy exam was otherwise normal exam to antrum  RECOMMENDATIONS: Increase PPI temporarily - Dexilant 60 mg qd. Await biopsies - wii call   eSigned:  Iva Boop, MD, [facility] 04/19/2012 4:46 PM   GN:[id number] Minerva Ends, MD and The Patient

## 2012-04-19 NOTE — Progress Notes (Signed)
Called to room to assist during endoscopic procedure.  Patient ID and intended procedure confirmed with present staff. Received instructions for my participation in the procedure from the performing physician.  

## 2012-04-20 ENCOUNTER — Telehealth: Payer: Self-pay | Admitting: *Deleted

## 2012-04-20 NOTE — Telephone Encounter (Signed)
  Follow up Call-  Call back number 04/19/2012  Post procedure Call Back phone  # [phone]  Permission to leave phone message Yes     Patient questions:  Do you have a fever, pain , or abdominal swelling? no Pain Score  3 *  Have you tolerated food without any problems? yes  Have you been able to return to your normal activities? yes  Do you have any questions about your discharge instructions: Diet   no Medications  no Follow up visit  no  Do you have questions or concerns about your Care? no  Actions: * If pain score is 4 or above: No action needed, pain <4. Pt did admit to some nagging chest discomfort 3 on pain scale,but pt says this is discomfort he was having before procedure . Encouraged pt to call back or call Dr. Leone Payor if this discomfort does not improve or gets worse than is now.

## 2012-04-26 ENCOUNTER — Other Ambulatory Visit: Payer: Self-pay

## 2012-04-26 ENCOUNTER — Other Ambulatory Visit (INDEPENDENT_AMBULATORY_CARE_PROVIDER_SITE_OTHER): Payer: Medicare Other

## 2012-04-26 ENCOUNTER — Encounter: Payer: Self-pay | Admitting: Internal Medicine

## 2012-04-26 DIAGNOSIS — K7460 Unspecified cirrhosis of liver: Secondary | ICD-10-CM

## 2012-04-26 LAB — BASIC METABOLIC PANEL
Calcium: 9.4 mg/dL (ref 8.4–10.5)
GFR: 48.96 mL/min — ABNORMAL LOW (ref >60.00)
Sodium: 139 mEq/L (ref 135–145)

## 2012-04-26 NOTE — Progress Notes (Signed)
Quick Note:  Need him to do a BMET here or through [facility] office (if closer - we could order?) Today if possible ______

## 2012-04-26 NOTE — Progress Notes (Signed)
Quick Note:  Labs ok  Please rx spironolactone and furosemide at current doses for 3 months with 1 refill  Repeat BMET in 1 month  REV 3 months  Give legs some more time to go down but if not better in 2 weeks can call back ______

## 2012-04-26 NOTE — Progress Notes (Signed)
Quick Note:  Biopsies ok How is he doing? Think he had a pill ulcer ______

## 2012-04-30 ENCOUNTER — Other Ambulatory Visit: Payer: Self-pay

## 2012-04-30 ENCOUNTER — Other Ambulatory Visit: Payer: Self-pay | Admitting: Internal Medicine

## 2012-04-30 ENCOUNTER — Other Ambulatory Visit: Payer: Self-pay | Admitting: *Deleted

## 2012-04-30 DIAGNOSIS — R188 Other ascites: Secondary | ICD-10-CM

## 2012-04-30 MED ORDER — SPIRONOLACTONE 50 MG PO TABS: 100.0000 mg | ORAL_TABLET | Freq: Every day | ORAL | Status: DC

## 2012-04-30 NOTE — Telephone Encounter (Signed)
Patient wife called to get a refill on the Penicillian that was given by Dr Ninetta Lights. Advised her that since we have not seen him in a long while and the Rx has not been filled since 12/2011 we would need to see the patient in order to fill the Rx. She advised to give her an appt I gave her 05/14/12 at 915 am

## 2012-04-30 NOTE — Telephone Encounter (Signed)
Spoke with patient's wife, informed her of new policy and patient to stop by the office to sign contract and pick up RX

## 2012-05-14 ENCOUNTER — Ambulatory Visit (INDEPENDENT_AMBULATORY_CARE_PROVIDER_SITE_OTHER): Payer: Medicare Other | Admitting: Infectious Diseases

## 2012-05-14 ENCOUNTER — Encounter: Payer: Self-pay | Admitting: Infectious Diseases

## 2012-05-14 VITALS — BP 136/59 | HR 59 | Temp 98.2°F | Ht 71.5 in | Wt 281.0 lb

## 2012-05-14 DIAGNOSIS — I872 Venous insufficiency (chronic) (peripheral): Secondary | ICD-10-CM

## 2012-05-14 DIAGNOSIS — L03119 Cellulitis of unspecified part of limb: Secondary | ICD-10-CM

## 2012-05-14 DIAGNOSIS — L02419 Cutaneous abscess of limb, unspecified: Secondary | ICD-10-CM

## 2012-05-14 MED ORDER — PENICILLIN V POTASSIUM 250 MG PO TABS: 250.0000 mg | ORAL_TABLET | Freq: Every day | ORAL | Status: DC

## 2012-05-14 NOTE — Progress Notes (Signed)
  Subjective:    Patient ID: [name], male    DOB: 1935-03-21, 77 y.o.   MRN: [id number]  HPI 77 yo M with hx of DM2, CABG, recurrent LE cullulitis, and alcoholic chirrhosis s/p tips.  Had 4 episodes of LE cellulitis 2010. He was seen in ID clinic for f/u and was started on oral Pen VK for prophylaxis of his recurrent episodes of cellulitis. He was given topical rx for his onychomycosis (terbinafine too high risk given his cirrhosis).   Has recently had his lasix does increased, this was complicated by increased Cr. Has been off his PEN over last 2 weeks (cannot tell that this made a difference). Has had increased swelling, weeping in his LLE. He has compression stockings, sleeps with his feet elevated.  Has been to wound center previously for ulcers, skin grafts.   Review of Systems  Constitutional: Negative for fever, chills, appetite change and unexpected weight change.  Genitourinary: Negative for difficulty urinating.       Objective:   Physical Exam  Constitutional: He appears well-developed and well-nourished.  HENT:  Mouth/Throat: No oropharyngeal exudate.  Eyes: EOM are normal. Pupils are equal, round, and reactive to light.  Neck: JVD present.  Cardiovascular: Normal rate and regular rhythm.   Murmur heard.  Systolic murmur is present with a grade of 3/6  Pulmonary/Chest: Effort normal and breath sounds normal.  Abdominal: Soft. Bowel sounds are normal. There is no tenderness.  Musculoskeletal:       Legs:         Assessment & Plan:

## 2012-05-14 NOTE — Assessment & Plan Note (Addendum)
Very complicated due to his TIPs, CHF, DM and previous CABG donor site (RLE). Now further complicated by worsening Cr with attempts to improve his diuresis. Will restart him on his previous PEN prophylaxis. Will have him seen by wound care center to see if they can help [place] get him a better handle on compression devices. Will see him back in 6 weeks.

## 2012-05-17 ENCOUNTER — Encounter (HOSPITAL_BASED_OUTPATIENT_CLINIC_OR_DEPARTMENT_OTHER): Payer: Medicare Other | Attending: Internal Medicine

## 2012-05-17 DIAGNOSIS — Z79899 Other long term (current) drug therapy: Secondary | ICD-10-CM | POA: Insufficient documentation

## 2012-05-17 DIAGNOSIS — I4891 Unspecified atrial fibrillation: Secondary | ICD-10-CM | POA: Insufficient documentation

## 2012-05-17 DIAGNOSIS — I2510 Atherosclerotic heart disease of native coronary artery without angina pectoris: Secondary | ICD-10-CM | POA: Insufficient documentation

## 2012-05-17 DIAGNOSIS — K766 Portal hypertension: Secondary | ICD-10-CM | POA: Insufficient documentation

## 2012-05-17 DIAGNOSIS — I509 Heart failure, unspecified: Secondary | ICD-10-CM | POA: Insufficient documentation

## 2012-05-17 DIAGNOSIS — Z951 Presence of aortocoronary bypass graft: Secondary | ICD-10-CM | POA: Insufficient documentation

## 2012-05-17 DIAGNOSIS — I890 Lymphedema, not elsewhere classified: Secondary | ICD-10-CM | POA: Insufficient documentation

## 2012-05-17 DIAGNOSIS — I87309 Chronic venous hypertension (idiopathic) without complications of unspecified lower extremity: Secondary | ICD-10-CM | POA: Insufficient documentation

## 2012-05-17 DIAGNOSIS — K7460 Unspecified cirrhosis of liver: Secondary | ICD-10-CM | POA: Insufficient documentation

## 2012-05-17 DIAGNOSIS — I872 Venous insufficiency (chronic) (peripheral): Secondary | ICD-10-CM | POA: Insufficient documentation

## 2012-05-18 NOTE — Progress Notes (Signed)
[facility]  NAMEKENYON, [name]                   ACCOUNT NO.:  [id number]  MEDICAL RECORD NO.:  [id number]      DATE OF BIRTH:  Mar 16, 1935  PHYSICIAN:  [name], M.D.      VISIT DATE:                                  OFFICE VISIT   [name] is a 77 year old man who arrives for our review of bilateral lower extremity edema.  I think referred by Dr. Ninetta Lights who had seen him 2 or 3 days ago.  This is a man with a very complex medical history including cirrhosis of the liver status post TIPS procedure, portal hypertension, coronary artery disease with a history of congestive heart failure status post CABG, chronic atrial fibrillation, and chronic edema.  We had last seen him in this clinic 2 years ago in 2012 at which point he had bilateral diabetic foot wounds which eventually closed over.  He was discharged with diabetic shoes.  He has apparently had problems with chronic venous stasis and venous hypertension and also a disturbing history of recurrent cellulitis for which I believe he follows Dr. Ninetta Lights.  He has been on penicillin prophylaxis.  He has been reviewed in 2013 by Vascular Surgery.  He was not felt to have a surgical correctable cause of his venous hypertension and chronic edema.  He did not have a DVT.  PAST MEDICAL HISTORY:  Extensive and his problem list is well represented in the [facility].  MEDICATION LIST:  Reviewed.  Currently on spironolactone 50 a day, Lasix 40 a day.  Apparently increases in his diuretics results an azotemia according to his wife.  We have not been able to get further control of his edema with increasing diuretics.  Last albumin when I see was normal at 3.5 on March 02, 2012, certainly not enough to account for any of the swelling.  REVIEW OF SYSTEMS:  RESPIRATORY:  He is not complaining of shortness of breath.  CARDIAC:  No clear chest pain. EXTREMITIES:  He does not have true  claudication.  EXAMINATION:  GENERAL:  The patient is not in any distress. VITAL SIGNS:  Temperature is 97.8, pulse 56, respirations 18, blood pressure 145/69.  Listed weight today is 280 pounds. RESPIRATORY:  Shallow but otherwise clear air entry. CARDIAC:  There is a 3/6 pansystolic murmur at the lower left sternal border.  His jugular venous pressure is elevated.  Tall V wave noted. EXTREMITIES:  His peripheral pulses are not palpable due to edema, however, his ABIs were measured in this clinic, and are normal bilaterally.  He had a normal capillary refill time.  Extremities as noted severe edema right greater than left.  There is some chronic erythema anteriorly bilaterally, however, no evidence of cellulitis present.  I did not see any open areas anteriorly and posteriorly reviewed with a mirror.  There were no open areas either. The edema extends well into his upper thighs right greater than left. No tenderness and no clear evidence of a DVT.  IMPRESSIONS:  Chronic edema in his lower extremities which is probably multifactorial but at least in part is probably chronic venous stasis and chronic lymphedema.  The left leg apparently has gotten worse recently according to the patient and his  wife.  He wears compression stockings.  However, these are over-the-counter, they are not prescription.  If he was going to try prescription stockings, he would not need at least 30 mmHg.  I gather increasing his diuretics has not resulted in any improvement and in fact, he has developed worsening renal function.  I think the alternative here would be external pressure pumps which I think he would certainly qualify for.  His chronic lymphedema and chronic venous stasis make him eligible for this.  I did wrap him today with Unna wraps bilaterally out of concern that he may be developing a pre-ulceration stage edema.  We will see him again in a week's time.           ______________________________ [name], M.D.     MGR/MEDQ  D:  05/17/2012  T:  05/18/2012  Job:  [id number]

## 2012-05-24 ENCOUNTER — Encounter (HOSPITAL_BASED_OUTPATIENT_CLINIC_OR_DEPARTMENT_OTHER): Payer: Medicare Other | Attending: Internal Medicine

## 2012-05-24 ENCOUNTER — Other Ambulatory Visit (INDEPENDENT_AMBULATORY_CARE_PROVIDER_SITE_OTHER): Payer: Medicare Other

## 2012-05-24 DIAGNOSIS — I872 Venous insufficiency (chronic) (peripheral): Secondary | ICD-10-CM | POA: Insufficient documentation

## 2012-05-24 DIAGNOSIS — I890 Lymphedema, not elsewhere classified: Secondary | ICD-10-CM | POA: Insufficient documentation

## 2012-05-24 DIAGNOSIS — R188 Other ascites: Secondary | ICD-10-CM

## 2012-05-24 LAB — BASIC METABOLIC PANEL
Chloride: 102 mEq/L (ref 96–112)
Creatinine, Ser: 1.4 mg/dL (ref 0.4–1.5)
Potassium: 4.2 mEq/L (ref 3.5–5.1)
Sodium: 136 mEq/L (ref 135–145)

## 2012-05-25 ENCOUNTER — Encounter: Payer: Self-pay | Admitting: Internal Medicine

## 2012-05-25 ENCOUNTER — Inpatient Hospital Stay (HOSPITAL_COMMUNITY)
Admission: EM | Admit: 2012-05-25 | Discharge: 2012-05-27 | DRG: 293 | Disposition: A | Discharge status: Home / Self Care | Payer: Medicare Other | Attending: Internal Medicine | Admitting: Internal Medicine

## 2012-05-25 ENCOUNTER — Emergency Department (HOSPITAL_COMMUNITY): Payer: Medicare Other

## 2012-05-25 ENCOUNTER — Ambulatory Visit (INDEPENDENT_AMBULATORY_CARE_PROVIDER_SITE_OTHER): Payer: Medicare Other | Admitting: Internal Medicine

## 2012-05-25 ENCOUNTER — Encounter (HOSPITAL_COMMUNITY): Payer: Self-pay | Admitting: *Deleted

## 2012-05-25 VITALS — BP 138/74 | HR 53 | Temp 98.0°F | Wt 285.0 lb

## 2012-05-25 DIAGNOSIS — S81809A Unspecified open wound, unspecified lower leg, initial encounter: Secondary | ICD-10-CM | POA: Diagnosis present

## 2012-05-25 DIAGNOSIS — R0989 Other specified symptoms and signs involving the circulatory and respiratory systems: Secondary | ICD-10-CM

## 2012-05-25 DIAGNOSIS — Z794 Long term (current) use of insulin: Secondary | ICD-10-CM

## 2012-05-25 DIAGNOSIS — L97509 Non-pressure chronic ulcer of other part of unspecified foot with unspecified severity: Secondary | ICD-10-CM

## 2012-05-25 DIAGNOSIS — I5033 Acute on chronic diastolic (congestive) heart failure: Principal | ICD-10-CM | POA: Diagnosis present

## 2012-05-25 DIAGNOSIS — Z951 Presence of aortocoronary bypass graft: Secondary | ICD-10-CM

## 2012-05-25 DIAGNOSIS — D509 Iron deficiency anemia, unspecified: Secondary | ICD-10-CM

## 2012-05-25 DIAGNOSIS — M545 Low back pain, unspecified: Secondary | ICD-10-CM

## 2012-05-25 DIAGNOSIS — R7989 Other specified abnormal findings of blood chemistry: Secondary | ICD-10-CM

## 2012-05-25 DIAGNOSIS — Z79899 Other long term (current) drug therapy: Secondary | ICD-10-CM

## 2012-05-25 DIAGNOSIS — G4733 Obstructive sleep apnea (adult) (pediatric): Secondary | ICD-10-CM | POA: Diagnosis present

## 2012-05-25 DIAGNOSIS — Z85820 Personal history of malignant melanoma of skin: Secondary | ICD-10-CM

## 2012-05-25 DIAGNOSIS — R202 Paresthesia of skin: Secondary | ICD-10-CM

## 2012-05-25 DIAGNOSIS — G589 Mononeuropathy, unspecified: Secondary | ICD-10-CM

## 2012-05-25 DIAGNOSIS — M109 Gout, unspecified: Secondary | ICD-10-CM

## 2012-05-25 DIAGNOSIS — E782 Mixed hyperlipidemia: Secondary | ICD-10-CM

## 2012-05-25 DIAGNOSIS — I2510 Atherosclerotic heart disease of native coronary artery without angina pectoris: Secondary | ICD-10-CM

## 2012-05-25 DIAGNOSIS — K7030 Alcoholic cirrhosis of liver without ascites: Secondary | ICD-10-CM

## 2012-05-25 DIAGNOSIS — D696 Thrombocytopenia, unspecified: Secondary | ICD-10-CM

## 2012-05-25 DIAGNOSIS — I359 Nonrheumatic aortic valve disorder, unspecified: Secondary | ICD-10-CM | POA: Diagnosis present

## 2012-05-25 DIAGNOSIS — I369 Nonrheumatic tricuspid valve disorder, unspecified: Secondary | ICD-10-CM

## 2012-05-25 DIAGNOSIS — Z87891 Personal history of nicotine dependence: Secondary | ICD-10-CM

## 2012-05-25 DIAGNOSIS — J209 Acute bronchitis, unspecified: Secondary | ICD-10-CM

## 2012-05-25 DIAGNOSIS — R9430 Abnormal result of cardiovascular function study, unspecified: Secondary | ICD-10-CM

## 2012-05-25 DIAGNOSIS — I509 Heart failure, unspecified: Secondary | ICD-10-CM

## 2012-05-25 DIAGNOSIS — Z860100 Personal history of colon polyps, unspecified: Secondary | ICD-10-CM

## 2012-05-25 DIAGNOSIS — K7682 Hepatic encephalopathy: Secondary | ICD-10-CM

## 2012-05-25 DIAGNOSIS — K7290 Hepatic failure, unspecified without coma: Secondary | ICD-10-CM

## 2012-05-25 DIAGNOSIS — T887XXA Unspecified adverse effect of drug or medicament, initial encounter: Secondary | ICD-10-CM

## 2012-05-25 DIAGNOSIS — E119 Type 2 diabetes mellitus without complications: Secondary | ICD-10-CM | POA: Diagnosis present

## 2012-05-25 DIAGNOSIS — K219 Gastro-esophageal reflux disease without esophagitis: Secondary | ICD-10-CM

## 2012-05-25 DIAGNOSIS — I498 Other specified cardiac arrhythmias: Secondary | ICD-10-CM | POA: Diagnosis present

## 2012-05-25 DIAGNOSIS — I872 Venous insufficiency (chronic) (peripheral): Secondary | ICD-10-CM

## 2012-05-25 DIAGNOSIS — X58XXXA Exposure to other specified factors, initial encounter: Secondary | ICD-10-CM | POA: Diagnosis present

## 2012-05-25 DIAGNOSIS — G4730 Sleep apnea, unspecified: Secondary | ICD-10-CM

## 2012-05-25 DIAGNOSIS — S81009A Unspecified open wound, unspecified knee, initial encounter: Secondary | ICD-10-CM | POA: Diagnosis present

## 2012-05-25 DIAGNOSIS — F1020 Alcohol dependence, uncomplicated: Secondary | ICD-10-CM | POA: Diagnosis present

## 2012-05-25 DIAGNOSIS — R0601 Orthopnea: Secondary | ICD-10-CM

## 2012-05-25 DIAGNOSIS — I350 Nonrheumatic aortic (valve) stenosis: Secondary | ICD-10-CM

## 2012-05-25 DIAGNOSIS — M1990 Unspecified osteoarthritis, unspecified site: Secondary | ICD-10-CM

## 2012-05-25 DIAGNOSIS — I5030 Unspecified diastolic (congestive) heart failure: Secondary | ICD-10-CM

## 2012-05-25 DIAGNOSIS — H5460 Unqualified visual loss, one eye, unspecified: Secondary | ICD-10-CM | POA: Diagnosis present

## 2012-05-25 DIAGNOSIS — R188 Other ascites: Secondary | ICD-10-CM

## 2012-05-25 DIAGNOSIS — J309 Allergic rhinitis, unspecified: Secondary | ICD-10-CM

## 2012-05-25 DIAGNOSIS — I10 Essential (primary) hypertension: Secondary | ICD-10-CM

## 2012-05-25 DIAGNOSIS — R609 Edema, unspecified: Secondary | ICD-10-CM

## 2012-05-25 DIAGNOSIS — N400 Enlarged prostate without lower urinary tract symptoms: Secondary | ICD-10-CM

## 2012-05-25 DIAGNOSIS — K648 Other hemorrhoids: Secondary | ICD-10-CM | POA: Diagnosis present

## 2012-05-25 DIAGNOSIS — E1059 Type 1 diabetes mellitus with other circulatory complications: Secondary | ICD-10-CM

## 2012-05-25 DIAGNOSIS — K5730 Diverticulosis of large intestine without perforation or abscess without bleeding: Secondary | ICD-10-CM | POA: Diagnosis present

## 2012-05-25 DIAGNOSIS — I4891 Unspecified atrial fibrillation: Secondary | ICD-10-CM

## 2012-05-25 DIAGNOSIS — IMO0002 Reserved for concepts with insufficient information to code with codable children: Secondary | ICD-10-CM

## 2012-05-25 DIAGNOSIS — R0600 Dyspnea, unspecified: Secondary | ICD-10-CM

## 2012-05-25 DIAGNOSIS — R001 Bradycardia, unspecified: Secondary | ICD-10-CM

## 2012-05-25 LAB — BASIC METABOLIC PANEL
CO2: 26 mEq/L (ref 19–32)
Calcium: 8.9 mg/dL (ref 8.4–10.5)
Creatinine, Ser: 1.4 mg/dL — ABNORMAL HIGH (ref 0.50–1.35)
GFR calc Af Amer: 56 mL/min — ABNORMAL LOW (ref >90)
GFR calc non Af Amer: 48 mL/min — ABNORMAL LOW (ref >90)
Sodium: 141 mEq/L (ref 135–145)

## 2012-05-25 LAB — CREATININE, SERUM
Creatinine, Ser: 1.52 mg/dL — ABNORMAL HIGH (ref 0.50–1.35)
GFR calc non Af Amer: 43 mL/min — ABNORMAL LOW (ref >90)

## 2012-05-25 LAB — CBC
Hemoglobin: 10.6 g/dL — ABNORMAL LOW (ref 13.0–17.0)
Hemoglobin: 11.3 g/dL — ABNORMAL LOW (ref 13.0–17.0)
MCHC: 33.5 g/dL (ref 30.0–36.0)
RBC: 3.3 MIL/uL — ABNORMAL LOW (ref 4.22–5.81)
RDW: 15.1 % (ref 11.5–15.5)

## 2012-05-25 LAB — TROPONIN I: Troponin I: <0.3 ng/mL (ref <0.30)

## 2012-05-25 LAB — D-DIMER, QUANTITATIVE: D-Dimer, Quant: 1.84 ug/mL-FEU — ABNORMAL HIGH (ref 0.00–0.48)

## 2012-05-25 LAB — POCT I-STAT TROPONIN I: Troponin i, poc: 0 ng/mL (ref 0.00–0.08)

## 2012-05-25 LAB — PRO B NATRIURETIC PEPTIDE: Pro B Natriuretic peptide (BNP): 521.2 pg/mL — ABNORMAL HIGH (ref 0–125)

## 2012-05-25 MED ORDER — IOHEXOL 350 MG/ML SOLN
100.0000 mL | Freq: Once | INTRAVENOUS | Status: AC | PRN
  Administered 2012-05-25: 100 mL via INTRAVENOUS

## 2012-05-25 MED ORDER — ONDANSETRON HCL 4 MG PO TABS: 4.0000 mg | ORAL_TABLET | Freq: Four times a day (QID) | ORAL | Status: DC | PRN

## 2012-05-25 MED ORDER — RIFAXIMIN 550 MG PO TABS
550.0000 mg | ORAL_TABLET | Freq: Two times a day (BID) | ORAL | Status: DC
  Administered 2012-05-25 – 2012-05-27 (×4): 550 mg via ORAL
  Filled 2012-05-25 (×5): qty 1

## 2012-05-25 MED ORDER — PANTOPRAZOLE SODIUM 40 MG PO TBEC
40.0000 mg | DELAYED_RELEASE_TABLET | Freq: Every day | ORAL | Status: DC
  Administered 2012-05-26 – 2012-05-27 (×2): 40 mg via ORAL
  Filled 2012-05-25: qty 1

## 2012-05-25 MED ORDER — IPRATROPIUM BROMIDE 0.02 % IN SOLN
0.5000 mg | Freq: Once | RESPIRATORY_TRACT | Status: AC
  Administered 2012-05-25: 0.5 mg via RESPIRATORY_TRACT
  Filled 2012-05-25: qty 2.5

## 2012-05-25 MED ORDER — SODIUM CHLORIDE 0.9 % IJ SOLN: 3.0000 mL | INTRAMUSCULAR | Status: DC | PRN

## 2012-05-25 MED ORDER — ONDANSETRON HCL 4 MG/2ML IJ SOLN: 4.0000 mg | Freq: Four times a day (QID) | INTRAMUSCULAR | Status: DC | PRN

## 2012-05-25 MED ORDER — DOCUSATE SODIUM 100 MG PO CAPS
100.0000 mg | ORAL_CAPSULE | Freq: Two times a day (BID) | ORAL | Status: DC
  Administered 2012-05-25 – 2012-05-27 (×4): 100 mg via ORAL
  Filled 2012-05-25 (×5): qty 1

## 2012-05-25 MED ORDER — INSULIN ASPART 100 UNIT/ML ~~LOC~~ SOLN
20.0000 [IU] | Freq: Three times a day (TID) | SUBCUTANEOUS | Status: DC
  Administered 2012-05-26 – 2012-05-27 (×5): 20 [IU] via SUBCUTANEOUS

## 2012-05-25 MED ORDER — ISOSORBIDE MONONITRATE ER 60 MG PO TB24
60.0000 mg | ORAL_TABLET | Freq: Every day | ORAL | Status: DC
  Administered 2012-05-26 – 2012-05-27 (×2): 60 mg via ORAL
  Filled 2012-05-25 (×2): qty 1

## 2012-05-25 MED ORDER — VITAMIN C 500 MG PO TABS
500.0000 mg | ORAL_TABLET | Freq: Every day | ORAL | Status: DC
  Administered 2012-05-26 – 2012-05-27 (×2): 500 mg via ORAL
  Filled 2012-05-25 (×2): qty 1

## 2012-05-25 MED ORDER — SPIRONOLACTONE 50 MG PO TABS: 50.0000 mg | ORAL_TABLET | Freq: Every day | ORAL | Status: DC

## 2012-05-25 MED ORDER — GABAPENTIN 300 MG PO CAPS
300.0000 mg | ORAL_CAPSULE | Freq: Two times a day (BID) | ORAL | Status: DC
  Administered 2012-05-25 – 2012-05-27 (×4): 300 mg via ORAL
  Filled 2012-05-25 (×5): qty 1

## 2012-05-25 MED ORDER — ALBUTEROL SULFATE (5 MG/ML) 0.5% IN NEBU
2.5000 mg | INHALATION_SOLUTION | Freq: Once | RESPIRATORY_TRACT | Status: AC
  Administered 2012-05-25: 2.5 mg via RESPIRATORY_TRACT
  Filled 2012-05-25: qty 1

## 2012-05-25 MED ORDER — INSULIN ASPART 100 UNIT/ML ~~LOC~~ SOLN
0.0000 [IU] | Freq: Three times a day (TID) | SUBCUTANEOUS | Status: DC
Start: 2012-05-26 — End: 2012-05-27
  Administered 2012-05-26 (×2): 5 [IU] via SUBCUTANEOUS
  Administered 2012-05-26 – 2012-05-27 (×3): 2 [IU] via SUBCUTANEOUS

## 2012-05-25 MED ORDER — FENTANYL 25 MCG/HR TD PT72: 50.0000 ug | MEDICATED_PATCH | TRANSDERMAL | Status: DC

## 2012-05-25 MED ORDER — ALBUTEROL SULFATE (5 MG/ML) 0.5% IN NEBU
2.5000 mg | INHALATION_SOLUTION | Freq: Four times a day (QID) | RESPIRATORY_TRACT | Status: DC
  Administered 2012-05-25: 2.5 mg via RESPIRATORY_TRACT
  Filled 2012-05-25: qty 0.5

## 2012-05-25 MED ORDER — ALBUTEROL SULFATE (5 MG/ML) 0.5% IN NEBU
2.5000 mg | INHALATION_SOLUTION | Freq: Two times a day (BID) | RESPIRATORY_TRACT | Status: DC
  Administered 2012-05-26 – 2012-05-27 (×3): 2.5 mg via RESPIRATORY_TRACT
  Filled 2012-05-25: qty 0.5

## 2012-05-25 MED ORDER — FUROSEMIDE 10 MG/ML IJ SOLN
40.0000 mg | Freq: Two times a day (BID) | INTRAMUSCULAR | Status: DC
  Administered 2012-05-25 – 2012-05-27 (×4): 40 mg via INTRAVENOUS
  Filled 2012-05-25 (×5): qty 4

## 2012-05-25 MED ORDER — SODIUM CHLORIDE 0.9 % IJ SOLN
3.0000 mL | Freq: Two times a day (BID) | INTRAMUSCULAR | Status: DC
  Administered 2012-05-25 – 2012-05-27 (×4): 3 mL via INTRAVENOUS

## 2012-05-25 MED ORDER — INSULIN GLARGINE 100 UNIT/ML ~~LOC~~ SOLN
15.0000 [IU] | Freq: Every day | SUBCUTANEOUS | Status: DC
  Administered 2012-05-25 – 2012-05-26 (×2): 15 [IU] via SUBCUTANEOUS
  Filled 2012-05-25 (×3): qty 0.15

## 2012-05-25 MED ORDER — METHYLPREDNISOLONE SODIUM SUCC 125 MG IJ SOLR
125.0000 mg | Freq: Once | INTRAMUSCULAR | Status: AC
  Administered 2012-05-25: 125 mg via INTRAVENOUS
  Filled 2012-05-25: qty 2

## 2012-05-25 MED ORDER — SODIUM CHLORIDE 0.9 % IJ SOLN: 3.0000 mL | Freq: Two times a day (BID) | INTRAMUSCULAR | Status: DC

## 2012-05-25 MED ORDER — SODIUM CHLORIDE 0.9 % IV SOLN: 250.0000 mL | INTRAVENOUS | Status: DC | PRN

## 2012-05-25 MED ORDER — FUROSEMIDE 10 MG/ML IJ SOLN
60.0000 mg | Freq: Once | INTRAMUSCULAR | Status: AC
  Administered 2012-05-25: 60 mg via INTRAVENOUS
  Filled 2012-05-25: qty 6

## 2012-05-25 MED ORDER — LACTULOSE 10 GM/15ML PO SOLN
20.0000 g | Freq: Two times a day (BID) | ORAL | Status: DC
  Administered 2012-05-25 – 2012-05-27 (×4): 20 g via ORAL
  Filled 2012-05-25 (×5): qty 30

## 2012-05-25 MED ORDER — FERROUS SULFATE 325 (65 FE) MG PO TABS
325.0000 mg | ORAL_TABLET | Freq: Two times a day (BID) | ORAL | Status: DC
  Administered 2012-05-25 – 2012-05-27 (×4): 325 mg via ORAL
  Filled 2012-05-25 (×5): qty 1

## 2012-05-25 MED ORDER — PREDNISONE 5 MG PO TABS
5.0000 mg | ORAL_TABLET | Freq: Every day | ORAL | Status: DC
  Administered 2012-05-26 – 2012-05-27 (×2): 5 mg via ORAL
  Filled 2012-05-25 (×3): qty 1

## 2012-05-25 MED ORDER — ALLOPURINOL 150 MG HALF TABLET
150.0000 mg | ORAL_TABLET | Freq: Every day | ORAL | Status: DC
  Administered 2012-05-26 – 2012-05-27 (×2): 150 mg via ORAL
  Filled 2012-05-25 (×3): qty 1

## 2012-05-25 MED ORDER — PENICILLIN V POTASSIUM 250 MG PO TABS
250.0000 mg | ORAL_TABLET | Freq: Every day | ORAL | Status: DC
  Administered 2012-05-26 – 2012-05-27 (×2): 250 mg via ORAL
  Filled 2012-05-25 (×3): qty 1

## 2012-05-25 MED ORDER — NITROGLYCERIN 2 % TD OINT
1.0000 [in_us] | TOPICAL_OINTMENT | Freq: Once | TRANSDERMAL | Status: AC
  Administered 2012-05-25: 1 [in_us] via TOPICAL
  Filled 2012-05-25: qty 1

## 2012-05-25 MED ORDER — ALBUTEROL SULFATE (5 MG/ML) 0.5% IN NEBU: 2.5000 mg | INHALATION_SOLUTION | RESPIRATORY_TRACT | Status: DC | PRN

## 2012-05-25 MED ORDER — LORATADINE 10 MG PO TABS
10.0000 mg | ORAL_TABLET | Freq: Every day | ORAL | Status: DC
  Administered 2012-05-26 – 2012-05-27 (×2): 10 mg via ORAL
  Filled 2012-05-25 (×3): qty 1

## 2012-05-25 MED ORDER — HEPARIN SODIUM (PORCINE) 5000 UNIT/ML IJ SOLN
5000.0000 [IU] | Freq: Three times a day (TID) | INTRAMUSCULAR | Status: DC
  Administered 2012-05-25 – 2012-05-27 (×5): 5000 [IU] via SUBCUTANEOUS
  Filled 2012-05-25 (×8): qty 1

## 2012-05-25 NOTE — ED Notes (Signed)
Pt with hx of chf and abd/LE swelling d/t liver stent placement to ED sent by Dr Alwyn Ren for increased sob when in supine position.  Pt with increased abd swelling and sob.  Pt unable to sleep in bed x 2 nights d/t inability to breathe when laying down.

## 2012-05-25 NOTE — ED Provider Notes (Signed)
History    CSN: [id number] Arrival date & time 05/25/12  1110 First MD Initiated Contact with Patient 05/25/12 1153      Chief Complaint  Patient presents with  . Shortness of Breath    Patient is a 77 y.o. male presenting with shortness of breath.  Shortness of Breath Severity:  Moderate Onset quality:  Gradual Timing:  Constant Progression:  Worsening Associated symptoms: cough   Associated symptoms: no fever    Pt sent over from PCP office.  Per Dr Frederik Pear note "Symptoms began 2 weeks ago as a dry, hacking cough associated with some dyspnea and wheezing. Approximately 5 days ago he began to produce yellow-brown sputum. He also had some head congestion and minor epistaxis.  The last 2 nights he has slept in a chair because of paroxysmal nocturnal dyspnea.  Symptoms were treated with OTC cough preparations as well as Claritin. "    Past Medical History  Diagnosis Date  . Diverticulosis   . Gastric antral vascular ectasia   . Rhinophyma   . Melanoma     resected by [facility] 2008  . CAD (coronary artery disease)     myoview 2007 no ischemia/ no ASA because of GI disease  . Atrial fibrillation     amiodarone in past, but problems and left off meds.  . Aortic stenosis     echo, September, 2010, mild aortic insufficiency  . Visual loss, one eye     right eye-ophthalmic arterial branch occlusion  . Internal hemorrhoids   . GERD (gastroesophageal reflux disease)   . Osteoarthritis   . Diabetes mellitus, type 2   . Hypertension   . Allergic rhinitis   . Anemia     multifactorial  . Cirrhosis, alcoholic   . Thrombocytopenia   . Bleeding esophageal varices   . Encephalopathy, unspecified   . Gout   . OSA (obstructive sleep apnea)     CPAP  . Short-segment Barrett's esophagus     suspected  . Venous insufficiency   . Ejection fraction     EF 55%, echo, September, 2010  . Carotid bruit     Dopplers okay in the past  . Skin cancer     Basal cell and squamous cell  .  Cellulitis   . CHF (congestive heart failure)   . Sinus bradycardia     January, 2014  . Diastolic CHF     January, 2014    Past Surgical History  Procedure Laterality Date  . Total knee arthroplasty      right knee  . Esophagogastroduodenoscopy  04/10/2008    esophageal varices, cardia varix  . Tips procedure  2/10  . Laminectomy      x 4  . Hemorrhoid surgery    . Release scar contracture / graft repairs of hand      bilaterally   . Arm surgery      right   . Hiatal hernia repair    . Colonoscopy w/ polypectomy  02/14/2007    adenomatous polyps, diverticulosis, internal hemorrhoids/rectal varices  . Cataract extraction, bilateral  January 2013  . Coronary artery bypass graft  1993    6 Bypass    Family History  Problem Relation Age of Onset  . Alzheimer's disease Mother   . Cancer Father     GI (stomach?)  . Heart disease Sister     2 sisters  . Liver disease Sister   . Diabetes Brother   . Heart disease Brother  3 brothers  . Liver disease Brother   . Heart attack Other     sibling  . Melanoma Other     sibling  . Stroke Other     sibling    History  Substance Use Topics  . Smoking status: Former Smoker -- 2.00 packs/day for 20 years    Types: Cigarettes    Quit date: 02/22/1975  . Smokeless tobacco: Never Used  . Alcohol Use: No     Comment: none since Feb 2010      Review of Systems  Constitutional: Negative for fever.  Respiratory: Positive for cough and shortness of breath.   All other systems reviewed and are negative.    Allergies  Amoxicillin-pot clavulanate; Aspirin; Buprenorphine hcl-naloxone hcl; Codeine; Ezetimibe; Hydromorphone hcl; Meperidine hcl; Morphine; Morphine sulfate; Oxycodone; Shellfish-derived products; and Sulfonamide derivatives  Home Medications   Current Outpatient Rx  Name  Route  Sig  Dispense  Refill  . allopurinol (ZYLOPRIM) 300 MG tablet      Take 1/2 tablet once daily   45 tablet   0   . Ascorbic  Acid (VITAMIN C) 500 MG tablet   Oral   Take 500 mg by mouth daily.           . Calcium-Magnesium-Zinc (CALCIUM/MAGNESIUM/ZINC FORMULA) 1000-500-50 MG TABS   Oral   Take 1 tablet by mouth daily.           Marland Kitchen dexlansoprazole (DEXILANT) 60 MG capsule   Oral   Take 1 capsule (60 mg total) by mouth daily.   90 capsule   3   . diltiazem (CARTIA XT) 180 MG 24 hr capsule   Oral   Take 1 capsule (180 mg total) by mouth daily.   90 capsule   3   . fentaNYL (DURAGESIC - DOSED MCG/HR) 50 MCG/HR   Transdermal   Place 1 patch onto the skin every 3 (three) days.         . ferrous sulfate 325 (65 FE) MG tablet   Oral   Take 325 mg by mouth 2 (two) times daily.           . furosemide (LASIX) 40 MG tablet   Oral   Take 1.5 tablets (60 mg total) by mouth daily.   135 tablet   3   . gabapentin (NEURONTIN) 100 MG capsule      TAKE TWO CAPSULES BY MOUTH THREE TIMES DAILY   360 capsule   0   . glucose blood (FREESTYLE LITE) test strip   Other   1 each by Other route 2 (two) times daily. And lancets 250.01   100 each   5   . insulin aspart (NOVOLOG) 100 UNIT/ML injection      Inject subcutaneously 30 units with breakfast, 30 units with lunch and 60 units with the evening meal and take 5 extra units with any blood sugar over 200         . insulin glargine (LANTUS) 100 UNIT/ML injection   Subcutaneous   Inject 15 Units into the skin at bedtime.          . Insulin Syringe-Needle U-100 (RELION INSULIN SYRINGE 1ML/31G) 31G X 5/16" 1 ML MISC      Use as directed four time daily dx 250.73   200 each   4   . isosorbide mononitrate (IMDUR) 60 MG 24 hr tablet   Oral   Take 1 tablet (60 mg total) by mouth daily.   90 tablet  3     Need to call and schedule follow up office visit t ...   . lactulose (CHRONULAC) 10 GM/15ML solution      Take 1 and 1/2 tablespoons two times a day   4050 mL   3   . Loratadine (CLARITIN) 10 MG CAPS   Oral   Take by mouth as directed.            . metoprolol tartrate (LOPRESSOR) 25 MG tablet      TAKE 1/2 TABLET TWICE DAILY   90 tablet   2   . omeprazole (PRILOSEC) 20 MG capsule   Oral   Take 20 mg by mouth daily.           . penicillin v potassium (VEETID) 250 MG tablet   Oral   Take 1 tablet (250 mg total) by mouth daily.   90 tablet   12     [employer] ID# [id number]   . polyethylene glycol powder (GLYCOLAX/MIRALAX) powder               . predniSONE (DELTASONE) 5 MG tablet   Oral   Take 5 mg by mouth daily.           . rifaximin (XIFAXAN) 550 MG TABS   Oral   Take 1 tablet (550 mg total) by mouth 2 (two) times daily.   180 tablet   3     Faxed signed Rx to [facility] at [phone]   . spironolactone (ALDACTONE) 50 MG tablet   Oral   Take 50 mg by mouth daily.         . Thiamine HCl (VITAMIN B-1) 250 MG tablet      1/2 tablet by mouth once daily            BP 129/58  Pulse 49  Temp(Src) 98 F (36.7 C) (Oral)  Resp 19  Ht 5' 11.5" (1.816 m)  Wt 285 lb (129.275 kg)  BMI 39.2 kg/m2  SpO2 98%  Physical Exam  Nursing note and vitals reviewed. Constitutional: No distress.  HENT:  Head: Normocephalic and atraumatic.  Right Ear: External ear normal.  Left Ear: External ear normal.  Eyes: Conjunctivae are normal. Right eye exhibits no discharge. Left eye exhibits no discharge. No scleral icterus.  Neck: Neck supple. No tracheal deviation present.  Cardiovascular: Normal rate, regular rhythm and intact distal pulses.   Pulmonary/Chest: Effort normal. No stridor. No respiratory distress. He has wheezes. He has no rales.  Few ronchi   Abdominal: Soft. Bowel sounds are normal. He exhibits no distension. There is no tenderness. There is no rebound and no guarding.  Musculoskeletal: He exhibits edema. He exhibits no tenderness.  Edema on right leg greater than left (chronic per patient, no change)  Neurological: He is alert. He has normal strength. No sensory deficit. Cranial  nerve deficit:  no gross defecits noted. He exhibits normal muscle tone. He displays no seizure activity. Coordination normal.  Skin: Skin is warm and dry. No rash noted. He is not diaphoretic.  Psychiatric: He has a normal mood and affect.    ED Course  Procedures (including critical care time) Medications  furosemide (LASIX) injection 60 mg (not administered)  nitroGLYCERIN (NITROGLYN) 2 % ointment 1 inch (not administered)  albuterol (PROVENTIL) (5 MG/ML) 0.5% nebulizer solution 2.5 mg (2.5 mg Nebulization Given 05/25/12 1238)  ipratropium (ATROVENT) nebulizer solution 0.5 mg (0.5 mg Nebulization Given 05/25/12 1238)  methylPREDNISolone sodium succinate (SOLU-MEDROL) 125 mg/2 mL  injection 125 mg (125 mg Intravenous Given 05/25/12 1238)  iohexol (OMNIPAQUE) 350 MG/ML injection 100 mL (100 mLs Intravenous Contrast Given 05/25/12 1423)    Labs Reviewed  CBC - Abnormal; Notable for the following:    RBC 3.30 (*)    Hemoglobin 10.6 (*)    HCT 31.1 (*)    Platelets 61 (*)    All other components within normal limits  PRO B NATRIURETIC PEPTIDE - Abnormal; Notable for the following:    Pro B Natriuretic peptide (BNP) 521.2 (*)    All other components within normal limits  BASIC METABOLIC PANEL - Abnormal; Notable for the following:    BUN 27 (*)    Creatinine, Ser 1.40 (*)    GFR calc non Af Amer 48 (*)    GFR calc Af Amer 56 (*)    All other components within normal limits  D-DIMER, QUANTITATIVE - Abnormal; Notable for the following:    D-Dimer, Quant 1.84 (*)    All other components within normal limits  POCT I-STAT TROPONIN I   Dg Chest 2 View  05/25/2012  *RADIOLOGY REPORT*  Clinical Data: Shortness of breath.  CHEST - 2 VIEW  Comparison: 06/22/2011  Findings: There is evidence of interstitial edema/congestive heart failure with no significant pleural effusions.  Heart is enlarged. No focal pulmonary consolidation.  The patient has had prior CABG.  IMPRESSION: Evidence of interstitial  edema/CHF.   Original Report Authenticated By: Irish Lack, M.D.    Ct Angio Chest W/cm &/or Wo Cm  05/25/2012  *RADIOLOGY REPORT*  Clinical Data: Dyspnea and chest tightness.  CT ANGIOGRAPHY CHEST  Technique:  Multidetector CT imaging of the chest using the standard protocol during bolus administration of intravenous contrast. Multiplanar reconstructed images including MIPs were obtained and reviewed to evaluate the vascular anatomy.  Contrast: OMNIPAQUE IOHEXOL 350 MG/ML SOLN  Comparison: 06/22/2011  Findings: There is no evidence of pulmonary embolism.  Lungs show venous hypertensive changes and mild interstitial edema with small bilateral pleural effusions, right greater than left.  No focal pulmonary consolidation.  The heart size is within normal limits.  No pericardial fluid is seen.  No nodules or enlarged lymph nodes.  IMPRESSION: Interstitial edema with small bilateral pleural effusions.  No evidence of pulmonary embolism.   Original Report Authenticated By: Irish Lack, M.D.     1. Congestive heart failure     MDM  Pt has been having orthopnea.  CXR shows pulmonary edema.  No sign of PE.  Will start lasix, ntg.  Plan on admission for diuresis, further treatment.        Celene Kras, MD 05/25/12 [id number]

## 2012-05-25 NOTE — Patient Instructions (Addendum)
Review and correct the record as indicated. Please share record with all medical staff seen.  

## 2012-05-25 NOTE — H&P (Addendum)
[facility] History and Physical  [name] UJW:[id number] DOB: 02-Feb-1936 DOA: 05/25/2012  Referring physician: Dr Nap.  PCP: Marga Melnick, MD    Chief Complaint: SOB.   HPI: [name] is a 77 y.o. male with multiple medical problems, diastolic heart failure, A fib, Diabetes, CAD, cirrhosis of liver who was transfer from PCP office for further evaluation of SOB. Patient presented to see his PCP because of worsening SOB over last couple of days. He describe orthopnea. He is also complaining of productive cough, that started 3 days prior to admission. He denies fever, chills. He has also notice increase abdomen distension over last 3 weeks. He relates abdominal pressure. He denies nausea, vomiting, abdominal pain. He has also notice increase weight.  Patient received a dose of lasix and nitroglycerin paste in the ED with improvement of symptoms.   Review of Systems: The patient denies anorexia, fever, weight loss,, vision loss, decreased hearing, hoarseness, chest pain, syncope,   balance deficits, hemoptysis, abdominal pain, melena, hematochezia, severe indigestion/heartburn, hematuria, incontinence, genital sores, muscle weakness, suspicious skin lesions, transient blindness, difficulty walking, depression.  Past Medical History  Diagnosis Date  . Diverticulosis   . Gastric antral vascular ectasia   . Rhinophyma   . Melanoma     resected by [facility] 2008  . CAD (coronary artery disease)     myoview 2007 no ischemia/ no ASA because of GI disease  . Atrial fibrillation     amiodarone in past, but problems and left off meds.  . Aortic stenosis     echo, September, 2010, mild aortic insufficiency  . Visual loss, one eye     right eye-ophthalmic arterial branch occlusion  . Internal hemorrhoids   . GERD (gastroesophageal reflux disease)   . Osteoarthritis   . Diabetes mellitus, type 2   . Hypertension   . Allergic rhinitis   . Anemia     multifactorial  . Cirrhosis, alcoholic    . Thrombocytopenia   . Bleeding esophageal varices   . Encephalopathy, unspecified   . Gout   . OSA (obstructive sleep apnea)     CPAP  . Short-segment Barrett's esophagus     suspected  . Venous insufficiency   . Ejection fraction     EF 55%, echo, September, 2010  . Carotid bruit     Dopplers okay in the past  . Skin cancer     Basal cell and squamous cell  . Cellulitis   . CHF (congestive heart failure)   . Sinus bradycardia     January, 2014  . Diastolic CHF     January, 2014   Past Surgical History  Procedure Laterality Date  . Total knee arthroplasty      right knee  . Esophagogastroduodenoscopy  04/10/2008    esophageal varices, cardia varix  . Tips procedure  2/10  . Laminectomy      x 4  . Hemorrhoid surgery    . Release scar contracture / graft repairs of hand      bilaterally   . Arm surgery      right   . Hiatal hernia repair    . Colonoscopy w/ polypectomy  02/14/2007    adenomatous polyps, diverticulosis, internal hemorrhoids/rectal varices  . Cataract extraction, bilateral  January 2013  . Coronary artery bypass graft  1993    6 Bypass   Social History:  reports that he quit smoking about 37 years ago. His smoking use included Cigarettes. He has a  40 pack-year smoking history. He has never used smokeless tobacco. He reports that he does not drink alcohol or use illicit drugs.   Allergies  Allergen Reactions  . Amoxicillin-Pot Clavulanate   . Aspirin     REACTION: unable to tolerate due to cirrhosis and varices  . Buprenorphine Hcl-Naloxone Hcl Other (See Comments)    unresponsive  . Codeine   . Ezetimibe     REACTION: LEG/JOINT PAIN  . Hydromorphone Hcl   . Meperidine Hcl   . Morphine   . Morphine Sulfate     REACTION: unspecified  . Oxycodone     hallucinations  . Shellfish-Derived Products     rash  . Sulfonamide Derivatives     Family History  Problem Relation Age of Onset  . Alzheimer's disease Mother   . Cancer Father      GI (stomach?)  . Heart disease Sister     2 sisters  . Liver disease Sister   . Diabetes Brother   . Heart disease Brother     3 brothers  . Liver disease Brother   . Heart attack Other     sibling  . Melanoma Other     sibling  . Stroke Other     sibling    Prior to Admission medications   Medication Sig Start Date End Date Taking? Authorizing Provider  allopurinol (ZYLOPRIM) 300 MG tablet Take 150 mg by mouth daily.   Yes Historical Provider, MD  Ascorbic Acid (VITAMIN C) 500 MG tablet Take 500 mg by mouth daily.     Yes Historical Provider, MD  Calcium-Magnesium-Zinc (CALCIUM/MAGNESIUM/ZINC FORMULA) 1000-500-50 MG TABS Take 1 tablet by mouth daily.     Yes Historical Provider, MD  diltiazem (CARTIA XT) 180 MG 24 hr capsule Take 1 capsule (180 mg total) by mouth daily. 03/12/12  Yes Luis Abed, MD  fentaNYL (DURAGESIC - DOSED MCG/HR) 50 MCG/HR Place 1 patch onto the skin every 3 (three) days.   Yes Historical Provider, MD  ferrous sulfate 325 (65 FE) MG tablet Take 325 mg by mouth 2 (two) times daily.     Yes Historical Provider, MD  furosemide (LASIX) 40 MG tablet Take 1.5 tablets (60 mg total) by mouth daily. 04/18/12  Yes Iva Boop, MD  gabapentin (NEURONTIN) 300 MG capsule Take 300 mg by mouth 3 (three) times daily.   Yes Historical Provider, MD  insulin aspart (NOVOLOG) 100 UNIT/ML injection Inject 30-60 Units into the skin 3 (three) times daily before meals. 30 units with breakfast, 30 units with lunch and 60 units with the evening meal and take 5 extra units with any blood sugar over 200 08/01/11  Yes Romero Belling, MD  insulin glargine (LANTUS) 100 UNIT/ML injection Inject 15 Units into the skin at bedtime.  08/01/11  Yes Romero Belling, MD  isosorbide mononitrate (IMDUR) 60 MG 24 hr tablet Take 1 tablet (60 mg total) by mouth daily. 03/12/12  Yes Luis Abed, MD  lactulose (CHRONULAC) 10 GM/15ML solution Take 20 g by mouth 2 (two) times daily.   Yes Historical Provider, MD   loratadine (CLARITIN) 10 MG tablet Take 10 mg by mouth daily.   Yes Historical Provider, MD  metoprolol tartrate (LOPRESSOR) 25 MG tablet Take 12.5 mg by mouth 2 (two) times daily.   Yes Historical Provider, MD  omeprazole (PRILOSEC) 20 MG capsule Take 20 mg by mouth daily.     Yes Historical Provider, MD  penicillin v potassium (VEETID) 250 MG tablet  Take 1 tablet (250 mg total) by mouth daily. 05/14/12  Yes Ginnie Smart, MD  predniSONE (DELTASONE) 5 MG tablet Take 5 mg by mouth daily.     Yes Historical Provider, MD  rifaximin (XIFAXAN) 550 MG TABS Take 1 tablet (550 mg total) by mouth 2 (two) times daily. 03/02/12  Yes Iva Boop, MD  spironolactone (ALDACTONE) 50 MG tablet Take 50 mg by mouth daily. 04/30/12  Yes Iva Boop, MD  Thiamine HCl (VITAMIN B-1) 250 MG tablet Take 125 mg by mouth daily.    Yes Historical Provider, MD  glucose blood (FREESTYLE LITE) test strip 1 each by Other route 2 (two) times daily. And lancets 250.01 08/01/11   Romero Belling, MD  Insulin Syringe-Needle U-100 (RELION INSULIN SYRINGE 1ML/31G) 31G X 5/16" 1 ML MISC Use as directed four time daily dx 250.73 11/18/11   Romero Belling, MD   Physical Exam: Filed Vitals:   05/25/12 1330 05/25/12 1500 05/25/12 1530 05/25/12 1640  BP: 129/58 128/71 140/58 141/61  Pulse: 49 53 52 53  Temp:    97.4 F (36.3 C)  TempSrc:    Oral  Resp: 19 9 16 18   Height:    6' (1.829 m)  Weight:    126 kg (277 lb 12.5 oz)  SpO2: 98% 99% 99% 97%     General:  Alert and oriented in no acute distress.   Eyes: PERLA, an icteric.  ENT: no tonsillar enlargement no erythema.   Neck: supple, no thyromegaly, positive JVD.   Cardiovascular: S 1, S 2 RRR  Respiratory: Crackles at bases, no wheezes.   Abdomen: BS present, soft, NT, ND  Skin: ecchymosis.   Musculoskeletal: Bilateral lower extremities with dressing, he has chronic wound. Right lower extremity significant increase size compare to left, chronic per patient.    Psychiatric: Normal Affect  Neurologic: non focal.   Labs on Admission:  Basic Metabolic Panel:  Recent Labs Lab 05/24/12 0915 05/25/12 1149  NA 136 141  K 4.2 3.9  CL 102 106  CO2 27 26  GLUCOSE 178* 90  BUN 25* 27*  CREATININE 1.4 1.40*  CALCIUM 8.4 8.9   Liver Function Tests: No results found for this basename: AST, ALT, ALKPHOS, BILITOT, PROT, ALBUMIN,  in the last 168 hours No results found for this basename: LIPASE, AMYLASE,  in the last 168 hours No results found for this basename: AMMONIA,  in the last 168 hours CBC:  Recent Labs Lab 05/25/12 1149  WBC 4.8  HGB 10.6*  HCT 31.1*  MCV 94.2  PLT 61*   Cardiac Enzymes: No results found for this basename: CKTOTAL, CKMB, CKMBINDEX, TROPONINI,  in the last 168 hours  BNP (last 3 results)  Recent Labs  06/22/11 2130 03/02/12 0938 05/25/12 1149  PROBNP 302.5* 395.0* 521.2*   CBG: No results found for this basename: GLUCAP,  in the last 168 hours  Radiological Exams on Admission: Dg Chest 2 View  05/25/2012  *RADIOLOGY REPORT*  Clinical Data: Shortness of breath.  CHEST - 2 VIEW  Comparison: 06/22/2011  Findings: There is evidence of interstitial edema/congestive heart failure with no significant pleural effusions.  Heart is enlarged. No focal pulmonary consolidation.  The patient has had prior CABG.  IMPRESSION: Evidence of interstitial edema/CHF.   Original Report Authenticated By: Irish Lack, M.D.    Ct Angio Chest W/cm &/or Wo Cm  05/25/2012  *RADIOLOGY REPORT*  Clinical Data: Dyspnea and chest tightness.  CT ANGIOGRAPHY CHEST  Technique:  Multidetector CT imaging of the chest using the standard protocol during bolus administration of intravenous contrast. Multiplanar reconstructed images including MIPs were obtained and reviewed to evaluate the vascular anatomy.  Contrast: OMNIPAQUE IOHEXOL 350 MG/ML SOLN  Comparison: 06/22/2011  Findings: There is no evidence of pulmonary embolism.  Lungs show  venous hypertensive changes and mild interstitial edema with small bilateral pleural effusions, right greater than left.  No focal pulmonary consolidation.  The heart size is within normal limits.  No pericardial fluid is seen.  No nodules or enlarged lymph nodes.  IMPRESSION: Interstitial edema with small bilateral pleural effusions.  No evidence of pulmonary embolism.   Original Report Authenticated By: Irish Lack, M.D.     EKG: Independently reviewed. Sinus bradycardia.   Assessment/Plan Active Problems:   THROMBOCYTOPENIA, CHRONIC   HYPERTENSION, ESSENTIAL NOS   CIRRHOSIS, ALCOHOLIC    ASCITES   Atrial fibrillation   Sinus bradycardia   Congestive heart failure  1-Diastolic Heart Failure exacerbation: Patient presents with dyspnea, increase weight, orthopnea, chest x ray with pulmonary edema; mildly increase BNP. Will admit to telemetry, cycle cardiac enzymes. IV lasix 40 mg IV BID. Will need to adjust lasix for renal function. Will evaluate for liver failure. Will order ECHO.  2-History of cirrhosis: Patient with ascitis, fluid retention. Will order abdominal US, LFT. Hold spironolactone tonight, repeat renal function in am and consider resume spironolactone. Patient received contrast for CT angio. Continue with Rifaximin, lactulose.  3-Chronic Thrombocytopenia: Stable.  4-Chronic Bilateral LE wound: Patient follow by Dr Ninetta Lights, with ID. He takes chronic penicillin. He started to follow with wound clinic. He has bilateral dressing LE. Per wife dressing were change yesterday and wound looks better.  5-Increase D dimer: CT angio negative for PE. Will order doppler LE.  patient notice mild worsening swelling right leg.  6-Diabetes.; Continue with lantus. Will decrease meal coverage to 20 units. Adjust insulin as needed.  7-History of A fib. EKG sinus. I will hold metoprolol and diltiazem due to bradycardia. HR at 40 on admission. Will need to monitor HR and consider resume low dose  metoprolol.     Code Status: Partial Code. Does not want to be intubated.  Family Communication: Care discussed with wife who was at bedside.  Disposition Plan: expect 3 to 4 days inpatient.   Time spent: 60 minutes.   Cherrelle Plante [facility] Pager [phone]  If 7PM-7AM, please contact night-coverage [email or web address] Password [email or web address] 05/25/2012, 5:02 PM

## 2012-05-25 NOTE — Progress Notes (Signed)
  Echocardiogram 2D Echocardiogram has been performed.  [name] FRANCES 05/25/2012, 6:45 PM

## 2012-05-25 NOTE — ED Notes (Signed)
Reports progressively worsening pitting edema to BLE extending up into abd past week, difficulty breathing when laying flat. Denies CP. States has had prod cough x 3 days, denies fever. Also states unable to tolerate his CPAP at night lately.

## 2012-05-25 NOTE — ED Notes (Addendum)
Patient transported to X-ray / CT scan. 

## 2012-05-25 NOTE — Progress Notes (Signed)
  Subjective:    Patient ID: [name], male    DOB: 1935-12-04, 77 y.o.   MRN: [id number]  HPI  Symptoms began 2 weeks ago as a dry, hacking cough associated with some dyspnea and wheezing. Approximately 5 days ago he began to produce yellow-brown sputum. He also had some head congestion and minor epistaxis.  The last 2 nights he has slept in a chair because of paroxysmal nocturnal dyspnea.  Symptoms were treated with diabetic OTC cough preparations  as well as Claritin.    Review of Systems He did not have significant extrinsic symptoms of itchy, watery eyes, or sneezing.  He has not had definite frontal headache, facial pain, dental pain, otic pain, or otic discharge. Sore throat was minor and limited.  He denies chest pain or palpitations. He does have chronic discomfort in his legs which is unrelated to the present illness. [facility] from venous insufficiency.  He is on penicillin 250 mg daily prophylactically from Dr. Ninetta Lights, infectious disease for recurrent cellulitis     Objective:   Physical Exam General appearance:adequately nourished; no acute distress or increased work of breathing is present.  No  lymphadenopathy about the head, neck, or axilla noted.   Eyes: No conjunctival inflammation or lid edema is present. There is no scleral icterus.  Ears:  External ear exam shows no significant lesions or deformities.  Otoscopic examination reveals clear canals, tympanic membranes are intact bilaterally without bulging, retraction, inflammation or discharge.  Nose:  External nasal examination shows chronicpitting deformity w/o inflammation. There is a small collection dry blood over the right septum Oral exam: Dental hygiene is good; lips and gums are healthy appearing.There is no oropharyngeal erythema or exudate noted.   Neck:  No deformities, thyromegaly, masses, or tenderness noted.   Decreased lateral range of motion . It is  difficult to ascertain neck vein distention at 15  Heart:  Slow rate and regular rhythm. S1 and S2 normal without gallop,  click, rub or other extra sounds. Grade 2-2.5 right base systolic murmur with carotid radiation.  Lungs:Chest clear to auscultation; no wheezes, rhonchi,rales ,or rubs present sitting.No increased work of breathing sitting.    Immediately upon reclining 15 he develops facial plethora and coarse wheezing over both upper airways anteriorly. The maneuver was repeated and the findings were unchanged. O2 sats did not drop.  Extremities:  No cyanosis or clubbing  noted . Chronic gross  Lower extremity edema bilaterally.   Skin: Warm & dry w/o jaundice or tenting. Hyperpigmentation of the forearms. Lower extremity cannot be evaluated due to wrapping         Assessment & Plan:  #1 positional airway obstruction with dramatic rhonchi and wheezing and associated facial plethora; ? Vascular such as SVC syndrome  rather than RAD (no PMH of asthma). This would explain why he has had to sleep sitting up. Imaging will be necessary to determine the etiology of the process as it is positional.  Extensive lab testing will also be necessary to include BNP. Because of the dramatic symptoms when supine; I recommend observation admission.

## 2012-05-25 NOTE — Progress Notes (Signed)
Quick Note:  Kidney function is hanging in there ok How is the edema? ______

## 2012-05-26 ENCOUNTER — Inpatient Hospital Stay (HOSPITAL_COMMUNITY): Payer: Medicare Other

## 2012-05-26 DIAGNOSIS — E1059 Type 1 diabetes mellitus with other circulatory complications: Secondary | ICD-10-CM

## 2012-05-26 DIAGNOSIS — I498 Other specified cardiac arrhythmias: Secondary | ICD-10-CM

## 2012-05-26 DIAGNOSIS — I10 Essential (primary) hypertension: Secondary | ICD-10-CM

## 2012-05-26 DIAGNOSIS — M7989 Other specified soft tissue disorders: Secondary | ICD-10-CM

## 2012-05-26 LAB — CBC
Platelets: 52 10*3/uL — ABNORMAL LOW (ref 150–400)
RDW: 14.8 % (ref 11.5–15.5)
WBC: 3.2 10*3/uL — ABNORMAL LOW (ref 4.0–10.5)

## 2012-05-26 LAB — COMPREHENSIVE METABOLIC PANEL
Alkaline Phosphatase: 63 U/L (ref 39–117)
BUN: 36 mg/dL — ABNORMAL HIGH (ref 6–23)
CO2: 26 mEq/L (ref 19–32)
Chloride: 101 mEq/L (ref 96–112)
GFR calc Af Amer: 52 mL/min — ABNORMAL LOW (ref >90)
GFR calc non Af Amer: 44 mL/min — ABNORMAL LOW (ref >90)
Glucose, Bld: 316 mg/dL — ABNORMAL HIGH (ref 70–99)
Potassium: 4.4 mEq/L (ref 3.5–5.1)
Total Bilirubin: 0.7 mg/dL (ref 0.3–1.2)
Total Protein: 6.3 g/dL (ref 6.0–8.3)

## 2012-05-26 LAB — TROPONIN I
Troponin I: <0.3 ng/mL (ref <0.30)
Troponin I: <0.3 ng/mL (ref <0.30)

## 2012-05-26 MED ORDER — NITROGLYCERIN 0.4 MG SL SUBL
SUBLINGUAL_TABLET | SUBLINGUAL | Status: AC
  Administered 2012-05-26: 07:00:00
  Filled 2012-05-26: qty 25

## 2012-05-26 NOTE — Progress Notes (Signed)
*  PRELIMINARY RESULTS* Vascular Ultrasound Lower extremity venous duplex has been completed.  Preliminary findings: Bilaterally no evidence of DVT.  Farrel Demark, RDMS, RVT  05/26/2012, 10:48 AM

## 2012-05-26 NOTE — Progress Notes (Signed)
[facility] PROGRESS NOTE  [name] ZOX:[id number] DOB: Nov 17, 1935 DOA: 05/25/2012 PCP: Marga Melnick, MD  Assessment/Plan: -Diastolic Heart Failure exacerbation: Patient presented with dyspnea, increase weight, orthopnea, chest x ray with pulmonary edema; mildly increase BNP. Will admit to telemetry, cycle cardiac enzymes. -await ECHO, cardiac enzymes so far neg. CT angio neg for PE -no significant change in Cr overnight, will continue current IV lasix -duresing well, 2L out overnight 2-History of cirrhosis: Patient with ascitis, fluid retention.  -await [place], LFT.  -Hold spironolactone again today, if renal function continues to be table will resume spironolactone. Patient received contrast for CT angio on admission.  -Continue with Rifaximin, lactulose.  3-Chronic Thrombocytopenia: Stable.  4-Chronic Bilateral LE wound: Patient follow by Dr Ninetta Lights, with ID. He takes chronic penicillin. He started to follow with wound clinic. He has bilateral dressing LE. Per wife dressing were change yesterday and wound looks better.  5-Increase D dimer: CT angio negative for PE.  -doppler LE neg.  6-Diabetes:  -Continue with lantus 20 units. -follow accuchecks and Adjust insulin as needed.  7-History of A fib. EKG sinus. metoprolol and diltiazem on hold due to bradycardia. HR at 40 on admission.  -HR improving today, follow if continues to improve, Will resume low dose metoprolol in am and follow.    Code Status: Partial code, does not want tobe intubated Family Communication: wife at bedside Disposition Plan: to home when medically stable   Consultants:  none  Procedures:  none    HPI/Subjective: States breathing better today, denies CP. Family at bedside  Objective: Filed Vitals:   05/26/12 8119 05/26/12 0634 05/26/12 0638 05/26/12 0948  BP: 149/62 128/56 144/58   Pulse: 58 57 58   Temp: 98.1 F (36.7 C)     TempSrc: Oral     Resp: 24 20 22    Height:      Weight:       SpO2: 97% 98% 98% 98%    Intake/Output Summary (Last 24 hours) at 05/26/12 1126 Last data filed at 05/25/12 2159  Gross per 24 hour  Intake    220 ml  Output   2550 ml  Net  -2330 ml   Filed Weights   05/25/12 1133 05/25/12 1640  Weight: 129.275 kg (285 lb) 126 kg (277 lb 12.5 oz)    Exam:   General: elderly male, alert and oriented x3 in NAD  Cardiovascular: RRR,+systolic murmur  Respiratory: crackles at bases  Abdomen: soft +BS NT/ND  Extremities: R>L edema  Data Reviewed: Basic Metabolic Panel:  Recent Labs Lab 05/24/12 0915 05/25/12 1149 05/25/12 2146 05/26/12 0428  NA 136 141  --  137  K 4.2 3.9  --  4.4  CL 102 106  --  101  CO2 27 26  --  26  GLUCOSE 178* 90  --  316*  BUN 25* 27*  --  36*  CREATININE 1.4 1.40* 1.52* 1.49*  CALCIUM 8.4 8.9  --  8.8   Liver Function Tests:  Recent Labs Lab 05/26/12 0428  AST 24  ALT 12  ALKPHOS 63  BILITOT 0.7  PROT 6.3  ALBUMIN 3.3*   No results found for this basename: LIPASE, AMYLASE,  in the last 168 hours No results found for this basename: AMMONIA,  in the last 168 hours CBC:  Recent Labs Lab 05/25/12 1149 05/25/12 2146 05/26/12 0428  WBC 4.8 4.0 3.2*  HGB 10.6* 11.3* 10.4*  HCT 31.1* 33.7* 30.7*  MCV 94.2 94.4 92.7  PLT  61* 55* 52*   Cardiac Enzymes:  Recent Labs Lab 05/25/12 2146 05/26/12 0428 05/26/12 1001  TROPONINI <0.30 <0.30 <0.30   BNP (last 3 results)  Recent Labs  06/22/11 2130 03/02/12 0938 05/25/12 1149  PROBNP 302.5* 395.0* 521.2*   CBG:  Recent Labs Lab 05/25/12 2313 05/26/12 0612  GLUCAP 372* 282*    No results found for this or any previous visit (from the past 240 hour(s)).   Studies: Dg Chest 2 View  05/25/2012  *RADIOLOGY REPORT*  Clinical Data: Shortness of breath.  CHEST - 2 VIEW  Comparison: 06/22/2011  Findings: There is evidence of interstitial edema/congestive heart failure with no significant pleural effusions.  Heart is enlarged. No focal  pulmonary consolidation.  The patient has had prior CABG.  IMPRESSION: Evidence of interstitial edema/CHF.   Original Report Authenticated By: Irish Lack, M.D.    Ct Angio Chest W/cm &/or Wo Cm  05/25/2012  *RADIOLOGY REPORT*  Clinical Data: Dyspnea and chest tightness.  CT ANGIOGRAPHY CHEST  Technique:  Multidetector CT imaging of the chest using the standard protocol during bolus administration of intravenous contrast. Multiplanar reconstructed images including MIPs were obtained and reviewed to evaluate the vascular anatomy.  Contrast: OMNIPAQUE IOHEXOL 350 MG/ML SOLN  Comparison: 06/22/2011  Findings: There is no evidence of pulmonary embolism.  Lungs show venous hypertensive changes and mild interstitial edema with small bilateral pleural effusions, right greater than left.  No focal pulmonary consolidation.  The heart size is within normal limits.  No pericardial fluid is seen.  No nodules or enlarged lymph nodes.  IMPRESSION: Interstitial edema with small bilateral pleural effusions.  No evidence of pulmonary embolism.   Original Report Authenticated By: Irish Lack, M.D.    US Abdomen Complete  05/26/2012  *RADIOLOGY REPORT*  Clinical Data:  Abdominal distention and cirrhosis.  History of prior TIPS procedure.  ABDOMEN ULTRASOUND  Technique:  Complete abdominal ultrasound examination was performed including evaluation of the liver, gallbladder, bile ducts, pancreas, kidneys, spleen, IVC, and abdominal aorta.  Comparison:  Most recent TIPS Doppler study dated 07/26/2011.  Findings:  Gallbladder:  There are some small calculi in the gallbladder.  No evidence of gallbladder wall thickening or sonographic Murphy's sign.  Common Bile Duct:  Normal caliber of 4 mm.  Liver:  Heterogeneous liver echotexture is consistent with known cirrhosis.  No focal masses are identified by ultrasound. The TIPS shunt was not formally evaluated.  Flow is seen into the shunt at the proximal/portal venous end.  IVC:   Patent throughout its visualized course in the abdomen.  Pancreas:  The pancreas is not visualized by ultrasound.  Spleen:  The spleen is enlarged with estimated volume of 1027 ml.  Kidneys:  The right kidney measures 11.7 cm and the left kidney 12.1 cm.  Both show no evidence of hydronephrosis or focal lesion.  Abdominal Aorta:  The abdominal aorta is completely non visible by ultrasound.  No ascites is identified.  IMPRESSION:  1.  Cholelithiasis with small gallstones identified.  No associated signs of cholecystitis or biliary obstruction. 2. Cirrhosis and splenomegaly.  No ascites identified.   Original Report Authenticated By: Irish Lack, M.D.     Scheduled Meds: . albuterol  2.5 mg Nebulization BID  . allopurinol  150 mg Oral Daily  . docusate sodium  100 mg Oral BID  . [START ON 05/28/2012] fentaNYL  50 mcg Transdermal Q72H  . ferrous sulfate  325 mg Oral BID  . furosemide  40 mg Intravenous  Q12H  . gabapentin  300 mg Oral BID  . heparin  5,000 Units Subcutaneous Q8H  . insulin aspart  0-9 Units Subcutaneous TID WC  . insulin aspart  20 Units Subcutaneous TID AC  . insulin glargine  15 Units Subcutaneous QHS  . isosorbide mononitrate  60 mg Oral Daily  . lactulose  20 g Oral BID  . loratadine  10 mg Oral Daily  . pantoprazole  40 mg Oral Daily  . penicillin v potassium  250 mg Oral Daily  . predniSONE  5 mg Oral Daily  . rifaximin  550 mg Oral BID  . sodium chloride  3 mL Intravenous Q12H  . vitamin C  500 mg Oral Daily   Continuous Infusions:   Principal Problem:   Congestive heart failure Active Problems:   THROMBOCYTOPENIA, CHRONIC   HYPERTENSION, ESSENTIAL NOS   CIRRHOSIS, ALCOHOLIC    ASCITES   Atrial fibrillation   Sinus bradycardia    Time spent: 35    Armiyah Capron C  [facility] Pager [phone]. If 7PM-7AM, please contact night-coverage at [email or web address], password [facility] 05/26/2012, 11:26 AM  LOS: 1 day

## 2012-05-27 LAB — GLUCOSE, CAPILLARY
Glucose-Capillary: 183 mg/dL — ABNORMAL HIGH (ref 70–99)
Glucose-Capillary: 210 mg/dL — ABNORMAL HIGH (ref 70–99)

## 2012-05-27 MED ORDER — NITROGLYCERIN 0.4 MG SL SUBL: 0.4000 mg | SUBLINGUAL_TABLET | SUBLINGUAL | Status: DC | PRN

## 2012-05-27 MED ORDER — FUROSEMIDE 40 MG PO TABS: 40.0000 mg | ORAL_TABLET | Freq: Two times a day (BID) | ORAL | Status: DC

## 2012-05-27 NOTE — Consult Note (Signed)
WOC consult Note Reason for Consult:Bilateral Unna's Boots.  Patient is to be discharged today immediately following placement. Wound type:No wounds at this time.  Patient is followed by outpatient wound care center at [facility].  Patient has venous insufficiency. Dressing procedure/placement/frequency:Patient will follow up with outpatient wound care center. I will not follow.  Please re-consult if needed. Thanks, Ladona Mow, MSN, RN, [facility], CWOCN [phone])

## 2012-05-27 NOTE — Discharge Summary (Signed)
Physician Discharge Summary  [name] JYN:[id number] DOB: [birth year] DOA: 05/25/2012  PCP: Marga Melnick, MD  Admit date: 05/25/2012 Discharge date: 05/27/2012    Recommendations for Outpatient Follow-up:      Follow-up Information   Follow up with Marga Melnick, MD. (in 1-2weeks, call for appt upon discharge)    Contact information:   [phone] [address] [facility] [address] [name] [address] [phone]       Follow up with Willa Rough, MD. (next week, call for appt upon discharge)    Contact information:   [address] [place] [address] [phone]        Discharge Diagnoses:  Principal Problem:   Congestive heart failure Active Problems:   THROMBOCYTOPENIA, CHRONIC   HYPERTENSION, ESSENTIAL NOS   CIRRHOSIS, ALCOHOLIC    ASCITES   Atrial fibrillation   Sinus bradycardia   Discharge Condition: improved/stable  Diet recommendation: low Na heart healthy  Filed Weights   05/25/12 1640 05/26/12 1139 05/27/12 0526  Weight: 126 kg (277 lb 12.5 oz) 122.8 kg (270 lb 11.6 oz) 121.655 kg (268 lb 3.2 oz)    History of present illness:  [name] is a 77 y.o. male with multiple medical problems, diastolic heart failure, A fib, Diabetes, CAD, cirrhosis of liver who was transfer from PCP office for further evaluation of SOB. Patient presented to see his PCP because of worsening SOB over last couple of days. He describe orthopnea. He is also complaining of productive cough, that started 3 days prior to admission. He denies fever, chills. He has also notice increase abdomen distension over last 3 weeks. He relates abdominal pressure. He denies nausea, vomiting, abdominal pain. He has also notice increase weight. Patient received a dose of lasix and nitroglycerin paste in the ED with improvement of symptoms   Hospital Course:  Diastolic Heart Failure exacerbation: Patient presented with dyspnea, increase weight, orthopnea, chest x ray with  pulmonary edema; mildly increase BNP. Will admit to telemetry, cycle cardiac enzymes.  -ECHO showed EF of 60-65%, cardiac enzymes-neg. CT angio neg for PE  -no significant change in Cr overnight, will continue current IV lasix  -duresing well,in the hospital and improved clinically -He is to follow up outpt, his lasix dose was increased as per med rec. 2-History of cirrhosis: Patient with ascitis, fluid retention.  -Abd [place] was done and showed no ascites, he is to continue his outpt meds and follow up oupt  3-Chronic Thrombocytopenia: Stable.  4-Chronic Bilateral LE wound: Patient followed by Dr Ninetta Lights, with ID. He takes chronic penicillin. He started to follow with wound clinic. He has bilateral dressing LE.He is to follow up oupt  5-Increase D dimer: CT angio negative for PE.  -doppler LE neg.  6-Diabetes:  -Continue with lantus 20 units.  -follow accuchecks and Adjust insulin as needed.  7-History of A fib. EKG sinus. metoprolol and diltiazem on hold due to bradycardia. HR at 40 on admission.  -HR improved metoprolol was resumed and pt directed to hold off cardizem and follow up with Cards and PCP outpt     Procedures:  Echo Study Conclusions  - Left ventricle: The cavity size was mildly dilated. Wall thickness was normal. Systolic function was normal. The estimated ejection fraction was in the range of 60% to 65%. Wall motion was normal; there were no regional wall motion abnormalities. - Left atrium: The atrium was mildly to moderately dilated. - Right atrium: The atrium was mildly to moderately  dilated. - Pulmonary arteries: Systolic pressure was moderately increased. PA peak pressure: 58mm Hg (S).  LE doppler US  - neg for DVT  Consultations:  NONE  Discharge Exam: Filed Vitals:   05/26/12 2219 05/27/12 0526 05/27/12 1003 05/27/12 1136  BP:  118/48    Pulse:  65    Temp:  99 F (37.2 C)    TempSrc:  Axillary    Resp:  18    Height:      Weight:  121.655 kg  (268 lb 3.2 oz)    SpO2: 98% 97% 95% 96%    Exam:  General: elderly male, alert and oriented x3 in NAD  Cardiovascular: RRR,+systolic murmur  Respiratory: crackles at bases  Abdomen: soft +BS NT/ND  Extremities: decreasedR>L edema   Discharge Instructions  Discharge Orders   Future Appointments Provider Department Dept Phone   06/27/2012 9:45 AM Ginnie Smart, MD [facility] for Infectious Disease [phone]   07/05/2012 8:30 AM Romero Belling, MD [facility] PRIMARY CARE ENDOCRINOLOGY [phone]   Future Orders Complete By Expires     Diet Carb Modified  As directed     Increase activity slowly  As directed         Medication List    STOP taking these medications       diltiazem 180 MG 24 hr capsule  Commonly known as:  CARTIA XT      TAKE these medications       allopurinol 300 MG tablet  Commonly known as:  ZYLOPRIM  Take 150 mg by mouth daily.     CALCIUM/MAGNESIUM/ZINC FORMULA 1000-500-50 MG Tabs  Generic drug:  Calcium-Magnesium-Zinc  Take 1 tablet by mouth daily.     fentaNYL 50 MCG/HR  Commonly known as:  DURAGESIC - dosed mcg/hr  Place 1 patch onto the skin every 3 (three) days.     ferrous sulfate 325 (65 FE) MG tablet  Take 325 mg by mouth 2 (two) times daily.     furosemide 40 MG tablet  Commonly known as:  LASIX  Take 1 tablet (40 mg total) by mouth 2 (two) times daily.     gabapentin 300 MG capsule  Commonly known as:  NEURONTIN  Take 300 mg by mouth 3 (three) times daily.     glucose blood test strip  Commonly known as:  FREESTYLE LITE  1 each by Other route 2 (two) times daily. And lancets 250.01     insulin aspart 100 UNIT/ML injection  Commonly known as:  novoLOG  Inject 30-60 Units into the skin 3 (three) times daily before meals. 30 units with breakfast, 30 units with lunch and 60 units with the evening meal and take 5 extra units with any blood sugar over 200     insulin glargine 100 UNIT/ML injection  Commonly known  as:  LANTUS  Inject 15 Units into the skin at bedtime.     Insulin Syringe-Needle U-100 31G X 5/16" 1 ML Misc  Commonly known as:  RELION INSULIN SYRINGE 1ML/31G  Use as directed four time daily dx 250.73     isosorbide mononitrate 60 MG 24 hr tablet  Commonly known as:  IMDUR  Take 1 tablet (60 mg total) by mouth daily.     lactulose 10 GM/15ML solution  Commonly known as:  CHRONULAC  Take 20 g by mouth 2 (two) times daily.     loratadine 10 MG tablet  Commonly known as:  CLARITIN  Take 10 mg by mouth  daily.     metoprolol tartrate 25 MG tablet  Commonly known as:  LOPRESSOR  Take 12.5 mg by mouth 2 (two) times daily.     omeprazole 20 MG capsule  Commonly known as:  PRILOSEC  Take 20 mg by mouth daily.     penicillin v potassium 250 MG tablet  Commonly known as:  VEETID  Take 1 tablet (250 mg total) by mouth daily.     predniSONE 5 MG tablet  Commonly known as:  DELTASONE  Take 5 mg by mouth daily.     rifaximin 550 MG Tabs  Commonly known as:  XIFAXAN  Take 1 tablet (550 mg total) by mouth 2 (two) times daily.     spironolactone 50 MG tablet  Commonly known as:  ALDACTONE  Take 50 mg by mouth daily.     vitamin B-1 250 MG tablet  Take 125 mg by mouth daily.     vitamin C 500 MG tablet  Commonly known as:  ASCORBIC ACID  Take 500 mg by mouth daily.           Follow-up Information   Follow up with Marga Melnick, MD. (in 1-2weeks, call for appt upon discharge)    Contact information:   [phone] [facility] [facility] [address] [place] [address] [phone]       Follow up with Willa Rough, MD. (next week, call for appt upon discharge)    Contact information:   [address] [place] [address] [phone]        The results of significant diagnostics from this hospitalization (including imaging, microbiology, ancillary and laboratory) are listed below for reference.    Significant Diagnostic Studies: Dg Chest 2  View  05/25/2012  *RADIOLOGY REPORT*  Clinical Data: Shortness of breath.  CHEST - 2 VIEW  Comparison: 06/22/2011  Findings: There is evidence of interstitial edema/congestive heart failure with no significant pleural effusions.  Heart is enlarged. No focal pulmonary consolidation.  The patient has had prior CABG.  IMPRESSION: Evidence of interstitial edema/CHF.   Original Report Authenticated By: Irish Lack, M.D.    Ct Angio Chest W/cm &/or Wo Cm  05/25/2012  *RADIOLOGY REPORT*  Clinical Data: Dyspnea and chest tightness.  CT ANGIOGRAPHY CHEST  Technique:  Multidetector CT imaging of the chest using the standard protocol during bolus administration of intravenous contrast. Multiplanar reconstructed images including MIPs were obtained and reviewed to evaluate the vascular anatomy.  Contrast: OMNIPAQUE IOHEXOL 350 MG/ML SOLN  Comparison: 06/22/2011  Findings: There is no evidence of pulmonary embolism.  Lungs show venous hypertensive changes and mild interstitial edema with small bilateral pleural effusions, right greater than left.  No focal pulmonary consolidation.  The heart size is within normal limits.  No pericardial fluid is seen.  No nodules or enlarged lymph nodes.  IMPRESSION: Interstitial edema with small bilateral pleural effusions.  No evidence of pulmonary embolism.   Original Report Authenticated By: Irish Lack, M.D.    US Abdomen Complete  05/26/2012  *RADIOLOGY REPORT*  Clinical Data:  Abdominal distention and cirrhosis.  History of prior TIPS procedure.  ABDOMEN ULTRASOUND  Technique:  Complete abdominal ultrasound examination was performed including evaluation of the liver, gallbladder, bile ducts, pancreas, kidneys, spleen, IVC, and abdominal aorta.  Comparison:  Most recent TIPS Doppler study dated 07/26/2011.  Findings:  Gallbladder:  There are some small calculi in the gallbladder.  No evidence of gallbladder wall thickening or sonographic Murphy's sign.  Common  Bile Duct:   Normal caliber of 4 mm.  Liver:  Heterogeneous liver echotexture is consistent with known cirrhosis.  No focal masses are identified by ultrasound. The TIPS shunt was not formally evaluated.  Flow is seen into the shunt at the proximal/portal venous end.  IVC:  Patent throughout its visualized course in the abdomen.  Pancreas:  The pancreas is not visualized by ultrasound.  Spleen:  The spleen is enlarged with estimated volume of 1027 ml.  Kidneys:  The right kidney measures 11.7 cm and the left kidney 12.1 cm.  Both show no evidence of hydronephrosis or focal lesion.  Abdominal Aorta:  The abdominal aorta is completely non visible by ultrasound.  No ascites is identified.  IMPRESSION:  1.  Cholelithiasis with small gallstones identified.  No associated signs of cholecystitis or biliary obstruction. 2. Cirrhosis and splenomegaly.  No ascites identified.   Original Report Authenticated By: Irish Lack, M.D.     Microbiology: No results found for this or any previous visit (from the past 240 hour(s)).   Labs: Basic Metabolic Panel:  Recent Labs Lab 05/24/12 0915 05/25/12 1149 05/25/12 2146 05/26/12 0428  NA 136 141  --  137  K 4.2 3.9  --  4.4  CL 102 106  --  101  CO2 27 26  --  26  GLUCOSE 178* 90  --  316*  BUN 25* 27*  --  36*  CREATININE 1.4 1.40* 1.52* 1.49*  CALCIUM 8.4 8.9  --  8.8   Liver Function Tests:  Recent Labs Lab 05/26/12 0428  AST 24  ALT 12  ALKPHOS 63  BILITOT 0.7  PROT 6.3  ALBUMIN 3.3*   No results found for this basename: LIPASE, AMYLASE,  in the last 168 hours No results found for this basename: AMMONIA,  in the last 168 hours CBC:  Recent Labs Lab 05/25/12 1149 05/25/12 2146 05/26/12 0428  WBC 4.8 4.0 3.2*  HGB 10.6* 11.3* 10.4*  HCT 31.1* 33.7* 30.7*  MCV 94.2 94.4 92.7  PLT 61* 55* 52*   Cardiac Enzymes:  Recent Labs Lab 05/25/12 2146 05/26/12 0428 05/26/12 1001  TROPONINI <0.30 <0.30 <0.30   BNP: BNP (last 3 results)  Recent  Labs  06/22/11 2130 03/02/12 0938 05/25/12 1149  PROBNP 302.5* 395.0* 521.2*   CBG:  Recent Labs Lab 05/25/12 2313 05/26/12 0612 05/26/12 1111  GLUCAP 372* 282* 288*       Signed:  Analysse Quinonez C  [facility] 05/27/2012, 12:12 PM

## 2012-05-27 NOTE — Progress Notes (Signed)
Orthopedic Tech Progress Note Patient Details:  [name] 1935/02/25 [id number]  Ortho Devices Type of Ortho Device: Radio broadcast assistant Ortho Device/Splint Location: bilateral unna boots Ortho Device/Splint Interventions: Application   Cammer, Mickie Bail 05/27/2012, 1:06 PM

## 2012-05-28 ENCOUNTER — Telehealth: Payer: Self-pay | Admitting: Internal Medicine

## 2012-05-28 ENCOUNTER — Telehealth: Payer: Self-pay | Admitting: *Deleted

## 2012-05-28 NOTE — Telephone Encounter (Signed)
Patient Information:  Caller Name: [name]  Phone: [phone]  Patient: [name], [name]  Gender: Male  DOB: 07-10-1935  Age: 77 Years  PCP: Marga Melnick  Office Follow Up:  Does the office need to follow up with this patient?: Yes  Instructions For The Office: Wife would like feedback from Dr. Alwyn Ren as to whether or not it is recommended that patient be seen sooner than scheduled follow up.  Please see nurses notes for details.  RN Note:  Patient went to office on 05/25/12 with cough and congestion and had wheezing when lying down.  Patient was sent to the Emergency room and was diagnosed with Congestive heart failure. Wife states that patient is much improved from when see in the office on Friday however is still having Sinus congestion of yellow mucus and coughing up yellow phlegm with brownish tint.  This was present on discharge from the hospital and has not worsened since being sent home. Offerred to look at appointment and wife requested that a message be sent to Dr. Alwyn Ren to see if he felt it was necessary or if this was normal with patients medical history.  Symptoms  Reason For Call & Symptoms: Chest and sinus congestion  Reviewed Health History In EMR: Yes  Reviewed Medications In EMR: Yes  Reviewed Allergies In EMR: Yes  Reviewed Surgeries / Procedures: Yes  Date of Onset of Symptoms: 05/25/2012  Guideline(s) Used:  Sinus Pain and Congestion  Disposition Per Guideline:   See Today or Tomorrow in Office  Reason For Disposition Reached:   Sinus congestion (pressure, fullness) present > 10 days  Advice Given:  For a Stuffy Nose - Use Nasal Washes:  Step-By-Step Instructions:   Step 1: Lean over a sink.  Step 2: Gently squirt or spray warm salt water into one of your nostrils.  Step 3: Some of the water may run into the back of your throat. Spit this out. If you swallow the salt water it will not hurt you.  Step 4: Blow your nose to clean out the water and mucus.  Step  5: Repeat steps 1-4 for the other nostril. You can do this a couple times a day if it seems to help you.  Call Back If:   You become worse.  Patient Refused Recommendation:  Patient Refused Appt, Patient Requests Appt At Later Date  Wife would like feedback from Dr. Alwyn Ren

## 2012-05-28 NOTE — Telephone Encounter (Signed)
[name], the patient wife wanted Dr. Leone Payor to know that he saw Dr. Alwyn Ren on Friday the 4th .  Dr. Alwyn Ren sent him to the ER.  He was in CHF.  He did come home on Sunday, 4-6.  He was discharged with Lasix 80 mg daily.  Chales Abrahams CMA had left them a message from the labs he had drawn.  I left her know his kidney function , " is hanging in there" per Dr. Leone Payor.  She wanted Dr. Leone Payor to know about his hospital stay.

## 2012-05-28 NOTE — Telephone Encounter (Signed)
Attempted callback with patient and received voicemail.  Message left to call office back.

## 2012-05-29 ENCOUNTER — Encounter: Payer: Self-pay | Admitting: Internal Medicine

## 2012-05-31 ENCOUNTER — Encounter: Payer: Self-pay | Admitting: Cardiology

## 2012-05-31 ENCOUNTER — Ambulatory Visit (INDEPENDENT_AMBULATORY_CARE_PROVIDER_SITE_OTHER): Payer: Medicare Other | Admitting: Cardiology

## 2012-05-31 VITALS — BP 132/58 | HR 58 | Ht 72.0 in | Wt 261.0 lb

## 2012-05-31 DIAGNOSIS — I872 Venous insufficiency (chronic) (peripheral): Secondary | ICD-10-CM

## 2012-05-31 DIAGNOSIS — R0989 Other specified symptoms and signs involving the circulatory and respiratory systems: Secondary | ICD-10-CM

## 2012-05-31 DIAGNOSIS — I5030 Unspecified diastolic (congestive) heart failure: Secondary | ICD-10-CM

## 2012-05-31 DIAGNOSIS — I5032 Chronic diastolic (congestive) heart failure: Secondary | ICD-10-CM

## 2012-05-31 DIAGNOSIS — I498 Other specified cardiac arrhythmias: Secondary | ICD-10-CM

## 2012-05-31 DIAGNOSIS — R9430 Abnormal result of cardiovascular function study, unspecified: Secondary | ICD-10-CM

## 2012-05-31 DIAGNOSIS — I509 Heart failure, unspecified: Secondary | ICD-10-CM

## 2012-05-31 DIAGNOSIS — R001 Bradycardia, unspecified: Secondary | ICD-10-CM

## 2012-05-31 NOTE — Assessment & Plan Note (Signed)
The patient's legs are being wrapped weekly. This is helping.

## 2012-05-31 NOTE — Assessment & Plan Note (Signed)
The patient was bradycardic in the hospital and his meds to been reduced. His rate today is in the 60s. No change in medicine today.

## 2012-05-31 NOTE — Assessment & Plan Note (Signed)
The patient has had another episode of diastolic CHF. He was diaries in the hospital. He is to be continued on a higher dose of diuretics. We are arranging for his labs to be checked again next Monday on the day that he sees Dr. Alwyn Ren

## 2012-05-31 NOTE — Progress Notes (Signed)
HPI  The patient was recently admitted to the hospital with diastolic CHF. He also was bradycardic. Diltiazem was stopped. He was kept on a very small dose of metoprolol. Diuretic dosing was increased. He has been diaries seeing since being at home. He came home only a few days ago.  Allergies  Allergen Reactions  . Amoxicillin-Pot Clavulanate   . Aspirin     REACTION: unable to tolerate due to cirrhosis and varices  . Buprenorphine Hcl-Naloxone Hcl Other (See Comments)    unresponsive  . Codeine   . Ezetimibe     REACTION: LEG/JOINT PAIN  . Hydromorphone Hcl   . Meperidine Hcl   . Morphine   . Morphine Sulfate     REACTION: unspecified  . Oxycodone     hallucinations  . Shellfish-Derived Products     rash  . Sulfonamide Derivatives     Current Outpatient Prescriptions  Medication Sig Dispense Refill  . allopurinol (ZYLOPRIM) 300 MG tablet Take 150 mg by mouth daily.      . Ascorbic Acid (VITAMIN C) 500 MG tablet Take 500 mg by mouth daily.        . Calcium-Magnesium-Zinc (CALCIUM/MAGNESIUM/ZINC FORMULA) 1000-500-50 MG TABS Take 1 tablet by mouth daily.        . fentaNYL (DURAGESIC - DOSED MCG/HR) 50 MCG/HR Place 1 patch onto the skin every 3 (three) days.      . ferrous sulfate 325 (65 FE) MG tablet Take 325 mg by mouth 2 (two) times daily.        . furosemide (LASIX) 40 MG tablet Take 1 tablet (40 mg total) by mouth 2 (two) times daily.  135 tablet  3  . gabapentin (NEURONTIN) 300 MG capsule Take 300 mg by mouth 3 (three) times daily.      Marland Kitchen glucose blood (FREESTYLE LITE) test strip 1 each by Other route 2 (two) times daily. And lancets 250.01  100 each  5  . insulin aspart (NOVOLOG) 100 UNIT/ML injection Inject 30-60 Units into the skin 3 (three) times daily before meals. 30 units with breakfast, 30 units with lunch and 60 units with the evening meal and take 5 extra units with any blood sugar over 200      . insulin glargine (LANTUS) 100 UNIT/ML injection Inject 15 Units  into the skin at bedtime.       . Insulin Syringe-Needle U-100 (RELION INSULIN SYRINGE 1ML/31G) 31G X 5/16" 1 ML MISC Use as directed four time daily dx 250.73  200 each  4  . isosorbide mononitrate (IMDUR) 60 MG 24 hr tablet Take 1 tablet (60 mg total) by mouth daily.  90 tablet  3  . lactulose (CHRONULAC) 10 GM/15ML solution Take 20 g by mouth 2 (two) times daily.      Marland Kitchen loratadine (CLARITIN) 10 MG tablet Take 10 mg by mouth daily.      . metoprolol tartrate (LOPRESSOR) 25 MG tablet Take 12.5 mg by mouth 2 (two) times daily.      . nitroGLYCERIN (NITROSTAT) 0.4 MG SL tablet Place 1 tablet (0.4 mg total) under the tongue every 5 (five) minutes as needed for chest pain.  20 tablet  0  . omeprazole (PRILOSEC) 20 MG capsule Take 20 mg by mouth daily.        . penicillin v potassium (VEETID) 250 MG tablet Take 1 tablet (250 mg total) by mouth daily.  90 tablet  12  . predniSONE (DELTASONE) 5 MG tablet Take 5 mg  by mouth daily.        . rifaximin (XIFAXAN) 550 MG TABS Take 1 tablet (550 mg total) by mouth 2 (two) times daily.  180 tablet  3  . spironolactone (ALDACTONE) 50 MG tablet Take 50 mg by mouth daily.      . Thiamine HCl (VITAMIN B-1) 250 MG tablet Take 125 mg by mouth daily.        No current facility-administered medications for this visit.    History   Social History  . Marital Status: Married    Spouse Name: N/A    Number of Children: N/A  . Years of Education: N/A   Occupational History  .      retired   Social History Main Topics  . Smoking status: Former Smoker -- 2.00 packs/day for 20 years    Types: Cigarettes    Quit date: 02/22/1975  . Smokeless tobacco: Never Used  . Alcohol Use: No     Comment: none since Feb 2010  . Drug Use: No  . Sexually Active: Not on file   Other Topics Concern  . Not on file   Social History Narrative   Patient does not get regular exercise   Quit smoking over 40 years ago    Family History  Problem Relation Age of Onset  .  Alzheimer's disease Mother   . Cancer Father     GI (stomach?)  . Heart disease Sister     2 sisters  . Liver disease Sister   . Diabetes Brother   . Heart disease Brother     3 brothers  . Liver disease Brother   . Heart attack Other     sibling  . Melanoma Other     sibling  . Stroke Other     sibling    Past Medical History  Diagnosis Date  . Diverticulosis   . Gastric antral vascular ectasia   . Rhinophyma   . Melanoma     resected by [facility] 2008  . CAD (coronary artery disease)     myoview 2007 no ischemia/ no ASA because of GI disease  . Atrial fibrillation     amiodarone in past, but problems and left off meds.  . Aortic stenosis     echo, September, 2010, mild aortic insufficiency  . Visual loss, one eye     right eye-ophthalmic arterial branch occlusion  . Internal hemorrhoids   . GERD (gastroesophageal reflux disease)   . Osteoarthritis   . Diabetes mellitus, type 2   . Hypertension   . Allergic rhinitis   . Anemia     multifactorial  . Cirrhosis, alcoholic   . Thrombocytopenia   . Bleeding esophageal varices   . Encephalopathy, unspecified   . Gout   . OSA (obstructive sleep apnea)     CPAP  . Short-segment Barrett's esophagus     suspected  . Venous insufficiency   . Ejection fraction     EF 55%, echo, September, 2010  . Carotid bruit     Dopplers okay in the past  . Skin cancer     Basal cell and squamous cell  . Cellulitis   . CHF (congestive heart failure)   . Sinus bradycardia     January, 2014  . Diastolic CHF     January, 2014    Past Surgical History  Procedure Laterality Date  . Total knee arthroplasty      right knee  . Esophagogastroduodenoscopy  04/10/2008  esophageal varices, cardia varix  . Tips procedure  2/10  . Laminectomy      x 4  . Hemorrhoid surgery    . Release scar contracture / graft repairs of hand      bilaterally   . Arm surgery      right   . Hiatal hernia repair    . Colonoscopy w/ polypectomy   02/14/2007    adenomatous polyps, diverticulosis, internal hemorrhoids/rectal varices  . Cataract extraction, bilateral  January 2013  . Coronary artery bypass graft  1993    6 Bypass    Patient Active Problem List  Diagnosis  . HYPERLIPIDEMIA  . GOUT NOS  . ANEMIA, IRON DEFICIENCY  . THROMBOCYTOPENIA, CHRONIC  . ARTERIAL BRANCH OCCLUSION OF RETINA  . HYPERTENSION, ESSENTIAL NOS  . VENOUS INSUFFICIENCY, CHRONIC  . ALLERGIC RHINITIS  . GERD  . CIRRHOSIS, ALCOHOLIC  . Hepatic encephalopathy  . HYPERPLASIA, PRST NOS W/O URINARY OBST/LUTS  . OSTEOARTHRITIS  . LOW BACK PAIN, CHRONIC  . SLEEP APNEA  .  ASCITES  . HYPERURICEMIA  . COLONIC POLYPS, ADENOMATOUS, HX OF  . CORONARY ARTERY BYPASS GRAFT, HX OF  . NEUROPATHY  . FOOT ULCER  . Paresthesia of foot  . Ejection fraction  . Carotid bruit  . CAD (coronary artery disease)  . Atrial fibrillation  . Unspecified adverse effect of unspecified drug, medicinal and biological substance  . Type I (juvenile type) diabetes mellitus with peripheral circulatory disorders, uncontrolled  . Edema  . Sinus bradycardia  . Aortic stenosis  . Diastolic CHF  . Congestive heart failure    ROS   Patient denies fever, chills, headache, sweats, rash, change in vision, change in hearing, chest pain, cough, nausea vomiting, urinary symptoms. All other systems are reviewed and are negative.  PHYSICAL EXAM   Patient is here with his wife. He has a full beard. He is oriented to person time and place. Affect is normal. There is no jugulovenous distention. Lungs are clear. Respiratory effort is nonlabored. Cardiac exam reveals S1 and S2. There is a systolic murmur. The abdomen is soft. His legs are in the wraps.  Filed Vitals:   05/31/12 1606  BP: 132/58  Pulse: 58  Height: 6' (1.829 m)  Weight: 261 lb (118.389 kg)     ASSESSMENT & PLAN

## 2012-05-31 NOTE — Patient Instructions (Addendum)
Your physician wants you to follow-up in: 3 months.   You will receive a reminder letter in the mail two months in advance. If you don't receive a letter, please call our office to schedule the follow-up appointment.  Your physician recommends that you return for lab work in:  when you see Dr Alwyn Ren (bmet)

## 2012-05-31 NOTE — Assessment & Plan Note (Signed)
The patient's ejection fraction has remained normal.

## 2012-06-04 ENCOUNTER — Ambulatory Visit (INDEPENDENT_AMBULATORY_CARE_PROVIDER_SITE_OTHER): Payer: Medicare Other | Admitting: Internal Medicine

## 2012-06-04 ENCOUNTER — Other Ambulatory Visit: Payer: Medicare Other

## 2012-06-04 ENCOUNTER — Encounter: Payer: Self-pay | Admitting: Internal Medicine

## 2012-06-04 VITALS — BP 130/70 | HR 62 | Temp 97.6°F | Wt 266.0 lb

## 2012-06-04 DIAGNOSIS — R252 Cramp and spasm: Secondary | ICD-10-CM

## 2012-06-04 DIAGNOSIS — I5032 Chronic diastolic (congestive) heart failure: Secondary | ICD-10-CM

## 2012-06-04 LAB — BASIC METABOLIC PANEL
Chloride: 103 mEq/L (ref 96–112)
Creatinine, Ser: 1.2 mg/dL (ref 0.4–1.5)
GFR: 61.07 mL/min (ref >60.00)
Potassium: 4.2 mEq/L (ref 3.5–5.1)

## 2012-06-04 LAB — MAGNESIUM: Magnesium: 2.2 mg/dL (ref 1.5–2.5)

## 2012-06-04 NOTE — Progress Notes (Signed)
  Subjective:    Patient ID: [name], male    DOB: 02-Apr-1935, 77 y.o.   MRN: [id number]  HPI  Since discharge he has not experienced symptoms of heart failure. He is able to sleep in a reclining position; he denies paroxysmal nocturnal dyspnea  His furosemide was increased to 80 mg daily. His calcium channel blocker, diltiazem was discontinued.    Review of Systems He continues to have cramps in his hands, feet, inguinal area, and calves intermittently particularly at night. He has taken  mustard 2 teaspoons as needed for the symptoms.    Objective:   Physical Exam Appears adequately nourished & in no acute distress  Radiation of murmur into carotids  present.No neck pain distention present at 10 - 15 degrees.  Heart rhythm and rate are normal with Grade 2/6 systolic murmur.  Chest is clear with no increased work of breathing; there is no upper airway compromise when he reclines as was seen at the time of admission.  There is no evidence of aortic aneurysm or renal artery bruits  Abdomen soft with no organomegaly or masses. Large ventral hernia.No HJR  No clubbing, cyanosis or edema present.  Pedal pulses cannot be checked. Lower extremities are wrapped.  No ischemic skin changes are present .   Alert and oriented.           Assessment & Plan:  #1 congestive heart failure, compensated  #2 musculoskeletal cramps  Plan: See orders & recommendations

## 2012-06-04 NOTE — Patient Instructions (Addendum)
Avoid ingestion of  excess salt/sodium.Cook with pepper & other spices . Use the salt substitute Mrs Sharilyn Sites products to season food @ the table. Avoid foods which taste salty or "vinegary" (such as mustard) as their sodium contentet will be high.

## 2012-06-21 ENCOUNTER — Ambulatory Visit (INDEPENDENT_AMBULATORY_CARE_PROVIDER_SITE_OTHER): Payer: Medicare Other | Admitting: Internal Medicine

## 2012-06-21 ENCOUNTER — Emergency Department (HOSPITAL_COMMUNITY): Payer: Medicare Other

## 2012-06-21 ENCOUNTER — Encounter (HOSPITAL_COMMUNITY): Payer: Self-pay

## 2012-06-21 ENCOUNTER — Inpatient Hospital Stay (HOSPITAL_COMMUNITY)
Admission: EM | Admit: 2012-06-21 | Discharge: 2012-06-25 | DRG: 603 | Disposition: A | Discharge status: Home Health | Payer: Medicare Other | Attending: Internal Medicine | Admitting: Internal Medicine

## 2012-06-21 VITALS — BP 110/58 | HR 80 | Temp 102.0°F | Wt 263.0 lb

## 2012-06-21 DIAGNOSIS — J309 Allergic rhinitis, unspecified: Secondary | ICD-10-CM | POA: Diagnosis present

## 2012-06-21 DIAGNOSIS — M545 Low back pain, unspecified: Secondary | ICD-10-CM | POA: Diagnosis present

## 2012-06-21 DIAGNOSIS — R188 Other ascites: Secondary | ICD-10-CM | POA: Diagnosis present

## 2012-06-21 DIAGNOSIS — K7682 Hepatic encephalopathy: Secondary | ICD-10-CM

## 2012-06-21 DIAGNOSIS — Z951 Presence of aortocoronary bypass graft: Secondary | ICD-10-CM

## 2012-06-21 DIAGNOSIS — G4733 Obstructive sleep apnea (adult) (pediatric): Secondary | ICD-10-CM | POA: Diagnosis present

## 2012-06-21 DIAGNOSIS — Z880 Allergy status to penicillin: Secondary | ICD-10-CM

## 2012-06-21 DIAGNOSIS — I872 Venous insufficiency (chronic) (peripheral): Secondary | ICD-10-CM

## 2012-06-21 DIAGNOSIS — I10 Essential (primary) hypertension: Secondary | ICD-10-CM

## 2012-06-21 DIAGNOSIS — I359 Nonrheumatic aortic valve disorder, unspecified: Secondary | ICD-10-CM | POA: Diagnosis present

## 2012-06-21 DIAGNOSIS — G8929 Other chronic pain: Secondary | ICD-10-CM | POA: Diagnosis present

## 2012-06-21 DIAGNOSIS — E8809 Other disorders of plasma-protein metabolism, not elsewhere classified: Secondary | ICD-10-CM | POA: Diagnosis present

## 2012-06-21 DIAGNOSIS — H5460 Unqualified visual loss, one eye, unspecified: Secondary | ICD-10-CM | POA: Diagnosis present

## 2012-06-21 DIAGNOSIS — Z885 Allergy status to narcotic agent status: Secondary | ICD-10-CM

## 2012-06-21 DIAGNOSIS — L03115 Cellulitis of right lower limb: Secondary | ICD-10-CM

## 2012-06-21 DIAGNOSIS — I4891 Unspecified atrial fibrillation: Secondary | ICD-10-CM

## 2012-06-21 DIAGNOSIS — L0291 Cutaneous abscess, unspecified: Secondary | ICD-10-CM

## 2012-06-21 DIAGNOSIS — Z886 Allergy status to analgesic agent status: Secondary | ICD-10-CM

## 2012-06-21 DIAGNOSIS — IMO0002 Reserved for concepts with insufficient information to code with codable children: Secondary | ICD-10-CM

## 2012-06-21 DIAGNOSIS — E119 Type 2 diabetes mellitus without complications: Secondary | ICD-10-CM | POA: Diagnosis present

## 2012-06-21 DIAGNOSIS — L0390 Cellulitis, unspecified: Secondary | ICD-10-CM

## 2012-06-21 DIAGNOSIS — K219 Gastro-esophageal reflux disease without esophagitis: Secondary | ICD-10-CM | POA: Diagnosis present

## 2012-06-21 DIAGNOSIS — Z91013 Allergy to seafood: Secondary | ICD-10-CM

## 2012-06-21 DIAGNOSIS — M1990 Unspecified osteoarthritis, unspecified site: Secondary | ICD-10-CM | POA: Diagnosis present

## 2012-06-21 DIAGNOSIS — I798 Other disorders of arteries, arterioles and capillaries in diseases classified elsewhere: Secondary | ICD-10-CM | POA: Diagnosis present

## 2012-06-21 DIAGNOSIS — Z882 Allergy status to sulfonamides status: Secondary | ICD-10-CM

## 2012-06-21 DIAGNOSIS — K7030 Alcoholic cirrhosis of liver without ascites: Secondary | ICD-10-CM

## 2012-06-21 DIAGNOSIS — D696 Thrombocytopenia, unspecified: Secondary | ICD-10-CM

## 2012-06-21 DIAGNOSIS — E1059 Type 1 diabetes mellitus with other circulatory complications: Secondary | ICD-10-CM

## 2012-06-21 DIAGNOSIS — Z79899 Other long term (current) drug therapy: Secondary | ICD-10-CM

## 2012-06-21 DIAGNOSIS — Z87891 Personal history of nicotine dependence: Secondary | ICD-10-CM

## 2012-06-21 DIAGNOSIS — R4182 Altered mental status, unspecified: Secondary | ICD-10-CM

## 2012-06-21 DIAGNOSIS — Z823 Family history of stroke: Secondary | ICD-10-CM

## 2012-06-21 DIAGNOSIS — L03119 Cellulitis of unspecified part of limb: Principal | ICD-10-CM | POA: Diagnosis present

## 2012-06-21 DIAGNOSIS — L02419 Cutaneous abscess of limb, unspecified: Principal | ICD-10-CM | POA: Diagnosis present

## 2012-06-21 DIAGNOSIS — Z8249 Family history of ischemic heart disease and other diseases of the circulatory system: Secondary | ICD-10-CM

## 2012-06-21 DIAGNOSIS — R509 Fever, unspecified: Secondary | ICD-10-CM

## 2012-06-21 DIAGNOSIS — Z888 Allergy status to other drugs, medicaments and biological substances status: Secondary | ICD-10-CM

## 2012-06-21 DIAGNOSIS — Z96659 Presence of unspecified artificial knee joint: Secondary | ICD-10-CM

## 2012-06-21 DIAGNOSIS — Z881 Allergy status to other antibiotic agents status: Secondary | ICD-10-CM

## 2012-06-21 DIAGNOSIS — I2510 Atherosclerotic heart disease of native coronary artery without angina pectoris: Secondary | ICD-10-CM

## 2012-06-21 DIAGNOSIS — I350 Nonrheumatic aortic (valve) stenosis: Secondary | ICD-10-CM

## 2012-06-21 DIAGNOSIS — Z833 Family history of diabetes mellitus: Secondary | ICD-10-CM

## 2012-06-21 DIAGNOSIS — K7290 Hepatic failure, unspecified without coma: Secondary | ICD-10-CM

## 2012-06-21 DIAGNOSIS — Z794 Long term (current) use of insulin: Secondary | ICD-10-CM

## 2012-06-21 HISTORY — DX: Low back pain: M54.5

## 2012-06-21 HISTORY — DX: Personal history of other diseases of the digestive system: Z87.19

## 2012-06-21 HISTORY — DX: Adverse effect of unspecified anesthetic, initial encounter: T41.45XA

## 2012-06-21 HISTORY — DX: Shortness of breath: R06.02

## 2012-06-21 HISTORY — DX: Other complications of anesthesia, initial encounter: T88.59XA

## 2012-06-21 HISTORY — DX: Dependence on other enabling machines and devices: Z99.89

## 2012-06-21 HISTORY — DX: Cardiac murmur, unspecified: R01.1

## 2012-06-21 HISTORY — DX: Low back pain, unspecified: M54.50

## 2012-06-21 HISTORY — DX: Obstructive sleep apnea (adult) (pediatric): G47.33

## 2012-06-21 HISTORY — DX: Personal history of other medical treatment: Z92.89

## 2012-06-21 HISTORY — DX: Other chronic pain: G89.29

## 2012-06-21 HISTORY — DX: Angina pectoris, unspecified: I20.9

## 2012-06-21 LAB — URINALYSIS, ROUTINE W REFLEX MICROSCOPIC
Leukocytes, UA: NEGATIVE
Nitrite: NEGATIVE
Specific Gravity, Urine: 1.022 (ref 1.005–1.030)
Urobilinogen, UA: 1 mg/dL (ref 0.0–1.0)
pH: 6 (ref 5.0–8.0)

## 2012-06-21 LAB — COMPREHENSIVE METABOLIC PANEL
AST: 24 U/L (ref 0–37)
BUN: 21 mg/dL (ref 6–23)
CO2: 25 mEq/L (ref 19–32)
Calcium: 9.6 mg/dL (ref 8.4–10.5)
Chloride: 107 mEq/L (ref 96–112)
Creatinine, Ser: 1.13 mg/dL (ref 0.50–1.35)
GFR calc Af Amer: 72 mL/min — ABNORMAL LOW (ref >90)
GFR calc non Af Amer: 62 mL/min — ABNORMAL LOW (ref >90)
Glucose, Bld: 157 mg/dL — ABNORMAL HIGH (ref 70–99)
Total Bilirubin: 2.2 mg/dL — ABNORMAL HIGH (ref 0.3–1.2)

## 2012-06-21 LAB — CBC WITH DIFFERENTIAL/PLATELET
Basophils Absolute: 0 10*3/uL (ref 0.0–0.1)
Basophils Relative: 0 % (ref 0–1)
HCT: 35.6 % — ABNORMAL LOW (ref 39.0–52.0)
Hemoglobin: 12.3 g/dL — ABNORMAL LOW (ref 13.0–17.0)
Lymphocytes Relative: 7 % — ABNORMAL LOW (ref 12–46)
MCHC: 34.6 g/dL (ref 30.0–36.0)
Monocytes Absolute: 0.4 10*3/uL (ref 0.1–1.0)
Neutro Abs: 6.3 10*3/uL (ref 1.7–7.7)
Neutrophils Relative %: 88 % — ABNORMAL HIGH (ref 43–77)
RDW: 16 % — ABNORMAL HIGH (ref 11.5–15.5)
WBC: 7.2 10*3/uL (ref 4.0–10.5)

## 2012-06-21 LAB — GLUCOSE, CAPILLARY: Glucose-Capillary: 336 mg/dL — ABNORMAL HIGH (ref 70–99)

## 2012-06-21 MED ORDER — LORATADINE 10 MG PO TABS
10.0000 mg | ORAL_TABLET | Freq: Every day | ORAL | Status: DC
  Administered 2012-06-21 – 2012-06-25 (×5): 10 mg via ORAL
  Filled 2012-06-21 (×5): qty 1

## 2012-06-21 MED ORDER — RIFAXIMIN 550 MG PO TABS
550.0000 mg | ORAL_TABLET | Freq: Two times a day (BID) | ORAL | Status: DC
  Administered 2012-06-21 – 2012-06-25 (×8): 550 mg via ORAL
  Filled 2012-06-21 (×9): qty 1

## 2012-06-21 MED ORDER — VITAMIN B-1 50 MG PO TABS
125.0000 mg | ORAL_TABLET | Freq: Every day | ORAL | Status: DC
  Administered 2012-06-21 – 2012-06-24 (×4): 125 mg via ORAL
  Filled 2012-06-21 (×5): qty 1

## 2012-06-21 MED ORDER — NITROGLYCERIN 0.4 MG SL SUBL: 0.4000 mg | SUBLINGUAL_TABLET | SUBLINGUAL | Status: DC | PRN

## 2012-06-21 MED ORDER — VANCOMYCIN HCL IN DEXTROSE 1-5 GM/200ML-% IV SOLN
1000.0000 mg | Freq: Two times a day (BID) | INTRAVENOUS | Status: DC
  Administered 2012-06-22 – 2012-06-25 (×7): 1000 mg via INTRAVENOUS
  Filled 2012-06-21 (×8): qty 200

## 2012-06-21 MED ORDER — TRAMADOL HCL 50 MG PO TABS
50.0000 mg | ORAL_TABLET | Freq: Four times a day (QID) | ORAL | Status: DC | PRN
  Administered 2012-06-21 – 2012-06-24 (×7): 50 mg via ORAL
  Filled 2012-06-21 (×8): qty 1

## 2012-06-21 MED ORDER — PIPERACILLIN-TAZOBACTAM 3.375 G IVPB
3.3750 g | Freq: Three times a day (TID) | INTRAVENOUS | Status: DC
  Administered 2012-06-21 – 2012-06-25 (×11): 3.375 g via INTRAVENOUS
  Filled 2012-06-21 (×13): qty 50

## 2012-06-21 MED ORDER — FERROUS SULFATE 325 (65 FE) MG PO TABS
325.0000 mg | ORAL_TABLET | Freq: Two times a day (BID) | ORAL | Status: DC
  Administered 2012-06-21 – 2012-06-25 (×8): 325 mg via ORAL
  Filled 2012-06-21 (×9): qty 1

## 2012-06-21 MED ORDER — METOPROLOL TARTRATE 12.5 MG HALF TABLET
12.5000 mg | ORAL_TABLET | Freq: Two times a day (BID) | ORAL | Status: DC
  Administered 2012-06-22 – 2012-06-25 (×7): 12.5 mg via ORAL
  Filled 2012-06-21 (×8): qty 1

## 2012-06-21 MED ORDER — SPIRONOLACTONE 50 MG PO TABS
50.0000 mg | ORAL_TABLET | Freq: Every day | ORAL | Status: DC
  Administered 2012-06-21 – 2012-06-25 (×5): 50 mg via ORAL
  Filled 2012-06-21 (×5): qty 1

## 2012-06-21 MED ORDER — PANTOPRAZOLE SODIUM 40 MG PO TBEC
40.0000 mg | DELAYED_RELEASE_TABLET | Freq: Every day | ORAL | Status: DC
  Administered 2012-06-21 – 2012-06-25 (×5): 40 mg via ORAL
  Filled 2012-06-21 (×5): qty 1

## 2012-06-21 MED ORDER — FENTANYL CITRATE 0.05 MG/ML IJ SOLN: 12.5000 ug | INTRAMUSCULAR | Status: DC | PRN

## 2012-06-21 MED ORDER — SODIUM CHLORIDE 0.9 % IJ SOLN: 3.0000 mL | INTRAMUSCULAR | Status: DC | PRN

## 2012-06-21 MED ORDER — SODIUM CHLORIDE 0.9 % IJ SOLN
3.0000 mL | Freq: Two times a day (BID) | INTRAMUSCULAR | Status: DC
  Administered 2012-06-22 – 2012-06-24 (×4): 3 mL via INTRAVENOUS

## 2012-06-21 MED ORDER — FENTANYL 50 MCG/HR TD PT72
50.0000 ug | MEDICATED_PATCH | TRANSDERMAL | Status: DC
  Administered 2012-06-24: 50 ug via TRANSDERMAL
  Filled 2012-06-21: qty 1

## 2012-06-21 MED ORDER — VANCOMYCIN HCL IN DEXTROSE 1-5 GM/200ML-% IV SOLN
1000.0000 mg | INTRAVENOUS | Status: AC
  Administered 2012-06-21: 1000 mg via INTRAVENOUS
  Filled 2012-06-21: qty 200

## 2012-06-21 MED ORDER — INSULIN GLARGINE 100 UNIT/ML ~~LOC~~ SOLN
15.0000 [IU] | Freq: Every day | SUBCUTANEOUS | Status: DC
  Administered 2012-06-21 – 2012-06-24 (×4): 15 [IU] via SUBCUTANEOUS
  Filled 2012-06-21 (×5): qty 0.15

## 2012-06-21 MED ORDER — VANCOMYCIN HCL 10 G IV SOLR: 1.0000 g | Freq: Once | INTRAVENOUS | Status: DC

## 2012-06-21 MED ORDER — SODIUM CHLORIDE 0.9 % IV SOLN
Freq: Once | INTRAVENOUS | Status: AC
  Administered 2012-06-21: 14:00:00 via INTRAVENOUS

## 2012-06-21 MED ORDER — SODIUM CHLORIDE 0.9 % IV SOLN: 250.0000 mL | INTRAVENOUS | Status: DC | PRN

## 2012-06-21 MED ORDER — PNEUMOCOCCAL VAC POLYVALENT 25 MCG/0.5ML IJ INJ
0.5000 mL | INJECTION | INTRAMUSCULAR | Status: AC
  Administered 2012-06-22: 0.5 mL via INTRAMUSCULAR
  Filled 2012-06-21: qty 0.5

## 2012-06-21 MED ORDER — SODIUM CHLORIDE 0.9 % IV SOLN
INTRAVENOUS | Status: AC
  Administered 2012-06-21: 19:00:00 via INTRAVENOUS

## 2012-06-21 MED ORDER — PIPERACILLIN-TAZOBACTAM 3.375 G IVPB
3.3750 g | Freq: Once | INTRAVENOUS | Status: AC
  Administered 2012-06-21: 3.375 g via INTRAVENOUS
  Filled 2012-06-21: qty 50

## 2012-06-21 MED ORDER — FUROSEMIDE 40 MG PO TABS
40.0000 mg | ORAL_TABLET | Freq: Two times a day (BID) | ORAL | Status: DC
  Administered 2012-06-22 – 2012-06-25 (×7): 40 mg via ORAL
  Filled 2012-06-21 (×10): qty 1

## 2012-06-21 MED ORDER — VITAMIN C 500 MG PO TABS
500.0000 mg | ORAL_TABLET | Freq: Every day | ORAL | Status: DC
  Administered 2012-06-21 – 2012-06-25 (×5): 500 mg via ORAL
  Filled 2012-06-21 (×5): qty 1

## 2012-06-21 MED ORDER — ONDANSETRON HCL 4 MG/2ML IJ SOLN: 4.0000 mg | Freq: Four times a day (QID) | INTRAMUSCULAR | Status: DC | PRN

## 2012-06-21 MED ORDER — VANCOMYCIN HCL IN DEXTROSE 1-5 GM/200ML-% IV SOLN
1000.0000 mg | Freq: Once | INTRAVENOUS | Status: AC
  Administered 2012-06-21: 1000 mg via INTRAVENOUS
  Filled 2012-06-21: qty 200

## 2012-06-21 MED ORDER — VITAMIN B-1 100 MG PO TABS: 125.0000 mg | ORAL_TABLET | Freq: Every day | ORAL | Status: DC

## 2012-06-21 MED ORDER — INSULIN ASPART 100 UNIT/ML ~~LOC~~ SOLN
0.0000 [IU] | Freq: Three times a day (TID) | SUBCUTANEOUS | Status: DC
  Administered 2012-06-21: 7 [IU] via SUBCUTANEOUS
  Administered 2012-06-22: 11 [IU] via SUBCUTANEOUS
  Administered 2012-06-22: 4 [IU] via SUBCUTANEOUS
  Administered 2012-06-22 – 2012-06-23 (×4): 11 [IU] via SUBCUTANEOUS
  Administered 2012-06-24: 7 [IU] via SUBCUTANEOUS
  Administered 2012-06-24: 11 [IU] via SUBCUTANEOUS
  Administered 2012-06-24: 15 [IU] via SUBCUTANEOUS
  Administered 2012-06-25: 7 [IU] via SUBCUTANEOUS

## 2012-06-21 MED ORDER — SENNA 8.6 MG PO TABS
1.0000 | ORAL_TABLET | Freq: Every day | ORAL | Status: DC | PRN
  Filled 2012-06-21: qty 1

## 2012-06-21 MED ORDER — GABAPENTIN 300 MG PO CAPS
300.0000 mg | ORAL_CAPSULE | Freq: Two times a day (BID) | ORAL | Status: DC
  Administered 2012-06-21 – 2012-06-25 (×8): 300 mg via ORAL
  Filled 2012-06-21 (×9): qty 1

## 2012-06-21 MED ORDER — PREDNISONE 5 MG PO TABS
5.0000 mg | ORAL_TABLET | Freq: Every day | ORAL | Status: DC
  Administered 2012-06-21 – 2012-06-25 (×5): 5 mg via ORAL
  Filled 2012-06-21 (×5): qty 1

## 2012-06-21 MED ORDER — ALLOPURINOL 150 MG HALF TABLET
150.0000 mg | ORAL_TABLET | Freq: Every day | ORAL | Status: DC
  Administered 2012-06-21 – 2012-06-25 (×5): 150 mg via ORAL
  Filled 2012-06-21 (×6): qty 1

## 2012-06-21 MED ORDER — ONDANSETRON HCL 4 MG PO TABS: 4.0000 mg | ORAL_TABLET | Freq: Four times a day (QID) | ORAL | Status: DC | PRN

## 2012-06-21 MED ORDER — LACTULOSE 10 GM/15ML PO SOLN
20.0000 g | Freq: Two times a day (BID) | ORAL | Status: DC
  Administered 2012-06-21 – 2012-06-25 (×7): 20 g via ORAL
  Filled 2012-06-21 (×9): qty 30

## 2012-06-21 MED ORDER — ISOSORBIDE MONONITRATE ER 60 MG PO TB24
60.0000 mg | ORAL_TABLET | Freq: Every day | ORAL | Status: DC
  Administered 2012-06-21 – 2012-06-25 (×5): 60 mg via ORAL
  Filled 2012-06-21 (×5): qty 1

## 2012-06-21 NOTE — ED Notes (Signed)
Report called to Diane, RN; pt to be transported to inpt unit after having RLE US performed; pt and family member aware of same as well as inpt RN receiving pt

## 2012-06-21 NOTE — ED Notes (Signed)
Pt presents from Dr. Frederik Pear office with onset of fever.

## 2012-06-21 NOTE — Patient Instructions (Addendum)
Share results with all  medical staff seen

## 2012-06-21 NOTE — Progress Notes (Signed)
  Subjective:    Patient ID: [name], male    DOB: [birth year], 77 y.o.   MRN: [id number]  HPI   This morning abruptly @ 7:30 he developed right groin chills. He went back to bed and slept until approximately 9. At that time he had a bowel movement was which was normal without diarrhea, melena, rectal bleeding.  His wife states that he was oriented but his temperature was almost 102. His blood pressure was 150/45.  The right groin chills have persisted and he has become less alert.  He has had some difficulty emptying his bladder completely for the last 3 weeks says he's been on 80 mg of Lasix.  For 2 days he's had diffuse aching.  Review of Systems  There are no definite localizing symptoms. He has chronic erythema of the lower extremities but no pustular lesions have been noted  He has no frontal headache, facial pain, nasal purulence, sore throat, cough, sputum production, dysuria, pyuria, or hematuria.  The right lower extremity edema has increased after the compression wraps were removed.  FBS 124 this am     Objective:   Physical Exam  He appears acutely ill with marked decrease in mental status. He's had an involuntary facial tics on the right. Some asterixis is present in the hands.  Eyes: No conjunctival inflammation or scleral icterus is present.  Neck reveals decreased range of motion. No meningismus suggested  Oral exam: Dental hygiene is good; lips and gums are healthy appearing.There is minimal oropharyngeal erythema or exudate noted.   Heart:  Normal rate and slightly irregular rhythm. S1 and S2 normal without gallop, click, rub or other extra sounds . Flow murmur present   Lungs: Rales left lower lobe; decreased breath sounds right base.No increased work of breathing.   Abdomen: bowel sounds normal, soft and non-tender without masses, organomegaly or hernias noted.  No guarding or rebound   Skin:Hot & dry.  Intact without suspicious lesions or rashes ;  no jaundice or tenting. No clubbing. There is chronic bruising over the arms. Erythema of the shins right greater than left. Ulcers left shin without evidence of purulence.  Lymphatic: No lymphadenopathy is noted about the head, neck, axilla.             Assessment & Plan:   #1 high fever without localizing signs or symptoms. He has had some difficulty with voiding completely. He has a past history of cellulitis of the lower extremities; clinically that does not appear to be the site of infection. Urosepsis is very likely. The abnormal breath sounds could indicate community-acquired pneumonia.  Plan: Emergency transport for direct admission. Contact will be made with the emergency room.

## 2012-06-21 NOTE — Progress Notes (Signed)
TIMO HARTWIG [id number] Admission Data: 06/21/2012 6:57 PM Attending Provider: Penny Pia, MD  [id number] Alwyn Ren, MD Consults/ Treatment Team:    [name] is a 77 y.o. male patient admitted from ED with BLE celulitis awake, alert  & orientated  X 3,  Full Code, VSS - Blood pressure 130/67, pulse 68, temperature 98.3 F (36.8 C), temperature source Oral, resp. rate 20, height 6\' 4"  (1.93 m), weight 119.296 kg (263 lb), SpO2 95.00%.,, no c/o shortness of breath, no c/o chest pain, no distress noted.   IV site WDL:  wrist left, condition patent and no redness with a transparent dsg that's clean dry and intact.  Allergies:   Allergies  Allergen Reactions  . Shellfish-Derived Products     Rash & angioedema  . Amoxicillin-Pot Clavulanate     Unsure what reaction was but pt tolerated penicillin 05/25/2012  . Aspirin     REACTION: unable to tolerate due to cirrhosis and varices  . Buprenorphine Hcl-Naloxone Hcl Other (See Comments)    unresponsive  . Codeine     Rash    . Ezetimibe     REACTION: LEG/JOINT PAIN  . Hydromorphone Hcl     unknown  . Meperidine Hcl     unknown  . Morphine     unknown  . Morphine Sulfate     REACTION: unspecified  . Oxycodone     hallucinations  . Sulfonamide Derivatives      Past Medical History  Diagnosis Date  . Diverticulosis   . Gastric antral vascular ectasia   . Rhinophyma   . Melanoma     resected by [facility] 2008  . CAD (coronary artery disease)     myoview 2007 no ischemia/ no ASA because of GI disease  . Atrial fibrillation     amiodarone in past, but problems and left off meds.  . Aortic stenosis     echo, September, 2010, mild aortic insufficiency  . Visual loss, one eye     right eye-ophthalmic arterial branch occlusion  . Internal hemorrhoids   . GERD (gastroesophageal reflux disease)   . Osteoarthritis   . Diabetes mellitus, type 2   . Hypertension   . Allergic rhinitis   . Anemia     multifactorial  . Cirrhosis,  alcoholic   . Thrombocytopenia   . Bleeding esophageal varices   . Encephalopathy, unspecified   . Gout   . OSA (obstructive sleep apnea)     CPAP  . Short-segment Barrett's esophagus     suspected  . Venous insufficiency   . Ejection fraction     EF 55%, echo, September, 2010  . Carotid bruit     Dopplers okay in the past  . Skin cancer     Basal cell and squamous cell  . Cellulitis   . CHF (congestive heart failure)   . Sinus bradycardia     January, 2014  . Diastolic CHF     January, 2014    History:  obtained from the patient.   Pt orientation to unit, room and routine. Information packet given to patient/family .  Admission INP armband ID verified with patient/family, and in place. SR up x 2, fall risk assessment complete with Patient and family verbalizing understanding of risks associated with falls. Pt verbalizes an understanding of how to use the call bell and to call for help before getting out of bed.  BLE are red and endemic, right greater than left. Bilaterl UE have bruising and  fragile skin.   .     Will cont to monitor and assist as needed.  Cindra Eves, RN 06/21/2012 6:57 PM

## 2012-06-21 NOTE — Progress Notes (Signed)
91ANTIBIOTIC CONSULT NOTE - INITIAL  Pharmacy Consult for Zosyn and Vancomycin Indication: RLE cellulitis  Allergies  Allergen Reactions  . Shellfish-Derived Products     Rash & angioedema  . Amoxicillin-Pot Clavulanate     Unsure what reaction was but pt tolerated penicillin 05/25/2012  . Aspirin     REACTION: unable to tolerate due to cirrhosis and varices  . Buprenorphine Hcl-Naloxone Hcl Other (See Comments)    unresponsive  . Codeine     Rash    . Ezetimibe     REACTION: LEG/JOINT PAIN  . Hydromorphone Hcl     unknown  . Meperidine Hcl     unknown  . Morphine     unknown  . Morphine Sulfate     REACTION: unspecified  . Oxycodone     hallucinations  . Sulfonamide Derivatives     Patient Measurements: Height: 6' (182.9 cm) Weight: 263 lb (119.296 kg) IBW/kg (Calculated) : 77.6 Adjusted Body Weight: 91 kg  Vital Signs: Temp: 99.7 F (37.6 C) (05/01 1621) Temp src: Oral (05/01 1621) BP: 115/51 mmHg (05/01 1621) Pulse Rate: 71 (05/01 1621) Intake/Output from previous day:   Intake/Output from this shift:    Labs:  Recent Labs  06/21/12 1400  WBC 7.2  HGB 12.3*  PLT 50*  CREATININE 1.13   Estimated Creatinine Clearance: 76.5 ml/min (by C-G formula based on Cr of 1.13). No results found for this basename: VANCOTROUGH, VANCOPEAK, VANCORANDOM, GENTTROUGH, GENTPEAK, GENTRANDOM, TOBRATROUGH, TOBRAPEAK, TOBRARND, AMIKACINPEAK, AMIKACINTROU, AMIKACIN,  in the last 72 hours   Microbiology: No results found for this or any previous visit (from the past 720 hour(s)).  Medical History: Past Medical History  Diagnosis Date  . Diverticulosis   . Gastric antral vascular ectasia   . Rhinophyma   . Melanoma     resected by [facility] 2008  . CAD (coronary artery disease)     myoview 2007 no ischemia/ no ASA because of GI disease  . Atrial fibrillation     amiodarone in past, but problems and left off meds.  . Aortic stenosis     echo, September, 2010, mild  aortic insufficiency  . Visual loss, one eye     right eye-ophthalmic arterial branch occlusion  . Internal hemorrhoids   . GERD (gastroesophageal reflux disease)   . Osteoarthritis   . Diabetes mellitus, type 2   . Hypertension   . Allergic rhinitis   . Anemia     multifactorial  . Cirrhosis, alcoholic   . Thrombocytopenia   . Bleeding esophageal varices   . Encephalopathy, unspecified   . Gout   . OSA (obstructive sleep apnea)     CPAP  . Short-segment Barrett's esophagus     suspected  . Venous insufficiency   . Ejection fraction     EF 55%, echo, September, 2010  . Carotid bruit     Dopplers okay in the past  . Skin cancer     Basal cell and squamous cell  . Cellulitis   . CHF (congestive heart failure)   . Sinus bradycardia     January, 2014  . Diastolic CHF     January, 2014    Medications:  Prescriptions prior to admission  Medication Sig Dispense Refill  . allopurinol (ZYLOPRIM) 300 MG tablet Take 150 mg by mouth daily.      . Ascorbic Acid (VITAMIN C) 500 MG tablet Take 500 mg by mouth daily.        . Calcium-Magnesium-Zinc (CALCIUM/MAGNESIUM/ZINC FORMULA)  1000-500-50 MG TABS Take 1 tablet by mouth daily.        . fentaNYL (DURAGESIC - DOSED MCG/HR) 50 MCG/HR Place 1 patch onto the skin every 3 (three) days.      . ferrous sulfate 325 (65 FE) MG tablet Take 325 mg by mouth 2 (two) times daily.        . furosemide (LASIX) 40 MG tablet Take 1 tablet (40 mg total) by mouth 2 (two) times daily.  135 tablet  3  . gabapentin (NEURONTIN) 300 MG capsule Take 300 mg by mouth 2 (two) times daily.       . insulin aspart (NOVOLOG) 100 UNIT/ML injection Inject 30-60 Units into the skin 3 (three) times daily before meals. 30 units with breakfast, 30 units with lunch and 60 units with the evening meal and take 5 extra units with any blood sugar over 200      . insulin glargine (LANTUS) 100 UNIT/ML injection Inject 15 Units into the skin at bedtime.       . isosorbide  mononitrate (IMDUR) 60 MG 24 hr tablet Take 1 tablet (60 mg total) by mouth daily.  90 tablet  3  . lactulose (CHRONULAC) 10 GM/15ML solution Take 20 g by mouth 2 (two) times daily.      Marland Kitchen loratadine (CLARITIN) 10 MG tablet Take 10 mg by mouth daily.      . metoprolol tartrate (LOPRESSOR) 25 MG tablet Take 12.5 mg by mouth 2 (two) times daily.      . nitroGLYCERIN (NITROSTAT) 0.4 MG SL tablet Place 1 tablet (0.4 mg total) under the tongue every 5 (five) minutes as needed for chest pain.  20 tablet  0  . omeprazole (PRILOSEC) 20 MG capsule Take 20 mg by mouth daily.        . penicillin v potassium (VEETID) 250 MG tablet Take 1 tablet (250 mg total) by mouth daily.  90 tablet  12  . polyethylene glycol (MIRALAX / GLYCOLAX) packet Take 17 g by mouth daily as needed. Take two or three times a week      . predniSONE (DELTASONE) 5 MG tablet Take 5 mg by mouth daily.        . rifaximin (XIFAXAN) 550 MG TABS Take 1 tablet (550 mg total) by mouth 2 (two) times daily.  180 tablet  3  . spironolactone (ALDACTONE) 50 MG tablet Take 50 mg by mouth daily.      . Thiamine HCl (VITAMIN B-1) 250 MG tablet Take 125 mg by mouth daily.       Marland Kitchen glucose blood (FREESTYLE LITE) test strip 1 each by Other route 2 (two) times daily. And lancets 250.01  100 each  5   Assessment: 77 y.o. male presents with RLE cellulitis, fever, chills, and rigors. Noted pt with history of recurrent RLE cellulitis. To continue broad spectrum antibiotics. Pt received Zosyn 3.375gm IV at 1516 and Vancomycin 1gm IV at 1635. (pt with unknown allergy to Augmentin but tolerated penicillin earlier this month and tolerated Zosyn in ED today). Pt appears to have normal renal function. Cx pending.  Goal of Therapy:  Vancomycin trough level 10-15 mcg/ml  Plan:  1. Zosyn 3.375gm IV q8h. Each dose over 4 hours. 2. Vancomycin 1 gm IV now in addition to 1 gm given earlier to equal 2 gm load then 1 gm IV q12h. 3. Will f/u microbiological data, renal  function, and trough at C[facility]  Christoper Fabian, PharmD, BCPS Clinical pharmacist, pager [phone]  06/21/2012,6:04 PM

## 2012-06-21 NOTE — ED Provider Notes (Signed)
Pt with hx of CABG - saphenous grafts harvested from the R and LLE but has had recurrent problems with cellulitis in the RLE.  He presents today from the office of Dr. Alwyn Ren with a fever, has had some difficulty with urination but specifically has had right lower extremity redness and swelling. His wife states that this morning he woke up and was slightly confused, this has resolved but he has felt shaky all day. On my exam the patient has a swollen right leg with erythema and hot to the touch up to the knee, soft abdomen with no focal tenderness, lungs are clear, no rales, no wheezing, heart is regular without tachycardia, mucous members are mildly dehydrated. His mental status seems appropriate though he does have a little bit of asterixis. With his history of cirrhosis he will require liver function tests workup, ammonia, thankfully white blood cell counts are normal and he is not tachycardic. Due to his significant cellulitis and possible urinary infection given his history of difficulty urinating he will need admission to the hospital for antibiotic therapy.  Medical screening examination/treatment/procedure(s) were conducted as a shared visit with non-physician practitioner(s) and myself.  I personally evaluated the patient during the encounter    Vida Roller, MD 06/21/12 1506

## 2012-06-21 NOTE — H&P (Addendum)
[facility] History and Physical  Absalom R Nicolaou BJY:[id number] DOB: 1935/09/06 DOA: 06/21/2012  Referring physician: Dr. Hyacinth Meeker PCP: Marga Melnick, MD  Specialists: none  Chief Complaint: Pain at right lower leg  HPI: [name] is a 77 y.o. male  With pertinent history of CAD s/p CABG with saphenous vein harvesting from both legs but reportedly more on the right and history of recurrent cellulitis to RLE.  Presented today to the ED after developing fever, chills, and rigors.  Patient also endorses RLE discomfort.  His RLE is more edematous and erythematous than his LLE.  Nothing he is aware of makes it better but ambulating makes his pain worse.  He denies any chest pain or shortness of breath.  The problem was first noticed today and it has persisted.  While in the ED patient was found to have fever with an erythematous RLE which was painful to the touch.  We were consulted for admission evaluation and recommendations regarding his RLE cellulitis.  Review of Systems: 10 point review of system reviewed and negative unless otherwise mentioned above.  Past Medical History  Diagnosis Date  . Diverticulosis   . Gastric antral vascular ectasia   . Rhinophyma   . Melanoma     resected by [facility] 2008  . CAD (coronary artery disease)     myoview 2007 no ischemia/ no ASA because of GI disease  . Atrial fibrillation     amiodarone in past, but problems and left off meds.  . Aortic stenosis     echo, September, 2010, mild aortic insufficiency  . Visual loss, one eye     right eye-ophthalmic arterial branch occlusion  . Internal hemorrhoids   . GERD (gastroesophageal reflux disease)   . Osteoarthritis   . Diabetes mellitus, type 2   . Hypertension   . Allergic rhinitis   . Anemia     multifactorial  . Cirrhosis, alcoholic   . Thrombocytopenia   . Bleeding esophageal varices   . Encephalopathy, unspecified   . Gout   . OSA (obstructive sleep apnea)     CPAP  . Short-segment  Barrett's esophagus     suspected  . Venous insufficiency   . Ejection fraction     EF 55%, echo, September, 2010  . Carotid bruit     Dopplers okay in the past  . Skin cancer     Basal cell and squamous cell  . Cellulitis   . CHF (congestive heart failure)   . Sinus bradycardia     January, 2014  . Diastolic CHF     January, 2014   Past Surgical History  Procedure Laterality Date  . Total knee arthroplasty      right knee  . Esophagogastroduodenoscopy  04/10/2008    esophageal varices, cardia varix  . Tips procedure  2/10  . Laminectomy      x 4  . Hemorrhoid surgery    . Release scar contracture / graft repairs of hand      bilaterally   . Arm surgery      right   . Hiatal hernia repair    . Colonoscopy w/ polypectomy  02/14/2007    adenomatous polyps, diverticulosis, internal hemorrhoids/rectal varices  . Cataract extraction, bilateral  January 2013  . Coronary artery bypass graft  1993    6 Bypass   Social History:  reports that he quit smoking about 37 years ago. His smoking use included Cigarettes. He has a 40 pack-year smoking history.  He has never used smokeless tobacco. He reports that he does not drink alcohol or use illicit drugs.  where does patient live--home, ALF, SNF? and with whom if at home? Lives at home with wife  Can patient participate in ADLs? yes  Allergies  Allergen Reactions  . Shellfish-Derived Products     Rash & angioedema  . Amoxicillin-Pot Clavulanate   . Aspirin     REACTION: unable to tolerate due to cirrhosis and varices  . Buprenorphine Hcl-Naloxone Hcl Other (See Comments)    unresponsive  . Codeine     Rash    . Ezetimibe     REACTION: LEG/JOINT PAIN  . Hydromorphone Hcl     unknown  . Meperidine Hcl     unknown  . Morphine     unknown  . Morphine Sulfate     REACTION: unspecified  . Oxycodone     hallucinations  . Sulfonamide Derivatives     Family History  Problem Relation Age of Onset  . Alzheimer's disease  Mother   . Cancer Father     GI (stomach?)  . Heart disease Sister     2 sisters  . Liver disease Sister   . Diabetes Brother   . Heart disease Brother     3 brothers  . Liver disease Brother   . Heart attack Other     sibling  . Melanoma Other     sibling  . Stroke Other     sibling  none other reported  Prior to Admission medications   Medication Sig Start Date End Date Taking? Authorizing Provider  allopurinol (ZYLOPRIM) 300 MG tablet Take 150 mg by mouth daily.   Yes Historical Provider, MD  Ascorbic Acid (VITAMIN C) 500 MG tablet Take 500 mg by mouth daily.     Yes Historical Provider, MD  Calcium-Magnesium-Zinc (CALCIUM/MAGNESIUM/ZINC FORMULA) 1000-500-50 MG TABS Take 1 tablet by mouth daily.     Yes Historical Provider, MD  fentaNYL (DURAGESIC - DOSED MCG/HR) 50 MCG/HR Place 1 patch onto the skin every 3 (three) days.   Yes Historical Provider, MD  ferrous sulfate 325 (65 FE) MG tablet Take 325 mg by mouth 2 (two) times daily.     Yes Historical Provider, MD  furosemide (LASIX) 40 MG tablet Take 1 tablet (40 mg total) by mouth 2 (two) times daily. 05/27/12  Yes Adeline C Viyuoh, MD  gabapentin (NEURONTIN) 300 MG capsule Take 300 mg by mouth 2 (two) times daily.    Yes Historical Provider, MD  insulin aspart (NOVOLOG) 100 UNIT/ML injection Inject 30-60 Units into the skin 3 (three) times daily before meals. 30 units with breakfast, 30 units with lunch and 60 units with the evening meal and take 5 extra units with any blood sugar over 200 08/01/11  Yes Romero Belling, MD  insulin glargine (LANTUS) 100 UNIT/ML injection Inject 15 Units into the skin at bedtime.  08/01/11  Yes Romero Belling, MD  isosorbide mononitrate (IMDUR) 60 MG 24 hr tablet Take 1 tablet (60 mg total) by mouth daily. 03/12/12  Yes Luis Abed, MD  lactulose (CHRONULAC) 10 GM/15ML solution Take 20 g by mouth 2 (two) times daily.   Yes Historical Provider, MD  loratadine (CLARITIN) 10 MG tablet Take 10 mg by mouth  daily.   Yes Historical Provider, MD  metoprolol tartrate (LOPRESSOR) 25 MG tablet Take 12.5 mg by mouth 2 (two) times daily.   Yes Historical Provider, MD  nitroGLYCERIN (NITROSTAT) 0.4 MG SL tablet  Place 1 tablet (0.4 mg total) under the tongue every 5 (five) minutes as needed for chest pain. 05/27/12  Yes Adeline Joselyn Glassman, MD  omeprazole (PRILOSEC) 20 MG capsule Take 20 mg by mouth daily.     Yes Historical Provider, MD  penicillin v potassium (VEETID) 250 MG tablet Take 1 tablet (250 mg total) by mouth daily. 05/14/12  Yes Ginnie Smart, MD  polyethylene glycol [facility] / GLYCOLAX) packet Take 17 g by mouth daily as needed. Take two or three times a week   Yes Historical Provider, MD  predniSONE (DELTASONE) 5 MG tablet Take 5 mg by mouth daily.     Yes Historical Provider, MD  rifaximin (XIFAXAN) 550 MG TABS Take 1 tablet (550 mg total) by mouth 2 (two) times daily. 03/02/12  Yes Iva Boop, MD  spironolactone (ALDACTONE) 50 MG tablet Take 50 mg by mouth daily. 04/30/12  Yes Iva Boop, MD  Thiamine HCl (VITAMIN B-1) 250 MG tablet Take 125 mg by mouth daily.    Yes Historical Provider, MD  glucose blood (FREESTYLE LITE) test strip 1 each by Other route 2 (two) times daily. And lancets 250.01 08/01/11   Romero Belling, MD   Physical Exam: Filed Vitals:   06/21/12 1323 06/21/12 1408 06/21/12 1517  BP: 129/47 117/49 129/52  Pulse: 74 66 67  Temp: 101.1 F (38.4 C)  99.3 F (37.4 C)  TempSrc: Oral  Oral  Resp: 24 22 24   SpO2: 99% 98% 97%     General:  Pt in NAD, Alert and awake  Eyes: EOMI, PERRLA  ENT: nomal exterior appearance, no masses on visual examination, MMM  Neck: supple, no goiter  Cardiovascular: RRR, No MRG  Respiratory: CTA BL, no increased work of breathing on room air  Abdomen: soft, NT, ND  Skin: RLE cellulitis which extends from dorsum of R foot to knee.   Musculoskeletal: no cyanosis no clubbing  Psychiatric: mood and affect appropriate  Neurologic:  answers questions appropriately, moves all extremities, no facial asymmetry  Labs on Admission:  Basic Metabolic Panel:  Recent Labs Lab 06/21/12 1400  NA 144  K 4.2  CL 107  CO2 25  GLUCOSE 157*  BUN 21  CREATININE 1.13  CALCIUM 9.6   Liver Function Tests:  Recent Labs Lab 06/21/12 1400  AST 24  ALT 12  ALKPHOS 61  BILITOT 2.2*  PROT 6.7  ALBUMIN 3.6   No results found for this basename: LIPASE, AMYLASE,  in the last 168 hours  Recent Labs Lab 06/21/12 1510  AMMONIA 35   CBC:  Recent Labs Lab 06/21/12 1400  WBC 7.2  NEUTROABS 6.3  HGB 12.3*  HCT 35.6*  MCV 93.4  PLT 50*   Cardiac Enzymes: No results found for this basename: CKTOTAL, CKMB, CKMBINDEX, TROPONINI,  in the last 168 hours  BNP (last 3 results)  Recent Labs  06/22/11 2130 03/02/12 0938 05/25/12 1149  PROBNP 302.5* 395.0* 521.2*   CBG: No results found for this basename: GLUCAP,  in the last 168 hours  Radiological Exams on Admission: Dg Chest 2 View  06/21/2012  *RADIOLOGY REPORT*  Clinical Data: Fever  CHEST - 2 VIEW  Comparison: May 25, 2012.  Findings: Stable mild cardiomegaly. Status post coronary artery bypass graft.  No pneumothorax is noted.  Bony thorax is intact. Mild chronic central pulmonary vascular congestion is noted which is unchanged.  No acute pulmonary disease is noted.  IMPRESSION: No acute cardiopulmonary abnormality seen.  Original Report Authenticated By: Lupita Raider.,  M.D.     EKG: Independently reviewed.   Assessment/Plan Active Problems:   RLE cellulitis Atrial fibrillation Cirrhosis alcoholic GERD Low back pain chronic Thrombocytopenia DMI   1. RLE cellulitis - Will cover with broad spectrum antibiotics - Pain control, although limited by allergies and history of AMS after opiod administration.  Will continue home regimen pain medication and escalate them carefully. - Monitor Vitals and WBC levels - Most likely related to poor circulation  given history of CABG with saphenous vein harvesting.  2. Atrial fibrillation - continue metoprolol - not on coumadin given thrombocytopenia most likely  3. Cirrhosis - stable will continue home regimen  4. Thrombocytopenia - most likely related to cirrhosis - will limit DVT prophylaxis  5. GERD - Patient reported some indigestion while in the ED. Will place on protonix and reassess next am.  6. Low back pain chronic - continue fentanyl at home dose.  May have to escalate it due to # 1  7. DM - SSI - Continue Lantus at home dose - Diabetic diet - monitor CBG's   Code Status: full  Family Communication: Discussed with patient and wife at bedside. Disposition Plan: Pending improvement in cellulitis.  Patient has had history of picc line placement for prolonged antibiotic administration.  Will consider placing picc line should cellulitis not improve relatively quickly.  Time spent: > 70 minutes  Penny Pia [facility] Pager [phone]  If 7PM-7AM, please contact night-coverage [email or web address] Password [facility] 06/21/2012, 4:12 PM    Addendum: Received a call from vascular stating that patient has had multiple vascular studies of his RLE which have been negative.  The most recent one done on May 26 2012.  As such will discontinue order for RLE venous dupplex.

## 2012-06-21 NOTE — ED Notes (Signed)
Spoke with [place] tech who reports Dr.Vega has d/c'd RLE doppler for now; Diane on 5500 made aware of pt's impending arrival

## 2012-06-21 NOTE — ED Notes (Signed)
Water given per Maryruth Bun, RN

## 2012-06-21 NOTE — ED Provider Notes (Signed)
History     CSN: [id number]  Arrival date & time 06/21/12  1322   First MD Initiated Contact with Patient 06/21/12 1323      No chief complaint on file.   (Consider location/radiation/quality/duration/timing/severity/associated sxs/prior treatment) Patient is a 77 y.o. male presenting with fever. The history is provided by the patient.  Fever Temp source:  Subjective Severity:  Moderate Timing:  Constant Associated symptoms: chills and confusion   Associated symptoms: no congestion, no cough, no nausea, no sore throat and no vomiting   Associated symptoms comment:  He woke with rigors this morning, generalized aches and a mild confusion. Per his wife, his confusion has resolved and he is currently at baseline. No nausea, cough, SOB. He reports mild suprapubic abdominal discomfort and difficulty starting a urinary stream and that this happens periodically. He states his right lower extremity is sore and is swollen to a larger degree than his baseline edema s/p vein harvesting for cardiac bypass from this leg.    Past Medical History  Diagnosis Date  . Diverticulosis   . Gastric antral vascular ectasia   . Rhinophyma   . Melanoma     resected by [facility] 2008  . CAD (coronary artery disease)     myoview 2007 no ischemia/ no ASA because of GI disease  . Atrial fibrillation     amiodarone in past, but problems and left off meds.  . Aortic stenosis     echo, September, 2010, mild aortic insufficiency  . Visual loss, one eye     right eye-ophthalmic arterial branch occlusion  . Internal hemorrhoids   . GERD (gastroesophageal reflux disease)   . Osteoarthritis   . Diabetes mellitus, type 2   . Hypertension   . Allergic rhinitis   . Anemia     multifactorial  . Cirrhosis, alcoholic   . Thrombocytopenia   . Bleeding esophageal varices   . Encephalopathy, unspecified   . Gout   . OSA (obstructive sleep apnea)     CPAP  . Short-segment Barrett's esophagus     suspected  .  Venous insufficiency   . Ejection fraction     EF 55%, echo, September, 2010  . Carotid bruit     Dopplers okay in the past  . Skin cancer     Basal cell and squamous cell  . Cellulitis   . CHF (congestive heart failure)   . Sinus bradycardia     January, 2014  . Diastolic CHF     January, 2014    Past Surgical History  Procedure Laterality Date  . Total knee arthroplasty      right knee  . Esophagogastroduodenoscopy  04/10/2008    esophageal varices, cardia varix  . Tips procedure  2/10  . Laminectomy      x 4  . Hemorrhoid surgery    . Release scar contracture / graft repairs of hand      bilaterally   . Arm surgery      right   . Hiatal hernia repair    . Colonoscopy w/ polypectomy  02/14/2007    adenomatous polyps, diverticulosis, internal hemorrhoids/rectal varices  . Cataract extraction, bilateral  January 2013  . Coronary artery bypass graft  1993    6 Bypass    Family History  Problem Relation Age of Onset  . Alzheimer's disease Mother   . Cancer Father     GI (stomach?)  . Heart disease Sister     2 sisters  .  Liver disease Sister   . Diabetes Brother   . Heart disease Brother     3 brothers  . Liver disease Brother   . Heart attack Other     sibling  . Melanoma Other     sibling  . Stroke Other     sibling    History  Substance Use Topics  . Smoking status: Former Smoker -- 2.00 packs/day for 20 years    Types: Cigarettes    Quit date: 02/22/1975  . Smokeless tobacco: Never Used  . Alcohol Use: No     Comment: none since Feb 2010      Review of Systems  Constitutional: Positive for fever and chills.  HENT: Negative.  Negative for congestion, sore throat, trouble swallowing, neck pain and neck stiffness.   Respiratory: Negative.  Negative for cough and shortness of breath.   Cardiovascular: Negative.   Gastrointestinal: Positive for abdominal pain. Negative for nausea and vomiting.  Genitourinary: Positive for difficulty urinating.   Musculoskeletal: Negative.   Skin: Negative.   Neurological: Positive for tremors and weakness.  Hematological: Bruises/bleeds easily.  Psychiatric/Behavioral: Positive for confusion.    Allergies  Shellfish-derived products; Amoxicillin-pot clavulanate; Aspirin; Buprenorphine hcl-naloxone hcl; Codeine; Ezetimibe; Hydromorphone hcl; Meperidine hcl; Morphine; Morphine sulfate; Oxycodone; and Sulfonamide derivatives  Home Medications   Current Outpatient Rx  Name  Route  Sig  Dispense  Refill  . allopurinol (ZYLOPRIM) 300 MG tablet   Oral   Take 150 mg by mouth daily.         . Ascorbic Acid (VITAMIN C) 500 MG tablet   Oral   Take 500 mg by mouth daily.           . Calcium-Magnesium-Zinc (CALCIUM/MAGNESIUM/ZINC FORMULA) 1000-500-50 MG TABS   Oral   Take 1 tablet by mouth daily.           . fentaNYL (DURAGESIC - DOSED MCG/HR) 50 MCG/HR   Transdermal   Place 1 patch onto the skin every 3 (three) days.         . ferrous sulfate 325 (65 FE) MG tablet   Oral   Take 325 mg by mouth 2 (two) times daily.           . furosemide (LASIX) 40 MG tablet   Oral   Take 1 tablet (40 mg total) by mouth 2 (two) times daily.   135 tablet   3   . gabapentin (NEURONTIN) 300 MG capsule   Oral   Take 300 mg by mouth 2 (two) times daily.          Marland Kitchen glucose blood (FREESTYLE LITE) test strip   Other   1 each by Other route 2 (two) times daily. And lancets 250.01   100 each   5   . insulin aspart (NOVOLOG) 100 UNIT/ML injection   Subcutaneous   Inject 30-60 Units into the skin 3 (three) times daily before meals. 30 units with breakfast, 30 units with lunch and 60 units with the evening meal and take 5 extra units with any blood sugar over 200         . insulin glargine (LANTUS) 100 UNIT/ML injection   Subcutaneous   Inject 15 Units into the skin at bedtime.          . Insulin Syringe-Needle U-100 (RELION INSULIN SYRINGE 1ML/31G) 31G X 5/16" 1 ML MISC      Use as  directed four time daily dx 250.73   200 each  4   . isosorbide mononitrate (IMDUR) 60 MG 24 hr tablet   Oral   Take 1 tablet (60 mg total) by mouth daily.   90 tablet   3     Need to call and schedule follow up office visit t ...   . lactulose (CHRONULAC) 10 GM/15ML solution   Oral   Take 20 g by mouth 2 (two) times daily.         Marland Kitchen loratadine (CLARITIN) 10 MG tablet   Oral   Take 10 mg by mouth daily.         . metoprolol tartrate (LOPRESSOR) 25 MG tablet   Oral   Take 12.5 mg by mouth 2 (two) times daily.         . nitroGLYCERIN (NITROSTAT) 0.4 MG SL tablet   Sublingual   Place 1 tablet (0.4 mg total) under the tongue every 5 (five) minutes as needed for chest pain.   20 tablet   0   . omeprazole (PRILOSEC) 20 MG capsule   Oral   Take 20 mg by mouth daily.           . penicillin v potassium (VEETID) 250 MG tablet   Oral   Take 1 tablet (250 mg total) by mouth daily.   90 tablet   12     [employer] ID# [id number]   . predniSONE (DELTASONE) 5 MG tablet   Oral   Take 5 mg by mouth daily.           . rifaximin (XIFAXAN) 550 MG TABS   Oral   Take 1 tablet (550 mg total) by mouth 2 (two) times daily.   180 tablet   3     Faxed signed Rx to [facility] at [phone]   . spironolactone (ALDACTONE) 50 MG tablet   Oral   Take 50 mg by mouth daily.         . Thiamine HCl (VITAMIN B-1) 250 MG tablet   Oral   Take 125 mg by mouth daily.            BP 117/49  Pulse 66  Temp(Src) 101.1 F (38.4 C) (Oral)  Resp 22  SpO2 98%  Physical Exam  Constitutional: He is oriented to person, place, and time. He appears well-developed and well-nourished. No distress.  HENT:  Head: Normocephalic.  Mouth/Throat: Uvula is midline. Mucous membranes are dry.  Eyes: Conjunctivae are normal.  Neck: Normal range of motion.  Cardiovascular: Normal rate.   Grade 1-2/6 systolic murmur.  Pulmonary/Chest: Effort normal. He has no wheezes. He has no rales. He  exhibits no tenderness.  Abdominal: Soft. There is no tenderness.  Central hernia present that is non-tender, easily reducible.   Musculoskeletal: He exhibits edema.  Neurological: He is alert and oriented to person, place, and time.  Skin: There is erythema.  Right lower leg markedly swollen, red, warm to touch. No lesions or drainage. Moderately tender circumferentially.   Psychiatric: He has a normal mood and affect.    ED Course  Procedures (including critical care time)  Labs Reviewed  CBC WITH DIFFERENTIAL - Abnormal; Notable for the following:    RBC 3.81 (*)    Hemoglobin 12.3 (*)    HCT 35.6 (*)    RDW 16.0 (*)    Neutrophils Relative 88 (*)    Lymphocytes Relative 7 (*)    Lymphs Abs 0.5 (*)    All other components within normal limits  COMPREHENSIVE METABOLIC PANEL -  Abnormal; Notable for the following:    Glucose, Bld 157 (*)    Total Bilirubin 2.2 (*)    GFR calc non Af Amer 62 (*)    GFR calc Af Amer 72 (*)    All other components within normal limits  URINE CULTURE  CULTURE, BLOOD (ROUTINE X 2)  CULTURE, BLOOD (ROUTINE X 2)  URINALYSIS, ROUTINE W REFLEX MICROSCOPIC  AMMONIA   Dg Chest 2 View  06/21/2012  *RADIOLOGY REPORT*  Clinical Data: Fever  CHEST - 2 VIEW  Comparison: May 25, 2012.  Findings: Stable mild cardiomegaly. Status post coronary artery bypass graft.  No pneumothorax is noted.  Bony thorax is intact. Mild chronic central pulmonary vascular congestion is noted which is unchanged.  No acute pulmonary disease is noted.  IMPRESSION: No acute cardiopulmonary abnormality seen.   Original Report Authenticated By: Lupita Raider.,  M.D.      No diagnosis found.   1. Cellulitis 2. Rigors 3. Febrile illness  MDM  Older patient with fever, rigors, evidence of cellulitis in LE with complicated medical history. Will admit for IV abx of cellulitis. UA, ammonia pending.        Arnoldo Hooker, PA-C 06/21/12 1529

## 2012-06-22 DIAGNOSIS — I872 Venous insufficiency (chronic) (peripheral): Secondary | ICD-10-CM

## 2012-06-22 LAB — URINE CULTURE

## 2012-06-22 LAB — GLUCOSE, CAPILLARY
Glucose-Capillary: 196 mg/dL — ABNORMAL HIGH (ref 70–99)
Glucose-Capillary: 267 mg/dL — ABNORMAL HIGH (ref 70–99)
Glucose-Capillary: 291 mg/dL — ABNORMAL HIGH (ref 70–99)

## 2012-06-22 LAB — CBC
HCT: 29.3 % — ABNORMAL LOW (ref 39.0–52.0)
MCH: 31.9 pg (ref 26.0–34.0)
MCV: 92.4 fL (ref 78.0–100.0)
Platelets: 37 10*3/uL — ABNORMAL LOW (ref 150–400)
RBC: 3.17 MIL/uL — ABNORMAL LOW (ref 4.22–5.81)
RDW: 15.7 % — ABNORMAL HIGH (ref 11.5–15.5)

## 2012-06-22 LAB — COMPREHENSIVE METABOLIC PANEL
AST: 20 U/L (ref 0–37)
Albumin: 2.8 g/dL — ABNORMAL LOW (ref 3.5–5.2)
Alkaline Phosphatase: 48 U/L (ref 39–117)
Chloride: 103 mEq/L (ref 96–112)
Potassium: 3.7 mEq/L (ref 3.5–5.1)
Sodium: 137 mEq/L (ref 135–145)
Total Bilirubin: 1.8 mg/dL — ABNORMAL HIGH (ref 0.3–1.2)
Total Protein: 5.5 g/dL — ABNORMAL LOW (ref 6.0–8.3)

## 2012-06-22 NOTE — Progress Notes (Signed)
5/2  Spoke with patient and wife about his diabetes.  Was diagnosed 12-15 years ago.  Was on oral medications to start.  Has been on insulin now for about 6-7 years.  Has seen Dr. Everardo All for the past 2-3 years for diabetes control. Takes Lantus 15 units every night, Novolog 30 units with breakfast, 30 units with lunch, and up to 60 units with dinner depending on what he eats.  Lantus was decreased from 30 units in November. Has low blood sugars occasionally. HgbA1C was 6.5% in January. Wife states that he checks blood sugars twice a day and she writes it down every day in record book.  [facility] on his legs, mainly the right leg.  Tries to keep legs elevated at home as much as possible.  Wife states that they have reached their large deductible on their insurance policy, so meds should cost less now until the end of the year.   Will continue to follow while in hospital.  Smith Mince RN BSN CDE

## 2012-06-22 NOTE — ED Provider Notes (Signed)
  Medical screening examination/treatment/procedure(s) were conducted as a shared visit with non-physician practitioner(s) and myself.  I personally evaluated the patient during the encounter  Please see my separate respective documentation pertaining to this patient encounter   Leightyn Cina D Dujuan Stankowski, MD 06/22/12 0800 

## 2012-06-22 NOTE — Progress Notes (Signed)
[facility] PROGRESS NOTE  [name] GNF:[id number] DOB: [birth year] DOA: 06/21/2012 PCP: Marga Melnick, MD  HPI/Subjective: Feels OK, ad fever last night, has no other complaints  Assessment/Plan:  RLE cellulitis - Will cover with broad spectrum antibiotics  - Pain control, although limited by allergies and history of AMS after opiod administration. Will continue home regimen pain medication and escalate them carefully.  - Monitor Vitals and WBC levels  - Most likely related to poor circulation given history of CABG with saphenous vein harvesting.  - Elevate lower extremities.  Atrial fibrillation  - continue metoprolol  - not on coumadin given thrombocytopenia most likely   Cirrhosis  - stable will continue home regimen   Thrombocytopenia  - most likely related to cirrhosis  - will limit DVT prophylaxis   GERD  - Patient reported some indigestion while in the ED. Will place on protonix and reassess next am.   Low back pain chronic  - continue fentanyl at home dose. May have to escalate it due to # 1   DM  - SSI  - Continue Lantus at home dose  - Diabetic diet  - monitor CBG's   Code Status: Full code Family Communication:  Disposition Plan: Remains inpatient   Consultants:  None  Procedures:  None  Antibiotics:  Zosyn and Vancomycin    Objective: Filed Vitals:   06/21/12 1810 06/21/12 2119 06/22/12 0604 06/22/12 1003  BP: 130/67 137/56 132/62 121/71  Pulse: 68 67 64 61  Temp: 98.3 F (36.8 C) 100.6 F (38.1 C) 97.9 F (36.6 C)   TempSrc: Oral Oral Oral   Resp: 20 24 20    Height:      Weight:      SpO2: 95% 93% 98%     Intake/Output Summary (Last 24 hours) at 06/22/12 1106 Last data filed at 06/22/12 0227  Gross per 24 hour  Intake    3.5 ml  Output    300 ml  Net -296.5 ml   Filed Weights   06/21/12 1700  Weight: 119.296 kg (263 lb)    Exam:  General: Alert and awake, oriented x3, not in any acute distress. HEENT:  anicteric sclera, pupils reactive to light and accommodation, EOMI CVS: S1-S2 clear, no murmur rubs or gallops Chest: clear to auscultation bilaterally, no wheezing, rales or rhonchi Abdomen: soft nontender, nondistended, normal bowel sounds, no organomegaly Extremities: no cyanosis, clubbing or edema noted bilaterally Neuro: Cranial nerves II-XII intact, no focal neurological deficits   Data Reviewed: Basic Metabolic Panel:  Recent Labs Lab 06/21/12 1400 06/21/12 1852 06/22/12 0520  NA 144  --  137  K 4.2  --  3.7  CL 107  --  103  CO2 25  --  25  GLUCOSE 157*  --  224*  BUN 21  --  24*  CREATININE 1.13  --  1.19  CALCIUM 9.6  --  8.6  MG  --  2.1  --   PHOS  --  2.7  --    Liver Function Tests:  Recent Labs Lab 06/21/12 1400 06/22/12 0520  AST 24 20  ALT 12 9  ALKPHOS 61 48  BILITOT 2.2* 1.8*  PROT 6.7 5.5*  ALBUMIN 3.6 2.8*   No results found for this basename: LIPASE, AMYLASE,  in the last 168 hours  Recent Labs Lab 06/21/12 1510  AMMONIA 35   CBC:  Recent Labs Lab 06/21/12 1400 06/22/12 0520  WBC 7.2 5.1  NEUTROABS 6.3  --  HGB 12.3* 10.1*  HCT 35.6* 29.3*  MCV 93.4 92.4  PLT 50* 37*   Cardiac Enzymes: No results found for this basename: CKTOTAL, CKMB, CKMBINDEX, TROPONINI,  in the last 168 hours BNP (last 3 results)  Recent Labs  03/02/12 0938 05/25/12 1149  PROBNP 395.0* 521.2*   CBG:  Recent Labs Lab 06/21/12 1824 06/21/12 2122 06/22/12 0811  GLUCAP 205* 336* 196*    Recent Results (from the past 240 hour(s))  CULTURE, BLOOD (ROUTINE X 2)     Status: None   Collection Time    06/21/12  1:50 PM      Result Value Range Status   Specimen Description BLOOD WRIST LEFT   Final   Special Requests BOTTLES DRAWN AEROBIC ONLY 7CC   Final   Culture  Setup Time 06/21/2012 17:27   Final   Culture     Final   Value:        BLOOD CULTURE RECEIVED NO GROWTH TO DATE CULTURE WILL BE HELD FOR 5 DAYS BEFORE ISSUING A FINAL NEGATIVE REPORT    Report Status PENDING   Incomplete  CULTURE, BLOOD (ROUTINE X 2)     Status: None   Collection Time    06/21/12  1:59 PM      Result Value Range Status   Specimen Description BLOOD HAND RIGHT   Final   Special Requests BOTTLES DRAWN AEROBIC ONLY 3CC   Final   Culture  Setup Time 06/21/2012 17:19   Final   Culture     Final   Value:        BLOOD CULTURE RECEIVED NO GROWTH TO DATE CULTURE WILL BE HELD FOR 5 DAYS BEFORE ISSUING A FINAL NEGATIVE REPORT   Report Status PENDING   Incomplete     Studies: Dg Chest 2 View  06/21/2012  *RADIOLOGY REPORT*  Clinical Data: Fever  CHEST - 2 VIEW  Comparison: May 25, 2012.  Findings: Stable mild cardiomegaly. Status post coronary artery bypass graft.  No pneumothorax is noted.  Bony thorax is intact. Mild chronic central pulmonary vascular congestion is noted which is unchanged.  No acute pulmonary disease is noted.  IMPRESSION: No acute cardiopulmonary abnormality seen.   Original Report Authenticated By: Lupita Raider.,  M.D.     Scheduled Meds: . allopurinol  150 mg Oral Daily  . [START ON 06/24/2012] fentaNYL  50 mcg Transdermal Q72H  . ferrous sulfate  325 mg Oral BID  . furosemide  40 mg Oral BID  . gabapentin  300 mg Oral BID  . insulin aspart  0-20 Units Subcutaneous TID WC  . insulin glargine  15 Units Subcutaneous QHS  . isosorbide mononitrate  60 mg Oral Daily  . lactulose  20 g Oral BID  . loratadine  10 mg Oral Daily  . metoprolol tartrate  12.5 mg Oral BID  . pantoprazole  40 mg Oral Daily  . piperacillin-tazobactam (ZOSYN)  IV  3.375 g Intravenous Q8H  . predniSONE  5 mg Oral Daily  . rifaximin  550 mg Oral BID  . sodium chloride  3 mL Intravenous Q12H  . spironolactone  50 mg Oral Daily  . thiamine  125 mg Oral Daily  . vancomycin  1,000 mg Intravenous Q12H  . vitamin C  500 mg Oral Daily   Continuous Infusions:   Active Problems:   THROMBOCYTOPENIA, CHRONIC   GERD   CIRRHOSIS, ALCOHOLIC   LOW BACK PAIN, CHRONIC     ASCITES   Atrial fibrillation  Type I (juvenile type) diabetes mellitus with peripheral circulatory disorders, uncontrolled(250.73)   Cellulitis of right lower extremity    Time spent: 35 miutues    [facility] A  [facility] Pager [phone] If 7PM-7AM, please contact night-coverage at [email or web address], password [facility] 06/22/2012, 11:06 AM  LOS: 1 day

## 2012-06-22 NOTE — Care Management Note (Unsigned)
    Page 1 of 1   06/25/2012     11:56:12 AM   CARE MANAGEMENT NOTE 06/25/2012  Patient:  [name], [name]   Account Number:  [id number]  Date Initiated:  06/22/2012  Documentation initiated by:  Letha Cape  Subjective/Objective Assessment:   dx  ascites  admit- lives with spouse.  pta indep with cane.     Action/Plan:   pt eval-   Anticipated DC Date:  06/25/2012   Anticipated DC Plan:  HOME W HOME HEALTH SERVICES      DC Planning Services  CM consult      Choice offered to / List presented to:             Status of service:  In process, will continue to follow Medicare Important Message given?   (If response is "NO", the following Medicare IM given date fields will be blank) Date Medicare IM given:   Date Additional Medicare IM given:    Discharge Disposition:    Per UR Regulation:  Reviewed for med. necessity/level of care/duration of stay  If discussed at Long Length of Stay Meetings, dates discussed:    Comments:  06/25/12 11:54 Letha Cape RN, BSN [phone] patient is for dc today, awaiting pt eval.  NCM will continue to follow for dc needs.  06/22/12 16:07 Letha Cape RN, BSN [phone] patient lives with spoue, pta indep with cane, pt states he has medicare but is in the doughnut hole, but he has a savings act and a credit card.  Informed him that we could not ast with medications since he has insurance, he will need to use his credit card or savings act.  NCM will continue to follow for dc needs.

## 2012-06-22 NOTE — Progress Notes (Signed)
Inpatient Diabetes Program Recommendations  AACE/ADA: New Consensus Statement on Inpatient Glycemic Control (2013)  Target Ranges:  Prepandial:   less than 140 mg/dL      Peak postprandial:   less than 180 mg/dL (1-2 hours)      Critically ill patients:  140 - 180 mg/dL   Reason for Visit: CBGs 5/1   205-336 mg/dl       5/2   098-119 mg/dl  Inpatient Diabetes Program Recommendations Insulin - Basal: Increase Lantus to 20 units daily if CBGs continue greater than 180 mg/dl.  Note:

## 2012-06-22 NOTE — Progress Notes (Signed)
Utilization review completed. Anaika Santillano, RN, BSN. 

## 2012-06-23 DIAGNOSIS — L02419 Cutaneous abscess of limb, unspecified: Secondary | ICD-10-CM

## 2012-06-23 DIAGNOSIS — K219 Gastro-esophageal reflux disease without esophagitis: Secondary | ICD-10-CM

## 2012-06-23 DIAGNOSIS — E1059 Type 1 diabetes mellitus with other circulatory complications: Secondary | ICD-10-CM

## 2012-06-23 DIAGNOSIS — K7030 Alcoholic cirrhosis of liver without ascites: Secondary | ICD-10-CM

## 2012-06-23 DIAGNOSIS — L03119 Cellulitis of unspecified part of limb: Secondary | ICD-10-CM

## 2012-06-23 LAB — CBC
HCT: 29.3 % — ABNORMAL LOW (ref 39.0–52.0)
MCHC: 33.8 g/dL (ref 30.0–36.0)
Platelets: 40 10*3/uL — ABNORMAL LOW (ref 150–400)
RDW: 15.4 % (ref 11.5–15.5)
WBC: 4.2 10*3/uL (ref 4.0–10.5)

## 2012-06-23 LAB — GLUCOSE, CAPILLARY: Glucose-Capillary: 259 mg/dL — ABNORMAL HIGH (ref 70–99)

## 2012-06-23 LAB — BASIC METABOLIC PANEL
BUN: 21 mg/dL (ref 6–23)
Chloride: 101 mEq/L (ref 96–112)
Creatinine, Ser: 1.25 mg/dL (ref 0.50–1.35)
GFR calc Af Amer: 64 mL/min — ABNORMAL LOW (ref >90)
GFR calc non Af Amer: 55 mL/min — ABNORMAL LOW (ref >90)

## 2012-06-23 NOTE — Progress Notes (Signed)
[facility] PROGRESS NOTE  [name] VHQ:[id number] DOB: 1935/08/21 DOA: 06/21/2012 PCP: Marga Melnick, MD  HPI/Subjective: Feels OK, family at bedside, eating, denies any fever  Assessment/Plan:  RLE cellulitis - Will cover with broad spectrum antibiotics, namely Zosyn and vancomycin. - Pain control, although limited by allergies and history of AMS after opiod administration.  - Will continue home regimen pain medication and escalate them carefully.  - Monitor Vitals and WBC levels  - Most likely related to poor circulation given history of CABG with saphenous vein harvesting.  - Elevate lower extremities.  Atrial fibrillation  - continue metoprolol  - not on coumadin given thrombocytopenia most likely   Cirrhosis  - stable will continue home regimen   Thrombocytopenia  - most likely related to cirrhosis  - will limit DVT prophylaxis   GERD  - Patient reported some indigestion while in the ED. Will place on protonix and reassess next am.   Low back pain chronic  - continue fentanyl at home dose. May have to escalate it due to # 1   DM  - SSI  - Continue Lantus at home dose  - Diabetic diet  - monitor CBG's   Code Status: Full code Family Communication:  Disposition Plan: Remains inpatient   Consultants:  None  Procedures:  None  Antibiotics:  Zosyn and Vancomycin    Objective: Filed Vitals:   06/22/12 1553 06/22/12 2146 06/23/12 0500 06/23/12 1218  BP: 145/65 144/53 147/64 132/56  Pulse: 57 64 63 61  Temp: 97.8 F (36.6 C) 99 F (37.2 C) 98.8 F (37.1 C)   TempSrc: Oral Oral Oral   Resp: 20 18 20    Height:      Weight:      SpO2: 100% 97% 97%     Intake/Output Summary (Last 24 hours) at 06/23/12 1312 Last data filed at 06/23/12 0900  Gross per 24 hour  Intake    480 ml  Output   2025 ml  Net  -1545 ml   Filed Weights   06/21/12 1700  Weight: 119.296 kg (263 lb)    Exam:  General: Alert and awake, oriented x3, not in  any acute distress. HEENT: anicteric sclera, pupils reactive to light and accommodation, EOMI CVS: S1-S2 clear, no murmur rubs or gallops Chest: clear to auscultation bilaterally, no wheezing, rales or rhonchi Abdomen: soft nontender, nondistended, normal bowel sounds, no organomegaly Extremities: no cyanosis, clubbing or edema noted bilaterally Neuro: Cranial nerves II-XII intact, no focal neurological deficits   Data Reviewed: Basic Metabolic Panel:  Recent Labs Lab 06/21/12 1400 06/21/12 1852 06/22/12 0520 06/23/12 0525  NA 144  --  137 137  K 4.2  --  3.7 3.5  CL 107  --  103 101  CO2 25  --  25 26  GLUCOSE 157*  --  224* 220*  BUN 21  --  24* 21  CREATININE 1.13  --  1.19 1.25  CALCIUM 9.6  --  8.6 8.4  MG  --  2.1  --   --   PHOS  --  2.7  --   --    Liver Function Tests:  Recent Labs Lab 06/21/12 1400 06/22/12 0520  AST 24 20  ALT 12 9  ALKPHOS 61 48  BILITOT 2.2* 1.8*  PROT 6.7 5.5*  ALBUMIN 3.6 2.8*   No results found for this basename: LIPASE, AMYLASE,  in the last 168 hours  Recent Labs Lab 06/21/12 1510  AMMONIA 35  CBC:  Recent Labs Lab 06/21/12 1400 06/22/12 0520 06/23/12 0525  WBC 7.2 5.1 4.2  NEUTROABS 6.3  --   --   HGB 12.3* 10.1* 9.9*  HCT 35.6* 29.3* 29.3*  MCV 93.4 92.4 93.0  PLT 50* 37* 40*   Cardiac Enzymes: No results found for this basename: CKTOTAL, CKMB, CKMBINDEX, TROPONINI,  in the last 168 hours BNP (last 3 results)  Recent Labs  03/02/12 0938 05/25/12 1149  PROBNP 395.0* 521.2*   CBG:  Recent Labs Lab 06/22/12 1206 06/22/12 1736 06/22/12 2147 06/23/12 0830 06/23/12 1112  GLUCAP 267* 291* 265* 259* 255*    Recent Results (from the past 240 hour(s))  CULTURE, BLOOD (ROUTINE X 2)     Status: None   Collection Time    06/21/12  1:50 PM      Result Value Range Status   Specimen Description BLOOD WRIST LEFT   Final   Special Requests BOTTLES DRAWN AEROBIC ONLY 7CC   Final   Culture  Setup Time  06/21/2012 17:27   Final   Culture     Final   Value:        BLOOD CULTURE RECEIVED NO GROWTH TO DATE CULTURE WILL BE HELD FOR 5 DAYS BEFORE ISSUING A FINAL NEGATIVE REPORT   Report Status PENDING   Incomplete  CULTURE, BLOOD (ROUTINE X 2)     Status: None   Collection Time    06/21/12  1:59 PM      Result Value Range Status   Specimen Description BLOOD HAND RIGHT   Final   Special Requests BOTTLES DRAWN AEROBIC ONLY 3CC   Final   Culture  Setup Time 06/21/2012 17:19   Final   Culture     Final   Value:        BLOOD CULTURE RECEIVED NO GROWTH TO DATE CULTURE WILL BE HELD FOR 5 DAYS BEFORE ISSUING A FINAL NEGATIVE REPORT   Report Status PENDING   Incomplete  URINE CULTURE     Status: None   Collection Time    06/21/12  5:20 PM      Result Value Range Status   Specimen Description URINE, RANDOM   Final   Special Requests NONE   Final   Culture  Setup Time 06/21/2012 18:20   Final   Colony Count NO GROWTH   Final   Culture NO GROWTH   Final   Report Status 06/22/2012 FINAL   Final     Studies: Dg Chest 2 View  06/21/2012  *RADIOLOGY REPORT*  Clinical Data: Fever  CHEST - 2 VIEW  Comparison: May 25, 2012.  Findings: Stable mild cardiomegaly. Status post coronary artery bypass graft.  No pneumothorax is noted.  Bony thorax is intact. Mild chronic central pulmonary vascular congestion is noted which is unchanged.  No acute pulmonary disease is noted.  IMPRESSION: No acute cardiopulmonary abnormality seen.   Original Report Authenticated By: Lupita Raider.,  M.D.     Scheduled Meds: . allopurinol  150 mg Oral Daily  . [START ON 06/24/2012] fentaNYL  50 mcg Transdermal Q72H  . ferrous sulfate  325 mg Oral BID  . furosemide  40 mg Oral BID  . gabapentin  300 mg Oral BID  . insulin aspart  0-20 Units Subcutaneous TID WC  . insulin glargine  15 Units Subcutaneous QHS  . isosorbide mononitrate  60 mg Oral Daily  . lactulose  20 g Oral BID  . loratadine  10 mg Oral Daily  .  metoprolol  tartrate  12.5 mg Oral BID  . pantoprazole  40 mg Oral Daily  . piperacillin-tazobactam (ZOSYN)  IV  3.375 g Intravenous Q8H  . predniSONE  5 mg Oral Daily  . rifaximin  550 mg Oral BID  . sodium chloride  3 mL Intravenous Q12H  . spironolactone  50 mg Oral Daily  . thiamine  125 mg Oral Daily  . vancomycin  1,000 mg Intravenous Q12H  . vitamin C  500 mg Oral Daily   Continuous Infusions:   Active Problems:   THROMBOCYTOPENIA, CHRONIC   GERD   CIRRHOSIS, ALCOHOLIC   LOW BACK PAIN, CHRONIC    ASCITES   Atrial fibrillation   Type I (juvenile type) diabetes mellitus with peripheral circulatory disorders, uncontrolled(250.73)   Cellulitis of right lower extremity    Time spent: 35 miutues    [facility] A  [facility] Pager [phone] If 7PM-7AM, please contact night-coverage at [email or web address], password [facility] 06/23/2012, 1:12 PM  LOS: 2 days

## 2012-06-24 DIAGNOSIS — I359 Nonrheumatic aortic valve disorder, unspecified: Secondary | ICD-10-CM

## 2012-06-24 LAB — GLUCOSE, CAPILLARY
Glucose-Capillary: 250 mg/dL — ABNORMAL HIGH (ref 70–99)
Glucose-Capillary: 270 mg/dL — ABNORMAL HIGH (ref 70–99)
Glucose-Capillary: 316 mg/dL — ABNORMAL HIGH (ref 70–99)
Glucose-Capillary: 255 mg/dL — ABNORMAL HIGH (ref 70–99)

## 2012-06-24 NOTE — Plan of Care (Signed)
Problem: Phase III Progression Outcomes Goal: Other Phase III Outcomes/Goals Outcome: Progressing Using aromatherapy on BLE per pt consent. Lavender compresses applied, cellulitis marked and dated on right leg

## 2012-06-24 NOTE — Progress Notes (Signed)
[facility] PROGRESS NOTE  [name] [id number]:[id number] DOB: September 07, 1935 DOA: 06/21/2012 PCP: Marga Melnick, MD  HPI/Subjective: Denies any fever or chills, feels better, redness is less. I asked him to keep his legs elevated  Assessment/Plan:  RLE cellulitis - Will cover with broad spectrum antibiotics, namely Zosyn and vancomycin. - Pain control, although limited by allergies and history of AMS after opiod administration.  - Will continue home regimen pain medication and escalate them carefully.  - Monitor Vitals and WBC levels  - Most likely related to poor circulation given history of CABG with saphenous vein harvesting.  - Elevate lower extremities.  Atrial fibrillation  - continue metoprolol  - not on coumadin given thrombocytopenia most likely   Cirrhosis  - stable will continue home regimen   Thrombocytopenia  - most likely related to cirrhosis, chronic and stable. - will limit DVT prophylaxis   GERD  - Patient reported some indigestion while in the ED. Will place on protonix and reassess next am.   Low back pain chronic  - continue fentanyl at home dose. May have to escalate it due to # 1   DM  - SSI  - Continue Lantus at home dose  - Diabetic diet  - monitor CBG's   Code Status: Full code Family Communication:  Disposition Plan: Remains inpatient   Consultants:  None  Procedures:  None  Antibiotics:  Zosyn and Vancomycin    Objective: Filed Vitals:   06/23/12 1218 06/23/12 1432 06/23/12 2132 06/24/12 0603  BP: 132/56 125/59 139/65 145/66  Pulse: 61 54 58 53  Temp:  97.6 F (36.4 C) 98.2 F (36.8 C) 98 F (36.7 C)  TempSrc:  Oral Oral Oral  Resp:  18 18 18   Height:      Weight:      SpO2:  100% 96% 94%    Intake/Output Summary (Last 24 hours) at 06/24/12 0902 Last data filed at 06/24/12 0530  Gross per 24 hour  Intake    900 ml  Output   2750 ml  Net  -1850 ml   Filed Weights   06/21/12 1700  Weight: 119.296 kg (263 lb)     Exam:  General: Alert and awake, oriented x3, not in any acute distress. HEENT: anicteric sclera, pupils reactive to light and accommodation, EOMI CVS: S1-S2 clear, no murmur rubs or gallops Chest: clear to auscultation bilaterally, no wheezing, rales or rhonchi Abdomen: soft nontender, nondistended, normal bowel sounds, no organomegaly Extremities: no cyanosis, clubbing or edema noted bilaterally Neuro: Cranial nerves II-XII intact, no focal neurological deficits   Data Reviewed: Basic Metabolic Panel:  Recent Labs Lab 06/21/12 1400 06/21/12 1852 06/22/12 0520 06/23/12 0525  NA 144  --  137 137  K 4.2  --  3.7 3.5  CL 107  --  103 101  CO2 25  --  25 26  GLUCOSE 157*  --  224* 220*  BUN 21  --  24* 21  CREATININE 1.13  --  1.19 1.25  CALCIUM 9.6  --  8.6 8.4  MG  --  2.1  --   --   PHOS  --  2.7  --   --    Liver Function Tests:  Recent Labs Lab 06/21/12 1400 06/22/12 0520  AST 24 20  ALT 12 9  ALKPHOS 61 48  BILITOT 2.2* 1.8*  PROT 6.7 5.5*  ALBUMIN 3.6 2.8*   No results found for this basename: LIPASE, AMYLASE,  in the last 168  hours  Recent Labs Lab 06/21/12 1510  AMMONIA 35   CBC:  Recent Labs Lab 06/21/12 1400 06/22/12 0520 06/23/12 0525  WBC 7.2 5.1 4.2  NEUTROABS 6.3  --   --   HGB 12.3* 10.1* 9.9*  HCT 35.6* 29.3* 29.3*  MCV 93.4 92.4 93.0  PLT 50* 37* 40*   Cardiac Enzymes: No results found for this basename: CKTOTAL, CKMB, CKMBINDEX, TROPONINI,  in the last 168 hours BNP (last 3 results)  Recent Labs  03/02/12 0938 05/25/12 1149  PROBNP 395.0* 521.2*   CBG:  Recent Labs Lab 06/23/12 0830 06/23/12 1112 06/23/12 1656 06/23/12 2129 06/24/12 0736  GLUCAP 259* 255* 267* 314* 250*    Recent Results (from the past 240 hour(s))  CULTURE, BLOOD (ROUTINE X 2)     Status: None   Collection Time    06/21/12  1:50 PM      Result Value Range Status   Specimen Description BLOOD WRIST LEFT   Final   Special Requests BOTTLES  DRAWN AEROBIC ONLY 7CC   Final   Culture  Setup Time 06/21/2012 17:27   Final   Culture     Final   Value:        BLOOD CULTURE RECEIVED NO GROWTH TO DATE CULTURE WILL BE HELD FOR 5 DAYS BEFORE ISSUING A FINAL NEGATIVE REPORT   Report Status PENDING   Incomplete  CULTURE, BLOOD (ROUTINE X 2)     Status: None   Collection Time    06/21/12  1:59 PM      Result Value Range Status   Specimen Description BLOOD HAND RIGHT   Final   Special Requests BOTTLES DRAWN AEROBIC ONLY 3CC   Final   Culture  Setup Time 06/21/2012 17:19   Final   Culture     Final   Value:        BLOOD CULTURE RECEIVED NO GROWTH TO DATE CULTURE WILL BE HELD FOR 5 DAYS BEFORE ISSUING A FINAL NEGATIVE REPORT   Report Status PENDING   Incomplete  URINE CULTURE     Status: None   Collection Time    06/21/12  5:20 PM      Result Value Range Status   Specimen Description URINE, RANDOM   Final   Special Requests NONE   Final   Culture  Setup Time 06/21/2012 18:20   Final   Colony Count NO GROWTH   Final   Culture NO GROWTH   Final   Report Status 06/22/2012 FINAL   Final     Studies: No results found.  Scheduled Meds: . allopurinol  150 mg Oral Daily  . fentaNYL  50 mcg Transdermal Q72H  . ferrous sulfate  325 mg Oral BID  . furosemide  40 mg Oral BID  . gabapentin  300 mg Oral BID  . insulin aspart  0-20 Units Subcutaneous TID WC  . insulin glargine  15 Units Subcutaneous QHS  . isosorbide mononitrate  60 mg Oral Daily  . lactulose  20 g Oral BID  . loratadine  10 mg Oral Daily  . metoprolol tartrate  12.5 mg Oral BID  . pantoprazole  40 mg Oral Daily  . piperacillin-tazobactam (ZOSYN)  IV  3.375 g Intravenous Q8H  . predniSONE  5 mg Oral Daily  . rifaximin  550 mg Oral BID  . sodium chloride  3 mL Intravenous Q12H  . spironolactone  50 mg Oral Daily  . thiamine  125 mg Oral Daily  . vancomycin  1,000 mg Intravenous Q12H  . vitamin C  500 mg Oral Daily   Continuous Infusions:   Active Problems:    THROMBOCYTOPENIA, CHRONIC   GERD   CIRRHOSIS, ALCOHOLIC   LOW BACK PAIN, CHRONIC    ASCITES   Atrial fibrillation   Type I (juvenile type) diabetes mellitus with peripheral circulatory disorders, uncontrolled(250.73)   Cellulitis of right lower extremity    Time spent: 35 miutues    [facility] A  [facility] Pager [phone] If 7PM-7AM, please contact night-coverage at [email or web address], password [facility] 06/24/2012, 9:02 AM  LOS: 3 days

## 2012-06-24 NOTE — Progress Notes (Signed)
ANTIBIOTIC CONSULT NOTE - FOLLOW UP  Pharmacy Consult for Vanco/Zosyn Indication: RLE cellulitis  Allergies  Allergen Reactions  . Shellfish-Derived Products     Rash & angioedema  . Amoxicillin-Pot Clavulanate     Unsure what reaction was but pt tolerated penicillin 05/25/2012  . Aspirin     REACTION: unable to tolerate due to cirrhosis and varices  . Buprenorphine Hcl-Naloxone Hcl Other (See Comments)    unresponsive  . Codeine     Rash    . Ezetimibe     REACTION: LEG/JOINT PAIN  . Hydromorphone Hcl     unknown  . Meperidine Hcl     unknown  . Morphine     unknown  . Morphine Sulfate     REACTION: unspecified  . Oxycodone     hallucinations  . Sulfonamide Derivatives     Patient Measurements: Height: 6\' 4"  (193 cm) Weight: 263 lb (119.296 kg) IBW/kg (Calculated) : 86.8 Adjusted Body Weight:    Vital Signs: Temp: 98 F (36.7 C) (05/04 0603) Temp src: Oral (05/04 0603) BP: 124/66 mmHg (05/04 0955) Pulse Rate: 58 (05/04 0955) Intake/Output from previous day: 05/03 0701 - 05/04 0700 In: 1140 [P.O.:940; IV Piggyback:200] Out: 2975 [Urine:2975] Intake/Output from this shift: Total I/O In: 360 [P.O.:360] Out: -   Labs:  Recent Labs  06/21/12 1400 06/22/12 0520 06/23/12 0525  WBC 7.2 5.1 4.2  HGB 12.3* 10.1* 9.9*  PLT 50* 37* 40*  CREATININE 1.13 1.19 1.25   Estimated Creatinine Clearance: 73.2 ml/min (by C-G formula based on Cr of 1.25). No results found for this basename: VANCOTROUGH, VANCOPEAK, VANCORANDOM, GENTTROUGH, GENTPEAK, GENTRANDOM, TOBRATROUGH, TOBRAPEAK, TOBRARND, AMIKACINPEAK, AMIKACINTROU, AMIKACIN,  in the last 72 hours   Microbiology: Recent Results (from the past 720 hour(s))  CULTURE, BLOOD (ROUTINE X 2)     Status: None   Collection Time    06/21/12  1:50 PM      Result Value Range Status   Specimen Description BLOOD WRIST LEFT   Final   Special Requests BOTTLES DRAWN AEROBIC ONLY 7CC   Final   Culture  Setup Time 06/21/2012  17:27   Final   Culture     Final   Value:        BLOOD CULTURE RECEIVED NO GROWTH TO DATE CULTURE WILL BE HELD FOR 5 DAYS BEFORE ISSUING A FINAL NEGATIVE REPORT   Report Status PENDING   Incomplete  CULTURE, BLOOD (ROUTINE X 2)     Status: None   Collection Time    06/21/12  1:59 PM      Result Value Range Status   Specimen Description BLOOD HAND RIGHT   Final   Special Requests BOTTLES DRAWN AEROBIC ONLY 3CC   Final   Culture  Setup Time 06/21/2012 17:19   Final   Culture     Final   Value:        BLOOD CULTURE RECEIVED NO GROWTH TO DATE CULTURE WILL BE HELD FOR 5 DAYS BEFORE ISSUING A FINAL NEGATIVE REPORT   Report Status PENDING   Incomplete  URINE CULTURE     Status: None   Collection Time    06/21/12  5:20 PM      Result Value Range Status   Specimen Description URINE, RANDOM   Final   Special Requests NONE   Final   Culture  Setup Time 06/21/2012 18:20   Final   Colony Count NO GROWTH   Final   Culture NO GROWTH   Final  Report Status 06/22/2012 FINAL   Final    Anti-infectives   Start     Dose/Rate Route Frequency Ordered Stop   06/21/12 2200  rifaximin (XIFAXAN) tablet 550 mg     550 mg Oral 2 times daily 06/21/12 1740     06/21/12 2200  piperacillin-tazobactam (ZOSYN) IVPB 3.375 g     3.375 g 12.5 mL/hr over 240 Minutes Intravenous 3 times per day 06/21/12 1812     06/21/12 1900  vancomycin (VANCOCIN) IVPB 1000 mg/200 mL premix     1,000 mg 200 mL/hr over 60 Minutes Intravenous NOW 06/21/12 1813 06/21/12 2126   06/21/12 1530  vancomycin (VANCOCIN) IVPB 1000 mg/200 mL premix     1,000 mg 200 mL/hr over 60 Minutes Intravenous  Once 06/21/12 1521 06/21/12 1735   06/21/12 1515  vancomycin (VANCOCIN) injection 1 g  Status:  Discontinued     1 g Intravenous  Once 06/21/12 1507 06/21/12 1520   06/21/12 1515  piperacillin-tazobactam (ZOSYN) IVPB 3.375 g     3.375 g 12.5 mL/hr over 240 Minutes Intravenous  Once 06/21/12 1510 06/21/12 1623   06/21/12 0600  vancomycin  (VANCOCIN) IVPB 1000 mg/200 mL premix     1,000 mg 200 mL/hr over 60 Minutes Intravenous Every 12 hours 06/21/12 1813        Assessment: RLE cellulitis, fever, chills, rigors  Anticoagulation SCDs 2nd low pltc 2nd etoh. Platelets 40.  Infectious Disease: H/O recurrent RLE cellulitis. Pt received Zosyn/Vancomycin (pt with unknown allergy to Augmentin but tolerated penicillin earlier this month and tolerated Zosyn in ED). Afebrile, WBC 4.2.  5/1 Vanc>> 5/1 Zosyn>> Rifaximin PTA and here for hepatic encephalopathy  5/1 Urine>> Negative 5/1 Bld x 2>> pending   Goal of Therapy:  Vancomycin trough level 10-15 mcg/ml  Plan:  1. Zosyn 3.375gm IV q8h.  2. Vanco 1g IV q12h. Trough tomorrow with am labs.   Zanae Kuehnle S. Merilynn Finland, PharmD, BCPS Clinical Staff Pharmacist Pager [phone]  Misty Stanley Stillinger 06/24/2012,11:12 AM

## 2012-06-24 NOTE — Progress Notes (Signed)
Pt educated on the use of aromatherapy for cellulitis.Gave permission to try lavender compresses on BLE. Using 6 drops of lavender in 16 oz of water with a small amount of witch  Hazel for emulsion. Applied 4 x 4 compresses soaked in lavender infusion to BLE from ankle to knees. Will leave on for 1 hour and take off for one hour.   Peter Congo RN

## 2012-06-25 ENCOUNTER — Ambulatory Visit: Payer: Medicare Other | Admitting: Internal Medicine

## 2012-06-25 LAB — VANCOMYCIN, TROUGH: Vancomycin Tr: 40.3 ug/mL (ref 10.0–20.0)

## 2012-06-25 LAB — CBC
HCT: 30.3 % — ABNORMAL LOW (ref 39.0–52.0)
RDW: 14.7 % (ref 11.5–15.5)
WBC: 4.1 10*3/uL (ref 4.0–10.5)

## 2012-06-25 LAB — BASIC METABOLIC PANEL
Chloride: 98 mEq/L (ref 96–112)
Creatinine, Ser: 1.21 mg/dL (ref 0.50–1.35)
GFR calc Af Amer: 66 mL/min — ABNORMAL LOW (ref >90)
Potassium: 3.7 mEq/L (ref 3.5–5.1)

## 2012-06-25 LAB — GLUCOSE, CAPILLARY: Glucose-Capillary: 222 mg/dL — ABNORMAL HIGH (ref 70–99)

## 2012-06-25 MED ORDER — DOXYCYCLINE HYCLATE 100 MG PO TABS: 100.0000 mg | ORAL_TABLET | Freq: Two times a day (BID) | ORAL | Status: DC

## 2012-06-25 MED ORDER — VITAMIN B-1 100 MG PO TABS
100.0000 mg | ORAL_TABLET | Freq: Every day | ORAL | Status: DC
  Administered 2012-06-25: 100 mg via ORAL
  Filled 2012-06-25: qty 1

## 2012-06-25 MED ORDER — CEPHALEXIN 500 MG PO CAPS: 500.0000 mg | ORAL_CAPSULE | Freq: Two times a day (BID) | ORAL | Status: DC

## 2012-06-25 NOTE — Progress Notes (Signed)
Inpatient Diabetes Program Recommendations  AACE/ADA: New Consensus Statement on Inpatient Glycemic Control (2013)  Target Ranges:  Prepandial:   less than 140 mg/dL      Peak postprandial:   less than 180 mg/dL (1-2 hours)      Critically ill patients:  140 - 180 mg/dL   Hyperglycemia while in the hospital.  Inpatient Diabetes Program Recommendations Insulin - Basal: xxx Insulin - Meal Coverage: Please add meal coverage of 5 units tidwc (Pt takes Novolog meal coverage at home: 30 units with BF, 30 units with lunch, and 60 units with supper.  Thank you, Lenor Coffin, RN, CNS, Diabetes Coordinator [phone])

## 2012-06-25 NOTE — Progress Notes (Signed)
CRITICAL VALUE ALERT  Critical value received: Vanc trough: 40.3  Date of notification:  06/25/12  Time of notification:  0730  Critical value read back:yes  Nurse who received alert:  Peter Congo  MD notified (1st page):  Arthor Captain, pharmacy  Time of first page:  0730  MD notified (2nd page):  Time of second page:  Responding MD:  Arthor Captain  Time MD responded:  0730

## 2012-06-25 NOTE — Evaluation (Signed)
Physical Therapy Evaluation Patient Details Name: [name] MRN: [id number] DOB: 1935-06-10 Today's Date: 06/25/2012 Time: 2841-3244 PT Time Calculation (min): 21 min  PT Assessment / Plan / Recommendation Clinical Impression  77 yo male with hx of lympedema admitted for recurrent cellulitis.  Pt with recent hospitalization  and is deconditioned.  He would benefit form use of rollator  at home to promote safety for ambulation and provide opportunity for him to rest as needed. Wife given phone numbers to call for possilbe lymphedema follow up, but she has already called a few places and is on a waiting list.  Pt does not need HHPT at home    PT Assessment  All further PT needs can be met in the next venue of care    Follow Up Recommendations  No PT follow up    Does the patient have the potential to tolerate intense rehabilitation      Barriers to Discharge        Equipment Recommendations  Other (comment) (Rollator)    Recommendations for Other Services     Frequency      Precautions / Restrictions     Pertinent Vitals/Pain Some pain in RLE with ambulation      Mobility  Bed Mobility Bed Mobility: Supine to Sit;Sit to Supine;Rolling Right;Rolling Left Rolling Right: 7: Independent Rolling Left: 7: Independent Supine to Sit: 7: Independent Sit to Supine: 7: Independent Transfers Transfers: Sit to Stand;Stand to Sit Sit to Stand: 6: Modified independent (Device/Increase time) Stand to Sit: 6: Modified independent (Device/Increase time) Details for Transfer Assistance: pt needs extra time Ambulation/Gait Ambulation/Gait Assistance: 6: Modified independent (Device/Increase time) Ambulation Distance (Feet): 100 Feet Assistive device: Rolling walker;4-wheeled walker Ambulation/Gait Assistance Details: pt has some unsteadiness in gait that is improved with assistive device.  He is able to manage brakes and sit on rollator and is happy to have the seat available when he  fatigues Gait Pattern: Trunk flexed;Decreased step length - left;Decreased stance time - right Gait velocity: decreased General Gait Details: Gait is limited by pain from cellulitis and lymphedema.  Stairs: No    Exercises Other Exercises Other Exercises: Pt encouragaed to do deep abdominal breathing Other Exercises: pt familiar with AROM of LE   PT Diagnosis: Generalized weakness  PT Problem List: Other (comment);Decreased mobility (lymphedema RLE) PT Treatment Interventions:     PT Goals    Visit Information  Last PT Received On: 06/25/12 Assistance Needed: +1    Subjective Data  Subjective: wife reports they have been on the waiting list for lymphedema treatment since October Patient Stated Goal: to do anything to get legs down and keep from getting infections   Prior Functioning  Home Living Lives With: Spouse Available Help at Discharge: Family Type of Home: House Home Access: Elevator Home Layout: One level Home Adaptive Equipment: Other (comment);Bedside commode/3-in-1 (lift chair) Additional Comments: Pt thinks he would like a walker rollator with a seat on it because he gets tired easily while walking Prior Function Level of Independence: Independent with assistive device(s) Comments: wife reports pt is doing better with walking with a walker in the room Communication Communication: No difficulties    Cognition  Cognition Arousal/Alertness: Awake/alert Behavior During Therapy: WFL for tasks assessed/performed Overall Cognitive Status: Within Functional Limits for tasks assessed    Extremity/Trunk Assessment Right Lower Extremity Assessment RLE ROM/Strength/Tone: Deficits RLE ROM/Strength/Tone Deficits: Pt with hx of lymphedema in leg and foot. and has edema with trophic changes in skin in this  tme.  Pt with redness on lower leg and marks from cellulitis.  wife reports pt has had unna boots in the past and did have reduction from edema, but it did not  maintain Left Lower Extremity Assessment LLE ROM/Strength/Tone: Deficits LLE ROM/Strength/Tone Deficits: Pt with scabbed ulcer on anterior shin.  No edema in this leg   Balance Balance Balance Assessed: Yes Static Sitting Balance Static Sitting - Balance Support: No upper extremity supported Static Sitting - Level of Assistance: 7: Independent Static Standing Balance Static Standing - Balance Support: Bilateral upper extremity supported Static Standing - Level of Assistance: 6: Modified independent (Device/Increase time)  End of Session PT - End of Session Activity Tolerance: Patient tolerated treatment well Patient left: in chair Nurse Communication: Mobility status  GP     Donnetta Hail 06/25/2012, 1:17 PM

## 2012-06-25 NOTE — Discharge Summary (Signed)
Physician Discharge Summary  Deundra R Licea ZOX:[id number] DOB: 1935-10-05 DOA: 06/21/2012  PCP: Marga Melnick, MD  Admit date: 06/21/2012 Discharge date: 06/25/2012  Time spent: 40 minutes  Recommendations for Outpatient Follow-up:  1. Followup with primary care physician in one week, followup with the [facility] infectious [facility] on Wednesday (in 2 days)  Discharge Diagnoses:  Active Problems:   THROMBOCYTOPENIA, CHRONIC   GERD   CIRRHOSIS, ALCOHOLIC   LOW BACK PAIN, CHRONIC    ASCITES   Atrial fibrillation   Type I (juvenile type) diabetes mellitus with peripheral circulatory disorders, uncontrolled(250.73)   Cellulitis of right lower extremity   Discharge Condition: Stable  Diet recommendation: Carbohydrate modified diet  Filed Weights   06/21/12 1700  Weight: 119.296 kg (263 lb)    History of present illness:  [name] is a 77 y.o. male  With pertinent history of CAD s/p CABG with saphenous vein harvesting from both legs but reportedly more on the right and history of recurrent cellulitis to RLE. Presented today to the ED after developing fever, chills, and rigors. Patient also endorses RLE discomfort. His RLE is more edematous and erythematous than his LLE. Nothing he is aware of makes it better but ambulating makes his pain worse. He denies any chest pain or shortness of breath. The problem was first noticed today and it has persisted.  While in the ED patient was found to have fever with an erythematous RLE which was painful to the touch. We were consulted for admission evaluation and recommendations regarding his RLE cellulitis.  Hospital Course:   1. Right lower extremity cellulitis: After admission to the hospital patient was covered with broad-spectrum antibiotics, Zosyn and vancomycin. Pain is controlled with narcotics. The redness and no warmth is getting better everyday, patient said he falls with wound clinic and Dr. Ninetta Lights of the infectious disease, on the  day of discharge the medication was changed to doxycycline and cephalexin for 7 more days.  2. Lower extremity edema: Bilateral but Right more than left, patient has had CABG with harvesting of the right saphenous vein, he also has chronic liver disease with low albumin. These factors contributes to chronic lower extremity edema, unfortunately when you have repeated cellulitis in the same area especially if there is presence of venous insufficiency, cellulitis tends to recur because of the damaged and scarred lymphatics. Patient voiced understanding after this explained to him.  3. Liver cirrhosis: Alcoholic liver cirrhosis, patient has thrombocytopenia, hypoalbuminemia and ascites. He is relatively doing well was compensated liver disease, he is a minor level is normal on admission, he is on lactulose and rifaximin.  4. Diabetes mellitus type 2: His old medication continued throughout the hospital stay, he is on Lantus insulin. Insulin sliding scale was utilized as well as carbohydrate modified diet.  5. Atrial fibrillation: Patient is not on antiplatelets or anticoagulant likely secondary to risk of bleeding with liver disease.   Procedures:  None  Consultations:  None  Discharge Exam: Filed Vitals:   06/24/12 1532 06/24/12 2155 06/25/12 0500 06/25/12 1023  BP: 138/66 158/75 138/68 134/52  Pulse: 56 57 52 54  Temp: 98.5 F (36.9 C) 98.2 F (36.8 C) 98.1 F (36.7 C)   TempSrc: Oral Oral Oral   Resp: 18 21 18    Height:      Weight:      SpO2: 93% 97% 95%    General: Alert and awake, oriented x3, not in any acute distress. HEENT: anicteric sclera, pupils reactive  to light and accommodation, EOMI CVS: S1-S2 clear, no murmur rubs or gallops Chest: clear to auscultation bilaterally, no wheezing, rales or rhonchi Abdomen: soft nontender, nondistended, normal bowel sounds, no organomegaly Extremities: Bilateral lower extremity edema Neuro: Cranial nerves II-XII intact, no focal  neurological deficits  Discharge Instructions   Future Appointments Provider Department Dept Phone   06/27/2012 9:45 AM Ginnie Smart, MD [facility] for Infectious Disease [phone]   07/05/2012 8:30 AM Romero Belling, MD [facility] PR[facility] ENDOCRINOLOGY [phone]   07/13/2012 1:30 PM Iva Boop, MD [facility] [phone]       Medication List    STOP taking these medications       penicillin v potassium 250 MG tablet  Commonly known as:  VEETID      TAKE these medications       allopurinol 300 MG tablet  Commonly known as:  ZYLOPRIM  Take 150 mg by mouth daily.     CALCIUM/MAGNESIUM/ZINC FORMULA 1000-500-50 MG Tabs  Generic drug:  Calcium-Magnesium-Zinc  Take 1 tablet by mouth daily.     cephALEXin 500 MG capsule  Commonly known as:  KEFLEX  Take 1 capsule (500 mg total) by mouth 2 (two) times daily.     doxycycline 100 MG tablet  Commonly known as:  VIBRA-TABS  Take 1 tablet (100 mg total) by mouth 2 (two) times daily.     fentaNYL 50 MCG/HR  Commonly known as:  DURAGESIC - dosed mcg/hr  Place 1 patch onto the skin every 3 (three) days.     ferrous sulfate 325 (65 FE) MG tablet  Take 325 mg by mouth 2 (two) times daily.     furosemide 40 MG tablet  Commonly known as:  LASIX  Take 1 tablet (40 mg total) by mouth 2 (two) times daily.     gabapentin 300 MG capsule  Commonly known as:  NEURONTIN  Take 300 mg by mouth 2 (two) times daily.     glucose blood test strip  Commonly known as:  FREESTYLE LITE  1 each by Other route 2 (two) times daily. And lancets 250.01     insulin aspart 100 UNIT/ML injection  Commonly known as:  novoLOG  Inject 30-60 Units into the skin 3 (three) times daily before meals. 30 units with breakfast, 30 units with lunch and 60 units with the evening meal and take 5 extra units with any blood sugar over 200     insulin glargine 100 UNIT/ML injection  Commonly known as:  LANTUS   Inject 15 Units into the skin at bedtime.     isosorbide mononitrate 60 MG 24 hr tablet  Commonly known as:  IMDUR  Take 1 tablet (60 mg total) by mouth daily.     lactulose 10 GM/15ML solution  Commonly known as:  CHRONULAC  Take 20 g by mouth 2 (two) times daily.     loratadine 10 MG tablet  Commonly known as:  CLARITIN  Take 10 mg by mouth daily.     metoprolol tartrate 25 MG tablet  Commonly known as:  LOPRESSOR  Take 12.5 mg by mouth 2 (two) times daily.     nitroGLYCERIN 0.4 MG SL tablet  Commonly known as:  NITROSTAT  Place 1 tablet (0.4 mg total) under the tongue every 5 (five) minutes as needed for chest pain.     omeprazole 20 MG capsule  Commonly known as:  PRILOSEC  Take 20 mg by mouth daily.  polyethylene glycol packet  Commonly known as:  MIRALAX / GLYCOLAX  Take 17 g by mouth daily as needed. Take two or three times a week     predniSONE 5 MG tablet  Commonly known as:  DELTASONE  Take 5 mg by mouth daily.     rifaximin 550 MG Tabs  Commonly known as:  XIFAXAN  Take 1 tablet (550 mg total) by mouth 2 (two) times daily.     spironolactone 50 MG tablet  Commonly known as:  ALDACTONE  Take 50 mg by mouth daily.     vitamin B-1 250 MG tablet  Take 125 mg by mouth daily.     vitamin C 500 MG tablet  Commonly known as:  ASCORBIC ACID  Take 500 mg by mouth daily.       Allergies  Allergen Reactions  . Shellfish-Derived Products     Rash & angioedema  . Amoxicillin-Pot Clavulanate     Unsure what reaction was but pt tolerated penicillin 05/25/2012  . Aspirin     REACTION: unable to tolerate due to cirrhosis and varices  . Buprenorphine Hcl-Naloxone Hcl Other (See Comments)    unresponsive  . Codeine     Rash    . Ezetimibe     REACTION: LEG/JOINT PAIN  . Hydromorphone Hcl     unknown  . Meperidine Hcl     unknown  . Morphine     unknown  . Morphine Sulfate     REACTION: unspecified  . Oxycodone     hallucinations  . Sulfonamide  Derivatives        Follow-up Information   Follow up with Marga Melnick, MD.   Contact information:   [phone] [facility] [address] [place] [address] [phone]        The results of significant diagnostics from this hospitalization (including imaging, microbiology, ancillary and laboratory) are listed below for reference.    Significant Diagnostic Studies: Dg Chest 2 View  06/21/2012  *RADIOLOGY REPORT*  Clinical Data: Fever  CHEST - 2 VIEW  Comparison: May 25, 2012.  Findings: Stable mild cardiomegaly. Status post coronary artery bypass graft.  No pneumothorax is noted.  Bony thorax is intact. Mild chronic central pulmonary vascular congestion is noted which is unchanged.  No acute pulmonary disease is noted.  IMPRESSION: No acute cardiopulmonary abnormality seen.   Original Report Authenticated By: Lupita Raider.,  M.D.     Microbiology: Recent Results (from the past 240 hour(s))  CULTURE, BLOOD (ROUTINE X 2)     Status: None   Collection Time    06/21/12  1:50 PM      Result Value Range Status   Specimen Description BLOOD WRIST LEFT   Final   Special Requests BOTTLES DRAWN AEROBIC ONLY 7CC   Final   Culture  Setup Time 06/21/2012 17:27   Final   Culture     Final   Value:        BLOOD CULTURE RECEIVED NO GROWTH TO DATE CULTURE WILL BE HELD FOR 5 DAYS BEFORE ISSUING A FINAL NEGATIVE REPORT   Report Status PENDING   Incomplete  CULTURE, BLOOD (ROUTINE X 2)     Status: None   Collection Time    06/21/12  1:59 PM      Result Value Range Status   Specimen Description BLOOD HAND RIGHT   Final   Special Requests BOTTLES DRAWN AEROBIC ONLY 3CC   Final   Culture  Setup Time  06/21/2012 17:19   Final   Culture     Final   Value:        BLOOD CULTURE RECEIVED NO GROWTH TO DATE CULTURE WILL BE HELD FOR 5 DAYS BEFORE ISSUING A FINAL NEGATIVE REPORT   Report Status PENDING   Incomplete  URINE CULTURE     Status: None   Collection Time    06/21/12  5:20 PM       Result Value Range Status   Specimen Description URINE, RANDOM   Final   Special Requests NONE   Final   Culture  Setup Time 06/21/2012 18:20   Final   Colony Count NO GROWTH   Final   Culture NO GROWTH   Final   Report Status 06/22/2012 FINAL   Final     Labs: Basic Metabolic Panel:  Recent Labs Lab 06/21/12 1400 06/21/12 1852 06/22/12 0520 06/23/12 0525 06/25/12 0556  NA 144  --  137 137 135  K 4.2  --  3.7 3.5 3.7  CL 107  --  103 101 98  CO2 25  --  25 26 28   GLUCOSE 157*  --  224* 220* 261*  BUN 21  --  24* 21 20  CREATININE 1.13  --  1.19 1.25 1.21  CALCIUM 9.6  --  8.6 8.4 8.9  MG  --  2.1  --   --   --   PHOS  --  2.7  --   --   --    Liver Function Tests:  Recent Labs Lab 06/21/12 1400 06/22/12 0520  AST 24 20  ALT 12 9  ALKPHOS 61 48  BILITOT 2.2* 1.8*  PROT 6.7 5.5*  ALBUMIN 3.6 2.8*   No results found for this basename: LIPASE, AMYLASE,  in the last 168 hours  Recent Labs Lab 06/21/12 1510  AMMONIA 35   CBC:  Recent Labs Lab 06/21/12 1400 06/22/12 0520 06/23/12 0525 06/25/12 0556  WBC 7.2 5.1 4.2 4.1  NEUTROABS 6.3  --   --   --   HGB 12.3* 10.1* 9.9* 10.2*  HCT 35.6* 29.3* 29.3* 30.3*  MCV 93.4 92.4 93.0 92.1  PLT 50* 37* 40* 54*   Cardiac Enzymes: No results found for this basename: CKTOTAL, CKMB, CKMBINDEX, TROPONINI,  in the last 168 hours BNP: BNP (last 3 results)  Recent Labs  03/02/12 0938 05/25/12 1149  PROBNP 395.0* 521.2*   CBG:  Recent Labs Lab 06/24/12 0736 06/24/12 1226 06/24/12 1713 06/24/12 2154 06/25/12 0736  GLUCAP 250* 270* 316* 255* 222*       Signed:  Mahek Schlesinger A  [facility] 06/25/2012, 12:03 PM

## 2012-06-25 NOTE — Progress Notes (Signed)
[name] to be D/C'd Home per MD order.  Discussed with the patient and all questions fully answered.    Medication List    STOP taking these medications       penicillin v potassium 250 MG tablet  Commonly known as:  VEETID      TAKE these medications       allopurinol 300 MG tablet  Commonly known as:  ZYLOPRIM  Take 150 mg by mouth daily.     CALCIUM/MAGNESIUM/ZINC FORMULA 1000-500-50 MG Tabs  Generic drug:  Calcium-Magnesium-Zinc  Take 1 tablet by mouth daily.     cephALEXin 500 MG capsule  Commonly known as:  KEFLEX  Take 1 capsule (500 mg total) by mouth 2 (two) times daily.     doxycycline 100 MG tablet  Commonly known as:  VIBRA-TABS  Take 1 tablet (100 mg total) by mouth 2 (two) times daily.     fentaNYL 50 MCG/HR  Commonly known as:  DURAGESIC - dosed mcg/hr  Place 1 patch onto the skin every 3 (three) days.     ferrous sulfate 325 (65 FE) MG tablet  Take 325 mg by mouth 2 (two) times daily.     furosemide 40 MG tablet  Commonly known as:  LASIX  Take 1 tablet (40 mg total) by mouth 2 (two) times daily.     gabapentin 300 MG capsule  Commonly known as:  NEURONTIN  Take 300 mg by mouth 2 (two) times daily.     glucose blood test strip  Commonly known as:  FREESTYLE LITE  1 each by Other route 2 (two) times daily. And lancets 250.01     insulin aspart 100 UNIT/ML injection  Commonly known as:  novoLOG  Inject 30-60 Units into the skin 3 (three) times daily before meals. 30 units with breakfast, 30 units with lunch and 60 units with the evening meal and take 5 extra units with any blood sugar over 200     insulin glargine 100 UNIT/ML injection  Commonly known as:  LANTUS  Inject 15 Units into the skin at bedtime.     isosorbide mononitrate 60 MG 24 hr tablet  Commonly known as:  IMDUR  Take 1 tablet (60 mg total) by mouth daily.     lactulose 10 GM/15ML solution  Commonly known as:  CHRONULAC  Take 20 g by mouth 2 (two) times daily.     loratadine 10 MG tablet  Commonly known as:  CLARITIN  Take 10 mg by mouth daily.     metoprolol tartrate 25 MG tablet  Commonly known as:  LOPRESSOR  Take 12.5 mg by mouth 2 (two) times daily.     nitroGLYCERIN 0.4 MG SL tablet  Commonly known as:  NITROSTAT  Place 1 tablet (0.4 mg total) under the tongue every 5 (five) minutes as needed for chest pain.     omeprazole 20 MG capsule  Commonly known as:  PRILOSEC  Take 20 mg by mouth daily.     polyethylene glycol packet  Commonly known as:  MIRALAX / GLYCOLAX  Take 17 g by mouth daily as needed. Take two or three times a week     predniSONE 5 MG tablet  Commonly known as:  DELTASONE  Take 5 mg by mouth daily.     rifaximin 550 MG Tabs  Commonly known as:  XIFAXAN  Take 1 tablet (550 mg total) by mouth 2 (two) times daily.     spironolactone 50 MG tablet  Commonly known as:  ALDACTONE  Take 50 mg by mouth daily.     vitamin B-1 250 MG tablet  Take 125 mg by mouth daily.     vitamin C 500 MG tablet  Commonly known as:  ASCORBIC ACID  Take 500 mg by mouth daily.        VVS, Skin clean, dry and intact without evidence of skin break down, no evidence of skin tears noted. IV catheter discontinued intact. Site without signs and symptoms of complications. Dressing and pressure applied.  An After Visit Summary was printed and given to the patient. Follow up appointments , new prescriptions and medication administration times given. Patient education and handout given on cellulitis. Patient's wife took home print out on aromatherapy and lavender compresses for cellulitis treatment. Explained to patient's wife how to apply lavender compresses to cellulitis on legs. Patient escorted via WC, and D/C home via private auto.  Cindra Eves, RN 06/25/2012 1:04 PM

## 2012-06-25 NOTE — Progress Notes (Signed)
Pharmacy: Vancomycin (LE Cellulitis)  Vancomycin trough this morning = 40.3 which is supratherapeutic (goal 10-15), but level drawn 30 minutes after dose was hung so likely false. Per progression rounds, patient is going home today so will not re-order another trough.   Louie Casa, PharmD, BCPS 06/25/12, 10:52 AM

## 2012-06-27 ENCOUNTER — Encounter: Payer: Self-pay | Admitting: Infectious Diseases

## 2012-06-27 ENCOUNTER — Telehealth: Payer: Self-pay | Admitting: Endocrinology

## 2012-06-27 ENCOUNTER — Telehealth: Payer: Self-pay | Admitting: Internal Medicine

## 2012-06-27 ENCOUNTER — Ambulatory Visit (INDEPENDENT_AMBULATORY_CARE_PROVIDER_SITE_OTHER): Payer: Medicare Other | Admitting: Infectious Diseases

## 2012-06-27 ENCOUNTER — Other Ambulatory Visit: Payer: Self-pay

## 2012-06-27 VITALS — BP 155/54 | HR 64 | Temp 98.6°F | Ht 76.0 in | Wt 268.0 lb

## 2012-06-27 DIAGNOSIS — K219 Gastro-esophageal reflux disease without esophagitis: Secondary | ICD-10-CM

## 2012-06-27 DIAGNOSIS — Z8679 Personal history of other diseases of the circulatory system: Secondary | ICD-10-CM

## 2012-06-27 DIAGNOSIS — I509 Heart failure, unspecified: Secondary | ICD-10-CM

## 2012-06-27 DIAGNOSIS — E1165 Type 2 diabetes mellitus with hyperglycemia: Secondary | ICD-10-CM | POA: Insufficient documentation

## 2012-06-27 DIAGNOSIS — E119 Type 2 diabetes mellitus without complications: Secondary | ICD-10-CM

## 2012-06-27 DIAGNOSIS — E118 Type 2 diabetes mellitus with unspecified complications: Secondary | ICD-10-CM

## 2012-06-27 DIAGNOSIS — IMO0002 Reserved for concepts with insufficient information to code with codable children: Secondary | ICD-10-CM | POA: Insufficient documentation

## 2012-06-27 DIAGNOSIS — L03115 Cellulitis of right lower limb: Secondary | ICD-10-CM

## 2012-06-27 DIAGNOSIS — L02419 Cutaneous abscess of limb, unspecified: Secondary | ICD-10-CM

## 2012-06-27 LAB — CULTURE, BLOOD (ROUTINE X 2): Culture: NO GROWTH

## 2012-06-27 MED ORDER — ISOSORBIDE MONONITRATE ER 60 MG PO TB24: 60.0000 mg | ORAL_TABLET | Freq: Every day | ORAL | Status: DC

## 2012-06-27 NOTE — Assessment & Plan Note (Signed)
Needs to f/u with his PCP

## 2012-06-27 NOTE — Assessment & Plan Note (Signed)
Will try to get him in with Dr Everardo All ASAP. His wife is attempting to moderate his insulin dosing.

## 2012-06-27 NOTE — Assessment & Plan Note (Signed)
He has f/u appt with Dr Leone Payor this week. This may improve when he comes off doxy.

## 2012-06-27 NOTE — Telephone Encounter (Signed)
Ov today.

## 2012-06-27 NOTE — Telephone Encounter (Signed)
Dr. Everardo All said ov tomorrow

## 2012-06-27 NOTE — Progress Notes (Signed)
  Subjective:    Patient ID: Lucie Leather, male    DOB: [birth year], 77 y.o.   MRN: [id number]  HPI 77 yo M with hx of DM2, CABG (RLE donor site), recurrent LE cullulitis, and alcoholic chirrhosis s/p tips.  Had 4 episodes of LE cellulitis 2010. He was seen in ID clinic for f/u and was started on oral Pen VK for prophylaxis of his recurrent episodes of cellulitis. He was given topical rx for his onychomycosis (terbinafine too high risk given his cirrhosis).  He was restarted on his PEN prophylaxis at his previous f/u in March. Was admitted in April 4-4 to 4-6 for fluid overload. He was again admitted 5-1 to 5-5 with RLE cellulitis (tx with vanco/zosyn, d/c on keflex/doxy x 7 days). His BCx were (-).  Feels like the swelling in his feet is worsening since d/c. Feels like redness is better. Still taking keflex/doxy. Felt like wound care f/u was very helpful until they released him. Has been trying to get home compression machine. Is able to keep his leg up when he is at home ("with a lot of nagging" per his wife) has had FSG 35 and 39 in the midnight to 3AM. Gets confused, improves with soda, candy. Wife cut back his novolg 60 to 30 last night but still had episode. Has previously seen Dr Everardo All.    Review of Systems     Objective:   Physical Exam  Constitutional: He appears well-developed and well-nourished. No distress.  Musculoskeletal:       Legs:         Assessment & Plan:

## 2012-06-27 NOTE — Assessment & Plan Note (Signed)
Will cont his keflex/doxy to Monday, then back onto PEN VK. He will be sent back to the wound care center. He is on the wating list for "lymphedema therapy". Will see him back in ID in 6 weeks.

## 2012-06-27 NOTE — Telephone Encounter (Signed)
Patient is having dysphagia to solids and pills.  Has had a few vomiting episodes due to food getting stuck.  Ok for repeat direct EGD dil?

## 2012-06-28 ENCOUNTER — Ambulatory Visit (INDEPENDENT_AMBULATORY_CARE_PROVIDER_SITE_OTHER): Payer: Medicare Other | Admitting: Endocrinology

## 2012-06-28 ENCOUNTER — Encounter: Payer: Self-pay | Admitting: Endocrinology

## 2012-06-28 VITALS — BP 140/74 | HR 80 | Ht 75.0 in | Wt 266.0 lb

## 2012-06-28 DIAGNOSIS — E1151 Type 2 diabetes mellitus with diabetic peripheral angiopathy without gangrene: Secondary | ICD-10-CM

## 2012-06-28 DIAGNOSIS — I798 Other disorders of arteries, arterioles and capillaries in diseases classified elsewhere: Secondary | ICD-10-CM

## 2012-06-28 DIAGNOSIS — E119 Type 2 diabetes mellitus without complications: Secondary | ICD-10-CM

## 2012-06-28 DIAGNOSIS — E1159 Type 2 diabetes mellitus with other circulatory complications: Secondary | ICD-10-CM

## 2012-06-28 NOTE — Telephone Encounter (Signed)
Patient has until Monday to take doxycycline.  I have set him up an appt for 07/06/12.  His wife will call me back if his symptoms worsen.  He is on a soft diet and they are crushing all meds they can.

## 2012-06-28 NOTE — Telephone Encounter (Signed)
Very likely from doxycycline Would like to know how much longer he has with that Best that he is seen by me or NP/PA - likely needs supportive care and diet modification

## 2012-06-28 NOTE — Progress Notes (Signed)
Subjective:    Patient ID: [name], male    DOB: 12-19-1935, 77 y.o.   MRN: [id number]  HPI Pt returns for f/u of insulin-requiring DM (dx'ed 1992; complicated by peripheral sensory neuropathy, PAD, and CAD).  pt states he feels well in general.  he brings a record of his cbg's which i have reviewed today.  It varies from 35-358, but most are in the 100's.  It is lowest in the middle of the night.  He was recently d/c'ed from the hospital.   Past Medical History  Diagnosis Date  . Diverticulosis   . Gastric antral vascular ectasia   . Rhinophyma   . Melanoma     resected by [facility] 2008  . CAD (coronary artery disease)     myoview 2007 no ischemia/ no ASA because of GI disease  . Atrial fibrillation     amiodarone in past, but problems and left off meds.  . Aortic stenosis     echo, September, 2010, mild aortic insufficiency  . Visual loss, one eye     right eye-ophthalmic arterial branch occlusion  . Internal hemorrhoids   . GERD (gastroesophageal reflux disease)   . Hypertension   . Allergic rhinitis   . Anemia     multifactorial  . Cirrhosis, alcoholic   . Thrombocytopenia   . Bleeding esophageal varices   . Encephalopathy, unspecified   . Gout   . Short-segment Barrett's esophagus     suspected  . Venous insufficiency   . Ejection fraction     EF 55%, echo, September, 2010  . Carotid bruit     Dopplers okay in the past  . Skin cancer     Basal cell and squamous cell  . Cellulitis     recurrent to RLE/notes 06/21/2012  . CHF (congestive heart failure)   . Sinus bradycardia     January, 2014  . Diastolic CHF     January, 2014  . Complication of anesthesia     "quit breathing once a long time ago; dropped my BP too; it was after I was given Dilaudid" (06/21/2012)  . Heart murmur   . Anginal pain   . Shortness of breath     "stay that way most of the time now" (06/21/2012)  . OSA on CPAP   . Diabetes mellitus, type 2   . History of blood transfusion     "not  related to OR" (06/21/2012)  . H/O hiatal hernia   . Osteoarthritis     "ALL OVER" (06/21/2012)  . Chronic lower back pain     Past Surgical History  Procedure Laterality Date  . Total knee arthroplasty Right   . Esophagogastroduodenoscopy  04/10/2008    esophageal varices, cardia varix  . Tips procedure  2/10  . Back surgery      X5 "for ruptured disc; last time did a fusion" (06/21/2012)  . Excisional hemorrhoidectomy    . Release scar contracture / graft repairs of hand      bilaterally   . Elbow surgery Right   . Hiatal hernia repair    . Colonoscopy w/ polypectomy  02/14/2007    adenomatous polyps, diverticulosis, internal hemorrhoids/rectal varices  . Cataract extraction, bilateral  January 2013  . Tonsillectomy    . Appendectomy    . Joint replacement    . Coronary artery bypass graft  1993    6 Bypass  . Cataract extraction w/ intraocular lens  implant, bilateral  History   Social History  . Marital Status: Married    Spouse Name: N/A    Number of Children: N/A  . Years of Education: N/A   Occupational History  .      retired   Social History Main Topics  . Smoking status: Former Smoker -- 2.00 packs/day for 20 years    Types: Cigarettes    Quit date: 02/22/1975  . Smokeless tobacco: Never Used  . Alcohol Use: 0.0 oz/week     Comment: 06/21/2012 "none since Feb 2010"  . Drug Use: No  . Sexually Active: No   Other Topics Concern  . Not on file   Social History Narrative   Patient does not get regular exercise   Quit smoking over 40 years ago    Current Outpatient Prescriptions on File Prior to Visit  Medication Sig Dispense Refill  . allopurinol (ZYLOPRIM) 300 MG tablet Take 150 mg by mouth daily.      . Ascorbic Acid (VITAMIN C) 500 MG tablet Take 500 mg by mouth daily.        . Calcium-Magnesium-Zinc (CALCIUM/MAGNESIUM/ZINC FORMULA) 1000-500-50 MG TABS Take 1 tablet by mouth daily.        . cephALEXin (KEFLEX) 500 MG capsule Take 1 capsule (500 mg  total) by mouth 2 (two) times daily.  14 capsule  0  . doxycycline (VIBRA-TABS) 100 MG tablet Take 1 tablet (100 mg total) by mouth 2 (two) times daily.  14 tablet  0  . fentaNYL (DURAGESIC - DOSED MCG/HR) 50 MCG/HR Place 1 patch onto the skin every 3 (three) days.      . ferrous sulfate 325 (65 FE) MG tablet Take 325 mg by mouth 2 (two) times daily.        . furosemide (LASIX) 40 MG tablet Take 1 tablet (40 mg total) by mouth 2 (two) times daily.  135 tablet  3  . gabapentin (NEURONTIN) 300 MG capsule Take 300 mg by mouth 2 (two) times daily.       Marland Kitchen glucose blood (FREESTYLE LITE) test strip 1 each by Other route 2 (two) times daily. And lancets 250.01  100 each  5  . insulin aspart (NOVOLOG) 100 UNIT/ML injection 30 units with breakfast, 30 units with lunch and 50 units with the evening meal.      . isosorbide mononitrate (IMDUR) 60 MG 24 hr tablet Take 1 tablet (60 mg total) by mouth daily.  90 tablet  0  . lactulose (CHRONULAC) 10 GM/15ML solution Take 20 g by mouth 2 (two) times daily.      Marland Kitchen loratadine (CLARITIN) 10 MG tablet Take 10 mg by mouth daily.      . metoprolol tartrate (LOPRESSOR) 25 MG tablet Take 12.5 mg by mouth 2 (two) times daily.      . nitroGLYCERIN (NITROSTAT) 0.4 MG SL tablet Place 1 tablet (0.4 mg total) under the tongue every 5 (five) minutes as needed for chest pain.  20 tablet  0  . omeprazole (PRILOSEC) 20 MG capsule Take 20 mg by mouth daily.        . polyethylene glycol (MIRALAX / GLYCOLAX) packet Take 17 g by mouth daily as needed. Take two or three times a week      . predniSONE (DELTASONE) 5 MG tablet Take 5 mg by mouth daily.        . rifaximin (XIFAXAN) 550 MG TABS Take 1 tablet (550 mg total) by mouth 2 (two) times daily.  180 tablet  3  . spironolactone (ALDACTONE) 50 MG tablet Take 50 mg by mouth daily.      . Thiamine HCl (VITAMIN B-1) 250 MG tablet Take 125 mg by mouth daily.        No current facility-administered medications on file prior to visit.     Allergies  Allergen Reactions  . Shellfish-Derived Products     Rash & angioedema  . Amoxicillin-Pot Clavulanate     Unsure what reaction was but pt tolerated penicillin 05/25/2012  . Aspirin     REACTION: unable to tolerate due to cirrhosis and varices  . Buprenorphine Hcl-Naloxone Hcl Other (See Comments)    unresponsive  . Codeine     Rash    . Ezetimibe     REACTION: LEG/JOINT PAIN  . Hydromorphone Hcl     unknown  . Meperidine Hcl     unknown  . Morphine     unknown  . Morphine Sulfate     REACTION: unspecified  . Oxycodone     hallucinations  . Sulfonamide Derivatives     Family History  Problem Relation Age of Onset  . Alzheimer's disease Mother   . Cancer Father     GI (stomach?)  . Heart disease Sister     2 sisters  . Liver disease Sister   . Diabetes Brother   . Heart disease Brother     3 brothers  . Liver disease Brother   . Heart attack Other     sibling  . Melanoma Other     sibling  . Stroke Other     sibling    BP 140/74  Pulse 80  Ht 6\' 3"  (1.905 m)  Wt 266 lb (120.657 kg)  BMI 33.25 kg/m2  SpO2 98%  Review of Systems Denies LOC    Objective:   Physical Exam VITAL SIGNS:  See vs page GENERAL: no distress       Assessment & Plan:  DM: overcontrolled.

## 2012-06-28 NOTE — Patient Instructions (Addendum)
blood tests are being requested for you today.  We'll contact you with results. Please stop the lantus altogether. Please reduce the novolog to 30 units with breakfast, 30 units with lunch and 50 units with the evening meal. check your blood sugar 2 times a day.  vary the time of day when you check, between before the 3 meals, and at bedtime.  also check if you have symptoms of your blood sugar being too high or too low.  please keep a record of the readings and bring it to your next appointment here.  please call us sooner if you are having low blood sugar episodes.  It is very important to vary the time of day when you check, including at bedtime.   Please come back for a follow-up appointment in 3 months.

## 2012-06-29 ENCOUNTER — Ambulatory Visit: Payer: Medicare Other | Admitting: Internal Medicine

## 2012-06-29 NOTE — Telephone Encounter (Signed)
OK 

## 2012-07-02 ENCOUNTER — Encounter (HOSPITAL_BASED_OUTPATIENT_CLINIC_OR_DEPARTMENT_OTHER): Payer: Medicare Other | Attending: Internal Medicine

## 2012-07-02 ENCOUNTER — Other Ambulatory Visit: Payer: Self-pay | Admitting: Internal Medicine

## 2012-07-02 DIAGNOSIS — I872 Venous insufficiency (chronic) (peripheral): Secondary | ICD-10-CM | POA: Insufficient documentation

## 2012-07-02 DIAGNOSIS — I890 Lymphedema, not elsewhere classified: Secondary | ICD-10-CM | POA: Insufficient documentation

## 2012-07-05 ENCOUNTER — Ambulatory Visit: Payer: Medicare Other | Admitting: Endocrinology

## 2012-07-06 ENCOUNTER — Encounter: Payer: Self-pay | Admitting: Internal Medicine

## 2012-07-06 ENCOUNTER — Ambulatory Visit (INDEPENDENT_AMBULATORY_CARE_PROVIDER_SITE_OTHER): Payer: Medicare Other | Admitting: Internal Medicine

## 2012-07-06 VITALS — BP 138/66 | HR 67 | Ht 75.0 in | Wt 262.6 lb

## 2012-07-06 DIAGNOSIS — R609 Edema, unspecified: Secondary | ICD-10-CM

## 2012-07-06 DIAGNOSIS — K7030 Alcoholic cirrhosis of liver without ascites: Secondary | ICD-10-CM

## 2012-07-06 DIAGNOSIS — R1310 Dysphagia, unspecified: Secondary | ICD-10-CM

## 2012-07-06 NOTE — Patient Instructions (Addendum)
Please come back and see [place] in four months or sooner if needed.   Thank you for choosing me and [facility].  Iva Boop, M.D., [facility]

## 2012-07-06 NOTE — Progress Notes (Signed)
  Subjective:    Patient ID: [name], male    DOB: [birth year], 77 y.o.   MRN: [id number]  HPI Here for f/u due to dysphagia. He has had recurrent cellulitis x2 with admissions x 2 (April and May). Has been on doxycycline. Developed odynophagia and dysphagia. This is much better since stopping the doxycycline and seems almost resolved. Tolerating 40 mg bid furosemide, legs less edematous. To get compressive therapy.  Medications, allergies, past medical history, past surgical history, family history and social history are reviewed and updated in the EMR.  Review of Systems Fell in shop last week, left knee, left elbow and forearm bruises, soft tissue changes    Objective:   Physical Exam General:  NAD, chronically ill Eyes:   anicteric Lungs:  clear Heart:  S1S2 3/6 systolic murmur Abdomen:  soft and nontender, BS+ Ext:   R LE  Edema 2+, L LE edema 1+ compression hose on  Data Reviewed:  hospitalizations April and May 2014   Chemistry      Component Value Date/Time   NA 135 06/25/2012 0556   K 3.7 06/25/2012 0556   CL 98 06/25/2012 0556   CO2 28 06/25/2012 0556   BUN 20 06/25/2012 0556   CREATININE 1.21 06/25/2012 0556      Component Value Date/Time   CALCIUM 8.9 06/25/2012 0556   ALKPHOS 48 06/22/2012 0520   AST 20 06/22/2012 0520   ALT 9 06/22/2012 0520   BILITOT 1.8* 06/22/2012 0520          Assessment & Plan:  Dysphagia Cirrhosis, alcoholic Edema  1. I think he has had pill ulceration (has had in past) and is getting better. Doxycycline is a classic culprit 2. Cirrhosis is stable 3. Edema is better - continue current dose of diuretics 4. Rev routine 4 months

## 2012-07-13 ENCOUNTER — Ambulatory Visit: Payer: Medicare Other | Admitting: Internal Medicine

## 2012-07-17 ENCOUNTER — Other Ambulatory Visit (HOSPITAL_COMMUNITY): Payer: Self-pay | Admitting: Interventional Radiology

## 2012-07-17 DIAGNOSIS — Z95828 Presence of other vascular implants and grafts: Secondary | ICD-10-CM

## 2012-07-18 ENCOUNTER — Telehealth: Payer: Self-pay

## 2012-07-18 DIAGNOSIS — Z95828 Presence of other vascular implants and grafts: Secondary | ICD-10-CM

## 2012-07-18 NOTE — Telephone Encounter (Signed)
Message copied by Annett Fabian on Wed Jul 18, 2012  9:14 AM ------      Message from: Stan Head E      Created: Wed Jul 18, 2012  8:34 AM       Please send the orders            Thanks      ----- Message -----         From: Alden Server, RN         Sent: 07/17/2012   2:51 PM           To: Jari Sportsman, EMT, Iva Boop, MD            Dr. Leone Payor,            [name] is due for his annual follow up TIPS ultrasound and office visit with Dr. Oley Balm.              Due to insurance regulations, our radiologists are not able to order follow up imaging at one of our outpatient facilities.  For patient convenience, we prefer to schedule his ultrasound and follow up on the same day and same facility.              Please place an EPIC order for:            [place] Art/Ven Flow Abd Pelv Doppler             Department:  [facility]            Thank you,      Amado Nash, RN      [facility]      [facility] [place]      [phone]      [phone]       ------

## 2012-07-20 ENCOUNTER — Ambulatory Visit (HOSPITAL_COMMUNITY)
Admission: RE | Admit: 2012-07-20 | Discharge: 2012-07-20 | Disposition: A | Discharge status: Home / Self Care | Payer: Medicare Other | Source: Ambulatory Visit | Attending: Internal Medicine | Admitting: Internal Medicine

## 2012-07-20 DIAGNOSIS — IMO0001 Reserved for inherently not codable concepts without codable children: Secondary | ICD-10-CM | POA: Insufficient documentation

## 2012-07-20 DIAGNOSIS — I890 Lymphedema, not elsewhere classified: Secondary | ICD-10-CM | POA: Insufficient documentation

## 2012-07-20 DIAGNOSIS — E119 Type 2 diabetes mellitus without complications: Secondary | ICD-10-CM | POA: Insufficient documentation

## 2012-07-20 DIAGNOSIS — I10 Essential (primary) hypertension: Secondary | ICD-10-CM | POA: Insufficient documentation

## 2012-07-20 DIAGNOSIS — I878 Other specified disorders of veins: Secondary | ICD-10-CM | POA: Insufficient documentation

## 2012-07-20 NOTE — Evaluation (Signed)
Physical Therapy Evaluation  Patient Details  Name: [name] MRN: [id number] Date of Birth: [birth year]  Today's Date: 07/20/2012 Time: 1010-1105 PT Time Calculation (min): 55 min              Visit#: 1 of 12  Re-eval: 08/19/12 Assessment Diagnosis: R lymphedema  Prior Therapy: Exercise.  Authorization: medicare     Past Medical History:  Past Medical History  Diagnosis Date  . Diverticulosis   . Gastric antral vascular ectasia   . Rhinophyma   . Melanoma     resected by [facility] 2008  . CAD (coronary artery disease)     myoview 2007 no ischemia/ no ASA because of GI disease  . Atrial fibrillation     amiodarone in past, but problems and left off meds.  . Aortic stenosis     echo, September, 2010, mild aortic insufficiency  . Visual loss, one eye     right eye-ophthalmic arterial branch occlusion  . Internal hemorrhoids   . GERD (gastroesophageal reflux disease)   . Hypertension   . Allergic rhinitis   . Anemia     multifactorial  . Cirrhosis, alcoholic   . Thrombocytopenia   . Bleeding esophageal varices   . Encephalopathy, unspecified   . Gout   . Short-segment Barrett's esophagus     suspected  . Venous insufficiency   . Ejection fraction     EF 55%, echo, September, 2010  . Carotid bruit     Dopplers okay in the past  . Skin cancer     Basal cell and squamous cell  . Cellulitis     recurrent to RLE/notes 06/21/2012  . CHF (congestive heart failure)   . Sinus bradycardia     January, 2014  . Diastolic CHF     January, 2014  . Complication of anesthesia     "quit breathing once a long time ago; dropped my BP too; it was after I was given Dilaudid" (06/21/2012)  . Heart murmur   . Anginal pain   . Shortness of breath     "stay that way most of the time now" (06/21/2012)  . OSA on CPAP   . Diabetes mellitus, type 2   . History of blood transfusion     "not related to OR" (06/21/2012)  . H/O hiatal hernia   . Osteoarthritis     "ALL OVER" (06/21/2012)  .  Chronic lower back pain    Past Surgical History:  Past Surgical History  Procedure Laterality Date  . Total knee arthroplasty Right   . Esophagogastroduodenoscopy  04/10/2008    esophageal varices, cardia varix  . Tips procedure  2/10  . Back surgery      X5 "for ruptured disc; last time did a fusion" (06/21/2012)  . Excisional hemorrhoidectomy    . Release scar contracture / graft repairs of hand      bilaterally   . Elbow surgery Right   . Hiatal hernia repair    . Colonoscopy w/ polypectomy  02/14/2007    adenomatous polyps, diverticulosis, internal hemorrhoids/rectal varices  . Cataract extraction, bilateral  January 2013  . Tonsillectomy    . Appendectomy    . Joint replacement    . Coronary artery bypass graft  1993    6 Bypass  . Cataract extraction w/ intraocular lens  implant, bilateral      Subjective Symptoms/Limitations Symptoms: [name] states that he has been having difficulty with cellulits for the past five years.  The patient has been on antibiotic treatment twice a day and still has had multiple hospital admissions due to cellulitis.  The  most recent admission  being this month.  After the last admission he was authorized a sequential compression machine for home use and has used it several times since he has been home with good results.  The pateint states that his leg has not been as small as it is today for the past three years.  He has been using compression stockings that are 20-56mmHg and is unable to use a higher compression rate due to his history of CHF.  He states that he has never had any problem with his Lt LE but for the past three months he has noticed that his Lt LE has had some increased swelling as well.  He is now being referred to therapy for Lymphedema. Pertinent History: CABG with B saphenous vein harvesting , CHF,  Pain Assessment Currently in Pain?: Yes Pain Score:   2 (Has been as high as a 10/10)     Cognition/Observation Cognition Overall Cognitive Status: Within Functional Limits for tasks assessed Observation/Other Assessments Observations: PT with B knee high compression socks on with noted swelling Rgreater than L  Other Assessments: 11601.58 L 104000.72  Exercise/Treatments  Manual decongestive techniques routing lymph to Rt arm completed.  Therapist did not route lymph to Lt groin at this time due to slight increased swelling of Lt LE and pt stating that in the past two months his Lt. LE has been bothering him as well.   Physical Therapy Assessment and Plan PT Assessment and Plan Clinical Impression Statement: Pt with chronic lymphedema with complication of cellulitis who will benefit from physical therapy to educate pt and decreasing volume of B LE.   Pt will benefit from skilled therapeutic intervention in order to improve on the following deficits: Pain;Increased edema Rehab Potential: Good PT Frequency: Min 3X/week PT Duration: 4 weeks PT Treatment/Interventions: Manual techniques PT Plan: Pt to recieve decongestive manual techniques as well as wrapping if needed to decrease volumes of fluid in LE.Marland Kitchen   Pt to recieve education on self massage and the importance of compression.    Goals Home Exercise Program Pt will Perform Home Exercise Program: Independently PT Short Term Goals Time to Complete Short Term Goals: 2 weeks PT Short Term Goal 1: Pt to be I in skin care PT Short Term Goal 2: Pt to be able to verbalize precautions for decreasing the riskl of infection.  PT Short Term Goal 3: Reduce volume of edema by  PT Short Term Goal 4: Pt to recognize warninig signs and appropriate response to possible infections. PT Long Term Goals Time to Complete Long Term Goals: 4 weeks PT Long Term Goal 1: vebalize skin car and infection prevention PT Long Term Goal 2: reduce edema by 80% Long Term Goal 3: verbalize the  maintenance phase of treatment. Long Term Goal 4: verbalize  the importance of compression.  Problem List Patient Active Problem List   Diagnosis Date Noted  . Lymphedema of lower extremity 07/20/2012  . Venous stasis of lower extremity 07/20/2012  . DM (diabetes mellitus) with peripheral vascular complication 06/28/2012  . Cellulitis of right lower extremity 06/21/2012  . Congestive heart failure 05/25/2012  . Sinus bradycardia   . Aortic stenosis   . Diastolic CHF   . Edema 12/27/2011  . DM2 (diabetes mellitus, type 2) 08/01/2011  . Unspecified adverse effect of unspecified drug, medicinal and biological substance 06/22/2011  .  Ejection fraction   . Carotid bruit   . CAD (coronary artery disease)   . Atrial fibrillation   . Paresthesia of foot 11/01/2010  . NEUROPATHY 04/29/2010  . FOOT ULCER 04/27/2010  .  ASCITES 10/23/2009  . VENOUS INSUFFICIENCY, CHRONIC 03/17/2009  . ARTERIAL BRANCH OCCLUSION OF RETINA 01/30/2009  . HYPERURICEMIA 12/17/2008  . CORONARY ARTERY BYPASS GRAFT, HX OF 11/12/2008  . LOW BACK PAIN, CHRONIC 08/29/2008  . CIRRHOSIS, ALCOHOLIC 05/21/2008  . Hepatic encephalopathy 05/07/2008  . COLONIC POLYPS, ADENOMATOUS, HX OF 03/19/2007  . THROMBOCYTOPENIA, CHRONIC 12/28/2006  . ALLERGIC RHINITIS 12/05/2006  . GERD 12/05/2006  . OSTEOARTHRITIS 12/05/2006  . SLEEP APNEA 12/05/2006  . HYPERLIPIDEMIA 12/04/2006  . GOUT NOS 12/04/2006  . ANEMIA, IRON DEFICIENCY 12/04/2006  . HYPERTENSION, ESSENTIAL NOS 12/04/2006  . HYPERPLASIA, PRST NOS W/O URINARY OBST/LUTS 12/04/2006    General Behavior During Therapy: [facility] for tasks assessed/performed PT Plan of Care PT Home Exercise Plan: information on lymphedema given to pt  GP Functional Limitation: Other PT primary Other PT Primary Current Status (Z6109): At least 40 percent but less than 60 percent impaired, limited or restricted Other PT Primary Goal Status (U0454): At least 20 percent but less than 40 percent impaired, limited or restricted  Tyiana Hill,CINDY 07/20/2012,  1:22 PM  Physician Documentation Your signature is required to indicate approval of the treatment plan as stated above.  Please sign and either send electronically or make a copy of this report for your files and return this physician signed original.   Please mark one 1.__approve of plan  2. ___approve of plan with the following conditions.   ______________________________                                                          _____________________ Physician Signature                                                                                                             Date

## 2012-07-23 ENCOUNTER — Ambulatory Visit (HOSPITAL_COMMUNITY)
Admission: RE | Admit: 2012-07-23 | Discharge: 2012-07-23 | Disposition: A | Discharge status: Home / Self Care | Payer: Medicare Other | Source: Ambulatory Visit | Attending: Internal Medicine | Admitting: Internal Medicine

## 2012-07-23 DIAGNOSIS — IMO0001 Reserved for inherently not codable concepts without codable children: Secondary | ICD-10-CM | POA: Insufficient documentation

## 2012-07-23 DIAGNOSIS — E119 Type 2 diabetes mellitus without complications: Secondary | ICD-10-CM | POA: Insufficient documentation

## 2012-07-23 DIAGNOSIS — I10 Essential (primary) hypertension: Secondary | ICD-10-CM | POA: Insufficient documentation

## 2012-07-23 DIAGNOSIS — I890 Lymphedema, not elsewhere classified: Secondary | ICD-10-CM | POA: Insufficient documentation

## 2012-07-23 NOTE — Progress Notes (Signed)
Physical Therapy Treatment Patient Details  Name: [name] MRN: [id number] Date of Birth: 09/13/1935  Today's Date: 07/23/2012 Time: 1024-1106 PT Time Calculation (min): 42 min Visit#: 2 of 12  Re-eval: 08/19/12 Authorization: medicare  Charges:  Manual 40'  Subjective: Symptoms/Limitations Symptoms: Pt states he's continuing to use his compression pump at home.  States he is voiding frequently since last visit.  Currently without pain, reports of infection or new concerns.   Objective: Manual Therapy Manual Therapy: Other (comment) Other Manual Therapy: MLD for BLE's with greater focus on Rt. LE.  Fluid routing to B axillary nodes and L inguinal node.  Physical Therapy Assessment and Plan PT Assessment and Plan Clinical Impression Statement: Pt. educated on MLD and lymphedema.  Given written MLD instructions for Rt. LE.  Educated pt. on benefits/precautions with using a compression pump, instructing him to open and clear pathways to axillary and L inguinal node prior and after use.  Discussed with spouse on training to assist with MLD at home.  Pt. encouraged to observe next treatment. PT Plan: Continue MLD to Bilateral LE's.  Train spouse with MLD.     Problem List Patient Active Problem List   Diagnosis Date Noted  . Lymphedema of lower extremity 07/20/2012  . Venous stasis of lower extremity 07/20/2012  . DM (diabetes mellitus) with peripheral vascular complication 06/28/2012  . Cellulitis of right lower extremity 06/21/2012  . Congestive heart failure 05/25/2012  . Sinus bradycardia   . Aortic stenosis   . Diastolic CHF   . Edema 12/27/2011  . DM2 (diabetes mellitus, type 2) 08/01/2011  . Unspecified adverse effect of unspecified drug, medicinal and biological substance 06/22/2011  . Ejection fraction   . Carotid bruit   . CAD (coronary artery disease)   . Atrial fibrillation   . Paresthesia of foot 11/01/2010  . NEUROPATHY 04/29/2010  . FOOT ULCER 04/27/2010   .  ASCITES 10/23/2009  . VENOUS INSUFFICIENCY, CHRONIC 03/17/2009  . ARTERIAL BRANCH OCCLUSION OF RETINA 01/30/2009  . HYPERURICEMIA 12/17/2008  . CORONARY ARTERY BYPASS GRAFT, HX OF 11/12/2008  . LOW BACK PAIN, CHRONIC 08/29/2008  . CIRRHOSIS, ALCOHOLIC 05/21/2008  . Hepatic encephalopathy 05/07/2008  . COLONIC POLYPS, ADENOMATOUS, HX OF 03/19/2007  . THROMBOCYTOPENIA, CHRONIC 12/28/2006  . ALLERGIC RHINITIS 12/05/2006  . GERD 12/05/2006  . OSTEOARTHRITIS 12/05/2006  . SLEEP APNEA 12/05/2006  . HYPERLIPIDEMIA 12/04/2006  . GOUT NOS 12/04/2006  . ANEMIA, IRON DEFICIENCY 12/04/2006  . HYPERTENSION, ESSENTIAL NOS 12/04/2006  . HYPERPLASIA, PRST NOS W/O URINARY OBST/LUTS 12/04/2006    PT Plan of Care PT Home Exercise Plan: MLD sequence instructions given to pt/spouse   Lurena Nida, PTA/CLT 07/23/2012, 12:24 PM

## 2012-07-25 ENCOUNTER — Ambulatory Visit (HOSPITAL_COMMUNITY)
Admission: RE | Admit: 2012-07-25 | Discharge: 2012-07-25 | Disposition: A | Discharge status: Home / Self Care | Payer: Medicare Other | Source: Ambulatory Visit | Attending: Internal Medicine | Admitting: Internal Medicine

## 2012-07-25 NOTE — Progress Notes (Signed)
Physical Therapy Treatment Patient Details  Name: [name] MRN: [id number] Date of Birth: 12-Sep-1935  Today's Date: 07/25/2012 Time: 1020-1102 PT Time Calculation (min): 42 min  Visit#: 3 of 12  Re-eval: 08/19/12 Authorization: medicare  Charges:  Manual 40'    Subjective: Symptoms/Limitations Symptoms: Pt states he can tell a difference in his LE's.  Wife is present today to receive education in manual lymph massage.   Wife reports pt with increased SOB Monday evening. Pain Assessment Currently in Pain?: No/denies   Objective: Manual Therapy Manual Therapy: Other (comment) Other Manual Therapy: MLD for Rt. LE routing lymph fluid to Rt axillary nodes and L inguinal node  Physical Therapy Assessment and Plan PT Assessment and Plan Clinical Impression Statement: Pt. present for demonstration/education on Manual lymph drainage (MLD).  Recommended MLD replace compression pump use at home (instead of doing 2 pump treatments at home).  Taught wife using teach back method.  Pt was able to verbalize sequence and technique.  Pt/spouse also educated with CHF warning signs and precautions with preforming MLD that may exaccerbate CHF.  Pt instructed not to perform pumping or MLD if having increased SOB and contact MD.  Focused session on Rt LE as Lt LE is no longer needing intervention/does not appear to be lymph related as much as venous.    PT Plan: Continue MLD to Rt. LE.  Re-measure next week.    Goals    Problem List Patient Active Problem List   Diagnosis Date Noted  . Lymphedema of lower extremity 07/20/2012  . Venous stasis of lower extremity 07/20/2012  . DM (diabetes mellitus) with peripheral vascular complication 06/28/2012  . Cellulitis of right lower extremity 06/21/2012  . Congestive heart failure 05/25/2012  . Sinus bradycardia   . Aortic stenosis   . Diastolic CHF   . Edema 12/27/2011  . DM2 (diabetes mellitus, type 2) 08/01/2011  . Unspecified adverse effect of  unspecified drug, medicinal and biological substance 06/22/2011  . Ejection fraction   . Carotid bruit   . CAD (coronary artery disease)   . Atrial fibrillation   . Paresthesia of foot 11/01/2010  . NEUROPATHY 04/29/2010  . FOOT ULCER 04/27/2010  .  ASCITES 10/23/2009  . VENOUS INSUFFICIENCY, CHRONIC 03/17/2009  . ARTERIAL BRANCH OCCLUSION OF RETINA 01/30/2009  . HYPERURICEMIA 12/17/2008  . CORONARY ARTERY BYPASS GRAFT, HX OF 11/12/2008  . LOW BACK PAIN, CHRONIC 08/29/2008  . CIRRHOSIS, ALCOHOLIC 05/21/2008  . Hepatic encephalopathy 05/07/2008  . COLONIC POLYPS, ADENOMATOUS, HX OF 03/19/2007  . THROMBOCYTOPENIA, CHRONIC 12/28/2006  . ALLERGIC RHINITIS 12/05/2006  . GERD 12/05/2006  . OSTEOARTHRITIS 12/05/2006  . SLEEP APNEA 12/05/2006  . HYPERLIPIDEMIA 12/04/2006  . GOUT NOS 12/04/2006  . ANEMIA, IRON DEFICIENCY 12/04/2006  . HYPERTENSION, ESSENTIAL NOS 12/04/2006  . HYPERPLASIA, PRST NOS W/O URINARY OBST/LUTS 12/04/2006    PT Plan of Care PT Home Exercise Plan: MLD sequence instructions given to pt/spouse   Lurena Nida, PTA/CLT 07/25/2012, 11:29 AM

## 2012-07-26 ENCOUNTER — Emergency Department: Payer: Self-pay | Admitting: Psychiatry

## 2012-07-27 ENCOUNTER — Ambulatory Visit (HOSPITAL_COMMUNITY)
Admission: RE | Admit: 2012-07-27 | Discharge: 2012-07-27 | Disposition: A | Discharge status: Home / Self Care | Payer: Medicare Other | Source: Ambulatory Visit | Attending: Internal Medicine | Admitting: Internal Medicine

## 2012-07-27 NOTE — Progress Notes (Signed)
Physical Therapy Treatment Patient Details  Name: [name] MRN: [id number] Date of Birth: 1936/02/04  Today's Date: 07/27/2012 Time: 1010-1100 PT Time Calculation (min): 50 min Charge: manual lymph massage from 10:14-11:00 charge manual x 3 Visit#: 4 of 12  Re-eval: 08/19/12    Authorization: medicare  Authorization Time Period:    Authorization Visit#:   of     Subjective: Symptoms/Limitations Symptoms: Pt was cutting wood yesterday and cut his Lt LE requiring 14 stitches in the ER.  Pt was in dependent position for prolong period and has increased swelling. Pertinent History: CABG with B saphenous vein harvesting , CHF,  Pain Assessment Pain Score:   2   Exercise/Treatments   Manual Therapy Other Manual Therapy: Manual lymphedema drainage for Rt LE with routing of lymph fluid to Rt axillary and L inguinal nodes.  Physical Therapy Assessment and Plan PT Assessment and Plan Clinical Impression Statement: Pt session focused on RT LE manual decongestive massage. Pt will benefit from skilled therapeutic intervention in order to improve on the following deficits: Pain;Increased edema Rehab Potential: Good PT Frequency: Min 3X/week PT Duration: 4 weeks PT Treatment/Interventions: Manual techniques PT Plan: Continue MLD to Rt. LE.  Re-measure next week.    Goals  progressing  Problem List Patient Active Problem List   Diagnosis Date Noted  . Lymphedema of lower extremity 07/20/2012  . Venous stasis of lower extremity 07/20/2012  . DM (diabetes mellitus) with peripheral vascular complication 06/28/2012  . Cellulitis of right lower extremity 06/21/2012  . Congestive heart failure 05/25/2012  . Sinus bradycardia   . Aortic stenosis   . Diastolic CHF   . Edema 12/27/2011  . DM2 (diabetes mellitus, type 2) 08/01/2011  . Unspecified adverse effect of unspecified drug, medicinal and biological substance 06/22/2011  . Ejection fraction   . Carotid bruit   . CAD (coronary  artery disease)   . Atrial fibrillation   . Paresthesia of foot 11/01/2010  . NEUROPATHY 04/29/2010  . FOOT ULCER 04/27/2010  .  ASCITES 10/23/2009  . VENOUS INSUFFICIENCY, CHRONIC 03/17/2009  . ARTERIAL BRANCH OCCLUSION OF RETINA 01/30/2009  . HYPERURICEMIA 12/17/2008  . CORONARY ARTERY BYPASS GRAFT, HX OF 11/12/2008  . LOW BACK PAIN, CHRONIC 08/29/2008  . CIRRHOSIS, ALCOHOLIC 05/21/2008  . Hepatic encephalopathy 05/07/2008  . COLONIC POLYPS, ADENOMATOUS, HX OF 03/19/2007  . THROMBOCYTOPENIA, CHRONIC 12/28/2006  . ALLERGIC RHINITIS 12/05/2006  . GERD 12/05/2006  . OSTEOARTHRITIS 12/05/2006  . SLEEP APNEA 12/05/2006  . HYPERLIPIDEMIA 12/04/2006  . GOUT NOS 12/04/2006  . ANEMIA, IRON DEFICIENCY 12/04/2006  . HYPERTENSION, ESSENTIAL NOS 12/04/2006  . HYPERPLASIA, PRST NOS W/O URINARY OBST/LUTS 12/04/2006    General Behavior During Therapy: [facility] for tasks assessed/performed  GP Functional Limitation: Other PT primary  RUSSELL,CINDY 07/27/2012, 12:18 PM

## 2012-07-30 ENCOUNTER — Ambulatory Visit (HOSPITAL_COMMUNITY)
Admission: RE | Admit: 2012-07-30 | Discharge: 2012-07-30 | Disposition: A | Discharge status: Home / Self Care | Payer: Medicare Other | Source: Ambulatory Visit | Attending: Physical Therapy | Admitting: Physical Therapy

## 2012-07-30 DIAGNOSIS — I890 Lymphedema, not elsewhere classified: Secondary | ICD-10-CM

## 2012-07-30 DIAGNOSIS — I878 Other specified disorders of veins: Secondary | ICD-10-CM

## 2012-07-30 NOTE — Progress Notes (Signed)
Physical Therapy Treatment Patient Details  Name: [name] MRN: [id number] Date of Birth: 08/10/1935  Today's Date: 07/30/2012 Time: 4098-1191 PT Time Calculation (min): 45 min  Visit#: 5 of 12  Re-eval: 08/19/12  charge:  Manual [id number]; manual  X 3.  Authorization: medicare  Authorization Time Period:    Authorization Visit#: 5 of 10   Subjective: Symptoms/Limitations Symptoms: Wife states they have been using the compression pump and she has been trying to complete the decongestive techniques as well  Pain Assessment Currently in Pain?: Yes (from cut that occured last week.) Pain Score:   3 Pain Orientation: Left  Exercise/Treatments  Manual Therapy Other Manual Therapy: manual lymphedema drainage for Rt LE with routinhg of lymph fluid to Rt axsillary and L incuinal nodes.  PT wrapped with short stretch bandage from MTP to knee.  Physical Therapy Assessment and Plan PT Assessment and Plan Clinical Impression Statement: Pt remeasured and has actually increased in fluid 804 cc.    It is important to note that the evaluation occured right after the pt had been in the hospital for several days and had stated that his leg had never been smaller.  Pt will need bandaging with short stretch bandage to keep lymphedema under controll.  Would recommend thigh high compression hose when pt is discharged. Rehab Potential: Good PT Frequency: Min 3X/week PT Duration: 4 weeks PT Treatment/Interventions: Manual techniques PT Plan: Continue MLD to Rt. LE as well as bandaging.    Goals  not met  Problem List Patient Active Problem List   Diagnosis Date Noted  . Lymphedema of lower extremity 07/20/2012  . Venous stasis of lower extremity 07/20/2012  . DM (diabetes mellitus) with peripheral vascular complication 06/28/2012  . Cellulitis of right lower extremity 06/21/2012  . Congestive heart failure 05/25/2012  . Sinus bradycardia   . Aortic stenosis   . Diastolic CHF   . Edema  12/27/2011  . DM2 (diabetes mellitus, type 2) 08/01/2011  . Unspecified adverse effect of unspecified drug, medicinal and biological substance 06/22/2011  . Ejection fraction   . Carotid bruit   . CAD (coronary artery disease)   . Atrial fibrillation   . Paresthesia of foot 11/01/2010  . NEUROPATHY 04/29/2010  . FOOT ULCER 04/27/2010  .  ASCITES 10/23/2009  . VENOUS INSUFFICIENCY, CHRONIC 03/17/2009  . ARTERIAL BRANCH OCCLUSION OF RETINA 01/30/2009  . HYPERURICEMIA 12/17/2008  . CORONARY ARTERY BYPASS GRAFT, HX OF 11/12/2008  . LOW BACK PAIN, CHRONIC 08/29/2008  . CIRRHOSIS, ALCOHOLIC 05/21/2008  . Hepatic encephalopathy 05/07/2008  . COLONIC POLYPS, ADENOMATOUS, HX OF 03/19/2007  . THROMBOCYTOPENIA, CHRONIC 12/28/2006  . ALLERGIC RHINITIS 12/05/2006  . GERD 12/05/2006  . OSTEOARTHRITIS 12/05/2006  . SLEEP APNEA 12/05/2006  . HYPERLIPIDEMIA 12/04/2006  . GOUT NOS 12/04/2006  . ANEMIA, IRON DEFICIENCY 12/04/2006  . HYPERTENSION, ESSENTIAL NOS 12/04/2006  . HYPERPLASIA, PRST NOS W/O URINARY OBST/LUTS 12/04/2006    General Behavior During Therapy: [facility] for tasks assessed/performed  GP    Sharisse Rantz,CINDY 07/30/2012, 5:21 PM

## 2012-08-01 ENCOUNTER — Ambulatory Visit (HOSPITAL_COMMUNITY)
Admission: RE | Admit: 2012-08-01 | Discharge: 2012-08-01 | Disposition: A | Discharge status: Home / Self Care | Payer: Medicare Other | Source: Ambulatory Visit | Attending: Internal Medicine | Admitting: Internal Medicine

## 2012-08-01 NOTE — Progress Notes (Signed)
Physical Therapy Treatment Patient Details  Name: [name] MRN: [id number] Date of Birth: 22-Sep-1935  Today's Date: 08/01/2012 Time: 6295-2841 PT Time Calculation (min): 42 min  Visit#: 6 of 12  Re-eval: 08/19/12 Authorization: medicare  Authorization Visit#: 6 of 10  Charges:  Manual 40'  Subjective: Symptoms/Limitations Symptoms: Pt states he cut his Lt LE on Friday and was taken to the ED for stiches.  Pt/wife express frustration due to Rt LE not responding as well as hoped.  Discussed bandaging option and agreed to try.  Pain Assessment Currently in Pain?: Yes Pain Score:   2 Pain Orientation: Left Pain Type: Acute pain   Objective: Manual Therapy Other Manual Therapy: manual lymphedema drainage for Rt LE with routinhg of lymph fluid to Rt axillary and L inguinal nodes. Lymphedema bandaging to Rt LE following MLD and application of lotion with foam, isoband, and short stretch bandages (1-6cm, 1-8cm, 1-10cm, 2-12cm)  Physical Therapy Assessment and Plan PT Assessment and Plan Clinical Impression Statement: Foam measured and cut and bandaging completed to Rt LE following Manual lymph drainage and application of lotion.  Pt without complaints of being too tight reporting overall comfort at end of session.  Pt would benefit from a flat knit garment when leg is fully compressed to maintain size.  Pt/wife educated with bandaging technique/purpose.  Pt/wife informed our facitlity provides wound care if MD orders for LLE. Rehab Potential: Good PT Frequency: Min 3X/week PT Duration: 4 weeks PT Treatment/Interventions: Manual techniques PT Plan: Continue complete decongestive therapy.    Problem List Patient Active Problem List   Diagnosis Date Noted  . Lymphedema of lower extremity 07/20/2012  . Venous stasis of lower extremity 07/20/2012  . DM (diabetes mellitus) with peripheral vascular complication 06/28/2012  . Cellulitis of right lower extremity 06/21/2012  . Congestive  heart failure 05/25/2012  . Sinus bradycardia   . Aortic stenosis   . Diastolic CHF   . Edema 12/27/2011  . DM2 (diabetes mellitus, type 2) 08/01/2011  . Unspecified adverse effect of unspecified drug, medicinal and biological substance 06/22/2011  . Ejection fraction   . Carotid bruit   . CAD (coronary artery disease)   . Atrial fibrillation   . Paresthesia of foot 11/01/2010  . NEUROPATHY 04/29/2010  . FOOT ULCER 04/27/2010  .  ASCITES 10/23/2009  . VENOUS INSUFFICIENCY, CHRONIC 03/17/2009  . ARTERIAL BRANCH OCCLUSION OF RETINA 01/30/2009  . HYPERURICEMIA 12/17/2008  . CORONARY ARTERY BYPASS GRAFT, HX OF 11/12/2008  . LOW BACK PAIN, CHRONIC 08/29/2008  . CIRRHOSIS, ALCOHOLIC 05/21/2008  . Hepatic encephalopathy 05/07/2008  . COLONIC POLYPS, ADENOMATOUS, HX OF 03/19/2007  . THROMBOCYTOPENIA, CHRONIC 12/28/2006  . ALLERGIC RHINITIS 12/05/2006  . GERD 12/05/2006  . OSTEOARTHRITIS 12/05/2006  . SLEEP APNEA 12/05/2006  . HYPERLIPIDEMIA 12/04/2006  . GOUT NOS 12/04/2006  . ANEMIA, IRON DEFICIENCY 12/04/2006  . HYPERTENSION, ESSENTIAL NOS 12/04/2006  . HYPERPLASIA, PRST NOS W/O URINARY OBST/LUTS 12/04/2006    General Behavior During Therapy: [facility] for tasks assessed/performed   Lurena Nida, PTA/CLT 08/01/2012, 2:33 PM

## 2012-08-06 ENCOUNTER — Ambulatory Visit (HOSPITAL_COMMUNITY)
Admission: RE | Admit: 2012-08-06 | Discharge: 2012-08-06 | Disposition: A | Discharge status: Home / Self Care | Payer: Medicare Other | Source: Ambulatory Visit | Attending: Internal Medicine | Admitting: Internal Medicine

## 2012-08-06 NOTE — Progress Notes (Signed)
Physical Therapy Treatment Patient Details  Name: [name] MRN: [id number] Date of Birth: 09/22/1935  Today's Date: 08/06/2012 Time: 4098-1191 PT Time Calculation (min): 47 min  Visit#: 7 of 12  Re-eval: 08/19/12 Authorization: medicare  Authorization Visit#: 7 of 10  Charges:  Manual 44'  Subjective: Symptoms/Limitations Symptoms: Wife/pt. state he was unable to keep bandage on but 8 hours due to increased discomfort over dorsal aspect of foot.  States the pump is helping him the most and feels his swelling is down a great deal today. Pain Assessment Currently in Pain?: No/denies   Objective: Manual Therapy Other Manual Therapy: manual lymphedema drainage for Rt LE with routinhg of lymph fluid to Rt axillary and L inguinal nodes. Lymphedema bandaging to Rt LE following MLD and application of lotion with foam, isoband, and short stretch bandages (1-6cm, 1-8cm, 1-10cm, 2-12cm)  Physical Therapy Assessment and Plan PT Assessment and Plan Clinical Impression Statement: Pt. agreeable to try bandaging again today; insured padding was covering entire dorsum of foot.  MLD peformed with good results/bandaging completed with instructions to remove if becomes uncomfortable or if changes in integrity of LE. PT Treatment/Interventions: Manual techniques PT Plan: Continue complete decongestive therapy.     Problem List Patient Active Problem List   Diagnosis Date Noted  . Lymphedema of lower extremity 07/20/2012  . Venous stasis of lower extremity 07/20/2012  . DM (diabetes mellitus) with peripheral vascular complication 06/28/2012  . Cellulitis of right lower extremity 06/21/2012  . Congestive heart failure 05/25/2012  . Sinus bradycardia   . Aortic stenosis   . Diastolic CHF   . Edema 12/27/2011  . DM2 (diabetes mellitus, type 2) 08/01/2011  . Unspecified adverse effect of unspecified drug, medicinal and biological substance 06/22/2011  . Ejection fraction   . Carotid bruit    . CAD (coronary artery disease)   . Atrial fibrillation   . Paresthesia of foot 11/01/2010  . NEUROPATHY 04/29/2010  . FOOT ULCER 04/27/2010  .  ASCITES 10/23/2009  . VENOUS INSUFFICIENCY, CHRONIC 03/17/2009  . ARTERIAL BRANCH OCCLUSION OF RETINA 01/30/2009  . HYPERURICEMIA 12/17/2008  . CORONARY ARTERY BYPASS GRAFT, HX OF 11/12/2008  . LOW BACK PAIN, CHRONIC 08/29/2008  . CIRRHOSIS, ALCOHOLIC 05/21/2008  . Hepatic encephalopathy 05/07/2008  . COLONIC POLYPS, ADENOMATOUS, HX OF 03/19/2007  . THROMBOCYTOPENIA, CHRONIC 12/28/2006  . ALLERGIC RHINITIS 12/05/2006  . GERD 12/05/2006  . OSTEOARTHRITIS 12/05/2006  . SLEEP APNEA 12/05/2006  . HYPERLIPIDEMIA 12/04/2006  . GOUT NOS 12/04/2006  . ANEMIA, IRON DEFICIENCY 12/04/2006  . HYPERTENSION, ESSENTIAL NOS 12/04/2006  . HYPERPLASIA, PRST NOS W/O URINARY OBST/LUTS 12/04/2006        Lurena Nida, PTA/CLT 08/06/2012, 1:02 PM

## 2012-08-07 ENCOUNTER — Ambulatory Visit (INDEPENDENT_AMBULATORY_CARE_PROVIDER_SITE_OTHER): Payer: Medicare Other | Admitting: Internal Medicine

## 2012-08-07 ENCOUNTER — Encounter: Payer: Self-pay | Admitting: Internal Medicine

## 2012-08-07 VITALS — BP 126/64 | HR 63 | Temp 98.2°F | Wt 258.0 lb

## 2012-08-07 DIAGNOSIS — L97909 Non-pressure chronic ulcer of unspecified part of unspecified lower leg with unspecified severity: Secondary | ICD-10-CM

## 2012-08-07 DIAGNOSIS — L97922 Non-pressure chronic ulcer of unspecified part of left lower leg with fat layer exposed: Secondary | ICD-10-CM

## 2012-08-07 MED ORDER — GABAPENTIN 300 MG PO CAPS: 300.0000 mg | ORAL_CAPSULE | Freq: Three times a day (TID) | ORAL | Status: DC

## 2012-08-07 NOTE — Progress Notes (Signed)
  Subjective:    Patient ID: [name], male    DOB: 06-08-1935, 77 y.o.   MRN: [id number]  HPI  He sustained a laceration  to the left shin from a wood log ;this was sutured 12 days ago in [facility] ER. He is here to have the sutures removed if possible  An associated wound of the shin has been treated with sterile distilled water and Neosporin.    Review of Systems   He has had no associated fever, chills, sweats, purulent drainage.  He has receiving lymphedema therapy from physical therapy for advanced lymphedema of the right lower extremity.  He is receiving a narcotic pain patch from Dr. Ethelene Hal for chronic LBP. He also is on gabapentin 100 mg 2 pills 3 times a day for neuropathic pain. His response has been suboptimal to both     Objective:   Physical Exam  He appears in no acute distress; he is adequately nourished  Pedal pulses appear to be palpable despite the edema and lymphedema present.  The right lower extremity is wrapped from the knee down  There is an irregular ulcer over the left shin which measures 10 cm in length by 3 cm wide. Lateral to this is a 10 cm laceration with associated eschar. There is no purulence present        Assessment & Plan:  #1 post traumatic ulcer , healing w/o cellulitis #2 laceration w/o cellulitis #3 neuropathic pain in feet Plan: see orders

## 2012-08-07 NOTE — Patient Instructions (Addendum)
Dip gauze in  sterile saline and applied to the wound twice a day. Cover the wound with Telfa , non stick dressing  without any antibiotic ointment. The saline can be purchased at the drugstore or you can make your own .Boil cup of salt in a gallon of water. Store mixture  in a clean container.Report Warning  signs as discussed (red streaks, pus, fever, increasing pain). 

## 2012-08-08 ENCOUNTER — Encounter: Payer: Self-pay | Admitting: Infectious Diseases

## 2012-08-08 ENCOUNTER — Ambulatory Visit (INDEPENDENT_AMBULATORY_CARE_PROVIDER_SITE_OTHER): Payer: Medicare Other | Admitting: Infectious Diseases

## 2012-08-08 ENCOUNTER — Ambulatory Visit (HOSPITAL_COMMUNITY)
Admission: RE | Admit: 2012-08-08 | Discharge: 2012-08-08 | Disposition: A | Discharge status: Home / Self Care | Payer: Medicare Other | Source: Ambulatory Visit | Attending: Internal Medicine | Admitting: Internal Medicine

## 2012-08-08 VITALS — BP 137/60 | HR 67 | Temp 98.2°F | Ht 75.0 in | Wt 237.0 lb

## 2012-08-08 DIAGNOSIS — I872 Venous insufficiency (chronic) (peripheral): Secondary | ICD-10-CM

## 2012-08-08 DIAGNOSIS — L03115 Cellulitis of right lower limb: Secondary | ICD-10-CM

## 2012-08-08 DIAGNOSIS — L03119 Cellulitis of unspecified part of limb: Secondary | ICD-10-CM

## 2012-08-08 DIAGNOSIS — S81809A Unspecified open wound, unspecified lower leg, initial encounter: Secondary | ICD-10-CM | POA: Insufficient documentation

## 2012-08-08 DIAGNOSIS — S81009A Unspecified open wound, unspecified knee, initial encounter: Secondary | ICD-10-CM | POA: Insufficient documentation

## 2012-08-08 DIAGNOSIS — IMO0002 Reserved for concepts with insufficient information to code with codable children: Secondary | ICD-10-CM

## 2012-08-08 DIAGNOSIS — S81002S Unspecified open wound, left knee, sequela: Secondary | ICD-10-CM

## 2012-08-08 NOTE — Progress Notes (Signed)
  Subjective:    Patient ID: [name], male    DOB: 1935-06-25, 77 y.o.   MRN: [id number]  HPI 77 yo M with hx of DM2, CABG (RLE donor site), recurrent LE cullulitis, and alcoholic chirrhosis s/p tips.  Had 4 episodes of LE cellulitis 2010. He was seen in ID clinic for f/u and was started on oral Pen VK for prophylaxis of his recurrent episodes of cellulitis. He was given topical rx for his onychomycosis (terbinafine too high risk given his cirrhosis).  He was restarted on his PEN prophylaxis at his previous f/u in March. Was admitted in April 4-4 to 4-6 for fluid overload. He was again admitted 5-1 to 5-5 with RLE cellulitis (tx with vanco/zosyn, d/c on keflex/doxy x 7 days). His BCx were (-). At his previous visit, he had significant problems with hypoglycemia.  Today feels like his RLE is much better- has been been back to hospital twice since last visit- once with CHF and once with "sepsis" from his leg. He has had compression pump set up for his leg at home. He is getting lymphedema therapy at [facility]. Had his leg wrapped 2 days ago. Continues to take Pen VK. FSG continue to have high episodes ("too much ice cream or pineapple cake"). 135, 145 last reading. Has f/u with Dr Everardo All. Has not had low FSG as he had previously.  Had injury to his L leg ~ 2 weeks ago (14 stitches) after a log fell on his leg while he was cutting wood.  Has 31# since May. His lasix has been increased. Also on spironolactone.   Review of Systems  Constitutional: Negative for fever and chills.       Objective:   Physical Exam  Constitutional: He appears well-developed and well-nourished.  Skin:             Assessment & Plan:

## 2012-08-08 NOTE — Evaluation (Signed)
Physical Therapy Re-evaluation / discharge  Patient Details  Name: [name] MRN: [id number] Date of Birth: [birth year]  Today's Date: 08/08/2012 Time: 4098-1191 PT Time Calculation (min): 48 min              Visit#: 9 of 12  Re-eval: 08/19/12 Authorization: medicare    Authorization Visit#: 8 of 10  Charges:  Manual 35', self care/education 10'   Subjective Symptoms/Limitations Symptoms: Wife/pt. state pt will begin woundcare in [place] starting tomorrow.  Pt. was able to keep compression bandaging on RLE and states no discomfort.  States he is feeling much better. Pain Assessment Currently in Pain?: No/denies   Objective: Manual Therapy Other Manual Therapy: manual lymphedema drainage for Rt LE with routinhg of lymph fluid to Rt axillary and L inguinal nodes. measurement of Rt LE completed.   Physical Therapy Assessment and Plan PT Assessment and Plan Clinical Impression Statement: Pt. with overall lymphedema reduction in Rt LE of 227.44cc (50% reduction) since measurement taken on 07/30/12.  Pt/wife agree pt. is at "normal" level and are pleased with overall results and education received with therapy.  Pt. instructed to be fitted with thigh high compression of 30-44mmHg to better decompress LE's.  Spouse given written orders for compression garments as well as educated on clearing axilary and inguinal nodes prior to compression pump therapy.  Patient/spouse also given information on donning aides for garments.  PT Treatment/Interventions: Manual techniques PT Plan: Discharge to home maintenance using compression pump and MLD techniques provided by spouse.  Daily use of compression garments.     Goals Home Exercise Program Pt will Perform Home Exercise Program: Independently PT Goal: Perform Home Exercise Program - Progress: Met PT Short Term Goals Time to Complete Short Term Goals: 2 weeks PT Short Term Goal 1: Pt to be Independent in skin care PT Short Term Goal 1 -  Progress: Met PT Short Term Goal 2: Pt to be able to verbalize precautions for decreasing the risk of infection.  PT Short Term Goal 2 - Progress: Met PT Short Term Goal 3: Reduce volume of edema by 70% PT Short Term Goal 3 - Progress: Not met (reduced by 50%) PT Short Term Goal 4: Pt to recognize warninig signs and appropriate response to possible infections. (reduction of 50%) PT Short Term Goal 4 - Progress: Met PT Long Term Goals Time to Complete Long Term Goals: 4 weeks PT Long Term Goal 1: vebalize skin care and infection prevention PT Long Term Goal 1 - Progress: Met PT Long Term Goal 2: reduce edema by 80% PT Long Term Goal 2 - Progress: Not met Long Term Goal 3: verbalize the  maintenance phase of treatment. Long Term Goal 3 Progress: Met Long Term Goal 4: verbalize the importance of compression. Long Term Goal 4 Progress: Met    General Behavior During Therapy: WFL for tasks assessed/performed Cognition: WFL for tasks performed PT Plan of Care Consulted and Agree with Plan of Care: Patient;Family member/caregiver  GP Functional Assessment Tool Used: clinical judgement; lymph volumes Functional Limitation: Other PT primary Other PT Primary Current Status [phone]): At least 20 percent but less than 40 percent impaired, limited or restricted Other PT Primary Goal Status ([id number]): At least 20 percent but less than 40 percent impaired, limited or restricted Other PT Primary Discharge Status [phone]): At least 20 percent but less than 40 percent impaired, limited or restricted  Lurena Nida, PTA/CLT 08/08/2012, 3:18 PM

## 2012-08-08 NOTE — Assessment & Plan Note (Signed)
His wound is macerated covered. He has good f/u with wound center and Dr Alwyn Ren.

## 2012-08-08 NOTE — Assessment & Plan Note (Signed)
Per he and his wife, his RLE is well healed, there are no open wounds. He will continue the chronic PEN prophylaxis. They feel that the lymphedema therapy he is getting is helping him dramatically. Will see him back in 3 months.

## 2012-08-09 ENCOUNTER — Encounter (HOSPITAL_BASED_OUTPATIENT_CLINIC_OR_DEPARTMENT_OTHER): Payer: Medicare Other | Attending: Internal Medicine

## 2012-08-09 DIAGNOSIS — K7460 Unspecified cirrhosis of liver: Secondary | ICD-10-CM | POA: Insufficient documentation

## 2012-08-09 DIAGNOSIS — S99919A Unspecified injury of unspecified ankle, initial encounter: Secondary | ICD-10-CM | POA: Insufficient documentation

## 2012-08-09 DIAGNOSIS — Z951 Presence of aortocoronary bypass graft: Secondary | ICD-10-CM | POA: Insufficient documentation

## 2012-08-09 DIAGNOSIS — Z85820 Personal history of malignant melanoma of skin: Secondary | ICD-10-CM | POA: Insufficient documentation

## 2012-08-09 DIAGNOSIS — Z96659 Presence of unspecified artificial knee joint: Secondary | ICD-10-CM | POA: Insufficient documentation

## 2012-08-09 DIAGNOSIS — Z794 Long term (current) use of insulin: Secondary | ICD-10-CM | POA: Insufficient documentation

## 2012-08-09 DIAGNOSIS — E119 Type 2 diabetes mellitus without complications: Secondary | ICD-10-CM | POA: Insufficient documentation

## 2012-08-09 DIAGNOSIS — I2510 Atherosclerotic heart disease of native coronary artery without angina pectoris: Secondary | ICD-10-CM | POA: Insufficient documentation

## 2012-08-09 DIAGNOSIS — S8990XA Unspecified injury of unspecified lower leg, initial encounter: Secondary | ICD-10-CM | POA: Insufficient documentation

## 2012-08-09 DIAGNOSIS — Z79899 Other long term (current) drug therapy: Secondary | ICD-10-CM | POA: Insufficient documentation

## 2012-08-09 DIAGNOSIS — X58XXXA Exposure to other specified factors, initial encounter: Secondary | ICD-10-CM | POA: Insufficient documentation

## 2012-08-09 DIAGNOSIS — I87309 Chronic venous hypertension (idiopathic) without complications of unspecified lower extremity: Secondary | ICD-10-CM | POA: Insufficient documentation

## 2012-08-09 NOTE — H&P (Signed)
NAMERASHAD, AULD                   ACCOUNT NO.:  [id number]  MEDICAL RECORD NO.:  [id number]  LOCATION:                                 FACILITY:  PHYSICIAN:  Joanne Gavel, M.D.        DATE OF BIRTH:  December 19, 1935  DATE OF ADMISSION:  08/09/2012 DATE OF DISCHARGE:                             HISTORY & PHYSICAL   CHIEF COMPLAINT:  Wound, left leg.  HISTORY OF PRESENT ILLNESS:  This 77 year old male recently discharged from this clinic for treatment of a chronic venous hypertension with ulcer was injured with a log and band saw 2 weeks ago.  He went to the emergency room where he received 14 stitches.  He comes now for wound care treatment.  PAST MEDICAL HISTORY:  Significant for diabetes type 2, cirrhosis, atrial fibrillation, neuropathy, hypertension, coronary artery disease, congestive heart failure, chronic venous insufficiency, anemia, GERD, osteoarthritis, diverticulosis, and history of melanoma.  PAST SURGICAL HISTORY:  CABG, right total knee replacement, hemorrhoidectomy, inguinal hernia repair, multiple back surgeries, cataract surgery, hand surgery, elbow surgery, and liver stent placement.  MEDICATIONS:  Allopurinol, furosemide, spironolactone, Feosol, diltiazem, metoprolol, isosorbide, omeprazole, lactulose, rifaximin, NovoLog insulin, prednisone, gabapentin, penicillin, fentanyl patch, doxycycline, and Keflex.  SOCIAL HISTORY:  Cigarettes none.  Alcohol none.  ALLERGIES:  Sulfa, Dilaudid, codeine, morphine, shellfish, Demerol, Zetia, Augmentin.  REVIEW OF SYSTEMS:  As above.  PHYSICAL EXAMINATION:  VITAL SIGNS:  Temperature 97.7, pulse 58 and regular at present, respirations 16, blood pressure 149/68.  Blood glucose is 194. GENERAL APPEARANCE:  Well developed, well nourished, in no distress. CHEST:  Clear. HEART:  Regular rhythm at present. ABDOMEN:  Not examined. EXTREMITIES: Examination of the left lower extremity reveals dorsalis pedis pulses palpable.   There is a 10-mm long incision with sutures in place.  This wound appears to be healed.  Medial to the incision, there is an area of abrasion approximately 10 cm long also.  IMPRESSION:  Trauma in an area of probable venous hypertension.  Sutures have been removed.  The wounds appeared to be clean.  We will treat the open areas with collagen and apply Unna boot.  We will see the patient in 7 days.     Joanne Gavel, M.D.     RA/MEDQ  D:  08/09/2012  T:  08/09/2012  Job:  [id number]

## 2012-08-10 ENCOUNTER — Inpatient Hospital Stay (HOSPITAL_COMMUNITY)
Admission: RE | Admit: 2012-08-10 | Discharge: 2012-08-10 | Disposition: A | Discharge status: Home / Self Care | Payer: Medicare Other | Source: Ambulatory Visit | Attending: Physical Therapy | Admitting: Physical Therapy

## 2012-09-04 ENCOUNTER — Ambulatory Visit
Admission: RE | Admit: 2012-09-04 | Discharge: 2012-09-04 | Disposition: A | Discharge status: Home / Self Care | Payer: Medicare Other | Source: Ambulatory Visit | Attending: Interventional Radiology | Admitting: Interventional Radiology

## 2012-09-04 ENCOUNTER — Ambulatory Visit
Admission: RE | Admit: 2012-09-04 | Discharge: 2012-09-04 | Disposition: A | Discharge status: Home / Self Care | Payer: Medicare Other | Source: Ambulatory Visit | Attending: Internal Medicine | Admitting: Internal Medicine

## 2012-09-04 DIAGNOSIS — Z95828 Presence of other vascular implants and grafts: Secondary | ICD-10-CM

## 2012-09-04 NOTE — Progress Notes (Signed)
Recent hospitalizations:  April 2014, CHF;  May 2014  Cellulitis of RLE.   Recent weight loss of 35 lbs after Lasix increased to 40 mg bid.  Appetite:  Fair.  Generally feels uncomfortable while eating and after meals.  Fatigues easily.   ADL;s require frequent rest intervals.  Naps frequently.    [name] Carmell [place], [place] 09/04/2012 8:50 AM

## 2012-09-04 NOTE — Progress Notes (Signed)
Patient ID: [name], male   DOB: 10/24/1935, 77 y.o.   MRN: [id number] September 04, 2012  Stan Head, MD  [facility] [address] [place], [place] [address]  Re:  [name] (DOB: 11/16/2035)  Dear Dr. Leone Payor:  I had the opportunity to see our mutual patient, [name], today at his scheduled follow-up exam, four years status post TIPS creation for acute GI bleed.  Since his previous visit one year ago, he has done well.   No interval GI bleeding.  Hepatic encephalopathy is well controlled by Lactulose, with less of a need for supplementary MiraLax.  His appetite is fair, and he does have some discomfort while eating and post prandial.  He does fatigue easily and his activities of daily living do require frequent rest intervals and naps.  He was hospitalized in April of this year for congestive heart failure, and again in May for right lower extremity cellulitis.  He had a recent weight loss of 35 pounds, temporally associated with an increase in his Lasix to 40 mg twice a day.  On exam, he is of normal mood and affect.  His blood pressure is 141/59, pulse 53, afebrile, respirations 17.  He is 6 feet, 3 inches and 250 pounds.  O2 sats were 99% on room air.  He has compression hose on bilateral lower extremities.   His vascular ultrasound today, reported separately, shows steady flow velocities throughout the portal venous, TIPS and hepatic venous systems.  There is no evidence of stenosis or occlusion.  There is no ascites or other acute findings.  Gallstones are incidentally noted.  My impression is that he continues to do well from the standpoint of his TIPS with no episodes of GI bleeding since TIPS creation over four years ago.  I am very happy with his response.  His hepatic encephalopathy is well controlled and his liver function appears to be relatively stable.  Accordingly, we will continue to see him at one year intervals for follow-up ultrasound Doppler surveillance.  He and his  wife know to call should he have any interval questions or problems.  He will continue to follow-up with you as scheduled.  Again, I appreciate your allowing me to participate in the care of this very pleasant patient.  I will keep you updated with his status.  Let me know if I can be of further assistance.  Sincerely,   Oley Balm, MD  [facility]

## 2012-09-04 NOTE — Progress Notes (Signed)
States that he feels "a little lightheaded".  CBG:  101.  NPO for [place].  Given apple juice.   Patient & wife plan to have breakfast before returning home.  B/P:  141/59.     Katelinn Justice [place]ell [place], [place] 09/04/2012 9:17 AM

## 2012-09-06 ENCOUNTER — Other Ambulatory Visit: Payer: Self-pay | Admitting: Internal Medicine

## 2012-09-06 ENCOUNTER — Other Ambulatory Visit: Payer: Self-pay

## 2012-09-07 MED ORDER — INSULIN ASPART 100 UNIT/ML ~~LOC~~ SOLN: 30.0000 [IU] | Freq: Three times a day (TID) | SUBCUTANEOUS | Status: DC

## 2012-09-21 ENCOUNTER — Other Ambulatory Visit: Payer: Self-pay | Admitting: *Deleted

## 2012-09-21 MED ORDER — GLUCOSE BLOOD VI STRP: 1.0000 | ORAL_STRIP | Freq: Two times a day (BID) | Status: DC

## 2012-09-21 MED ORDER — "INSULIN SYRINGE 31G X 5/16"" 0.5 ML MISC": Status: DC

## 2012-10-03 ENCOUNTER — Encounter: Payer: Self-pay | Admitting: Internal Medicine

## 2012-10-03 ENCOUNTER — Ambulatory Visit (INDEPENDENT_AMBULATORY_CARE_PROVIDER_SITE_OTHER): Payer: Medicare Other | Admitting: Internal Medicine

## 2012-10-03 VITALS — BP 130/60 | HR 58 | Temp 98.2°F | Wt 261.4 lb

## 2012-10-03 DIAGNOSIS — M79609 Pain in unspecified limb: Secondary | ICD-10-CM

## 2012-10-03 DIAGNOSIS — M899 Disorder of bone, unspecified: Secondary | ICD-10-CM

## 2012-10-03 DIAGNOSIS — M25511 Pain in right shoulder: Secondary | ICD-10-CM

## 2012-10-03 DIAGNOSIS — M79602 Pain in left arm: Secondary | ICD-10-CM

## 2012-10-03 DIAGNOSIS — I2510 Atherosclerotic heart disease of native coronary artery without angina pectoris: Secondary | ICD-10-CM

## 2012-10-03 LAB — CK TOTAL AND CKMB (NOT AT ARMC): CK, MB: 2.7 ng/mL (ref 0.3–4.0)

## 2012-10-03 LAB — TROPONIN I: Troponin I: 0.07 ng/mL — ABNORMAL HIGH (ref <0.06)

## 2012-10-03 MED ORDER — NITROGLYCERIN 0.1 MG/HR TD PT24: 1.0000 | MEDICATED_PATCH | Freq: Every day | TRANSDERMAL | Status: DC

## 2012-10-03 NOTE — Progress Notes (Signed)
Subjective:    Patient ID: [name], male    DOB: Dec 11, 1935, 77 y.o.   MRN: [id number]  HPI He describes left upper extremity intermittent, sharp pain over the lower one third of the upper arm with radiation as far as the wrist over past month. It has progressed & is "more constant".This can occur without exertion but is worse when he is active. It can wake him at night. It can last hours. He has no associated chest pain with arm discomfort. He has not taken any sublingual nitroglycerin for this  It is associated with weakness in the left hand. It was not related to an injury or repetitive motion  He states that this is similar to the pain he had prior to 6 bypasses. At that time he did not have chest discomfort either    Review of Systems  He denies numbness or tingling in the left upper extremity. He has no associated nausea or diaphoresis with the pain.  Please also had some soreness to touch over the right clavicle.  The wound center is treating him with compression treatment from the thighs down to help improve the severe peripheral edema     Objective:   Physical Exam Gen.: chronically ill appearing but adequately nourished in appearance. Alert, appropriate and cooperative throughout exam.  Eyes: No corneal or conjunctival inflammation noted.Ptosis OD > OS. No icterus. Nose: External nasal exam reveals severe post inflammatory deformity. Nasal mucosa are dry. No lesions or exudates noted.  Mouth: Oral mucosa and oropharynx reveal no lesions or exudates.  Neck: No deformities, masses, or tendernes Lungs: Normal respiratory effort; chest expands symmetrically. Lungs reveal minor  Rales w/o wheezes, or increased work of breathing. Slightly tender over the right clavicular head/sternal joint Heart: Normal rate and rhythm. Normal S1 and S2. No gallop, click, or rub. Grade 2.5-3/6 systolic murmur. Abdomen: Bowel sounds normal; abdomen soft and nontender. No masses or  organomegaly.Vental hernia noted.                                Musculoskeletal/extremities: Fusiform knees ; R > L with crepitus. Tense edema despite compression stockings. Range of motion decreased .Tone & strength decreased. Joints  reveal flexion changes. Able to lie down & sit up with minmal help.  Vascular: Carotid bruits  Vs murmur radiation present. Decreased pedal pulses due to edema Neurologic: Alert and oriented x3. Deep tendon reflexes symmetrical but 0+ @ knees.          Skin: Forearms hyperpigmented. Lymph: No cervical, axillary, or inguinal lymphadenopathy present. Psych: Mood and affect are normal. Normally interactive                                                                                        Assessment & Plan:  #1 left upper extremity arm pain reminiscent of his atypical angina prior to his 6 vessel bypass. EKG reveals sinus bradycardia. No ischemic ST-T changes are present. Q waves noted in lead V2; first-degree AV block is also present  #2 tenderness right clavicular head/sternal joint  #3 CAD See  plan

## 2012-10-03 NOTE — Patient Instructions (Addendum)
Use an anti-inflammatory cream such as Aspercreme or Zostrix cream twice a day to the right clavicle as needed. In lieu of this warm moist compresses or  hot water bottle can be used. Do not apply ice .

## 2012-10-04 ENCOUNTER — Encounter: Payer: Self-pay | Admitting: Endocrinology

## 2012-10-04 ENCOUNTER — Ambulatory Visit (INDEPENDENT_AMBULATORY_CARE_PROVIDER_SITE_OTHER): Payer: Medicare Other | Admitting: Endocrinology

## 2012-10-04 VITALS — BP 132/72 | HR 78 | Ht 71.0 in | Wt 259.0 lb

## 2012-10-04 DIAGNOSIS — E1159 Type 2 diabetes mellitus with other circulatory complications: Secondary | ICD-10-CM

## 2012-10-04 DIAGNOSIS — I798 Other disorders of arteries, arterioles and capillaries in diseases classified elsewhere: Secondary | ICD-10-CM

## 2012-10-04 DIAGNOSIS — E1151 Type 2 diabetes mellitus with diabetic peripheral angiopathy without gangrene: Secondary | ICD-10-CM

## 2012-10-04 NOTE — Progress Notes (Signed)
Subjective:    Patient ID: [name], male    DOB: October 08, 1935, 77 y.o.   MRN: [id number]  HPI Pt returns for f/u of insulin-requiring DM (dx'ed 1992; He has moderate neuropathy of the lower extremities, and associated PAD, and CAD).  pt states he feels well in general.  he brings a record of his cbg's which i have reviewed today.  It varies from 42-358, but most are in the 100's.  It is still lowest in the middle of the night.  He eats dinner late.  AM-cbg's are sometimes in the 70's, which is 10 hrs since last dose of novolog.   Past Medical History  Diagnosis Date  . Diverticulosis   . Gastric antral vascular ectasia   . Rhinophyma   . Melanoma     resected by [facility] 2008  . CAD (coronary artery disease)     myoview 2007 no ischemia/ no ASA because of GI disease  . Atrial fibrillation     amiodarone in past, but problems and left off meds.  . Aortic stenosis     echo, September, 2010, mild aortic insufficiency  . Visual loss, one eye     right eye-ophthalmic arterial branch occlusion  . Internal hemorrhoids   . GERD (gastroesophageal reflux disease)   . Hypertension   . Allergic rhinitis   . Anemia     multifactorial  . Cirrhosis, alcoholic   . Thrombocytopenia   . Bleeding esophageal varices   . Encephalopathy, unspecified   . Gout   . Short-segment Barrett's esophagus     suspected  . Venous insufficiency   . Ejection fraction     EF 55%, echo, September, 2010  . Carotid bruit     Dopplers okay in the past  . Skin cancer     Basal cell and squamous cell  . Cellulitis     recurrent to RLE/notes 06/21/2012  . CHF (congestive heart failure)   . Sinus bradycardia     January, 2014  . Diastolic CHF     January, 2014  . Complication of anesthesia     "quit breathing once a long time ago; dropped my BP too; it was after I was given Dilaudid" (06/21/2012)  . Heart murmur   . Anginal pain   . Shortness of breath     "stay that way most of the time now" (06/21/2012)  . OSA  on CPAP   . Diabetes mellitus, type 2   . History of blood transfusion     "not related to OR" (06/21/2012)  . H/O hiatal hernia   . Osteoarthritis     "ALL OVER" (06/21/2012)  . Chronic lower back pain     Past Surgical History  Procedure Laterality Date  . Total knee arthroplasty Right   . Esophagogastroduodenoscopy  04/10/2008    esophageal varices, cardia varix  . Tips procedure  2/10  . Back surgery      X5 "for ruptured disc; last time did a fusion" (06/21/2012)  . Excisional hemorrhoidectomy    . Release scar contracture / graft repairs of hand      bilaterally   . Elbow surgery Right   . Hiatal hernia repair    . Colonoscopy w/ polypectomy  02/14/2007    adenomatous polyps, diverticulosis, internal hemorrhoids/rectal varices  . Cataract extraction, bilateral  January 2013  . Tonsillectomy    . Appendectomy    . Joint replacement    . Coronary artery bypass graft  1993  6 Bypass  . Cataract extraction w/ intraocular lens  implant, bilateral      History   Social History  . Marital Status: Married    Spouse Name: N/A    Number of Children: N/A  . Years of Education: N/A   Occupational History  .      retired   Social History Main Topics  . Smoking status: Former Smoker -- 2.00 packs/day for 20 years    Types: Cigarettes    Quit date: 02/22/1975  . Smokeless tobacco: Never Used  . Alcohol Use: 0.0 oz/week     Comment: 06/21/2012 "none since Feb 2010"  . Drug Use: No  . Sexual Activity: No   Other Topics Concern  . Not on file   Social History Narrative   Patient does not get regular exercise   Quit smoking over 40 years ago    Current Outpatient Prescriptions on File Prior to Visit  Medication Sig Dispense Refill  . allopurinol (ZYLOPRIM) 300 MG tablet Take 150 mg by mouth daily.      . Ascorbic Acid (VITAMIN C) 500 MG tablet Take 500 mg by mouth daily.        . Calcium-Magnesium-Zinc (CALCIUM/MAGNESIUM/ZINC FORMULA) 1000-500-50 MG TABS Take 1 tablet  by mouth daily.        . fentaNYL (DURAGESIC - DOSED MCG/HR) 50 MCG/HR Place 1 patch onto the skin every 3 (three) days.      . ferrous sulfate 325 (65 FE) MG tablet Take 325 mg by mouth 2 (two) times daily.        . furosemide (LASIX) 40 MG tablet Take 1 tablet (40 mg total) by mouth 2 (two) times daily.  135 tablet  3  . gabapentin (NEURONTIN) 300 MG capsule Take 1 capsule (300 mg total) by mouth 3 (three) times daily.  90 capsule  1  . glucose blood (FREESTYLE LITE) test strip 1 each by Other route 2 (two) times daily. And lancets: Dx code 250.01  100 each  5  . insulin aspart (NOVOLOG) 100 UNIT/ML injection Inject 30 Units into the skin 3 (three) times daily before meals. 30 units with breakfast, 30 units with lunch and 50 units with the evening meal.  1 vial  6  . Insulin Syringe-Needle U-100 (INSULIN SYRINGE .5CC/31GX5/16") 31G X 5/16" 0.5 ML MISC Use one syringe 4 times daily. Dx code 250.01  120 each  6  . isosorbide mononitrate (IMDUR) 60 MG 24 hr tablet Take 1 tablet (60 mg total) by mouth daily.  90 tablet  0  . lactulose (CHRONULAC) 10 GM/15ML solution Take 20 g by mouth 2 (two) times daily.      Marland Kitchen lactulose (CHRONULAC) 10 GM/15ML solution TAKE ONE AND ONE-HALF TABLESPOONFULS BY MOUTH TWICE DAILY  1892 mL  0  . loratadine (CLARITIN) 10 MG tablet Take 10 mg by mouth daily.      . metoprolol tartrate (LOPRESSOR) 25 MG tablet Take 12.5 mg by mouth 2 (two) times daily.      . nitroGLYCERIN (NITRODUR - DOSED IN MG/24 HR) 0.1 mg/hr patch Place 1 patch (0.1 mg total) onto the skin daily.  30 patch  2  . nitroGLYCERIN (NITROSTAT) 0.4 MG SL tablet Place 1 tablet (0.4 mg total) under the tongue every 5 (five) minutes as needed for chest pain.  20 tablet  0  . omeprazole (PRILOSEC) 20 MG capsule Take 20 mg by mouth daily.        . penicillin v potassium (  VEETID) 250 MG tablet Take 250 mg by mouth 1 day or 1 dose. Once daily      . polyethylene glycol (MIRALAX / GLYCOLAX) packet Take 17 g by mouth  daily as needed. Take two or three times a week      . predniSONE (DELTASONE) 5 MG tablet Take 5 mg by mouth daily.        . rifaximin (XIFAXAN) 550 MG TABS Take 1 tablet (550 mg total) by mouth 2 (two) times daily.  180 tablet  3  . spironolactone (ALDACTONE) 50 MG tablet Take 50 mg by mouth daily.      . Thiamine HCl (VITAMIN B-1) 250 MG tablet Take 125 mg by mouth daily.        No current facility-administered medications on file prior to visit.    Allergies  Allergen Reactions  . Shellfish-Derived Products     Rash & angioedema  . Amoxicillin-Pot Clavulanate     Unsure what reaction was but pt tolerated penicillin 05/25/2012  . Aspirin     REACTION: unable to tolerate due to cirrhosis and varices  . Buprenorphine Hcl-Naloxone Hcl Other (See Comments)    unresponsive  . Codeine     Rash    . Ezetimibe     REACTION: LEG/JOINT PAIN  . Hydromorphone Hcl     unknown  . Meperidine Hcl     unknown  . Morphine     unknown  . Morphine Sulfate     REACTION: unspecified  . Oxycodone     hallucinations  . Sulfonamide Derivatives     Family History  Problem Relation Age of Onset  . Alzheimer's disease Mother   . Cancer Father     GI (stomach?)  . Heart disease Sister     2 sisters  . Liver disease Sister   . Diabetes Brother   . Heart disease Brother     3 brothers  . Liver disease Brother   . Heart attack Other     sibling  . Melanoma Other     sibling  . Stroke Other     sibling   BP 132/72  Pulse 78  Ht 5\' 11"  (1.803 m)  Wt 259 lb (117.482 kg)  BMI 36.14 kg/m2  SpO2 97%  Review of Systems Denies LOC and weight change.      Objective:   Physical Exam VITAL SIGNS:  See vs page GENERAL: no distress SKIN:  Insulin injection sites at the triceps areas are normal  Lab Results  Component Value Date   HGBA1C 6.9* 10/04/2012      Assessment & Plan:  DM: well-controlled.  This insulin regimen was chosen from multiple options, as it best matches his insulin  to his changing requirements throughout the day.  The benefits of glycemic control must be weighed against the risks of hypoglycemia.

## 2012-10-04 NOTE — Patient Instructions (Addendum)
blood tests are being requested for you today.  We'll contact you with results. Please reduce the novolog to 30 units with breakfast, 30 units with lunch and 50 units with the evening meal. check your blood sugar 2 times a day.  vary the time of day when you check, between before the 3 meals, and at bedtime.  also check if you have symptoms of your blood sugar being too high or too low.  please keep a record of the readings and bring it to your next appointment here.  please call us sooner if you are having low blood sugar episodes.  It is very important to vary the time of day when you check, including at bedtime.   Please come back for a follow-up appointment in 3 months.

## 2012-10-09 ENCOUNTER — Other Ambulatory Visit (INDEPENDENT_AMBULATORY_CARE_PROVIDER_SITE_OTHER): Payer: Medicare Other

## 2012-10-09 ENCOUNTER — Encounter: Payer: Self-pay | Admitting: Internal Medicine

## 2012-10-09 ENCOUNTER — Ambulatory Visit (INDEPENDENT_AMBULATORY_CARE_PROVIDER_SITE_OTHER): Payer: Medicare Other | Admitting: Internal Medicine

## 2012-10-09 VITALS — BP 128/70 | HR 60 | Ht 74.0 in | Wt 261.0 lb

## 2012-10-09 DIAGNOSIS — K224 Dyskinesia of esophagus: Secondary | ICD-10-CM

## 2012-10-09 DIAGNOSIS — D509 Iron deficiency anemia, unspecified: Secondary | ICD-10-CM

## 2012-10-09 DIAGNOSIS — K7030 Alcoholic cirrhosis of liver without ascites: Secondary | ICD-10-CM

## 2012-10-09 DIAGNOSIS — R1310 Dysphagia, unspecified: Secondary | ICD-10-CM

## 2012-10-09 DIAGNOSIS — R609 Edema, unspecified: Secondary | ICD-10-CM

## 2012-10-09 HISTORY — DX: Dyskinesia of esophagus: K22.4

## 2012-10-09 LAB — COMPREHENSIVE METABOLIC PANEL
AST: 24 U/L (ref 0–37)
Alkaline Phosphatase: 53 U/L (ref 39–117)
BUN: 21 mg/dL (ref 6–23)
Calcium: 9 mg/dL (ref 8.4–10.5)
Creatinine, Ser: 1.2 mg/dL (ref 0.4–1.5)
Total Bilirubin: 1.2 mg/dL (ref 0.3–1.2)

## 2012-10-09 LAB — CBC WITH DIFFERENTIAL/PLATELET
Basophils Relative: 0.5 % (ref 0.0–3.0)
Eosinophils Absolute: 0 10*3/uL (ref 0.0–0.7)
Hemoglobin: 11.5 g/dL — ABNORMAL LOW (ref 13.0–17.0)
Lymphs Abs: 1 10*3/uL (ref 0.7–4.0)
MCHC: 33.5 g/dL (ref 30.0–36.0)
MCV: 98.2 fl (ref 78.0–100.0)
Monocytes Absolute: 0.4 10*3/uL (ref 0.1–1.0)
Neutro Abs: 2.5 10*3/uL (ref 1.4–7.7)
RBC: 3.48 Mil/uL — ABNORMAL LOW (ref 4.22–5.81)

## 2012-10-09 MED ORDER — SPIRONOLACTONE 50 MG PO TABS: 50.0000 mg | ORAL_TABLET | Freq: Every day | ORAL | Status: DC

## 2012-10-09 MED ORDER — LACTULOSE 10 GM/15ML PO SOLN: 20.0000 g | Freq: Two times a day (BID) | ORAL | Status: DC

## 2012-10-09 MED ORDER — FUROSEMIDE 40 MG PO TABS: 40.0000 mg | ORAL_TABLET | Freq: Two times a day (BID) | ORAL | Status: DC

## 2012-10-09 MED ORDER — PANTOPRAZOLE SODIUM 40 MG PO TBEC: 40.0000 mg | DELAYED_RELEASE_TABLET | Freq: Every day | ORAL | Status: DC

## 2012-10-09 NOTE — Patient Instructions (Addendum)
Your physician has requested that you go to the basement for the following lab work before leaving today: CBC/diff, CMET  We have sent the following medications to your pharmacy for you to pick up at your convenience: Pantoprazole and refills on your spironolactone, lactulose, furosemide.  Call us back in a month with an update on your swallowing.  We would like to see you back in the office in 4 months.   I appreciate the opportunity to care for you.

## 2012-10-09 NOTE — Assessment & Plan Note (Addendum)
Improved w/ compression device Continue diuretics Recheck labs - consider higher diuretic dose again if needed but quality of life much better now

## 2012-10-09 NOTE — Assessment & Plan Note (Signed)
Stable

## 2012-10-09 NOTE — Assessment & Plan Note (Addendum)
Recheck cbc - have avoided repeating colonoscopy but depending upon these results and cardiology evaluation may need to consider if persistent issues - ha has had sig adenomas of colon in past

## 2012-10-09 NOTE — Progress Notes (Signed)
  Subjective:    Patient ID: [name], male    DOB: [birth year], 77 y.o.   MRN: [id number]  HPI [name] is her w/ wife for 4 month f/u, last seen 06/2012. Has been in wound clinic since. 1) Cirrhosis w/ encephalopathy and fluid overload - stable needs refills of spironolactone, furosemide and lactulose 2) edema - much better since using compression device provided through wound clinic 3) dysphagia/odynopghagia - problems seemed to start after using doxycycline in Spring (cellulitis admit May) - reported they were better - but says not having pain but still w/ some food stopping or moving slow. Wife says he belches a lot. Tried branded Prilosec instead of generic at 20 mg daily and not much different  He remains active in his shop and yard/garden. Forgets to eat at times and blood sugars fluctuating w/ some lows. Lantus was stopped last week.  Medications, allergies, past medical history, past surgical history, family history and social history are reviewed and updated in the EMR.  Review of Systems BS 31 this AM Has been having left arm pain that reminds him of pre-CABG angina - to see Dr. Myrtis Ser soon    Objective:   Physical Exam General:  NAD, chronically ill Mouth/pharynx: clear Eyes:   anicteric Lungs:  Clear except crackles at right base Heart:  S1S2 no rubs, murmurs or gallops, no JVD Abdomen:  soft and nontender, BS+, obese Ext:   Compression hose on - 1+ RLE and trace LLE edema Neuro/Psych: Slightly flat affect - stable  Data Reviewed:  Endocrinology and PCP notes from last week Prior labs in last 6 months  Wt Readings from Last 3 Encounters:  10/09/12 261 lb (118.389 kg)  10/04/12 259 lb (117.482 kg)  10/03/12 261 lb 6.4 oz (118.57 kg)      Assessment & Plan:  CIRRHOSIS, ALCOHOLIC - Plan: CBC with Differential, Comprehensive metabolic panel  ANEMIA, IRON DEFICIENCY - Plan: CBC with Differential, Comprehensive metabolic panel  Edema  Dysphagia, unspecified(787.20) -  Plan: pantoprazole (PROTONIX) 40 MG tablet, CBC with Differential, Comprehensive metabolic panel  See problem-list charting

## 2012-10-09 NOTE — Assessment & Plan Note (Signed)
Cause not clear - in past I thought he had a pill ulcer from doxycycline Possibilities include Candidiasis ( no oral signs), dysmotility, GERD stricture. Malignancy possible but seems unlikely w/ weight loss etc. Given ? Of angina hold off on EGD Will try changing omeprazole 20 mg qd to pantoprazole 40 mg qd To call me if that does not work after 1 month  Await cardiology evaluation

## 2012-10-10 ENCOUNTER — Encounter: Payer: Self-pay | Admitting: Internal Medicine

## 2012-10-10 NOTE — Progress Notes (Signed)
Quick Note:  Labs ok No changes ______

## 2012-10-29 ENCOUNTER — Ambulatory Visit (INDEPENDENT_AMBULATORY_CARE_PROVIDER_SITE_OTHER): Payer: Medicare Other | Admitting: Cardiology

## 2012-10-29 ENCOUNTER — Encounter: Payer: Self-pay | Admitting: Cardiology

## 2012-10-29 VITALS — BP 164/74 | HR 58 | Ht 74.0 in | Wt 212.1 lb

## 2012-10-29 DIAGNOSIS — M79609 Pain in unspecified limb: Secondary | ICD-10-CM

## 2012-10-29 DIAGNOSIS — I4891 Unspecified atrial fibrillation: Secondary | ICD-10-CM

## 2012-10-29 DIAGNOSIS — I350 Nonrheumatic aortic (valve) stenosis: Secondary | ICD-10-CM

## 2012-10-29 DIAGNOSIS — M79602 Pain in left arm: Secondary | ICD-10-CM

## 2012-10-29 DIAGNOSIS — I359 Nonrheumatic aortic valve disorder, unspecified: Secondary | ICD-10-CM

## 2012-10-29 DIAGNOSIS — I2510 Atherosclerotic heart disease of native coronary artery without angina pectoris: Secondary | ICD-10-CM

## 2012-10-29 NOTE — Assessment & Plan Note (Signed)
The major complaint at this time is his left arm pain. He says it feels similar to this pain he had before his bypass surgery. Because of this we will need to proceed with a nuclear stress study. However I'm not convinced that this represents unstable angina. We do not need to proceed with catheterization her hospitalization at this point.

## 2012-10-29 NOTE — Assessment & Plan Note (Signed)
Patient is holding sinus rhythm. No change in therapy.

## 2012-10-29 NOTE — Assessment & Plan Note (Signed)
The patient has aortic stenosis but it is not causing any difficulties at this time. No further workup.

## 2012-10-29 NOTE — Progress Notes (Signed)
HPI   The patient is seen for followup his overall cardiac status. He has had persistent left arm discomfort for many weeks. Etiology is not clear. He says the discomfort is similar to the discomfort he had before his bypass surgery. He did not originally have chest discomfort at the time of his surgery and he is not having chest discomfort now. However the pain appears to be very atypical for cardiac pain. It has persisted for an long time. It is present most of the time. It does not change with any type of exertion.  Allergies  Allergen Reactions  . Shellfish-Derived Products     Rash & angioedema  . Amoxicillin-Pot Clavulanate     Unsure what reaction was but pt tolerated penicillin 05/25/2012  . Aspirin     REACTION: unable to tolerate due to cirrhosis and varices  . Buprenorphine Hcl-Naloxone Hcl Other (See Comments)    unresponsive  . Codeine     Rash    . Ezetimibe     REACTION: LEG/JOINT PAIN  . Hydromorphone Hcl     unknown  . Meperidine Hcl     unknown  . Morphine     unknown  . Morphine Sulfate     REACTION: unspecified  . Oxycodone     hallucinations  . Sulfonamide Derivatives     Current Outpatient Prescriptions  Medication Sig Dispense Refill  . allopurinol (ZYLOPRIM) 300 MG tablet Take 150 mg by mouth daily.      . Ascorbic Acid (VITAMIN C) 500 MG tablet Take 500 mg by mouth daily.        . Calcium-Magnesium-Zinc (CALCIUM/MAGNESIUM/ZINC FORMULA) 1000-500-50 MG TABS Take 1 tablet by mouth daily.        . fentaNYL (DURAGESIC - DOSED MCG/HR) 50 MCG/HR Place 1 patch onto the skin every 3 (three) days.      . ferrous sulfate 325 (65 FE) MG tablet Take 325 mg by mouth 2 (two) times daily.        . furosemide (LASIX) 40 MG tablet Take 1 tablet (40 mg total) by mouth 2 (two) times daily.  180 tablet  1  . gabapentin (NEURONTIN) 300 MG capsule Take 1 capsule (300 mg total) by mouth 3 (three) times daily.  90 capsule  1  . glucose blood (FREESTYLE LITE) test strip 1  each by Other route 2 (two) times daily. And lancets: Dx code 250.01  100 each  5  . insulin aspart (NOVOLOG) 100 UNIT/ML injection Inject 30 Units into the skin 3 (three) times daily before meals. 30 units with breakfast, 30 units with lunch and 50 units with the evening meal.  1 vial  6  . Insulin Syringe-Needle U-100 (INSULIN SYRINGE .5CC/31GX5/16") 31G X 5/16" 0.5 ML MISC Use one syringe 4 times daily. Dx code 250.01  120 each  6  . isosorbide mononitrate (IMDUR) 60 MG 24 hr tablet Take 1 tablet (60 mg total) by mouth daily.  90 tablet  0  . lactulose (CHRONULAC) 10 GM/15ML solution Take 30 mL (20 g total) by mouth 2 (two) times daily.  1892 mL  5  . loratadine (CLARITIN) 10 MG tablet Take 10 mg by mouth daily.      . metoprolol tartrate (LOPRESSOR) 25 MG tablet Take 12.5 mg by mouth 2 (two) times daily.      . nitroGLYCERIN (NITRODUR - DOSED IN MG/24 HR) 0.1 mg/hr patch Place 1 patch (0.1 mg total) onto the skin daily.  30 patch  2  . nitroGLYCERIN (NITROSTAT) 0.4 MG SL tablet Place 1 tablet (0.4 mg total) under the tongue every 5 (five) minutes as needed for chest pain.  20 tablet  0  . pantoprazole (PROTONIX) 40 MG tablet Take 1 tablet (40 mg total) by mouth daily before breakfast.  30 tablet  11  . penicillin v potassium (VEETID) 250 MG tablet Take 250 mg by mouth 1 day or 1 dose. Once daily      . polyethylene glycol (MIRALAX / GLYCOLAX) packet Take 17 g by mouth daily as needed. Take two or three times a week      . predniSONE (DELTASONE) 5 MG tablet Take 5 mg by mouth daily.        . rifaximin (XIFAXAN) 550 MG TABS Take 1 tablet (550 mg total) by mouth 2 (two) times daily.  180 tablet  3  . spironolactone (ALDACTONE) 50 MG tablet Take 1 tablet (50 mg total) by mouth daily.  90 tablet  1  . Thiamine HCl (VITAMIN B-1) 250 MG tablet Take 125 mg by mouth daily.        No current facility-administered medications for this visit.    History   Social History  . Marital Status: Married     Spouse Name: N/A    Number of Children: N/A  . Years of Education: N/A   Occupational History  .      retired   Social History Main Topics  . Smoking status: Former Smoker -- 2.00 packs/day for 20 years    Types: Cigarettes    Quit date: 02/22/1975  . Smokeless tobacco: Never Used  . Alcohol Use: 0.0 oz/week     Comment: 06/21/2012 "none since Feb 2010"  . Drug Use: No  . Sexual Activity: No   Other Topics Concern  . Not on file   Social History Narrative   Patient does not get regular exercise   Quit smoking over 40 years ago    Family History  Problem Relation Age of Onset  . Alzheimer's disease Mother   . Cancer Father     GI (stomach?)  . Heart disease Sister     2 sisters  . Liver disease Sister   . Diabetes Brother   . Heart disease Brother     3 brothers  . Liver disease Brother   . Heart attack Other     sibling  . Melanoma Other     sibling  . Stroke Other     sibling    Past Medical History  Diagnosis Date  . Diverticulosis   . Gastric antral vascular ectasia   . Rhinophyma   . Melanoma     resected by [facility] 2008  . CAD (coronary artery disease)     myoview 2007 no ischemia/ no ASA because of GI disease  . Atrial fibrillation     amiodarone in past, but problems and left off meds.  . Aortic stenosis     echo, September, 2010, mild aortic insufficiency  . Visual loss, one eye     right eye-ophthalmic arterial branch occlusion  . Internal hemorrhoids   . GERD (gastroesophageal reflux disease)   . Hypertension   . Allergic rhinitis   . Anemia     multifactorial  . Cirrhosis, alcoholic   . Thrombocytopenia   . Bleeding esophageal varices   . Encephalopathy, unspecified   . Gout   . Short-segment Barrett's esophagus     suspected  . Venous insufficiency   .  Ejection fraction     EF 55%, echo, September, 2010  . Carotid bruit     Dopplers okay in the past  . Skin cancer     Basal cell and squamous cell  . Cellulitis     recurrent to  RLE/notes 06/21/2012  . CHF (congestive heart failure)   . Sinus bradycardia     January, 2014  . Diastolic CHF     January, 2014  . Complication of anesthesia     "quit breathing once a long time ago; dropped my BP too; it was after I was given Dilaudid" (06/21/2012)  . Heart murmur   . Anginal pain   . Shortness of breath     "stay that way most of the time now" (06/21/2012)  . OSA on CPAP   . Diabetes mellitus, type 2   . History of blood transfusion     "not related to OR" (06/21/2012)  . H/O hiatal hernia   . Osteoarthritis     "ALL OVER" (06/21/2012)  . Chronic lower back pain     Past Surgical History  Procedure Laterality Date  . Total knee arthroplasty Right   . Esophagogastroduodenoscopy  04/10/2008    esophageal varices, cardia varix  . Tips procedure  2/10  . Back surgery      X5 "for ruptured disc; last time did a fusion" (06/21/2012)  . Excisional hemorrhoidectomy    . Release scar contracture / graft repairs of hand      bilaterally   . Elbow surgery Right   . Hiatal hernia repair    . Colonoscopy w/ polypectomy  02/14/2007    adenomatous polyps, diverticulosis, internal hemorrhoids/rectal varices  . Cataract extraction, bilateral  January 2013  . Tonsillectomy    . Appendectomy    . Joint replacement    . Coronary artery bypass graft  1993    6 Bypass  . Cataract extraction w/ intraocular lens  implant, bilateral      Patient Active Problem List   Diagnosis Date Noted  . Ejection fraction     Priority: High  . CAD (coronary artery disease)     Priority: High  . Atrial fibrillation     Priority: High  . CORONARY ARTERY BYPASS GRAFT, HX OF 11/12/2008    Priority: High  . HYPERLIPIDEMIA 12/04/2006    Priority: High  . HYPERTENSION, ESSENTIAL NOS 12/04/2006    Priority: High  . Dysphagia, unspecified(787.20) 10/09/2012  . Open wound of knee, leg (except thigh), and ankle, complicated 08/08/2012  . Lymphedema of lower extremity 07/20/2012  . Venous stasis  of lower extremity 07/20/2012  . DM (diabetes mellitus) with peripheral vascular complication 06/28/2012  . Cellulitis of right lower extremity 06/21/2012  . Congestive heart failure 05/25/2012  . Sinus bradycardia   . Aortic stenosis   . Diastolic CHF   . Edema 12/27/2011  . DM2 (diabetes mellitus, type 2) 08/01/2011  . Unspecified adverse effect of unspecified drug, medicinal and biological substance 06/22/2011  . Carotid bruit   . Paresthesia of foot 11/01/2010  . NEUROPATHY 04/29/2010  .  ASCITES 10/23/2009  . ARTERIAL BRANCH OCCLUSION OF RETINA 01/30/2009  . HYPERURICEMIA 12/17/2008  . LOW BACK PAIN, CHRONIC 08/29/2008  . CIRRHOSIS, ALCOHOLIC 05/21/2008  . Hepatic encephalopathy 05/07/2008  . COLONIC POLYPS, ADENOMATOUS, HX OF 03/19/2007  . THROMBOCYTOPENIA, CHRONIC 12/28/2006  . ALLERGIC RHINITIS 12/05/2006  . GERD 12/05/2006  . OSTEOARTHRITIS 12/05/2006  . SLEEP APNEA 12/05/2006  . GOUT NOS 12/04/2006  .  ANEMIA, IRON DEFICIENCY 12/04/2006  . HYPERPLASIA, PRST NOS W/O URINARY OBST/LUTS 12/04/2006    ROS   Patient denies fever, chills, headache, sweats, rash, change in vision, change in hearing, chest pain, cough, nausea vomiting, urinary symptoms. All other systems are reviewed and are negative.  PHYSICAL EXAM   Patient is here with his wife. He is oriented to person time and place. Affect is normal. There is no jugulovenous distention.. Lungs are clear. Respiratory effort is not labored. Cardiac exam reveals S1 and S2. There is a crescendo decrescendo murmur consistent with his aortic valve disease. This does radiate into his neck. His abdomen is soft. He has no significant peripheral edema.  Filed Vitals:   10/29/12 1041  BP: 164/74  Pulse: 58  Height: 6\' 2"  (1.88 m)  Weight: 212 lb 1.9 oz (96.217 kg)  SpO2: 98%   Because he had persistent discomfort EKG is done today and compared with the tracing of October 03, 2012. There is no acute change.  ASSESSMENT &  PLAN

## 2012-10-29 NOTE — Assessment & Plan Note (Signed)
The patient has known coronary disease. His last nuclear scan was 2007. We know from last year that his LV function is good. We will proceed with a nuclear stress study because of the arm pain.

## 2012-10-29 NOTE — Patient Instructions (Addendum)
Your physician has requested that you have a lexiscan myoview.. Please follow instruction sheet, as given.  Your physician wants you to follow-up in:6 months  You will receive a reminder letter in the mail two months in advance. If you don't receive a letter, please call our office to schedule the follow-up appointment.    

## 2012-11-05 ENCOUNTER — Other Ambulatory Visit: Payer: Self-pay | Admitting: *Deleted

## 2012-11-05 MED ORDER — GABAPENTIN 300 MG PO CAPS: 300.0000 mg | ORAL_CAPSULE | Freq: Three times a day (TID) | ORAL | Status: DC

## 2012-11-05 NOTE — Telephone Encounter (Signed)
Rx refilled for gabapentin 300 mg.  Ag cma

## 2012-11-06 ENCOUNTER — Ambulatory Visit (HOSPITAL_COMMUNITY): Payer: Medicare Other | Attending: Cardiology | Admitting: Radiology

## 2012-11-06 VITALS — BP 150/60 | HR 55 | Ht 74.0 in | Wt 210.0 lb

## 2012-11-06 DIAGNOSIS — Z8249 Family history of ischemic heart disease and other diseases of the circulatory system: Secondary | ICD-10-CM | POA: Insufficient documentation

## 2012-11-06 DIAGNOSIS — M79602 Pain in left arm: Secondary | ICD-10-CM

## 2012-11-06 DIAGNOSIS — Z794 Long term (current) use of insulin: Secondary | ICD-10-CM | POA: Insufficient documentation

## 2012-11-06 DIAGNOSIS — I2510 Atherosclerotic heart disease of native coronary artery without angina pectoris: Secondary | ICD-10-CM

## 2012-11-06 DIAGNOSIS — Z87891 Personal history of nicotine dependence: Secondary | ICD-10-CM | POA: Insufficient documentation

## 2012-11-06 DIAGNOSIS — R0989 Other specified symptoms and signs involving the circulatory and respiratory systems: Secondary | ICD-10-CM | POA: Insufficient documentation

## 2012-11-06 DIAGNOSIS — M79609 Pain in unspecified limb: Secondary | ICD-10-CM | POA: Insufficient documentation

## 2012-11-06 DIAGNOSIS — I6529 Occlusion and stenosis of unspecified carotid artery: Secondary | ICD-10-CM | POA: Insufficient documentation

## 2012-11-06 DIAGNOSIS — I10 Essential (primary) hypertension: Secondary | ICD-10-CM | POA: Insufficient documentation

## 2012-11-06 DIAGNOSIS — E119 Type 2 diabetes mellitus without complications: Secondary | ICD-10-CM | POA: Insufficient documentation

## 2012-11-06 DIAGNOSIS — R079 Chest pain, unspecified: Secondary | ICD-10-CM | POA: Insufficient documentation

## 2012-11-06 DIAGNOSIS — I739 Peripheral vascular disease, unspecified: Secondary | ICD-10-CM | POA: Insufficient documentation

## 2012-11-06 DIAGNOSIS — R0609 Other forms of dyspnea: Secondary | ICD-10-CM | POA: Insufficient documentation

## 2012-11-06 DIAGNOSIS — R0602 Shortness of breath: Secondary | ICD-10-CM

## 2012-11-06 DIAGNOSIS — R002 Palpitations: Secondary | ICD-10-CM | POA: Insufficient documentation

## 2012-11-06 DIAGNOSIS — R5381 Other malaise: Secondary | ICD-10-CM | POA: Insufficient documentation

## 2012-11-06 DIAGNOSIS — I25810 Atherosclerosis of coronary artery bypass graft(s) without angina pectoris: Secondary | ICD-10-CM | POA: Insufficient documentation

## 2012-11-06 MED ORDER — REGADENOSON 0.4 MG/5ML IV SOLN
0.4000 mg | Freq: Once | INTRAVENOUS | Status: AC
  Administered 2012-11-06: 0.4 mg via INTRAVENOUS

## 2012-11-06 MED ORDER — TECHNETIUM TC 99M SESTAMIBI GENERIC - CARDIOLITE
10.0000 | Freq: Once | INTRAVENOUS | Status: AC | PRN
  Administered 2012-11-06: 10 via INTRAVENOUS

## 2012-11-06 MED ORDER — TECHNETIUM TC 99M SESTAMIBI GENERIC - CARDIOLITE
30.0000 | Freq: Once | INTRAVENOUS | Status: AC | PRN
  Administered 2012-11-06: 30 via INTRAVENOUS

## 2012-11-06 NOTE — Progress Notes (Signed)
[facility] SITE 3 NUCLEAR MED [address] [place], [place] [address] [phone]    Cardiology Nuclear Med Study  [name] is a 77 y.o. male     MRN : [id number]     DOB: 19-Aug-1935  Procedure Date: 11/06/2012  Nuclear Med Background Indication for Stress Test:  Evaluation for Ischemia and Graft Patency History:  '93 CABG; '00 Cath:SVG-OM 1 occluded; '07 MPS:no ischemia; 4/14 Echo:EF=65%; h/o mild AS Cardiac Risk Factors: Carotid Disease, Family History - CAD, History of Smoking, Hypertension, IDDM Type 2, Lipids and PVD  Symptoms:  Chest Tightness with Left Arm Pain (last episode of chest discomfort is now, 3/10), DOE, Fatigue, Palpitations and Rapid HR   Nuclear Pre-Procedure Caffeine/Decaff Intake:  None NPO After: 7:30pm   Lungs:  Clear. O2 Sat: 98% on room air. IV 0.9% NS with Angio Cath:  22g  IV Site: R Hand  IV Started by:  Bonnita Levan, RN  Chest Size (in):  48 Cup Size: n/a  Height: 6\' 2"  (1.88 m)  Weight:  210 lb (95.255 kg)  BMI:  Body mass index is 26.95 kg/(m^2). Tech Comments:  BS @ 6:30 AM= 179. Patient is in 2nd degree AVB-type I, which appears to be new, discussed with Dr. Antoine Poche, DOD,  who concurred.    Nuclear Med Study 1 or 2 day study: 1 day  Stress Test Type:  Lexiscan  Reading MD: Cassell Clement, MD  Order Authorizing Provider:  Willa Rough, MD  Resting Radionuclide: Technetium 60m Sestamibi  Resting Radionuclide Dose: 11.0 mCi   Stress Radionuclide:  Technetium 68m Sestamibi  Stress Radionuclide Dose: 33.0 mCi           Stress Protocol Rest HR: 55 Stress HR: 68  Rest BP: 150/60 Stress BP: 151/59  Exercise Time (min): n/a METS: n/a   Predicted Max HR: 146 bpm % Max HR: 46.58 bpm Rate Pressure Product: 21308   Dose of Adenosine (mg):  n/a Dose of Lexiscan: 0.4 mg  Dose of Atropine (mg): n/a Dose of Dobutamine: n/a mcg/kg/min (at max HR)  Stress Test Technologist: Smiley Houseman, CMA-N  Nuclear Technologist:  Doyne Keel,  CNMT     Rest Procedure:  Myocardial perfusion imaging was performed at rest 45 minutes following the intravenous administration of Technetium 45m Sestamibi.  Rest ECG: NSR with non-specific ST-T wave changes  Stress Procedure:  The patient received IV Lexiscan 0.4 mg over 15-seconds.  Technetium 56m Sestamibi injected at 30-seconds.  Patient c/o chest pressure, stomach ache and leg weakness with Lexiscan.  Quantitative spect images were obtained after a 45 minute delay.  Stress ECG: No significant change from baseline ECG  QPS Raw Data Images:  Normal; no motion artifact; normal heart/lung ratio. Stress Images:  Normal homogeneous uptake in all areas of the myocardium. Rest Images:  Normal homogeneous uptake in all areas of the myocardium. Subtraction (SDS):  No evidence of ischemia. Transient Ischemic Dilatation (Normal <1.22):  NA Lung/Heart Ratio (Normal <0.45):  0.43  Quantitative Gated Spect Images QGS EDV:  NA QGS ESV:  NA  Impression Exercise Capacity:  Lexiscan with no exercise. BP Response:  Normal blood pressure response. Clinical Symptoms:  Mild chest pain/dyspnea. ECG Impression:  There are scattered PACs. No EKG changes to suggest ischemia. Comparison with Prior Nuclear Study: No images to compare  Overall Impression:  Low risk stress nuclear study.  No evidence of ischemia.  Nongated because of frequent PACs.  LV Ejection Fraction: Study not gated.  LV Wall Motion:  NA  Cassell Clement

## 2012-11-07 ENCOUNTER — Encounter: Payer: Self-pay | Admitting: Cardiology

## 2012-11-08 ENCOUNTER — Telehealth: Payer: Self-pay | Admitting: Cardiology

## 2012-11-08 NOTE — Telephone Encounter (Signed)
New problem  Larita Fife, Patients wife is calling for the result of her husband stress test.

## 2012-11-09 NOTE — Telephone Encounter (Signed)
**Note De-Identified Muaad Boehning Obfuscation** Arleen advised, she verbalized understanding.

## 2012-11-13 ENCOUNTER — Telehealth: Payer: Self-pay | Admitting: *Deleted

## 2012-11-13 DIAGNOSIS — M25512 Pain in left shoulder: Secondary | ICD-10-CM

## 2012-11-13 NOTE — Telephone Encounter (Signed)
Triage line message: Patients wife called office and stated that [name] had a stress test done at his cardiologists office and the results were normal. He is still having pain in his left arm. Wife asked if a referral to Ortho would be the appropriate next step or if we had any other suggestions regarding the pain in his left arm. Please advise.

## 2012-11-13 NOTE — Telephone Encounter (Signed)
Ortho referral OK: L shoulder pain; negative stress test (I reviewed it)

## 2012-11-14 NOTE — Telephone Encounter (Signed)
Referral placed.

## 2012-11-22 ENCOUNTER — Other Ambulatory Visit: Payer: Self-pay | Admitting: Orthopedic Surgery

## 2012-11-22 DIAGNOSIS — R079 Chest pain, unspecified: Secondary | ICD-10-CM

## 2012-11-26 ENCOUNTER — Ambulatory Visit
Admission: RE | Admit: 2012-11-26 | Discharge: 2012-11-26 | Disposition: A | Discharge status: Home / Self Care | Payer: Medicare Other | Source: Ambulatory Visit | Attending: Orthopedic Surgery | Admitting: Orthopedic Surgery

## 2012-11-26 DIAGNOSIS — R079 Chest pain, unspecified: Secondary | ICD-10-CM

## 2012-11-26 MED ORDER — METHYLPREDNISOLONE ACETATE 40 MG/ML INJ SUSP (RADIOLOG
120.0000 mg | Freq: Once | INTRAMUSCULAR | Status: AC
  Administered 2012-11-26: 120 mg via EPIDURAL

## 2012-11-26 MED ORDER — IOHEXOL 180 MG/ML  SOLN
1.0000 mL | Freq: Once | INTRAMUSCULAR | Status: AC | PRN
  Administered 2012-11-26: 1 mL via EPIDURAL

## 2012-12-05 ENCOUNTER — Ambulatory Visit: Payer: Medicare Other | Admitting: Infectious Diseases

## 2012-12-27 ENCOUNTER — Ambulatory Visit (INDEPENDENT_AMBULATORY_CARE_PROVIDER_SITE_OTHER): Payer: Medicare Other | Admitting: Podiatrist

## 2012-12-27 ENCOUNTER — Encounter: Payer: Self-pay | Admitting: Podiatrist

## 2012-12-27 ENCOUNTER — Other Ambulatory Visit: Payer: Self-pay

## 2012-12-27 VITALS — BP 136/62 | HR 67 | Resp 16 | Ht 75.0 in | Wt 250.0 lb

## 2012-12-27 DIAGNOSIS — M2040 Other hammer toe(s) (acquired), unspecified foot: Secondary | ICD-10-CM

## 2012-12-27 DIAGNOSIS — E1159 Type 2 diabetes mellitus with other circulatory complications: Secondary | ICD-10-CM

## 2012-12-27 DIAGNOSIS — E1151 Type 2 diabetes mellitus with diabetic peripheral angiopathy without gangrene: Secondary | ICD-10-CM

## 2012-12-27 NOTE — Patient Instructions (Signed)
Diabetes and Foot Care Diabetes may cause you to have problems because of poor blood supply (circulation) to your feet and legs. This may cause the skin on your feet to become thinner, break easier, and heal more slowly. Your skin may become dry, and the skin may peel and crack. You may also have nerve damage in your legs and feet causing decreased feeling in them. You may not notice minor injuries to your feet that could lead to infections or more serious problems. Taking care of your feet is one of the most important things you can do for yourself.  HOME CARE INSTRUCTIONS  Wear shoes at all times, even in the house. Do not go barefoot. Bare feet are easily injured.  Check your feet daily for blisters, cuts, and redness. If you cannot see the bottom of your feet, use a mirror or ask someone for help.  Wash your feet with warm water (do not use hot water) and mild soap. Then pat your feet and the areas between your toes until they are completely dry. Do not soak your feet as this can dry your skin.  Apply a moisturizing lotion or petroleum jelly (that does not contain alcohol and is unscented) to the skin on your feet and to dry, brittle toenails. Do not apply lotion between your toes.  Trim your toenails straight across. Do not dig under them or around the cuticle. File the edges of your nails with an emery board or nail file.  Do not cut corns or calluses or try to remove them with medicine.  Wear clean socks or stockings every day. Make sure they are not too tight. Do not wear knee-high stockings since they may decrease blood flow to your legs.  Wear shoes that fit properly and have enough cushioning. To break in new shoes, wear them for just a few hours a day. This prevents you from injuring your feet. Always look in your shoes before you put them on to be sure there are no objects inside.  Do not cross your legs. This may decrease the blood flow to your feet.  If you find a minor scrape,  cut, or break in the skin on your feet, keep it and the skin around it clean and dry. These areas may be cleansed with mild soap and water. Do not cleanse the area with peroxide, alcohol, or iodine.  When you remove an adhesive bandage, be sure not to damage the skin around it.  If you have a wound, look at it several times a day to make sure it is healing.  Do not use heating pads or hot water bottles. They may burn your skin. If you have lost feeling in your feet or legs, you may not know it is happening until it is too late.  Make sure your health care provider performs a complete foot exam at least annually or more often if you have foot problems. Report any cuts, sores, or bruises to your health care provider immediately. SEEK MEDICAL CARE IF:   You have an injury that is not healing.  You have cuts or breaks in the skin.  You have an ingrown nail.  You notice redness on your legs or feet.  You feel burning or tingling in your legs or feet.  You have pain or cramps in your legs and feet.  Your legs or feet are numb.  Your feet always feel cold. SEEK IMMEDIATE MEDICAL CARE IF:   There is increasing redness,   swelling, or pain in or around a wound.  There is a red line that goes up your leg.  Pus is coming from a wound.  You develop a fever or as directed by your health care provider.  You notice a bad smell coming from an ulcer or wound. Document Released: 02/05/2000 Document Revised: 10/10/2012 Document Reviewed: 07/17/2012 [employer] Patient Information 2014 [employer].  

## 2012-12-27 NOTE — Progress Notes (Signed)
Patient and his wife present today to pick up diabetic shoes. The diabetic shoes her fitted for him and he relates they are comfortable and free of defect. Patient was given instructions for wear and will seek be seen back for his routine care appointment in December.

## 2012-12-28 ENCOUNTER — Ambulatory Visit (INDEPENDENT_AMBULATORY_CARE_PROVIDER_SITE_OTHER): Payer: Medicare Other | Admitting: Endocrinology

## 2012-12-28 ENCOUNTER — Encounter: Payer: Self-pay | Admitting: Endocrinology

## 2012-12-28 VITALS — BP 112/60 | HR 60 | Temp 98.3°F | Ht 71.0 in | Wt 263.2 lb

## 2012-12-28 DIAGNOSIS — E119 Type 2 diabetes mellitus without complications: Secondary | ICD-10-CM

## 2012-12-28 NOTE — Progress Notes (Signed)
Subjective:    Patient ID: [name], male    DOB: 08/23/1935, 77 y.o.   MRN: [id number]  HPI Pt returns for f/u of insulin-requiring DM (dx'ed 1992, when he presented with polyuria; He has moderate neuropathy of the lower extremities, and associated PAD, and CAD; he has never had severe hypoglycemia or DKA).  pt states he feels well in general, except for back pain.  he brings a record of his cbg's which i have reviewed today.  It varies from 63-300.  It is lowest in the afternoon.  Otherwise, there is no trend throughout the day. Past Medical History  Diagnosis Date  . Diverticulosis   . Gastric antral vascular ectasia   . Rhinophyma   . Melanoma     resected by [facility] 2008  . CAD (coronary artery disease)     myoview 2007 no ischemia/ no ASA because of GI disease  . Atrial fibrillation     amiodarone in past, but problems and left off meds.  . Aortic stenosis     echo, September, 2010, mild aortic insufficiency  . Visual loss, one eye     right eye-ophthalmic arterial branch occlusion  . Internal hemorrhoids   . GERD (gastroesophageal reflux disease)   . Hypertension   . Allergic rhinitis   . Anemia     multifactorial  . Cirrhosis, alcoholic   . Thrombocytopenia   . Bleeding esophageal varices   . Encephalopathy, unspecified   . Gout   . Short-segment Barrett's esophagus     suspected  . Venous insufficiency   . Ejection fraction     EF 55%, echo, September, 2010  . Carotid bruit     Dopplers okay in the past  . Skin cancer     Basal cell and squamous cell  . Cellulitis     recurrent to RLE/notes 06/21/2012  . CHF (congestive heart failure)   . Sinus bradycardia     January, 2014  . Diastolic CHF     January, 2014  . Complication of anesthesia     "quit breathing once a long time ago; dropped my BP too; it was after I was given Dilaudid" (06/21/2012)  . Heart murmur   . Anginal pain   . Shortness of breath     "stay that way most of the time now" (06/21/2012)  .  OSA on CPAP   . Diabetes mellitus, type 2   . History of blood transfusion     "not related to OR" (06/21/2012)  . H/O hiatal hernia   . Osteoarthritis     "ALL OVER" (06/21/2012)  . Chronic lower back pain     Past Surgical History  Procedure Laterality Date  . Total knee arthroplasty Right   . Esophagogastroduodenoscopy  04/10/2008    esophageal varices, cardia varix  . Tips procedure  2/10  . Back surgery      X5 "for ruptured disc; last time did a fusion" (06/21/2012)  . Excisional hemorrhoidectomy    . Release scar contracture / graft repairs of hand      bilaterally   . Elbow surgery Right   . Hiatal hernia repair    . Colonoscopy w/ polypectomy  02/14/2007    adenomatous polyps, diverticulosis, internal hemorrhoids/rectal varices  . Cataract extraction, bilateral  January 2013  . Tonsillectomy    . Appendectomy    . Joint replacement    . Coronary artery bypass graft  1993    6 Bypass  .  Cataract extraction w/ intraocular lens  implant, bilateral      History   Social History  . Marital Status: Married    Spouse Name: N/A    Number of Children: N/A  . Years of Education: N/A   Occupational History  .      retired   Social History Main Topics  . Smoking status: Former Smoker -- 2.00 packs/day for 20 years    Types: Cigarettes    Quit date: 02/22/1975  . Smokeless tobacco: Never Used  . Alcohol Use: 0.0 oz/week     Comment: 06/21/2012 "none since Feb 2010"  . Drug Use: No  . Sexual Activity: No   Other Topics Concern  . Not on file   Social History Narrative   Patient does not get regular exercise   Quit smoking over 40 years ago    Current Outpatient Prescriptions on File Prior to Visit  Medication Sig Dispense Refill  . allopurinol (ZYLOPRIM) 300 MG tablet Take 150 mg by mouth daily.      . Ascorbic Acid (VITAMIN C) 500 MG tablet Take 500 mg by mouth daily.        . Calcium-Magnesium-Zinc (CALCIUM/MAGNESIUM/ZINC FORMULA) 1000-500-50 MG TABS Take 1  tablet by mouth daily.        . ferrous sulfate 325 (65 FE) MG tablet Take 325 mg by mouth 2 (two) times daily.        . furosemide (LASIX) 40 MG tablet Take 1 tablet (40 mg total) by mouth 2 (two) times daily.  180 tablet  1  . gabapentin (NEURONTIN) 300 MG capsule Take 1 capsule (300 mg total) by mouth 3 (three) times daily.  90 capsule  1  . glucose blood (FREESTYLE LITE) test strip 1 each by Other route 2 (two) times daily. And lancets: Dx code 250.01  100 each  5  . Insulin Syringe-Needle U-100 (INSULIN SYRINGE .5CC/31GX5/16") 31G X 5/16" 0.5 ML MISC Use one syringe 4 times daily. Dx code 250.01  120 each  6  . isosorbide mononitrate (IMDUR) 60 MG 24 hr tablet Take 1 tablet (60 mg total) by mouth daily.  90 tablet  0  . lactulose (CHRONULAC) 10 GM/15ML solution Take 30 mL (20 g total) by mouth 2 (two) times daily.  1892 mL  5  . loratadine (CLARITIN) 10 MG tablet Take 10 mg by mouth daily.      . metoprolol tartrate (LOPRESSOR) 25 MG tablet Take 12.5 mg by mouth 2 (two) times daily.      . nitroGLYCERIN (NITROSTAT) 0.4 MG SL tablet Place 1 tablet (0.4 mg total) under the tongue every 5 (five) minutes as needed for chest pain.  20 tablet  0  . pantoprazole (PROTONIX) 40 MG tablet Take 1 tablet (40 mg total) by mouth daily before breakfast.  30 tablet  11  . penicillin v potassium (VEETID) 250 MG tablet Take 250 mg by mouth 1 day or 1 dose. Once daily      . polyethylene glycol (MIRALAX / GLYCOLAX) packet Take 17 g by mouth daily as needed. Take two or three times a week      . predniSONE (DELTASONE) 5 MG tablet Take 5 mg by mouth daily.        . rifaximin (XIFAXAN) 550 MG TABS Take 1 tablet (550 mg total) by mouth 2 (two) times daily.  180 tablet  3  . spironolactone (ALDACTONE) 50 MG tablet Take 1 tablet (50 mg total) by mouth daily.  90 tablet  1  . Thiamine HCl (VITAMIN B-1) 250 MG tablet Take 125 mg by mouth daily.       . fentaNYL (DURAGESIC - DOSED MCG/HR) 50 MCG/HR Place 1 patch onto the  skin every 3 (three) days.      . nitroGLYCERIN (NITRODUR - DOSED IN MG/24 HR) 0.1 mg/hr patch Place 1 patch (0.1 mg total) onto the skin daily.  30 patch  2   No current facility-administered medications on file prior to visit.    Allergies  Allergen Reactions  . Shellfish-Derived Products     Rash & angioedema  . Amoxicillin-Pot Clavulanate     Unsure what reaction was but pt tolerated penicillin 05/25/2012  . Aspirin     REACTION: unable to tolerate due to cirrhosis and varices  . Buprenorphine Hcl-Naloxone Hcl Other (See Comments)    unresponsive  . Codeine     Rash    . Ezetimibe     REACTION: LEG/JOINT PAIN  . Hydromorphone Hcl     unknown  . Meperidine Hcl     unknown  . Morphine     unknown  . Morphine Sulfate     REACTION: unspecified  . Oxycodone     hallucinations  . Sulfonamide Derivatives     Family History  Problem Relation Age of Onset  . Alzheimer's disease Mother   . Cancer Father     GI (stomach?)  . Heart disease Sister     2 sisters  . Liver disease Sister   . Diabetes Brother   . Heart disease Brother     3 brothers  . Liver disease Brother   . Heart attack Other     sibling  . Melanoma Other     sibling  . Stroke Other     sibling   BP 112/60  Pulse 60  Temp(Src) 98.3 F (36.8 C) (Oral)  Ht 5\' 11"  (1.803 m)  Wt 263 lb 3.2 oz (119.387 kg)  BMI 36.73 kg/m2  SpO2 93%  Review of Systems Denies LOC and weight change.      Objective:   Physical Exam VITAL SIGNS:  See vs page GENERAL: no distress   Lab Results  Component Value Date   HGBA1C 7.6* 12/28/2012      Assessment & Plan:  DM: well-controlled.  This insulin regimen was chosen from multiple options, as it best matches his insulin to his changing requirements throughout the day.  The benefits of glycemic control must be weighed against the risks of hypoglycemia.  The pattern of his cbg's indicates he needs some reduced insulin, despite the elevated a1c. CAD: in this  context, he should avoid hypoglycemia.

## 2012-12-28 NOTE — Patient Instructions (Signed)
blood tests are being requested for you today.  We'll contact you with results. Please reduce the novolog to 30 units with breakfast, 20 units with lunch and 50 units with the evening meal. check your blood sugar 2 times a day.  vary the time of day when you check, between before the 3 meals, and at bedtime.  also check if you have symptoms of your blood sugar being too high or too low.  please keep a record of the readings and bring it to your next appointment here.  please call us sooner if you are having low blood sugar episodes.  It is very important to vary the time of day when you check, including at bedtime.   Please come back for a follow-up appointment in 3 months.

## 2012-12-31 ENCOUNTER — Ambulatory Visit: Payer: Medicare Other | Admitting: Internal Medicine

## 2012-12-31 ENCOUNTER — Ambulatory Visit: Payer: Medicare Other | Admitting: Endocrinology

## 2013-01-02 ENCOUNTER — Ambulatory Visit: Payer: Medicare Other | Admitting: Infectious Diseases

## 2013-01-04 ENCOUNTER — Other Ambulatory Visit: Payer: Self-pay | Admitting: Internal Medicine

## 2013-01-04 ENCOUNTER — Ambulatory Visit: Payer: Medicare Other | Admitting: Endocrinology

## 2013-01-04 NOTE — Telephone Encounter (Signed)
Metoprolol refilled.

## 2013-01-14 ENCOUNTER — Encounter: Payer: Self-pay | Admitting: Infectious Diseases

## 2013-01-14 ENCOUNTER — Ambulatory Visit (INDEPENDENT_AMBULATORY_CARE_PROVIDER_SITE_OTHER): Payer: Medicare Other | Admitting: Infectious Diseases

## 2013-01-14 VITALS — Temp 98.3°F | Ht 75.0 in | Wt 254.0 lb

## 2013-01-14 DIAGNOSIS — I890 Lymphedema, not elsewhere classified: Secondary | ICD-10-CM

## 2013-01-14 LAB — BASIC METABOLIC PANEL
Glucose, Bld: 141 mg/dL — ABNORMAL HIGH (ref 70–99)
Potassium: 4.2 mEq/L (ref 3.5–5.3)
Sodium: 141 mEq/L (ref 135–145)

## 2013-01-14 NOTE — Assessment & Plan Note (Signed)
He is doing well. I would continue his current tx (PEN, compression stockings and home devices, diuretics under the guidance of his PCP). Will check his K+ due to his cramping. See him back in 6 months.

## 2013-01-14 NOTE — Progress Notes (Signed)
  Subjective:    Patient ID: [name], male    DOB: 04-07-1935, 77 y.o.   MRN: [id number]  HPI 77 yo M with hx of DM2, CABG (RLE donor site), recurrent LE cullulitis, and alcoholic chirrhosis s/p tips.  Had 4 episodes of LE cellulitis 2010. He was seen in ID clinic for f/u and was started on oral Pen VK for prophylaxis of his recurrent episodes of cellulitis. He was given topical rx for his onychomycosis (terbinafine too high risk given his cirrhosis).  He was restarted on his PEN prophylaxis at his previous f/u in March. Was admitted in April 4-4 to 4-6 for fluid overload. He was again admitted 5-1 to 5-5 with RLE cellulitis (tx with vanco/zosyn, d/c on keflex/doxy x 7 days). His BCx were (-).  Has been doing better. Is using compression stockings, has been getting diuretic. No f/c. Still on PEN prophylaxis.  Has been having cramps in his fingers and his hands.  Last A1C was 7.7%.  Has seen Dr Myrtis Ser, Dr Everardo All. Has appt with Dr Leone Payor next week. Has not seen Dr Alwyn Ren recently. Saw Dr Christian Mate and had cortisone injection into his L wrist recently.  Had recent nuclear stress test to r/o anginal equivalent.   Review of Systems     Objective:   Physical Exam  Constitutional: He appears well-developed and well-nourished.  Musculoskeletal:       Arms:      Legs:         Assessment & Plan:

## 2013-01-19 ENCOUNTER — Other Ambulatory Visit: Payer: Self-pay | Admitting: Internal Medicine

## 2013-01-21 NOTE — Telephone Encounter (Signed)
Gabapentin refilled per protocol 

## 2013-01-22 ENCOUNTER — Other Ambulatory Visit (INDEPENDENT_AMBULATORY_CARE_PROVIDER_SITE_OTHER): Payer: Medicare Other

## 2013-01-22 ENCOUNTER — Other Ambulatory Visit: Payer: Self-pay

## 2013-01-22 ENCOUNTER — Ambulatory Visit (INDEPENDENT_AMBULATORY_CARE_PROVIDER_SITE_OTHER): Payer: Medicare Other | Admitting: Internal Medicine

## 2013-01-22 ENCOUNTER — Encounter: Payer: Self-pay | Admitting: Internal Medicine

## 2013-01-22 VITALS — BP 136/60 | HR 68 | Ht 75.0 in | Wt 252.0 lb

## 2013-01-22 DIAGNOSIS — K7030 Alcoholic cirrhosis of liver without ascites: Secondary | ICD-10-CM

## 2013-01-22 DIAGNOSIS — R1310 Dysphagia, unspecified: Secondary | ICD-10-CM

## 2013-01-22 DIAGNOSIS — D509 Iron deficiency anemia, unspecified: Secondary | ICD-10-CM

## 2013-01-22 DIAGNOSIS — K7290 Hepatic failure, unspecified without coma: Secondary | ICD-10-CM

## 2013-01-22 DIAGNOSIS — Z86010 Personal history of colonic polyps: Secondary | ICD-10-CM

## 2013-01-22 LAB — CBC WITH DIFFERENTIAL/PLATELET
Basophils Relative: 0.4 % (ref 0.0–3.0)
Eosinophils Relative: 0 % (ref 0.0–5.0)
HCT: 38.3 % — ABNORMAL LOW (ref 39.0–52.0)
Hemoglobin: 12.9 g/dL — ABNORMAL LOW (ref 13.0–17.0)
Lymphocytes Relative: 24.4 % (ref 12.0–46.0)
MCHC: 33.8 g/dL (ref 30.0–36.0)
Monocytes Relative: 8.8 % (ref 3.0–12.0)
Neutro Abs: 3.4 10*3/uL (ref 1.4–7.7)
Neutrophils Relative %: 66.4 % (ref 43.0–77.0)
RBC: 3.92 Mil/uL — ABNORMAL LOW (ref 4.22–5.81)
WBC: 5.1 10*3/uL (ref 4.5–10.5)

## 2013-01-22 LAB — PROTIME-INR
INR: 1.3 ratio — ABNORMAL HIGH (ref 0.8–1.0)
Prothrombin Time: 13.6 s — ABNORMAL HIGH (ref 10.2–12.4)

## 2013-01-22 MED ORDER — "INSULIN SYRINGE 31G X 5/16"" 0.5 ML MISC": Status: DC

## 2013-01-22 NOTE — Progress Notes (Signed)
Subjective:    Patient ID: [name], male    DOB: 1935/03/25, 77 y.o.   MRN: [id number]  HPI The patient is here with his wife for followup of alcoholic cirrhosis with a history of portal hypertension and variceal bleeding status post TIPS. He has a history of hepatic encephalopathy. He has chronic lower extremity edema. He has iron deficiency anemia. He has been having trouble with dysphagia over the past several months which is better but he says he is still having some swallowing difficulty.  When last year he was having left arm pain or minus angina, he was seen by cardiology, a stress perfusion test was negative. See review of systems for more details. 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 Overall his back pain may be a little bit better.  Allergies  Allergen Reactions  . Shellfish-Derived Products     Rash & angioedema  . Amoxicillin-Pot Clavulanate     Unsure what reaction was but pt tolerated penicillin 05/25/2012  . Aspirin     REACTION: unable to tolerate due to cirrhosis and varices  . Buprenorphine Hcl-Naloxone Hcl Other (See Comments)    unresponsive  . Codeine     Rash    . Ezetimibe     REACTION: LEG/JOINT PAIN  . Hydromorphone Hcl     unknown  . Meperidine Hcl     unknown  . Morphine     unknown  . Morphine Sulfate     REACTION: unspecified  . Oxycodone     hallucinations  . Sulfonamide Derivatives    Outpatient Prescriptions Prior to Visit  Medication Sig Dispense Refill  . allopurinol (ZYLOPRIM) 300 MG tablet Take 150 mg by mouth daily.      . Ascorbic Acid (VITAMIN C) 500 MG tablet Take 500 mg by mouth daily.        . Calcium-Magnesium-Zinc (CALCIUM/MAGNESIUM/ZINC FORMULA) 1000-500-50 MG TABS Take 1 tablet by mouth daily.        . fentaNYL (DURAGESIC - DOSED MCG/HR) 75 MCG/HR Place 75 mcg onto the skin every 3 (three) days.      . ferrous sulfate 325 (65 FE) MG tablet Take 325 mg by mouth 2 (two) times daily.        .  furosemide (LASIX) 40 MG tablet Take 1 tablet (40 mg total) by mouth 2 (two) times daily.  180 tablet  1  . gabapentin (NEURONTIN) 300 MG capsule TAKE ONE CAPSULE BY MOUTH THREE TIMES DAILY  90 capsule  1  . glucose blood (FREESTYLE LITE) test strip 1 each by Other route 2 (two) times daily. And lancets: Dx code 250.01  100 each  5  . insulin aspart (NOVOLOG) 100 UNIT/ML injection Inject 30 Units into the skin 3 (three) times daily before meals. 30 units with breakfast, 20 units with lunch and 50 units with the evening meal.      . isosorbide mononitrate (IMDUR) 60 MG 24 hr tablet Take 1 tablet (60 mg total) by mouth daily.  90 tablet  0  . lactulose (CHRONULAC) 10 GM/15ML solution Take 30 mL (20 g total) by mouth 2 (two) times daily.  1892 mL  5  . loratadine (CLARITIN) 10 MG tablet Take 10 mg by mouth daily.      . metoprolol tartrate (LOPRESSOR) 25 MG tablet TAKE ONE-HALF TABLET BY MOUTH TWICE DAILY  90 tablet  1  . nitroGLYCERIN (NITROSTAT) 0.4 MG SL tablet Place 1 tablet (0.4  mg total) under the tongue every 5 (five) minutes as needed for chest pain.  20 tablet  0  . pantoprazole (PROTONIX) 40 MG tablet Take 1 tablet (40 mg total) by mouth daily before breakfast.  30 tablet  11  . penicillin v potassium (VEETID) 250 MG tablet Take 250 mg by mouth 1 day or 1 dose. Once daily      . polyethylene glycol (MIRALAX / GLYCOLAX) packet Take 17 g by mouth daily as needed. Take two or three times a week      . predniSONE (DELTASONE) 5 MG tablet Take 5 mg by mouth daily.        . rifaximin (XIFAXAN) 550 MG TABS Take 1 tablet (550 mg total) by mouth 2 (two) times daily.  180 tablet  3  . spironolactone (ALDACTONE) 50 MG tablet Take 1 tablet (50 mg total) by mouth daily.  90 tablet  1  . Thiamine HCl (VITAMIN B-1) 250 MG tablet Take 125 mg by mouth daily.       Marland Kitchen tiZANidine (ZANAFLEX) 4 MG capsule Take 4 mg by mouth at bedtime.      . Insulin Syringe-Needle U-100 (INSULIN SYRINGE .5CC/31GX5/16") 31G X 5/16"  0.5 ML MISC Use one syringe 4 times daily. Dx code 250.01  120 each  6  . nitroGLYCERIN (NITRODUR - DOSED IN MG/24 HR) 0.1 mg/hr patch Place 1 patch (0.1 mg total) onto the skin daily.  30 patch  2   No facility-administered medications prior to visit.   Past Medical History  Diagnosis Date  . Diverticulosis   . Gastric antral vascular ectasia   . Rhinophyma   . Melanoma     resected by [facility] 2008  . CAD (coronary artery disease)     myoview 2007 no ischemia/ no ASA because of GI disease  . Atrial fibrillation     amiodarone in past, but problems and left off meds.  . Aortic stenosis     echo, September, 2010, mild aortic insufficiency  . Visual loss, one eye     right eye-ophthalmic arterial branch occlusion  . Internal hemorrhoids   . GERD (gastroesophageal reflux disease)   . Hypertension   . Allergic rhinitis   . Anemia     multifactorial  . Cirrhosis, alcoholic   . Thrombocytopenia   . Bleeding esophageal varices   . Encephalopathy, unspecified   . Gout   . Short-segment Barrett's esophagus     suspected  . Venous insufficiency   . Ejection fraction     EF 55%, echo, September, 2010  . Carotid bruit     Dopplers okay in the past  . Skin cancer     Basal cell and squamous cell  . Cellulitis     recurrent to RLE/notes 06/21/2012  . CHF (congestive heart failure)   . Sinus bradycardia     January, 2014  . Diastolic CHF     January, 2014  . Complication of anesthesia     "quit breathing once a long time ago; dropped my BP too; it was after I was given Dilaudid" (06/21/2012)  . Heart murmur   . Anginal pain   . Shortness of breath     "stay that way most of the time now" (06/21/2012)  . OSA on CPAP   . Diabetes mellitus, type 2   . History of blood transfusion     "not related to OR" (06/21/2012)  . H/O hiatal hernia   . Osteoarthritis     "  ALL OVER" (06/21/2012)  . Chronic lower back pain    Past Surgical History  Procedure Laterality Date  . Total knee  arthroplasty Right   . Esophagogastroduodenoscopy  04/10/2008    esophageal varices, cardia varix  . Tips procedure  2/10  . Back surgery      X5 "for ruptured disc; last time did a fusion" (06/21/2012)  . Excisional hemorrhoidectomy    . Release scar contracture / graft repairs of hand      bilaterally   . Elbow surgery Right   . Hiatal hernia repair    . Colonoscopy w/ polypectomy  02/14/2007    adenomatous polyps, diverticulosis, internal hemorrhoids/rectal varices  . Cataract extraction, bilateral  January 2013  . Tonsillectomy    . Appendectomy    . Joint replacement    . Coronary artery bypass graft  1993    6 Bypass  . Cataract extraction w/ intraocular lens  implant, bilateral     History   Social History  . Marital Status: Married    Spouse Name: N/A    Number of Children: N/A  . Years of Education: N/A   Occupational History  .      retired   Social History Main Topics  . Smoking status: Former Smoker -- 2.00 packs/day for 20 years    Types: Cigarettes    Quit date: 02/22/1975  . Smokeless tobacco: Never Used  . Alcohol Use: 0.0 oz/week     Comment: 06/21/2012 "none since Feb 2010"  . Drug Use: No  . Sexual Activity: No   Other Topics Concern  . None   Social History Narrative   Patient does not get regular exercise   Quit smoking over 40 years ago         Review of Systems Frontal headaches  Left arm and wrist pain "pinched nn" - brace on, injection, ? Surgery ([place])     Objective:   Physical Exam General:  NAD Eyes:   Anicteric, skin tag or something left lateral margin of left eye Nose:  Rhinophyma Lungs:  clear Heart:  S1S2 3/6 systolic murmur RUSB Abdomen:  soft and nontender, BS+, diastasis recti Ext:   ++ edema, compression hose, left wrist brace Neuro:  A and O x 3, no asterixis    Data Reviewed:  Infectious disease Notes, previous GI notes.    Assessment & Plan:   1. Dysphagia, unspecified(787.20)   2. CIRRHOSIS, ALCOHOLIC    3. ANEMIA, IRON DEFICIENCY

## 2013-01-22 NOTE — Assessment & Plan Note (Addendum)
Stable - doing well overall, considering his many problems. End of life discussed - has living will - would not want prolonged life support Last cross-sectional imaging April 2014. No suspicious lesions. Difficult to know whether it is appropriate to screen this patient for liver cancer given all of his comorbidities. Consider additional cross-sectional imaging after upper GI endoscopy.

## 2013-01-22 NOTE — Assessment & Plan Note (Addendum)
I have thought is not a good candidate for surveillance colonoscopy. He continues to surprise me with his longevity. His anemia seems to be reasonably treated, his ferritin is now normal. Await likely EGD, upper GI series his first. Consider possible colonoscopy depending upon overall situation.

## 2013-01-22 NOTE — Patient Instructions (Signed)
Your physician has requested that you go to the basement for the following lab work before leaving today: CBC/diff, PT/INR, Ferritin  You have been scheduled for an Upper GI Series at [facility]. Your appointment is on 01/28/13 at 9:30am. Please arrive 15 minutes prior to your test for registration. Make sure not to eat or drink anything after midnight on the night before your test. If you need to reschedule, please call radiology at [phone]. ________________________________________________________________ An upper GI series uses x rays to help diagnose problems of the upper GI tract, which includes the esophagus, stomach, and duodenum. The duodenum is the first part of the small intestine. An upper GI series is conducted by a radiology technologist or a radiologist-a doctor who specializes in x-ray imaging-at a hospital or outpatient center. While sitting or standing in front of an x-ray machine, the patient drinks barium liquid, which is often white and has a chalky consistency and taste. The barium liquid coats the lining of the upper GI tract and makes signs of disease show up more clearly on x rays. X-ray video, called fluoroscopy, is used to view the barium liquid moving through the esophagus, stomach, and duodenum. Additional x rays and fluoroscopy are performed while the patient lies on an x-ray table. To fully coat the upper GI tract with barium liquid, the technologist or radiologist may press on the abdomen or ask the patient to change position. Patients hold still in various positions, allowing the technologist or radiologist to take x rays of the upper GI tract at different angles. If a technologist conducts the upper GI series, a radiologist will later examine the images to look for problems.  This test typically takes about 1 hour to complete. __________________________________________________________________  I appreciate the opportunity to care for you.

## 2013-01-22 NOTE — Assessment & Plan Note (Addendum)
CBC today Ferritin Lab Results  Component Value Date   WBC 5.1 01/22/2013   HGB 12.9* 01/22/2013   HCT 38.3* 01/22/2013   MCV 97.6 01/22/2013   PLT 64.0* 01/22/2013   Lab Results  Component Value Date   FERRITIN 62.9 01/22/2013   Suspect a component of iron deficiency and chronic disease/portal hypertension anemia

## 2013-01-22 NOTE — Assessment & Plan Note (Addendum)
Better but still struggles UGIS to evaluate first this is noninvasive he has history of hiatal hernia repair. Possible EGD

## 2013-01-22 NOTE — Assessment & Plan Note (Signed)
Continue current regimen. No problems noted.

## 2013-01-22 NOTE — Telephone Encounter (Signed)
Insulin Syringe 7ml/ 31G refill and sent to [facility] pharmacy.

## 2013-01-28 ENCOUNTER — Ambulatory Visit (HOSPITAL_COMMUNITY)
Admission: RE | Admit: 2013-01-28 | Discharge: 2013-01-28 | Disposition: A | Discharge status: Home / Self Care | Payer: Medicare Other | Source: Ambulatory Visit | Attending: Internal Medicine | Admitting: Internal Medicine

## 2013-01-28 DIAGNOSIS — K2289 Other specified disease of esophagus: Secondary | ICD-10-CM | POA: Insufficient documentation

## 2013-01-28 DIAGNOSIS — K222 Esophageal obstruction: Secondary | ICD-10-CM | POA: Insufficient documentation

## 2013-01-28 DIAGNOSIS — K219 Gastro-esophageal reflux disease without esophagitis: Secondary | ICD-10-CM | POA: Insufficient documentation

## 2013-01-28 DIAGNOSIS — R1310 Dysphagia, unspecified: Secondary | ICD-10-CM | POA: Insufficient documentation

## 2013-01-28 DIAGNOSIS — K7460 Unspecified cirrhosis of liver: Secondary | ICD-10-CM | POA: Insufficient documentation

## 2013-01-28 DIAGNOSIS — K228 Other specified diseases of esophagus: Secondary | ICD-10-CM | POA: Insufficient documentation

## 2013-01-29 ENCOUNTER — Other Ambulatory Visit: Payer: Self-pay | Admitting: Dermatology

## 2013-01-31 ENCOUNTER — Ambulatory Visit: Payer: Self-pay | Admitting: Podiatrist

## 2013-01-31 NOTE — Progress Notes (Signed)
Quick Note:  Let them know needs another EGD to evaluate esophagus and possibly dilate We did it 03/2012 at [facility] I am double checking with Wilhemina Bonito to see if still ok for there and then we can schedule ______

## 2013-02-01 NOTE — Progress Notes (Signed)
Quick Note:  Schedule for EGD and dilation at [facility] please (Ok per Dr. Oretha Milch) ______

## 2013-02-04 ENCOUNTER — Ambulatory Visit (AMBULATORY_SURGERY_CENTER): Payer: Medicare Other

## 2013-02-04 VITALS — Ht 75.0 in | Wt 260.0 lb

## 2013-02-04 DIAGNOSIS — R1310 Dysphagia, unspecified: Secondary | ICD-10-CM

## 2013-02-07 ENCOUNTER — Ambulatory Visit (AMBULATORY_SURGERY_CENTER): Payer: Medicare Other | Admitting: Internal Medicine

## 2013-02-07 ENCOUNTER — Encounter: Payer: Self-pay | Admitting: Internal Medicine

## 2013-02-07 ENCOUNTER — Ambulatory Visit: Payer: Self-pay | Admitting: Podiatrist

## 2013-02-07 VITALS — BP 153/69 | HR 58 | Temp 98.3°F | Resp 20 | Ht 75.0 in | Wt 260.0 lb

## 2013-02-07 DIAGNOSIS — K222 Esophageal obstruction: Secondary | ICD-10-CM

## 2013-02-07 DIAGNOSIS — R1310 Dysphagia, unspecified: Secondary | ICD-10-CM

## 2013-02-07 LAB — GLUCOSE, CAPILLARY: Glucose-Capillary: 256 mg/dL — ABNORMAL HIGH (ref 70–99)

## 2013-02-07 MED ORDER — SODIUM CHLORIDE 0.9 % IV SOLN: 500.0000 mL | INTRAVENOUS | Status: DC

## 2013-02-07 NOTE — Progress Notes (Signed)
Called to room to assist during endoscopic procedure.  Patient ID and intended procedure confirmed with present staff. Received instructions for my participation in the procedure from the performing physician.  

## 2013-02-07 NOTE — Progress Notes (Signed)
Lidocaine-40mg IV prior to Propofol InductionPropofol given over incremental dosages 

## 2013-02-07 NOTE — Op Note (Signed)
[facility] [address] [place], [address]   ENDOSCOPY PROCEDURE REPORT  PATIENT: [name], [name]  MR#: #[id number] BIRTHDATE: Jun 27, 1935 , 77  yrs. old GENDER: Male ENDOSCOPIST: Iva Boop, MD, [facility] PROCEDURE DATE:  02/07/2013 PROCEDURE:  EGD with balloon dilation < 30 mm ASA CLASS:     Class III INDICATIONS:  Dysphagia. Stricture on barium study  Therapeutic procedure. MEDICATIONS: propofol (Diprivan) 150mg  IV, MAC sedation, administered by CRNA, and These medications were titrated to patient response per physician's verbal order TOPICAL ANESTHETIC: Cetacaine Spray  DESCRIPTION OF PROCEDURE: After the risks benefits and alternatives of the procedure were thoroughly explained, informed consent was obtained.  The LB [id number] endoscope was introduced through the mouth and advanced to the stomach body. Without limitations.  The instrument was slowly withdrawn as the mucosa was fully examined.        ESOPHAGUS: A stricture was found at the gastroesophageal junction. The stenosis was traversable with the endoscope.   It was dilated with an 18 mm balloon and was more patent. No heme.The esophagus was also tortuous.  STOMACH: Mild portal hypertensive gastropathy was found. Retroflexed views revealed no abnormalities.     The scope was then withdrawn from the patient and the procedure completed.  COMPLICATIONS: There were no complications. ENDOSCOPIC IMPRESSION: 1.   Stricture was found at the gastroesophageal junction - dilated to 18 mm 2.    Tortuous esophagus ? some dysmotility 2.   Portal hypertensive gastropathy was found  RECOMMENDATIONS: 1.  Clear liquids until 1200, then soft foods rest of day.  Resume prior diet tomorrow. 2.  Call back to office with an update re: swallowing in about 2 weeks (call back 12/29), may need further dilation 3.  we will also schedule a modified barium swallow to evaluate silent aspiration seen at upper  GI series   eSigned:  Iva Boop, MD, [facility] 02/07/2013 11:03 AM   CC:The Patient

## 2013-02-07 NOTE — Progress Notes (Signed)
Pt. Got total of fluid.

## 2013-02-07 NOTE — Patient Instructions (Signed)

## 2013-02-08 ENCOUNTER — Telehealth: Payer: Self-pay

## 2013-02-08 ENCOUNTER — Other Ambulatory Visit: Payer: Self-pay

## 2013-02-08 DIAGNOSIS — T17900A Unspecified foreign body in respiratory tract, part unspecified causing asphyxiation, initial encounter: Secondary | ICD-10-CM

## 2013-02-08 NOTE — Telephone Encounter (Signed)
  Follow up Call-  Call back number 02/07/2013 04/19/2012  Post procedure Call Back phone  # [phone]  Permission to leave phone message Yes Yes     Patient questions:  Do you have a fever, pain , or abdominal swelling? no Pain Score  0 *  Have you tolerated food without any problems? yes  Have you been able to return to your normal activities? yes  Do you have any questions about your discharge instructions: Diet   no Medications  no Follow up visit  no  Do you have questions or concerns about your Care? no  Actions: * If pain score is 4 or above: No action needed, pain <4.

## 2013-02-08 NOTE — Progress Notes (Signed)
Patient is scheduled for a MBS @ [facility] 02/25/13 11:00.  His wife [name] notified.

## 2013-02-12 ENCOUNTER — Other Ambulatory Visit (HOSPITAL_COMMUNITY): Payer: Self-pay | Admitting: Internal Medicine

## 2013-02-12 DIAGNOSIS — R1310 Dysphagia, unspecified: Secondary | ICD-10-CM

## 2013-02-18 ENCOUNTER — Telehealth: Payer: Self-pay | Admitting: Internal Medicine

## 2013-02-18 NOTE — Telephone Encounter (Signed)
Left message for patient to call back  

## 2013-02-19 NOTE — Telephone Encounter (Signed)
Do not think that is a problem, sometimes occurs after opening up the stricture  Please schedule late Jan/early Feb (approximate) f/u

## 2013-02-19 NOTE — Telephone Encounter (Signed)
Patient scheduled with wife for 03/20/13 11:00

## 2013-02-19 NOTE — Telephone Encounter (Signed)
Left message for patient to call back  

## 2013-02-19 NOTE — Telephone Encounter (Signed)
Patient's wife reports belching after eating and drinking, otherwise swallowing has improved.

## 2013-02-25 ENCOUNTER — Ambulatory Visit (HOSPITAL_COMMUNITY)
Admission: RE | Admit: 2013-02-25 | Discharge: 2013-02-25 | Disposition: A | Discharge status: Home / Self Care | Payer: Medicare Other | Source: Ambulatory Visit | Attending: Internal Medicine | Admitting: Internal Medicine

## 2013-02-25 DIAGNOSIS — R1313 Dysphagia, pharyngeal phase: Secondary | ICD-10-CM | POA: Insufficient documentation

## 2013-02-25 DIAGNOSIS — R1310 Dysphagia, unspecified: Secondary | ICD-10-CM

## 2013-02-25 DIAGNOSIS — T17900A Unspecified foreign body in respiratory tract, part unspecified causing asphyxiation, initial encounter: Secondary | ICD-10-CM

## 2013-02-25 DIAGNOSIS — E119 Type 2 diabetes mellitus without complications: Secondary | ICD-10-CM | POA: Insufficient documentation

## 2013-02-25 DIAGNOSIS — IMO0002 Reserved for concepts with insufficient information to code with codable children: Secondary | ICD-10-CM | POA: Insufficient documentation

## 2013-02-25 DIAGNOSIS — T17308A Unspecified foreign body in larynx causing other injury, initial encounter: Secondary | ICD-10-CM | POA: Insufficient documentation

## 2013-02-25 DIAGNOSIS — I509 Heart failure, unspecified: Secondary | ICD-10-CM | POA: Insufficient documentation

## 2013-02-25 DIAGNOSIS — K449 Diaphragmatic hernia without obstruction or gangrene: Secondary | ICD-10-CM | POA: Insufficient documentation

## 2013-02-25 DIAGNOSIS — K219 Gastro-esophageal reflux disease without esophagitis: Secondary | ICD-10-CM | POA: Insufficient documentation

## 2013-02-25 DIAGNOSIS — Z951 Presence of aortocoronary bypass graft: Secondary | ICD-10-CM | POA: Insufficient documentation

## 2013-02-25 NOTE — Procedures (Signed)
Objective Swallowing Evaluation: Modified Barium Swallowing Study  Patient Details  Name: [name] MRN: [id number] Date of Birth: February 06, 1936  Today's Date: 02/25/2013 Time: 1115-1140 SLP Time Calculation (min): 25 min  Past Medical History:  Past Medical History  Diagnosis Date  . Diverticulosis   . Gastric antral vascular ectasia   . Rhinophyma   . Melanoma     resected by [facility] 2008  . CAD (coronary artery disease)     myoview 2007 no ischemia/ no ASA because of GI disease  . Atrial fibrillation     amiodarone in past, but problems and left off meds.  . Aortic stenosis     echo, September, 2010, mild aortic insufficiency  . Visual loss, one eye     right eye-ophthalmic arterial branch occlusion  . Internal hemorrhoids   . GERD (gastroesophageal reflux disease)   . Hypertension   . Allergic rhinitis   . Anemia     multifactorial  . Cirrhosis, alcoholic   . Thrombocytopenia   . Bleeding esophageal varices   . Encephalopathy, unspecified   . Gout   . Short-segment Barrett's esophagus     suspected  . Venous insufficiency   . Ejection fraction     EF 55%, echo, September, 2010  . Carotid bruit     Dopplers okay in the past  . Skin cancer     Basal cell and squamous cell  . Cellulitis     recurrent to RLE/notes 06/21/2012  . CHF (congestive heart failure)   . Sinus bradycardia     January, 2014  . Diastolic CHF     January, 2014  . Complication of anesthesia     "quit breathing once a long time ago; dropped my BP too; it was after I was given Dilaudid" (06/21/2012)  . Heart murmur   . Anginal pain   . Shortness of breath     "stay that way most of the time now" (06/21/2012)  . OSA on CPAP   . Diabetes mellitus, type 2   . History of blood transfusion     "not related to OR" (06/21/2012)  . H/O hiatal hernia   . Osteoarthritis     "ALL OVER" (06/21/2012)  . Chronic lower back pain    Past Surgical History:  Past Surgical History  Procedure Laterality Date  .  Total knee arthroplasty Right   . Esophagogastroduodenoscopy  04/10/2008    esophageal varices, cardia varix  . Tips procedure  2/10  . Back surgery      X5 "for ruptured disc; last time did a fusion" (06/21/2012)  . Excisional hemorrhoidectomy    . Release scar contracture / graft repairs of hand      bilaterally   . Elbow surgery Right   . Hiatal hernia repair    . Colonoscopy w/ polypectomy  02/14/2007    adenomatous polyps, diverticulosis, internal hemorrhoids/rectal varices  . Cataract extraction, bilateral  January 2013  . Tonsillectomy    . Appendectomy    . Joint replacement    . Coronary artery bypass graft  1993    6 Bypass  . Cataract extraction w/ intraocular lens  implant, bilateral     HPI:  78 yr old seen for outpatient MBS following silent aspiration episode during recent upper GI.  Pt. states frequent coughing after meals and sensation of food in chest (pt. points to lower esophagus).  History of esophageal dilitation 2 weeks ago (may need further stretching per pt. report), back  surgery x 4, CABG, cirhossis, CHF, GERD, hiatal hernia, DM.  He denies having pna.     Assessment / Plan / Recommendation Clinical Impression  Dysphagia Diagnosis: Mild pharyngeal phase dysphagia Clinical impression: Pt. exhibited intermittent mild pharyngeal dysphagia when challenged to perform consecutive gulp sips resulting in layrngeal penetration.  Pt. did not sense penetration and majority of barium was ejected from vestibule during the swallow.  Mild vallecular and pyriform sinus residue present following liquid trials that cleared given verbal cues for second swallow.  Pharyngeal deficits may be the result of altered pressures from esophageal deficits.  MBS does not diagnose impairments below the level of the UES, however brief esopahgeal scan revealed what appeared to be mild stasis with reflux.  The barium pill lodged at the GE junction and not propelled with thin or puree textures.   Recommend continue regular texture diet and thin liquids with small, single controlled sips.  Educated on reflux precautions.        Treatment Recommendation  No treatment recommended at this time    Diet Recommendation Regular;Thin liquid   Liquid Administration via: Cup Medication Administration: Whole meds with liquid (or consider whole in puree ) Compensations: Slow rate;Small sips/bites;Multiple dry swallows after each bite/sip;Follow solids with liquid Postural Changes and/or Swallow Maneuvers: Seated upright 90 degrees;Upright 30-60 min after meal    Other  Recommendations Oral Care Recommendations: Oral care BID   Follow Up Recommendations  None    Frequency and Duration        Pertinent Vitals/Pain WDL            Reason for Referral Objectively evaluate swallowing function   Oral Phase Oral Preparation/Oral Phase Oral Phase: WFL   Pharyngeal Phase Pharyngeal Phase Pharyngeal Phase: Impaired Pharyngeal - Nectar Pharyngeal - Nectar Cup: Pharyngeal residue - valleculae;Pharyngeal residue - pyriform sinuses;Reduced tongue base retraction;Reduced laryngeal elevation Pharyngeal - Thin Pharyngeal - Thin Teaspoon: Not tested Pharyngeal - Thin Cup: Pharyngeal residue - valleculae;Pharyngeal residue - pyriform sinuses;Penetration/Aspiration during swallow;Reduced laryngeal elevation;Reduced tongue base retraction;Reduced airway/laryngeal closure (during consecutive gulping) Penetration/Aspiration details (thin cup): Material enters airway, remains ABOVE vocal cords then ejected out;Material enters airway, remains ABOVE vocal cords and not ejected out Pharyngeal - Solids Pharyngeal - Puree: Within functional limits Pharyngeal - Pill: Pharyngeal residue - valleculae;Pharyngeal residue - pyriform sinuses;Reduced tongue base retraction;Reduced laryngeal elevation  Cervical Esophageal Phase    GO    Cervical Esophageal Phase Cervical Esophageal Phase: [facility]    Functional  Assessment Tool Used: clinical judgement Functional Limitations: Swallowing Swallow Current Status (X4503): At least 20 percent but less than 40 percent impaired, limited or restricted Swallow Goal Status [phone]): At least 20 percent but less than 40 percent impaired, limited or restricted Swallow Discharge Status [id number]): At least 20 percent but less than 40 percent impaired, limited or restricted    Houston Siren M.Ed [employer] [phone]  02/25/2013

## 2013-02-26 ENCOUNTER — Ambulatory Visit (INDEPENDENT_AMBULATORY_CARE_PROVIDER_SITE_OTHER): Payer: Medicare Other | Admitting: Internal Medicine

## 2013-02-26 ENCOUNTER — Encounter: Payer: Self-pay | Admitting: Internal Medicine

## 2013-02-26 VITALS — BP 138/56 | HR 64 | Temp 98.6°F | Wt 256.0 lb

## 2013-02-26 DIAGNOSIS — J069 Acute upper respiratory infection, unspecified: Secondary | ICD-10-CM

## 2013-02-26 MED ORDER — AZITHROMYCIN 250 MG PO TABS: ORAL_TABLET | ORAL | Status: DC

## 2013-02-26 NOTE — Progress Notes (Signed)
Subjective:    Patient ID: [name], male    DOB: June 18, 1935, 78 y.o.   MRN: [id number]  HPI Acute visit Symptoms started yesterday, gradually: Cough, mild headache, postnasal dripping, small production of yellowish sputum. He is taking some Tylenol and Robitussin. He is concerned about influenza.  Past Medical History  Diagnosis Date  . Diverticulosis   . Gastric antral vascular ectasia   . Rhinophyma   . Melanoma     resected by [facility] 2008  . CAD (coronary artery disease)     myoview 2007 no ischemia/ no ASA because of GI disease  . Atrial fibrillation     amiodarone in past, but problems and left off meds.  . Aortic stenosis     echo, September, 2010, mild aortic insufficiency  . Visual loss, one eye     right eye-ophthalmic arterial branch occlusion  . Internal hemorrhoids   . GERD (gastroesophageal reflux disease)   . Hypertension   . Allergic rhinitis   . Anemia     multifactorial  . Cirrhosis, alcoholic   . Thrombocytopenia   . Bleeding esophageal varices   . Encephalopathy, unspecified   . Gout   . Short-segment Barrett's esophagus     suspected  . Venous insufficiency   . Ejection fraction     EF 55%, echo, September, 2010  . Carotid bruit     Dopplers okay in the past  . Skin cancer     Basal cell and squamous cell  . Cellulitis     recurrent to RLE/notes 06/21/2012  . CHF (congestive heart failure)   . Sinus bradycardia     January, 2014  . Diastolic CHF     January, 2014  . Complication of anesthesia     "quit breathing once a long time ago; dropped my BP too; it was after I was given Dilaudid" (06/21/2012)  . Heart murmur   . Anginal pain   . Shortness of breath     "stay that way most of the time now" (06/21/2012)  . OSA on CPAP   . Diabetes mellitus, type 2   . History of blood transfusion     "not related to OR" (06/21/2012)  . H/O hiatal hernia   . Osteoarthritis     "ALL OVER" (06/21/2012)  . Chronic lower back pain    Past Surgical  History  Procedure Laterality Date  . Total knee arthroplasty Right   . Esophagogastroduodenoscopy  04/10/2008    esophageal varices, cardia varix  . Tips procedure  2/10  . Back surgery      X5 "for ruptured disc; last time did a fusion" (06/21/2012)  . Excisional hemorrhoidectomy    . Release scar contracture / graft repairs of hand      bilaterally   . Elbow surgery Right   . Hiatal hernia repair    . Colonoscopy w/ polypectomy  02/14/2007    adenomatous polyps, diverticulosis, internal hemorrhoids/rectal varices  . Cataract extraction, bilateral  January 2013  . Tonsillectomy    . Appendectomy    . Joint replacement    . Coronary artery bypass graft  1993    6 Bypass  . Cataract extraction w/ intraocular lens  implant, bilateral       Review of Systems Reports low-grade fever, he did not actually take his temperature. No chills. Denies myalgias. No nausea or diarrhea but vomited once immediately after he took the liquid Robitussin. No hematemesis    Objective:  Physical Exam BP 138/56  Pulse 64  Temp(Src) 98.6 F (37 C)  Wt 256 lb (116.121 kg)  SpO2 96% General -- alert, well-developed, NAD.  HEENT-- Not pale. TMs normal, throat symmetric, no redness or discharge. Face symmetric, sinuses not tender to palpation. Nose slt congested.  Lungs -- normal respiratory effort, no intercostal retractions, no accessory muscle use, and normal breath sounds.  Heart-- normal rate, regular rhythm, III/IV  Systolic murmur.   Extremities-- + edema bilaterally , At baseline per patient Neurologic--  alert & oriented X3. Speech normal, gait normal, strength normal in all extremities.   Psych--   No anxious or depressed appearing.      Assessment & Plan:  URI, Patient is concerned about influenza however symptoms, consistent with a URI. I recommend conservative treatment , wife who is here today   reports that he is known to get very sick quickly consequently I provided a zpack to be  used if he is not improving in the next 24-48 hours. See instructions

## 2013-02-26 NOTE — Patient Instructions (Signed)
Rest, fluids , tylenol For cough, take Mucinex DM 1/2 tablet twice a day as needed  For congestion use OTC Nasocort: 2 nasal sprays on each side of the nose daily until you feel better  Take the antibiotic as prescribed  (zithromax) if not better in the next 34 to 48 hours Call if no better in few days Call anytime if the symptoms become severe, you have high fever, short of breath, chest pain

## 2013-02-26 NOTE — Progress Notes (Signed)
Pre visit review using our clinic review tool, if applicable. No additional management support is needed unless otherwise documented below in the visit note. 

## 2013-02-27 NOTE — Progress Notes (Signed)
Quick Note:  Ask if they understand results of MBS (basically ok) Ask how swallowing is going? ______

## 2013-03-01 NOTE — Addendum Note (Signed)
Addended by: Lowry Ram on: 03/01/2013 01:37 PM   Modules accepted: Level of Service

## 2013-03-07 ENCOUNTER — Encounter: Payer: Self-pay | Admitting: Podiatrist

## 2013-03-07 ENCOUNTER — Ambulatory Visit (INDEPENDENT_AMBULATORY_CARE_PROVIDER_SITE_OTHER): Payer: Medicare Other | Admitting: Podiatrist

## 2013-03-07 VITALS — BP 128/61 | HR 62 | Resp 16 | Ht 75.0 in | Wt 250.0 lb

## 2013-03-07 DIAGNOSIS — B351 Tinea unguium: Secondary | ICD-10-CM

## 2013-03-07 DIAGNOSIS — M722 Plantar fascial fibromatosis: Secondary | ICD-10-CM

## 2013-03-07 DIAGNOSIS — E1151 Type 2 diabetes mellitus with diabetic peripheral angiopathy without gangrene: Secondary | ICD-10-CM

## 2013-03-07 DIAGNOSIS — E1159 Type 2 diabetes mellitus with other circulatory complications: Secondary | ICD-10-CM

## 2013-03-07 DIAGNOSIS — M79609 Pain in unspecified limb: Secondary | ICD-10-CM

## 2013-03-07 MED ORDER — TRIAMCINOLONE ACETONIDE 10 MG/ML IJ SUSP
10.0000 mg | Freq: Once | INTRAMUSCULAR | Status: AC
  Administered 2013-03-07: 10 mg

## 2013-03-07 NOTE — Progress Notes (Signed)
HPI:  Patient presents today for follow up of foot and nail care. He has baseline peripheral edema he's been using a pump and in the past he has had ulcers on bilateral heels requiring skin grafts which was performed at the wound center in [place]. Overall the swelling is much improved from using the pump but he does still have edema bilaterally and has had multiple bouts with cellulitis requiring hospitalization. Patient complains of pain and bilateral heels right greater than left. Relates pain with first step in the morning.  Objective:  Patients chart is reviewed.  Neurovascular status unchanged pedal pulses are palpable in lower [place] me edema is present..  Patients nails are thickened, discolored, distrophic, friable and brittle with yellow-brown discoloration. Patient subjectively relates they are painful with shoes and with ambulation of bilateral feet. Pain on palpation plantar medial aspect of the right heel at the insertion of the medial calcaneal tubercle is present. Left heel symptomatic but not as significant  Assessment:  Symptomatic onychomycosis, plantar fasciitis right, diabetes with peripheral angiopathy  Plan:  Discussed treatment options and alternatives.  The symptomatic toenails were debrided through manual an mechanical means without complication.  Discussed a steroid injection and the patient agreed a sterile prep was performed and Kenalog and Marcaine mixture was infiltrated into the right heel. Patient was instructed to watch his blood sugar as it may be elevated due to the injection. Return appointment recommended at routine intervals of 3 months    Trudie Buckler, DPM

## 2013-03-16 ENCOUNTER — Other Ambulatory Visit: Payer: Self-pay | Admitting: Internal Medicine

## 2013-03-17 ENCOUNTER — Other Ambulatory Visit: Payer: Self-pay | Admitting: Cardiology

## 2013-03-18 NOTE — Telephone Encounter (Signed)
OK to refill for 1 year. 

## 2013-03-18 NOTE — Telephone Encounter (Signed)
Please advise regarding refill, thank you Sir.

## 2013-03-20 ENCOUNTER — Encounter: Payer: Self-pay | Admitting: Internal Medicine

## 2013-03-20 ENCOUNTER — Other Ambulatory Visit (INDEPENDENT_AMBULATORY_CARE_PROVIDER_SITE_OTHER): Payer: Medicare Other

## 2013-03-20 ENCOUNTER — Ambulatory Visit (INDEPENDENT_AMBULATORY_CARE_PROVIDER_SITE_OTHER): Payer: Medicare Other | Admitting: Internal Medicine

## 2013-03-20 VITALS — BP 124/64 | HR 60 | Ht 75.0 in | Wt 249.0 lb

## 2013-03-20 DIAGNOSIS — R1310 Dysphagia, unspecified: Secondary | ICD-10-CM

## 2013-03-20 DIAGNOSIS — L97929 Non-pressure chronic ulcer of unspecified part of left lower leg with unspecified severity: Secondary | ICD-10-CM

## 2013-03-20 DIAGNOSIS — K7030 Alcoholic cirrhosis of liver without ascites: Secondary | ICD-10-CM

## 2013-03-20 DIAGNOSIS — R188 Other ascites: Secondary | ICD-10-CM

## 2013-03-20 DIAGNOSIS — L97909 Non-pressure chronic ulcer of unspecified part of unspecified lower leg with unspecified severity: Secondary | ICD-10-CM

## 2013-03-20 DIAGNOSIS — T17908A Unspecified foreign body in respiratory tract, part unspecified causing other injury, initial encounter: Secondary | ICD-10-CM

## 2013-03-20 HISTORY — DX: Unspecified foreign body in respiratory tract, part unspecified causing other injury, initial encounter: T17.908A

## 2013-03-20 LAB — COMPREHENSIVE METABOLIC PANEL
ALT: 13 U/L (ref 0–53)
AST: 24 U/L (ref 0–37)
Albumin: 3.4 g/dL — ABNORMAL LOW (ref 3.5–5.2)
Alkaline Phosphatase: 54 U/L (ref 39–117)
BUN: 18 mg/dL (ref 6–23)
CALCIUM: 9 mg/dL (ref 8.4–10.5)
CHLORIDE: 103 meq/L (ref 96–112)
CO2: 30 mEq/L (ref 19–32)
Creatinine, Ser: 1.2 mg/dL (ref 0.4–1.5)
GFR: 63.93 mL/min (ref >60.00)
Glucose, Bld: 177 mg/dL — ABNORMAL HIGH (ref 70–99)
Potassium: 3.8 mEq/L (ref 3.5–5.1)
SODIUM: 139 meq/L (ref 135–145)
TOTAL PROTEIN: 6.2 g/dL (ref 6.0–8.3)
Total Bilirubin: 1.5 mg/dL — ABNORMAL HIGH (ref 0.3–1.2)

## 2013-03-20 NOTE — Patient Instructions (Addendum)
You have been scheduled for an appointment at the wound clinic on April 02, 2013 @ 1:00 pm. Their office is located at [address] (next to [facility]) on the 3rd floor.    Your physician has requested that you go to the basement for the following lab work before leaving today: CMET  You have been scheduled for a CT scan of the abdomen and pelvis at [facility] ([address]---this is in the same building as Press photographer).   You are scheduled on 03/21/13 at 3:00pm. You should arrive 15 minutes prior to your appointment time for registration. Please follow the written instructions below on the day of your exam:  1) You may take any medications as prescribed with a small amount of water except for the following: Metformin, Glucophage, Glucovance, Avandamet, Riomet, Fortamet, Actoplus Met, Janumet, Glumetza or Metaglip. The above medications must be held the day of the exam AND 48 hours after the exam.  This test typically takes 30-45 minutes to complete.  If you have any questions regarding your exam or if you need to reschedule, you may call the CT department at [phone] between the hours of 8:00 am and 5:00 pm, Monday-Friday.  ________________________________________________________________________  I appreciate the opportunity to care for you.

## 2013-03-20 NOTE — Assessment & Plan Note (Signed)
CT to recheck

## 2013-03-20 NOTE — Assessment & Plan Note (Signed)
He is modifying how he drinks, taking small sips to minimize the risks here.

## 2013-03-20 NOTE — Assessment & Plan Note (Signed)
Cord her size superficial ulcer with a smaller ulcer around it on the lateral aspect of the mid left leg. This arose from some trauma. His wife says it's healing slowly if at all. With his history of cellulitis in slow healing issues I think a look at it by the wound clinic is warranted so we'll send him back there.

## 2013-03-20 NOTE — Assessment & Plan Note (Addendum)
Better ? More dilation was not clear to me. I think he has some dysmotility. I dilated up to 18 mm after 13 mm tablet stopped at the GE junction upper GI series last month. He certainly could tolerate another procedure but if we can avoid that I think that that would be best.

## 2013-03-20 NOTE — Progress Notes (Signed)
    Subjective:    Patient ID: [name], male    DOB: 03-Jan-1936, 78 y.o.   MRN: [id number]  HPI Patient is here after I dilated his GE junction stenosis stricture area up to 18 mm last month. He is swallowing better though he still having regurgitation problems. His wife had called back and I thought that that was new since the dilation of the sounds like that with him going on, with liquids regurgitating backup at times without frank vomiting or egg was exiting oral cavity after he drinks. Some regurgitation after eating. His swallowing is improved overall. He is not gaining weight or having increasing abdominal girth. Denies abdominal pain. He struck his left leg on something and has an ulcer that his wife is worried about him once me to look at. He is taking small sips of fluid to try to minimize the risk of his silent aspiration detected on modified barium swallow. Medications, allergies, past medical history, past surgical history, family history and social history are reviewed and updated in the EMR.   Review of Systems As above, he denies anhedonia, excessive somnolence, changes in mood    Objective:   Physical Exam Elderly, chronically ill white man in no acute distress He is alert and oriented x3  extremities show trace edema, right lower extremity is in good shape in the leg area, did not look at the foot. The left lower extremity in the leg area has a quarter-sized ulcer with surrounding erythema but is clean without signs of infection in the mid lateral left leg.       Assessment & Plan:   1. CIRRHOSIS, ALCOHOLIC   2.  ASCITES   3. Leg ulcer, left   4. Dysphagia, unspecified(787.20)   5. Aspiration into airway-silent on modified barium swallow

## 2013-03-20 NOTE — Assessment & Plan Note (Addendum)
Stable overall it seems. With his upper regurgitation symptoms etc. I wonder if he has never current ascites though his weight is not increased, the other possibility is he could have his liver compressing his stomach some versus a neuropathic or functional disturbance. Will order a CT of the abdomen. He has a shellfish allergy, he has not had a contrast allergy problem abdominal where a but he is concerned about this potential so we'll do the scan without contrast do think it'll provide information I need.

## 2013-03-21 ENCOUNTER — Ambulatory Visit (INDEPENDENT_AMBULATORY_CARE_PROVIDER_SITE_OTHER)
Admission: RE | Admit: 2013-03-21 | Discharge: 2013-03-21 | Disposition: A | Discharge status: Home / Self Care | Payer: Medicare Other | Source: Ambulatory Visit | Attending: Internal Medicine | Admitting: Internal Medicine

## 2013-03-21 ENCOUNTER — Other Ambulatory Visit: Payer: Medicare Other

## 2013-03-21 DIAGNOSIS — K7030 Alcoholic cirrhosis of liver without ascites: Secondary | ICD-10-CM

## 2013-03-21 DIAGNOSIS — R188 Other ascites: Secondary | ICD-10-CM

## 2013-03-21 NOTE — Progress Notes (Signed)
Quick Note:  Two areas in liver that unfortunately could be cancer - overall probably not but should have an MRI liver without and with contrast to check this better ______

## 2013-03-22 ENCOUNTER — Other Ambulatory Visit: Payer: Self-pay | Admitting: Internal Medicine

## 2013-03-22 DIAGNOSIS — K7460 Unspecified cirrhosis of liver: Secondary | ICD-10-CM

## 2013-03-22 DIAGNOSIS — R935 Abnormal findings on diagnostic imaging of other abdominal regions, including retroperitoneum: Secondary | ICD-10-CM

## 2013-03-22 NOTE — Progress Notes (Signed)
MRI liver with and without contrast appointment made for February 11th at 1pm at [facility].  Need to arrive 12:45pm.  Larence Penning, [facility] to notify patient of appointment when she speaks to them about the CT results. If they need to change this appointment they can call # [phone] per Sharrie Rothman in Radiology.

## 2013-03-29 ENCOUNTER — Other Ambulatory Visit: Payer: Self-pay | Admitting: Cardiology

## 2013-03-29 ENCOUNTER — Other Ambulatory Visit: Payer: Self-pay | Admitting: Licensed Clinical Social Worker

## 2013-03-29 ENCOUNTER — Other Ambulatory Visit: Payer: Self-pay

## 2013-03-29 DIAGNOSIS — L0390 Cellulitis, unspecified: Secondary | ICD-10-CM

## 2013-03-29 MED ORDER — FUROSEMIDE 40 MG PO TABS: 40.0000 mg | ORAL_TABLET | Freq: Two times a day (BID) | ORAL | Status: DC

## 2013-03-29 MED ORDER — SPIRONOLACTONE 50 MG PO TABS: 50.0000 mg | ORAL_TABLET | Freq: Every day | ORAL | Status: DC

## 2013-03-29 MED ORDER — PENICILLIN V POTASSIUM 250 MG PO TABS: 250.0000 mg | ORAL_TABLET | ORAL | Status: DC

## 2013-03-29 MED ORDER — LACTULOSE 10 GM/15ML PO SOLN: 20.0000 g | Freq: Two times a day (BID) | ORAL | Status: DC

## 2013-03-29 MED ORDER — ISOSORBIDE MONONITRATE ER 60 MG PO TB24: ORAL_TABLET | ORAL | Status: DC

## 2013-03-29 NOTE — Telephone Encounter (Signed)
6 months of refills ok'ed per Dr. Carlean Purl.

## 2013-04-01 ENCOUNTER — Ambulatory Visit (INDEPENDENT_AMBULATORY_CARE_PROVIDER_SITE_OTHER): Payer: Medicare Other | Admitting: Endocrinology

## 2013-04-01 ENCOUNTER — Encounter: Payer: Self-pay | Admitting: Endocrinology

## 2013-04-01 VITALS — BP 123/56 | HR 65 | Temp 98.7°F | Ht 75.0 in | Wt 254.0 lb

## 2013-04-01 DIAGNOSIS — E1159 Type 2 diabetes mellitus with other circulatory complications: Secondary | ICD-10-CM

## 2013-04-01 DIAGNOSIS — E1151 Type 2 diabetes mellitus with diabetic peripheral angiopathy without gangrene: Secondary | ICD-10-CM

## 2013-04-01 LAB — HEMOGLOBIN A1C
Hgb A1c MFr Bld: 6.9 % — ABNORMAL HIGH (ref <5.7)
Mean Plasma Glucose: 151 mg/dL — ABNORMAL HIGH (ref <117)

## 2013-04-01 NOTE — Patient Instructions (Addendum)
blood tests are being requested for you today.  We'll contact you with results. Please reduce the novolog to 25 units with breakfast, 20 units with lunch and 50 units with the evening meal.  Also, if you eat at bedtime, take 5 units then. check your blood sugar 2 times a day.  vary the time of day when you check, between before the 3 meals, and at bedtime.  also check if you have symptoms of your blood sugar being too high or too low.  please keep a record of the readings and bring it to your next appointment here.  please call us sooner if you are having low blood sugar episodes.  It is very important to vary the time of day when you check, including at bedtime.   Please come back for a follow-up appointment in 3 months.

## 2013-04-01 NOTE — Progress Notes (Signed)
Subjective:    Patient ID: [name], male    DOB: June 07, 1935, 78 y.o.   MRN: [id number]  HPI Pt returns for f/u of insulin-requiring DM (dx'ed 1992, when he presented with polyuria; He has moderate neuropathy of the lower extremities, and associated PAD, and CAD; he has never had severe hypoglycemia or DKA; he takes TID novolog; the pattern of his cbg's says he does not need basal insulin).  pt states he feels well in general, except for back pain.  he brings a record of his cbg's (checked mostly in am) which i have reviewed today.  It varies from 57-200's.  It is lowest at lunch.  He often misses lunch (and lunch insulin).  However, he sometimes eats a bedtime snack.   Past Medical History  Diagnosis Date  . Diverticulosis   . Gastric antral vascular ectasia   . Rhinophyma   . Melanoma     resected by [facility] 2008  . CAD (coronary artery disease)     myoview 2007 no ischemia/ no ASA because of GI disease  . Atrial fibrillation     amiodarone in past, but problems and left off meds.  . Aortic stenosis     echo, September, 2010, mild aortic insufficiency  . Visual loss, one eye     right eye-ophthalmic arterial branch occlusion  . Internal hemorrhoids   . GERD (gastroesophageal reflux disease)   . Hypertension   . Allergic rhinitis   . Anemia     multifactorial  . Cirrhosis, alcoholic   . Thrombocytopenia   . Bleeding esophageal varices   . Encephalopathy, unspecified   . Gout   . Short-segment Barrett's esophagus     suspected  . Venous insufficiency   . Ejection fraction     EF 55%, echo, September, 2010  . Carotid bruit     Dopplers okay in the past  . Skin cancer     Basal cell and squamous cell  . Cellulitis     recurrent to RLE/notes 06/21/2012  . CHF (congestive heart failure)   . Sinus bradycardia     January, 2014  . Diastolic CHF     January, 2014  . Complication of anesthesia     "quit breathing once a long time ago; dropped my BP too; it was after I was  given Dilaudid" (06/21/2012)  . Heart murmur   . Anginal pain   . Shortness of breath     "stay that way most of the time now" (06/21/2012)  . OSA on CPAP   . Diabetes mellitus, type 2   . History of blood transfusion     "not related to OR" (06/21/2012)  . H/O hiatal hernia   . Osteoarthritis     "ALL OVER" (06/21/2012)  . Chronic lower back pain   . Aspiration into airway-silent on modified barium swallow 03/20/2013    Past Surgical History  Procedure Laterality Date  . Total knee arthroplasty Right   . Esophagogastroduodenoscopy  04/10/2008    esophageal varices, cardia varix  . Tips procedure  2/10  . Back surgery      X5 "for ruptured disc; last time did a fusion" (06/21/2012)  . Excisional hemorrhoidectomy    . Release scar contracture / graft repairs of hand      bilaterally   . Elbow surgery Right   . Hiatal hernia repair    . Colonoscopy w/ polypectomy  02/14/2007    adenomatous polyps, diverticulosis, internal hemorrhoids/rectal varices  .  Cataract extraction, bilateral  January 2013  . Tonsillectomy    . Appendectomy    . Joint replacement    . Coronary artery bypass graft  1993    6 Bypass  . Cataract extraction w/ intraocular lens  implant, bilateral      History   Social History  . Marital Status: Married    Spouse Name: N/A    Number of Children: N/A  . Years of Education: N/A   Occupational History  .      retired   Social History Main Topics  . Smoking status: Former Smoker -- 2.00 packs/day for 20 years    Types: Cigarettes    Quit date: 02/22/1975  . Smokeless tobacco: Never Used  . Alcohol Use: No     Comment: 06/21/2012 "none since Feb 2010"  . Drug Use: No  . Sexual Activity: No   Other Topics Concern  . Not on file   Social History Narrative   Patient does not get regular exercise   Quit smoking over 40 years ago    Current Outpatient Prescriptions on File Prior to Visit  Medication Sig Dispense Refill  . allopurinol (ZYLOPRIM) 300 MG  tablet Take 150 mg by mouth daily.      . Ascorbic Acid (VITAMIN C) 500 MG tablet Take 500 mg by mouth daily.        . Calcium-Magnesium-Zinc (CALCIUM/MAGNESIUM/ZINC FORMULA) 1000-500-50 MG TABS Take 1 tablet by mouth daily.        . fentaNYL (DURAGESIC - DOSED MCG/HR) 75 MCG/HR Place 75 mcg onto the skin every 3 (three) days.      . ferrous sulfate 325 (65 FE) MG tablet Take 325 mg by mouth 2 (two) times daily.        . furosemide (LASIX) 40 MG tablet Take 1 tablet (40 mg total) by mouth 2 (two) times daily.  180 tablet  1  . gabapentin (NEURONTIN) 300 MG capsule TAKE ONE CAPSULE BY MOUTH THREE TIMES DAILY  90 capsule  1  . glucose blood (FREESTYLE LITE) test strip 1 each by Other route 2 (two) times daily. And lancets: Dx code 250.01  100 each  5  . insulin aspart (NOVOLOG) 100 UNIT/ML injection Inject 30 Units into the skin 3 (three) times daily before meals. 25 units with breakfast, 20 units with lunch and 50 units with the evening meal.      . Insulin Syringe-Needle U-100 (INSULIN SYRINGE .5CC/31GX5/16") 31G X 5/16" 0.5 ML MISC Use one syringe 4 times daily. Dx code 250.01  120 each  6  . isosorbide mononitrate (IMDUR) 60 MG 24 hr tablet TAKE ONE TABLET BY MOUTH ONCE DAILY  90 tablet  0  . lactulose (CHRONULAC) 10 GM/15ML solution Take 30 mLs (20 g total) by mouth 2 (two) times daily.  1892 mL  5  . loratadine (CLARITIN) 10 MG tablet Take 10 mg by mouth daily.      . metoprolol tartrate (LOPRESSOR) 25 MG tablet TAKE ONE-HALF TABLET BY MOUTH TWICE DAILY  90 tablet  1  . nitroGLYCERIN (NITROSTAT) 0.4 MG SL tablet Place 1 tablet (0.4 mg total) under the tongue every 5 (five) minutes as needed for chest pain.  20 tablet  0  . pantoprazole (PROTONIX) 40 MG tablet Take 1 tablet (40 mg total) by mouth daily before breakfast.  30 tablet  11  . penicillin v potassium (VEETID) 250 MG tablet Take 1 tablet (250 mg total) by mouth 1 day or 1  dose. Once daily  30 tablet  3  . polyethylene glycol (MIRALAX /  GLYCOLAX) packet Take 17 g by mouth daily as needed. Take two or three times a week      . predniSONE (DELTASONE) 5 MG tablet Take 5 mg by mouth daily.        Marland Kitchen spironolactone (ALDACTONE) 50 MG tablet Take 1 tablet (50 mg total) by mouth daily.  90 tablet  1  . Thiamine HCl (VITAMIN B-1) 250 MG tablet Take 125 mg by mouth daily.       Marland Kitchen tiZANidine (ZANAFLEX) 4 MG capsule Take 4 mg by mouth at bedtime.      Marland Kitchen XIFAXAN 550 MG TABS tablet TAKE 1 TABLET BY MOUTH 2 TIMES A DAYH  180 tablet  3   No current facility-administered medications on file prior to visit.    Allergies  Allergen Reactions  . Shellfish-Derived Products     Rash & angioedema  . Amoxicillin-Pot Clavulanate     Unsure what reaction was but pt tolerated penicillin 05/25/2012  . Aspirin     REACTION: unable to tolerate due to cirrhosis and varices  . Buprenorphine Hcl-Naloxone Hcl Other (See Comments)    unresponsive  . Codeine     Rash    . Ezetimibe     REACTION: LEG/JOINT PAIN  . Hydromorphone Hcl     unknown  . Meperidine Hcl     unknown  . Morphine     unknown  . Morphine Sulfate     REACTION: unspecified  . Oxycodone     hallucinations  . Sulfonamide Derivatives     Family History  Problem Relation Age of Onset  . Alzheimer's disease Mother   . Cancer Father     GI (stomach?)  . Heart disease Sister     2 sisters  . Liver disease Sister   . Diabetes Brother   . Heart disease Brother     3 brothers  . Liver disease Brother   . Heart attack Other     sibling  . Melanoma Other     sibling  . Stroke Other     sibling  . Colon cancer Neg Hx    BP 123/56  Pulse 65  Temp(Src) 98.7 F (37.1 C) (Oral)  Ht 6\' 3"  (1.905 m)  Wt 254 lb (115.214 kg)  BMI 31.75 kg/m2  SpO2 96%  Review of Systems Denies LOC.  He has lost a few lbs.     Objective:   Physical Exam VITAL SIGNS:  See vs page GENERAL: no distress  Lab Results  Component Value Date   HGBA1C 7.6* 12/28/2012      Assessment &  Plan:  DM: This insulin regimen was chosen from multiple options, as it best matches his insulin to his changing requirements throughout the day.  The benefits of glycemic control must be weighed against the risks of hypoglycemia.  The pattern of his cbg's indicates he needs some adjustment in his therapy CAD: in this context, he should avoid hypoglycemia.   Weight loss: this helps DM control.

## 2013-04-02 ENCOUNTER — Encounter (HOSPITAL_BASED_OUTPATIENT_CLINIC_OR_DEPARTMENT_OTHER): Payer: Medicare Other

## 2013-04-03 ENCOUNTER — Ambulatory Visit (HOSPITAL_COMMUNITY)
Admission: RE | Admit: 2013-04-03 | Discharge: 2013-04-03 | Disposition: A | Discharge status: Home / Self Care | Payer: Medicare Other | Source: Ambulatory Visit | Attending: Internal Medicine | Admitting: Internal Medicine

## 2013-04-03 ENCOUNTER — Encounter (HOSPITAL_COMMUNITY): Payer: Self-pay

## 2013-04-03 DIAGNOSIS — K7689 Other specified diseases of liver: Secondary | ICD-10-CM | POA: Insufficient documentation

## 2013-04-03 DIAGNOSIS — K7460 Unspecified cirrhosis of liver: Secondary | ICD-10-CM | POA: Insufficient documentation

## 2013-04-03 DIAGNOSIS — K766 Portal hypertension: Secondary | ICD-10-CM | POA: Insufficient documentation

## 2013-04-03 DIAGNOSIS — R935 Abnormal findings on diagnostic imaging of other abdominal regions, including retroperitoneum: Secondary | ICD-10-CM

## 2013-04-03 MED ORDER — GADOXETATE DISODIUM 0.25 MMOL/ML IV SOLN
9.0000 mL | Freq: Once | INTRAVENOUS | Status: AC | PRN
  Administered 2013-04-03: 9 mL via INTRAVENOUS

## 2013-04-05 ENCOUNTER — Other Ambulatory Visit: Payer: Medicare Other

## 2013-04-05 ENCOUNTER — Other Ambulatory Visit: Payer: Self-pay

## 2013-04-05 ENCOUNTER — Telehealth: Payer: Self-pay | Admitting: Internal Medicine

## 2013-04-05 DIAGNOSIS — K7460 Unspecified cirrhosis of liver: Secondary | ICD-10-CM

## 2013-04-05 NOTE — Progress Notes (Signed)
Quick Note:  I called results that suggest carcinoma of the liver Will get an AFP and bring to cancer conference to review  ______

## 2013-04-05 NOTE — Telephone Encounter (Signed)
I have left a message that the MRI has not been reviewed by Dr. Carlean Purl and that I will call as soon as he has sent me the results

## 2013-04-06 LAB — AFP TUMOR MARKER: AFP-Tumor Marker: 44.8 ng/mL — ABNORMAL HIGH (ref 0.0–8.0)

## 2013-04-10 ENCOUNTER — Other Ambulatory Visit: Payer: Self-pay

## 2013-04-10 DIAGNOSIS — R772 Abnormality of alphafetoprotein: Secondary | ICD-10-CM

## 2013-04-10 DIAGNOSIS — K7460 Unspecified cirrhosis of liver: Secondary | ICD-10-CM

## 2013-04-10 NOTE — Progress Notes (Signed)
Quick Note:  AFP is higher but not diagnostic I have spoken to [name] and recommended we schedule him for [place] (or CT-guided) biopsy of liver lesion(s) suspicious for cancer I explained that coag status and PLT levels could have some impact on decision   Please arrange ______

## 2013-04-11 ENCOUNTER — Other Ambulatory Visit: Payer: Self-pay | Admitting: Radiology

## 2013-04-12 ENCOUNTER — Encounter (HOSPITAL_COMMUNITY): Payer: Self-pay | Admitting: Pharmacy Technician

## 2013-04-15 ENCOUNTER — Telehealth: Payer: Self-pay | Admitting: Internal Medicine

## 2013-04-15 MED ORDER — HYDROXYZINE HCL 25 MG PO TABS: ORAL_TABLET | ORAL | Status: DC

## 2013-04-15 NOTE — Telephone Encounter (Signed)
Patient is very agitated since conversation with Dr. Carlean Purl last week.  Very anxious, not able to eat, not sleeping.  Having trouble controlling his BS.  Wife is asking for something for his nerves?

## 2013-04-15 NOTE — Telephone Encounter (Signed)
Patient's wife advised

## 2013-04-15 NOTE — Telephone Encounter (Signed)
Try hydroxyzine 25 mg 1-2 every 6 hrs prn and qhs #30 no refill  Watch out for sleepiness

## 2013-04-16 ENCOUNTER — Telehealth: Payer: Self-pay | Admitting: Internal Medicine

## 2013-04-16 MED ORDER — HYDROXYZINE HCL 25 MG PO TABS: ORAL_TABLET | ORAL | Status: DC

## 2013-04-16 NOTE — Telephone Encounter (Signed)
I have apologized that rx was sent to the mail order pharmacy [facility].  I have called and cancelled the prescription with [facility] and spoke with Merrily Pew, pharmacist.  Rx sent to his local [facility].

## 2013-04-17 ENCOUNTER — Ambulatory Visit (HOSPITAL_COMMUNITY)
Admission: RE | Admit: 2013-04-17 | Discharge: 2013-04-17 | Disposition: A | Discharge status: Home / Self Care | Payer: Medicare Other | Source: Ambulatory Visit | Attending: Internal Medicine | Admitting: Internal Medicine

## 2013-04-17 DIAGNOSIS — K7460 Unspecified cirrhosis of liver: Secondary | ICD-10-CM

## 2013-04-17 DIAGNOSIS — D696 Thrombocytopenia, unspecified: Secondary | ICD-10-CM | POA: Insufficient documentation

## 2013-04-17 DIAGNOSIS — R772 Abnormality of alphafetoprotein: Secondary | ICD-10-CM

## 2013-04-17 LAB — PROTIME-INR
INR: 1.36 (ref 0.00–1.49)
Prothrombin Time: 16.4 seconds — ABNORMAL HIGH (ref 11.6–15.2)

## 2013-04-17 LAB — CBC
HCT: 34.5 % — ABNORMAL LOW (ref 39.0–52.0)
Hemoglobin: 11.6 g/dL — ABNORMAL LOW (ref 13.0–17.0)
MCH: 32.8 pg (ref 26.0–34.0)
MCHC: 33.6 g/dL (ref 30.0–36.0)
MCV: 97.5 fL (ref 78.0–100.0)
PLATELETS: 48 10*3/uL — AB (ref 150–400)
RBC: 3.54 MIL/uL — ABNORMAL LOW (ref 4.22–5.81)
RDW: 15 % (ref 11.5–15.5)
WBC: 4.3 10*3/uL (ref 4.0–10.5)

## 2013-04-17 LAB — GLUCOSE, CAPILLARY: Glucose-Capillary: 140 mg/dL — ABNORMAL HIGH (ref 70–99)

## 2013-04-17 LAB — APTT: APTT: 30 s (ref 24–37)

## 2013-04-17 MED ORDER — SODIUM CHLORIDE 0.9 % IV SOLN
Freq: Once | INTRAVENOUS | Status: AC
  Administered 2013-04-17: 09:00:00 via INTRAVENOUS

## 2013-04-17 NOTE — Progress Notes (Signed)
Brief Interventional Radiology Progress Note  Pt in today for US guided liver biopsy to confirm [place].  He is thrombocytopenic with plts of 48k which is just below our normal threshold of 50k for a solid organ biopsy.    After reviewing his recent prior MRI he has a total of 4 (> 1cm)  lesions that all demonstrate arterial enhancement and delayed washout which is diagnostic of multifocal HCC by both AASLD and UNOS/OPTN criteria. Given these imaging findings and the thrombocytopenia, the biopsy ws cancelled.      I discussed the findings and imaging diagnosis with Dr, Carlean Purl and he is in agreement.  By [facility] staging, Mr. Canoy has Intermediate (Stage B) [place] and may be a candidate for liver directed transarterial chemo or radioembolization depending on his ECOG status. He has sufficient hepatic reserve with a MELD of 13 and Br of 1.5 as calculated on labs from 1 month ago.  Dr. Carlean Purl will refer him to oncology and interventional radiology (Dr. Vernard Gambles).  We should also consider presenting him at GI Tumor Board.      Signed,  Criselda Peaches, MD Vascular & Interventional Radiologist [facility] Radiology

## 2013-04-17 NOTE — Progress Notes (Signed)
Pt presented for liver lesion biopsy. Labs indicate continued thrombocytopenia with PLT count 48k Procedure cancelled.  Dr. Laurence Ferrari has also reviewed imaging and d/w Dr. Carlean Purl. See full note/recs from [facility].  Ascencion Dike PA-C Interventional Radiology 04/17/2013 9:53 AM'

## 2013-04-19 ENCOUNTER — Other Ambulatory Visit: Payer: Self-pay

## 2013-04-19 ENCOUNTER — Telehealth: Payer: Self-pay | Admitting: Internal Medicine

## 2013-04-19 DIAGNOSIS — C220 Liver cell carcinoma: Secondary | ICD-10-CM

## 2013-04-19 NOTE — Addendum Note (Signed)
Addended by: Marlon Pel on: 04/19/2013 03:57 PM   Modules accepted: Orders

## 2013-04-19 NOTE — Telephone Encounter (Signed)
Patient referred to interventional radiology not radiation oncology.  I have left a voicemail for Tammy at [place] to call back to discuss

## 2013-04-19 NOTE — Telephone Encounter (Signed)
Patients's wife aware that referral to oncology and radiation oncology has been initiated and that they should expect a call.

## 2013-04-22 ENCOUNTER — Telehealth: Payer: Self-pay | Admitting: Internal Medicine

## 2013-04-22 ENCOUNTER — Other Ambulatory Visit: Payer: Self-pay | Admitting: *Deleted

## 2013-04-22 MED ORDER — METOPROLOL TARTRATE 25 MG PO TABS: 12.5000 mg | ORAL_TABLET | Freq: Two times a day (BID) | ORAL | Status: DC

## 2013-04-22 MED ORDER — GABAPENTIN 300 MG PO CAPS: 300.0000 mg | ORAL_CAPSULE | Freq: Three times a day (TID) | ORAL | Status: DC

## 2013-04-22 NOTE — Telephone Encounter (Signed)
OK X1 

## 2013-04-22 NOTE — Telephone Encounter (Signed)
Requesting Gabapentin 300mg  Take 1 capsule by mouth 3 times daily. Last refill:not sure Last OV:(acute w/ Dr. Paz):02-26-13-has appt on 05-10-13 for CPE Please advise.//AB/CMA    Rx for the Metorpolol sent to the pharmacy by e-script.//AB/CMA

## 2013-04-22 NOTE — Telephone Encounter (Signed)
Patient is scheduled for Oncology and IR.  All appointments were scheduled with his wife and they are aware

## 2013-04-22 NOTE — Telephone Encounter (Signed)
I spoke with [facility] and new orders placed per their direction.  They will contact the patient for an appt.

## 2013-04-22 NOTE — Telephone Encounter (Signed)
Rx sent to the pharmacy by e-script.//AB/CMA 

## 2013-04-22 NOTE — Telephone Encounter (Signed)
I called to review plans for oncology evaluation, IR evaluation with Mrs. Pribyl.

## 2013-04-23 ENCOUNTER — Telehealth: Payer: Self-pay | Admitting: Internal Medicine

## 2013-04-23 ENCOUNTER — Telehealth: Payer: Self-pay | Admitting: *Deleted

## 2013-04-23 ENCOUNTER — Ambulatory Visit (HOSPITAL_BASED_OUTPATIENT_CLINIC_OR_DEPARTMENT_OTHER): Payer: Medicare Other | Admitting: Internal Medicine

## 2013-04-23 ENCOUNTER — Other Ambulatory Visit: Payer: Medicare Other

## 2013-04-23 ENCOUNTER — Ambulatory Visit: Payer: Medicare Other

## 2013-04-23 VITALS — BP 159/66 | HR 62 | Temp 97.7°F | Resp 17 | Ht 75.0 in | Wt 242.4 lb

## 2013-04-23 DIAGNOSIS — R109 Unspecified abdominal pain: Secondary | ICD-10-CM

## 2013-04-23 DIAGNOSIS — C228 Malignant neoplasm of liver, primary, unspecified as to type: Secondary | ICD-10-CM

## 2013-04-23 DIAGNOSIS — C220 Liver cell carcinoma: Secondary | ICD-10-CM

## 2013-04-23 NOTE — Patient Instructions (Signed)
Sorafenib Oral Tablet  What is this medicine?  SORAFENIB (soe RAF e nib) is a chemotherapy drug. It targets specific proteins within cancer cells and stops the cancer cell from growing. This medicine is used to treat liver cancer and kidney cancer.  This medicine may be used for other purposes; ask your health care provider or pharmacist if you have questions.  COMMON BRAND NAME(S): Nexavar  What should I tell my health care provider before I take this medicine?  They need to know if you have any of these conditions:  -bleeding problems  -heart disease  -high blood pressure  -kidney disease  -liver disease  -lung cancer  -recent surgery  -an unusual or allergic reaction to sorafenib, other medicines, foods, dyes, or preservatives  -pregnant or trying to get pregnant  -breast-feeding  How should I use this medicine?  Take this medicine by mouth with a glass of water. Follow the directions on the prescription label. Do not cut, crush or chew this medicine. Take this medicine on an empty stomach, at least 1 hour before or 2 hours after meals. Do not take with food. Take your medicine at regular intervals. Do not take it more often than directed. Do not stop taking except on your doctor's advice.  Talk to your pediatrician regarding the use of this medicine in children. Special care may be needed.  Overdosage: If you think you have taken too much of this medicine contact a poison control center or emergency room at once.  NOTE: This medicine is only for you. Do not share this medicine with others.  What if I miss a dose?  If you miss a dose, take it as soon as you can. If it is almost time for your next dose, take only that dose. Do not take double or extra doses.  What may interact with this medicine?  This medicine may interact with the following medications:  -carbamazepine  -dexamethasone  -medicines for seizures like carbamazepine, phenobarbital, phenytoin  -neomycin  -rifabutin  -rifampin  -St. John's  Wort  -warfarin  This list may not describe all possible interactions. Give your health care provider a list of all the medicines, herbs, non-prescription drugs, or dietary supplements you use. Also tell them if you smoke, drink alcohol, or use illegal drugs. Some items may interact with your medicine.  What should I watch for while using this medicine?  This drug may make you feel generally unwell. This is not uncommon, as chemotherapy can affect healthy cells as well as cancer cells. Report any side effects. Continue your course of treatment even though you feel ill unless your doctor tells you to stop.  Men and women should use effective birth control while taking this medicine and for 2 weeks after stopping this medicine. Do not become pregnant while taking this medicine. Women should inform their doctor if they wish to become pregnant or think they might be pregnant. There is a potential for serious side effects to an unborn child. Talk to your health care professional or pharmacist for more information. Do not breast-feed an infant while taking this medicine.  If you are going to have surgery or any other procedures, tell your doctor you are taking this medicine.  What side effects may I notice from receiving this medicine?  Side effects that you should report to your doctor or health care professional as soon as possible:  -allergic reactions like skin rash, itching or hives, swelling of the face, lips, or tongue  -  black, tarry stools  -breathing problems  -chest pain or chest tightness  -dark urine  -dizziness  -fast or irregular heartbeat  -feeling faint or lightheaded  -high fever  -light-colored stools  -nausea, vomiting  -redness, blistering, peeling or loosening of the skin, including inside the mouth  -right upper belly pain  -sores on the hands or feet  -spitting up blood or brown material that looks like coffee grounds  -stomach pain  -yellowing of the eyes or skinSide effects that usually do not  require medical attention (report to your doctor or health care professional if they continue or are bothersome):  -diarrhea  -hair loss  -loss of appetite  -tiredness  -weight loss  This list may not describe all possible side effects. Call your doctor for medical advice about side effects. You may report side effects to FDA at 1-800-FDA-1088.  Where should I keep my medicine?  Keep out of the reach of children.  Store at room temperature between 15 and 30 degrees C (59 and 86 degrees F). Protect from moisture. Throw away any unused medicine after the expiration date.  NOTE: This sheet is a summary. It may not cover all possible information. If you have questions about this medicine, talk to your doctor, pharmacist, or health care provider.   2014, [employer]vier/Gold Standard. (2010-12-24 14:11:09)

## 2013-04-23 NOTE — Telephone Encounter (Signed)
gv and printed appt sched anda vs for pt for March.... °

## 2013-04-23 NOTE — Telephone Encounter (Signed)
Spoke with patient's wife and confirmed appointment with Oncologist.  Contact names, directions, and phone numbers were provided.

## 2013-04-24 ENCOUNTER — Ambulatory Visit: Payer: Medicare Other | Admitting: Radiation Oncology

## 2013-04-24 ENCOUNTER — Telehealth: Payer: Self-pay | Admitting: Gastroenterology

## 2013-04-24 ENCOUNTER — Other Ambulatory Visit: Payer: Self-pay | Admitting: *Deleted

## 2013-04-24 ENCOUNTER — Telehealth: Payer: Self-pay | Admitting: Endocrinology

## 2013-04-24 ENCOUNTER — Ambulatory Visit: Payer: Medicare Other

## 2013-04-24 NOTE — Telephone Encounter (Signed)
Since his last visit he has had 3 very low blood sugar readings, in the middle of the night, he gets very disoriented. They were 37, 61, and 79 after 50 u of insulin after dinner time. He is not eating well and his appetite is gone he was diagnosed with cancer.

## 2013-04-24 NOTE — Telephone Encounter (Addendum)
Wife confirms that pt is taking 25-30-50.  Please see below as well and advise. Thanks!

## 2013-04-24 NOTE — Telephone Encounter (Signed)
Glendell Docker, His case was discussed at tumor board.  Consensus was that there is no need for biopsy of the masses, they are [facility] and given numerous lesions (probably more than 3 on mr) that he is not a transplant candidate.

## 2013-04-24 NOTE — Telephone Encounter (Signed)
Rx's denied refill was sent to another pharmacy.//AB/CMA

## 2013-04-25 ENCOUNTER — Ambulatory Visit
Admission: RE | Admit: 2013-04-25 | Discharge: 2013-04-25 | Disposition: A | Discharge status: Home / Self Care | Payer: Medicare Other | Source: Ambulatory Visit | Attending: Internal Medicine | Admitting: Internal Medicine

## 2013-04-25 DIAGNOSIS — C220 Liver cell carcinoma: Secondary | ICD-10-CM

## 2013-04-25 NOTE — Telephone Encounter (Signed)
Pts wife informed.

## 2013-04-25 NOTE — Progress Notes (Signed)
[place] [phone]) [phone]   [phone]  NEW PATIENT EVALUATION   Name: [name] Date: 04/25/2013 MRN: [id number] DOB: 1935/05/11  PCP: Unice Cobble, MD   REFERRING PHYSICIAN: Hendricks Limes, MD  REASON FOR REFERRAL: Hepatocellular carcinoma [facility])    HISTORY OF PRESENT ILLNESS:[name] is a 78 y.o. 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     He had labs collected on 04/05/2013 demonstrating an AFP-tumor marker of 44.8 up from 2.5 nearly one year ago.  His CBC on 04/17/2013 revealed a WBC of 4.3; hemoglobin of 11.6; HCT of 34.5 and platelets of 48.  His INR was 1.36.  He had a MR of the liver with and without contrast on 04/03/2013 which showed cirrhosis with multiple anterior segment right liver lobe lesions, most consistent with multifocal HCC.  There was also portal venous hypertension with chronic non-occulusive wall thrombus in the splenic venin and splenoportal confluence.  His TIPS shunt catheter was patent.  He was referred for US biopsy of liver lesion by Dr. Laurence Ferrari of Interventional radiology and based on  MRI he has a total of 4 (> 1cm) lesions that all demonstrate arterial enhancement and delayed washout which is diagnostic of multifocal HCC by both AASLD and UNOS/OPTN criteria. A biopsy was not necessary. He was also seen by Dr. Loanne Drilling on 04/01/2013 for management of his insulin-requrining diabetes. He was was seen by Dr. Carlean Purl of Gastroenterology on 03/20/2013. Dr. Godfrey Pick had dilated his GE junction stenosis stricure area up to 18 mm in December, 2014 with the patient reporting improvement in his swallowing.     Today, he is accompanied by his wife [name] and daughter [name].  He denies any recent bleeding episodes.  He has pain that is mostly associated with eating larger meals. His abdominal pain has been  increasing over the past 6 months. He also reports that it hurts to swallow.  He reports a history of gastric varices with a significant bleed about 5 years ago requiring hospitalization.  He endorses losing about 30 lbs over the past one years.  He reports cramps that or occasional.    He was [facility] for lower extremity ulcer/cellulitis about one year ago.  He drank heaving for many years but his last alcohol drink was 5 years ago.  He follows [facility] for his extensive cardiac history.  He ambulates with the assistance of a cane.  He reports good bowel movements with lactulose taken as needed.   PAST MEDICAL HISTORY:  has a past medical history of Diverticulosis; Gastric antral vascular ectasia; Rhinophyma; Melanoma; CAD (coronary artery disease); Atrial fibrillation; Aortic stenosis; Visual loss, one eye; Internal hemorrhoids; GERD (gastroesophageal reflux disease); Hypertension; Allergic rhinitis; Anemia; Cirrhosis, alcoholic; Thrombocytopenia; Bleeding esophageal varices; Encephalopathy, unspecified; Gout; Short-segment Barrett's esophagus; Venous insufficiency; Ejection fraction; Carotid bruit; Skin cancer; Cellulitis; CHF (congestive heart failure); Sinus bradycardia; Diastolic CHF; Complication of anesthesia; Heart murmur; Anginal pain; Shortness of breath; OSA on CPAP; Diabetes mellitus, type 2; History of blood transfusion; H/O hiatal hernia; Osteoarthritis; Chronic lower back pain; and Aspiration into airway-silent on modified barium swallow (03/20/2013).     PAST SURGICAL HISTORY: Past Surgical History  Procedure Laterality Date  . Total knee arthroplasty Right   . Esophagogastroduodenoscopy  04/10/2008    esophageal varices, cardia varix  . Tips procedure  2/10  .  Back surgery      X5 "for ruptured disc; last time did a fusion" (06/21/2012)  . Excisional hemorrhoidectomy    . Release scar contracture / graft repairs of hand      bilaterally   . Elbow surgery Right   .  Hiatal hernia repair    . Colonoscopy w/ polypectomy  02/14/2007    adenomatous polyps, diverticulosis, internal hemorrhoids/rectal varices  . Cataract extraction, bilateral  January 2013  . Tonsillectomy    . Appendectomy    . Joint replacement    . Coronary artery bypass graft  1993    6 Bypass  . Cataract extraction w/ intraocular lens  implant, bilateral       CURRENT MEDICATIONS: has a current medication list which includes the following prescription(s): acetaminophen, allopurinol, vitamin c, calcium-magnesium-zinc, fentanyl, iron, furosemide, gabapentin, glucose blood, hydroxyzine, insulin aspart, insulin syringe .5cc/31gx5/16", isosorbide mononitrate, lactulose, loratadine, metoprolol tartrate, nitroglycerin, omeprazole, penicillin v potassium, polyethylene glycol, prednisone, rifaximin, spironolactone, vitamin b-1, and tizanidine.   ALLERGIES: Shellfish-derived products; Amoxicillin-pot clavulanate; Aspirin; Buprenorphine hcl-naloxone hcl; Codeine; Ezetimibe; Hydromorphone hcl; Meperidine hcl; Morphine; Morphine sulfate; Oxycodone; and Sulfonamide derivatives   SOCIAL HISTORY:  reports that he quit smoking about 38 years ago. His smoking use included Cigarettes. He has a 40 pack-year smoking history. He has never used smokeless tobacco. He reports that he does not drink alcohol or use illicit drugs.   FAMILY HISTORY: family history includes Alzheimer's disease in his mother; Cancer in his father; Diabetes in his brother; Heart attack in his other; Heart disease in his brother and sister; Liver disease in his brother and sister; Melanoma in his other; Stroke in his other. There is no history of Colon cancer.   LABORATORY DATA:  CBC    Component Value Date/Time   WBC 4.3 04/17/2013 0841   RBC 3.54* 04/17/2013 0841   HGB 11.6* 04/17/2013 0841   HCT 34.5* 04/17/2013 0841   PLT 48* 04/17/2013 0841   MCV 97.5 04/17/2013 0841   MCH 32.8 04/17/2013 0841   MCHC 33.6 04/17/2013 0841   RDW  15.0 04/17/2013 0841   LYMPHSABS 1.2 01/22/2013 0920   MONOABS 0.4 01/22/2013 0920   EOSABS 0.0 01/22/2013 0920   BASOSABS 0.0 01/22/2013 0920    CMP     Component Value Date/Time   NA 139 03/20/2013 1230   K 3.8 03/20/2013 1230   CL 103 03/20/2013 1230   CO2 30 03/20/2013 1230   GLUCOSE 177* 03/20/2013 1230   BUN 18 03/20/2013 1230   CREATININE 1.2 03/20/2013 1230   CREATININE 1.28 01/14/2013 1553   CALCIUM 9.0 03/20/2013 1230   PROT 6.2 03/20/2013 1230   ALBUMIN 3.4* 03/20/2013 1230   AST 24 03/20/2013 1230   ALT 13 03/20/2013 1230   ALKPHOS 54 03/20/2013 1230   BILITOT 1.5* 03/20/2013 1230   GFRNONAA 57* 06/25/2012 0556   GFRAA 66* 06/25/2012 0556     RADIOGRAPHY: Mr Liver W Wo Contrast  04/03/2013   CLINICAL DATA:  Cirrhosis.  Possible hepatocellular carcinoma on CT.  EXAM: MRI ABDOMEN WITHOUT AND WITH CONTRAST  TECHNIQUE: Multiplanar, multisequence MR imaging was performed both before and after administration of intravenous contrast.  CONTRAST:  9 cc Eovist, a mixed extracellular and hepatobiliary phase agent.  COMPARISON:  CT ABDOMEN W/O CM dated 03/21/2013; [place] ART/VEN FLOW ABD PELV DOPPLER dated 09/04/2012  FINDINGS: Portions of exam are mildly motion degraded. Bilateral gynecomastia. Cardiomegaly with prior median sternotomy. Trace left pleural fluid.  Atrophy or prior  resection of the lateral segment left lobe of the liver. Cirrhosis which is moderate to severe. Status post patent TIPS shunt catheter placement.  Corresponding to the CT abnormality, are 4 anterior segment right liver lobe lesions which are most consistent with hepatocellular carcinomas. The largest is in the dome of the right lobe and measures 4.6 cm on image 9/series 9. Demonstrates vague arterial post-contrast enhancement and delayed post-contrast hypo enhancement. Hypointense on hepatobiliary phase images.  In inferior and anterior lesion measures 1.8 cm on image 12/series 9. A 2.5 cm lesion on image 17/series 9 is along the course  of the middle hepatic vein, straddling the anterior segment right lobe and medial segment left lobe. Lateral to this is a 1.5 cm lesion on image 17/series 9. These lesions all demonstrate similar imaging characteristics as the dominant lesion.  Portal venous hypertension and splenomegaly. Normal stomach. Pancreatic atrophy. Multiple gallstones without acute cholecystitis or biliary ductal dilatation.  Wall thickening within the central splenic vein is most consistent with chronic wall thrombus. Example image 66/series 503. Also image 73/series 6. There is likely mild chronic thrombus extending into the splenoportal confluence on image 69/series 503.  Normal adrenal glands and kidneys, without abdominal adenopathy. There is a circumaortic left renal vein. Large colonic stool burden. No ascites.  IMPRESSION: 1. Mildly motion degraded exam. 2. Cirrhosis with multiple anterior segment right liver lobe lesions, most consistent with multi focal hepatocellular carcinoma. 3. Portal venous hypertension with chronic nonocclusive wall thrombus in the splenic vein and splenoportal confluence. 4. Patent TIPS shunt catheter. 5. Trace left pleural fluid. 6. Gynecomastia.   Electronically Signed   By: Abigail Miyamoto M.D.   On: 04/03/2013 17:16       REVIEW OF SYSTEMS:  Constitutional: Denies fevers, chills or he does endorse weight loss Eyes: Denies blurriness of vision Ears, nose, mouth, throat, and face: Denies mucositis or sore throat Respiratory: Denies cough, dyspnea or wheezes Cardiovascular: Denies palpitation, chest discomfort or lower extremity swelling Gastrointestinal:  Denies nausea, heartburn or change in bowel habits Skin: Denies abnormal skin rashes Lymphatics: Denies new lymphadenopathy or easy bruising Neurological:Denies numbness, tingling or new weaknesses Behavioral/Psych: Mood is stable, no new changes  All other systems were reviewed with the patient and are negative.  PHYSICAL EXAM:  height is  6\' 3"  (1.905 m) and weight is 242 lb 6.4 oz (109.952 kg). His oral temperature is 97.7 F (36.5 C). His blood pressure is 159/66 and his pulse is 62. His respiration is 17 and oxygen saturation is 100%.   ECOG: 2 GENERAL:alert, no distress and comfortable; Ambulates to the bench cautiously, independently SKIN: skin color, texture, turgor are normal, no rashes or significant lesions; + gynecemastia EYES: normal, Conjunctiva are pink and non-injected, sclera clear OROPHARYNX:no exudate, no erythema and lips, buccal mucosa, and tongue normal  NECK: supple, thyroid normal size, non-tender, without nodularity LYMPH:  no palpable lymphadenopathy in the cervical, axillary or inguinal LUNGS: clear to auscultation and percussion with normal breathing effort HEART: regular rate & rhythm and no murmurs and no lower extremity edema ABDOMEN:abdomen soft, non-tender and normal bowel sounds; distended with mild fluid; No additional stigmata of liver disease, i.e., caput medusa Musculoskeletal:no cyanosis of digits and no clubbing  NEURO: alert & oriented x 3 with fluent speech, no focal motor/sensory deficits; No asterix   IMPRESSION: [name] is a 78 y.o. male with a history of    PLAN:  1.  [place], newly diagnosed based on imaging --We reviewed his  imaging and labs consistent with primary [place].  I agree by [facility] staging, Mr. Kolander has Intermediate (Stage B) [place] and may be a candidate for liver directed transarterial chemo or radioembolization depending on his ECOG status. He has sufficient hepatic reserve with a MELD of 13 and Br of 1.5 as calculated on labs from 1 month ago.   --As reported by Dr. Carlean Purl, his case was discussed at the GI conference and the consensus was that there is no need for biopsy of the masses, they are [facility] and given numerous lesions (probably more than 3 on mr) that he is not a transplant candidate.  --Based on liver only disease with multiple tumors less than 5 cm, we would  consider liver directed therapy with transarterial chemoembolization (TACE) therapy  and/or chemotherapy.  He has been referred to Dr. Sharmon Revere office for consideration of TACE.  Image does not suggest complete portal vein thrombosis, he is not encephalopathic.  His bili is 1.5; LDH not collected. His AST is less than 100;  Tumor volume is less than 50% of the total liver volume.  His plt are stable near 50 without evidence of bleeding.  If he is determined not to be a candidate, we will consider systemic therapy with sorafenib. It can also be considered if tumor is left behind s/p TACE per NCCN guidelines (its combination in this setting is currently being investigated).   They were provided a handout on sorafenib.   2. Follow-up. --We will have his follow in 1-2 weeks with discussing his case with the IR team.    All questions were answered. The patient knows to call the clinic with any problems, questions or concerns. We can certainly see the patient much sooner if necessary.  I spent 40 minutes counseling the patient face to face. The total time spent in the appointment was 60 minutes.    Naheem Mosco, MD 04/25/2013 12:15 PM

## 2013-04-25 NOTE — Telephone Encounter (Signed)
Please reduce insulin to 10 units 3 times a day (just before each meal) Call if low blood sugar happens again

## 2013-04-26 ENCOUNTER — Other Ambulatory Visit: Payer: Self-pay | Admitting: Interventional Radiology

## 2013-04-26 DIAGNOSIS — C220 Liver cell carcinoma: Secondary | ICD-10-CM

## 2013-04-29 ENCOUNTER — Inpatient Hospital Stay (HOSPITAL_COMMUNITY)
Admission: EM | Admit: 2013-04-29 | Discharge: 2013-05-04 | DRG: 602 | Disposition: A | Discharge status: Home / Self Care | Payer: Medicare Other | Attending: Internal Medicine | Admitting: Internal Medicine

## 2013-04-29 ENCOUNTER — Other Ambulatory Visit: Payer: Self-pay | Admitting: Interventional Radiology

## 2013-04-29 ENCOUNTER — Emergency Department (HOSPITAL_COMMUNITY): Payer: Medicare Other

## 2013-04-29 ENCOUNTER — Ambulatory Visit: Payer: Medicare Other | Admitting: Internal Medicine

## 2013-04-29 ENCOUNTER — Encounter (HOSPITAL_COMMUNITY): Payer: Self-pay | Admitting: Emergency Medicine

## 2013-04-29 DIAGNOSIS — Z8249 Family history of ischemic heart disease and other diseases of the circulatory system: Secondary | ICD-10-CM

## 2013-04-29 DIAGNOSIS — Z6830 Body mass index (BMI) 30.0-30.9, adult: Secondary | ICD-10-CM

## 2013-04-29 DIAGNOSIS — E785 Hyperlipidemia, unspecified: Secondary | ICD-10-CM | POA: Diagnosis present

## 2013-04-29 DIAGNOSIS — A0472 Enterocolitis due to Clostridium difficile, not specified as recurrent: Secondary | ICD-10-CM | POA: Diagnosis not present

## 2013-04-29 DIAGNOSIS — I10 Essential (primary) hypertension: Secondary | ICD-10-CM | POA: Diagnosis present

## 2013-04-29 DIAGNOSIS — E119 Type 2 diabetes mellitus without complications: Secondary | ICD-10-CM | POA: Diagnosis present

## 2013-04-29 DIAGNOSIS — K219 Gastro-esophageal reflux disease without esophagitis: Secondary | ICD-10-CM | POA: Diagnosis present

## 2013-04-29 DIAGNOSIS — K7682 Hepatic encephalopathy: Secondary | ICD-10-CM

## 2013-04-29 DIAGNOSIS — K2270 Barrett's esophagus without dysplasia: Secondary | ICD-10-CM | POA: Diagnosis present

## 2013-04-29 DIAGNOSIS — L03119 Cellulitis of unspecified part of limb: Principal | ICD-10-CM

## 2013-04-29 DIAGNOSIS — K31819 Angiodysplasia of stomach and duodenum without bleeding: Secondary | ICD-10-CM | POA: Diagnosis present

## 2013-04-29 DIAGNOSIS — I878 Other specified disorders of veins: Secondary | ICD-10-CM | POA: Diagnosis present

## 2013-04-29 DIAGNOSIS — F1020 Alcohol dependence, uncomplicated: Secondary | ICD-10-CM | POA: Diagnosis present

## 2013-04-29 DIAGNOSIS — E782 Mixed hyperlipidemia: Secondary | ICD-10-CM | POA: Diagnosis present

## 2013-04-29 DIAGNOSIS — Z96659 Presence of unspecified artificial knee joint: Secondary | ICD-10-CM

## 2013-04-29 DIAGNOSIS — R1110 Vomiting, unspecified: Secondary | ICD-10-CM

## 2013-04-29 DIAGNOSIS — K7030 Alcoholic cirrhosis of liver without ascites: Secondary | ICD-10-CM

## 2013-04-29 DIAGNOSIS — L02419 Cutaneous abscess of limb, unspecified: Principal | ICD-10-CM | POA: Diagnosis present

## 2013-04-29 DIAGNOSIS — Z87891 Personal history of nicotine dependence: Secondary | ICD-10-CM

## 2013-04-29 DIAGNOSIS — Z66 Do not resuscitate: Secondary | ICD-10-CM | POA: Diagnosis not present

## 2013-04-29 DIAGNOSIS — L97929 Non-pressure chronic ulcer of unspecified part of left lower leg with unspecified severity: Secondary | ICD-10-CM

## 2013-04-29 DIAGNOSIS — Z794 Long term (current) use of insulin: Secondary | ICD-10-CM

## 2013-04-29 DIAGNOSIS — I5030 Unspecified diastolic (congestive) heart failure: Secondary | ICD-10-CM

## 2013-04-29 DIAGNOSIS — D696 Thrombocytopenia, unspecified: Secondary | ICD-10-CM | POA: Diagnosis present

## 2013-04-29 DIAGNOSIS — I4891 Unspecified atrial fibrillation: Secondary | ICD-10-CM

## 2013-04-29 DIAGNOSIS — Z823 Family history of stroke: Secondary | ICD-10-CM

## 2013-04-29 DIAGNOSIS — I350 Nonrheumatic aortic (valve) stenosis: Secondary | ICD-10-CM | POA: Diagnosis present

## 2013-04-29 DIAGNOSIS — I359 Nonrheumatic aortic valve disorder, unspecified: Secondary | ICD-10-CM | POA: Diagnosis present

## 2013-04-29 DIAGNOSIS — I872 Venous insufficiency (chronic) (peripheral): Secondary | ICD-10-CM | POA: Diagnosis present

## 2013-04-29 DIAGNOSIS — I2510 Atherosclerotic heart disease of native coronary artery without angina pectoris: Secondary | ICD-10-CM

## 2013-04-29 DIAGNOSIS — C220 Liver cell carcinoma: Secondary | ICD-10-CM

## 2013-04-29 DIAGNOSIS — E1151 Type 2 diabetes mellitus with diabetic peripheral angiopathy without gangrene: Secondary | ICD-10-CM

## 2013-04-29 DIAGNOSIS — M109 Gout, unspecified: Secondary | ICD-10-CM | POA: Diagnosis present

## 2013-04-29 DIAGNOSIS — Z820 Family history of epilepsy and other diseases of the nervous system: Secondary | ICD-10-CM

## 2013-04-29 DIAGNOSIS — G4733 Obstructive sleep apnea (adult) (pediatric): Secondary | ICD-10-CM | POA: Diagnosis present

## 2013-04-29 DIAGNOSIS — I509 Heart failure, unspecified: Secondary | ICD-10-CM | POA: Diagnosis present

## 2013-04-29 DIAGNOSIS — H5460 Unqualified visual loss, one eye, unspecified: Secondary | ICD-10-CM | POA: Diagnosis present

## 2013-04-29 DIAGNOSIS — Z0289 Encounter for other administrative examinations: Secondary | ICD-10-CM

## 2013-04-29 DIAGNOSIS — L0390 Cellulitis, unspecified: Secondary | ICD-10-CM

## 2013-04-29 DIAGNOSIS — D509 Iron deficiency anemia, unspecified: Secondary | ICD-10-CM | POA: Diagnosis present

## 2013-04-29 DIAGNOSIS — K7290 Hepatic failure, unspecified without coma: Secondary | ICD-10-CM

## 2013-04-29 DIAGNOSIS — Z79899 Other long term (current) drug therapy: Secondary | ICD-10-CM

## 2013-04-29 DIAGNOSIS — L03115 Cellulitis of right lower limb: Secondary | ICD-10-CM

## 2013-04-29 DIAGNOSIS — E440 Moderate protein-calorie malnutrition: Secondary | ICD-10-CM | POA: Insufficient documentation

## 2013-04-29 DIAGNOSIS — C228 Malignant neoplasm of liver, primary, unspecified as to type: Secondary | ICD-10-CM | POA: Diagnosis present

## 2013-04-29 DIAGNOSIS — D6959 Other secondary thrombocytopenia: Secondary | ICD-10-CM | POA: Diagnosis present

## 2013-04-29 DIAGNOSIS — J309 Allergic rhinitis, unspecified: Secondary | ICD-10-CM

## 2013-04-29 DIAGNOSIS — K5730 Diverticulosis of large intestine without perforation or abscess without bleeding: Secondary | ICD-10-CM | POA: Diagnosis present

## 2013-04-29 DIAGNOSIS — Z833 Family history of diabetes mellitus: Secondary | ICD-10-CM

## 2013-04-29 DIAGNOSIS — R609 Edema, unspecified: Secondary | ICD-10-CM

## 2013-04-29 DIAGNOSIS — Z951 Presence of aortocoronary bypass graft: Secondary | ICD-10-CM

## 2013-04-29 LAB — I-STAT CG4 LACTIC ACID, ED: Lactic Acid, Venous: 2.52 mmol/L — ABNORMAL HIGH (ref 0.5–2.2)

## 2013-04-29 LAB — COMPREHENSIVE METABOLIC PANEL
ALT: 12 U/L (ref 0–53)
AST: 22 U/L (ref 0–37)
Albumin: 3.2 g/dL — ABNORMAL LOW (ref 3.5–5.2)
Alkaline Phosphatase: 60 U/L (ref 39–117)
BILIRUBIN TOTAL: 1.9 mg/dL — AB (ref 0.3–1.2)
BUN: 23 mg/dL (ref 6–23)
CALCIUM: 9.2 mg/dL (ref 8.4–10.5)
CHLORIDE: 100 meq/L (ref 96–112)
CO2: 26 meq/L (ref 19–32)
Creatinine, Ser: 1.32 mg/dL (ref 0.50–1.35)
GFR, EST AFRICAN AMERICAN: 59 mL/min — AB (ref >90)
GFR, EST NON AFRICAN AMERICAN: 51 mL/min — AB (ref >90)
Glucose, Bld: 291 mg/dL — ABNORMAL HIGH (ref 70–99)
Potassium: 4 mEq/L (ref 3.7–5.3)
Sodium: 140 mEq/L (ref 137–147)
Total Protein: 6 g/dL (ref 6.0–8.3)

## 2013-04-29 LAB — URINALYSIS, ROUTINE W REFLEX MICROSCOPIC
GLUCOSE, UA: NEGATIVE mg/dL
Hgb urine dipstick: NEGATIVE
KETONES UR: NEGATIVE mg/dL
Leukocytes, UA: NEGATIVE
NITRITE: NEGATIVE
PH: 5.5 (ref 5.0–8.0)
Protein, ur: NEGATIVE mg/dL
SPECIFIC GRAVITY, URINE: 1.021 (ref 1.005–1.030)
Urobilinogen, UA: 1 mg/dL (ref 0.0–1.0)

## 2013-04-29 LAB — CBC WITH DIFFERENTIAL/PLATELET
BASOS PCT: 0 % (ref 0–1)
Basophils Absolute: 0 10*3/uL (ref 0.0–0.1)
EOS ABS: 0 10*3/uL (ref 0.0–0.7)
EOS PCT: 0 % (ref 0–5)
HCT: 34.4 % — ABNORMAL LOW (ref 39.0–52.0)
Hemoglobin: 11.8 g/dL — ABNORMAL LOW (ref 13.0–17.0)
LYMPHS PCT: 9 % — AB (ref 12–46)
Lymphs Abs: 0.6 10*3/uL — ABNORMAL LOW (ref 0.7–4.0)
MCH: 32.8 pg (ref 26.0–34.0)
MCHC: 34.3 g/dL (ref 30.0–36.0)
MCV: 95.6 fL (ref 78.0–100.0)
Monocytes Absolute: 0.5 10*3/uL (ref 0.1–1.0)
Monocytes Relative: 7 % (ref 3–12)
Neutro Abs: 6 10*3/uL (ref 1.7–7.7)
Neutrophils Relative %: 84 % — ABNORMAL HIGH (ref 43–77)
PLATELETS: 41 10*3/uL — AB (ref 150–400)
RBC: 3.6 MIL/uL — AB (ref 4.22–5.81)
RDW: 14.4 % (ref 11.5–15.5)
WBC: 7.1 10*3/uL (ref 4.0–10.5)

## 2013-04-29 LAB — LIPASE, BLOOD: Lipase: 13 U/L (ref 11–59)

## 2013-04-29 MED ORDER — VANCOMYCIN HCL IN DEXTROSE 1-5 GM/200ML-% IV SOLN
1000.0000 mg | Freq: Once | INTRAVENOUS | Status: AC
  Administered 2013-04-29: 1000 mg via INTRAVENOUS
  Filled 2013-04-29: qty 200

## 2013-04-29 MED ORDER — HYDROXYZINE HCL 25 MG PO TABS
25.0000 mg | ORAL_TABLET | Freq: Three times a day (TID) | ORAL | Status: DC | PRN
  Filled 2013-04-29: qty 1

## 2013-04-29 MED ORDER — VITAMIN C 500 MG PO TABS
500.0000 mg | ORAL_TABLET | Freq: Every day | ORAL | Status: DC
  Administered 2013-04-30 – 2013-05-04 (×6): 500 mg via ORAL
  Filled 2013-04-29 (×7): qty 1

## 2013-04-29 MED ORDER — METOPROLOL TARTRATE 12.5 MG HALF TABLET
12.5000 mg | ORAL_TABLET | Freq: Two times a day (BID) | ORAL | Status: DC
  Administered 2013-04-30 – 2013-05-04 (×10): 12.5 mg via ORAL
  Filled 2013-04-29 (×11): qty 1

## 2013-04-29 MED ORDER — DEXTROSE 5 % IV SOLN
1.0000 g | Freq: Once | INTRAVENOUS | Status: AC
  Administered 2013-04-29: 1 g via INTRAVENOUS
  Filled 2013-04-29: qty 10

## 2013-04-29 MED ORDER — HYDROXYZINE HCL 25 MG PO TABS
25.0000 mg | ORAL_TABLET | Freq: Four times a day (QID) | ORAL | Status: DC | PRN
  Filled 2013-04-29: qty 2

## 2013-04-29 MED ORDER — SODIUM CHLORIDE 0.9 % IV SOLN
INTRAVENOUS | Status: DC
  Administered 2013-04-29: 19:00:00 via INTRAVENOUS

## 2013-04-29 MED ORDER — VANCOMYCIN HCL 10 G IV SOLR
1500.0000 mg | INTRAVENOUS | Status: DC
  Administered 2013-04-30 – 2013-05-02 (×3): 1500 mg via INTRAVENOUS
  Filled 2013-04-29 (×3): qty 1500

## 2013-04-29 MED ORDER — ONDANSETRON HCL 4 MG PO TABS: 4.0000 mg | ORAL_TABLET | Freq: Four times a day (QID) | ORAL | Status: DC | PRN

## 2013-04-29 MED ORDER — ALLOPURINOL 150 MG HALF TABLET
150.0000 mg | ORAL_TABLET | Freq: Every day | ORAL | Status: DC
  Administered 2013-04-30 – 2013-05-04 (×6): 150 mg via ORAL
  Filled 2013-04-29 (×7): qty 1

## 2013-04-29 MED ORDER — INSULIN ASPART 100 UNIT/ML ~~LOC~~ SOLN
10.0000 [IU] | Freq: Every day | SUBCUTANEOUS | Status: DC
  Administered 2013-04-30: 10 [IU] via SUBCUTANEOUS

## 2013-04-29 MED ORDER — INSULIN ASPART 100 UNIT/ML ~~LOC~~ SOLN
20.0000 [IU] | Freq: Every day | SUBCUTANEOUS | Status: DC
  Administered 2013-04-30: 20 [IU] via SUBCUTANEOUS

## 2013-04-29 MED ORDER — OMEPRAZOLE 20 MG PO CPDR
20.0000 mg | DELAYED_RELEASE_CAPSULE | Freq: Every day | ORAL | Status: DC
  Administered 2013-04-30 – 2013-05-04 (×6): 20 mg via ORAL
  Filled 2013-04-29 (×7): qty 1

## 2013-04-29 MED ORDER — ISOSORBIDE MONONITRATE ER 60 MG PO TB24
60.0000 mg | ORAL_TABLET | Freq: Every day | ORAL | Status: DC
  Administered 2013-04-30 – 2013-05-04 (×6): 60 mg via ORAL
  Filled 2013-04-29 (×7): qty 1

## 2013-04-29 MED ORDER — ENOXAPARIN SODIUM 40 MG/0.4ML ~~LOC~~ SOLN
40.0000 mg | SUBCUTANEOUS | Status: DC
  Administered 2013-04-30: 40 mg via SUBCUTANEOUS
  Filled 2013-04-29 (×3): qty 0.4

## 2013-04-29 MED ORDER — ONDANSETRON HCL 4 MG/2ML IJ SOLN: 4.0000 mg | Freq: Four times a day (QID) | INTRAMUSCULAR | Status: DC | PRN

## 2013-04-29 MED ORDER — LORATADINE 10 MG PO TABS
10.0000 mg | ORAL_TABLET | Freq: Every day | ORAL | Status: DC
  Administered 2013-04-30 – 2013-05-04 (×6): 10 mg via ORAL
  Filled 2013-04-29 (×6): qty 1

## 2013-04-29 MED ORDER — INSULIN ASPART 100 UNIT/ML ~~LOC~~ SOLN
0.0000 [IU] | Freq: Three times a day (TID) | SUBCUTANEOUS | Status: DC
  Administered 2013-04-30: 5 [IU] via SUBCUTANEOUS
  Administered 2013-04-30: 3 [IU] via SUBCUTANEOUS
  Administered 2013-04-30: 2 [IU] via SUBCUTANEOUS

## 2013-04-29 MED ORDER — GABAPENTIN 300 MG PO CAPS
300.0000 mg | ORAL_CAPSULE | Freq: Three times a day (TID) | ORAL | Status: DC
  Administered 2013-04-30 – 2013-05-04 (×14): 300 mg via ORAL
  Filled 2013-04-29 (×17): qty 1

## 2013-04-29 MED ORDER — FENTANYL 100 MCG/HR TD PT72
100.0000 ug | MEDICATED_PATCH | TRANSDERMAL | Status: DC
  Administered 2013-04-30 – 2013-05-02 (×2): 100 ug via TRANSDERMAL
  Filled 2013-04-29 (×2): qty 1

## 2013-04-29 MED ORDER — PIPERACILLIN-TAZOBACTAM 3.375 G IVPB
3.3750 g | Freq: Three times a day (TID) | INTRAVENOUS | Status: DC
  Administered 2013-04-29 – 2013-05-03 (×11): 3.375 g via INTRAVENOUS
  Filled 2013-04-29 (×15): qty 50

## 2013-04-29 MED ORDER — NITROGLYCERIN 0.4 MG SL SUBL: 0.4000 mg | SUBLINGUAL_TABLET | SUBLINGUAL | Status: DC | PRN

## 2013-04-29 MED ORDER — INSULIN ASPART 100 UNIT/ML ~~LOC~~ SOLN
40.0000 [IU] | Freq: Every day | SUBCUTANEOUS | Status: DC
  Administered 2013-04-30: 40 [IU] via SUBCUTANEOUS

## 2013-04-29 MED ORDER — PREDNISONE 5 MG PO TABS
5.0000 mg | ORAL_TABLET | Freq: Every day | ORAL | Status: DC
  Administered 2013-04-30 – 2013-05-04 (×5): 5 mg via ORAL
  Filled 2013-04-29 (×8): qty 1

## 2013-04-29 MED ORDER — PENICILLIN V POTASSIUM 250 MG PO TABS
250.0000 mg | ORAL_TABLET | ORAL | Status: DC
  Filled 2013-04-29: qty 1

## 2013-04-29 MED ORDER — SPIRONOLACTONE 50 MG PO TABS
50.0000 mg | ORAL_TABLET | Freq: Every day | ORAL | Status: DC
  Administered 2013-04-30 – 2013-05-04 (×6): 50 mg via ORAL
  Filled 2013-04-29 (×7): qty 1

## 2013-04-29 MED ORDER — ONDANSETRON HCL 4 MG/2ML IJ SOLN: 4.0000 mg | Freq: Once | INTRAMUSCULAR | Status: DC

## 2013-04-29 MED ORDER — ACETAMINOPHEN 500 MG PO TABS: 1000.0000 mg | ORAL_TABLET | Freq: Four times a day (QID) | ORAL | Status: DC | PRN

## 2013-04-29 MED ORDER — FUROSEMIDE 40 MG PO TABS
40.0000 mg | ORAL_TABLET | Freq: Two times a day (BID) | ORAL | Status: DC
  Administered 2013-04-30 – 2013-05-01 (×4): 40 mg via ORAL
  Filled 2013-04-29 (×7): qty 1

## 2013-04-29 MED ORDER — INSULIN ASPART 100 UNIT/ML ~~LOC~~ SOLN
0.0000 [IU] | Freq: Every day | SUBCUTANEOUS | Status: DC
Start: 2013-04-29 — End: 2013-04-30
  Administered 2013-04-30: 5 [IU] via SUBCUTANEOUS

## 2013-04-29 MED ORDER — RIFAXIMIN 550 MG PO TABS
550.0000 mg | ORAL_TABLET | Freq: Two times a day (BID) | ORAL | Status: DC
  Administered 2013-04-30 – 2013-05-03 (×8): 550 mg via ORAL
  Filled 2013-04-29 (×10): qty 1

## 2013-04-29 MED ORDER — INSULIN ASPART 100 UNIT/ML ~~LOC~~ SOLN: 10.0000 [IU] | Freq: Three times a day (TID) | SUBCUTANEOUS | Status: DC

## 2013-04-29 MED ORDER — LACTULOSE 10 GM/15ML PO SOLN
20.0000 g | Freq: Two times a day (BID) | ORAL | Status: DC
  Administered 2013-04-30 – 2013-05-03 (×8): 20 g via ORAL
  Filled 2013-04-29 (×10): qty 30

## 2013-04-29 MED ORDER — OMEPRAZOLE MAGNESIUM 20 MG PO TBEC: 20.0000 mg | DELAYED_RELEASE_TABLET | Freq: Every day | ORAL | Status: DC

## 2013-04-29 MED ORDER — PENICILLIN V POTASSIUM 500 MG PO TABS
250.0000 mg | ORAL_TABLET | Freq: Every day | ORAL | Status: DC
  Administered 2013-04-29: 250 mg via ORAL

## 2013-04-29 NOTE — Progress Notes (Signed)
   CARE MANAGEMENT ED NOTE 04/29/2013  Patient:  [name], [name]   Account Number:  [id number]  Date Initiated:  04/29/2013  Documentation initiated by:  Livia Snellen  Subjective/Objective Assessment:   Patient presents to ED with fever nausea and vomiting.     Subjective/Objective Assessment Detail:     Action/Plan:   Action/Plan Detail:   Anticipated DC Date:       Status Recommendation to Physician:   Result of Recommendation:    Other ED Services  Consult Working [place]  Other    Choice offered to / List presented to:            Status of service:  Completed, signed off  ED Comments:   ED Comments Detail:  EDCM spoke to patient and his wife at bedside.  Patient currently living at home with his wife [name] [phone]. Patient curently has a C-PAP from [facility], cane, walker, lift chair, shower chair and a compression machine for his legs at home.  Patient reports he would like a wheelchair.  Patient also has a three wheel scotter at home that they purchased on their own, not through insurance. Patient's son lives directly behind the patient.  Patient's wife reports the patient is independent, just need help dressing and putting on his stockings.  Patient has had [facility] in the past for home health after knee surgery.  Patient's wife reports patient is a patient at the wound care center for wounds he has had on his legs, but hasn't been there in a while.  EDCM provided patient's wife with a list of home health agencies in [facility].  Explained with home health, patient may receive a visiting RN, PT, OT, aide and social worker if needed. Patient and patient's family thankful for resources.  No further EDCM needs at this time.

## 2013-04-29 NOTE — ED Notes (Addendum)
Cancer patient with abdominal pain for several months, this morning developed fever, nausea, and vomiting.  Newly diagnosed with ca to the liver.  Tylenol given orally 1000mg  in route for fever of 102.  Zofran 4mg . Given IV by EMS.  Nausea resolved.

## 2013-04-29 NOTE — Progress Notes (Signed)
ANTIBIOTIC CONSULT NOTE - INITIAL  Pharmacy Consult for vancomycin and Zosyn Indication: cellulitis  Allergies  Allergen Reactions  . Shellfish-Derived Products     Rash & angioedema  . Amoxicillin-Pot Clavulanate     Unsure what reaction was but pt tolerated penicillin 05/25/2012  . Aspirin     REACTION: unable to tolerate due to cirrhosis and varices  . Buprenorphine Hcl-Naloxone Hcl Other (See Comments)    unresponsive  . Codeine     Rash    . Ezetimibe     REACTION: LEG/JOINT PAIN  . Hydromorphone Hcl     unknown  . Meperidine Hcl     unknown  . Morphine     unknown  . Morphine Sulfate     REACTION: unspecified  . Oxycodone     hallucinations  . Sulfonamide Derivatives     Patient Measurements: Body weight: 110kg on 04/23/13 IBW: 84.5kg Adjusted Body Weight: 94.7kg  Vital Signs: Temp: 98.1 F (36.7 C) (03/09 1736) Temp src: Rectal (03/09 1235) BP: 105/44 mmHg (03/09 1730) Pulse Rate: 53 (03/09 1730)  Labs:  Recent Labs  04/29/13 1305  WBC 7.1  HGB 11.8*  PLT 41*  CREATININE 1.32    Medical History: Past Medical History  Diagnosis Date  . Diverticulosis   . Gastric antral vascular ectasia   . Rhinophyma   . Melanoma     resected by [facility]  . CAD (coronary artery disease)     myoview 2007 no ischemia/ no ASA because of GI disease  . Atrial fibrillation     amiodarone in past, but problems and left off meds.  . Aortic stenosis     echo, September, 2010, mild aortic insufficiency  . Visual loss, one eye     right eye-ophthalmic arterial branch occlusion  . Internal hemorrhoids   . GERD (gastroesophageal reflux disease)   . Hypertension   . Allergic rhinitis   . Anemia     multifactorial  . Cirrhosis, alcoholic   . Thrombocytopenia   . Bleeding esophageal varices   . Encephalopathy, unspecified   . Gout   . Short-segment Barrett's esophagus     suspected  . Venous insufficiency   . Ejection fraction     EF 55%, echo, September,  2010  . Carotid bruit     Dopplers okay in the past  . Skin cancer     Basal cell and squamous cell  . Cellulitis     recurrent to RLE/notes 06/21/2012  . CHF (congestive heart failure)   . Sinus bradycardia     January, 2014  . Diastolic CHF     January, 2014  . Complication of anesthesia     "quit breathing once a long time ago; dropped my BP too; it was after I was given Dilaudid" (06/21/2012)  . Heart murmur   . Anginal pain   . Shortness of breath     "stay that way most of the time now" (06/21/2012)  . OSA on CPAP   . Diabetes mellitus, type 2   . History of blood transfusion     "not related to OR" (06/21/2012)  . H/O hiatal hernia   . Osteoarthritis     "ALL OVER" (06/21/2012)  . Chronic lower back pain   . Aspiration into airway-silent on modified barium swallow 03/20/2013    Medications:  Scheduled:  . allopurinol  150 mg Oral Daily  . enoxaparin (LOVENOX) injection  40 mg Subcutaneous Q24H  . fentaNYL  100  mcg Transdermal Q72H  . furosemide  40 mg Oral BID  . gabapentin  300 mg Oral TID  . [START ON 04/30/2013] insulin aspart  10-40 Units Subcutaneous TID AC  . isosorbide mononitrate  60 mg Oral Daily  . lactulose  20 g Oral BID  . loratadine  10 mg Oral Daily  . metoprolol tartrate  12.5 mg Oral BID  . omeprazole  20 mg Oral Daily  . ondansetron  4 mg Intravenous Once  . penicillin v potassium  250 mg Oral 1 day or 1 dose  . penicillin v potassium  250 mg Oral Daily  . predniSONE  5 mg Oral Daily  . rifaximin  550 mg Oral BID  . spironolactone  50 mg Oral Daily  . vitamin C  500 mg Oral Daily   Infusions:  . sodium chloride 100 mL/hr at 04/29/13 4818   Assessment: 41 yoM admitted 3/9 with fever and recurrent cellulitis. Pt with Hx liver cancer, not yet on treatment and also a Hx of diabetes. Patient is seen by ID for this recurrent cellulitis and is on penicillin V potassium 250mg  PO daily for prophylaxis. Pt also on chronic rifaximin for cirrhosis. Pharmacy has  been consulted to dose vancomycin and Zosyn for cellulitis.   Noted patient received ceftriaxone 1g IV x1 at 1358  Patient received vancomycin 1g IV x1 at 1601  Antiinfectives PTA >> rifaximin >> PTA >> pen VK >> 3/9 3/9 >> ceftriaxone x1 3/9 >> vancomycin >> 3/9 >> Zosyn >>    Tmax: 100.3 WBCs: WNL Renal: SCr 1.32 (baseline ~1.2), CrCl 64 ml/min CG, 49 ml/min normlaized  Microbiology 3/9 blood x2: sent 3/9 urine: sent    Goal of Therapy:  Vancomycin trough level 10-15 mcg/ml Eradication of infection  Plan:  - vancomycin 1g IV x1 to complete a 2g loading dose - vancomycin 1500mg  IV q24h to start 3/10 at 2000 - Zosyn 3.375G IV q8h to be infused over 4 hours - will discontinue the pen VK as patient is now started on treatment antibiotics  - vancomycin trough at steady state if indicated - follow-up clinical course, culture results, renal function - follow-up antibiotic de-escalation and length of therapy  Thank you for the consult.  Johny Drilling, PharmD, BCPS Clinical Pharmacist Pager: [phone] Pharmacy: [phone] 04/29/2013 7:21 PM

## 2013-04-29 NOTE — ED Notes (Signed)
Bed: ZR00 Expected date:  Expected time:  Means of arrival:  Comments: ems- elderly, n/v cancer pt

## 2013-04-29 NOTE — H&P (Signed)
[facility] History and Physical  [name] UUV:[id number] DOB: [birth year] DOA: 04/29/2013  Referring physician: ED physician PCP: Unice Cobble, MD   Chief Complaint: fever and right lower extremity pain and swelling   HPI:  Patient is a 78 y.o. male with multiple and complex medical history including HTN, HLD, DM type II, atrial fibrillation, chronic and recurrent LE cellulitis, OSA, diastolic CHF who presented to [facility] ED with main concern of several days duration of  Intermittent fevers as high as 101 F, associate with progressively worsening LE swelling and erythema, right > left LE, tender and warm to touch, associated with throbbing and constant pain, 5/10 in severity, non radiating, worse with ambulation and with no specific alleviating factors. Pt denies chest pain or shortness of breath, no new specific abdominal or urinary concerns. He explains he has somewhat chronic abdominal pain and nausea and was recently diagnosed with liver cancer.   In ED, pt was initially confused and unable to provide history but upon admission to the floor his mental status cleared up and was able to provide some details. Right LE swellign and erythema consistent with cellulitis and [facility] asked to admit for further evaluation and management.   Assessment and Plan: Active Problems:   Cellulitis - place on broad spectrum ABX Vancomycin and Zosyn as pt is diabetic - keep extremity elevated - analgesia as needed for symptoms control    Diastolic CHF - clinically compensated at this time - close monitoring of volume status - continue Lasix 40 mg BID as per home medical regimen - stop IVF   Diabetes type II - continue Insulin and also place on SSI while inpatient    Acute encephalopathy - possibly from liver cancer - check ammonia level - continue Lactulose, Lasix, Spironolactone, Rifaximin - repeat ammonia level in AM   Thrombocytopenia - chronic - no signs of active bleeding and due to  cellulitis can not place SCD's - if Plt drop, consider holding Lovenox for DVT prophylaxis    HTN - continue to monitor, currently SBP in 100's   Radiological Exams on Admission: Dg Chest Port 1 View    04/29/2013  Cardiomegaly with pulmonary venous congestion, but no frank pulmonary edema.   Code Status: Full Family Communication: Pt and wife at bedside Disposition Plan: Admit for further evaluation    Review of Systems:  Constitutional:  Negative for diaphoresis.  HENT: Negative for hearing loss, ear pain, nosebleeds, congestion, sore throat, neck pain, tinnitus and ear discharge.   Eyes: Negative for blurred vision, double vision, photophobia, pain, discharge and redness.  Respiratory: Negative for shortness of breath, wheezing and stridor.   Cardiovascular: Negative for orthopnea, claudication and leg swelling.  Gastrointestinal: Negative for heartburn, constipation, blood in stool and melena.  Genitourinary: Negative for dysuria, urgency, frequency, hematuria and flank pain.  Musculoskeletal: Negative for myalgias, back pain, joint pain and falls.  Skin: Negative for itching and rash.  Neurological: Negative for tingling, tremors, sensory change, speech change, loss of consciousness and headaches.  Endo/Heme/Allergies: Negative for environmental allergies and polydipsia. Does not bruise/bleed easily.  Psychiatric/Behavioral: Negative for suicidal ideas. The patient is not nervous/anxious.      Past Medical History  Diagnosis Date  . Diverticulosis   . Gastric antral vascular ectasia   . Rhinophyma   . Melanoma     resected by [facility] 2008  . CAD (coronary artery disease)     myoview 2007 no ischemia/ no ASA because of GI disease  . Atrial  fibrillation     amiodarone in past, but problems and left off meds.  . Aortic stenosis     echo, September, 2010, mild aortic insufficiency  . Visual loss, one eye     right eye-ophthalmic arterial branch occlusion  . Internal  hemorrhoids   . GERD (gastroesophageal reflux disease)   . Hypertension   . Allergic rhinitis   . Anemia     multifactorial  . Cirrhosis, alcoholic   . Thrombocytopenia   . Bleeding esophageal varices   . Encephalopathy, unspecified   . Gout   . Short-segment Barrett's esophagus     suspected  . Venous insufficiency   . Ejection fraction     EF 55%, echo, September, 2010  . Carotid bruit     Dopplers okay in the past  . Skin cancer     Basal cell and squamous cell  . Cellulitis     recurrent to [facility]/notes 06/21/2012  . CHF (congestive heart failure)   . Sinus bradycardia     January, 2014  . Diastolic CHF     January, 2014  . Complication of anesthesia     "quit breathing once a long time ago; dropped my BP too; it was after I was given Dilaudid" (06/21/2012)  . Heart murmur   . Anginal pain   . Shortness of breath     "stay that way most of the time now" (06/21/2012)  . OSA on CPAP   . Diabetes mellitus, type 2   . History of blood transfusion     "not related to OR" (06/21/2012)  . H/O hiatal hernia   . Osteoarthritis     "ALL OVER" (06/21/2012)  . Chronic lower back pain   . Aspiration into airway-silent on modified barium swallow 03/20/2013    Past Surgical History  Procedure Laterality Date  . Total knee arthroplasty Right   . Esophagogastroduodenoscopy  04/10/2008    esophageal varices, cardia varix  . Tips procedure  2/10  . Back surgery      X5 "for ruptured disc; last time did a fusion" (06/21/2012)  . Excisional hemorrhoidectomy    . Release scar contracture / graft repairs of hand      bilaterally   . Elbow surgery Right   . Hiatal hernia repair    . Colonoscopy w/ polypectomy  02/14/2007    adenomatous polyps, diverticulosis, internal hemorrhoids/rectal varices  . Cataract extraction, bilateral  January 2013  . Tonsillectomy    . Appendectomy    . Joint replacement    . Coronary artery bypass graft  1993    6 Bypass  . Cataract extraction w/ intraocular  lens  implant, bilateral      Social History:  reports that he quit smoking about 38 years ago. His smoking use included Cigarettes. He has a 40 pack-year smoking history. He has never used smokeless tobacco. He reports that he does not drink alcohol or use illicit drugs.  Allergies  Allergen Reactions  . Shellfish-Derived Products     Rash & angioedema  . Amoxicillin-Pot Clavulanate     Unsure what reaction was but pt tolerated penicillin 05/25/2012  . Aspirin     REACTION: unable to tolerate due to cirrhosis and varices  . Buprenorphine Hcl-Naloxone Hcl Other (See Comments)    unresponsive  . Codeine     Rash    . Ezetimibe     REACTION: LEG/JOINT PAIN  . Hydromorphone Hcl     unknown  . Meperidine  Hcl     unknown  . Morphine     unknown  . Morphine Sulfate     REACTION: unspecified  . Oxycodone     hallucinations  . Sulfonamide Derivatives     Family History  Problem Relation Age of Onset  . Alzheimer's disease Mother   . Cancer Father     GI (stomach?)  . Heart disease Sister     2 sisters  . Liver disease Sister   . Diabetes Brother   . Heart disease Brother     3 brothers  . Liver disease Brother   . Heart attack Other     sibling  . Melanoma Other     sibling  . Stroke Other     sibling  . Colon cancer Neg Hx     Prior to Admission medications   Medication Sig Start Date End Date Taking? Authorizing Provider  allopurinol (ZYLOPRIM) 300 MG tablet Take 150 mg by mouth daily.   Yes Historical Provider, MD  Ascorbic Acid (VITAMIN C) 500 MG tablet Take 500 mg by mouth daily.     Yes Historical Provider, MD  Calcium-Magnesium-Zinc (CALCIUM/MAGNESIUM/ZINC FORMULA) 1000-500-50 MG TABS Take 1 tablet by mouth daily.     Yes Historical Provider, MD  fentaNYL (DURAGESIC - DOSED MCG/HR) 100 MCG/HR Place 100 mcg onto the skin every 3 (three) days.   Yes Historical Provider, MD  Ferrous Gluconate (IRON) 240 (27 FE) MG TABS Take 1 tablet by mouth 2 (two) times daily.     Yes Historical Provider, MD  furosemide (LASIX) 40 MG tablet Take 1 tablet (40 mg total) by mouth 2 (two) times daily. 03/29/13  Yes Gatha Mayer, MD  gabapentin (NEURONTIN) 300 MG capsule Take 1 capsule (300 mg total) by mouth 3 (three) times daily. 04/22/13  Yes Hendricks Limes, MD  glucose blood (FREESTYLE LITE) test strip 1 each by Other route 2 (two) times daily. And lancets: Dx code 250.01 09/21/12  Yes Renato Shin, MD  hydrOXYzine (ATARAX/VISTARIL) 25 MG tablet Take 25-50 mg by mouth every 6 (six) hours as needed for anxiety or itching (and at bedtime, if needed.). 04/16/13  Yes Gatha Mayer, MD  insulin aspart (NOVOLOG) 100 UNIT/ML injection Inject 10-40 Units into the skin 3 (three) times daily before meals. 20 units with breakfast, 10 units with lunch and 40 units with the evening meal. 09/06/12  Yes Renato Shin, MD  Insulin Syringe-Needle U-100 (INSULIN SYRINGE .5CC/31GX5/16") 31G X 5/16" 0.5 ML MISC Use one syringe 4 times daily. Dx code 250.01 01/22/13  Yes Renato Shin, MD  isosorbide mononitrate (IMDUR) 60 MG 24 hr tablet Take 60 mg by mouth daily.   Yes Historical Provider, MD  lactulose (CHRONULAC) 10 GM/15ML solution Take 30 mLs (20 g total) by mouth 2 (two) times daily. 03/29/13  Yes Gatha Mayer, MD  loratadine (CLARITIN) 10 MG tablet Take 10 mg by mouth daily.   Yes Historical Provider, MD  metoprolol tartrate (LOPRESSOR) 25 MG tablet Take 0.5 tablets (12.5 mg total) by mouth 2 (two) times daily. 04/22/13  Yes Hendricks Limes, MD  nitroGLYCERIN (NITROSTAT) 0.4 MG SL tablet Place 1 tablet (0.4 mg total) under the tongue every 5 (five) minutes as needed for chest pain. 05/27/12  Yes Adeline Saralyn Pilar, MD  omeprazole (PRILOSEC OTC) 20 MG tablet Take 20 mg by mouth daily.   Yes Historical Provider, MD  penicillin v potassium (VEETID) 250 MG tablet Take 250 mg by mouth daily.  Once daily 03/29/13  Yes Campbell Riches, MD  polyethylene glycol [facility] / GLYCOLAX) packet Take 17 g by mouth  daily as needed. Take two or three times a week   Yes Historical Provider, MD  predniSONE (DELTASONE) 5 MG tablet Take 5 mg by mouth daily.    Yes Historical Provider, MD  rifaximin (XIFAXAN) 550 MG TABS tablet Take 550 mg by mouth 2 (two) times daily.   Yes Historical Provider, MD  spironolactone (ALDACTONE) 50 MG tablet Take 1 tablet (50 mg total) by mouth daily. 03/29/13  Yes Gatha Mayer, MD  Thiamine HCl (VITAMIN B-1) 250 MG tablet Take 125 mg by mouth daily.    Yes Historical Provider, MD  acetaminophen (OFIRMEV) 10 MG/ML SOLN Inject 1,000 mg into the vein once.    Historical Provider, MD  acetaminophen (TYLENOL) 500 MG tablet Take 1,000 mg by mouth every 6 (six) hours as needed for mild pain.     Historical Provider, MD  ondansetron (ZOFRAN) 4 MG/2ML SOLN injection Inject 4 mg into the vein once.    Historical Provider, MD    Physical Exam: Filed Vitals:   04/29/13 1415 04/29/13 1515 04/29/13 1600 04/29/13 1630  BP: 119/45 119/42 114/44 105/40  Pulse: 68 64 60 59  Temp:      TempSrc:      Resp: 15 19 16 14   SpO2: 99% 98% 99% 97%    Physical Exam  Constitutional: Appears well-developed and well-nourished. No distress.  HENT: Normocephalic. External right and left ear normal. MMM Eyes: Conjunctivae and EOM are normal. PERRLA, no scleral icterus.  Neck: Normal ROM. Neck supple. No JVD. No tracheal deviation. No thyromegaly.  CVS: Irregular rate and rhythm, , SEM 3/6, no gallops, no carotid bruit.  Pulmonary: Effort and breath sounds normal, no stridor, rhonchi, wheezes, rales.  Abdominal: Soft. BS +,  no distension, tenderness, rebound or guarding.  Musculoskeletal: Normal range of motion. Right LE erythema and edema, warm to touch and TTP, worse compared to left LE  Lymphadenopathy: No lymphadenopathy noted, cervical, inguinal. Neuro: Alert. Normal reflexes, muscle tone coordination. No cranial nerve deficit. Skin: Skin is warm and dry. No rash noted. Not diaphoretic. No  erythema. No pallor.  Psychiatric: Normal mood and affect. Behavior, judgment, thought content normal.   Labs on Admission:  Basic Metabolic Panel:  Recent Labs Lab 04/29/13 1305  NA 140  K 4.0  CL 100  CO2 26  GLUCOSE 291*  BUN 23  CREATININE 1.32  CALCIUM 9.2   Liver Function Tests:  Recent Labs Lab 04/29/13 1305  AST 22  ALT 12  ALKPHOS 60  BILITOT 1.9*  PROT 6.0  ALBUMIN 3.2*    Recent Labs Lab 04/29/13 1305  LIPASE 13   CBC:  Recent Labs Lab 04/29/13 1305  WBC 7.1  NEUTROABS 6.0  HGB 11.8*  HCT 34.4*  MCV 95.6  PLT 41*   EKG: Pending   Faye Ramsay, MD  [facility] Pager [phone]  If 7PM-7AM, please contact night-coverage [email or web address] Password [facility] 04/29/2013, 5:24 PM

## 2013-04-29 NOTE — ED Notes (Signed)
Patient placed on Venti-mask at 50%. Sats 98%

## 2013-04-29 NOTE — Progress Notes (Signed)
Utilization Review completed.  Autie Vasudevan RN CM  

## 2013-04-29 NOTE — ED Provider Notes (Signed)
CSN: [id number]     Arrival date & time 04/29/13  1219 History   First MD Initiated Contact with Patient 04/29/13 1306     Chief Complaint  Patient presents with  . Fever     (Consider location/radiation/quality/duration/timing/severity/associated sxs/prior Treatment) Patient is a 78 y.o. male presenting with fever. The history is provided by the patient.  Fever  He is here for evaluation of fever, nausea, and vomiting. He has also apparently had upper abdominal pain for several months. He was apparently recently diagnosed with liver cancer and is due to have some sort of procedure; possibly for placement of a catheter, tomorrow. He is unable to give additional history.  Level V caveat- poor historian, acute illness  Past Medical History  Diagnosis Date  . Diverticulosis   . Gastric antral vascular ectasia   . Rhinophyma   . Melanoma     resected by [facility] 2008  . CAD (coronary artery disease)     myoview 2007 no ischemia/ no ASA because of GI disease  . Atrial fibrillation     amiodarone in past, but problems and left off meds.  . Aortic stenosis     echo, September, 2010, mild aortic insufficiency  . Visual loss, one eye     right eye-ophthalmic arterial branch occlusion  . Internal hemorrhoids   . GERD (gastroesophageal reflux disease)   . Hypertension   . Allergic rhinitis   . Anemia     multifactorial  . Cirrhosis, alcoholic   . Thrombocytopenia   . Bleeding esophageal varices   . Encephalopathy, unspecified   . Gout   . Short-segment Barrett's esophagus     suspected  . Venous insufficiency   . Ejection fraction     EF 55%, echo, September, 2010  . Carotid bruit     Dopplers okay in the past  . Skin cancer     Basal cell and squamous cell  . Cellulitis     recurrent to RLE/notes 06/21/2012  . CHF (congestive heart failure)   . Sinus bradycardia     January, 2014  . Diastolic CHF     January, 2014  . Complication of anesthesia     "quit breathing once a  long time ago; dropped my BP too; it was after I was given Dilaudid" (06/21/2012)  . Heart murmur   . Anginal pain   . Shortness of breath     "stay that way most of the time now" (06/21/2012)  . OSA on CPAP   . Diabetes mellitus, type 2   . History of blood transfusion     "not related to OR" (06/21/2012)  . H/O hiatal hernia   . Osteoarthritis     "ALL OVER" (06/21/2012)  . Chronic lower back pain   . Aspiration into airway-silent on modified barium swallow 03/20/2013   Past Surgical History  Procedure Laterality Date  . Total knee arthroplasty Right   . Esophagogastroduodenoscopy  04/10/2008    esophageal varices, cardia varix  . Tips procedure  2/10  . Back surgery      X5 "for ruptured disc; last time did a fusion" (06/21/2012)  . Excisional hemorrhoidectomy    . Release scar contracture / graft repairs of hand      bilaterally   . Elbow surgery Right   . Hiatal hernia repair    . Colonoscopy w/ polypectomy  02/14/2007    adenomatous polyps, diverticulosis, internal hemorrhoids/rectal varices  . Cataract extraction, bilateral  January 2013  .  Tonsillectomy    . Appendectomy    . Joint replacement    . Coronary artery bypass graft  1993    6 Bypass  . Cataract extraction w/ intraocular lens  implant, bilateral     Family History  Problem Relation Age of Onset  . Alzheimer's disease Mother   . Cancer Father     GI (stomach?)  . Heart disease Sister     2 sisters  . Liver disease Sister   . Diabetes Brother   . Heart disease Brother     3 brothers  . Liver disease Brother   . Heart attack Other     sibling  . Melanoma Other     sibling  . Stroke Other     sibling  . Colon cancer Neg Hx    History  Substance Use Topics  . Smoking status: Former Smoker -- 2.00 packs/day for 20 years    Types: Cigarettes    Quit date: 02/22/1975  . Smokeless tobacco: Never Used  . Alcohol Use: No     Comment: 06/21/2012 "none since Feb 2010"    Review of Systems  Unable to  perform ROS Constitutional: Positive for fever.      Allergies  Shellfish-derived products; Amoxicillin-pot clavulanate; Aspirin; Buprenorphine hcl-naloxone hcl; Codeine; Ezetimibe; Hydromorphone hcl; Meperidine hcl; Morphine; Morphine sulfate; Oxycodone; and Sulfonamide derivatives  Home Medications   Current Outpatient Rx  Name  Route  Sig  Dispense  Refill  . allopurinol (ZYLOPRIM) 300 MG tablet   Oral   Take 150 mg by mouth daily.         . Ascorbic Acid (VITAMIN C) 500 MG tablet   Oral   Take 500 mg by mouth daily.           . Calcium-Magnesium-Zinc (CALCIUM/MAGNESIUM/ZINC FORMULA) 1000-500-50 MG TABS   Oral   Take 1 tablet by mouth daily.           . fentaNYL (DURAGESIC - DOSED MCG/HR) 100 MCG/HR   Transdermal   Place 100 mcg onto the skin every 3 (three) days.         . Ferrous Gluconate (IRON) 240 (27 FE) MG TABS   Oral   Take 1 tablet by mouth 2 (two) times daily.          . furosemide (LASIX) 40 MG tablet   Oral   Take 1 tablet (40 mg total) by mouth 2 (two) times daily.   180 tablet   1   . gabapentin (NEURONTIN) 300 MG capsule   Oral   Take 1 capsule (300 mg total) by mouth 3 (three) times daily.   90 capsule   0   . glucose blood (FREESTYLE LITE) test strip   Other   1 each by Other route 2 (two) times daily. And lancets: Dx code 250.01   100 each   5   . hydrOXYzine (ATARAX/VISTARIL) 25 MG tablet   Oral   Take 25-50 mg by mouth every 6 (six) hours as needed for anxiety or itching (and at bedtime, if needed.).         Marland Kitchen insulin aspart (NOVOLOG) 100 UNIT/ML injection   Subcutaneous   Inject 10-40 Units into the skin 3 (three) times daily before meals. 20 units with breakfast, 10 units with lunch and 40 units with the evening meal.         . Insulin Syringe-Needle U-100 (INSULIN SYRINGE .5CC/31GX5/16") 31G X 5/16" 0.5 ML MISC  Use one syringe 4 times daily. Dx code 250.01   120 each   6   . isosorbide mononitrate (IMDUR) 60  MG 24 hr tablet   Oral   Take 60 mg by mouth daily.         Marland Kitchen lactulose (CHRONULAC) 10 GM/15ML solution   Oral   Take 30 mLs (20 g total) by mouth 2 (two) times daily.   1892 mL   5   . loratadine (CLARITIN) 10 MG tablet   Oral   Take 10 mg by mouth daily.         . metoprolol tartrate (LOPRESSOR) 25 MG tablet   Oral   Take 0.5 tablets (12.5 mg total) by mouth 2 (two) times daily.   30 tablet   1   . nitroGLYCERIN (NITROSTAT) 0.4 MG SL tablet   Sublingual   Place 1 tablet (0.4 mg total) under the tongue every 5 (five) minutes as needed for chest pain.   20 tablet   0   . omeprazole (PRILOSEC OTC) 20 MG tablet   Oral   Take 20 mg by mouth daily.         . penicillin v potassium (VEETID) 250 MG tablet   Oral   Take 250 mg by mouth daily. Once daily         . polyethylene glycol (MIRALAX / GLYCOLAX) packet   Oral   Take 17 g by mouth daily as needed. Take two or three times a week         . predniSONE (DELTASONE) 5 MG tablet   Oral   Take 5 mg by mouth daily.          . rifaximin (XIFAXAN) 550 MG TABS tablet   Oral   Take 550 mg by mouth 2 (two) times daily.         Marland Kitchen spironolactone (ALDACTONE) 50 MG tablet   Oral   Take 1 tablet (50 mg total) by mouth daily.   90 tablet   1   . Thiamine HCl (VITAMIN B-1) 250 MG tablet   Oral   Take 125 mg by mouth daily.          Marland Kitchen acetaminophen (OFIRMEV) 10 MG/ML SOLN   Intravenous   Inject 1,000 mg into the vein once.         Marland Kitchen acetaminophen (TYLENOL) 500 MG tablet   Oral   Take 1,000 mg by mouth every 6 (six) hours as needed for mild pain.          Marland Kitchen ondansetron (ZOFRAN) 4 MG/2ML SOLN injection   Intravenous   Inject 4 mg into the vein once.          BP 119/42  Pulse 64  Temp(Src) 100.3 F (37.9 C) (Rectal)  Resp 19  SpO2 98% Physical Exam  Nursing note and vitals reviewed. Constitutional: He is oriented to person, place, and time. He appears well-developed and well-nourished.  HENT:   Head: Normocephalic and atraumatic.  Right Ear: External ear normal.  Left Ear: External ear normal.  Eyes: Conjunctivae and EOM are normal. Pupils are equal, round, and reactive to light.  Neck: Normal range of motion and phonation normal. Neck supple.  Cardiovascular: Normal rate, regular rhythm, normal heart sounds and intact distal pulses.   Pulmonary/Chest: Effort normal. He exhibits no bony tenderness.  Shallow respirations.  Abdominal: Soft. Normal appearance. There is no tenderness.  Musculoskeletal: Normal range of motion.  Asymmetric lower leg swelling, right greater  than left. Right leg has diffuse erythema of the calf.   Neurological: He is alert and oriented to person, place, and time. No cranial nerve deficit or sensory deficit. He exhibits normal muscle tone. Coordination normal.  Skin: Skin is warm, dry and intact.  Psychiatric: He has a normal mood and affect. His behavior is normal.    ED Course  Procedures (including critical care time) Radiologic imaging report reviewed and images by radiography- no infiltrate  - viewed, by me. 13:50- Rocephin ordered for possible UTI   Medications  vancomycin (VANCOCIN) IVPB 1000 mg/200 mL premix (not administered)  cefTRIAXone (ROCEPHIN) 1 g in dextrose 5 % 50 mL IVPB (0 g Intravenous Stopped 04/29/13 1524)    Patient Vitals for the past 24 hrs:  BP Temp Temp src Pulse Resp SpO2  04/29/13 1515 119/42 mmHg - - 64 19 98 %  04/29/13 1415 119/45 mmHg - - 68 15 99 %  04/29/13 1400 128/45 mmHg - - 69 16 100 %  04/29/13 1349 121/43 mmHg - - 66 15 100 %  04/29/13 1235 - 100.3 F (37.9 C) Rectal - - -  04/29/13 1231 127/91 mmHg 98.3 F (36.8 C) Oral 72 22 94 %    1:50 PM Reevaluation with update and discussion. After initial assessment and treatment, an updated evaluation reveals no change in status. Wife now here and states pt's right leg is much redder and more swollen than usual. She also states he vomited this AM, while wearing  his CPAP, but did not choke. His temp. Was, elevated to 102 this AM. Shilo Philipson L   Labs Review Labs Reviewed  COMPREHENSIVE METABOLIC PANEL - Abnormal; Notable for the following:    Glucose, Bld 291 (*)    Albumin 3.2 (*)    Total Bilirubin 1.9 (*)    GFR calc non Af Amer 51 (*)    GFR calc Af Amer 59 (*)    All other components within normal limits  CBC WITH DIFFERENTIAL - Abnormal; Notable for the following:    RBC 3.60 (*)    Hemoglobin 11.8 (*)    HCT 34.4 (*)    Platelets 41 (*)    Neutrophils Relative % 84 (*)    Lymphocytes Relative 9 (*)    Lymphs Abs 0.6 (*)    All other components within normal limits  URINALYSIS, ROUTINE W REFLEX MICROSCOPIC - Abnormal; Notable for the following:    Color, Urine AMBER (*)    Bilirubin Urine SMALL (*)    All other components within normal limits  I-STAT CG4 LACTIC ACID, ED - Abnormal; Notable for the following:    Lactic Acid, Venous 2.52 (*)    All other components within normal limits  URINE CULTURE  CULTURE, BLOOD (ROUTINE X 2)  CULTURE, BLOOD (ROUTINE X 2)  LIPASE, BLOOD   Imaging Review Dg Chest Port 1 View  (if Code Sepsis Called)  04/29/2013   CLINICAL DATA:  Fever, nausea and vomiting.  EXAM: PORTABLE CHEST - 1 VIEW  COMPARISON:  Chest x-ray 06/21/2012.  FINDINGS: Elevation of the left hemidiaphragm is unchanged. Lung volumes are normal. No consolidative airspace disease. No pleural effusions. Mild cephalization of the pulmonary vasculature, without frank pulmonary edema. Mild cardiomegaly. The patient is rotated to the right On today's exam, resulting in distortion of the mediastinal contours and reduced diagnostic sensitivity and specificity for mediastinal pathology. Atherosclerosis in the thoracic aorta. Status post median sternotomy for CABG.  IMPRESSION: 1. Cardiomegaly with pulmonary venous congestion,  but no frank pulmonary edema. 2. Atherosclerosis.   Electronically Signed   By: Vinnie Langton M.D.   On: 04/29/2013  14:03     EKG Interpretation None      MDM   Final diagnoses:  Cellulitis  Vomiting    Recurrent cellulitis, and chronically ill patient with hepatic encephalopathy. No evidence for pneumonia, urinary tract infection, spontaneous bacterial peritonitis, metabolic instability, or acute on chronic hepatic encephalopathy.  Nursing Notes Reviewed/ Care Coordinated, and agree without changes. Applicable Imaging Reviewed.  Interpretation of Laboratory Data incorporated into ED treatment  Plan: Admit    Richarda Blade, MD 04/29/13 1555

## 2013-04-29 NOTE — ED Notes (Signed)
Attempted to call report to floor.  Nurse unavailable at this time.  Will call back after she gets out of contact room.  Charge nurse hanging chemo, not available either.

## 2013-04-30 DIAGNOSIS — D696 Thrombocytopenia, unspecified: Secondary | ICD-10-CM

## 2013-04-30 DIAGNOSIS — E440 Moderate protein-calorie malnutrition: Secondary | ICD-10-CM | POA: Insufficient documentation

## 2013-04-30 DIAGNOSIS — L0390 Cellulitis, unspecified: Secondary | ICD-10-CM

## 2013-04-30 DIAGNOSIS — C228 Malignant neoplasm of liver, primary, unspecified as to type: Secondary | ICD-10-CM

## 2013-04-30 DIAGNOSIS — D649 Anemia, unspecified: Secondary | ICD-10-CM

## 2013-04-30 DIAGNOSIS — R609 Edema, unspecified: Secondary | ICD-10-CM

## 2013-04-30 DIAGNOSIS — L0291 Cutaneous abscess, unspecified: Secondary | ICD-10-CM

## 2013-04-30 DIAGNOSIS — K7030 Alcoholic cirrhosis of liver without ascites: Secondary | ICD-10-CM

## 2013-04-30 LAB — CBC
HEMATOCRIT: 31.1 % — AB (ref 39.0–52.0)
HEMOGLOBIN: 10.7 g/dL — AB (ref 13.0–17.0)
MCH: 32.8 pg (ref 26.0–34.0)
MCHC: 34.4 g/dL (ref 30.0–36.0)
MCV: 95.4 fL (ref 78.0–100.0)
Platelets: 31 10*3/uL — ABNORMAL LOW (ref 150–400)
RBC: 3.26 MIL/uL — AB (ref 4.22–5.81)
RDW: 14.2 % (ref 11.5–15.5)
WBC: 5.6 10*3/uL (ref 4.0–10.5)

## 2013-04-30 LAB — GLUCOSE, CAPILLARY
GLUCOSE-CAPILLARY: 181 mg/dL — AB (ref 70–99)
GLUCOSE-CAPILLARY: 216 mg/dL — AB (ref 70–99)
GLUCOSE-CAPILLARY: 399 mg/dL — AB (ref 70–99)
Glucose-Capillary: 291 mg/dL — ABNORMAL HIGH (ref 70–99)

## 2013-04-30 LAB — BASIC METABOLIC PANEL
BUN: 27 mg/dL — AB (ref 6–23)
CHLORIDE: 100 meq/L (ref 96–112)
CO2: 27 mEq/L (ref 19–32)
Calcium: 8.5 mg/dL (ref 8.4–10.5)
Creatinine, Ser: 1.22 mg/dL (ref 0.50–1.35)
GFR calc non Af Amer: 56 mL/min — ABNORMAL LOW (ref >90)
GFR, EST AFRICAN AMERICAN: 65 mL/min — AB (ref >90)
Glucose, Bld: 333 mg/dL — ABNORMAL HIGH (ref 70–99)
Potassium: 3.9 mEq/L (ref 3.7–5.3)
SODIUM: 137 meq/L (ref 137–147)

## 2013-04-30 LAB — URINE CULTURE
COLONY COUNT: NO GROWTH
CULTURE: NO GROWTH
Special Requests: NORMAL

## 2013-04-30 LAB — AMMONIA: Ammonia: 43 umol/L (ref 11–60)

## 2013-04-30 MED ORDER — INSULIN ASPART 100 UNIT/ML ~~LOC~~ SOLN
0.0000 [IU] | Freq: Three times a day (TID) | SUBCUTANEOUS | Status: DC
  Administered 2013-05-01 (×2): 5 [IU] via SUBCUTANEOUS
  Administered 2013-05-01: 8 [IU] via SUBCUTANEOUS
  Administered 2013-05-02: 11 [IU] via SUBCUTANEOUS
  Administered 2013-05-02: 5 [IU] via SUBCUTANEOUS

## 2013-04-30 MED ORDER — INSULIN ASPART 100 UNIT/ML ~~LOC~~ SOLN: 25.0000 [IU] | Freq: Every day | SUBCUTANEOUS | Status: DC

## 2013-04-30 MED ORDER — INSULIN ASPART 100 UNIT/ML ~~LOC~~ SOLN
10.0000 [IU] | Freq: Every day | SUBCUTANEOUS | Status: DC
  Administered 2013-05-01 – 2013-05-04 (×4): 10 [IU] via SUBCUTANEOUS

## 2013-04-30 MED ORDER — INSULIN ASPART 100 UNIT/ML ~~LOC~~ SOLN
20.0000 [IU] | Freq: Every day | SUBCUTANEOUS | Status: DC
  Administered 2013-05-01 – 2013-05-03 (×3): 20 [IU] via SUBCUTANEOUS

## 2013-04-30 MED ORDER — INSULIN ASPART 100 UNIT/ML ~~LOC~~ SOLN
0.0000 [IU] | Freq: Every day | SUBCUTANEOUS | Status: DC
  Administered 2013-05-01: 2 [IU] via SUBCUTANEOUS

## 2013-04-30 MED ORDER — INSULIN ASPART 100 UNIT/ML ~~LOC~~ SOLN
5.0000 [IU] | Freq: Every day | SUBCUTANEOUS | Status: DC
  Administered 2013-05-01 – 2013-05-04 (×4): 5 [IU] via SUBCUTANEOUS

## 2013-04-30 MED ORDER — GLUCERNA SHAKE PO LIQD
237.0000 mL | Freq: Two times a day (BID) | ORAL | Status: DC
  Administered 2013-04-30 – 2013-05-04 (×6): 237 mL via ORAL
  Filled 2013-04-30 (×9): qty 237

## 2013-04-30 MED ORDER — INSULIN ASPART 100 UNIT/ML ~~LOC~~ SOLN: 20.0000 [IU] | Freq: Every day | SUBCUTANEOUS | Status: DC

## 2013-04-30 MED ORDER — INSULIN ASPART 100 UNIT/ML ~~LOC~~ SOLN: 5.0000 [IU] | Freq: Every day | SUBCUTANEOUS | Status: DC

## 2013-04-30 MED ORDER — INSULIN ASPART 100 UNIT/ML ~~LOC~~ SOLN
5.0000 [IU] | Freq: Every day | SUBCUTANEOUS | Status: DC
Start: 2013-05-01 — End: 2013-04-30

## 2013-04-30 MED ORDER — INSULIN GLARGINE 100 UNIT/ML ~~LOC~~ SOLN
25.0000 [IU] | Freq: Every day | SUBCUTANEOUS | Status: DC
  Administered 2013-04-30 – 2013-05-03 (×4): 25 [IU] via SUBCUTANEOUS
  Filled 2013-04-30 (×5): qty 0.25

## 2013-04-30 NOTE — Progress Notes (Signed)
Birch R Wessell   DOB:[birth year]   [id number]   [id number]  Subjective: Patient reports improvement in his right leg.  He is ambulating with the assistance.  His wife [name] is at his bedside.  He denies acute shortness of breath.   Objective:  Filed Vitals:   04/30/13 1335  BP: 120/48  Pulse: 53  Temp: 98 F (36.7 C)  Resp: 16    Body mass index is 30.17 kg/(m^2).  Intake/Output Summary (Last 24 hours) at 04/30/13 1402 Last data filed at 04/30/13 1235  Gross per 24 hour  Intake   1260 ml  Output    721 ml  Net    539 ml    Chronically ill appearing male; NAD. Non-toxic  Sclerae unicteric  Oropharynx clear  Lungs clear -- no rales or rhonchi  Heart regular rate and rhythm  Abdomen obese, +BS  MSK RLE edema greater than L, with erythema  Neuro nonfocal   CBG (last 3)   Recent Labs  04/30/13 0041 04/30/13 0738 04/30/13 1144  GLUCAP 399* 291* 181*     Labs:  Lab Results  Component Value Date   WBC 5.6 04/30/2013   HGB 10.7* 04/30/2013   HCT 31.1* 04/30/2013   MCV 95.4 04/30/2013   PLT 31* 04/30/2013   NEUTROABS 6.0 05/24/3534    Basic Metabolic Panel:  Recent Labs Lab 04/29/13 1305 04/30/13 0400  NA 140 137  K 4.0 3.9  CL 100 100  CO2 26 27  GLUCOSE 291* 333*  BUN 23 27*  CREATININE 1.32 1.22  CALCIUM 9.2 8.5   GFR Estimated Creatinine Clearance: 69.9 ml/min (by C-G formula based on Cr of 1.22). Liver Function Tests:  Recent Labs Lab 04/29/13 1305  AST 22  ALT 12  ALKPHOS 60  BILITOT 1.9*  PROT 6.0  ALBUMIN 3.2*    Recent Labs Lab 04/29/13 1305  LIPASE 13    Recent Labs Lab 04/30/13 0400  AMMONIA 43   CBC:  Recent Labs Lab 04/29/13 1305 04/30/13 0400  WBC 7.1 5.6  NEUTROABS 6.0  --   HGB 11.8* 10.7*  HCT 34.4* 31.1*  MCV 95.6 95.4  PLT 41* 31*     Recent Labs Lab 04/30/13 0041 04/30/13 0738 04/30/13 1144  GLUCAP 399* 291* 181*   DMicrobiology Recent Results (from the past 240 hour(s))  CULTURE, BLOOD  (ROUTINE X 2)     Status: None   Collection Time    04/29/13  1:05 PM      Result Value Ref Range Status   Specimen Description BLOOD RIGHT WRIST   Final   Special Requests BOTTLES DRAWN AEROBIC AND ANAEROBIC 5CC EACH   Final   Culture  Setup Time     Final   Value: 04/29/2013 20:55     Performed at [facility]   Culture     Final   Value: GRAM POSITIVE COCCI IN CLUSTERS     Note: Gram Stain Report Called to,Read Back By and Verified With: IFEOMA O@12 :24PM ON 04/30/13 BY DANTS     Performed at [facility]   Report Status PENDING   Incomplete  CULTURE, BLOOD (ROUTINE X 2)     Status: None   Collection Time    04/29/13  1:51 PM      Result Value Ref Range Status   Specimen Description BLOOD RIGHT HAND   Final   Special Requests BOTTLES DRAWN AEROBIC AND ANAEROBIC [facility] EACH   Final   Culture  Setup  Time     Final   Value: 04/29/2013 20:55     Performed at [facility]   Culture     Final   Value:        BLOOD CULTURE RECEIVED NO GROWTH TO DATE CULTURE WILL BE HELD FOR 5 DAYS BEFORE ISSUING A FINAL NEGATIVE REPORT     Performed at [facility]   Report Status PENDING   Incomplete   Studies:  Dg Chest Port 1 View  (if Code Sepsis Called)  04/29/2013   CLINICAL DATA:  Fever, nausea and vomiting.  EXAM: PORTABLE CHEST - 1 VIEW  COMPARISON:  Chest x-ray 06/21/2012.  FINDINGS: Elevation of the left hemidiaphragm is unchanged. Lung volumes are normal. No consolidative airspace disease. No pleural effusions. Mild cephalization of the pulmonary vasculature, without frank pulmonary edema. Mild cardiomegaly. The patient is rotated to the right On today's exam, resulting in distortion of the mediastinal contours and reduced diagnostic sensitivity and specificity for mediastinal pathology. Atherosclerosis in the thoracic aorta. Status post median sternotomy for CABG.  IMPRESSION: 1. Cardiomegaly with pulmonary venous congestion, but no frank pulmonary edema. 2.  Atherosclerosis.   Electronically Signed   By: Vinnie Langton M.D.   On: 04/29/2013 14:03    Assessment: 78 y.o. history of newly diagnosed [place] admitted on 03/09 to the hospitalist service with worsening L swelling and AMS, found to have cellulitis of the right LE.    Plan:   1. Cellulitis of R lower extremity.  -- Vancomycin and zosyn per primary team.  Area demarcated for response.   -- He has one prior cellulitis of his lower extremity.    2. [place], newly diagnosed based on imaging in the setting of cirrhosis of liver --He has Intermediate (Stage B) HCC and may be a candidate for liver directed transarterial chemo or radioembolization depending on his ECOG status. He has sufficient hepatic reserve with a MELD of 13 and Br of 1.5 as calculated on labs from 1 month ago. He is following up with Dr. Robert Bellow to receive TACE.   3. Anemia/Thrombocytopenia.  --Plts low secondary #2 and he is without signs of bleeding presently. Hemoglobin 10.7 and plts of 31K.  Transfuse for bleeding.    We will follow this patient with you.   Telesa Jeancharles, MD 04/30/2013  2:02 PM

## 2013-04-30 NOTE — Progress Notes (Signed)
Inpatient Diabetes Program Recommendations  AACE/ADA: New Consensus Statement on Inpatient Glycemic Control (2013)  Target Ranges:  Prepandial:   less than 140 mg/dL      Peak postprandial:   less than 180 mg/dL (1-2 hours)      Critically ill patients:  140 - 180 mg/dL   Reason for Visit: Hyperglycemia  Diabetes history: Type 2DM Outpatient Diabetes medications: Novolog 20-10-40 units tidac Current orders for Inpatient glycemic control: Novolog sensitive tidwc and hs, Novolog 20-10-40 units tidac  Pt checks blood sugars 2x/day.  Sees Dr. Loanne Drilling for diabetes management. On Prednisone 5 mg QD at home. Eating approx 50% meal. Results for [name], [name] (MRN [id number]) as of 04/30/2013 09:57  Ref. Range 04/30/2013 00:41 04/30/2013 07:38  Glucose-Capillary Latest Range: 70-99 mg/dL 399 (H) 291 (H)  Results for [name], [name] (MRN [id number]) as of 04/30/2013 09:57  Ref. Range 04/29/2013 13:05 04/30/2013 04:00  Glucose Latest Range: 70-99 mg/dL 291 (H) 333 (H)  Results for [name], [name] (MRN [id number]) as of 04/30/2013 09:57  Ref. Range 04/01/2013 12:02  Hemoglobin A1C Latest Range: <5.7 % 6.9 (H)   Blood sugars elevated. ? Accuracy of HgbA1C with low H/H.  Inpatient Diabetes Program Recommendations Insulin - Basal: Begin Lantus 25 units QHS Correction (SSI): Increase Novolog to moderate tidwc and hs Insulin - Meal Coverage: Decrease meal coverage to 1/2 of home dose to 10 units at B, 5 units at L, and 20 units at D HgbA1C: 6.9% - controlled Diet: Please add CHO mod medium to heart healthy diet  Note: Will continue to follow. Thank you. Lorenda Peck, RD, LDN, CDE Inpatient Diabetes Coordinator [phone]

## 2013-04-30 NOTE — Progress Notes (Signed)
CRITICAL VALUE ALERT  Critical value received:  Gram Positive cocci in clusters  Date of notification:  04/30/13  Time of notification:  1225  Critical value read back:yes  Nurse who received alert:  Adelfa Koh  MD notified (1st page):  Dr. Doyle Askew  Time of first page:  1246  MD notified (2nd page):  Time of second page:  Responding MD:  Dr. Doyle Askew  Time MD responded:  1248

## 2013-04-30 NOTE — Progress Notes (Signed)
INITIAL NUTRITION ASSESSMENT  DOCUMENTATION CODES Per approved criteria  -Non-severe (moderate) malnutrition in the context of chronic illness   INTERVENTION:  Glucerna Shake BID, each supplement provides 220 kcal and 10 grams protein  Recommend to modify to Heart Healthy/Carb Modified diet to assist in blood glucose control  NUTRITION DIAGNOSIS: Inadequate oral intake related to chronic illness as evidenced by patient report of PO intake and weight loss.   Goal: Patient to meet >/= 90% of estimated nutrition needs.  Monitor:  PO intake and supplement acceptance, I/Os, weight trends, labs  Reason for Assessment: Malnutrition Screening Tool Risk  78 y.o. male  Admitting Dx: Cellulitis, fevers  ASSESSMENT: Patient is a 78 y.o. male with multiple and complex medical history including HTN, HLD, DM type II, atrial fibrillation, chronic and recurrent LE cellulitis, OSA, diastolic CHF who presented to [facility] ED with main concern of several days duration of Intermittent fevers as high as 101 F, associate with progressively worsening LE swelling and erythema, right > left LE, tender and warm to touch, associated with throbbing and constant pain, 5/10 in severity, non radiating, worse with ambulation and with no specific alleviating factors. Pt denies chest pain or shortness of breath, no new specific abdominal or urinary concerns. He explains he has somewhat chronic abdominal pain and nausea and was recently diagnosed with liver cancer.   Patient reports a consistent weight loss over the past 6 months. Patient stated his usual weight is around 280 lb, which he reported was 6 months ago. Per flow sheet records patient with a confirmed 7.5% weight loss in 3 months. Patient attributes weight loss to poor appetite with nausea, vomiting, and issues with swallowing.  Patient usually eats 2 meals per day at home. Patient eats a late breakfast (bacon and egg sandwich or sausage and egg sandwich) and  dinner (eats out at steakhouse/restaurants or at home with foods like pork chop, salad, baked potato). Patient reported eating 100% of dinner on 3/9. Patient completed 50% of breakfast this am. Patient was eating lunch upon dietetic intern arrival (chicken salad sandwich with baked potato chips). Given recent cancer diagnosis patient likely not meeting his increased nutrient needs. Patient agreeable to Glucerna Shakes.   CBG elevated: 399, 291  Nutrition Focused Physical Exam:  Subcutaneous Fat:  Orbital Region: WNL Upper Arm Region: mild depletion Thoracic and Lumbar Region: WNL  Muscle:  Temple Region: WNL Clavicle Bone Region: mild depletion Clavicle and Acromion Bone Region: WNL Scapular Bone Region: WNL Dorsal Hand: WNL Patellar Region: WNL Anterior Thigh Region: WNL Posterior Calf Region: moderate depletion  Edema: +1 RLE  Patient meets criteria for non-severe (moderate) malnutrition in the context of chronic illness as evidenced by 7.5% weight loss in 3 months and </75% of energy intake for >/= 1 month.   Height: Ht Readings from Last 1 Encounters:  04/29/13 6\' 3"  (1.905 m)    Weight: Wt Readings from Last 1 Encounters:  04/29/13 241 lb 6.5 oz (109.5 kg)    Ideal Body Weight: 196 lb (89.1 kg)  % Ideal Body Weight: 123%  Wt Readings from Last 10 Encounters:  04/29/13 241 lb 6.5 oz (109.5 kg)  04/23/13 242 lb 6.4 oz (109.952 kg)  04/17/13 245 lb (111.131 kg)  04/01/13 254 lb (115.214 kg)  03/20/13 249 lb (112.946 kg)  03/07/13 250 lb (113.399 kg)  02/26/13 256 lb (116.121 kg)  02/07/13 260 lb (117.935 kg)  02/04/13 260 lb (117.935 kg)  01/22/13 252 lb (114.306 kg)  Usual Body Weight: 280 lb (127.3 kg)  % Usual Body Weight: 86%  BMI:  Body mass index is 30.17 kg/(m^2).  Estimated Nutritional Needs: Kcal: 2250-2450 Protein: 110-120 gram Fluid: 2.2-2.4 L  Skin: intact, wounds on legs  Diet Order: Cardiac  EDUCATION NEEDS: -No education needs  identified at this time   Intake/Output Summary (Last 24 hours) at 04/30/13 1131 Last data filed at 04/30/13 0815  Gross per 24 hour  Intake    900 ml  Output    720 ml  Net    180 ml    Last BM: PTA  Labs:   Recent Labs Lab 04/29/13 1305 04/30/13 0400  NA 140 137  K 4.0 3.9  CL 100 100  CO2 26 27  BUN 23 27*  CREATININE 1.32 1.22  CALCIUM 9.2 8.5  GLUCOSE 291* 333*    CBG (last 3)   Recent Labs  04/30/13 0041 04/30/13 0738  GLUCAP 399* 291*    Scheduled Meds: . allopurinol  150 mg Oral Daily  . enoxaparin (LOVENOX) injection  40 mg Subcutaneous Q24H  . fentaNYL  100 mcg Transdermal Q72H  . furosemide  40 mg Oral BID  . gabapentin  300 mg Oral TID  . insulin aspart  0-5 Units Subcutaneous QHS  . insulin aspart  0-9 Units Subcutaneous TID WC  . insulin aspart  20 Units Subcutaneous Q breakfast   And  . insulin aspart  10 Units Subcutaneous Q lunch   And  . insulin aspart  40 Units Subcutaneous Q supper  . isosorbide mononitrate  60 mg Oral Daily  . lactulose  20 g Oral BID  . loratadine  10 mg Oral Daily  . metoprolol tartrate  12.5 mg Oral BID  . omeprazole  20 mg Oral Daily  . piperacillin-tazobactam (ZOSYN)  IV  3.375 g Intravenous Q8H  . predniSONE  5 mg Oral Q breakfast  . rifaximin  550 mg Oral BID  . spironolactone  50 mg Oral Daily  . vancomycin  1,500 mg Intravenous Q24H  . vitamin C  500 mg Oral Daily    Continuous Infusions:   Past Medical History  Diagnosis Date  . Diverticulosis   . Gastric antral vascular ectasia   . Rhinophyma   . Melanoma     resected by [facility] 2008  . CAD (coronary artery disease)     myoview 2007 no ischemia/ no ASA because of GI disease  . Atrial fibrillation     amiodarone in past, but problems and left off meds.  . Aortic stenosis     echo, September, 2010, mild aortic insufficiency  . Visual loss, one eye     right eye-ophthalmic arterial branch occlusion  . Internal hemorrhoids   . GERD  (gastroesophageal reflux disease)   . Hypertension   . Allergic rhinitis   . Anemia     multifactorial  . Cirrhosis, alcoholic   . Thrombocytopenia   . Bleeding esophageal varices   . Encephalopathy, unspecified   . Gout   . Short-segment Barrett's esophagus     suspected  . Venous insufficiency   . Ejection fraction     EF 55%, echo, September, 2010  . Carotid bruit     Dopplers okay in the past  . Skin cancer     Basal cell and squamous cell  . Cellulitis     recurrent to RLE/notes 06/21/2012  . CHF (congestive heart failure)   . Sinus bradycardia  January, 2014  . Diastolic CHF     January, 2014  . Complication of anesthesia     "quit breathing once a long time ago; dropped my BP too; it was after I was given Dilaudid" (06/21/2012)  . Heart murmur   . Anginal pain   . Shortness of breath     "stay that way most of the time now" (06/21/2012)  . OSA on CPAP   . Diabetes mellitus, type 2   . History of blood transfusion     "not related to OR" (06/21/2012)  . H/O hiatal hernia   . Osteoarthritis     "ALL OVER" (06/21/2012)  . Chronic lower back pain   . Aspiration into airway-silent on modified barium swallow 03/20/2013    Past Surgical History  Procedure Laterality Date  . Total knee arthroplasty Right   . Esophagogastroduodenoscopy  04/10/2008    esophageal varices, cardia varix  . Tips procedure  2/10  . Back surgery      X5 "for ruptured disc; last time did a fusion" (06/21/2012)  . Excisional hemorrhoidectomy    . Release scar contracture / graft repairs of hand      bilaterally   . Elbow surgery Right   . Hiatal hernia repair    . Colonoscopy w/ polypectomy  02/14/2007    adenomatous polyps, diverticulosis, internal hemorrhoids/rectal varices  . Cataract extraction, bilateral  January 2013  . Tonsillectomy    . Appendectomy    . Joint replacement    . Coronary artery bypass graft  1993    6 Bypass  . Cataract extraction w/ intraocular lens  implant,  bilateral      Claudell Kyle, Dietetic Intern Pager: [phone]  I have reviewed and agree with nutrition assessment completed by Claudell Kyle, [place] Clinical Dietitian [phone]

## 2013-04-30 NOTE — Progress Notes (Addendum)
Patient ID: [name], male   DOB: 23-Aug-1935, 78 y.o.   MRN: [id number] [facility] PROGRESS NOTE  [place] R Asper GBT:[id number] DOB: December 30, 1935 DOA: 04/29/2013 PCP: Unice Cobble, MD  Brief narrative: Patient is a 78 y.o. male with multiple and complex medical history including HTN, HLD, DM type II, atrial fibrillation, chronic and recurrent LE cellulitis, OSA, diastolic CHF who presented to [facility] ED with main concern of several days duration of Intermittent fevers as high as 101 F, associate with progressively worsening LE swelling and erythema, right > left LE, tender and warm to touch, associated with throbbing and constant pain, 5/10 in severity, non radiating, worse with ambulation and with no specific alleviating factors. Pt denies chest pain or shortness of breath, no new specific abdominal or urinary concerns. He explains he has somewhat chronic abdominal pain and nausea and was recently diagnosed with liver cancer.   In ED, pt was initially confused and unable to provide history but upon admission to the floor his mental status cleared up and was able to provide some details. Right LE swellign and erythema consistent with cellulitis and [facility] asked to admit for further evaluation and management.   Assessment and Plan:  Active Problems:  Cellulitis  - placed on broad spectrum ABX Vancomycin and Zosyn and will continue day #2 - cellulitis is slowly improving based on the outlined mark on his right LE - keep extremity elevated  - analgesia as needed for symptoms control  Diastolic CHF  - clinically compensated at this time  - close monitoring of volume status  - continue Lasix 40 mg BID as per home medical regimen  - weight 241 lbs this AM Diabetes type II  - continue Insulin lantus 25 U QHS  - appreciate diabetic educator input - increase novolog to moderate coverage with HS  - continue meal coverage  Acute encephalopathy  - possibly from liver cancer  - ammonia level is WNL  -  mental status is now at baseline  - continue Lactulose, Lasix, Spironolactone, Rifaximin  HCC - appreciate Dr. Boyce Medici input  Thrombocytopenia  - chronic  - no signs of active bleeding but Plt trending down - stop Lovenox and place on SCD HTN  - continue to monitor, currently SBP in 120's  Radiological Exams on Admission:   Dg Chest Port 1 View 04/29/2013 Cardiomegaly with pulmonary venous congestion, but no frank pulmonary edema.  Consultants:  Oncology  Antibiotics:  Vancomycin 3/9 -->  Zosyn 3/9 -->  Code Status: Full Family Communication: Pt and wife at bedside Disposition Plan: Home when medically stable  HPI/Subjective: No events overnight.   Objective: Filed Vitals:   04/30/13 0020 04/30/13 0454 04/30/13 1047 04/30/13 1335  BP: 129/40 113/43 122/55 120/48  Pulse: 65 61 60 53  Temp: 98.3 F (36.8 C) 98.6 F (37 C)  98 F (36.7 C)  TempSrc: Oral Oral  Oral  Resp: 16 16  16   Height:      Weight:      SpO2: 95% 95%  99%    Intake/Output Summary (Last 24 hours) at 04/30/13 1910 Last data filed at 04/30/13 1830  Gross per 24 hour  Intake   1790 ml  Output   1721 ml  Net     69 ml    Exam:   General:  Pt is alert, follows commands appropriately, not in acute distress  Cardiovascular: Regular rhythm, bradycardic, S1/S2, no murmurs, no rubs, no gallops  Respiratory: Clear to auscultation bilaterally, no  wheezing, no crackles, no rhonchi  Abdomen: Soft, non tender, non distended, bowel sounds present, no guarding  Extremities: Right lower extremity edema and erythema, improving, warm to touch and TTP   Neuro: Grossly nonfocal  Data Reviewed: Basic Metabolic Panel:  Recent Labs Lab 04/29/13 1305 04/30/13 0400  NA 140 137  K 4.0 3.9  CL 100 100  CO2 26 27  GLUCOSE 291* 333*  BUN 23 27*  CREATININE 1.32 1.22  CALCIUM 9.2 8.5   Liver Function Tests:  Recent Labs Lab 04/29/13 1305  AST 22  ALT 12  ALKPHOS 60  BILITOT 1.9*  PROT 6.0   ALBUMIN 3.2*    Recent Labs Lab 04/29/13 1305  LIPASE 13    Recent Labs Lab 04/30/13 0400  AMMONIA 43   CBC:  Recent Labs Lab 04/29/13 1305 04/30/13 0400  WBC 7.1 5.6  NEUTROABS 6.0  --   HGB 11.8* 10.7*  HCT 34.4* 31.1*  MCV 95.6 95.4  PLT 41* 31*   CBG:  Recent Labs Lab 04/30/13 0041 04/30/13 0738 04/30/13 1144 04/30/13 1645  GLUCAP 399* 291* 181* 216*    Recent Results (from the past 240 hour(s))  CULTURE, BLOOD (ROUTINE X 2)     Status: None   Collection Time    04/29/13  1:05 PM      Result Value Ref Range Status   Specimen Description BLOOD RIGHT WRIST   Final   Special Requests BOTTLES DRAWN AEROBIC AND ANAEROBIC 5CC EACH   Final   Culture  Setup Time     Final   Value: 04/29/2013 20:55     Performed at [facility]   Culture     Final   Value: GRAM POSITIVE COCCI IN CLUSTERS     Note: Gram Stain Report Called to,Read Back By and Verified With: IFEOMA O@12 :24PM ON 04/30/13 BY DANTS     Performed at [facility]   Report Status PENDING   Incomplete  CULTURE, BLOOD (ROUTINE X 2)     Status: None   Collection Time    04/29/13  1:51 PM      Result Value Ref Range Status   Specimen Description BLOOD RIGHT HAND   Final   Special Requests BOTTLES DRAWN AEROBIC AND ANAEROBIC 5CC EACH   Final   Culture  Setup Time     Final   Value: 04/29/2013 20:55     Performed at [facility]   Culture     Final   Value:        BLOOD CULTURE RECEIVED NO GROWTH TO DATE CULTURE WILL BE HELD FOR 5 DAYS BEFORE ISSUING A FINAL NEGATIVE REPORT     Performed at [facility]   Report Status PENDING   Incomplete  URINE CULTURE     Status: None   Collection Time    04/29/13  2:05 PM      Result Value Ref Range Status   Specimen Description URINE, CATHETERIZED   Final   Special Requests Normal   Final   Culture  Setup Time     Final   Value: 04/29/2013 21:53     Performed at [facility]     Final   Value: NO  GROWTH     Performed at [facility]   Culture     Final   Value: NO GROWTH     Performed at [facility]   Report Status 04/30/2013 FINAL  Final     Scheduled Meds: . allopurinol  150 mg Oral Daily  . enoxaparin (LOVENOX) injection  40 mg Subcutaneous Q24H  . feeding supplement (GLUCERNA SHAKE)  237 mL Oral BID BM  . fentaNYL  100 mcg Transdermal Q72H  . furosemide  40 mg Oral BID  . gabapentin  300 mg Oral TID  . insulin aspart  0-5 Units Subcutaneous QHS  . insulin aspart  0-9 Units Subcutaneous TID WC  . insulin aspart  20 Units Subcutaneous Q breakfast   And  . insulin aspart  10 Units Subcutaneous Q lunch   And  . insulin aspart  40 Units Subcutaneous Q supper  . isosorbide mononitrate  60 mg Oral Daily  . lactulose  20 g Oral BID  . loratadine  10 mg Oral Daily  . metoprolol tartrate  12.5 mg Oral BID  . omeprazole  20 mg Oral Daily  . piperacillin-tazobactam (ZOSYN)  IV  3.375 g Intravenous Q8H  . predniSONE  5 mg Oral Q breakfast  . rifaximin  550 mg Oral BID  . spironolactone  50 mg Oral Daily  . vancomycin  1,500 mg Intravenous Q24H  . vitamin C  500 mg Oral Daily   Continuous Infusions:  Faye Ramsay, MD  [facility] Pager [phone]  If 7PM-7AM, please contact night-coverage [email or web address] Password [email or web address] 04/30/2013, 7:10 PM   LOS: 1 day

## 2013-05-01 DIAGNOSIS — E1159 Type 2 diabetes mellitus with other circulatory complications: Secondary | ICD-10-CM

## 2013-05-01 DIAGNOSIS — I5030 Unspecified diastolic (congestive) heart failure: Secondary | ICD-10-CM

## 2013-05-01 DIAGNOSIS — I509 Heart failure, unspecified: Secondary | ICD-10-CM

## 2013-05-01 LAB — BASIC METABOLIC PANEL
BUN: 28 mg/dL — ABNORMAL HIGH (ref 6–23)
CALCIUM: 8.5 mg/dL (ref 8.4–10.5)
CO2: 29 mEq/L (ref 19–32)
CREATININE: 1.36 mg/dL — AB (ref 0.50–1.35)
Chloride: 100 mEq/L (ref 96–112)
GFR calc Af Amer: 57 mL/min — ABNORMAL LOW (ref >90)
GFR, EST NON AFRICAN AMERICAN: 49 mL/min — AB (ref >90)
GLUCOSE: 245 mg/dL — AB (ref 70–99)
Potassium: 3.9 mEq/L (ref 3.7–5.3)
SODIUM: 138 meq/L (ref 137–147)

## 2013-05-01 LAB — CBC
HCT: 31.2 % — ABNORMAL LOW (ref 39.0–52.0)
Hemoglobin: 10.1 g/dL — ABNORMAL LOW (ref 13.0–17.0)
MCH: 31.5 pg (ref 26.0–34.0)
MCHC: 32.4 g/dL (ref 30.0–36.0)
MCV: 97.2 fL (ref 78.0–100.0)
PLATELETS: 34 10*3/uL — AB (ref 150–400)
RBC: 3.21 MIL/uL — ABNORMAL LOW (ref 4.22–5.81)
RDW: 14.5 % (ref 11.5–15.5)
WBC: 3.8 10*3/uL — ABNORMAL LOW (ref 4.0–10.5)

## 2013-05-01 LAB — GLUCOSE, CAPILLARY
GLUCOSE-CAPILLARY: 274 mg/dL — AB (ref 70–99)
Glucose-Capillary: 96 mg/dL (ref 70–99)
Glucose-Capillary: 209 mg/dL — ABNORMAL HIGH (ref 70–99)
Glucose-Capillary: 289 mg/dL — ABNORMAL HIGH (ref 70–99)
Glucose-Capillary: 243 mg/dL — ABNORMAL HIGH (ref 70–99)
Glucose-Capillary: 227 mg/dL — ABNORMAL HIGH (ref 70–99)

## 2013-05-01 LAB — CULTURE, BLOOD (ROUTINE X 2)

## 2013-05-01 MED ORDER — FUROSEMIDE 40 MG PO TABS
60.0000 mg | ORAL_TABLET | Freq: Two times a day (BID) | ORAL | Status: DC
  Administered 2013-05-01 – 2013-05-04 (×6): 60 mg via ORAL
  Filled 2013-05-01 (×9): qty 1

## 2013-05-01 NOTE — Progress Notes (Signed)
Inpatient Diabetes Program Recommendations  AACE/ADA: New Consensus Statement on Inpatient Glycemic Control (2013)  Target Ranges:  Prepandial:   less than 140 mg/dL      Peak postprandial:   less than 180 mg/dL (1-2 hours)      Critically ill patients:  140 - 180 mg/dL   Reason for Visit: Hyperglycemia  Results for [name], [name] (MRN [id number]) as of 05/01/2013 10:34  Ref. Range 04/30/2013 11:44 04/30/2013 16:45 04/30/2013 21:36 05/01/2013 07:32 05/01/2013 09:20  Glucose-Capillary Latest Range: 70-99 mg/dL 181 (H) 216 (H) 96 209 (H) 289 (H)    Hyperglycemia in setting of cellulitis. Eating well.  Increase Novolog meal coverage insulin to: Breakfast - 30 units  Lunch - 8 units Dinner - 30 units  Increase Lantus to 30 units QHS Add CHO mod med to heart healthy diet.  Will continue to follow. Thank you. Lorenda Peck, RD, LDN, CDE Inpatient Diabetes Coordinator [phone]

## 2013-05-01 NOTE — Progress Notes (Signed)
[facility] PROGRESS NOTE  [name] MEQ:[id number] DOB: [birth year] DOA: 04/29/2013 PCP: Unice Cobble, MD  Assessment/Plan: Patient is a 78 y.o. male with multiple and complex medical history including HTN, HLD, DM type II, atrial fibrillation, chronic and recurrent LE cellulitis, OSA, diastolic CHF, liver cirrhosis s/p TIPS, probable liver CA who presented to [facility] ED with main concern of several days duration of Intermittent fevers as high as 101 F, associate with progressively worsening LE swelling and erythema, right > left LE,m admitted with cellulitis   Assessment and Plan:  1. Cellulitis  - placed on broad spectrum ABX Vancomycin and Zosyn and will continue day #2  - cellulitis is slowly improving based on the outlined mark on his right LE  - keep extremity elevated, increased lasix to improve BL edema ; obtain Leg [place] r/o DVT - analgesia as needed for symptoms control  2. Diastolic CHF  - clinically looks fluid overloaded;incerase  Lasix 40-->60 mg BID as per home medical regimen  3. Diabetes type II; uncontrolled  - continue insulin regimen;  - appreciate diabetic educator input  - increase novolog to moderate coverage with HS  - continue meal coverage  4. Acute encephalopathy  - possibly from liver cancer +opioids; titrate opioids  - ammonia level is WNL  - mental status is now at baseline  - continue Lactulose, Lasix, Spironolactone, Rifaximin  5. HCC,l;iver cirrhosis, s/p TIPS  - appreciate Dr. Boyce Medici input  6. Thrombocytopenia  - chronic  - no signs of active bleeding but Plt trending down  - stop Lovenox and place on SCD  7. HTN  - continue to monitor, currently SBP in 120's   D/w patient, his wife; Patient is DNR  Radiological Exams on Admission:  Dg Chest Port 1 View 04/29/2013 Cardiomegaly with pulmonary venous congestion, but no frank pulmonary edema.  Consultants:  Oncology  Antibiotics:  Vancomycin 3/9 -->  Zosyn 3/9 -->  Code Status: DNR Family  Communication: d/w aptient, his wife (indicate person spoken with, relationship, and if by phone, the number) Disposition Plan: pend clinical improvement    Consultants:  None   Procedures:  None   Antibiotics:  Vanc/zosyn 3/9<<<   (indicate start date, and stop date if known)  HPI/Subjective: alert  Objective: Filed Vitals:   05/01/13 0500  BP: 130/59  Pulse: 64  Temp: 98.5 F (36.9 C)  Resp: 18    Intake/Output Summary (Last 24 hours) at 05/01/13 1202 Last data filed at 05/01/13 0900  Gross per 24 hour  Intake   2090 ml  Output   1652 ml  Net    438 ml   Filed Weights   04/29/13 2110 05/01/13 0500  Weight: 109.5 kg (241 lb 6.5 oz) 109.6 kg (241 lb 10 oz)    Exam:   General:  alrt  Cardiovascular: s1,s2 rrr  Respiratory: few crackles in LL  Abdomen: soft, nt,nd   Musculoskeletal: LE edema, R leg erythema   Data Reviewed: Basic Metabolic Panel:  Recent Labs Lab 04/29/13 1305 04/30/13 0400 05/01/13 0347  NA 140 137 138  K 4.0 3.9 3.9  CL 100 100 100  CO2 26 27 29   GLUCOSE 291* 333* 245*  BUN 23 27* 28*  CREATININE 1.32 1.22 1.36*  CALCIUM 9.2 8.5 8.5   Liver Function Tests:  Recent Labs Lab 04/29/13 1305  AST 22  ALT 12  ALKPHOS 60  BILITOT 1.9*  PROT 6.0  ALBUMIN 3.2*    Recent Labs Lab 04/29/13 1305  LIPASE 13    Recent Labs Lab 04/30/13 0400  AMMONIA 43   CBC:  Recent Labs Lab 04/29/13 1305 04/30/13 0400 05/01/13 0347  WBC 7.1 5.6 3.8*  NEUTROABS 6.0  --   --   HGB 11.8* 10.7* 10.1*  HCT 34.4* 31.1* 31.2*  MCV 95.6 95.4 97.2  PLT 41* 31* 34*   Cardiac Enzymes: No results found for this basename: CKTOTAL, CKMB, CKMBINDEX, TROPONINI,  in the last 168 hours BNP (last 3 results)  Recent Labs  05/25/12 1149  PROBNP 521.2*   CBG:  Recent Labs Lab 04/30/13 1144 04/30/13 1645 04/30/13 2136 05/01/13 0732 05/01/13 0920  GLUCAP 181* 216* 96 209* 289*    Recent Results (from the past 240 hour(s))   CULTURE, BLOOD (ROUTINE X 2)     Status: None   Collection Time    04/29/13  1:05 PM      Result Value Ref Range Status   Specimen Description BLOOD RIGHT WRIST   Final   Special Requests BOTTLES DRAWN AEROBIC AND ANAEROBIC 5CC EACH   Final   Culture  Setup Time     Final   Value: 04/29/2013 20:55     Performed at [facility]   Culture     Final   Value: STAPHYLOCOCCUS SPECIES (COAGULASE NEGATIVE)     Note: THE SIGNIFICANCE OF ISOLATING THIS ORGANISM FROM A SINGLE SET OF BLOOD CULTURES WHEN MULTIPLE SETS ARE DRAWN IS UNCERTAIN. PLEASE NOTIFY THE MICROBIOLOGY DEPARTMENT WITHIN ONE WEEK IF SPECIATION AND SENSITIVITIES ARE REQUIRED.     Note: Gram Stain Report Called to,Read Back By and Verified With: IFEOMA O@12 :24PM ON 04/30/13 BY DANTS     Performed at [facility]   Report Status 05/01/2013 FINAL   Final  CULTURE, BLOOD (ROUTINE X 2)     Status: None   Collection Time    04/29/13  1:51 PM      Result Value Ref Range Status   Specimen Description BLOOD RIGHT HAND   Final   Special Requests BOTTLES DRAWN AEROBIC AND ANAEROBIC 5CC EACH   Final   Culture  Setup Time     Final   Value: 04/29/2013 20:55     Performed at [facility]   Culture     Final   Value:        BLOOD CULTURE RECEIVED NO GROWTH TO DATE CULTURE WILL BE HELD FOR 5 DAYS BEFORE ISSUING A FINAL NEGATIVE REPORT     Performed at [facility]   Report Status PENDING   Incomplete  URINE CULTURE     Status: None   Collection Time    04/29/13  2:05 PM      Result Value Ref Range Status   Specimen Description URINE, CATHETERIZED   Final   Special Requests Normal   Final   Culture  Setup Time     Final   Value: 04/29/2013 21:53     Performed at [facility]     Final   Value: NO GROWTH     Performed at [facility]   Culture     Final   Value: NO GROWTH     Performed at [facility]   Report Status 04/30/2013 FINAL   Final     Studies: Dg  Chest Port 1 View  (if Code Sepsis Called)  04/29/2013   CLINICAL DATA:  Fever, nausea and vomiting.  EXAM: PORTABLE CHEST - 1 VIEW  COMPARISON:  Chest x-ray 06/21/2012.  FINDINGS: Elevation of the left hemidiaphragm is unchanged. Lung volumes are normal. No consolidative airspace disease. No pleural effusions. Mild cephalization of the pulmonary vasculature, without frank pulmonary edema. Mild cardiomegaly. The patient is rotated to the right On today's exam, resulting in distortion of the mediastinal contours and reduced diagnostic sensitivity and specificity for mediastinal pathology. Atherosclerosis in the thoracic aorta. Status post median sternotomy for CABG.  IMPRESSION: 1. Cardiomegaly with pulmonary venous congestion, but no frank pulmonary edema. 2. Atherosclerosis.   Electronically Signed   By: Vinnie Langton M.D.   On: 04/29/2013 14:03    Scheduled Meds: . allopurinol  150 mg Oral Daily  . feeding supplement (GLUCERNA SHAKE)  237 mL Oral BID BM  . fentaNYL  100 mcg Transdermal Q72H  . furosemide  40 mg Oral BID  . gabapentin  300 mg Oral TID  . insulin aspart  0-15 Units Subcutaneous TID WC  . insulin aspart  0-5 Units Subcutaneous QHS  . insulin aspart  10 Units Subcutaneous Q breakfast  . insulin aspart  20 Units Subcutaneous Q supper   And  . insulin aspart  5 Units Subcutaneous Q lunch  . insulin glargine  25 Units Subcutaneous QHS  . isosorbide mononitrate  60 mg Oral Daily  . lactulose  20 g Oral BID  . loratadine  10 mg Oral Daily  . metoprolol tartrate  12.5 mg Oral BID  . omeprazole  20 mg Oral Daily  . piperacillin-tazobactam (ZOSYN)  IV  3.375 g Intravenous Q8H  . predniSONE  5 mg Oral Q breakfast  . rifaximin  550 mg Oral BID  . spironolactone  50 mg Oral Daily  . vancomycin  1,500 mg Intravenous Q24H  . vitamin C  500 mg Oral Daily   Continuous Infusions:   Active Problems:   Cellulitis   Malnutrition of moderate degree    Time spent: >35 minutes      Kinnie Feil  [facility] Pager [phone]. If 7PM-7AM, please contact night-coverage at [email or web address], password [facility] 05/01/2013, 12:02 PM  LOS: 2 days

## 2013-05-02 DIAGNOSIS — M7989 Other specified soft tissue disorders: Secondary | ICD-10-CM

## 2013-05-02 LAB — BASIC METABOLIC PANEL
BUN: 22 mg/dL (ref 6–23)
CHLORIDE: 102 meq/L (ref 96–112)
CO2: 28 meq/L (ref 19–32)
CREATININE: 1.33 mg/dL (ref 0.50–1.35)
Calcium: 8.5 mg/dL (ref 8.4–10.5)
GFR calc Af Amer: 59 mL/min — ABNORMAL LOW (ref >90)
GFR calc non Af Amer: 51 mL/min — ABNORMAL LOW (ref >90)
GLUCOSE: 255 mg/dL — AB (ref 70–99)
POTASSIUM: 3.7 meq/L (ref 3.7–5.3)
Sodium: 141 mEq/L (ref 137–147)

## 2013-05-02 LAB — CBC
HEMATOCRIT: 29.7 % — AB (ref 39.0–52.0)
HEMOGLOBIN: 9.9 g/dL — AB (ref 13.0–17.0)
MCH: 32.4 pg (ref 26.0–34.0)
MCHC: 33.3 g/dL (ref 30.0–36.0)
MCV: 97.1 fL (ref 78.0–100.0)
Platelets: 37 10*3/uL — ABNORMAL LOW (ref 150–400)
RBC: 3.06 MIL/uL — ABNORMAL LOW (ref 4.22–5.81)
RDW: 14.4 % (ref 11.5–15.5)
WBC: 3.2 10*3/uL — AB (ref 4.0–10.5)

## 2013-05-02 LAB — GLUCOSE, CAPILLARY
Glucose-Capillary: 231 mg/dL — ABNORMAL HIGH (ref 70–99)
Glucose-Capillary: 333 mg/dL — ABNORMAL HIGH (ref 70–99)
Glucose-Capillary: 264 mg/dL — ABNORMAL HIGH (ref 70–99)
Glucose-Capillary: 295 mg/dL — ABNORMAL HIGH (ref 70–99)

## 2013-05-02 LAB — VANCOMYCIN, TROUGH: Vancomycin Tr: 10.6 ug/mL (ref 10.0–20.0)

## 2013-05-02 MED ORDER — INSULIN ASPART 100 UNIT/ML ~~LOC~~ SOLN
0.0000 [IU] | Freq: Three times a day (TID) | SUBCUTANEOUS | Status: DC
  Administered 2013-05-03: 4 [IU] via SUBCUTANEOUS
  Administered 2013-05-03: 11 [IU] via SUBCUTANEOUS
  Administered 2013-05-03 – 2013-05-04 (×2): 4 [IU] via SUBCUTANEOUS
  Administered 2013-05-04: 15 [IU] via SUBCUTANEOUS

## 2013-05-02 MED ORDER — INSULIN ASPART 100 UNIT/ML ~~LOC~~ SOLN
0.0000 [IU] | Freq: Every day | SUBCUTANEOUS | Status: DC
  Administered 2013-05-02: 3 [IU] via SUBCUTANEOUS
  Administered 2013-05-03: 2 [IU] via SUBCUTANEOUS

## 2013-05-02 NOTE — Progress Notes (Signed)
PHARMACY BRIEF NOTE:  VANCOMYCIN  Vancomycin trough 10.6 on 1500 mg IV q24h Goal range = 10-15 for cellulitis. Serum creatinine 1.33  Assessment: -Vancomycin trough in goal range -Serum creatinine stable -Reports of clinical improvement in cellulitis noted  Plan: 1. Continue vancomycin 1500 mg IV q24h 2. Follow serum creatinine, clinical course.  Clayburn Pert, PharmD, BCPS Pager: [phone] 05/02/2013  8:44 PM

## 2013-05-02 NOTE — Progress Notes (Signed)
Patient ID: [name], male   DOB: Oct 09, 1935, 78 y.o.   MRN: [id number] Pt recently seen by IR service in consultation on 04/26/13 for possible y90 radioembolization of [place] (see note under IR rad eval). Recent admission events noted. Once pt is over acute illness will attempt to schedule for arterial mapping preY90 if all parties are in agreement. Pt /wife aware.

## 2013-05-02 NOTE — Progress Notes (Signed)
ANTIBIOTIC CONSULT NOTE - Follow Up  Pharmacy Consult for vancomycin and Zosyn Indication: cellulitis  Allergies  Allergen Reactions  . Shellfish-Derived Products     Rash & angioedema  . Amoxicillin-Pot Clavulanate     Unsure what reaction was but pt tolerated penicillin 05/25/2012  . Aspirin     REACTION: unable to tolerate due to cirrhosis and varices  . Buprenorphine Hcl-Naloxone Hcl Other (See Comments)    unresponsive  . Codeine     Rash    . Ezetimibe     REACTION: LEG/JOINT PAIN  . Hydromorphone Hcl     unknown  . Meperidine Hcl     unknown  . Morphine     unknown  . Morphine Sulfate     REACTION: unspecified  . Oxycodone     hallucinations  . Sulfonamide Derivatives     Patient Measurements: Body weight: 110kg on 04/23/13 IBW: 84.5kg Adjusted Body Weight: 94.7kg  Vital Signs: Temp: 98.5 F (36.9 C) (03/12 0610) Temp src: Oral (03/12 0610) BP: 130/59 mmHg (03/12 0610) Pulse Rate: 64 (03/12 1029)  Labs:  Recent Labs  04/30/13 0400 05/01/13 0347 05/02/13 0405  WBC 5.6 3.8* 3.2*  HGB 10.7* 10.1* 9.9*  PLT 31* 34* 37*  CREATININE 1.22 1.36* 1.33    Medical History: Past Medical History  Diagnosis Date  . Diverticulosis   . Gastric antral vascular ectasia   . Rhinophyma   . Melanoma     resected by [facility] 2008  . CAD (coronary artery disease)     myoview 2007 no ischemia/ no ASA because of GI disease  . Atrial fibrillation     amiodarone in past, but problems and left off meds.  . Aortic stenosis     echo, September, 2010, mild aortic insufficiency  . Visual loss, one eye     right eye-ophthalmic arterial branch occlusion  . Internal hemorrhoids   . GERD (gastroesophageal reflux disease)   . Hypertension   . Allergic rhinitis   . Anemia     multifactorial  . Cirrhosis, alcoholic   . Thrombocytopenia   . Bleeding esophageal varices   . Encephalopathy, unspecified   . Gout   . Short-segment Barrett's esophagus     suspected  . Venous  insufficiency   . Ejection fraction     EF 55%, echo, September, 2010  . Carotid bruit     Dopplers okay in the past  . Skin cancer     Basal cell and squamous cell  . Cellulitis     recurrent to RLE/notes 06/21/2012  . CHF (congestive heart failure)   . Sinus bradycardia     January, 2014  . Diastolic CHF     January, 2014  . Complication of anesthesia     "quit breathing once a long time ago; dropped my BP too; it was after I was given Dilaudid" (06/21/2012)  . Heart murmur   . Anginal pain   . Shortness of breath     "stay that way most of the time now" (06/21/2012)  . OSA on CPAP   . Diabetes mellitus, type 2   . History of blood transfusion     "not related to OR" (06/21/2012)  . H/O hiatal hernia   . Osteoarthritis     "ALL OVER" (06/21/2012)  . Chronic lower back pain   . Aspiration into airway-silent on modified barium swallow 03/20/2013    Medications:  Scheduled:  . allopurinol  150 mg Oral Daily  .  feeding supplement (GLUCERNA SHAKE)  237 mL Oral BID BM  . fentaNYL  100 mcg Transdermal Q72H  . furosemide  60 mg Oral BID  . gabapentin  300 mg Oral TID  . insulin aspart  0-15 Units Subcutaneous TID WC  . insulin aspart  0-5 Units Subcutaneous QHS  . insulin aspart  10 Units Subcutaneous Q breakfast  . insulin aspart  20 Units Subcutaneous Q supper   And  . insulin aspart  5 Units Subcutaneous Q lunch  . insulin glargine  25 Units Subcutaneous QHS  . isosorbide mononitrate  60 mg Oral Daily  . lactulose  20 g Oral BID  . loratadine  10 mg Oral Daily  . metoprolol tartrate  12.5 mg Oral BID  . omeprazole  20 mg Oral Daily  . piperacillin-tazobactam (ZOSYN)  IV  3.375 g Intravenous Q8H  . predniSONE  5 mg Oral Q breakfast  . rifaximin  550 mg Oral BID  . spironolactone  50 mg Oral Daily  . vancomycin  1,500 mg Intravenous Q24H  . vitamin C  500 mg Oral Daily   Infusions:    Assessment: 66 yoM admitted 3/9 with fever and recurrent cellulitis. Pt with Hx liver  cancer, not yet on treatment and also a Hx of diabetes. Patient is seen by ID for this recurrent cellulitis and is on penicillin V potassium 250mg  PO daily for prophylaxis. Pt also on chronic rifaximin for cirrhosis. Pharmacy has been consulted to dose vancomycin and Zosyn for cellulitis.   Noted patient received ceftriaxone 1g IV x1 at 1358  Patient received vancomycin 1g IV x1 at 1601  Antiinfectives PTA >> rifaximin >> PTA >> pen VK >> 3/9 3/9 >> ceftriaxone x1 3/9 >> vancomycin >> 3/9 >> Zosyn >>    Tmax: afebrile WBCs: 3.2, down Renal: SCr 1.33 (baseline ~1.2), CrCl 49 ml/min normlaized  Microbiology 3/9 blood x2: 1/2 CNS 3/9 urine: NGF   Goal of Therapy:  Vancomycin trough level 10-15 mcg/ml Eradication of infection  Plan:  1) Continue vancomycin 1500mg  IV q24 for now 2) Continue current Zosyn dosing 3) Will check a vanc trough prior to next dose tonight at [place], PharmD, BCPS Pager [phone] 05/02/2013 1:37 PM

## 2013-05-02 NOTE — Progress Notes (Signed)
*  PRELIMINARY RESULTS* Vascular Ultrasound Right lower extremity venous duplex has been completed.  Preliminary findings: No evidence of DVT.   Landry Mellow, RDMS, RVT  05/02/2013, 2:38 PM

## 2013-05-02 NOTE — Progress Notes (Signed)
Results for LASHAN, GLUTH (MRN [id number]) as of 05/02/2013 12:16  Ref. Range 05/02/2013 07:22 05/02/2013 11:36  Glucose-Capillary Latest Range: 70-99 mg/dL 231 (H) 333 (H)   CBGs remain elevated.  Recommend increasing Lantus to 30 units daily and iIncrease Novolog correction scale to RESISTANT TID and HS.  Will continue to follow.  Harvel Ricks RN BSN CDE

## 2013-05-02 NOTE — Progress Notes (Signed)
[facility] PROGRESS NOTE  [name] ZOX:[id number] DOB: January 05, 1936 DOA: 04/29/2013 PCP: Unice Cobble, MD  Assessment/Plan: Patient is a 78 y.o. male with multiple and complex medical history including HTN, HLD, DM type II, atrial fibrillation, chronic and recurrent LE cellulitis, OSA, diastolic CHF, liver cirrhosis s/p TIPS, probable liver CA who presented to [facility] ED with main concern of several days duration of Intermittent fevers as high as 101 F, associate with progressively worsening LE swelling and erythema, right > left LE,m admitted with cellulitis   Assessment and Plan:  Cellulitis  - placed on broad spectrum ABX Vancomycin and Zosyn and will continue day #3. Likely transition to oral on 3/13. - cellulitis is slowly improving based on the outlined mark on his right LE  - keep extremity elevated, increased lasix to improve BL edema ; obtain Leg [place] r/o DVT, preliminary read is negative - analgesia as needed for symptoms control  Diastolic CHF  - clinically looks fluid overloaded; incerase Lasix 40-->60 mg BID as per home medical regimen - Continue daily weights Diabetes type II; uncontrolled  - continue insulin regimen;  - appreciate diabetic educator input  - increase novolog to resistant coverage with HS  - continue meal coverage  Acute encephalopathy  - possibly from liver cancer +opioids; titrate opioids  - ammonia level is WNL  - mental status is now at baseline  - continue Lactulose, Lasix, Spironolactone, Rifaximin  HCC,liver cirrhosis, s/p TIPS  - appreciate Dr. Boyce Medici input  Diarrhea - patient is currently on lactulose, however he has been on this he went home without any diarrhea. He complains of a getting worse in the past couple days, is now watery. Check a C. difficile. Thrombocytopenia  - chronic  - no signs of active bleeding but Plt trending down  - stop Lovenox and place on SCD  HTN  - continue to monitor, currently SBP in 120's   Consultants:   Oncology   Antibiotics:  Vancomycin 3/9 -->  Zosyn 3/9 -->  Code Status: DNR Family Communication: d/w aptient, his wife Disposition Plan: pend clinical improvement, home ~2 days  Consultants:  None   Procedures:  None   Antibiotics:  Vanc/zosyn 3/9<<<  HPI/Subjective: alert  Objective: Filed Vitals:   05/02/13 1029  BP:   Pulse: 64  Temp:   Resp:     Intake/Output Summary (Last 24 hours) at 05/02/13 1205 Last data filed at 05/02/13 0745  Gross per 24 hour  Intake   1730 ml  Output   1175 ml  Net    555 ml   Filed Weights   04/29/13 2110 05/01/13 0500 05/02/13 0610  Weight: 109.5 kg (241 lb 6.5 oz) 109.6 kg (241 lb 10 oz) 110.9 kg (244 lb 7.8 oz)    Exam:  General:  alrt  Cardiovascular: s1,s2 rrr  Respiratory: few crackles in LL  Abdomen: soft, nt,nd   Musculoskeletal: LE edema, R leg erythema   Data Reviewed: Basic Metabolic Panel:  Recent Labs Lab 04/29/13 1305 04/30/13 0400 05/01/13 0347 05/02/13 0405  NA 140 137 138 141  K 4.0 3.9 3.9 3.7  CL 100 100 100 102  CO2 26 27 29 28   GLUCOSE 291* 333* 245* 255*  BUN 23 27* 28* 22  CREATININE 1.32 1.22 1.36* 1.33  CALCIUM 9.2 8.5 8.5 8.5   Liver Function Tests:  Recent Labs Lab 04/29/13 1305  AST 22  ALT 12  ALKPHOS 60  BILITOT 1.9*  PROT 6.0  ALBUMIN 3.2*  Recent Labs Lab 04/29/13 1305  LIPASE 13    Recent Labs Lab 04/30/13 0400  AMMONIA 43   CBC:  Recent Labs Lab 04/29/13 1305 04/30/13 0400 05/01/13 0347 05/02/13 0405  WBC 7.1 5.6 3.8* 3.2*  NEUTROABS 6.0  --   --   --   HGB 11.8* 10.7* 10.1* 9.9*  HCT 34.4* 31.1* 31.2* 29.7*  MCV 95.6 95.4 97.2 97.1  PLT 41* 31* 34* 37*   BNP (last 3 results)  Recent Labs  05/25/12 1149  PROBNP 521.2*   CBG:  Recent Labs Lab 05/01/13 0920 05/01/13 1211 05/01/13 1631 05/01/13 2202 05/02/13 0722  GLUCAP 289* 243* 274* 227* 231*    Recent Results (from the past 240 hour(s))  CULTURE, BLOOD (ROUTINE X  2)     Status: None   Collection Time    04/29/13  1:05 PM      Result Value Ref Range Status   Specimen Description BLOOD RIGHT WRIST   Final   Special Requests BOTTLES DRAWN AEROBIC AND ANAEROBIC 5CC EACH   Final   Culture  Setup Time     Final   Value: 04/29/2013 20:55     Performed at [facility]   Culture     Final   Value: STAPHYLOCOCCUS SPECIES (COAGULASE NEGATIVE)     Note: THE SIGNIFICANCE OF ISOLATING THIS ORGANISM FROM A SINGLE SET OF BLOOD CULTURES WHEN MULTIPLE SETS ARE DRAWN IS UNCERTAIN. PLEASE NOTIFY THE MICROBIOLOGY DEPARTMENT WITHIN ONE WEEK IF SPECIATION AND SENSITIVITIES ARE REQUIRED.     Note: Gram Stain Report Called to,Read Back By and Verified With: IFEOMA O@12 :24PM ON 04/30/13 BY DANTS     Performed at [facility]   Report Status 05/01/2013 FINAL   Final  CULTURE, BLOOD (ROUTINE X 2)     Status: None   Collection Time    04/29/13  1:51 PM      Result Value Ref Range Status   Specimen Description BLOOD RIGHT HAND   Final   Special Requests BOTTLES DRAWN AEROBIC AND ANAEROBIC 5CC EACH   Final   Culture  Setup Time     Final   Value: 04/29/2013 20:55     Performed at [facility]   Culture     Final   Value:        BLOOD CULTURE RECEIVED NO GROWTH TO DATE CULTURE WILL BE HELD FOR 5 DAYS BEFORE ISSUING A FINAL NEGATIVE REPORT     Performed at [facility]   Report Status PENDING   Incomplete  URINE CULTURE     Status: None   Collection Time    04/29/13  2:05 PM      Result Value Ref Range Status   Specimen Description URINE, CATHETERIZED   Final   Special Requests Normal   Final   Culture  Setup Time     Final   Value: 04/29/2013 21:53     Performed at [place]     Final   Value: NO GROWTH     Performed at [facility]   Culture     Final   Value: NO GROWTH     Performed at [facility]   Report Status 04/30/2013 FINAL   Final     Studies: No results found.  Scheduled  Meds: . allopurinol  150 mg Oral Daily  . feeding supplement (GLUCERNA SHAKE)  237 mL Oral BID BM  . fentaNYL  100  mcg Transdermal Q72H  . furosemide  60 mg Oral BID  . gabapentin  300 mg Oral TID  . insulin aspart  0-15 Units Subcutaneous TID WC  . insulin aspart  0-5 Units Subcutaneous QHS  . insulin aspart  10 Units Subcutaneous Q breakfast  . insulin aspart  20 Units Subcutaneous Q supper   And  . insulin aspart  5 Units Subcutaneous Q lunch  . insulin glargine  25 Units Subcutaneous QHS  . isosorbide mononitrate  60 mg Oral Daily  . lactulose  20 g Oral BID  . loratadine  10 mg Oral Daily  . metoprolol tartrate  12.5 mg Oral BID  . omeprazole  20 mg Oral Daily  . piperacillin-tazobactam (ZOSYN)  IV  3.375 g Intravenous Q8H  . predniSONE  5 mg Oral Q breakfast  . rifaximin  550 mg Oral BID  . spironolactone  50 mg Oral Daily  . vancomycin  1,500 mg Intravenous Q24H  . vitamin C  500 mg Oral Daily   Continuous Infusions:   Active Problems:   HYPERLIPIDEMIA   ANEMIA, IRON DEFICIENCY   THROMBOCYTOPENIA, CHRONIC   HYPERTENSION, ESSENTIAL NOS   GERD   CIRRHOSIS, ALCOHOLIC   CORONARY ARTERY BYPASS GRAFT, HX OF   CAD (coronary artery disease)   DM2 (diabetes mellitus, type 2)   Aortic stenosis   Diastolic CHF   Venous stasis of lower extremity   HCC (hepatocellular carcinoma)   Cellulitis   Malnutrition of moderate degree  Time spent: 35 minutes   Marzetta Board MD [facility] Pager [phone]. If 7PM-7AM, please contact night-coverage at [email or web address], password [facility] 05/02/2013, 12:05 PM  LOS: 3 days

## 2013-05-03 ENCOUNTER — Encounter (HOSPITAL_COMMUNITY): Payer: Self-pay | Admitting: Pharmacy Technician

## 2013-05-03 LAB — BASIC METABOLIC PANEL
BUN: 18 mg/dL (ref 6–23)
CALCIUM: 8.6 mg/dL (ref 8.4–10.5)
CO2: 33 mEq/L — ABNORMAL HIGH (ref 19–32)
CREATININE: 1.07 mg/dL (ref 0.50–1.35)
Chloride: 99 mEq/L (ref 96–112)
GFR, EST AFRICAN AMERICAN: 76 mL/min — AB (ref >90)
GFR, EST NON AFRICAN AMERICAN: 66 mL/min — AB (ref >90)
Glucose, Bld: 169 mg/dL — ABNORMAL HIGH (ref 70–99)
POTASSIUM: 3.4 meq/L — AB (ref 3.7–5.3)
Sodium: 142 mEq/L (ref 137–147)

## 2013-05-03 LAB — CBC
HCT: 31.3 % — ABNORMAL LOW (ref 39.0–52.0)
Hemoglobin: 10.3 g/dL — ABNORMAL LOW (ref 13.0–17.0)
MCH: 31.9 pg (ref 26.0–34.0)
MCHC: 32.9 g/dL (ref 30.0–36.0)
MCV: 96.9 fL (ref 78.0–100.0)
Platelets: 47 10*3/uL — ABNORMAL LOW (ref 150–400)
RBC: 3.23 MIL/uL — ABNORMAL LOW (ref 4.22–5.81)
RDW: 14.3 % (ref 11.5–15.5)
WBC: 3.6 10*3/uL — ABNORMAL LOW (ref 4.0–10.5)

## 2013-05-03 LAB — CLOSTRIDIUM DIFFICILE BY PCR: CDIFFPCR: POSITIVE — AB

## 2013-05-03 LAB — HEPATIC FUNCTION PANEL
ALT: 10 U/L (ref 0–53)
AST: 19 U/L (ref 0–37)
Albumin: 2.8 g/dL — ABNORMAL LOW (ref 3.5–5.2)
Alkaline Phosphatase: 47 U/L (ref 39–117)
BILIRUBIN TOTAL: 0.7 mg/dL (ref 0.3–1.2)
Total Protein: 5.5 g/dL — ABNORMAL LOW (ref 6.0–8.3)

## 2013-05-03 LAB — TYPE AND SCREEN
ABO/RH(D): A POS
ANTIBODY SCREEN: NEGATIVE

## 2013-05-03 LAB — GLUCOSE, CAPILLARY
GLUCOSE-CAPILLARY: 163 mg/dL — AB (ref 70–99)
Glucose-Capillary: 281 mg/dL — ABNORMAL HIGH (ref 70–99)
Glucose-Capillary: 171 mg/dL — ABNORMAL HIGH (ref 70–99)

## 2013-05-03 LAB — ABO/RH: ABO/RH(D): A POS

## 2013-05-03 MED ORDER — DOXYCYCLINE HYCLATE 100 MG PO TABS
100.0000 mg | ORAL_TABLET | Freq: Two times a day (BID) | ORAL | Status: DC
  Administered 2013-05-03 – 2013-05-04 (×3): 100 mg via ORAL
  Filled 2013-05-03 (×4): qty 1

## 2013-05-03 MED ORDER — ACETAMINOPHEN 325 MG PO TABS
650.0000 mg | ORAL_TABLET | Freq: Three times a day (TID) | ORAL | Status: DC | PRN
  Administered 2013-05-03: 650 mg via ORAL
  Filled 2013-05-03: qty 2

## 2013-05-03 MED ORDER — VANCOMYCIN 50 MG/ML ORAL SOLUTION
125.0000 mg | Freq: Four times a day (QID) | ORAL | Status: DC
  Administered 2013-05-03 – 2013-05-04 (×5): 125 mg via ORAL
  Filled 2013-05-03 (×8): qty 2.5

## 2013-05-03 MED ORDER — POTASSIUM CHLORIDE CRYS ER 20 MEQ PO TBCR
30.0000 meq | EXTENDED_RELEASE_TABLET | Freq: Once | ORAL | Status: AC
  Administered 2013-05-03: 30 meq via ORAL
  Filled 2013-05-03: qty 1

## 2013-05-03 NOTE — Consult Note (Signed)
HPI: [name] is an 78 y.o. male with history of liver cirrhosis who has had prior TIPS. He is also now found to have right lobe multifocal hepatoma, likely HCC. He was seen in clinic by Dr. Vernard Gambles in discussion for TACE or Y-90 radioembolization. He was determined a good candidate, however he has now been admitted for cellulitis of his right leg and placed on antibiotics. He has been improving but developed some watery diarrhea and is now positive for C.Diff. In the interim, IR has been able to coordinate and schedule pre Y-90 angiogram for Mon 3/16. Chart, PMHx, labs, and current meds reviewed.  Past Medical History:  Past Medical History  Diagnosis Date  . Diverticulosis   . Gastric antral vascular ectasia   . Rhinophyma   . Melanoma     resected by [facility] 2008  . CAD (coronary artery disease)     myoview 2007 no ischemia/ no ASA because of GI disease  . Atrial fibrillation     amiodarone in past, but problems and left off meds.  . Aortic stenosis     echo, September, 2010, mild aortic insufficiency  . Visual loss, one eye     right eye-ophthalmic arterial branch occlusion  . Internal hemorrhoids   . GERD (gastroesophageal reflux disease)   . Hypertension   . Allergic rhinitis   . Anemia     multifactorial  . Cirrhosis, alcoholic   . Thrombocytopenia   . Bleeding esophageal varices   . Encephalopathy, unspecified   . Gout   . Short-segment Barrett's esophagus     suspected  . Venous insufficiency   . Ejection fraction     EF 55%, echo, September, 2010  . Carotid bruit     Dopplers okay in the past  . Skin cancer     Basal cell and squamous cell  . Cellulitis     recurrent to RLE/notes 06/21/2012  . CHF (congestive heart failure)   . Sinus bradycardia     January, 2014  . Diastolic CHF     January, 2014  . Complication of anesthesia     "quit breathing once a long time ago; dropped my BP too; it was after I was given Dilaudid" (06/21/2012)  . Heart murmur   .  Anginal pain   . Shortness of breath     "stay that way most of the time now" (06/21/2012)  . OSA on CPAP   . Diabetes mellitus, type 2   . History of blood transfusion     "not related to OR" (06/21/2012)  . H/O hiatal hernia   . Osteoarthritis     "ALL OVER" (06/21/2012)  . Chronic lower back pain   . Aspiration into airway-silent on modified barium swallow 03/20/2013    Past Surgical History:  Past Surgical History  Procedure Laterality Date  . Total knee arthroplasty Right   . Esophagogastroduodenoscopy  04/10/2008    esophageal varices, cardia varix  . Tips procedure  2/10  . Back surgery      X5 "for ruptured disc; last time did a fusion" (06/21/2012)  . Excisional hemorrhoidectomy    . Release scar contracture / graft repairs of hand      bilaterally   . Elbow surgery Right   . Hiatal hernia repair    . Colonoscopy w/ polypectomy  02/14/2007    adenomatous polyps, diverticulosis, internal hemorrhoids/rectal varices  . Cataract extraction, bilateral  January 2013  . Tonsillectomy    . Appendectomy    .  Joint replacement    . Coronary artery bypass graft  1993    6 Bypass  . Cataract extraction w/ intraocular lens  implant, bilateral      Family History:  Family History  Problem Relation Age of Onset  . Alzheimer's disease Mother   . Cancer Father     GI (stomach?)  . Heart disease Sister     2 sisters  . Liver disease Sister   . Diabetes Brother   . Heart disease Brother     3 brothers  . Liver disease Brother   . Heart attack Other     sibling  . Melanoma Other     sibling  . Stroke Other     sibling  . Colon cancer Neg Hx     Social History:  reports that he quit smoking about 38 years ago. His smoking use included Cigarettes. He has a 40 pack-year smoking history. He has never used smokeless tobacco. He reports that he does not drink alcohol or use illicit drugs.  Allergies:  Allergies  Allergen Reactions  . Shellfish-Derived Products     Rash &  angioedema  . Amoxicillin-Pot Clavulanate     Unsure what reaction was but pt tolerated penicillin 05/25/2012  . Aspirin     REACTION: unable to tolerate due to cirrhosis and varices  . Buprenorphine Hcl-Naloxone Hcl Other (See Comments)    unresponsive  . Codeine     Rash    . Ezetimibe     REACTION: LEG/JOINT PAIN  . Hydromorphone Hcl     unknown  . Meperidine Hcl     unknown  . Morphine     unknown  . Morphine Sulfate     REACTION: unspecified  . Oxycodone     hallucinations  . Sulfonamide Derivatives     Medications: Current facility-administered medications:acetaminophen (TYLENOL) tablet 650 mg, 650 mg, Oral, Q8H PRN, Caren Griffins, MD, 650 mg at 05/03/13 1136;  allopurinol (ZYLOPRIM) tablet 150 mg, 150 mg, Oral, Daily, Theodis Blaze, MD, 150 mg at 05/03/13 0914;  doxycycline (VIBRA-TABS) tablet 100 mg, 100 mg, Oral, Q12H, Caren Griffins, MD, 100 mg at 05/03/13 1121 feeding supplement (GLUCERNA SHAKE) (GLUCERNA SHAKE) liquid 237 mL, 237 mL, Oral, BID BM, Hazle Coca, RD, 237 mL at 05/03/13 0914;  fentaNYL ([place] - dosed mcg/hr) 100 mcg, 100 mcg, Transdermal, Q72H, Theodis Blaze, MD, 100 mcg at 05/02/13 2143;  furosemide (LASIX) tablet 60 mg, 60 mg, Oral, BID, Kinnie Feil, MD, 60 mg at 05/03/13 0844 gabapentin (NEURONTIN) capsule 300 mg, 300 mg, Oral, TID, Theodis Blaze, MD, 300 mg at 05/03/13 0914;  hydrOXYzine (ATARAX/VISTARIL) tablet 25-50 mg, 25-50 mg, Oral, Q6H PRN, Theodis Blaze, MD;  insulin aspart (novoLOG) injection 0-20 Units, 0-20 Units, Subcutaneous, TID WC, Costin Karlyne Greenspan, MD, 11 Units at 05/03/13 1253;  insulin aspart (novoLOG) injection 0-5 Units, 0-5 Units, Subcutaneous, QHS, Caren Griffins, MD, 3 Units at 05/02/13 2148 insulin aspart (novoLOG) injection 10 Units, 10 Units, Subcutaneous, Q breakfast, Theodis Blaze, MD, 10 Units at 05/03/13 [phone];  insulin aspart (novoLOG) injection 20 Units, 20 Units, Subcutaneous, Q supper, Theodis Blaze, MD,  20 Units at 05/02/13 1742;  insulin aspart (novoLOG) injection 5 Units, 5 Units, Subcutaneous, Q lunch, Theodis Blaze, MD, 5 Units at 05/03/13 1254 insulin glargine (LANTUS) injection 25 Units, 25 Units, Subcutaneous, QHS, Theodis Blaze, MD, 25 Units at 05/02/13 2137;  isosorbide mononitrate (IMDUR) 24 hr tablet  60 mg, 60 mg, Oral, Daily, Theodis Blaze, MD, 60 mg at 05/03/13 0915;  lactulose (CHRONULAC) 10 GM/15ML solution 20 g, 20 g, Oral, BID, Theodis Blaze, MD, 20 g at 05/03/13 0914;  loratadine (CLARITIN) tablet 10 mg, 10 mg, Oral, Daily, Theodis Blaze, MD, 10 mg at 05/03/13 0915 metoprolol tartrate (LOPRESSOR) tablet 12.5 mg, 12.5 mg, Oral, BID, Theodis Blaze, MD, 12.5 mg at 05/03/13 0915;  nitroGLYCERIN (NITROSTAT) SL tablet 0.4 mg, 0.4 mg, Sublingual, Q5 min PRN, Theodis Blaze, MD;  omeprazole (PRILOSEC) capsule 20 mg, 20 mg, Oral, Daily, Theodis Blaze, MD, 20 mg at 05/03/13 0915;  ondansetron (ZOFRAN) injection 4 mg, 4 mg, Intravenous, Q6H PRN, Theodis Blaze, MD ondansetron [facility]) tablet 4 mg, 4 mg, Oral, Q6H PRN, Theodis Blaze, MD;  predniSONE (DELTASONE) tablet 5 mg, 5 mg, Oral, Q breakfast, Theodis Blaze, MD, 5 mg at 05/03/13 2952;  spironolactone (ALDACTONE) tablet 50 mg, 50 mg, Oral, Daily, Theodis Blaze, MD, 50 mg at 05/03/13 0914;  vancomycin (VANCOCIN) 50 mg/mL oral solution 125 mg, 125 mg, Oral, QID, Caren Griffins, MD, 125 mg at 05/03/13 1121 vitamin C (ASCORBIC ACID) tablet 500 mg, 500 mg, Oral, Daily, Theodis Blaze, MD, 500 mg at 05/03/13 8413  Please HPI for pertinent positives, otherwise complete 10 system ROS negative.  Physical Exam: BP 121/56  Pulse 69  Temp(Src) 98.6 F (37 C) (Oral)  Resp 16  Ht '6\' 3"'  (1.905 m)  Wt 243 lb 8 oz (110.451 kg)  BMI 30.44 kg/m2  SpO2 100% Body mass index is 30.44 kg/(m^2).   General Appearance:  Alert, cooperative, no distress, appears stated age  Head:  Normocephalic, without obvious abnormality, atraumatic  ENT: Unremarkable  Neck:  Supple, symmetrical, trachea midline  Lungs:   Clear to auscultation bilaterally, no w/r/r,   Heart:  Regular rate and rhythm, S1, S2 normal, no murmur, rub or gallop.  Abdomen:   Soft, non-tender, non distended.  Extremities: (R)LE with improving cellulitis, groin not involved.  Pulses: 2+ and symmetric femoral  Neurologic: Normal affect, no gross deficits.   Results for orders placed during the hospital encounter of 04/29/13 (from the past 48 hour(s))  GLUCOSE, CAPILLARY     Status: Abnormal   Collection Time    05/01/13  4:31 PM      Result Value Ref Range   Glucose-Capillary 274 (*) 70 - 99 mg/dL  GLUCOSE, CAPILLARY     Status: Abnormal   Collection Time    05/01/13 10:02 PM      Result Value Ref Range   Glucose-Capillary 227 (*) 70 - 99 mg/dL  CBC     Status: Abnormal   Collection Time    05/02/13  4:05 AM      Result Value Ref Range   WBC 3.2 (*) 4.0 - 10.5 K/uL   RBC 3.06 (*) 4.22 - 5.81 MIL/uL   Hemoglobin 9.9 (*) 13.0 - 17.0 g/dL   HCT 29.7 (*) 39.0 - 52.0 %   MCV 97.1  78.0 - 100.0 fL   MCH 32.4  26.0 - 34.0 pg   MCHC 33.3  30.0 - 36.0 g/dL   RDW 14.4  11.5 - 15.5 %   Platelets 37 (*) 150 - 400 K/uL   Comment: CONSISTENT WITH PREVIOUS RESULT  BASIC METABOLIC PANEL     Status: Abnormal   Collection Time    05/02/13  4:05 AM      Result Value Ref  Range   Sodium 141  137 - 147 mEq/L   Potassium 3.7  3.7 - 5.3 mEq/L   Chloride 102  96 - 112 mEq/L   CO2 28  19 - 32 mEq/L   Glucose, Bld 255 (*) 70 - 99 mg/dL   BUN 22  6 - 23 mg/dL   Creatinine, Ser 1.33  0.50 - 1.35 mg/dL   Calcium 8.5  8.4 - 10.5 mg/dL   GFR calc non Af Amer 51 (*) >90 mL/min   GFR calc Af Amer 59 (*) >90 mL/min   Comment: (NOTE)     The eGFR has been calculated using the CKD EPI equation.     This calculation has not been validated in all clinical situations.     eGFR's persistently <90 mL/min signify possible Chronic Kidney     Disease.  GLUCOSE, CAPILLARY     Status: Abnormal   Collection  Time    05/02/13  7:22 AM      Result Value Ref Range   Glucose-Capillary 231 (*) 70 - 99 mg/dL  GLUCOSE, CAPILLARY     Status: Abnormal   Collection Time    05/02/13 11:36 AM      Result Value Ref Range   Glucose-Capillary 333 (*) 70 - 99 mg/dL  CLOSTRIDIUM DIFFICILE BY PCR     Status: Abnormal   Collection Time    05/02/13  2:29 PM      Result Value Ref Range   C difficile by pcr POSITIVE (*) NEGATIVE   Comment: CRITICAL RESULT CALLED TO, READ BACK BY AND VERIFIED WITH:     SASOM G RN 05/03/13 0751 COSTELLO B     Performed at [place], CAPILLARY     Status: Abnormal   Collection Time    05/02/13  4:49 PM      Result Value Ref Range   Glucose-Capillary 264 (*) 70 - 99 mg/dL   Comment 1 Notify RN    VANCOMYCIN, [place]     Status: None   Collection Time    05/02/13  7:05 PM      Result Value Ref Range   Vancomycin Tr 10.6  10.0 - 20.0 ug/mL  GLUCOSE, CAPILLARY     Status: Abnormal   Collection Time    05/02/13  9:41 PM      Result Value Ref Range   Glucose-Capillary 295 (*) 70 - 99 mg/dL  CBC     Status: Abnormal   Collection Time    05/03/13  4:00 AM      Result Value Ref Range   WBC 3.6 (*) 4.0 - 10.5 K/uL   RBC 3.23 (*) 4.22 - 5.81 MIL/uL   Hemoglobin 10.3 (*) 13.0 - 17.0 g/dL   HCT 31.3 (*) 39.0 - 52.0 %   MCV 96.9  78.0 - 100.0 fL   MCH 31.9  26.0 - 34.0 pg   MCHC 32.9  30.0 - 36.0 g/dL   RDW 14.3  11.5 - 15.5 %   Platelets 47 (*) 150 - 400 K/uL   Comment: CONSISTENT WITH PREVIOUS RESULT  BASIC METABOLIC PANEL     Status: Abnormal   Collection Time    05/03/13  4:00 AM      Result Value Ref Range   Sodium 142  137 - 147 mEq/L   Potassium 3.4 (*) 3.7 - 5.3 mEq/L   Chloride 99  96 - 112 mEq/L   CO2 33 (*) 19 - 32 mEq/L  Glucose, Bld 169 (*) 70 - 99 mg/dL   BUN 18  6 - 23 mg/dL   Creatinine, Ser 1.07  0.50 - 1.35 mg/dL   Calcium 8.6  8.4 - 10.5 mg/dL   GFR calc non Af Amer 66 (*) >90 mL/min   GFR calc Af Amer 76 (*) >90 mL/min    Comment: (NOTE)     The eGFR has been calculated using the CKD EPI equation.     This calculation has not been validated in all clinical situations.     eGFR's persistently <90 mL/min signify possible Chronic Kidney     Disease.  HEPATIC FUNCTION PANEL     Status: Abnormal   Collection Time    05/03/13  4:00 AM      Result Value Ref Range   Total Protein 5.5 (*) 6.0 - 8.3 g/dL   Albumin 2.8 (*) 3.5 - 5.2 g/dL   AST 19  0 - 37 U/L   ALT 10  0 - 53 U/L   Alkaline Phosphatase 47  39 - 117 U/L   Total Bilirubin 0.7  0.3 - 1.2 mg/dL   Bilirubin, Direct <0.2  0.0 - 0.3 mg/dL   Indirect Bilirubin NOT CALCULATED  0.3 - 0.9 mg/dL  GLUCOSE, CAPILLARY     Status: Abnormal   Collection Time    05/03/13  7:16 AM      Result Value Ref Range   Glucose-Capillary 163 (*) 70 - 99 mg/dL  GLUCOSE, CAPILLARY     Status: Abnormal   Collection Time    05/03/13 11:59 AM      Result Value Ref Range   Glucose-Capillary 281 (*) 70 - 99 mg/dL   No results found.  Assessment/Plan Cirrhosis, s/p hx of TIPS Multifocal right lobe mass highly suspicious for [place] for visceral arteriogram with possible coil embolization and test doe of Y-90 radioembolization treatment on Mon 3/16. Procedure discussed with pt and wife. Thrombocytopenia, likely chronic from liver disease. Recent trend up (47) but may need PLTs prior to procedure, preferably as close as possible to procedure. CDiff--cont to have watery diarrhea but now on po Vanc. Cellulitis--improving  Possibility pt is discharged this weekend. If so, he will need to come back to [facility] Radiology[facility]Short Stay on Mon 3/16 at 0730 for 0930 procedure time. NPO p [place] except meds.   IR team will follow progress while he remains inpt and if discharged, will confirm plans for Monday with pt and family.  Ascencion Dike PA-C 05/03/2013, 1:02 PM

## 2013-05-03 NOTE — Progress Notes (Signed)
[facility] PROGRESS NOTE  [name] PJ:[id number] DOB: June 03, 1935 DOA: 04/29/2013 PCP: Unice Cobble, MD  Assessment/Plan: Patient is a 78 y.o. male with multiple and complex medical history including HTN, HLD, DM type II, atrial fibrillation, chronic and recurrent LE cellulitis, OSA, diastolic CHF, liver cirrhosis s/p TIPS, probable liver CA who presented to [facility] ED with main concern of several days duration of Intermittent fevers as high as 101 F, associate with progressively worsening LE swelling and erythema, right > left LE,m admitted with cellulitis   Assessment and Plan:   C. difficile diarrhea - Patient has been complaining of watery diarrhea for the past couple of days. C diff checked yesterday, and returned positive. Start by mouth vancomycin. Temporarily discontinue lactulose and rifaximin to prevent severe dehydration. Cellulitis  - placed on broad spectrum ABX Vancomycin and Zosyn and will continue day #4, transition to doxycycline on 3/13. - cellulitis is slowly improving based on the outlined mark on his right LE  - keep extremity elevated, increased lasix to improve BL edema ; obtain Leg [place] r/o DVT, negative - analgesia as needed for symptoms control  Diastolic CHF  - clinically looks fluid overloaded; incerase Lasix 40-->60 mg BID as per home medical regimen - Continue daily weights Diabetes type II; uncontrolled  - continue insulin regimen;  - appreciate diabetic educator input  - increase novolog to resistant coverage with HS  - continue meal coverage  Acute encephalopathy  - possibly from liver cancer +opioids; titrate opioids  - ammonia level is WNL  - mental status is now at baseline  - continue Lactulose, Lasix, Spironolactone, Rifaximin  HCC,liver cirrhosis, s/p TIPS  - appreciate Dr. Boyce Medici input  - Appreciate Radiology input. Patient to have planned procedure on Monday. Thrombocytopenia  - chronic  - no signs of active bleeding but Plt trending  down  - stop Lovenox and place on SCD  HTN  - continue to monitor, currently SBP in 120's   Consultants:  Oncology  Radiology  Code Status: DNR Family Communication: d/w aptient, his wife Disposition Plan: pend clinical improvement, home ~2 days  Consultants:  None   Procedures:  None   Antibiotics:  Vanc/zosyn 3/9<<<3/13  By mouth vancomycin 3/13>>  By mouth doxycycline 3/13>>  HPI/Subjective: Alert, without complaints. Still with watery diarrhea.  Objective: Filed Vitals:   05/03/13 0600  BP: 121/56  Pulse: 69  Temp: 98.6 F (37 C)  Resp: 16    Intake/Output Summary (Last 24 hours) at 05/03/13 1210 Last data filed at 05/03/13 0500  Gross per 24 hour  Intake    550 ml  Output   1200 ml  Net   -650 ml   Filed Weights   05/01/13 0500 05/02/13 0610 05/03/13 0600  Weight: 109.6 kg (241 lb 10 oz) 110.9 kg (244 lb 7.8 oz) 110.451 kg (243 lb 8 oz)    Exam:  General:  alrt  Cardiovascular: s1,s2 rrr  Respiratory: few crackles in LL  Abdomen: soft, nt,nd   Musculoskeletal: LE edema, R leg erythema   Data Reviewed: Basic Metabolic Panel:  Recent Labs Lab 04/29/13 1305 04/30/13 0400 05/01/13 0347 05/02/13 0405 05/03/13 0400  NA 140 137 138 141 142  K 4.0 3.9 3.9 3.7 3.4*  CL 100 100 100 102 99  CO2 26 27 29 28  33*  GLUCOSE 291* 333* 245* 255* 169*  BUN 23 27* 28* 22 18  CREATININE 1.32 1.22 1.36* 1.33 1.07  CALCIUM 9.2 8.5 8.5 8.5 8.6  Liver Function Tests:  Recent Labs Lab 04/29/13 1305 05/03/13 0400  AST 22 19  ALT 12 10  ALKPHOS 60 47  BILITOT 1.9* 0.7  PROT 6.0 5.5*  ALBUMIN 3.2* 2.8*    Recent Labs Lab 04/29/13 1305  LIPASE 13    Recent Labs Lab 04/30/13 0400  AMMONIA 43   CBC:  Recent Labs Lab 04/29/13 1305 04/30/13 0400 05/01/13 0347 05/02/13 0405 05/03/13 0400  WBC 7.1 5.6 3.8* 3.2* 3.6*  NEUTROABS 6.0  --   --   --   --   HGB 11.8* 10.7* 10.1* 9.9* 10.3*  HCT 34.4* 31.1* 31.2* 29.7* 31.3*  MCV  95.6 95.4 97.2 97.1 96.9  PLT 41* 31* 34* 37* 47*   BNP (last 3 results)  Recent Labs  05/25/12 1149  PROBNP 521.2*   CBG:  Recent Labs Lab 05/02/13 0722 05/02/13 1136 05/02/13 1649 05/02/13 2141 05/03/13 0716  GLUCAP 231* 333* 264* 295* 163*    Recent Results (from the past 240 hour(s))  CULTURE, BLOOD (ROUTINE X 2)     Status: None   Collection Time    04/29/13  1:05 PM      Result Value Ref Range Status   Specimen Description BLOOD RIGHT WRIST   Final   Special Requests BOTTLES DRAWN AEROBIC AND ANAEROBIC 5CC EACH   Final   Culture  Setup Time     Final   Value: 04/29/2013 20:55     Performed at [facility]   Culture     Final   Value: STAPHYLOCOCCUS SPECIES (COAGULASE NEGATIVE)     Note: THE SIGNIFICANCE OF ISOLATING THIS ORGANISM FROM A SINGLE SET OF BLOOD CULTURES WHEN MULTIPLE SETS ARE DRAWN IS UNCERTAIN. PLEASE NOTIFY THE MICROBIOLOGY DEPARTMENT WITHIN ONE WEEK IF SPECIATION AND SENSITIVITIES ARE REQUIRED.     Note: Gram Stain Report Called to,Read Back By and Verified With: IFEOMA O@12 :24PM ON 04/30/13 BY DANTS     Performed at [facility]   Report Status 05/01/2013 FINAL   Final  CULTURE, BLOOD (ROUTINE X 2)     Status: None   Collection Time    04/29/13  1:51 PM      Result Value Ref Range Status   Specimen Description BLOOD RIGHT HAND   Final   Special Requests BOTTLES DRAWN AEROBIC AND ANAEROBIC 5CC EACH   Final   Culture  Setup Time     Final   Value: 04/29/2013 20:55     Performed at [facility]   Culture     Final   Value:        BLOOD CULTURE RECEIVED NO GROWTH TO DATE CULTURE WILL BE HELD FOR 5 DAYS BEFORE ISSUING A FINAL NEGATIVE REPORT     Performed at [facility]   Report Status PENDING   Incomplete  URINE CULTURE     Status: None   Collection Time    04/29/13  2:05 PM      Result Value Ref Range Status   Specimen Description URINE, CATHETERIZED   Final   Special Requests Normal   Final   Culture  Setup  Time     Final   Value: 04/29/2013 21:53     Performed at [place]     Final   Value: NO GROWTH     Performed at [facility]   Culture     Final   Value: NO GROWTH     Performed  at [facility]   Report Status 04/30/2013 FINAL   Final  CLOSTRIDIUM DIFFICILE BY PCR     Status: Abnormal   Collection Time    05/02/13  2:29 PM      Result Value Ref Range Status   C difficile by pcr POSITIVE (*) NEGATIVE Final   Comment: CRITICAL RESULT CALLED TO, READ BACK BY AND VERIFIED WITH:     Erroll Luna RN 05/03/13 0751 COSTELLO B     Performed at [facility]     Studies: No results found.  Scheduled Meds: . allopurinol  150 mg Oral Daily  . doxycycline  100 mg Oral Q12H  . feeding supplement (GLUCERNA SHAKE)  237 mL Oral BID BM  . fentaNYL  100 mcg Transdermal Q72H  . furosemide  60 mg Oral BID  . gabapentin  300 mg Oral TID  . insulin aspart  0-20 Units Subcutaneous TID WC  . insulin aspart  0-5 Units Subcutaneous QHS  . insulin aspart  10 Units Subcutaneous Q breakfast  . insulin aspart  20 Units Subcutaneous Q supper   And  . insulin aspart  5 Units Subcutaneous Q lunch  . insulin glargine  25 Units Subcutaneous QHS  . isosorbide mononitrate  60 mg Oral Daily  . lactulose  20 g Oral BID  . loratadine  10 mg Oral Daily  . metoprolol tartrate  12.5 mg Oral BID  . omeprazole  20 mg Oral Daily  . predniSONE  5 mg Oral Q breakfast  . spironolactone  50 mg Oral Daily  . vancomycin  125 mg Oral QID  . vitamin C  500 mg Oral Daily   Continuous Infusions:   Active Problems:   HYPERLIPIDEMIA   ANEMIA, IRON DEFICIENCY   THROMBOCYTOPENIA, CHRONIC   HYPERTENSION, ESSENTIAL NOS   GERD   CIRRHOSIS, ALCOHOLIC   CORONARY ARTERY BYPASS GRAFT, HX OF   CAD (coronary artery disease)   DM2 (diabetes mellitus, type 2)   Aortic stenosis   Diastolic CHF   Venous stasis of lower extremity   HCC (hepatocellular carcinoma)   Cellulitis    Malnutrition of moderate degree  Time spent: 25 minutes   Marzetta Board MD [facility] Pager [phone]. If 7PM-7AM, please contact night-coverage at [email or web address], password [facility] 05/03/2013, 12:10 PM  LOS: 4 days

## 2013-05-04 LAB — CBC
HEMATOCRIT: 32.8 % — AB (ref 39.0–52.0)
Hemoglobin: 11 g/dL — ABNORMAL LOW (ref 13.0–17.0)
MCH: 32.2 pg (ref 26.0–34.0)
MCHC: 33.5 g/dL (ref 30.0–36.0)
MCV: 95.9 fL (ref 78.0–100.0)
Platelets: 57 10*3/uL — ABNORMAL LOW (ref 150–400)
RBC: 3.42 MIL/uL — ABNORMAL LOW (ref 4.22–5.81)
RDW: 14.2 % (ref 11.5–15.5)
WBC: 4.6 10*3/uL (ref 4.0–10.5)

## 2013-05-04 LAB — BASIC METABOLIC PANEL
BUN: 18 mg/dL (ref 6–23)
CHLORIDE: 100 meq/L (ref 96–112)
CO2: 31 meq/L (ref 19–32)
Calcium: 9 mg/dL (ref 8.4–10.5)
Creatinine, Ser: 1 mg/dL (ref 0.50–1.35)
GFR calc Af Amer: 83 mL/min — ABNORMAL LOW (ref >90)
GFR calc non Af Amer: 71 mL/min — ABNORMAL LOW (ref >90)
Glucose, Bld: 199 mg/dL — ABNORMAL HIGH (ref 70–99)
Potassium: 3.8 mEq/L (ref 3.7–5.3)
Sodium: 140 mEq/L (ref 137–147)

## 2013-05-04 LAB — GLUCOSE, CAPILLARY
GLUCOSE-CAPILLARY: 156 mg/dL — AB (ref 70–99)
GLUCOSE-CAPILLARY: 313 mg/dL — AB (ref 70–99)
Glucose-Capillary: 207 mg/dL — ABNORMAL HIGH (ref 70–99)

## 2013-05-04 MED ORDER — VANCOMYCIN 50 MG/ML ORAL SOLUTION: 125.0000 mg | Freq: Four times a day (QID) | ORAL | Status: DC

## 2013-05-04 MED ORDER — DOXYCYCLINE HYCLATE 100 MG PO TABS: 100.0000 mg | ORAL_TABLET | Freq: Two times a day (BID) | ORAL | Status: DC

## 2013-05-04 NOTE — Progress Notes (Signed)
NCM spoke to pt and [facility] [place]. Medication has to be compounded. [place] and they do not have in stock. They provided [place], they do have in stock but medication has to paid out of pocket, $148. The pt can send info to their insurance to get reimbursement. Faxed Rx to [facility]. Wife agreeable to pay $148. 00 out of pocket. [facility] [place]. Verified with pharmacy they do have in stock. Jonnie Finner RN CCM Case Mgmt phone [phone]

## 2013-05-04 NOTE — Discharge Instructions (Signed)
Please continue Doxycycline and oral vancomycin as instructed. You have an appointment with Dr. Linna Darner on 3/20, please make sure you do follow up with him. While you are taking Doxycycline and Vancomycin please stop taking Rifaximin and Penicillin and resume them as before once you finish your Vancomycin. Continue to take Lactulose as before since your diarrhea has resolved in the hospital.   You were cared for by a hospitalist during your hospital stay. If you have any questions about your discharge medications or the care you received while you were in the hospital after you are discharged, you can call the unit and asked to speak with the hospitalist on call if the hospitalist that took care of you is not available. Once you are discharged, your primary care physician will handle any further medical issues. Please note that NO REFILLS for any discharge medications will be authorized once you are discharged, as it is imperative that you return to your primary care physician (or establish a relationship with a primary care physician if you do not have one) for your aftercare needs so that they can reassess your need for medications and monitor your lab values.   Follow with Primary MD Unice Cobble, MD in 5-7 days   Get CBC, CMP checked by your doctor and again as further instructed.  Get a 2 view Chest X ray done next visit if you had Pneumonia of Lung problems at the [facility] reviewed and adjusted.  Please request your Prim.MD to go over all Hospital Tests and Procedure/Radiological results at the follow up, please get all Hospital records sent to your Prim MD by signing hospital release before you go home.  Activity: As tolerated with Full fall precautions use walker/cane & assistance as needed  Diet: diabetic  For Heart failure patients - Check your Weight same time everyday, if you gain over 2 pounds, or you develop in leg swelling, experience more shortness of breath or chest  pain, call your Primary MD immediately. Follow Cardiac Low Salt Diet and 1.8 lit/day fluid restriction.  Disposition Home  If you experience worsening of your admission symptoms, develop shortness of breath, life threatening emergency, suicidal or homicidal thoughts you must seek medical attention immediately by calling 911 or calling your MD immediately  if symptoms less severe.  You Must read complete instructions/literature along with all the possible adverse reactions/side effects for all the Medicines you take and that have been prescribed to you. Take any new Medicines after you have completely understood and accpet all the possible adverse reactions/side effects.   Do not drive and provide baby sitting services if your were admitted for syncope or siezures until you have seen by Primary MD or a Neurologist and advised to do so again.  Do not drive when taking Pain medications.   Do not take more than prescribed Pain, Sleep and Anxiety Medications  Special Instructions: If you have smoked or chewed Tobacco  in the last 2 yrs please stop smoking, stop any regular Alcohol  and or any Recreational drug use.  Wear Seat belts while driving.

## 2013-05-05 ENCOUNTER — Other Ambulatory Visit: Payer: Self-pay | Admitting: Radiology

## 2013-05-05 LAB — PREPARE PLATELET PHERESIS: Unit division: 0

## 2013-05-05 NOTE — Discharge Summary (Signed)
Physician Discharge Summary  [name] PJ:[id number] DOB: Sep 05, 1935 DOA: 04/29/2013  PCP: Unice Cobble, MD  Admit date: 04/29/2013 Discharge date: 05/05/2013  Time spent: 35 minutes  Recommendations for Outpatient Follow-up:  1. Follow up with PCP in 1 week 2. Follow up with interventional radiology as planned 3. Follow up with oncology as planned 4. Follow up with infectious disease as planned   Recommendations for primary care physician for things to follow:  Repeat BMP and CBC  Discharge Diagnoses:  Active Problems:   HYPERLIPIDEMIA   ANEMIA, IRON DEFICIENCY   THROMBOCYTOPENIA, CHRONIC   HYPERTENSION, ESSENTIAL NOS   GERD   CIRRHOSIS, ALCOHOLIC   CORONARY ARTERY BYPASS GRAFT, HX OF   CAD (coronary artery disease)   DM2 (diabetes mellitus, type 2)   Aortic stenosis   Diastolic CHF   Venous stasis of lower extremity   HCC (hepatocellular carcinoma)   Cellulitis   Malnutrition of moderate degree  Discharge Condition: stable  Diet recommendation: regular  Filed Weights   05/02/13 0610 05/03/13 0600 05/04/13 0434  Weight: 110.9 kg (244 lb 7.8 oz) 110.451 kg (243 lb 8 oz) 108.863 kg (240 lb)   History of present illness:  Patient is a 78 y.o. male with multiple and complex medical history including HTN, HLD, DM type II, atrial fibrillation, chronic and recurrent LE cellulitis, OSA, diastolic CHF who presented to [facility] ED with main concern of several days duration of Intermittent fevers as high as 101 F, associate with progressively worsening LE swelling and erythema, right > left LE, tender and warm to touch, associated with throbbing and constant pain, 5/10 in severity, non radiating, worse with ambulation and with no specific alleviating factors. Pt denies chest pain or shortness of breath, no new specific abdominal or urinary concerns. He explains he has somewhat chronic abdominal pain and nausea and was recently diagnosed with liver cancer. In ED, pt was initially  confused and unable to provide history but upon admission to the floor his mental status cleared up and was able to provide some details. Right LE swellign and erythema consistent with cellulitis and TRH asked to admit for further evaluation and management.   Hospital Course:  Cellulitis - patient was admitted with right lower extremity cellulitis, which has been somewhat acute on chronic for him, initially started on vancomycin and Zosyn with excellent initial response, his antibiotics were narrowed to doxycycline and he was discharged to complete a course as an outpatient. His chronic penicillin but he is taking aspirin infectious disease would be discontinued as long as he is on doxycycline, and after that he can resume it. Given swelling in the right lower extremity, patient underwent a DVT ultrasound which is negative. C. difficile diarrhea - patient has been complaining of watery diarrhea ever since he was hospitalized. C. difficile was checked and it returned positive. Patient was started on by mouth vancomycin, and he is to complete a 14 day course as an outpatient. I will hold his rifaximin for now, and he can restart it once the vancomycin is finished. His watery diarrhea has resolved upon initiation of by mouth vancomycin, and given his liver history I advised him to continue his lactulose. Patient was able to tolerate a regular diet without any abdominal pain, nausea or vomiting. Diastolic CHF - well compensated on discharge, he is to resume his previous regimen. Diabetes type II - well controlled, A1c 6.9. Resume his previous home regimen.  Acute encephalopathy - - possibly from liver cancer +opioids;  titrate opioids; Ammonia level is WNL. Mental status is now at baseline with initiation of antibiotics. HCC,liver cirrhosis, s/p TIPS. Appreciate Dr. Boyce Medici input. Appreciate Radiology input. Patient to have planned procedure on Monday.  Thrombocytopenia - chronic, slowly improving. We'll defer  to radiology whether they wish to platelet transfusion just prior to his procedure on Monday.  HTN - continue previous home regimen.  Procedures:  LE duplex - No evidence of deep vein thrombosis involving the right lower extremity. - No evidence of Baker's cyst on the right.   Consultations:  Interventional radiology  Oncology  Discharge Exam: Filed Vitals:   05/03/13 1519 05/03/13 2214 05/04/13 0434 05/04/13 0438  BP: 128/59 142/56  138/52  Pulse: 63 60  56  Temp: 97 F (36.1 C) 98 F (36.7 C)  98.1 F (36.7 C)  TempSrc: Oral Oral  Oral  Resp: 20 16  16   Height:      Weight:   108.863 kg (240 lb)   SpO2: 100% 97%  94%   General: No acute distress Cardiovascular: Regular rate and rhythm Respiratory: Clear to auscultation bilaterally  Discharge Instructions   Future Appointments Provider Department Dept Phone   05/06/2013 11:00 AM [facility] [phone]   NPO after midnight   05/06/2013 11:30 AM [facility] 1 [facility] [phone]   05/08/2013 10:00 AM [facility] [phone]   05/08/2013 10:30 AM [facility] Covering Provider [place] [facility] [phone]   05/10/2013 10:30 AM Hendricks Limes, MD [place] [phone]   05/15/2013 10:45 AM Carlena Bjornstad, MD [place] Office [phone]   05/29/2013 7:30 AM [facility] Room [place] [facility] [phone]   06/03/2013 9:15 AM Renato Shin, MD [facility] [phone]   06/06/2013 9:15 AM Trudie Buckler, [place] at [place]   07/22/2013 10:30 AM Campbell Riches, MD [facility] [phone]       Medication List         acetaminophen 500 MG tablet  Commonly known as:  TYLENOL  Take 1,000 mg by mouth every 6 (six) hours as needed  for mild pain.     OFIRMEV 10 MG/ML Soln  Generic drug:  acetaminophen  Inject 1,000 mg into the vein once.     allopurinol 300 MG tablet  Commonly known as:  ZYLOPRIM  Take 150 mg by mouth daily.     CALCIUM/MAGNESIUM/ZINC FORMULA 1000-500-50 MG Tabs  Generic drug:  Calcium-Magnesium-Zinc  Take 1 tablet by mouth daily.     doxycycline 100 MG tablet  Commonly known as:  VIBRA-TABS  Take 1 tablet (100 mg total) by mouth every 12 (twelve) hours.     fentaNYL 100 MCG/HR  Commonly known as:  DURAGESIC - dosed mcg/hr  Place 100 mcg onto the skin every 3 (three) days.     furosemide 40 MG tablet  Commonly known as:  LASIX  Take 1 tablet (40 mg total) by mouth 2 (two) times daily.     gabapentin 300 MG capsule  Commonly known as:  NEURONTIN  Take 1 capsule (300 mg total) by mouth 3 (three) times daily.     glucose blood test strip  Commonly known as:  FREESTYLE LITE  1 each by Other route 2 (two) times daily. And lancets: Dx code 250.01     hydrOXYzine 25 MG  tablet  Commonly known as:  ATARAX/VISTARIL  Take 25-50 mg by mouth every 6 (six) hours as needed for anxiety or itching (and at bedtime, if needed.).     insulin aspart 100 UNIT/ML injection  Commonly known as:  novoLOG  Inject 10-40 Units into the skin 3 (three) times daily before meals. 20 units with breakfast, 10 units with lunch and 40 units with the evening meal.     INSULIN SYRINGE .5CC/31GX5/16" 31G X 5/16" 0.5 ML Misc  Use one syringe 4 times daily. Dx code 250.01     Iron 240 (27 FE) MG Tabs  Take 1 tablet by mouth 2 (two) times daily.     isosorbide mononitrate 60 MG 24 hr tablet  Commonly known as:  IMDUR  Take 60 mg by mouth daily.     lactulose 10 GM/15ML solution  Commonly known as:  CHRONULAC  Take 30 mLs (20 g total) by mouth 2 (two) times daily.     loratadine 10 MG tablet  Commonly known as:  CLARITIN  Take 10 mg by mouth daily.     metoprolol tartrate 25 MG tablet  Commonly known as:   LOPRESSOR  Take 0.5 tablets (12.5 mg total) by mouth 2 (two) times daily.     nitroGLYCERIN 0.4 MG SL tablet  Commonly known as:  NITROSTAT  Place 1 tablet (0.4 mg total) under the tongue every 5 (five) minutes as needed for chest pain.     omeprazole 20 MG tablet  Commonly known as:  PRILOSEC OTC  Take 20 mg by mouth daily.     ondansetron 4 MG/2ML Soln injection  Commonly known as:  ZOFRAN  Inject 4 mg into the vein once.     penicillin v potassium 250 MG tablet  Commonly known as:  VEETID  Take 250 mg by mouth daily. Once daily     polyethylene glycol packet  Commonly known as:  MIRALAX / GLYCOLAX  Take 17 g by mouth daily as needed. Take two or three times a week     predniSONE 5 MG tablet  Commonly known as:  DELTASONE  Take 5 mg by mouth daily.     rifaximin 550 MG Tabs tablet  Commonly known as:  XIFAXAN  Take 550 mg by mouth 2 (two) times daily.     spironolactone 50 MG tablet  Commonly known as:  ALDACTONE  Take 1 tablet (50 mg total) by mouth daily.     vancomycin 50 mg/mL oral solution  Commonly known as:  VANCOCIN  Take 2.5 mLs (125 mg total) by mouth 4 (four) times daily.     vitamin B-1 250 MG tablet  Take 125 mg by mouth daily.     vitamin C 500 MG tablet  Commonly known as:  ASCORBIC ACID  Take 500 mg by mouth daily.           Follow-up Information   Follow up with Unice Cobble, MD In 1 week.   Specialty:  Internal Medicine   Contact information:   [address] [place] [address] [phone]       Follow up with CHISM, DAVID, MD In 1 week.   Specialty:  Internal Medicine   Contact information:   [place] [address] [phone]       The results of significant diagnostics from this hospitalization (including imaging, microbiology, ancillary and laboratory) are listed below for reference.    Significant Diagnostic Studies: Dg Chest Port 1  View  (if Code Sepsis Called)  04/29/2013   CLINICAL DATA:  Fever,  nausea and vomiting.  EXAM: PORTABLE CHEST - 1 VIEW  COMPARISON:  Chest x-ray 06/21/2012.  FINDINGS: Elevation of the left hemidiaphragm is unchanged. Lung volumes are normal. No consolidative airspace disease. No pleural effusions. Mild cephalization of the pulmonary vasculature, without frank pulmonary edema. Mild cardiomegaly. The patient is rotated to the right On today's exam, resulting in distortion of the mediastinal contours and reduced diagnostic sensitivity and specificity for mediastinal pathology. Atherosclerosis in the thoracic aorta. Status post median sternotomy for CABG.  IMPRESSION: 1. Cardiomegaly with pulmonary venous congestion, but no frank pulmonary edema. 2. Atherosclerosis.   Electronically Signed   By: Vinnie Langton M.D.   On: 04/29/2013 14:03   Ir Radiologist Eval & Mgmt  04/26/2013   : April 25, 2013  Concha Norway, M.D.  [facility] [employer].  [place], [place]  [id number]  RE:  [name] (DOB: 1935/09/12)  Dear Dr. Juliann Mule:  Thank you for the referral of your patient Teri Oneil for consultation regarding his newly diagnosed hepatocellular carcinoma. The patient is known to our service since February of 2010, when I saw him urgently for acute upper GI bleed secondary to portal venous hypertension which could not be controlled endoscopically, and necessitated placement of a TIPS. He has done well since the procedure, no further bleeding and mild hepatic encephalopathy controlled by lactulose.  As part of a recent diagnostic work-up for swallowing issues, multiple liver lesions were noted on a CT of 03/21/13. Follow-up MR demonstrated characteristic findings of multifocal hepatocellular carcinoma, corroborated with an elevated AFP tumor maker. He was reviewed at GI Tumor Board, found not to be a candidate for curative hepatic transplantation and referred for consideration of transarterial liver embolization.  His past medical history is significant for diverticulosis, coronary  artery disease, atrial fibrillation, aortic stenosis, hypertension, cirrhosis, alcoholism, thrombocytopenia, cellulitis, diabetes type 2 and chronic low back pain.  Past surgical history is significant for right knee arthroplasty, multiple lumbar surgeries and fusion, appendectomy, coronary bypass grafting, cataract extraction bilateral.  Current medications include acetaminophen, allopurinol, vitamin C, calcium-magnesium-zinc, fentanyl, iron, furosemide, gabapentin, glucose blood, hydroxyzine, insulin aspart, isosorbide mononitrate, lactulose, loratadine, metoprolol tartrate, nitroglycerin, omeprazole, penicillin v potassium, polyethylene glycol, prednisone, rifaximin, spironolactone, vitamin B1 and tizanidine.  He has allergy to shellfish, amoxicillin, aspirin, amoxicillin-pot clavulanate, buprenorphine Hcl-naloxone Hcl, codeine, ezetimibe, hydromorphone Hcl, meperidine Hcl, morphine, morphine sulfate, oxycodone and sulfonamide derivatives.  I spent the majority of the 45 minute consultation discussing the pathophysiology of hepatocellular carcinoma and treatment options. Unfortunately, he is not a transplant candidate which would be the only curative option. The multifocal nature of his disease precludes percutaneous ablation options.  He is an appropriate candidate for catheter directed therapies for palliation. We discussed transarterial chemoembolization, bland embolization and Y-90 radioembolization. We discussed the technique, anticipated benefits, possible risks and side effects and possible complications of the different approaches. In general, these have all been shown to have similar efficacy in the setting of hepatoma. However, considering his ECOG status I recommend consideration primarily of Y-90 radioembolization to start with. This avoids the systemic toxicity of transarterial chemoembolization. Concurrent p.o. or IV chemotherapy regimen could be pursued, if deemed appropriate. To complete his  work-up, we would need to do an arterial mapping study as well as assess shunt fraction to minimize nontarget radioembolization risk. He and his family seemed to understand and were anxious to proceed. Accordingly, we  can set up outpatient hepatic arteriography and shunt fraction calculation in contemplation of transarterial Y-90 radioembolization of his multifocal hepatocellular carcinoma. We discussed that this would likely have no effect on the TIPS shunt. The hypervascular nature of hepatoma is advantageous to minimize the radiation dose to his underlying liver parenchyma. I do not anticipate a significant effect on his diabetes which has been recently more difficult to control.  I appreciate your referral of [name] for consideration of transarterial embolization of his hepatocellular carcinoma and we will proceed with the mapping as above. I will coordinate with you the necessary surveillance imaging and testing post procedure.  Sincerely,  D. Arne Cleveland, M.D.  Jonathon ResidesSilvano Rusk, M.D.  Unice Cobble, M.D.  Renato Shin, M.D.   Electronically Signed   By: Arne Cleveland M.D.   On: 04/26/2013 09:07   Microbiology: Recent Results (from the past 240 hour(s))  CULTURE, BLOOD (ROUTINE X 2)     Status: None   Collection Time    04/29/13  1:05 PM      Result Value Ref Range Status   Specimen Description BLOOD RIGHT WRIST   Final   Special Requests BOTTLES DRAWN AEROBIC AND ANAEROBIC 5CC EACH   Final   Culture  Setup Time     Final   Value: 04/29/2013 20:55     Performed at [facility]   Culture     Final   Value: STAPHYLOCOCCUS SPECIES (COAGULASE NEGATIVE)     Note: THE SIGNIFICANCE OF ISOLATING THIS ORGANISM FROM A SINGLE SET OF BLOOD CULTURES WHEN MULTIPLE SETS ARE DRAWN IS UNCERTAIN. PLEASE NOTIFY THE MICROBIOLOGY DEPARTMENT WITHIN ONE WEEK IF SPECIATION AND SENSITIVITIES ARE REQUIRED.     Note: Gram Stain Report Called to,Read Back By and Verified With: IFEOMA O@12 :24PM ON  04/30/13 BY DANTS     Performed at [facility]   Report Status 05/01/2013 FINAL   Final  CULTURE, BLOOD (ROUTINE X 2)     Status: None   Collection Time    04/29/13  1:51 PM      Result Value Ref Range Status   Specimen Description BLOOD RIGHT HAND   Final   Special Requests BOTTLES DRAWN AEROBIC AND ANAEROBIC 5CC EACH   Final   Culture  Setup Time     Final   Value: 04/29/2013 20:55     Performed at [facility]   Culture     Final   Value:        BLOOD CULTURE RECEIVED NO GROWTH TO DATE CULTURE WILL BE HELD FOR 5 DAYS BEFORE ISSUING A FINAL NEGATIVE REPORT     Performed at [facility]   Report Status PENDING   Incomplete  URINE CULTURE     Status: None   Collection Time    04/29/13  2:05 PM      Result Value Ref Range Status   Specimen Description URINE, CATHETERIZED   Final   Special Requests Normal   Final   Culture  Setup Time     Final   Value: 04/29/2013 21:53     Performed at [facility]     Final   Value: NO GROWTH     Performed at [facility]   Culture     Final   Value: NO GROWTH     Performed at [facility]   Report Status 04/30/2013 FINAL   Final  CLOSTRIDIUM DIFFICILE BY  PCR     Status: Abnormal   Collection Time    05/02/13  2:29 PM      Result Value Ref Range Status   C difficile by pcr POSITIVE (*) NEGATIVE Final   Comment: CRITICAL RESULT CALLED TO, READ BACK BY AND VERIFIED WITH:     SASOM G RN 05/03/13 0751 COSTELLO B     Performed at [facility]: Basic Metabolic Panel:  Recent Labs Lab 04/30/13 0400 05/01/13 0347 05/02/13 0405 05/03/13 0400 05/04/13 0442  NA 137 138 141 142 140  K 3.9 3.9 3.7 3.4* 3.8  CL 100 100 102 99 100  CO2 27 29 28  33* 31  GLUCOSE 333* 245* 255* 169* 199*  BUN 27* 28* 22 18 18   CREATININE 1.22 1.36* 1.33 1.07 1.00  CALCIUM 8.5 8.5 8.5 8.6 9.0   Liver Function Tests:  Recent Labs Lab 04/29/13 1305 05/03/13 0400  AST 22 19  ALT  12 10  ALKPHOS 60 47  BILITOT 1.9* 0.7  PROT 6.0 5.5*  ALBUMIN 3.2* 2.8*    Recent Labs Lab 04/29/13 1305  LIPASE 13    Recent Labs Lab 04/30/13 0400  AMMONIA 43   CBC:  Recent Labs Lab 04/29/13 1305 04/30/13 0400 05/01/13 0347 05/02/13 0405 05/03/13 0400 05/04/13 0442  WBC 7.1 5.6 3.8* 3.2* 3.6* 4.6  NEUTROABS 6.0  --   --   --   --   --   HGB 11.8* 10.7* 10.1* 9.9* 10.3* 11.0*  HCT 34.4* 31.1* 31.2* 29.7* 31.3* 32.8*  MCV 95.6 95.4 97.2 97.1 96.9 95.9  PLT 41* 31* 34* 37* 47* 57*   BNP: BNP (last 3 results)  Recent Labs  05/25/12 1149  PROBNP 521.2*   CBG:  Recent Labs Lab 05/03/13 1159 05/03/13 1736 05/03/13 2213 05/04/13 0707 05/04/13 1159  GLUCAP 281* 171* 207* 156* 313*    Signed:  Monay Houlton  [facility] 05/05/2013, 2:55 PM

## 2013-05-06 ENCOUNTER — Encounter (HOSPITAL_COMMUNITY): Payer: Self-pay

## 2013-05-06 ENCOUNTER — Ambulatory Visit (HOSPITAL_COMMUNITY)
Admission: RE | Admit: 2013-05-06 | Discharge: 2013-05-06 | Disposition: A | Discharge status: Home / Self Care | Payer: Medicare Other | Source: Ambulatory Visit | Attending: Interventional Radiology | Admitting: Interventional Radiology

## 2013-05-06 ENCOUNTER — Other Ambulatory Visit: Payer: Self-pay | Admitting: Interventional Radiology

## 2013-05-06 DIAGNOSIS — Z794 Long term (current) use of insulin: Secondary | ICD-10-CM | POA: Insufficient documentation

## 2013-05-06 DIAGNOSIS — Z9849 Cataract extraction status, unspecified eye: Secondary | ICD-10-CM | POA: Insufficient documentation

## 2013-05-06 DIAGNOSIS — L03119 Cellulitis of unspecified part of limb: Secondary | ICD-10-CM

## 2013-05-06 DIAGNOSIS — C220 Liver cell carcinoma: Secondary | ICD-10-CM

## 2013-05-06 DIAGNOSIS — K31819 Angiodysplasia of stomach and duodenum without bleeding: Secondary | ICD-10-CM | POA: Insufficient documentation

## 2013-05-06 DIAGNOSIS — Z85820 Personal history of malignant melanoma of skin: Secondary | ICD-10-CM | POA: Insufficient documentation

## 2013-05-06 DIAGNOSIS — L02419 Cutaneous abscess of limb, unspecified: Secondary | ICD-10-CM | POA: Insufficient documentation

## 2013-05-06 DIAGNOSIS — E119 Type 2 diabetes mellitus without complications: Secondary | ICD-10-CM | POA: Insufficient documentation

## 2013-05-06 DIAGNOSIS — J309 Allergic rhinitis, unspecified: Secondary | ICD-10-CM | POA: Insufficient documentation

## 2013-05-06 DIAGNOSIS — D696 Thrombocytopenia, unspecified: Secondary | ICD-10-CM | POA: Insufficient documentation

## 2013-05-06 DIAGNOSIS — I5030 Unspecified diastolic (congestive) heart failure: Secondary | ICD-10-CM | POA: Insufficient documentation

## 2013-05-06 DIAGNOSIS — Z951 Presence of aortocoronary bypass graft: Secondary | ICD-10-CM | POA: Insufficient documentation

## 2013-05-06 DIAGNOSIS — Z882 Allergy status to sulfonamides status: Secondary | ICD-10-CM | POA: Insufficient documentation

## 2013-05-06 DIAGNOSIS — Z91013 Allergy to seafood: Secondary | ICD-10-CM | POA: Insufficient documentation

## 2013-05-06 DIAGNOSIS — C228 Malignant neoplasm of liver, primary, unspecified as to type: Secondary | ICD-10-CM | POA: Insufficient documentation

## 2013-05-06 DIAGNOSIS — I10 Essential (primary) hypertension: Secondary | ICD-10-CM | POA: Insufficient documentation

## 2013-05-06 DIAGNOSIS — Z881 Allergy status to other antibiotic agents status: Secondary | ICD-10-CM | POA: Insufficient documentation

## 2013-05-06 DIAGNOSIS — Z9089 Acquired absence of other organs: Secondary | ICD-10-CM | POA: Insufficient documentation

## 2013-05-06 DIAGNOSIS — G4733 Obstructive sleep apnea (adult) (pediatric): Secondary | ICD-10-CM | POA: Insufficient documentation

## 2013-05-06 DIAGNOSIS — Z885 Allergy status to narcotic agent status: Secondary | ICD-10-CM | POA: Insufficient documentation

## 2013-05-06 DIAGNOSIS — I359 Nonrheumatic aortic valve disorder, unspecified: Secondary | ICD-10-CM | POA: Insufficient documentation

## 2013-05-06 DIAGNOSIS — R0989 Other specified symptoms and signs involving the circulatory and respiratory systems: Secondary | ICD-10-CM | POA: Insufficient documentation

## 2013-05-06 DIAGNOSIS — I4891 Unspecified atrial fibrillation: Secondary | ICD-10-CM | POA: Insufficient documentation

## 2013-05-06 DIAGNOSIS — K219 Gastro-esophageal reflux disease without esophagitis: Secondary | ICD-10-CM | POA: Insufficient documentation

## 2013-05-06 DIAGNOSIS — A0472 Enterocolitis due to Clostridium difficile, not specified as recurrent: Secondary | ICD-10-CM | POA: Insufficient documentation

## 2013-05-06 DIAGNOSIS — I509 Heart failure, unspecified: Secondary | ICD-10-CM | POA: Insufficient documentation

## 2013-05-06 DIAGNOSIS — I2510 Atherosclerotic heart disease of native coronary artery without angina pectoris: Secondary | ICD-10-CM | POA: Insufficient documentation

## 2013-05-06 DIAGNOSIS — M109 Gout, unspecified: Secondary | ICD-10-CM | POA: Insufficient documentation

## 2013-05-06 DIAGNOSIS — K7460 Unspecified cirrhosis of liver: Secondary | ICD-10-CM | POA: Insufficient documentation

## 2013-05-06 DIAGNOSIS — Z886 Allergy status to analgesic agent status: Secondary | ICD-10-CM | POA: Insufficient documentation

## 2013-05-06 DIAGNOSIS — I872 Venous insufficiency (chronic) (peripheral): Secondary | ICD-10-CM | POA: Insufficient documentation

## 2013-05-06 LAB — COMPREHENSIVE METABOLIC PANEL
ALBUMIN: 3.1 g/dL — AB (ref 3.5–5.2)
ALK PHOS: 56 U/L (ref 39–117)
ALT: 11 U/L (ref 0–53)
AST: 20 U/L (ref 0–37)
BUN: 17 mg/dL (ref 6–23)
CO2: 31 mEq/L (ref 19–32)
Calcium: 9.1 mg/dL (ref 8.4–10.5)
Chloride: 97 mEq/L (ref 96–112)
Creatinine, Ser: 1.17 mg/dL (ref 0.50–1.35)
GFR calc Af Amer: 69 mL/min — ABNORMAL LOW (ref >90)
GFR calc non Af Amer: 59 mL/min — ABNORMAL LOW (ref >90)
GLUCOSE: 224 mg/dL — AB (ref 70–99)
POTASSIUM: 3.7 meq/L (ref 3.7–5.3)
SODIUM: 139 meq/L (ref 137–147)
TOTAL PROTEIN: 5.7 g/dL — AB (ref 6.0–8.3)
Total Bilirubin: 0.8 mg/dL (ref 0.3–1.2)

## 2013-05-06 LAB — CBC WITH DIFFERENTIAL/PLATELET
Basophils Absolute: 0 10*3/uL (ref 0.0–0.1)
Basophils Relative: 1 % (ref 0–1)
Eosinophils Absolute: 0 10*3/uL (ref 0.0–0.7)
Eosinophils Relative: 0 % (ref 0–5)
HEMATOCRIT: 33 % — AB (ref 39.0–52.0)
Hemoglobin: 11.3 g/dL — ABNORMAL LOW (ref 13.0–17.0)
LYMPHS PCT: 25 % (ref 12–46)
Lymphs Abs: 1.2 10*3/uL (ref 0.7–4.0)
MCH: 32.6 pg (ref 26.0–34.0)
MCHC: 34.2 g/dL (ref 30.0–36.0)
MCV: 95.1 fL (ref 78.0–100.0)
MONO ABS: 0.4 10*3/uL (ref 0.1–1.0)
Monocytes Relative: 8 % (ref 3–12)
Neutro Abs: 3.2 10*3/uL (ref 1.7–7.7)
Neutrophils Relative %: 66 % (ref 43–77)
Platelets: 58 10*3/uL — ABNORMAL LOW (ref 150–400)
RBC: 3.47 MIL/uL — AB (ref 4.22–5.81)
RDW: 14.3 % (ref 11.5–15.5)
WBC: 4.8 10*3/uL (ref 4.0–10.5)

## 2013-05-06 LAB — CULTURE, BLOOD (ROUTINE X 2): CULTURE: NO GROWTH

## 2013-05-06 LAB — PROTIME-INR
INR: 1.27 (ref 0.00–1.49)
PROTHROMBIN TIME: 15.6 s — AB (ref 11.6–15.2)

## 2013-05-06 LAB — APTT: aPTT: 33 seconds (ref 24–37)

## 2013-05-06 LAB — GLUCOSE, CAPILLARY: Glucose-Capillary: 198 mg/dL — ABNORMAL HIGH (ref 70–99)

## 2013-05-06 MED ORDER — SODIUM CHLORIDE 0.9 % IV SOLN
Freq: Once | INTRAVENOUS | Status: AC
Start: 2013-05-06 — End: 2013-05-06
  Administered 2013-05-06: 08:00:00 via INTRAVENOUS

## 2013-05-06 MED ORDER — LIDOCAINE HCL 1 % IJ SOLN
INTRAMUSCULAR | Status: AC
  Filled 2013-05-06: qty 20

## 2013-05-06 MED ORDER — TECHNETIUM TO 99M ALBUMIN AGGREGATED
3.3000 | Freq: Once | INTRAVENOUS | Status: AC | PRN
  Administered 2013-05-06: 3.3 via INTRAVENOUS

## 2013-05-06 MED ORDER — IOHEXOL 300 MG/ML  SOLN
80.0000 mL | Freq: Once | INTRAMUSCULAR | Status: AC | PRN
  Administered 2013-05-06: 107 mL via INTRA_ARTERIAL

## 2013-05-06 MED ORDER — MIDAZOLAM HCL 2 MG/2ML IJ SOLN
INTRAMUSCULAR | Status: AC | PRN
  Administered 2013-05-06 (×6): 0.5 mg via INTRAVENOUS

## 2013-05-06 MED ORDER — FENTANYL CITRATE 0.05 MG/ML IJ SOLN
INTRAMUSCULAR | Status: AC | PRN
Start: 2013-05-06 — End: 2013-05-06
  Administered 2013-05-06 (×4): 25 ug via INTRAVENOUS

## 2013-05-06 MED ORDER — MIDAZOLAM HCL 2 MG/2ML IJ SOLN
INTRAMUSCULAR | Status: AC
  Filled 2013-05-06: qty 6

## 2013-05-06 MED ORDER — HYDROCODONE-ACETAMINOPHEN 5-325 MG PO TABS: 1.0000 | ORAL_TABLET | ORAL | Status: DC | PRN

## 2013-05-06 MED ORDER — FENTANYL CITRATE 0.05 MG/ML IJ SOLN
INTRAMUSCULAR | Status: AC
  Filled 2013-05-06: qty 6

## 2013-05-06 NOTE — Procedures (Signed)
Hepatic arteriogram Gastroduod and R gastric artery coil embo MAA injection IA  No complication No blood loss. See complete dictation in [facility].

## 2013-05-06 NOTE — H&P (Signed)
HPI: [name] is an 78 y.o. male with history of liver cirrhosis who has had prior TIPS. He is also now found to have right lobe multifocal hepatoma, likely HCC.  He was seen in clinic by Dr. Vernard Gambles in discussion for TACE or Y-90 radioembolization. He was determined a good candidate, and is scheduled for pre-Y90 procedure today He was recently admitted for cellulitis of the right leg and C.Diff, both of which are much improved as he is taking his antibiotic regimen as directed. He denies fevers, chills Chart, PMHx, labs, and current meds reviewed.   Past Medical History:  Past Medical History   Diagnosis  Date   .  Diverticulosis    .  Gastric antral vascular ectasia    .  Rhinophyma    .  Melanoma      resected by [facility] 2008   .  CAD (coronary artery disease)      myoview 2007 no ischemia/ no ASA because of GI disease   .  Atrial fibrillation      amiodarone in past, but problems and left off meds.   .  Aortic stenosis      echo, September, 2010, mild aortic insufficiency   .  Visual loss, one eye      right eye-ophthalmic arterial branch occlusion   .  Internal hemorrhoids    .  GERD (gastroesophageal reflux disease)    .  Hypertension    .  Allergic rhinitis    .  Anemia      multifactorial   .  Cirrhosis, alcoholic    .  Thrombocytopenia    .  Bleeding esophageal varices    .  Encephalopathy, unspecified    .  Gout    .  Short-segment Barrett's esophagus      suspected   .  Venous insufficiency    .  Ejection fraction      EF 55%, echo, September, 2010   .  Carotid bruit      Dopplers okay in the past   .  Skin cancer      Basal cell and squamous cell   .  Cellulitis      recurrent to RLE/notes 06/21/2012   .  CHF (congestive heart failure)    .  Sinus bradycardia      January, 2014   .  Diastolic CHF      January, 2014   .  Complication of anesthesia      "quit breathing once a long time ago; dropped my BP too; it was after I was given Dilaudid" (06/21/2012)   .   Heart murmur    .  Anginal pain    .  Shortness of breath      "stay that way most of the time now" (06/21/2012)   .  OSA on CPAP    .  Diabetes mellitus, type 2    .  History of blood transfusion      "not related to OR" (06/21/2012)   .  H/O hiatal hernia    .  Osteoarthritis      "ALL OVER" (06/21/2012)   .  Chronic lower back pain    .  Aspiration into airway-silent on modified barium swallow  03/20/2013    Past Surgical History:  Past Surgical History   Procedure  Laterality  Date   .  Total knee arthroplasty  Right    .  Esophagogastroduodenoscopy   04/10/2008  esophageal varices, cardia varix   .  Tips procedure   2/10   .  Back surgery       X5 "for ruptured disc; last time did a fusion" (06/21/2012)   .  Excisional hemorrhoidectomy     .  Release scar contracture / graft repairs of hand       bilaterally   .  Elbow surgery  Right    .  Hiatal hernia repair     .  Colonoscopy w/ polypectomy   02/14/2007     adenomatous polyps, diverticulosis, internal hemorrhoids/rectal varices   .  Cataract extraction, bilateral   January 2013   .  Tonsillectomy     .  Appendectomy     .  Joint replacement     .  Coronary artery bypass graft   1993     6 Bypass   .  Cataract extraction w/ intraocular lens implant, bilateral      Family History:  Family History   Problem  Relation  Age of Onset   .  Alzheimer's disease  Mother    .  Cancer  Father      GI (stomach?)   .  Heart disease  Sister      2 sisters   .  Liver disease  Sister    .  Diabetes  Brother    .  Heart disease  Brother      3 brothers   .  Liver disease  Brother    .  Heart attack  Other      sibling   .  Melanoma  Other      sibling   .  Stroke  Other      sibling   .  Colon cancer  Neg Hx     Social History: reports that he quit smoking about 38 years ago. His smoking use included Cigarettes. He has a 40 pack-year smoking history. He has never used smokeless tobacco. He reports that he does not drink  alcohol or use illicit drugs.   Allergies:  Allergies   Allergen  Reactions   .  Shellfish-Derived Products      Rash & angioedema   .  Amoxicillin-Pot Clavulanate      Unsure what reaction was but pt tolerated penicillin 05/25/2012   .  Aspirin      REACTION: unable to tolerate due to cirrhosis and varices   .  Buprenorphine Hcl-Naloxone Hcl  Other (See Comments)     unresponsive   .  Codeine      Rash   .  Ezetimibe      REACTION: LEG/JOINT PAIN   .  Hydromorphone Hcl      unknown   .  Meperidine Hcl      unknown   .  Morphine      unknown   .  Morphine Sulfate      REACTION: unspecified   .  Oxycodone      hallucinations   .  Sulfonamide Derivatives     Medications:  Current facility-administered medications:acetaminophen (TYLENOL) tablet 650 mg, 650 mg, Oral, Q8H PRN, Caren Griffins, MD, 650 mg at 05/03/13 1136; allopurinol (ZYLOPRIM) tablet 150 mg, 150 mg, Oral, Daily, Theodis Blaze, MD, 150 mg at 05/03/13 0914; doxycycline (VIBRA-TABS) tablet 100 mg, 100 mg, Oral, Q12H, Caren Griffins, MD, 100 mg at 05/03/13 1121  feeding supplement (GLUCERNA SHAKE) (GLUCERNA SHAKE) liquid 237 mL, 237 mL, Oral, BID BM,  Hazle Coca, RD, 237 mL at 05/03/13 0914; fentaNYL ([place] - dosed mcg/hr) 100 mcg, 100 mcg, Transdermal, Q72H, Theodis Blaze, MD, 100 mcg at 05/02/13 2143; furosemide (LASIX) tablet 60 mg, 60 mg, Oral, BID, Kinnie Feil, MD, 60 mg at 05/03/13 0844  gabapentin (NEURONTIN) capsule 300 mg, 300 mg, Oral, TID, Theodis Blaze, MD, 300 mg at 05/03/13 0914; hydrOXYzine (ATARAX/VISTARIL) tablet 25-50 mg, 25-50 mg, Oral, Q6H PRN, Theodis Blaze, MD; insulin aspart (novoLOG) injection 0-20 Units, 0-20 Units, Subcutaneous, TID WC, Costin Karlyne Greenspan, MD, 11 Units at 05/03/13 1253; insulin aspart (novoLOG) injection 0-5 Units, 0-5 Units, Subcutaneous, QHS, Caren Griffins, MD, 3 Units at 05/02/13 2148  insulin aspart (novoLOG) injection 10 Units, 10 Units, Subcutaneous, Q  breakfast, Theodis Blaze, MD, 10 Units at 05/03/13 [phone]; insulin aspart (novoLOG) injection 20 Units, 20 Units, Subcutaneous, Q supper, Theodis Blaze, MD, 20 Units at 05/02/13 1742; insulin aspart (novoLOG) injection 5 Units, 5 Units, Subcutaneous, Q lunch, Theodis Blaze, MD, 5 Units at 05/03/13 1254  insulin glargine (LANTUS) injection 25 Units, 25 Units, Subcutaneous, QHS, Theodis Blaze, MD, 25 Units at 05/02/13 2137; isosorbide mononitrate (IMDUR) 24 hr tablet 60 mg, 60 mg, Oral, Daily, Theodis Blaze, MD, 60 mg at 05/03/13 0915; lactulose (CHRONULAC) 10 GM/15ML solution 20 g, 20 g, Oral, BID, Theodis Blaze, MD, 20 g at 05/03/13 0914; loratadine (CLARITIN) tablet 10 mg, 10 mg, Oral, Daily, Theodis Blaze, MD, 10 mg at 05/03/13 0915  metoprolol tartrate (LOPRESSOR) tablet 12.5 mg, 12.5 mg, Oral, BID, Theodis Blaze, MD, 12.5 mg at 05/03/13 0915; nitroGLYCERIN (NITROSTAT) SL tablet 0.4 mg, 0.4 mg, Sublingual, Q5 min PRN, Theodis Blaze, MD; omeprazole (PRILOSEC) capsule 20 mg, 20 mg, Oral, Daily, Theodis Blaze, MD, 20 mg at 05/03/13 0915; ondansetron (ZOFRAN) injection 4 mg, 4 mg, Intravenous, Q6H PRN, Theodis Blaze, MD  ondansetron [facility]) tablet 4 mg, 4 mg, Oral, Q6H PRN, Theodis Blaze, MD; predniSONE (DELTASONE) tablet 5 mg, 5 mg, Oral, Q breakfast, Theodis Blaze, MD, 5 mg at 05/03/13 [id number]; spironolactone (ALDACTONE) tablet 50 mg, 50 mg, Oral, Daily, Theodis Blaze, MD, 50 mg at 05/03/13 0914; vancomycin (VANCOCIN) 50 mg/mL oral solution 125 mg, 125 mg, Oral, QID, Caren Griffins, MD, 125 mg at 05/03/13 1121  vitamin C (ASCORBIC ACID) tablet 500 mg, 500 mg, Oral, Daily, Theodis Blaze, MD, 500 mg at 05/03/13 [id number]  Please HPI for pertinent positives, otherwise complete 10 system ROS negative.    Physical Exam:  BP 121/56  Pulse 69  Temp(Src) 98.6 F (37 C) (Oral)  Resp 16  Ht 6\' 3"  (1.905 m)  Wt 243 lb 8 oz (110.451 kg)  BMI 30.44 kg/m2  SpO2 100% Body mass index is 30.44 kg/(m^2).   General  Appearance:  Alert, cooperative, no distress, appears stated age   Head:  Normocephalic, without obvious abnormality, atraumatic   ENT:  Unremarkable   Neck:  Supple, symmetrical, trachea midline   Lungs:  Clear to auscultation bilaterally, no w/r/r,   Heart:  Regular rate and rhythm, S1, S2 normal, no murmur, rub or gallop.   Abdomen:  Soft, non-tender, non distended.   Extremities:  (R)LE with improving cellulitis, groin not involved.   Pulses:  2+ and symmetric femoral   Neurologic:  Normal affect, no gross deficits.    Labs: CBC    Component Value Date/Time   WBC 4.8 05/06/2013 0804   RBC 3.47*  05/06/2013 0804   HGB 11.3* 05/06/2013 0804   HCT 33.0* 05/06/2013 0804   PLT 58* 05/06/2013 0804   MCV 95.1 05/06/2013 0804   MCH 32.6 05/06/2013 0804   MCHC 34.2 05/06/2013 0804   RDW 14.3 05/06/2013 0804   LYMPHSABS 1.2 05/06/2013 0804   MONOABS 0.4 05/06/2013 0804   EOSABS 0.0 05/06/2013 0804   BASOSABS 0.0 05/06/2013 0804    BMET    Component Value Date/Time   NA 139 05/06/2013 0804   K 3.7 05/06/2013 0804   CL 97 05/06/2013 0804   CO2 31 05/06/2013 0804   GLUCOSE 224* 05/06/2013 0804   BUN 17 05/06/2013 0804   CREATININE 1.17 05/06/2013 0804   CREATININE 1.28 01/14/2013 1553   CALCIUM 9.1 05/06/2013 0804   GFRNONAA 59* 05/06/2013 0804   GFRAA 8* 05/06/2013 0804    Prothrombin Time/INR 15.6/1.27  Assessment/Plan  Cirrhosis, s/p hx of TIPS  Multifocal right lobe mass highly suspicious for [facility] for visceral arteriogram with possible coil embolization and test dose of Y-90 radioembolization treatment on Mon 3/16.  Procedure discussed with pt and wife. Risks, complications, use of sedation. Consent signed in chart Thrombocytopenia, likely chronic from liver disease. Recent trend up (58) but will need PLTs prior to procedure, preferably as close as possible to procedure.   Ascencion Dike PA-C Interventional Radiology 05/06/2013 9:17 AM

## 2013-05-06 NOTE — Discharge Instructions (Signed)
Moderate Sedation, Adult Moderate sedation is given to help you relax or even sleep through a procedure. You may remain sleepy, be clumsy, or have poor balance for several hours following this procedure. Arrange for a responsible adult, family member, or friend to take you home. A responsible adult should stay with you for at least 24 hours or until the medicines have worn off.  Do not participate in any activities where you could become injured for the next 24 hours, or until you feel normal again. Do not:  Drive.  Swim.  Ride a bicycle.  Operate heavy machinery.  Cook.  Use power tools.  Climb ladders.  Work at [employer].  Do not make important decisions or sign legal documents until you are improved.  Vomiting may occur if you eat too soon. When you can drink without vomiting, try water, juice, or soup. Try solid foods if you feel little or no nausea.  Only take over-the-counter or prescription medications for pain, discomfort, or fever as directed by your caregiver.If pain medications have been prescribed for you, ask your caregiver how soon it is safe to take them.  Make sure you and your family fully understands everything about the medication given to you. Make sure you understand what side effects may occur.  You should not drink alcohol, take sleeping pills, or medications that cause drowsiness for at least 24 hours.  If you smoke, do not smoke alone.  If you are feeling better, you may resume normal activities 24 hours after receiving sedation.  Keep all appointments as scheduled. Follow all instructions.  Ask questions if you do not understand. SEEK MEDICAL CARE IF:   Your skin is pale or bluish in color.  You continue to feel sick to your stomach (nauseous) or throw up (vomit).  Your pain is getting worse and not helped by medication.  You have bleeding or swelling.  You are still sleepy or feeling clumsy after 24 hours. SEEK IMMEDIATE MEDICAL CARE IF:    You develop a rash.  You have difficulty breathing.  You develop any type of allergic problem.  You have a fever. Document Released: 11/02/2000 Document Revised: 05/02/2011 Document Reviewed: 10/15/2012 [facility] Patient Information 2014 [place]. Post Y-90 Radioembolization Discharge Instructions  You have been given a radioactive material during your procedure.  While it is safe for you to be discharged home from the hospital, you need to proceed directly home.    Do not use public transportation, including air travel, lasting more than 2 hours for 1 week.  Avoid crowded public places for 1 week.  Adult visitors should try to avoid close contact with you for 1 week.    Children and pregnant females should not visit or have close contact with you for 1 week.  Items that you touch are not radioactive.  Do not sleep in the same bed as your partner for 1 week, and a condom should be used for sexual activity during the first 24 hours.  Your blood may be radioactive and caution should be used if any bleeding occurs during the recovery period.  Body fluids may be radioactive for 24 hours.  Wash your hands after voiding.  Men should sit to urinate.  Dispose of any soiled materials (flush down toilet or place in trash at home) during the first day.  Drink 6 to 8 glasses of fluids per day for 5 days to hydrate yourself.  If you need to see a doctor during the first week, you  must let them know that you were treated with yttrium-90 microspheres, and will be slightly radioactive.  They can call Interventional Radiology [phone] with any questions.

## 2013-05-06 NOTE — H&P (Signed)
T Bili down to 0.8. Ok to proceed.

## 2013-05-06 NOTE — Sedation Documentation (Signed)
Post imaging completed in Nuc Med.  Pt transported to Short Stay for recovery on bed.

## 2013-05-06 NOTE — Sedation Documentation (Signed)
5Fr sheath removed from R femoral artery by Dr. Vernard Gambles.  Hemostasis achieved using Exoseal device. R groin level 0. 2+RDP.  Manual pressure applied X 5 mins., gauze/tegaderm dressing applied, CDI.

## 2013-05-07 ENCOUNTER — Other Ambulatory Visit: Payer: Self-pay | Admitting: Interventional Radiology

## 2013-05-07 DIAGNOSIS — C220 Liver cell carcinoma: Secondary | ICD-10-CM

## 2013-05-07 LAB — PREPARE PLATELET PHERESIS
UNIT DIVISION: 0
Unit division: 0
Unit division: 0

## 2013-05-08 ENCOUNTER — Telehealth: Payer: Self-pay | Admitting: Internal Medicine

## 2013-05-08 ENCOUNTER — Other Ambulatory Visit (HOSPITAL_BASED_OUTPATIENT_CLINIC_OR_DEPARTMENT_OTHER): Payer: Medicare Other

## 2013-05-08 ENCOUNTER — Ambulatory Visit (HOSPITAL_BASED_OUTPATIENT_CLINIC_OR_DEPARTMENT_OTHER): Payer: Medicare Other | Admitting: Internal Medicine

## 2013-05-08 VITALS — BP 153/52 | HR 60 | Temp 98.1°F | Resp 18 | Ht 75.0 in | Wt 248.4 lb

## 2013-05-08 DIAGNOSIS — C220 Liver cell carcinoma: Secondary | ICD-10-CM

## 2013-05-08 DIAGNOSIS — K7290 Hepatic failure, unspecified without coma: Secondary | ICD-10-CM

## 2013-05-08 DIAGNOSIS — A0472 Enterocolitis due to Clostridium difficile, not specified as recurrent: Secondary | ICD-10-CM

## 2013-05-08 DIAGNOSIS — D649 Anemia, unspecified: Secondary | ICD-10-CM

## 2013-05-08 DIAGNOSIS — L03119 Cellulitis of unspecified part of limb: Secondary | ICD-10-CM

## 2013-05-08 DIAGNOSIS — D696 Thrombocytopenia, unspecified: Secondary | ICD-10-CM

## 2013-05-08 DIAGNOSIS — C228 Malignant neoplasm of liver, primary, unspecified as to type: Secondary | ICD-10-CM

## 2013-05-08 DIAGNOSIS — L03115 Cellulitis of right lower limb: Secondary | ICD-10-CM

## 2013-05-08 DIAGNOSIS — L02419 Cutaneous abscess of limb, unspecified: Secondary | ICD-10-CM

## 2013-05-08 DIAGNOSIS — K7682 Hepatic encephalopathy: Secondary | ICD-10-CM

## 2013-05-08 LAB — CBC WITH DIFFERENTIAL/PLATELET
BASO%: 0.7 % (ref 0.0–2.0)
Basophils Absolute: 0 10*3/uL (ref 0.0–0.1)
EOS ABS: 0 10*3/uL (ref 0.0–0.5)
EOS%: 0 % (ref 0.0–7.0)
HCT: 32.7 % — ABNORMAL LOW (ref 38.4–49.9)
HGB: 11 g/dL — ABNORMAL LOW (ref 13.0–17.1)
LYMPH%: 18.4 % (ref 14.0–49.0)
MCH: 32.9 pg (ref 27.2–33.4)
MCHC: 33.6 g/dL (ref 32.0–36.0)
MCV: 98 fL (ref 79.3–98.0)
MONO#: 0.4 10*3/uL (ref 0.1–0.9)
MONO%: 9.5 % (ref 0.0–14.0)
NEUT%: 71.4 % (ref 39.0–75.0)
NEUTROS ABS: 3.3 10*3/uL (ref 1.5–6.5)
Platelets: 56 10*3/uL — ABNORMAL LOW (ref 140–400)
RBC: 3.33 10*6/uL — AB (ref 4.20–5.82)
RDW: 14.6 % (ref 11.0–14.6)
WBC: 4.6 10*3/uL (ref 4.0–10.3)
lymph#: 0.8 10*3/uL — ABNORMAL LOW (ref 0.9–3.3)

## 2013-05-08 LAB — COMPREHENSIVE METABOLIC PANEL (CC13)
ALK PHOS: 58 U/L (ref 40–150)
ALT: 12 U/L (ref 0–55)
AST: 18 U/L (ref 5–34)
Albumin: 3.1 g/dL — ABNORMAL LOW (ref 3.5–5.0)
Anion Gap: 10 mEq/L (ref 3–11)
BUN: 15.6 mg/dL (ref 7.0–26.0)
CO2: 29 mEq/L (ref 22–29)
Calcium: 9.7 mg/dL (ref 8.4–10.4)
Chloride: 101 mEq/L (ref 98–109)
Creatinine: 1.2 mg/dL (ref 0.7–1.3)
Glucose: 248 mg/dl — ABNORMAL HIGH (ref 70–140)
Potassium: 3.8 mEq/L (ref 3.5–5.1)
SODIUM: 140 meq/L (ref 136–145)
Total Bilirubin: 1.28 mg/dL — ABNORMAL HIGH (ref 0.20–1.20)
Total Protein: 6.1 g/dL — ABNORMAL LOW (ref 6.4–8.3)

## 2013-05-08 MED ORDER — ONDANSETRON HCL 8 MG PO TABS: 8.0000 mg | ORAL_TABLET | Freq: Two times a day (BID) | ORAL | Status: DC | PRN

## 2013-05-08 NOTE — Telephone Encounter (Signed)
cld home # spoke w/pt wife adv of pt appt of 3/26 & 4/15

## 2013-05-10 ENCOUNTER — Ambulatory Visit (INDEPENDENT_AMBULATORY_CARE_PROVIDER_SITE_OTHER): Payer: Medicare Other | Admitting: Internal Medicine

## 2013-05-10 ENCOUNTER — Encounter: Payer: Self-pay | Admitting: Internal Medicine

## 2013-05-10 VITALS — BP 130/58 | HR 59 | Temp 98.3°F | Resp 15 | Ht 72.25 in | Wt 253.8 lb

## 2013-05-10 DIAGNOSIS — A0472 Enterocolitis due to Clostridium difficile, not specified as recurrent: Secondary | ICD-10-CM

## 2013-05-10 DIAGNOSIS — I2510 Atherosclerotic heart disease of native coronary artery without angina pectoris: Secondary | ICD-10-CM

## 2013-05-10 NOTE — Progress Notes (Signed)
Pre visit review using our clinic review tool, if applicable. No additional management support is needed unless otherwise documented below in the visit note. 

## 2013-05-10 NOTE — Patient Instructions (Signed)
The GI  referral will be scheduled and you'll be notified of the time. 

## 2013-05-10 NOTE — Progress Notes (Signed)
   Subjective:    Patient ID: [name], male    DOB: 03-03-1935, 78 y.o.   MRN: [id number]  HPI  His hospital records 3/9-3/15/15 were reviewed. On 3/12 C. difficile was positive. He is on a compounded vancomycin preparation for a total of 2 weeks  He's also on doxycycline for cellulitis which he will complete tomorrow .  His diarrhea remains totally uncontrolled with intermittent stool incontinence.    Review of Systems  He continues to have nausea and anorexia; abdominal pain is not a major symptom  His stool is black ; he is on iron twice a day but it appears darker than usual with iron supplementation.  His CBC 3/16 and 3/18 revealed stable anemia with a hematocrit of 32.7. Platelet count remains low at 56,000. Chemistries reviewed; they are also stable 3/16-3/18  The cellulitis is subjectively improving with the doxycycline.       Objective:   Physical Exam General appearance :adequate nourishment w/o distress. Appears chronically ill  Eyes: No conjunctival inflammation or scleral icterus is present.  Oral exam: Tongue moist  Heart:  Normal rate and regular rhythm. S1 and S2 normal without gallop,  click, rub or other extra sounds  .Grade 2.5-6/3 systolic murmur   Lungs:Chest clear to auscultation; no wheezes, rhonchi,rales ,or rubs present.No increased work of breathing.   Abdomen:  Protuberant.bowel sounds normal, soft and non-tender without masses, organomegaly or hernias noted.  No guarding or rebound . No tenderness over the flanks to percussion  Musculoskeletal: using cane; deliberate , slow gaitl  Skin:Warm & dry.  Intact without suspicious lesions or rashes ; no jaundice or tenting. Scattered bruising  Lymphatic: No lymphadenopathy is noted about the head, neck, axilla  Tense edema of lower extremities despite support garments  Affect flat; wife is historian               Assessment & Plan:  #1 C difficle colitis, suboptimal response to  Vancomycin to date #2 [facility] Plan : consult Dr Carlean Purl

## 2013-05-12 NOTE — Progress Notes (Signed)
[place] OFFICE PROGRESS NOTE  Unice Cobble, MD [phone] [place] [address]  DIAGNOSIS: [place] (hepatocellular carcinoma) - Plan: CBC with Differential, Basic metabolic panel (Bmet) - [facility], CBC with Differential, Comprehensive metabolic panel (Cmet) - [facility], ondansetron (ZOFRAN) 8 MG tablet  Cellulitis of right lower extremity  Hepatic encephalopathy  Chief Complaint  Patient presents with  . Hepatocellular carcinoma    CURRENT TREATMENT:  Y-90 radioembolization by Dr. Arne Cleveland.  INTERVAL HISTORY: Jakeel R Wallner 78 y.o. male with a history of cirrhosis complicated by encephalopathy s/p TIPS is here for follow-up.   He was last seen by me on 04/23/2013.    He underwent Y-90 radioembolization by Dr. Vernard Gambles on 0000000 without complications. He also was discharged on Sunday (04/29/2013- 05/05/2013) for acute on chronic cellulitis of the right leg and C. Difficile colitis with fevers. He responded with broad spectrum antibiotics (vancomycin and zosyn) which was narrowed on discharge to doxycycline.  R Lower extremity duplex was negative for DVT. He was discharged with oral vancomycin to finish a 14-day course for his C. Difficile.   He had labs collected on 04/05/2013 demonstrating an AFP-tumor marker of 44.8 up from 2.5 nearly one year ago. His CBC on 04/17/2013 revealed a WBC of 4.3; hemoglobin of 11.6; HCT of 34.5 and platelets of 48. His INR was 1.36. He had a MR of the liver with and without contrast on 04/03/2013 which showed cirrhosis with multiple anterior segment right liver lobe lesions, most consistent with multifocal HCC. There was also portal venous hypertension with chronic non-occulusive wall thrombus in the splenic venin and splenoportal confluence. His TIPS shunt catheter was patent.   He was referred for US biopsy of liver lesion by Dr. Laurence Ferrari of Interventional radiology and based on MRI he has a total of 4 (> 1cm) lesions that all demonstrate  arterial enhancement and delayed washout which is diagnostic of multifocal HCC by both AASLD and UNOS/OPTN criteria. A biopsy was not necessary. He was also seen by Dr. Loanne Drilling on 04/01/2013 for management of his insulin-requrining diabetes. He was was seen by Dr. Carlean Purl of Gastroenterology on 03/20/2013. Dr. Godfrey Pick had dilated his GE junction stenosis stricure area up to 18 mm in December, 2014 with the patient reporting improvement in his swallowing.   Today, he is accompanied by his wife Arleen. He denies any recent bleeding episodes. He reports too much diarrhea still and he associates it with his bid use of lactulose and his C. Difficile infection.  Wife is concerned that if he stops the lactulose that he will become confused.  He follows [facility] for his extensive cardiac history. He ambulates with the assistance of a cane.    MEDICAL HISTORY: Past Medical History  Diagnosis Date  . Diverticulosis   . Gastric antral vascular ectasia   . Rhinophyma   . Melanoma     resected by [facility] 2008  . CAD (coronary artery disease)     myoview 2007 no ischemia/ no ASA because of GI disease  . Atrial fibrillation     amiodarone in past, but problems and left off meds.  . Aortic stenosis     echo, September, 2010, mild aortic insufficiency  . Visual loss, one eye     right eye-ophthalmic arterial branch occlusion  . Internal hemorrhoids   . GERD (gastroesophageal reflux disease)   . Hypertension   . Allergic rhinitis   . Anemia     multifactorial  . Cirrhosis, alcoholic   .  Thrombocytopenia   . Bleeding esophageal varices   . Encephalopathy, unspecified   . Gout   . Short-segment Barrett's esophagus     suspected  . Venous insufficiency   . Ejection fraction     EF 55%, echo, September, 2010  . Carotid bruit     Dopplers okay in the past  . Skin cancer     Basal cell and squamous cell  . Cellulitis     recurrent to [facility]/notes 06/21/2012  . CHF (congestive heart failure)   .  Sinus bradycardia     January, 2014  . Diastolic CHF     January, 2014  . Complication of anesthesia     "quit breathing once a long time ago; dropped my BP too; it was after I was given Dilaudid" (06/21/2012)  . Heart murmur   . Anginal pain   . Shortness of breath     "stay that way most of the time now" (06/21/2012)  . OSA on CPAP   . Diabetes mellitus, type 2   . History of blood transfusion     "not related to OR" (06/21/2012)  . H/O hiatal hernia   . Osteoarthritis     "ALL OVER" (06/21/2012)  . Chronic lower back pain   . Aspiration into airway-silent on modified barium swallow 03/20/2013    INTERIM HISTORY: has HYPERLIPIDEMIA; GOUT NOS; ANEMIA, IRON DEFICIENCY; THROMBOCYTOPENIA, CHRONIC; ARTERIAL BRANCH OCCLUSION OF RETINA; HYPERTENSION, ESSENTIAL NOS; ALLERGIC RHINITIS; GERD; CIRRHOSIS, ALCOHOLIC; Hepatic encephalopathy; HYPERPLASIA, PRST NOS W/O URINARY OBST/LUTS; OSTEOARTHRITIS; LOW BACK PAIN, CHRONIC; SLEEP APNEA;  ASCITES; HYPERURICEMIA; COLONIC POLYPS, ADENOMATOUS, HX OF; CORONARY ARTERY BYPASS GRAFT, HX OF; NEUROPATHY; Paresthesia of foot; Ejection fraction; Carotid bruit; CAD (coronary artery disease); Atrial fibrillation; Unspecified adverse effect of unspecified drug, medicinal and biological substance; DM2 (diabetes mellitus, type 2); Edema; Sinus bradycardia; Aortic stenosis; Diastolic CHF; Congestive heart failure; Cellulitis of right lower extremity; DM (diabetes mellitus) with peripheral vascular complication; Lymphedema of lower extremity; Venous stasis of lower extremity; Open wound of knee, leg (except thigh), and ankle, complicated; Dysphagia, [id number]); Left arm pain; Aspiration into airway-silent on modified barium swallow; Leg ulcer, left; HCC (hepatocellular carcinoma); Malnutrition of moderate degree; and Enteritis due to Clostridium difficile on his problem list.    ALLERGIES:  is allergic to shellfish-derived products; amoxicillin-pot clavulanate; aspirin;  buprenorphine hcl-naloxone hcl; codeine; ezetimibe; hydromorphone hcl; meperidine hcl; morphine; morphine sulfate; oxycodone; and sulfonamide derivatives.  MEDICATIONS: has a current medication list which includes the following prescription(s): acetaminophen, acetaminophen, allopurinol, vitamin c, calcium-magnesium-zinc, doxycycline, fentanyl, iron, furosemide, gabapentin, glucose blood, insulin aspart, insulin syringe .5cc/31gx5/16", isosorbide mononitrate, loratadine, metoprolol tartrate, omeprazole, prednisone, spironolactone, vitamin b-1, vancomycin, lactulose, nitroglycerin, ondansetron, and polyethylene glycol.  SURGICAL HISTORY:  Past Surgical History  Procedure Laterality Date  . Total knee arthroplasty Right   . Esophagogastroduodenoscopy  04/10/2008    esophageal varices, cardia varix  . Tips procedure  2/10  . Back surgery      X5 "for ruptured disc; last time did a fusion" (06/21/2012)  . Excisional hemorrhoidectomy    . Release scar contracture / graft repairs of hand      bilaterally   . Elbow surgery Right   . Hiatal hernia repair    . Colonoscopy w/ polypectomy  02/14/2007    adenomatous polyps, diverticulosis, internal hemorrhoids/rectal varices  . Cataract extraction, bilateral  January 2013  . Tonsillectomy    . Appendectomy    . Joint replacement    . Coronary artery bypass graft  1993    6 Bypass  . Cataract extraction w/ intraocular lens  implant, bilateral      REVIEW OF SYSTEMS:   Constitutional: Denies fevers, chills or abnormal weight loss Eyes: Denies blurriness of vision Ears, nose, mouth, throat, and face: Denies mucositis or sore throat Respiratory: Denies cough, dyspnea or wheezes Cardiovascular: Denies palpitation, chest discomfort or lower extremity swelling Gastrointestinal:  Denies nausea, heartburn or change in bowel habits Skin: Denies abnormal skin rashes Lymphatics: Denies new lymphadenopathy or easy bruising Neurological:Denies numbness,  tingling or new weaknesses Behavioral/Psych: Mood is stable, no new changes  All other systems were reviewed with the patient and are negative.  PHYSICAL EXAMINATION: ECOG PERFORMANCE STATUS: 2 - Symptomatic, <50% confined to bed  Blood pressure 153/52, pulse 60, temperature 98.1 F (36.7 C), temperature source Oral, resp. rate 18, height 6\' 3"  (1.905 m), weight 248 lb 6.4 oz (112.674 kg).  GENERAL:alert, no distress and comfortable; Ambulates to the bench cautiously, independently  SKIN: skin color, texture, turgor are normal, no rashes or significant lesions; + gynecomastia ; R leg with reddness to mid calf level (improved since evaluated in the hospital) EYES: normal, Conjunctiva are pink and non-injected, sclera clear  OROPHARYNX:no exudate, no erythema and lips, buccal mucosa, and tongue normal  NECK: supple, thyroid normal size, non-tender, without nodularity  LYMPH: no palpable lymphadenopathy in the cervical, axillary or inguinal  LUNGS: clear to auscultation and percussion with normal breathing effort  HEART: regular rate & rhythm and no murmurs and 1 + extremity edema  ABDOMEN:abdomen soft, non-tender and normal bowel sounds; distended with mild fluid; No additional stigmata of liver disease, i.e., caput medusa  Musculoskeletal:no cyanosis of digits and no clubbing  NEURO: alert & oriented x 3 with fluent speech, no focal motor/sensory deficits; No asterix    Labs:  Lab Results  Component Value Date   WBC 4.6 05/08/2013   HGB 11.0* 05/08/2013   HCT 32.7* 05/08/2013   MCV 98.0 05/08/2013   PLT 56 few Large platelets present* 05/08/2013   NEUTROABS 3.3 05/08/2013      Chemistry      Component Value Date/Time   NA 140 05/08/2013 1005   NA 139 05/06/2013 0804   K 3.8 05/08/2013 1005   K 3.7 05/06/2013 0804   CL 97 05/06/2013 0804   CO2 29 05/08/2013 1005   CO2 31 05/06/2013 0804   BUN 15.6 05/08/2013 1005   BUN 17 05/06/2013 0804   CREATININE 1.2 05/08/2013 1005   CREATININE 1.17  05/06/2013 0804   CREATININE 1.28 01/14/2013 1553      Component Value Date/Time   CALCIUM 9.7 05/08/2013 1005   CALCIUM 9.1 05/06/2013 0804   ALKPHOS 58 05/08/2013 1005   ALKPHOS 56 05/06/2013 0804   AST 18 05/08/2013 1005   AST 20 05/06/2013 0804   ALT 12 05/08/2013 1005   ALT 11 05/06/2013 0804   BILITOT 1.28* 05/08/2013 1005   BILITOT 0.8 05/06/2013 0804       Basic Metabolic Panel:  Recent Labs Lab 05/06/13 0804 05/08/13 1005  NA 139 140  K 3.7 3.8  CL 97  --   CO2 31 29  GLUCOSE 224* 248*  BUN 17 15.6  CREATININE 1.17 1.2  CALCIUM 9.1 9.7   GFR Estimated Creatinine Clearance: 72.1 ml/min (by C-G formula based on Cr of 1.2). Liver Function Tests:  Recent Labs Lab 05/06/13 0804 05/08/13 1005  AST 20 18  ALT 11 12  ALKPHOS 56 58  BILITOT  0.8 1.28*  PROT 5.7* 6.1*  ALBUMIN 3.1* 3.1*   No results found for this basename: LIPASE, AMYLASE,  in the last 168 hours No results found for this basename: AMMONIA,  in the last 168 hours Coagulation profile  Recent Labs Lab 05/06/13 0804  INR 1.27    CBC:  Recent Labs Lab 05/06/13 0804 05/08/13 1005  WBC 4.8 4.6  NEUTROABS 3.2 3.3  HGB 11.3* 11.0*  HCT 33.0* 32.7*  MCV 95.1 98.0  PLT 58* 56 few Large platelets present*    Anemia work up No results found for this basename: VITAMINB12, FOLATE, FERRITIN, TIBC, IRON, RETICCTPCT,  in the last 72 hours  Studies:  No results found.   RADIOGRAPHIC STUDIES: Nm Liver Img Spect  05/06/2013   EXAM: NUCLEAR MEDICINE LIVER SCAN with SPECT  TECHNIQUE: Abdominal images were obtained in multiple projections after intravenous injection of radiopharmaceutical. Post processing fusion with CT performed.  RADIOPHARMACEUTICALS:  3.3MILLI CURIE MAA TECHNETIUM TO 75M ALBUMIN AGGREGATED  COMPARISON:  IR ANGIO/VISCERAL dated 05/06/2013; CT ABDOMEN W/O CM dated 03/21/2013  FINDINGS: Intra-arterial injected MAA localizes to the liver. There is a curvilinear focus of activity extending  anterior to the right hepatic lobe which may represent a vascular structure. No evidence of bowel or stomach uptake.  Lung shunt fraction equals 3.2%.  IMPRESSION: 1. Curvilinear activity anterior to the right hepatic lobe may represent a vascular structure such as a falciform ligament artery. 2. Lung shunt fraction equals 3.2%. Findings conveyed toDAYNE HASSELL III on 05/06/2013  at17:52.   Electronically Signed   By: Genevive Bi M.D.   On: 05/06/2013 17:54   Ir Angiogram Visceral Selective  05/06/2013   CLINICAL DATA:  Cirrhosis, post TIPS. Multifocal hepatoma. Preop planning for Y 90 radio embolization.  EXAM: ADDITIONAL ARTERIOGRAPHY; SELECTIVE VISCERAL ARTERIOGRAPHY; IR ULTRASOUND GUIDANCE VASC ACCESS RIGHT; IR EMBO TUMOR ORGAN ISCHEMIA INFARCT INC GUIDE ROADMAPPING  MEDICATIONS: Intravenous Fentanyl and Versed were administered as conscious sedation during continuous cardiorespiratory monitoring by the radiology RN, with a total moderate sedation time of 100 minutes.  CONTRAST:  OMNIPAQUE IOHEXOL 300 MG/ML  SOLN  FLUOROSCOPY TIME:  20 min 24 seconds  PROCEDURE: The procedure, risks (including but not limited to bleeding, infection, organ damage ), benefits, and alternatives were explained to the patient. Questions regarding the procedure were encouraged and answered. The patient understands and consents to the procedure.  Right femoral region prepped and draped in usual sterile fashion. Maximal barrier sterile technique was utilized including caps, mask, sterile gowns, sterile gloves, sterile drape, hand hygiene and skin antiseptic. After the skin was infiltrated with 1% lidocaine, the right common femoral artery was accessed with a 21 gauge micropuncture needle under real-time ultrasound guidance. Needle exchanged over 018 guidewire for transitional dilator. This allowed passage of a Bentson wire into the abdominal aorta. Over this, a 6 [place] vascular sheath was placed, through which a 5 [place]  C2 catheter was advanced and used to selectively catheterize the superior mesenteric artery for selective arteriography. The C2 was then utilized to selectively catheterize the celiac axis for selective arteriography. The C2 catheter was then advanced into the common hepatic artery for selective arteriography. The C2 catheter was then advanced into the proximal gastroduodenal artery. A renegade micro catheter was coaxially advanced and coil embolization of the gastroduodenal artery performed with 5 and 6 mm interlock coils. The renegade micro catheter was then used to selectively catheterize the left hepatic artery for selective arteriography. The renegade micro catheter was  withdrawn, and the C2 advanced to selectively catheterize the right hepatic artery for selective arteriography. The C2 was then pulled back into the common hepatic artery for follow-up arteriography of the embolization. After this, this renegade micro catheter was used to selectively catheterize the gastroduodenal artery for additional embolization using a 4 and 5 coils. The renegade micro catheter was then advanced into the Right gastric artery for coil embolization using 3 mm coils. After followup arteriography through the C2 catheter, additional coil embolization of the gastroduodenal artery and right gastric artery performed. After final follow-up arteriography, the renegade micro catheter was advanced into the proper hepatic artery just proximal to its bifurcation for delivery of theTc58mMAA test dose to the hepatic arterial bed. The micro catheter and C2 catheter were withdrawn. After confirmatory femoral arteriography, sheath was removed and hemostasis achieved with the aid of the Exoseal device. The patient tolerated the procedure well.  Complications: None  FINDINGS: Superior mesenteric arteriography shows the vessel to be widely patent, with no significant supply to the hepatic arterial tree.  Celiac arteriography demonstrates widely  patent common hepatic and splenic arteries. There is a large gastroduodenal artery supplied by the common hepatic artery. The right gastric artery originates from the proximal left hepatic artery. There is good antegrade flow into the left and right hepatic artery is and intrahepatic branches. Minimal tumor staining is evident. Patency of the TIPS stent not assessed on these arterial injections. The gastroduodenal artery was embolized using multiple coils as above. The right gastric artery was embolized using multiple coils as above. The results of the MAA test bolus to the hepatic arterial tree will be reported separately.  IMPRESSION: 1. Mesenteric anatomy amenable to Y-90 radio embolization, post coil embolization of right gastric and gastroduodenal arteries. 2. Results of Tc37mMAA test injection to the hepatic arterial tree to assess shunt function will be reported separately.   Electronically Signed   By: Arne Cleveland M.D.   On: 05/06/2013 16:44   Ir Angiogram Visceral Selective  05/06/2013   CLINICAL DATA:  Cirrhosis, post TIPS. Multifocal hepatoma. Preop planning for Y 90 radio embolization.  EXAM: ADDITIONAL ARTERIOGRAPHY; SELECTIVE VISCERAL ARTERIOGRAPHY; IR ULTRASOUND GUIDANCE VASC ACCESS RIGHT; IR EMBO TUMOR ORGAN ISCHEMIA INFARCT INC GUIDE ROADMAPPING  MEDICATIONS: Intravenous Fentanyl and Versed were administered as conscious sedation during continuous cardiorespiratory monitoring by the radiology RN, with a total moderate sedation time of 100 minutes.  CONTRAST:  16mL OMNIPAQUE IOHEXOL 300 MG/ML  SOLN  FLUOROSCOPY TIME:  20 min 24 seconds  PROCEDURE: The procedure, risks (including but not limited to bleeding, infection, organ damage ), benefits, and alternatives were explained to the patient. Questions regarding the procedure were encouraged and answered. The patient understands and consents to the procedure.  Right femoral region prepped and draped in usual sterile fashion. Maximal barrier  sterile technique was utilized including caps, mask, sterile gowns, sterile gloves, sterile drape, hand hygiene and skin antiseptic. After the skin was infiltrated with 1% lidocaine, the right common femoral artery was accessed with a 21 gauge micropuncture needle under real-time ultrasound guidance. Needle exchanged over 018 guidewire for transitional dilator. This allowed passage of a Bentson wire into the abdominal aorta. Over this, a 6 [place] vascular sheath was placed, through which a 5 [place] C2 catheter was advanced and used to selectively catheterize the superior mesenteric artery for selective arteriography. The C2 was then utilized to selectively catheterize the celiac axis for selective arteriography. The C2 catheter was then advanced into the common hepatic artery  for selective arteriography. The C2 catheter was then advanced into the proximal gastroduodenal artery. A renegade micro catheter was coaxially advanced and coil embolization of the gastroduodenal artery performed with 5 and 6 mm interlock coils. The renegade micro catheter was then used to selectively catheterize the left hepatic artery for selective arteriography. The renegade micro catheter was withdrawn, and the C2 advanced to selectively catheterize the right hepatic artery for selective arteriography. The C2 was then pulled back into the common hepatic artery for follow-up arteriography of the embolization. After this, this renegade micro catheter was used to selectively catheterize the gastroduodenal artery for additional embolization using a 4 and 5 coils. The renegade micro catheter was then advanced into the Right gastric artery for coil embolization using 3 mm coils. After followup arteriography through the C2 catheter, additional coil embolization of the gastroduodenal artery and right gastric artery performed. After final follow-up arteriography, the renegade micro catheter was advanced into the proper hepatic artery just proximal  to its bifurcation for delivery of theTc20mMAA test dose to the hepatic arterial bed. The micro catheter and C2 catheter were withdrawn. After confirmatory femoral arteriography, sheath was removed and hemostasis achieved with the aid of the Exoseal device. The patient tolerated the procedure well.  Complications: None  FINDINGS: Superior mesenteric arteriography shows the vessel to be widely patent, with no significant supply to the hepatic arterial tree.  Celiac arteriography demonstrates widely patent common hepatic and splenic arteries. There is a large gastroduodenal artery supplied by the common hepatic artery. The right gastric artery originates from the proximal left hepatic artery. There is good antegrade flow into the left and right hepatic artery is and intrahepatic branches. Minimal tumor staining is evident. Patency of the TIPS stent not assessed on these arterial injections. The gastroduodenal artery was embolized using multiple coils as above. The right gastric artery was embolized using multiple coils as above. The results of the MAA test bolus to the hepatic arterial tree will be reported separately.  IMPRESSION: 1. Mesenteric anatomy amenable to Y-90 radio embolization, post coil embolization of right gastric and gastroduodenal arteries. 2. Results of Tc37mMAA test injection to the hepatic arterial tree to assess shunt function will be reported separately.   Electronically Signed   By: Arne Cleveland M.D.   On: 05/06/2013 16:44   Ir Angiogram Selective Each Additional Vessel  05/06/2013   CLINICAL DATA:  Cirrhosis, post TIPS. Multifocal hepatoma. Preop planning for Y 90 radio embolization.  EXAM: ADDITIONAL ARTERIOGRAPHY; SELECTIVE VISCERAL ARTERIOGRAPHY; IR ULTRASOUND GUIDANCE VASC ACCESS RIGHT; IR EMBO TUMOR ORGAN ISCHEMIA INFARCT INC GUIDE ROADMAPPING  MEDICATIONS: Intravenous Fentanyl and Versed were administered as conscious sedation during continuous cardiorespiratory monitoring by the  radiology RN, with a total moderate sedation time of 100 minutes.  CONTRAST:  148mL OMNIPAQUE IOHEXOL 300 MG/ML  SOLN  FLUOROSCOPY TIME:  20 min 24 seconds  PROCEDURE: The procedure, risks (including but not limited to bleeding, infection, organ damage ), benefits, and alternatives were explained to the patient. Questions regarding the procedure were encouraged and answered. The patient understands and consents to the procedure.  Right femoral region prepped and draped in usual sterile fashion. Maximal barrier sterile technique was utilized including caps, mask, sterile gowns, sterile gloves, sterile drape, hand hygiene and skin antiseptic. After the skin was infiltrated with 1% lidocaine, the right common femoral artery was accessed with a 21 gauge micropuncture needle under real-time ultrasound guidance. Needle exchanged over 018 guidewire for transitional dilator. This allowed passage of a Bentson  wire into the abdominal aorta. Over this, a 6 [place] vascular sheath was placed, through which a 5 [place] C2 catheter was advanced and used to selectively catheterize the superior mesenteric artery for selective arteriography. The C2 was then utilized to selectively catheterize the celiac axis for selective arteriography. The C2 catheter was then advanced into the common hepatic artery for selective arteriography. The C2 catheter was then advanced into the proximal gastroduodenal artery. A renegade micro catheter was coaxially advanced and coil embolization of the gastroduodenal artery performed with 5 and 6 mm interlock coils. The renegade micro catheter was then used to selectively catheterize the left hepatic artery for selective arteriography. The renegade micro catheter was withdrawn, and the C2 advanced to selectively catheterize the right hepatic artery for selective arteriography. The C2 was then pulled back into the common hepatic artery for follow-up arteriography of the embolization. After this, this renegade  micro catheter was used to selectively catheterize the gastroduodenal artery for additional embolization using a 4 and 5 coils. The renegade micro catheter was then advanced into the Right gastric artery for coil embolization using 3 mm coils. After followup arteriography through the C2 catheter, additional coil embolization of the gastroduodenal artery and right gastric artery performed. After final follow-up arteriography, the renegade micro catheter was advanced into the proper hepatic artery just proximal to its bifurcation for delivery of theTc86mMAA test dose to the hepatic arterial bed. The micro catheter and C2 catheter were withdrawn. After confirmatory femoral arteriography, sheath was removed and hemostasis achieved with the aid of the Exoseal device. The patient tolerated the procedure well.  Complications: None  FINDINGS: Superior mesenteric arteriography shows the vessel to be widely patent, with no significant supply to the hepatic arterial tree.  Celiac arteriography demonstrates widely patent common hepatic and splenic arteries. There is a large gastroduodenal artery supplied by the common hepatic artery. The right gastric artery originates from the proximal left hepatic artery. There is good antegrade flow into the left and right hepatic artery is and intrahepatic branches. Minimal tumor staining is evident. Patency of the TIPS stent not assessed on these arterial injections. The gastroduodenal artery was embolized using multiple coils as above. The right gastric artery was embolized using multiple coils as above. The results of the MAA test bolus to the hepatic arterial tree will be reported separately.  IMPRESSION: 1. Mesenteric anatomy amenable to Y-90 radio embolization, post coil embolization of right gastric and gastroduodenal arteries. 2. Results of Tc104mMAA test injection to the hepatic arterial tree to assess shunt function will be reported separately.   Electronically Signed   By: Arne Cleveland M.D.   On: 05/06/2013 16:44   Ir Angiogram Selective Each Additional Vessel  05/06/2013   CLINICAL DATA:  Cirrhosis, post TIPS. Multifocal hepatoma. Preop planning for Y 90 radio embolization.  EXAM: ADDITIONAL ARTERIOGRAPHY; SELECTIVE VISCERAL ARTERIOGRAPHY; IR ULTRASOUND GUIDANCE VASC ACCESS RIGHT; IR EMBO TUMOR ORGAN ISCHEMIA INFARCT INC GUIDE ROADMAPPING  MEDICATIONS: Intravenous Fentanyl and Versed were administered as conscious sedation during continuous cardiorespiratory monitoring by the radiology RN, with a total moderate sedation time of 100 minutes.  CONTRAST:  15mL OMNIPAQUE IOHEXOL 300 MG/ML  SOLN  FLUOROSCOPY TIME:  20 min 24 seconds  PROCEDURE: The procedure, risks (including but not limited to bleeding, infection, organ damage ), benefits, and alternatives were explained to the patient. Questions regarding the procedure were encouraged and answered. The patient understands and consents to the procedure.  Right femoral region prepped and draped in usual sterile  fashion. Maximal barrier sterile technique was utilized including caps, mask, sterile gowns, sterile gloves, sterile drape, hand hygiene and skin antiseptic. After the skin was infiltrated with 1% lidocaine, the right common femoral artery was accessed with a 21 gauge micropuncture needle under real-time ultrasound guidance. Needle exchanged over 018 guidewire for transitional dilator. This allowed passage of a Bentson wire into the abdominal aorta. Over this, a 6 [place] vascular sheath was placed, through which a 5 [place] C2 catheter was advanced and used to selectively catheterize the superior mesenteric artery for selective arteriography. The C2 was then utilized to selectively catheterize the celiac axis for selective arteriography. The C2 catheter was then advanced into the common hepatic artery for selective arteriography. The C2 catheter was then advanced into the proximal gastroduodenal artery. A renegade micro catheter was  coaxially advanced and coil embolization of the gastroduodenal artery performed with 5 and 6 mm interlock coils. The renegade micro catheter was then used to selectively catheterize the left hepatic artery for selective arteriography. The renegade micro catheter was withdrawn, and the C2 advanced to selectively catheterize the right hepatic artery for selective arteriography. The C2 was then pulled back into the common hepatic artery for follow-up arteriography of the embolization. After this, this renegade micro catheter was used to selectively catheterize the gastroduodenal artery for additional embolization using a 4 and 5 coils. The renegade micro catheter was then advanced into the Right gastric artery for coil embolization using 3 mm coils. After followup arteriography through the C2 catheter, additional coil embolization of the gastroduodenal artery and right gastric artery performed. After final follow-up arteriography, the renegade micro catheter was advanced into the proper hepatic artery just proximal to its bifurcation for delivery of theTc75mMAA test dose to the hepatic arterial bed. The micro catheter and C2 catheter were withdrawn. After confirmatory femoral arteriography, sheath was removed and hemostasis achieved with the aid of the Exoseal device. The patient tolerated the procedure well.  Complications: None  FINDINGS: Superior mesenteric arteriography shows the vessel to be widely patent, with no significant supply to the hepatic arterial tree.  Celiac arteriography demonstrates widely patent common hepatic and splenic arteries. There is a large gastroduodenal artery supplied by the common hepatic artery. The right gastric artery originates from the proximal left hepatic artery. There is good antegrade flow into the left and right hepatic artery is and intrahepatic branches. Minimal tumor staining is evident. Patency of the TIPS stent not assessed on these arterial injections. The gastroduodenal  artery was embolized using multiple coils as above. The right gastric artery was embolized using multiple coils as above. The results of the MAA test bolus to the hepatic arterial tree will be reported separately.  IMPRESSION: 1. Mesenteric anatomy amenable to Y-90 radio embolization, post coil embolization of right gastric and gastroduodenal arteries. 2. Results of Tc18mMAA test injection to the hepatic arterial tree to assess shunt function will be reported separately.   Electronically Signed   By: Arne Cleveland M.D.   On: 05/06/2013 16:44   Ir Angiogram Selective Each Additional Vessel  05/06/2013   CLINICAL DATA:  Cirrhosis, post TIPS. Multifocal hepatoma. Preop planning for Y 90 radio embolization.  EXAM: ADDITIONAL ARTERIOGRAPHY; SELECTIVE VISCERAL ARTERIOGRAPHY; IR ULTRASOUND GUIDANCE VASC ACCESS RIGHT; IR EMBO TUMOR ORGAN ISCHEMIA INFARCT INC GUIDE ROADMAPPING  MEDICATIONS: Intravenous Fentanyl and Versed were administered as conscious sedation during continuous cardiorespiratory monitoring by the radiology RN, with a total moderate sedation time of 100 minutes.  CONTRAST:  123mL OMNIPAQUE IOHEXOL 300  MG/ML  SOLN  FLUOROSCOPY TIME:  20 min 24 seconds  PROCEDURE: The procedure, risks (including but not limited to bleeding, infection, organ damage ), benefits, and alternatives were explained to the patient. Questions regarding the procedure were encouraged and answered. The patient understands and consents to the procedure.  Right femoral region prepped and draped in usual sterile fashion. Maximal barrier sterile technique was utilized including caps, mask, sterile gowns, sterile gloves, sterile drape, hand hygiene and skin antiseptic. After the skin was infiltrated with 1% lidocaine, the right common femoral artery was accessed with a 21 gauge micropuncture needle under real-time ultrasound guidance. Needle exchanged over 018 guidewire for transitional dilator. This allowed passage of a Bentson wire into  the abdominal aorta. Over this, a 6 [place] vascular sheath was placed, through which a 5 [place] C2 catheter was advanced and used to selectively catheterize the superior mesenteric artery for selective arteriography. The C2 was then utilized to selectively catheterize the celiac axis for selective arteriography. The C2 catheter was then advanced into the common hepatic artery for selective arteriography. The C2 catheter was then advanced into the proximal gastroduodenal artery. A renegade micro catheter was coaxially advanced and coil embolization of the gastroduodenal artery performed with 5 and 6 mm interlock coils. The renegade micro catheter was then used to selectively catheterize the left hepatic artery for selective arteriography. The renegade micro catheter was withdrawn, and the C2 advanced to selectively catheterize the right hepatic artery for selective arteriography. The C2 was then pulled back into the common hepatic artery for follow-up arteriography of the embolization. After this, this renegade micro catheter was used to selectively catheterize the gastroduodenal artery for additional embolization using a 4 and 5 coils. The renegade micro catheter was then advanced into the Right gastric artery for coil embolization using 3 mm coils. After followup arteriography through the C2 catheter, additional coil embolization of the gastroduodenal artery and right gastric artery performed. After final follow-up arteriography, the renegade micro catheter was advanced into the proper hepatic artery just proximal to its bifurcation for delivery of theTc51mMAA test dose to the hepatic arterial bed. The micro catheter and C2 catheter were withdrawn. After confirmatory femoral arteriography, sheath was removed and hemostasis achieved with the aid of the Exoseal device. The patient tolerated the procedure well.  Complications: None  FINDINGS: Superior mesenteric arteriography shows the vessel to be widely patent, with  no significant supply to the hepatic arterial tree.  Celiac arteriography demonstrates widely patent common hepatic and splenic arteries. There is a large gastroduodenal artery supplied by the common hepatic artery. The right gastric artery originates from the proximal left hepatic artery. There is good antegrade flow into the left and right hepatic artery is and intrahepatic branches. Minimal tumor staining is evident. Patency of the TIPS stent not assessed on these arterial injections. The gastroduodenal artery was embolized using multiple coils as above. The right gastric artery was embolized using multiple coils as above. The results of the MAA test bolus to the hepatic arterial tree will be reported separately.  IMPRESSION: 1. Mesenteric anatomy amenable to Y-90 radio embolization, post coil embolization of right gastric and gastroduodenal arteries. 2. Results of Tc59mMAA test injection to the hepatic arterial tree to assess shunt function will be reported separately.   Electronically Signed   By: Arne Cleveland M.D.   On: 05/06/2013 16:44   Ir Angiogram Selective Each Additional Vessel  05/06/2013   CLINICAL DATA:  Cirrhosis, post TIPS. Multifocal hepatoma. Preop planning for Y  90 radio embolization.  EXAM: ADDITIONAL ARTERIOGRAPHY; SELECTIVE VISCERAL ARTERIOGRAPHY; IR ULTRASOUND GUIDANCE VASC ACCESS RIGHT; IR EMBO TUMOR ORGAN ISCHEMIA INFARCT INC GUIDE ROADMAPPING  MEDICATIONS: Intravenous Fentanyl and Versed were administered as conscious sedation during continuous cardiorespiratory monitoring by the radiology RN, with a total moderate sedation time of 100 minutes.  CONTRAST:  150mL OMNIPAQUE IOHEXOL 300 MG/ML  SOLN  FLUOROSCOPY TIME:  20 min 24 seconds  PROCEDURE: The procedure, risks (including but not limited to bleeding, infection, organ damage ), benefits, and alternatives were explained to the patient. Questions regarding the procedure were encouraged and answered. The patient understands and  consents to the procedure.  Right femoral region prepped and draped in usual sterile fashion. Maximal barrier sterile technique was utilized including caps, mask, sterile gowns, sterile gloves, sterile drape, hand hygiene and skin antiseptic. After the skin was infiltrated with 1% lidocaine, the right common femoral artery was accessed with a 21 gauge micropuncture needle under real-time ultrasound guidance. Needle exchanged over 018 guidewire for transitional dilator. This allowed passage of a Bentson wire into the abdominal aorta. Over this, a 6 [place] vascular sheath was placed, through which a 5 [place] C2 catheter was advanced and used to selectively catheterize the superior mesenteric artery for selective arteriography. The C2 was then utilized to selectively catheterize the celiac axis for selective arteriography. The C2 catheter was then advanced into the common hepatic artery for selective arteriography. The C2 catheter was then advanced into the proximal gastroduodenal artery. A renegade micro catheter was coaxially advanced and coil embolization of the gastroduodenal artery performed with 5 and 6 mm interlock coils. The renegade micro catheter was then used to selectively catheterize the left hepatic artery for selective arteriography. The renegade micro catheter was withdrawn, and the C2 advanced to selectively catheterize the right hepatic artery for selective arteriography. The C2 was then pulled back into the common hepatic artery for follow-up arteriography of the embolization. After this, this renegade micro catheter was used to selectively catheterize the gastroduodenal artery for additional embolization using a 4 and 5 coils. The renegade micro catheter was then advanced into the Right gastric artery for coil embolization using 3 mm coils. After followup arteriography through the C2 catheter, additional coil embolization of the gastroduodenal artery and right gastric artery performed. After final  follow-up arteriography, the renegade micro catheter was advanced into the proper hepatic artery just proximal to its bifurcation for delivery of theTc79mMAA test dose to the hepatic arterial bed. The micro catheter and C2 catheter were withdrawn. After confirmatory femoral arteriography, sheath was removed and hemostasis achieved with the aid of the Exoseal device. The patient tolerated the procedure well.  Complications: None  FINDINGS: Superior mesenteric arteriography shows the vessel to be widely patent, with no significant supply to the hepatic arterial tree.  Celiac arteriography demonstrates widely patent common hepatic and splenic arteries. There is a large gastroduodenal artery supplied by the common hepatic artery. The right gastric artery originates from the proximal left hepatic artery. There is good antegrade flow into the left and right hepatic artery is and intrahepatic branches. Minimal tumor staining is evident. Patency of the TIPS stent not assessed on these arterial injections. The gastroduodenal artery was embolized using multiple coils as above. The right gastric artery was embolized using multiple coils as above. The results of the MAA test bolus to the hepatic arterial tree will be reported separately.  IMPRESSION: 1. Mesenteric anatomy amenable to Y-90 radio embolization, post coil embolization of right gastric and gastroduodenal  arteries. 2. Results of Tc47mMAA test injection to the hepatic arterial tree to assess shunt function will be reported separately.   Electronically Signed   By: Arne Cleveland M.D.   On: 05/06/2013 16:44   Ir US Guide Vasc Access Right  05/06/2013   CLINICAL DATA:  Cirrhosis, post TIPS. Multifocal hepatoma. Preop planning for Y 90 radio embolization.  EXAM: ADDITIONAL ARTERIOGRAPHY; SELECTIVE VISCERAL ARTERIOGRAPHY; IR ULTRASOUND GUIDANCE VASC ACCESS RIGHT; IR EMBO TUMOR ORGAN ISCHEMIA INFARCT INC GUIDE ROADMAPPING  MEDICATIONS: Intravenous Fentanyl and Versed  were administered as conscious sedation during continuous cardiorespiratory monitoring by the radiology RN, with a total moderate sedation time of 100 minutes.  CONTRAST:  182mL OMNIPAQUE IOHEXOL 300 MG/ML  SOLN  FLUOROSCOPY TIME:  20 min 24 seconds  PROCEDURE: The procedure, risks (including but not limited to bleeding, infection, organ damage ), benefits, and alternatives were explained to the patient. Questions regarding the procedure were encouraged and answered. The patient understands and consents to the procedure.  Right femoral region prepped and draped in usual sterile fashion. Maximal barrier sterile technique was utilized including caps, mask, sterile gowns, sterile gloves, sterile drape, hand hygiene and skin antiseptic. After the skin was infiltrated with 1% lidocaine, the right common femoral artery was accessed with a 21 gauge micropuncture needle under real-time ultrasound guidance. Needle exchanged over 018 guidewire for transitional dilator. This allowed passage of a Bentson wire into the abdominal aorta. Over this, a 6 [place] vascular sheath was placed, through which a 5 [place] C2 catheter was advanced and used to selectively catheterize the superior mesenteric artery for selective arteriography. The C2 was then utilized to selectively catheterize the celiac axis for selective arteriography. The C2 catheter was then advanced into the common hepatic artery for selective arteriography. The C2 catheter was then advanced into the proximal gastroduodenal artery. A renegade micro catheter was coaxially advanced and coil embolization of the gastroduodenal artery performed with 5 and 6 mm interlock coils. The renegade micro catheter was then used to selectively catheterize the left hepatic artery for selective arteriography. The renegade micro catheter was withdrawn, and the C2 advanced to selectively catheterize the right hepatic artery for selective arteriography. The C2 was then pulled back into the  common hepatic artery for follow-up arteriography of the embolization. After this, this renegade micro catheter was used to selectively catheterize the gastroduodenal artery for additional embolization using a 4 and 5 coils. The renegade micro catheter was then advanced into the Right gastric artery for coil embolization using 3 mm coils. After followup arteriography through the C2 catheter, additional coil embolization of the gastroduodenal artery and right gastric artery performed. After final follow-up arteriography, the renegade micro catheter was advanced into the proper hepatic artery just proximal to its bifurcation for delivery of theTc21mMAA test dose to the hepatic arterial bed. The micro catheter and C2 catheter were withdrawn. After confirmatory femoral arteriography, sheath was removed and hemostasis achieved with the aid of the Exoseal device. The patient tolerated the procedure well.  Complications: None  FINDINGS: Superior mesenteric arteriography shows the vessel to be widely patent, with no significant supply to the hepatic arterial tree.  Celiac arteriography demonstrates widely patent common hepatic and splenic arteries. There is a large gastroduodenal artery supplied by the common hepatic artery. The right gastric artery originates from the proximal left hepatic artery. There is good antegrade flow into the left and right hepatic artery is and intrahepatic branches. Minimal tumor staining is evident. Patency of the TIPS stent not  assessed on these arterial injections. The gastroduodenal artery was embolized using multiple coils as above. The right gastric artery was embolized using multiple coils as above. The results of the MAA test bolus to the hepatic arterial tree will be reported separately.  IMPRESSION: 1. Mesenteric anatomy amenable to Y-90 radio embolization, post coil embolization of right gastric and gastroduodenal arteries. 2. Results of Tc90mMAA test injection to the hepatic arterial  tree to assess shunt function will be reported separately.   Electronically Signed   By: Arne Cleveland M.D.   On: 05/06/2013 16:44   Dg Chest Port 1 View  (if Code Sepsis Called)  04/29/2013   CLINICAL DATA:  Fever, nausea and vomiting.  EXAM: PORTABLE CHEST - 1 VIEW  COMPARISON:  Chest x-ray 06/21/2012.  FINDINGS: Elevation of the left hemidiaphragm is unchanged. Lung volumes are normal. No consolidative airspace disease. No pleural effusions. Mild cephalization of the pulmonary vasculature, without frank pulmonary edema. Mild cardiomegaly. The patient is rotated to the right On today's exam, resulting in distortion of the mediastinal contours and reduced diagnostic sensitivity and specificity for mediastinal pathology. Atherosclerosis in the thoracic aorta. Status post median sternotomy for CABG.  IMPRESSION: 1. Cardiomegaly with pulmonary venous congestion, but no frank pulmonary edema. 2. Atherosclerosis.   Electronically Signed   By: Vinnie Langton M.D.   On: 04/29/2013 14:03   Ir Embo Tumor Organ Ischemia Infarct Inc Guide Roadmapping  05/06/2013   CLINICAL DATA:  Cirrhosis, post TIPS. Multifocal hepatoma. Preop planning for Y 90 radio embolization.  EXAM: ADDITIONAL ARTERIOGRAPHY; SELECTIVE VISCERAL ARTERIOGRAPHY; IR ULTRASOUND GUIDANCE VASC ACCESS RIGHT; IR EMBO TUMOR ORGAN ISCHEMIA INFARCT INC GUIDE ROADMAPPING  MEDICATIONS: Intravenous Fentanyl and Versed were administered as conscious sedation during continuous cardiorespiratory monitoring by the radiology RN, with a total moderate sedation time of 100 minutes.  CONTRAST:  110mL OMNIPAQUE IOHEXOL 300 MG/ML  SOLN  FLUOROSCOPY TIME:  20 min 24 seconds  PROCEDURE: The procedure, risks (including but not limited to bleeding, infection, organ damage ), benefits, and alternatives were explained to the patient. Questions regarding the procedure were encouraged and answered. The patient understands and consents to the procedure.  Right femoral region  prepped and draped in usual sterile fashion. Maximal barrier sterile technique was utilized including caps, mask, sterile gowns, sterile gloves, sterile drape, hand hygiene and skin antiseptic. After the skin was infiltrated with 1% lidocaine, the right common femoral artery was accessed with a 21 gauge micropuncture needle under real-time ultrasound guidance. Needle exchanged over 018 guidewire for transitional dilator. This allowed passage of a Bentson wire into the abdominal aorta. Over this, a 6 [place] vascular sheath was placed, through which a 5 [place] C2 catheter was advanced and used to selectively catheterize the superior mesenteric artery for selective arteriography. The C2 was then utilized to selectively catheterize the celiac axis for selective arteriography. The C2 catheter was then advanced into the common hepatic artery for selective arteriography. The C2 catheter was then advanced into the proximal gastroduodenal artery. A renegade micro catheter was coaxially advanced and coil embolization of the gastroduodenal artery performed with 5 and 6 mm interlock coils. The renegade micro catheter was then used to selectively catheterize the left hepatic artery for selective arteriography. The renegade micro catheter was withdrawn, and the C2 advanced to selectively catheterize the right hepatic artery for selective arteriography. The C2 was then pulled back into the common hepatic artery for follow-up arteriography of the embolization. After this, this renegade micro catheter was used to  selectively catheterize the gastroduodenal artery for additional embolization using a 4 and 5 coils. The renegade micro catheter was then advanced into the Right gastric artery for coil embolization using 3 mm coils. After followup arteriography through the C2 catheter, additional coil embolization of the gastroduodenal artery and right gastric artery performed. After final follow-up arteriography, the renegade micro  catheter was advanced into the proper hepatic artery just proximal to its bifurcation for delivery of theTc15mMAA test dose to the hepatic arterial bed. The micro catheter and C2 catheter were withdrawn. After confirmatory femoral arteriography, sheath was removed and hemostasis achieved with the aid of the Exoseal device. The patient tolerated the procedure well.  Complications: None  FINDINGS: Superior mesenteric arteriography shows the vessel to be widely patent, with no significant supply to the hepatic arterial tree.  Celiac arteriography demonstrates widely patent common hepatic and splenic arteries. There is a large gastroduodenal artery supplied by the common hepatic artery. The right gastric artery originates from the proximal left hepatic artery. There is good antegrade flow into the left and right hepatic artery is and intrahepatic branches. Minimal tumor staining is evident. Patency of the TIPS stent not assessed on these arterial injections. The gastroduodenal artery was embolized using multiple coils as above. The right gastric artery was embolized using multiple coils as above. The results of the MAA test bolus to the hepatic arterial tree will be reported separately.  IMPRESSION: 1. Mesenteric anatomy amenable to Y-90 radio embolization, post coil embolization of right gastric and gastroduodenal arteries. 2. Results of Tc74mMAA test injection to the hepatic arterial tree to assess shunt function will be reported separately.   Electronically Signed   By: Arne Cleveland M.D.   On: 05/06/2013 16:44   Ir Radiologist Eval & Mgmt  04/26/2013   : April 25, 2013  Concha Norway, M.D.  [facility].  [place], [place]  [address]  RE:  [name] (DOB: 1935/08/21)  Dear Dr. Juliann Mule:  Thank you for the referral of your patient Ellwyn Amador for consultation regarding his newly diagnosed hepatocellular carcinoma. The patient is known to our service since February of 2010, when I saw him urgently  for acute upper GI bleed secondary to portal venous hypertension which could not be controlled endoscopically, and necessitated placement of a TIPS. He has done well since the procedure, no further bleeding and mild hepatic encephalopathy controlled by lactulose.  As part of a recent diagnostic work-up for swallowing issues, multiple liver lesions were noted on a CT of 03/21/13. Follow-up MR demonstrated characteristic findings of multifocal hepatocellular carcinoma, corroborated with an elevated AFP tumor maker. He was reviewed at GI Tumor Board, found not to be a candidate for curative hepatic transplantation and referred for consideration of transarterial liver embolization.  His past medical history is significant for diverticulosis, coronary artery disease, atrial fibrillation, aortic stenosis, hypertension, cirrhosis, alcoholism, thrombocytopenia, cellulitis, diabetes type 2 and chronic low back pain.  Past surgical history is significant for right knee arthroplasty, multiple lumbar surgeries and fusion, appendectomy, coronary bypass grafting, cataract extraction bilateral.  Current medications include acetaminophen, allopurinol, vitamin C, calcium-magnesium-zinc, fentanyl, iron, furosemide, gabapentin, glucose blood, hydroxyzine, insulin aspart, isosorbide mononitrate, lactulose, loratadine, metoprolol tartrate, nitroglycerin, omeprazole, penicillin v potassium, polyethylene glycol, prednisone, rifaximin, spironolactone, vitamin B1 and tizanidine.  He has allergy to shellfish, amoxicillin, aspirin, amoxicillin-pot clavulanate, buprenorphine Hcl-naloxone Hcl, codeine, ezetimibe, hydromorphone Hcl, meperidine Hcl, morphine, morphine sulfate, oxycodone and sulfonamide derivatives.  I spent the majority of the  45 minute consultation discussing the pathophysiology of hepatocellular carcinoma and treatment options. Unfortunately, he is not a transplant candidate which would be the only curative option. The  multifocal nature of his disease precludes percutaneous ablation options.  He is an appropriate candidate for catheter directed therapies for palliation. We discussed transarterial chemoembolization, bland embolization and Y-90 radioembolization. We discussed the technique, anticipated benefits, possible risks and side effects and possible complications of the different approaches. In general, these have all been shown to have similar efficacy in the setting of hepatoma. However, considering his ECOG status I recommend consideration primarily of Y-90 radioembolization to start with. This avoids the systemic toxicity of transarterial chemoembolization. Concurrent p.o. or IV chemotherapy regimen could be pursued, if deemed appropriate. To complete his work-up, we would need to do an arterial mapping study as well as assess shunt fraction to minimize nontarget radioembolization risk. He and his family seemed to understand and were anxious to proceed. Accordingly, we can set up outpatient hepatic arteriography and shunt fraction calculation in contemplation of transarterial Y-90 radioembolization of his multifocal hepatocellular carcinoma. We discussed that this would likely have no effect on the TIPS shunt. The hypervascular nature of hepatoma is advantageous to minimize the radiation dose to his underlying liver parenchyma. I do not anticipate a significant effect on his diabetes which has been recently more difficult to control.  I appreciate your referral of Mr. Toops for consideration of transarterial embolization of his hepatocellular carcinoma and we will proceed with the mapping as above. I will coordinate with you the necessary surveillance imaging and testing post procedure.  Sincerely,  D. Arne Cleveland, M.D.  Jonathon ResidesSilvano Rusk, M.D.  Unice Cobble, M.D.  Renato Shin, M.D.   Electronically Signed   By: Arne Cleveland M.D.   On: 04/26/2013 09:07   Nm Fusion  05/06/2013   EXAM: NUCLEAR MEDICINE LIVER  SCAN with SPECT  TECHNIQUE: Abdominal images were obtained in multiple projections after intravenous injection of radiopharmaceutical. Post processing fusion with CT performed.  RADIOPHARMACEUTICALS:  3.3MILLI CURIE MAA TECHNETIUM TO 31M ALBUMIN AGGREGATED  COMPARISON:  IR ANGIO/VISCERAL dated 05/06/2013; CT ABDOMEN W/O CM dated 03/21/2013  FINDINGS: Intra-arterial injected MAA localizes to the liver. There is a curvilinear focus of activity extending anterior to the right hepatic lobe which may represent a vascular structure. No evidence of bowel or stomach uptake.  Lung shunt fraction equals 3.2%.  IMPRESSION: 1. Curvilinear activity anterior to the right hepatic lobe may represent a vascular structure such as a falciform ligament artery. 2. Lung shunt fraction equals 3.2%. Findings conveyed toDAYNE HASSELL III on 05/06/2013  at17:52.   Electronically Signed   By: Suzy Bouchard M.D.   On: 05/06/2013 17:54    ASSESSMENT: Devondre R Dundon 78 y.o. male with a history of [place] (hepatocellular carcinoma) - Plan: CBC with Differential, Basic metabolic panel (Bmet) - [facility], CBC with Differential, Comprehensive metabolic panel (Cmet) - [facility], ondansetron (ZOFRAN) 8 MG tablet  Cellulitis of right lower extremity  Hepatic encephalopathy   PLAN:   1. HCC, newly diagnosed based on imaging (cT3N0M0) --We reviewed his imaging and labs consistent with primary [place]. I agree by [facility] staging, Mr. Tokarczyk has Intermediate (Stage B) [place] and may be a candidate for liver directed transarterial chemo or radioembolization depending on his ECOG status. He has sufficient hepatic reserve with a MELD of 13 and Br of 1.5 as calculated on labs from 1 month ago.  --As reported by Dr. Carlean Purl, his case  was discussed at the GI conference and the consensus was that there is no need for biopsy of the masses, they are [facility] and given numerous lesions (probably more than 3 on mr) that he is not a transplant candidate.  --He underwent Y-90  radioembolization on 05/06/2013.  His liver function is stable.  He is scheduled for another one on 04/082015. Will order CT of chest upon return visit to exclude evidence of metastatic disease.    2. RLE cellulitis, improving.  --He is scheduled to complete his 14-day course of antibiotics on 05/13/2013.   He is on doxycycline.  He is afebrile. He does not have a leukocytosis.   3. C. Difficile.  --He was discharged on vancomycin 125 mg qid and will also complete a 14-day course.  It is hard to determine if his diarrhea is due to his lactulose use versus C. Difficile.  He is not encephalopathic presently.    4. Anemia/Thrombocytopenia.  --Plts low secondary #2 and he is without signs of bleeding presently. Hemoglobin 11 and plts of 58K. Transfuse for bleeding.   5. Follow-up.  --We will have his follow in 4 weeks with consideration of CT of chest to exclude mets and labs.   All questions were answered. The patient knows to call the clinic with any problems, questions or concerns. We can certainly see the patient much sooner if necessary.  I spent 15 minutes counseling the patient face to face. The total time spent in the appointment was 25 minutes.    Kortnie Stovall, MD 05/12/2013 8:54 AM

## 2013-05-13 ENCOUNTER — Encounter: Payer: Self-pay | Admitting: Cardiology

## 2013-05-14 ENCOUNTER — Ambulatory Visit (INDEPENDENT_AMBULATORY_CARE_PROVIDER_SITE_OTHER): Payer: Medicare Other | Admitting: Nurse Practitioner

## 2013-05-14 ENCOUNTER — Encounter: Payer: Self-pay | Admitting: Nurse Practitioner

## 2013-05-14 VITALS — BP 132/72 | HR 92 | Ht 74.0 in | Wt 257.4 lb

## 2013-05-14 DIAGNOSIS — I2510 Atherosclerotic heart disease of native coronary artery without angina pectoris: Secondary | ICD-10-CM

## 2013-05-14 DIAGNOSIS — A0472 Enterocolitis due to Clostridium difficile, not specified as recurrent: Secondary | ICD-10-CM

## 2013-05-14 DIAGNOSIS — R1310 Dysphagia, unspecified: Secondary | ICD-10-CM

## 2013-05-14 MED ORDER — SACCHAROMYCES BOULARDII 250 MG PO CAPS: 250.0000 mg | ORAL_CAPSULE | Freq: Two times a day (BID) | ORAL | Status: DC

## 2013-05-14 NOTE — Patient Instructions (Signed)
We sent a prescription to [facility] . 1. FLorastor Probiotic Take 2 tab daily for 30 days.  Finish the Vancomycin.  Restart the Xifaxan. 1 tab twice daily.

## 2013-05-15 ENCOUNTER — Ambulatory Visit (HOSPITAL_BASED_OUTPATIENT_CLINIC_OR_DEPARTMENT_OTHER): Payer: Medicare Other

## 2013-05-15 ENCOUNTER — Encounter: Payer: Self-pay | Admitting: Cardiology

## 2013-05-15 ENCOUNTER — Telehealth: Payer: Self-pay | Admitting: Internal Medicine

## 2013-05-15 ENCOUNTER — Encounter: Payer: Self-pay | Admitting: Nurse Practitioner

## 2013-05-15 ENCOUNTER — Ambulatory Visit (INDEPENDENT_AMBULATORY_CARE_PROVIDER_SITE_OTHER): Payer: Medicare Other | Admitting: Cardiology

## 2013-05-15 VITALS — BP 138/52 | HR 60 | Ht 75.0 in | Wt 257.8 lb

## 2013-05-15 DIAGNOSIS — C228 Malignant neoplasm of liver, primary, unspecified as to type: Secondary | ICD-10-CM

## 2013-05-15 DIAGNOSIS — I359 Nonrheumatic aortic valve disorder, unspecified: Secondary | ICD-10-CM

## 2013-05-15 DIAGNOSIS — I350 Nonrheumatic aortic (valve) stenosis: Secondary | ICD-10-CM

## 2013-05-15 DIAGNOSIS — C220 Liver cell carcinoma: Secondary | ICD-10-CM

## 2013-05-15 DIAGNOSIS — I4891 Unspecified atrial fibrillation: Secondary | ICD-10-CM

## 2013-05-15 DIAGNOSIS — D649 Anemia, unspecified: Secondary | ICD-10-CM

## 2013-05-15 DIAGNOSIS — I10 Essential (primary) hypertension: Secondary | ICD-10-CM

## 2013-05-15 DIAGNOSIS — I2510 Atherosclerotic heart disease of native coronary artery without angina pectoris: Secondary | ICD-10-CM

## 2013-05-15 LAB — BASIC METABOLIC PANEL (CC13)
ANION GAP: 11 meq/L (ref 3–11)
BUN: 18.5 mg/dL (ref 7.0–26.0)
CALCIUM: 9.3 mg/dL (ref 8.4–10.4)
CO2: 26 mEq/L (ref 22–29)
Chloride: 102 mEq/L (ref 98–109)
Creatinine: 1.4 mg/dL — ABNORMAL HIGH (ref 0.7–1.3)
GLUCOSE: 242 mg/dL — AB (ref 70–140)
Potassium: 4 mEq/L (ref 3.5–5.1)
SODIUM: 140 meq/L (ref 136–145)

## 2013-05-15 LAB — CBC WITH DIFFERENTIAL/PLATELET
BASO%: 0.6 % (ref 0.0–2.0)
Basophils Absolute: 0 10*3/uL (ref 0.0–0.1)
EOS%: 0 % (ref 0.0–7.0)
Eosinophils Absolute: 0 10*3/uL (ref 0.0–0.5)
HCT: 31.9 % — ABNORMAL LOW (ref 38.4–49.9)
HGB: 10.4 g/dL — ABNORMAL LOW (ref 13.0–17.1)
LYMPH%: 15.6 % (ref 14.0–49.0)
MCH: 32 pg (ref 27.2–33.4)
MCHC: 32.4 g/dL (ref 32.0–36.0)
MCV: 98.5 fL — ABNORMAL HIGH (ref 79.3–98.0)
MONO#: 0.5 10*3/uL (ref 0.1–0.9)
MONO%: 10.9 % (ref 0.0–14.0)
NEUT#: 3.5 10*3/uL (ref 1.5–6.5)
NEUT%: 72.9 % (ref 39.0–75.0)
PLATELETS: 58 10*3/uL — AB (ref 140–400)
RBC: 3.24 10*6/uL — AB (ref 4.20–5.82)
RDW: 14.6 % (ref 11.0–14.6)
WBC: 4.8 10*3/uL (ref 4.0–10.3)
lymph#: 0.7 10*3/uL — ABNORMAL LOW (ref 0.9–3.3)

## 2013-05-15 NOTE — Telephone Encounter (Signed)
, °

## 2013-05-15 NOTE — Assessment & Plan Note (Signed)
Patient has a history of mild aortic stenosis. He's not having symptoms. We should plan to do a followup echo in a year.

## 2013-05-15 NOTE — Patient Instructions (Signed)
**Note De-identified Clema Skousen Obfuscation** Your physician recommends that you continue on your current medications as directed. Please refer to the Current Medication list given to you today.  Your physician wants you to follow-up in: 1 year. You will receive a reminder letter in the mail two months in advance. If you don't receive a letter, please call our office to schedule the follow-up appointment.  

## 2013-05-15 NOTE — Assessment & Plan Note (Signed)
He'll be receiving an advanced former radiation therapy for his hepatocellular carcinoma. He may receive chemotherapy after this.

## 2013-05-15 NOTE — Assessment & Plan Note (Signed)
Coronary disease is stable. Nuclear study in September, 2014 revealed no ischemia.

## 2013-05-15 NOTE — Progress Notes (Signed)
     History of Present Illness:  Patient is a 78 year old male known to Dr. Carlean Purl for history of cirrhosis and recently diagnosed hepatocellular carcinoma. Patient is status post radioembolization 05/06/13. Patient comes in today for evaluation of C. difficile diarrhea diagnosed 05/02/13. Patient was treated with 2 weeks of vancomycin, he has 2 days of treatment left. Patient was seen by PCP a few days ago and felt not to be responding appropriately to vancomycin. Patient had been on doxycycline for lower extremity cellulitis and after completion of doxycycline over the weekend, his diarrhea has significantly improved  On an unrelated topic, patient was recently had dilation of an esophageal stricture (December). His dysphasia has not improved, he is having problems with solids and liquids. He also has discomfort in chest when swallowing. Though patient has been on antibiotics recently it should be noted that this problem predates the antibiotics making Candida esophagitis unlikely.   Current Medications, Allergies, Past Medical History, Past Surgical History, Family History and Social History were reviewed in [employer] record.  Physical Exam: General: Pleasant, well developed , white male in no acute distress Head: Normocephalic and atraumatic Eyes:  sclerae anicteric, conjunctiva pink  Ears: Normal auditory acuity Lungs: Clear throughout to auscultation Heart: Regular rate and rhythm, + murmur Abdomen: Soft, non-tender. Normal bowel sounds  Musculoskeletal: Symmetrical with no gross deformities  Neurological: Alert oriented x 4, grossly nonfocal.Mild asterixis Psychological:  Alert and cooperative. Normal mood and affect  Assessment and Recommendations:  80. 78 year old male with cirrhosis / hepatocellular carcinoma. Receiving radioembolization. Patient's Xifaxan was recently discontinued by another provider, wife concerned about this. I do agree patient needs to  be back on the Xifaxan, he does have mild asterixis today. I restarted Xifaxan  2. C. difficile colitis. Patient was taking doxycycline for cellulitis concurrently with vancomycin suspension for C. difficile. Since completion of doxycycline over the weekend, patient's diarrhea has significantly improved. Recommend patient continue the remaining 2 days of vancomycin, LAD floor store twice daily for 30 days. Patient will contact us if diarrhea recurs.  3. recurrent dysphagia, both solids and liquids. Patient had dilation of esophageal stricture in December. His dysphasia has returned. In fact, swallowing problems recurred within a month or so of dilation. Dysphagia predates antibiotic use so candida esophagitis less likely. No candida seen on oral exam today. I don't know that patient would be a candidate for repeat dilation especially since he did not have a sustained response to the last dilation but will defer to Dr. Carlean Purl, patient's primary gastroenterologist

## 2013-05-15 NOTE — Progress Notes (Signed)
Patient ID: [name], male   DOB: Mar 28, 1935, 77 y.o.   MRN: [id number]    HPI Patient is seen today to followup his coronary disease. I saw him last September, 2014. At that time he was having arm pain. We need to be sure that it was not ischemia. Nuclear study was done. He could not be gated because of PACs. There was no scar or ischemia. Eventually it was determined that it was a problem related to carpal tunnel that could be improved with a brace on his arm. This is better. His cardiac status is stable. Unfortunately he has a multitude of other problems. He has severe cellulitis and then developed diarrhea from the antibiotics. This is improving. He also has a diagnosis of hepatocellular carcinoma. He will be receiving an advanced form of radiation therapy and possibly chemotherapy.  Allergies  Allergen Reactions  . Shellfish-Derived Products     Rash & angioedema  . Doxycycline Hyclate     Diarrhea   . Amoxicillin-Pot Clavulanate     Unsure what reaction was but pt tolerated penicillin 05/25/2012  . Aspirin     REACTION: unable to tolerate due to cirrhosis and varices  . Buprenorphine Hcl-Naloxone Hcl Other (See Comments)    unresponsive  . Codeine     Rash    . Ezetimibe     REACTION: LEG/JOINT PAIN  . Hydromorphone Hcl     unknown  . Meperidine Hcl     unknown  . Morphine     unknown  . Morphine Sulfate     REACTION: unspecified  . Oxycodone     hallucinations  . Sulfonamide Derivatives     Current Outpatient Prescriptions  Medication Sig Dispense Refill  . acetaminophen (OFIRMEV) 10 MG/ML SOLN Inject 1,000 mg into the vein once.      Marland Kitchen acetaminophen (TYLENOL) 500 MG tablet Take 1,000 mg by mouth every 6 (six) hours as needed for mild pain.       Marland Kitchen allopurinol (ZYLOPRIM) 300 MG tablet Take 150 mg by mouth daily.      . Ascorbic Acid (VITAMIN C) 500 MG tablet Take 500 mg by mouth daily.        . Calcium-Magnesium-Zinc (CALCIUM/MAGNESIUM/ZINC FORMULA) 1000-500-50 MG  TABS Take 1 tablet by mouth daily.        . fentaNYL (DURAGESIC - DOSED MCG/HR) 100 MCG/HR Place 100 mcg onto the skin every 3 (three) days.      . Ferrous Gluconate (IRON) 240 (27 FE) MG TABS Take 1 tablet by mouth 2 (two) times daily.       . furosemide (LASIX) 40 MG tablet Take 1 tablet (40 mg total) by mouth 2 (two) times daily.  180 tablet  1  . gabapentin (NEURONTIN) 300 MG capsule Take 1 capsule (300 mg total) by mouth 3 (three) times daily.  90 capsule  0  . glucose blood (FREESTYLE LITE) test strip 1 each by Other route 2 (two) times daily. And lancets: Dx code 250.01  100 each  5  . insulin aspart (NOVOLOG) 100 UNIT/ML injection Inject 10-40 Units into the skin 3 (three) times daily before meals. 20 units with breakfast, 10 units with lunch and 40 units with the evening meal.      . Insulin Syringe-Needle U-100 (INSULIN SYRINGE .5CC/31GX5/16") 31G X 5/16" 0.5 ML MISC Use one syringe 4 times daily. Dx code 250.01  120 each  6  . isosorbide mononitrate (IMDUR) 60 MG 24 hr tablet Take 60  mg by mouth daily.      Marland Kitchen lactulose (CHRONULAC) 10 GM/15ML solution Take 30 mLs (20 g total) by mouth 2 (two) times daily.  1892 mL  5  . loratadine (CLARITIN) 10 MG tablet Take 10 mg by mouth daily.      . metoprolol tartrate (LOPRESSOR) 25 MG tablet Take 0.5 tablets (12.5 mg total) by mouth 2 (two) times daily.  30 tablet  1  . nitroGLYCERIN (NITROSTAT) 0.4 MG SL tablet Place 1 tablet (0.4 mg total) under the tongue every 5 (five) minutes as needed for chest pain.  20 tablet  0  . omeprazole (PRILOSEC OTC) 20 MG tablet Take 20 mg by mouth daily.      . ondansetron (ZOFRAN) 8 MG tablet Take 1 tablet (8 mg total) by mouth every 12 (twelve) hours as needed for nausea or vomiting.  30 tablet  0  . polyethylene glycol (MIRALAX / GLYCOLAX) packet Take 17 g by mouth daily as needed. Take two or three times a week      . predniSONE (DELTASONE) 5 MG tablet Take 5 mg by mouth daily.       Marland Kitchen saccharomyces boulardii  (FLORASTOR) 250 MG capsule Take 1 capsule (250 mg total) by mouth 2 (two) times daily.  60 capsule  0  . spironolactone (ALDACTONE) 50 MG tablet Take 1 tablet (50 mg total) by mouth daily.  90 tablet  1  . Thiamine HCl (VITAMIN B-1) 250 MG tablet Take 125 mg by mouth daily.       . vancomycin (VANCOCIN) 50 mg/mL oral solution Take 2.5 mLs (125 mg total) by mouth 4 (four) times daily.  140 mL  0   No current facility-administered medications for this visit.    History   Social History  . Marital Status: Married    Spouse Name: N/A    Number of Children: N/A  . Years of Education: N/A   Occupational History  .      retired   Social History Main Topics  . Smoking status: Former Smoker -- 2.00 packs/day for 20 years    Types: Cigarettes    Quit date: 02/22/1975  . Smokeless tobacco: Never Used  . Alcohol Use: No     Comment: 06/21/2012 "none since Feb 2010"  . Drug Use: No  . Sexual Activity: No   Other Topics Concern  . Not on file   Social History Narrative   Patient does not get regular exercise   Quit smoking over 40 years ago    Family History  Problem Relation Age of Onset  . Alzheimer's disease Mother   . Cancer Father     GI (stomach?)  . Heart disease Sister     2 sisters  . Liver disease Sister   . Diabetes Brother   . Heart disease Brother     3 brothers  . Liver disease Brother   . Heart attack Other     sibling  . Melanoma Other     sibling  . Stroke Other     sibling  . Colon cancer Neg Hx     Past Medical History  Diagnosis Date  . Diverticulosis   . Gastric antral vascular ectasia   . Rhinophyma   . Melanoma     resected by [facility] 2008  . CAD (coronary artery disease)     myoview 2007 no ischemia/ no ASA because of GI disease  . Atrial fibrillation     amiodarone in  past, but problems and left off meds.  . Aortic stenosis     echo, September, 2010, mild aortic insufficiency  . Visual loss, one eye     right eye-ophthalmic arterial  branch occlusion  . Internal hemorrhoids   . GERD (gastroesophageal reflux disease)   . Hypertension   . Allergic rhinitis   . Anemia     multifactorial  . Cirrhosis, alcoholic   . Thrombocytopenia   . Bleeding esophageal varices   . Encephalopathy, unspecified   . Gout   . Short-segment Barrett's esophagus     suspected  . Venous insufficiency   . Ejection fraction     EF 55%, echo, September, 2010  . Carotid bruit     Dopplers okay in the past  . Skin cancer     Basal cell and squamous cell  . Cellulitis     recurrent to RLE/notes 06/21/2012  . CHF (congestive heart failure)   . Sinus bradycardia     January, 2014  . Diastolic CHF     January, 2014  . Complication of anesthesia     "quit breathing once a long time ago; dropped my BP too; it was after I was given Dilaudid" (06/21/2012)  . Heart murmur   . Anginal pain   . Shortness of breath     "stay that way most of the time now" (06/21/2012)  . OSA on CPAP   . Diabetes mellitus, type 2   . History of blood transfusion     "not related to OR" (06/21/2012)  . H/O hiatal hernia   . Osteoarthritis     "ALL OVER" (06/21/2012)  . Chronic lower back pain   . Aspiration into airway-silent on modified barium swallow 03/20/2013    Past Surgical History  Procedure Laterality Date  . Total knee arthroplasty Right   . Esophagogastroduodenoscopy  04/10/2008    esophageal varices, cardia varix  . Tips procedure  2/10  . Back surgery      X5 "for ruptured disc; last time did a fusion" (06/21/2012)  . Excisional hemorrhoidectomy    . Release scar contracture / graft repairs of hand      bilaterally   . Elbow surgery Right   . Hiatal hernia repair    . Colonoscopy w/ polypectomy  02/14/2007    adenomatous polyps, diverticulosis, internal hemorrhoids/rectal varices  . Cataract extraction, bilateral  January 2013  . Tonsillectomy    . Appendectomy    . Joint replacement    . Coronary artery bypass graft  1993    6 Bypass  .  Cataract extraction w/ intraocular lens  implant, bilateral      Patient Active Problem List   Diagnosis Date Noted  . Ejection fraction     Priority: High  . CAD (coronary artery disease)     Priority: High  . Atrial fibrillation     Priority: High  . CORONARY ARTERY BYPASS GRAFT, HX OF 11/12/2008    Priority: High  . HYPERLIPIDEMIA 12/04/2006    Priority: High  . HYPERTENSION, ESSENTIAL NOS 12/04/2006    Priority: High  . Hepatocellular carcinoma 05/15/2013  . Enteritis due to Clostridium difficile 05/10/2013  . Malnutrition of moderate degree 04/30/2013  . [place] (hepatocellular carcinoma) 04/25/2013  . Aspiration into airway-silent on modified barium swallow 03/20/2013  . Leg ulcer, left 03/20/2013  . Left arm pain 10/29/2012  . Dysphagia, unspecified(787.20) 10/09/2012  . Open wound of knee, leg (except thigh), and ankle, complicated 123456  .  Lymphedema of lower extremity 07/20/2012  . Venous stasis of lower extremity 07/20/2012  . DM (diabetes mellitus) with peripheral vascular complication 70/96/2836  . Cellulitis of right lower extremity 06/21/2012  . Congestive heart failure 05/25/2012  . Sinus bradycardia   . Aortic stenosis   . Diastolic CHF   . Edema 12/27/2011  . DM2 (diabetes mellitus, type 2) 08/01/2011  . Unspecified adverse effect of unspecified drug, medicinal and biological substance 06/22/2011  . Carotid bruit   . Paresthesia of foot 11/01/2010  . NEUROPATHY 04/29/2010  .  ASCITES 10/23/2009  . ARTERIAL BRANCH OCCLUSION OF RETINA 01/30/2009  . HYPERURICEMIA 12/17/2008  . LOW BACK PAIN, CHRONIC 08/29/2008  . CIRRHOSIS, ALCOHOLIC 62/94/7654  . Hepatic encephalopathy 05/07/2008  . COLONIC POLYPS, ADENOMATOUS, HX OF 03/19/2007  . THROMBOCYTOPENIA, CHRONIC 12/28/2006  . ALLERGIC RHINITIS 12/05/2006  . GERD 12/05/2006  . OSTEOARTHRITIS 12/05/2006  . SLEEP APNEA 12/05/2006  . GOUT NOS 12/04/2006  . ANEMIA, IRON DEFICIENCY 12/04/2006  . HYPERPLASIA,  PRST NOS W/O URINARY OBST/LUTS 12/04/2006    ROS   Patient denies fever, chills, headache, sweats, change in vision, change in hearing, chest pain, cough, urinary symptoms. All other systems are reviewed and are negative.  PHYSICAL EXAM  Patient is here with his wife. He is oriented to person time and place. Affect is normal. He has cut off his beard since I saw him last. There is no jugulovenous distention. Lungs are clear. Respiratory effort is nonlabored. Cardiac exam reveals S1 and S2. He has a crescendo decrescendo systolic murmur consistent with his aortic valvular disease. His abdomen is soft. He still has some redness in his leg from the prior cellulitis but there is no heat. There is also mild swelling in that leg.  Filed Vitals:   05/15/13 1047  BP: 138/52  Pulse: 60  Height: 6\' 3"  (1.905 m)  Weight: 257 lb 12.8 oz (116.937 kg)     ASSESSMENT & PLAN

## 2013-05-15 NOTE — Assessment & Plan Note (Signed)
His rhythm is regular. No further workup.

## 2013-05-15 NOTE — Assessment & Plan Note (Signed)
Blood pressure is controlled. No change in therapy. 

## 2013-05-16 ENCOUNTER — Other Ambulatory Visit: Payer: Medicare Other

## 2013-05-17 NOTE — Progress Notes (Signed)
Agree with Ms. Guenther's assessment and plan. Gatha Mayer, MD, [facility]  Cause of dysphagia not evident on last EGD. Will discuss at f/u

## 2013-05-20 ENCOUNTER — Telehealth: Payer: Self-pay | Admitting: *Deleted

## 2013-05-20 ENCOUNTER — Encounter: Payer: Self-pay | Admitting: *Deleted

## 2013-05-20 NOTE — Telephone Encounter (Signed)
Scheduled on 07/10/13 at 10:45 AM. Letter mailed to patient.

## 2013-05-20 NOTE — Telephone Encounter (Signed)
Message copied by Hulan Saas on Mon May 20, 2013 11:34 AM ------      Message from: Willia Craze      Created: Mon May 20, 2013 11:15 AM       Rollene Fare,      I saw patient for c-diff, he mentioned recurrent dysphagia. I didn't think Carlean Purl would have a lot to offer him at this point since last dilation didn't help. He will see patient in clinic and discuss further. Can you get patient appt with gessner. Thanks       ----- Message -----         From: Gatha Mayer, MD         Sent: 05/17/2013   9:23 PM           To: Willia Craze, NP                        ----- Message -----         From: Willia Craze, NP         Sent: 05/15/2013   3:14 PM           To: Gatha Mayer, MD                   ------

## 2013-05-26 ENCOUNTER — Encounter (HOSPITAL_COMMUNITY): Payer: Self-pay | Admitting: Emergency Medicine

## 2013-05-26 ENCOUNTER — Telehealth: Payer: Self-pay | Admitting: Gastroenterology

## 2013-05-26 ENCOUNTER — Inpatient Hospital Stay (HOSPITAL_COMMUNITY)
Admission: EM | Admit: 2013-05-26 | Discharge: 2013-05-30 | DRG: 603 | Disposition: A | Discharge status: Home / Self Care | Payer: Medicare Other | Attending: Internal Medicine | Admitting: Internal Medicine

## 2013-05-26 DIAGNOSIS — Z951 Presence of aortocoronary bypass graft: Secondary | ICD-10-CM

## 2013-05-26 DIAGNOSIS — Z888 Allergy status to other drugs, medicaments and biological substances status: Secondary | ICD-10-CM

## 2013-05-26 DIAGNOSIS — L97929 Non-pressure chronic ulcer of unspecified part of left lower leg with unspecified severity: Secondary | ICD-10-CM

## 2013-05-26 DIAGNOSIS — E440 Moderate protein-calorie malnutrition: Secondary | ICD-10-CM

## 2013-05-26 DIAGNOSIS — D689 Coagulation defect, unspecified: Secondary | ICD-10-CM | POA: Diagnosis present

## 2013-05-26 DIAGNOSIS — R1310 Dysphagia, unspecified: Secondary | ICD-10-CM

## 2013-05-26 DIAGNOSIS — K31819 Angiodysplasia of stomach and duodenum without bleeding: Secondary | ICD-10-CM | POA: Diagnosis present

## 2013-05-26 DIAGNOSIS — K2270 Barrett's esophagus without dysplasia: Secondary | ICD-10-CM | POA: Diagnosis present

## 2013-05-26 DIAGNOSIS — D6489 Other specified anemias: Secondary | ICD-10-CM | POA: Diagnosis present

## 2013-05-26 DIAGNOSIS — F1020 Alcohol dependence, uncomplicated: Secondary | ICD-10-CM | POA: Diagnosis present

## 2013-05-26 DIAGNOSIS — M545 Low back pain, unspecified: Secondary | ICD-10-CM

## 2013-05-26 DIAGNOSIS — R7989 Other specified abnormal findings of blood chemistry: Secondary | ICD-10-CM

## 2013-05-26 DIAGNOSIS — K7290 Hepatic failure, unspecified without coma: Secondary | ICD-10-CM

## 2013-05-26 DIAGNOSIS — I5032 Chronic diastolic (congestive) heart failure: Secondary | ICD-10-CM | POA: Diagnosis present

## 2013-05-26 DIAGNOSIS — R001 Bradycardia, unspecified: Secondary | ICD-10-CM

## 2013-05-26 DIAGNOSIS — Z86010 Personal history of colonic polyps: Secondary | ICD-10-CM

## 2013-05-26 DIAGNOSIS — M79602 Pain in left arm: Secondary | ICD-10-CM

## 2013-05-26 DIAGNOSIS — Z66 Do not resuscitate: Secondary | ICD-10-CM | POA: Diagnosis present

## 2013-05-26 DIAGNOSIS — I350 Nonrheumatic aortic (valve) stenosis: Secondary | ICD-10-CM

## 2013-05-26 DIAGNOSIS — I509 Heart failure, unspecified: Secondary | ICD-10-CM | POA: Diagnosis present

## 2013-05-26 DIAGNOSIS — I5030 Unspecified diastolic (congestive) heart failure: Secondary | ICD-10-CM

## 2013-05-26 DIAGNOSIS — E1151 Type 2 diabetes mellitus with diabetic peripheral angiopathy without gangrene: Secondary | ICD-10-CM | POA: Diagnosis present

## 2013-05-26 DIAGNOSIS — I428 Other cardiomyopathies: Secondary | ICD-10-CM | POA: Diagnosis present

## 2013-05-26 DIAGNOSIS — I8510 Secondary esophageal varices without bleeding: Secondary | ICD-10-CM | POA: Diagnosis present

## 2013-05-26 DIAGNOSIS — S91009A Unspecified open wound, unspecified ankle, initial encounter: Secondary | ICD-10-CM

## 2013-05-26 DIAGNOSIS — G4733 Obstructive sleep apnea (adult) (pediatric): Secondary | ICD-10-CM | POA: Diagnosis present

## 2013-05-26 DIAGNOSIS — M79609 Pain in unspecified limb: Secondary | ICD-10-CM

## 2013-05-26 DIAGNOSIS — Z79899 Other long term (current) drug therapy: Secondary | ICD-10-CM

## 2013-05-26 DIAGNOSIS — I4891 Unspecified atrial fibrillation: Secondary | ICD-10-CM

## 2013-05-26 DIAGNOSIS — I798 Other disorders of arteries, arterioles and capillaries in diseases classified elsewhere: Secondary | ICD-10-CM | POA: Diagnosis present

## 2013-05-26 DIAGNOSIS — Z794 Long term (current) use of insulin: Secondary | ICD-10-CM

## 2013-05-26 DIAGNOSIS — L0291 Cutaneous abscess, unspecified: Secondary | ICD-10-CM

## 2013-05-26 DIAGNOSIS — I359 Nonrheumatic aortic valve disorder, unspecified: Secondary | ICD-10-CM | POA: Diagnosis present

## 2013-05-26 DIAGNOSIS — C228 Malignant neoplasm of liver, primary, unspecified as to type: Secondary | ICD-10-CM | POA: Diagnosis present

## 2013-05-26 DIAGNOSIS — I2510 Atherosclerotic heart disease of native coronary artery without angina pectoris: Secondary | ICD-10-CM

## 2013-05-26 DIAGNOSIS — R9430 Abnormal result of cardiovascular function study, unspecified: Secondary | ICD-10-CM

## 2013-05-26 DIAGNOSIS — Z808 Family history of malignant neoplasm of other organs or systems: Secondary | ICD-10-CM

## 2013-05-26 DIAGNOSIS — Z833 Family history of diabetes mellitus: Secondary | ICD-10-CM

## 2013-05-26 DIAGNOSIS — T17908A Unspecified foreign body in respiratory tract, part unspecified causing other injury, initial encounter: Secondary | ICD-10-CM

## 2013-05-26 DIAGNOSIS — L03119 Cellulitis of unspecified part of limb: Principal | ICD-10-CM

## 2013-05-26 DIAGNOSIS — L02419 Cutaneous abscess of limb, unspecified: Principal | ICD-10-CM | POA: Diagnosis present

## 2013-05-26 DIAGNOSIS — R202 Paresthesia of skin: Secondary | ICD-10-CM

## 2013-05-26 DIAGNOSIS — Z820 Family history of epilepsy and other diseases of the nervous system: Secondary | ICD-10-CM

## 2013-05-26 DIAGNOSIS — N400 Enlarged prostate without lower urinary tract symptoms: Secondary | ICD-10-CM

## 2013-05-26 DIAGNOSIS — K7682 Hepatic encephalopathy: Secondary | ICD-10-CM

## 2013-05-26 DIAGNOSIS — A0472 Enterocolitis due to Clostridium difficile, not specified as recurrent: Secondary | ICD-10-CM

## 2013-05-26 DIAGNOSIS — G4730 Sleep apnea, unspecified: Secondary | ICD-10-CM

## 2013-05-26 DIAGNOSIS — I878 Other specified disorders of veins: Secondary | ICD-10-CM

## 2013-05-26 DIAGNOSIS — J309 Allergic rhinitis, unspecified: Secondary | ICD-10-CM

## 2013-05-26 DIAGNOSIS — L03115 Cellulitis of right lower limb: Secondary | ICD-10-CM

## 2013-05-26 DIAGNOSIS — D696 Thrombocytopenia, unspecified: Secondary | ICD-10-CM | POA: Diagnosis present

## 2013-05-26 DIAGNOSIS — K7030 Alcoholic cirrhosis of liver without ascites: Secondary | ICD-10-CM | POA: Diagnosis present

## 2013-05-26 DIAGNOSIS — K219 Gastro-esophageal reflux disease without esophagitis: Secondary | ICD-10-CM | POA: Diagnosis present

## 2013-05-26 DIAGNOSIS — M1990 Unspecified osteoarthritis, unspecified site: Secondary | ICD-10-CM

## 2013-05-26 DIAGNOSIS — Z87891 Personal history of nicotine dependence: Secondary | ICD-10-CM

## 2013-05-26 DIAGNOSIS — H5460 Unqualified visual loss, one eye, unspecified: Secondary | ICD-10-CM | POA: Diagnosis present

## 2013-05-26 DIAGNOSIS — Z823 Family history of stroke: Secondary | ICD-10-CM

## 2013-05-26 DIAGNOSIS — R0989 Other specified symptoms and signs involving the circulatory and respiratory systems: Secondary | ICD-10-CM

## 2013-05-26 DIAGNOSIS — D509 Iron deficiency anemia, unspecified: Secondary | ICD-10-CM

## 2013-05-26 DIAGNOSIS — M7989 Other specified soft tissue disorders: Secondary | ICD-10-CM

## 2013-05-26 DIAGNOSIS — L0390 Cellulitis, unspecified: Secondary | ICD-10-CM | POA: Diagnosis present

## 2013-05-26 DIAGNOSIS — I890 Lymphedema, not elsewhere classified: Secondary | ICD-10-CM

## 2013-05-26 DIAGNOSIS — R609 Edema, unspecified: Secondary | ICD-10-CM

## 2013-05-26 DIAGNOSIS — E1159 Type 2 diabetes mellitus with other circulatory complications: Secondary | ICD-10-CM | POA: Diagnosis present

## 2013-05-26 DIAGNOSIS — Z8249 Family history of ischemic heart disease and other diseases of the circulatory system: Secondary | ICD-10-CM

## 2013-05-26 DIAGNOSIS — T887XXA Unspecified adverse effect of drug or medicament, initial encounter: Secondary | ICD-10-CM

## 2013-05-26 DIAGNOSIS — H34239 Retinal artery branch occlusion, unspecified eye: Secondary | ICD-10-CM

## 2013-05-26 DIAGNOSIS — S81809A Unspecified open wound, unspecified lower leg, initial encounter: Secondary | ICD-10-CM

## 2013-05-26 DIAGNOSIS — E1169 Type 2 diabetes mellitus with other specified complication: Secondary | ICD-10-CM | POA: Diagnosis present

## 2013-05-26 DIAGNOSIS — E119 Type 2 diabetes mellitus without complications: Secondary | ICD-10-CM

## 2013-05-26 DIAGNOSIS — I10 Essential (primary) hypertension: Secondary | ICD-10-CM | POA: Diagnosis present

## 2013-05-26 DIAGNOSIS — M109 Gout, unspecified: Secondary | ICD-10-CM | POA: Diagnosis present

## 2013-05-26 DIAGNOSIS — C220 Liver cell carcinoma: Secondary | ICD-10-CM

## 2013-05-26 DIAGNOSIS — I2789 Other specified pulmonary heart diseases: Secondary | ICD-10-CM | POA: Diagnosis present

## 2013-05-26 DIAGNOSIS — G589 Mononeuropathy, unspecified: Secondary | ICD-10-CM

## 2013-05-26 DIAGNOSIS — E782 Mixed hyperlipidemia: Secondary | ICD-10-CM

## 2013-05-26 DIAGNOSIS — I872 Venous insufficiency (chronic) (peripheral): Secondary | ICD-10-CM | POA: Diagnosis present

## 2013-05-26 DIAGNOSIS — R188 Other ascites: Secondary | ICD-10-CM

## 2013-05-26 DIAGNOSIS — S81009A Unspecified open wound, unspecified knee, initial encounter: Secondary | ICD-10-CM

## 2013-05-26 LAB — COMPREHENSIVE METABOLIC PANEL
ALBUMIN: 3.3 g/dL — AB (ref 3.5–5.2)
ALK PHOS: 57 U/L (ref 39–117)
ALT: 10 U/L (ref 0–53)
AST: 20 U/L (ref 0–37)
BILIRUBIN TOTAL: 1.4 mg/dL — AB (ref 0.3–1.2)
BUN: 21 mg/dL (ref 6–23)
CO2: 28 mEq/L (ref 19–32)
Calcium: 9.3 mg/dL (ref 8.4–10.5)
Chloride: 100 mEq/L (ref 96–112)
Creatinine, Ser: 1.18 mg/dL (ref 0.50–1.35)
GFR calc Af Amer: 68 mL/min — ABNORMAL LOW (ref >90)
GFR calc non Af Amer: 59 mL/min — ABNORMAL LOW (ref >90)
Glucose, Bld: 185 mg/dL — ABNORMAL HIGH (ref 70–99)
POTASSIUM: 4.1 meq/L (ref 3.7–5.3)
SODIUM: 140 meq/L (ref 137–147)
Total Protein: 6.4 g/dL (ref 6.0–8.3)

## 2013-05-26 LAB — I-STAT TROPONIN, ED: TROPONIN I, POC: 0.01 ng/mL (ref 0.00–0.08)

## 2013-05-26 LAB — CBC WITH DIFFERENTIAL/PLATELET
BASOS PCT: 0 % (ref 0–1)
Basophils Absolute: 0 10*3/uL (ref 0.0–0.1)
Eosinophils Absolute: 0 10*3/uL (ref 0.0–0.7)
Eosinophils Relative: 0 % (ref 0–5)
HCT: 32.6 % — ABNORMAL LOW (ref 39.0–52.0)
Hemoglobin: 10.7 g/dL — ABNORMAL LOW (ref 13.0–17.0)
Lymphocytes Relative: 14 % (ref 12–46)
Lymphs Abs: 0.7 10*3/uL (ref 0.7–4.0)
MCH: 32.4 pg (ref 26.0–34.0)
MCHC: 32.8 g/dL (ref 30.0–36.0)
MCV: 98.8 fL (ref 78.0–100.0)
Monocytes Absolute: 0.5 10*3/uL (ref 0.1–1.0)
Monocytes Relative: 10 % (ref 3–12)
NEUTROS ABS: 3.8 10*3/uL (ref 1.7–7.7)
NEUTROS PCT: 76 % (ref 43–77)
Platelets: 37 10*3/uL — ABNORMAL LOW (ref 150–400)
RBC: 3.3 MIL/uL — AB (ref 4.22–5.81)
RDW: 14.7 % (ref 11.5–15.5)
WBC: 5 10*3/uL (ref 4.0–10.5)

## 2013-05-26 LAB — GLUCOSE, CAPILLARY
GLUCOSE-CAPILLARY: 233 mg/dL — AB (ref 70–99)
GLUCOSE-CAPILLARY: 274 mg/dL — AB (ref 70–99)

## 2013-05-26 LAB — PROTIME-INR
INR: 1.41 (ref 0.00–1.49)
Prothrombin Time: 16.9 seconds — ABNORMAL HIGH (ref 11.6–15.2)

## 2013-05-26 LAB — AMMONIA: Ammonia: 29 umol/L (ref 11–60)

## 2013-05-26 MED ORDER — IRON 240 (27 FE) MG PO TABS: 1.0000 | ORAL_TABLET | Freq: Two times a day (BID) | ORAL | Status: DC

## 2013-05-26 MED ORDER — VANCOMYCIN HCL IN DEXTROSE 1-5 GM/200ML-% IV SOLN
1000.0000 mg | Freq: Once | INTRAVENOUS | Status: AC
  Administered 2013-05-26: 1000 mg via INTRAVENOUS
  Filled 2013-05-26: qty 200

## 2013-05-26 MED ORDER — FERROUS GLUCONATE 324 (38 FE) MG PO TABS
324.0000 mg | ORAL_TABLET | Freq: Two times a day (BID) | ORAL | Status: DC
  Administered 2013-05-26 – 2013-05-30 (×8): 324 mg via ORAL
  Filled 2013-05-26 (×10): qty 1

## 2013-05-26 MED ORDER — CIPROFLOXACIN IN D5W 400 MG/200ML IV SOLN
400.0000 mg | Freq: Once | INTRAVENOUS | Status: AC
  Administered 2013-05-26: 400 mg via INTRAVENOUS
  Filled 2013-05-26: qty 200

## 2013-05-26 MED ORDER — SODIUM CHLORIDE 0.9 % IV SOLN
1500.0000 mg | Freq: Once | INTRAVENOUS | Status: AC
  Administered 2013-05-26: 1500 mg via INTRAVENOUS
  Filled 2013-05-26: qty 1500

## 2013-05-26 MED ORDER — VITAMIN C 500 MG PO TABS
500.0000 mg | ORAL_TABLET | Freq: Every day | ORAL | Status: DC
  Administered 2013-05-26 – 2013-05-30 (×5): 500 mg via ORAL
  Filled 2013-05-26 (×5): qty 1

## 2013-05-26 MED ORDER — GABAPENTIN 300 MG PO CAPS
300.0000 mg | ORAL_CAPSULE | Freq: Three times a day (TID) | ORAL | Status: DC
  Administered 2013-05-26 – 2013-05-30 (×12): 300 mg via ORAL
  Filled 2013-05-26 (×14): qty 1

## 2013-05-26 MED ORDER — ONDANSETRON HCL 4 MG/2ML IJ SOLN: 4.0000 mg | Freq: Four times a day (QID) | INTRAMUSCULAR | Status: DC | PRN

## 2013-05-26 MED ORDER — PANTOPRAZOLE SODIUM 40 MG PO TBEC
40.0000 mg | DELAYED_RELEASE_TABLET | Freq: Every day | ORAL | Status: DC
  Administered 2013-05-26 – 2013-05-28 (×3): 40 mg via ORAL
  Filled 2013-05-26 (×3): qty 1

## 2013-05-26 MED ORDER — SACCHAROMYCES BOULARDII 250 MG PO CAPS
250.0000 mg | ORAL_CAPSULE | Freq: Two times a day (BID) | ORAL | Status: DC
  Administered 2013-05-26 – 2013-05-30 (×8): 250 mg via ORAL
  Filled 2013-05-26 (×9): qty 1

## 2013-05-26 MED ORDER — PIPERACILLIN-TAZOBACTAM 3.375 G IVPB
3.3750 g | Freq: Three times a day (TID) | INTRAVENOUS | Status: DC
  Administered 2013-05-26 – 2013-05-28 (×8): 3.375 g via INTRAVENOUS
  Filled 2013-05-26 (×11): qty 50

## 2013-05-26 MED ORDER — SODIUM CHLORIDE 0.9 % IV SOLN: INTRAVENOUS | Status: DC

## 2013-05-26 MED ORDER — VANCOMYCIN HCL 10 G IV SOLR
1500.0000 mg | INTRAVENOUS | Status: DC
  Administered 2013-05-27: 1500 mg via INTRAVENOUS
  Filled 2013-05-26 (×2): qty 1500

## 2013-05-26 MED ORDER — ISOSORBIDE MONONITRATE ER 60 MG PO TB24
60.0000 mg | ORAL_TABLET | Freq: Every morning | ORAL | Status: DC
  Administered 2013-05-26 – 2013-05-30 (×5): 60 mg via ORAL
  Filled 2013-05-26 (×5): qty 1

## 2013-05-26 MED ORDER — FUROSEMIDE 40 MG PO TABS
40.0000 mg | ORAL_TABLET | Freq: Two times a day (BID) | ORAL | Status: DC
  Administered 2013-05-26 – 2013-05-30 (×8): 40 mg via ORAL
  Filled 2013-05-26 (×11): qty 1

## 2013-05-26 MED ORDER — INSULIN GLARGINE 100 UNIT/ML ~~LOC~~ SOLN
5.0000 [IU] | SUBCUTANEOUS | Status: DC
  Administered 2013-05-26: 5 [IU] via SUBCUTANEOUS
  Filled 2013-05-26 (×2): qty 0.05

## 2013-05-26 MED ORDER — OMEPRAZOLE MAGNESIUM 20 MG PO TBEC: 20.0000 mg | DELAYED_RELEASE_TABLET | Freq: Every day | ORAL | Status: DC

## 2013-05-26 MED ORDER — LACTULOSE 10 GM/15ML PO SOLN
20.0000 g | Freq: Two times a day (BID) | ORAL | Status: DC
  Administered 2013-05-26 – 2013-05-27 (×3): 20 g via ORAL
  Filled 2013-05-26 (×5): qty 30

## 2013-05-26 MED ORDER — ALLOPURINOL 150 MG HALF TABLET
150.0000 mg | ORAL_TABLET | Freq: Every morning | ORAL | Status: DC
  Administered 2013-05-26 – 2013-05-30 (×5): 150 mg via ORAL
  Filled 2013-05-26 (×5): qty 1

## 2013-05-26 MED ORDER — FENTANYL 100 MCG/HR TD PT72
100.0000 ug | MEDICATED_PATCH | TRANSDERMAL | Status: DC
  Administered 2013-05-26 – 2013-05-29 (×2): 100 ug via TRANSDERMAL
  Filled 2013-05-26 (×2): qty 1

## 2013-05-26 MED ORDER — LORATADINE 10 MG PO TABS
10.0000 mg | ORAL_TABLET | Freq: Every morning | ORAL | Status: DC
  Administered 2013-05-26 – 2013-05-30 (×5): 10 mg via ORAL
  Filled 2013-05-26 (×5): qty 1

## 2013-05-26 MED ORDER — ONDANSETRON HCL 4 MG PO TABS: 4.0000 mg | ORAL_TABLET | Freq: Four times a day (QID) | ORAL | Status: DC | PRN

## 2013-05-26 MED ORDER — INSULIN ASPART 100 UNIT/ML ~~LOC~~ SOLN
0.0000 [IU] | Freq: Three times a day (TID) | SUBCUTANEOUS | Status: DC
  Administered 2013-05-26: 3 [IU] via SUBCUTANEOUS
  Administered 2013-05-27: 5 [IU] via SUBCUTANEOUS
  Administered 2013-05-27: 2 [IU] via SUBCUTANEOUS
  Administered 2013-05-27: 5 [IU] via SUBCUTANEOUS
  Administered 2013-05-28: 2 [IU] via SUBCUTANEOUS
  Administered 2013-05-28 (×2): 5 [IU] via SUBCUTANEOUS
  Administered 2013-05-29: 2 [IU] via SUBCUTANEOUS
  Administered 2013-05-29: 5 [IU] via SUBCUTANEOUS
  Administered 2013-05-30: 7 [IU] via SUBCUTANEOUS
  Administered 2013-05-30: 5 [IU] via SUBCUTANEOUS

## 2013-05-26 MED ORDER — METOPROLOL TARTRATE 12.5 MG HALF TABLET
12.5000 mg | ORAL_TABLET | Freq: Two times a day (BID) | ORAL | Status: DC
  Administered 2013-05-26 – 2013-05-30 (×8): 12.5 mg via ORAL
  Filled 2013-05-26 (×9): qty 1

## 2013-05-26 MED ORDER — RIFAXIMIN 550 MG PO TABS
550.0000 mg | ORAL_TABLET | Freq: Two times a day (BID) | ORAL | Status: DC
  Administered 2013-05-26 – 2013-05-30 (×8): 550 mg via ORAL
  Filled 2013-05-26 (×9): qty 1

## 2013-05-26 MED ORDER — VANCOMYCIN 50 MG/ML ORAL SOLUTION
125.0000 mg | Freq: Four times a day (QID) | ORAL | Status: DC
  Administered 2013-05-26 – 2013-05-29 (×11): 125 mg via ORAL
  Filled 2013-05-26 (×17): qty 2.5

## 2013-05-26 MED ORDER — VANCOMYCIN HCL 10 G IV SOLR
2500.0000 mg | Freq: Once | INTRAVENOUS | Status: DC
  Filled 2013-05-26: qty 2500

## 2013-05-26 MED ORDER — SPIRONOLACTONE 50 MG PO TABS
50.0000 mg | ORAL_TABLET | Freq: Every day | ORAL | Status: DC
  Administered 2013-05-26 – 2013-05-30 (×5): 50 mg via ORAL
  Filled 2013-05-26 (×5): qty 1

## 2013-05-26 NOTE — Progress Notes (Signed)
Patient arrived to floor at this time.Pleasant. Alert and orientedx4. Placed comfortably in bed.- Sandie Ano RN

## 2013-05-26 NOTE — Telephone Encounter (Signed)
H/o cellulitis in right leg, just completed abx recently for it.  Same leg is red, warm again.  I advised he call is PCP.

## 2013-05-26 NOTE — Progress Notes (Signed)
VASCULAR LAB PRELIMINARY  PRELIMINARY  PRELIMINARY  PRELIMINARY  Right lower extremity venous duplex completed.    Preliminary report:  Right:  No evidence of DVT, superficial thrombosis, or Baker's cyst. No change since study of 04/2013  Toma Copier, RVS 05/26/2013, 12:49 PM

## 2013-05-26 NOTE — ED Provider Notes (Signed)
CSN: [id number]     Arrival date & time 05/26/13  1046 History   First MD Initiated Contact with Patient 05/26/13 1120     Chief Complaint  Patient presents with  . Cellulitis     (Consider location/radiation/quality/duration/timing/severity/associated sxs/prior Treatment) HPI Comments: Presents with redness, swelling and pain to his right lower leg for the past 3 days. He's had multiple hospitalizations for cellulitis of the same leg in the past several years. He was admitted one month ago for the same. He completed a course of doxycycline as well as by mouth vancomycin for concurrent C. difficile infection. Denies any diarrhea or abdominal pain. No nausea or vomiting. Felt warm at home checked his temperature. He is on suppressive penicillin therapy at home. Good by mouth intake and urine output. Mental status normal. Wife states last time he was much more confused and they're trying to catch the infection early this time.  The history is provided by the patient.    Past Medical History  Diagnosis Date  . Diverticulosis   . Gastric antral vascular ectasia   . Rhinophyma   . Melanoma     resected by [facility] 2008  . CAD (coronary artery disease)     myoview 2007 no ischemia/ no ASA because of GI disease  . Atrial fibrillation     amiodarone in past, but problems and left off meds.  . Aortic stenosis     echo, September, 2010, mild aortic insufficiency  . Visual loss, one eye     right eye-ophthalmic arterial branch occlusion  . Internal hemorrhoids   . GERD (gastroesophageal reflux disease)   . Hypertension   . Allergic rhinitis   . Anemia     multifactorial  . Cirrhosis, alcoholic   . Thrombocytopenia   . Bleeding esophageal varices   . Encephalopathy, unspecified   . Gout   . Short-segment Barrett's esophagus     suspected  . Venous insufficiency   . Ejection fraction     EF 55%, echo, September, 2010  . Carotid bruit     Dopplers okay in the past  . Skin cancer      Basal cell and squamous cell  . Cellulitis     recurrent to RLE/notes 06/21/2012  . CHF (congestive heart failure)   . Sinus bradycardia     January, 2014  . Diastolic CHF     January, 2014  . Complication of anesthesia     "quit breathing once a long time ago; dropped my BP too; it was after I was given Dilaudid" (06/21/2012)  . Heart murmur   . Anginal pain   . Shortness of breath     "stay that way most of the time now" (06/21/2012)  . OSA on CPAP   . Diabetes mellitus, type 2   . History of blood transfusion     "not related to OR" (06/21/2012)  . H/O hiatal hernia   . Osteoarthritis     "ALL OVER" (06/21/2012)  . Chronic lower back pain   . Aspiration into airway-silent on modified barium swallow 03/20/2013   Past Surgical History  Procedure Laterality Date  . Total knee arthroplasty Right   . Esophagogastroduodenoscopy  04/10/2008    esophageal varices, cardia varix  . Tips procedure  2/10  . Back surgery      X5 "for ruptured disc; last time did a fusion" (06/21/2012)  . Excisional hemorrhoidectomy    . Release scar contracture / graft repairs of hand  bilaterally   . Elbow surgery Right   . Hiatal hernia repair    . Colonoscopy w/ polypectomy  02/14/2007    adenomatous polyps, diverticulosis, internal hemorrhoids/rectal varices  . Cataract extraction, bilateral  January 2013  . Tonsillectomy    . Appendectomy    . Joint replacement    . Coronary artery bypass graft  1993    6 Bypass  . Cataract extraction w/ intraocular lens  implant, bilateral     Family History  Problem Relation Age of Onset  . Alzheimer's disease Mother   . Cancer Father     GI (stomach?)  . Heart disease Sister     2 sisters  . Liver disease Sister   . Diabetes Brother   . Heart disease Brother     3 brothers  . Liver disease Brother   . Heart attack Other     sibling  . Melanoma Other     sibling  . Stroke Other     sibling  . Colon cancer Neg Hx    History  Substance Use Topics   . Smoking status: Former Smoker -- 2.00 packs/day for 20 years    Types: Cigarettes    Quit date: 02/22/1975  . Smokeless tobacco: Never Used  . Alcohol Use: No     Comment: 06/21/2012 "none since Feb 2010"    Review of Systems  Constitutional: Negative for fever, activity change and appetite change.  Respiratory: Negative for cough, chest tightness and shortness of breath.   Cardiovascular: Positive for leg swelling. Negative for chest pain.  Gastrointestinal: Negative for nausea, vomiting and abdominal pain.  Genitourinary: Negative for dysuria and hematuria.  Musculoskeletal: Negative for arthralgias and myalgias.  Skin: Positive for rash.  Neurological: Negative for dizziness, weakness and headaches.  A complete 10 system review of systems was obtained and all systems are negative except as noted in the HPI and PMH.      Allergies  Shellfish-derived products; Doxycycline hyclate; Amoxicillin-pot clavulanate; Aspirin; Buprenorphine hcl-naloxone hcl; Codeine; Ezetimibe; Hydromorphone hcl; Meperidine hcl; Morphine; Morphine sulfate; Oxycodone; and Sulfonamide derivatives  Home Medications   No current outpatient prescriptions on file. BP 145/59  Pulse 66  Temp(Src) 98.2 F (36.8 C) (Oral)  Resp 18  Ht 6\' 3"  (1.905 m)  Wt 245 lb 9.6 oz (111.403 kg)  BMI 30.70 kg/m2  SpO2 99% Physical Exam  Constitutional: He is oriented to person, place, and time. He appears well-developed and well-nourished. No distress.  HENT:  Head: Normocephalic and atraumatic.  Mouth/Throat: Oropharynx is clear and moist. No oropharyngeal exudate.  Eyes: Conjunctivae and EOM are normal. Pupils are equal, round, and reactive to light.  Neck: Normal range of motion. Neck supple.  Cardiovascular: Normal rate, regular rhythm and normal heart sounds.   Pulmonary/Chest: Effort normal and breath sounds normal. No respiratory distress.  Abdominal: Soft. There is no tenderness. There is no rebound and no  guarding.  Musculoskeletal: Normal range of motion. He exhibits edema and tenderness.  Asymmetric swelling of the right lower extremity Erythema and tenderness. Distal DP pulses intact.  Neurological: He is alert and oriented to person, place, and time. No cranial nerve deficit. He exhibits normal muscle tone. Coordination normal.  Skin: Skin is warm.    ED Course  Procedures (including critical care time) Labs Review Labs Reviewed  CBC WITH DIFFERENTIAL - Abnormal; Notable for the following:    RBC 3.30 (*)    Hemoglobin 10.7 (*)    HCT 32.6 (*)  Platelets 37 (*)    All other components within normal limits  COMPREHENSIVE METABOLIC PANEL - Abnormal; Notable for the following:    Glucose, Bld 185 (*)    Albumin 3.3 (*)    Total Bilirubin 1.4 (*)    GFR calc non Af Amer 59 (*)    GFR calc Af Amer 68 (*)    All other components within normal limits  PROTIME-INR - Abnormal; Notable for the following:    Prothrombin Time 16.9 (*)    All other components within normal limits  GLUCOSE, CAPILLARY - Abnormal; Notable for the following:    Glucose-Capillary 233 (*)    All other components within normal limits  CULTURE, BLOOD (ROUTINE X 2)  CULTURE, BLOOD (ROUTINE X 2)  AMMONIA  I-STAT CG4 LACTIC ACID, ED  I-STAT TROPOININ, ED   Imaging Review No results found.   EKG Interpretation None      MDM   Final diagnoses:  Cellulitis   Recurrent cellulitis of right lower leg. No fever or vomiting at home. Distal pulses intact.  No fever.  WBC wnl.  Extensive erythema to RLE to mid thigh.  Doppler negative for DVT. Antibiotics started, vanco and cipro.  Patient had C dif last time on IV abx but completed PO vancomycin and diarrhea improved.  No abdominal pain, vomiting, diarrhea. Thrombocytopenia at baseline. Admission dw Dr. Daleen Bo.    Ezequiel Essex, MD 05/26/13 1728

## 2013-05-26 NOTE — ED Notes (Signed)
Lab in to draw blood work

## 2013-05-26 NOTE — ED Notes (Signed)
Pt c/o cellulitis of rt leg off and on x 5-6 years.  States that he had his saphenous vein removed about 15 years ago in that leg. Has been hospitalized 5 times for same cellulitis of same leg in the past few years.  Most recently was in the hospital in March for same.  Leg is hot to touch and swollen.

## 2013-05-26 NOTE — H&P (Signed)
[facility] History and Physical  Eugene R Manuelito KM:[id number] DOB: [birth year] DOA: 05/26/2013  Referring physician:  PCP: Unice Cobble, MD  Specialists:   Chief Complaint: R leg redness  HPI: [name] is a 78 y.o. male with PMH of HTN, CAD s/p CABG, IDDM, CHF, HCC, alcoholic liver cirrhosis, recurrent R leg cellulitis presented with worsening R leg erythema, edema, pain for several days; patient had recurrent R leg cellulitis with associated intermittent sepsis;  last time he was admitted with cellulitis on 3/09 treated with IV atx later complicated with c diff requiring PO vanc for 14 days and discharged home 3./15/15; patient improved but for the last several days noticed again worsening R cellulitis, associate with chills, pain, but no fever;     ED started IV atx, ordered blood cultures and hospiatlist called for evaluation   Review of Systems: The patient denies anorexia, fever, weight loss,, vision loss, decreased hearing, hoarseness, chest pain, syncope, dyspnea on exertion, peripheral edema, balance deficits, hemoptysis, abdominal pain, melena, hematochezia, severe indigestion/heartburn, hematuria, incontinence, genital sores, muscle weakness, suspicious skin lesions, transient blindness, difficulty walking, depression, unusual weight change, abnormal bleeding, enlarged lymph nodes, angioedema, and breast masses.    Past Medical History  Diagnosis Date  . Diverticulosis   . Gastric antral vascular ectasia   . Rhinophyma   . Melanoma     resected by [facility] 2008  . CAD (coronary artery disease)     myoview 2007 no ischemia/ no ASA because of GI disease  . Atrial fibrillation     amiodarone in past, but problems and left off meds.  . Aortic stenosis     echo, September, 2010, mild aortic insufficiency  . Visual loss, one eye     right eye-ophthalmic arterial branch occlusion  . Internal hemorrhoids   . GERD (gastroesophageal reflux disease)   . Hypertension   . Allergic  rhinitis   . Anemia     multifactorial  . Cirrhosis, alcoholic   . Thrombocytopenia   . Bleeding esophageal varices   . Encephalopathy, unspecified   . Gout   . Short-segment Barrett's esophagus     suspected  . Venous insufficiency   . Ejection fraction     EF 55%, echo, September, 2010  . Carotid bruit     Dopplers okay in the past  . Skin cancer     Basal cell and squamous cell  . Cellulitis     recurrent to RLE/notes 06/21/2012  . CHF (congestive heart failure)   . Sinus bradycardia     January, 2014  . Diastolic CHF     January, 2014  . Complication of anesthesia     "quit breathing once a long time ago; dropped my BP too; it was after I was given Dilaudid" (06/21/2012)  . Heart murmur   . Anginal pain   . Shortness of breath     "stay that way most of the time now" (06/21/2012)  . OSA on CPAP   . Diabetes mellitus, type 2   . History of blood transfusion     "not related to OR" (06/21/2012)  . H/O hiatal hernia   . Osteoarthritis     "ALL OVER" (06/21/2012)  . Chronic lower back pain   . Aspiration into airway-silent on modified barium swallow 03/20/2013   Past Surgical History  Procedure Laterality Date  . Total knee arthroplasty Right   . Esophagogastroduodenoscopy  04/10/2008    esophageal varices, cardia varix  . Tips procedure  2/10  . Back surgery      X5 "for ruptured disc; last time did a fusion" (06/21/2012)  . Excisional hemorrhoidectomy    . Release scar contracture / graft repairs of hand      bilaterally   . Elbow surgery Right   . Hiatal hernia repair    . Colonoscopy w/ polypectomy  02/14/2007    adenomatous polyps, diverticulosis, internal hemorrhoids/rectal varices  . Cataract extraction, bilateral  January 2013  . Tonsillectomy    . Appendectomy    . Joint replacement    . Coronary artery bypass graft  1993    6 Bypass  . Cataract extraction w/ intraocular lens  implant, bilateral     Social History:  reports that he quit smoking about 38 years  ago. His smoking use included Cigarettes. He has a 40 pack-year smoking history. He has never used smokeless tobacco. He reports that he does not drink alcohol or use illicit drugs. Home;  where does patient live--home, ALF, SNF? and with whom if at home? Yes;  Can patient participate in ADLs?  Allergies  Allergen Reactions  . Shellfish-Derived Products     Rash & angioedema  . Doxycycline Hyclate     Diarrhea   . Amoxicillin-Pot Clavulanate     Unsure what reaction was but pt tolerated penicillin 05/25/2012  . Aspirin     REACTION: unable to tolerate due to cirrhosis and varices  . Buprenorphine Hcl-Naloxone Hcl Other (See Comments)    unresponsive  . Codeine     Rash    . Ezetimibe     REACTION: LEG/JOINT PAIN  . Hydromorphone Hcl     unknown  . Meperidine Hcl     unknown  . Morphine     unknown  . Morphine Sulfate     REACTION: unspecified  . Oxycodone     hallucinations  . Sulfonamide Derivatives     Family History  Problem Relation Age of Onset  . Alzheimer's disease Mother   . Cancer Father     GI (stomach?)  . Heart disease Sister     2 sisters  . Liver disease Sister   . Diabetes Brother   . Heart disease Brother     3 brothers  . Liver disease Brother   . Heart attack Other     sibling  . Melanoma Other     sibling  . Stroke Other     sibling  . Colon cancer Neg Hx     (be sure to complete)  Prior to Admission medications   Medication Sig Start Date End Date Taking? Authorizing Provider  acetaminophen (TYLENOL) 500 MG tablet Take 1,000 mg by mouth every 6 (six) hours as needed for mild pain.    Yes Historical Provider, MD  allopurinol (ZYLOPRIM) 300 MG tablet Take 150 mg by mouth every morning.    Yes Historical Provider, MD  Ascorbic Acid (VITAMIN C) 500 MG tablet Take 500 mg by mouth daily.     Yes Historical Provider, MD  Calcium-Magnesium-Zinc (CALCIUM/MAGNESIUM/ZINC FORMULA) 1000-500-50 MG TABS Take 1 tablet by mouth every morning.    Yes  Historical Provider, MD  fentaNYL (DURAGESIC - DOSED MCG/HR) 100 MCG/HR Place 100 mcg onto the skin every 3 (three) days.   Yes Historical Provider, MD  Ferrous Gluconate (IRON) 240 (27 FE) MG TABS Take 1 tablet by mouth 2 (two) times daily.    Yes Historical Provider, MD  furosemide (LASIX) 40 MG tablet Take 1 tablet (  40 mg total) by mouth 2 (two) times daily. 03/29/13  Yes Gatha Mayer, MD  gabapentin (NEURONTIN) 300 MG capsule Take 1 capsule (300 mg total) by mouth 3 (three) times daily. 04/22/13  Yes Hendricks Limes, MD  insulin aspart (NOVOLOG) 100 UNIT/ML injection Inject 10-40 Units into the skin 3 (three) times daily before meals. 20 units with breakfast, 10 units with lunch and 40 units with the evening meal. 09/06/12  Yes Renato Shin, MD  isosorbide mononitrate (IMDUR) 60 MG 24 hr tablet Take 60 mg by mouth every morning.    Yes Historical Provider, MD  lactulose (CHRONULAC) 10 GM/15ML solution Take 30 mLs (20 g total) by mouth 2 (two) times daily. 03/29/13  Yes Gatha Mayer, MD  loratadine (CLARITIN) 10 MG tablet Take 10 mg by mouth every morning.    Yes Historical Provider, MD  metoprolol tartrate (LOPRESSOR) 25 MG tablet Take 0.5 tablets (12.5 mg total) by mouth 2 (two) times daily. 04/22/13  Yes Hendricks Limes, MD  nitroGLYCERIN (NITROSTAT) 0.4 MG SL tablet Place 1 tablet (0.4 mg total) under the tongue every 5 (five) minutes as needed for chest pain. 05/27/12  Yes Adeline Saralyn Pilar, MD  omeprazole (PRILOSEC OTC) 20 MG tablet Take 20 mg by mouth daily.   Yes Historical Provider, MD  ondansetron (ZOFRAN) 8 MG tablet Take 1 tablet (8 mg total) by mouth every 12 (twelve) hours as needed for nausea or vomiting. 05/08/13  Yes Concha Norway, MD  penicillin v potassium (VEETID) 250 MG tablet Take 250 mg by mouth daily.   Yes Historical Provider, MD  polyethylene glycol (MIRALAX / GLYCOLAX) packet Take 17 g by mouth daily as needed. Take two or three times a week   Yes Historical Provider, MD   predniSONE (DELTASONE) 5 MG tablet Take 5 mg by mouth daily.    Yes Historical Provider, MD  rifaximin (XIFAXAN) 550 MG TABS tablet Take 550 mg by mouth 2 (two) times daily.   Yes Historical Provider, MD  saccharomyces boulardii (FLORASTOR) 250 MG capsule Take 1 capsule (250 mg total) by mouth 2 (two) times daily. 05/14/13  Yes Willia Craze, NP  spironolactone (ALDACTONE) 50 MG tablet Take 1 tablet (50 mg total) by mouth daily. 03/29/13  Yes Gatha Mayer, MD  Thiamine HCl (VITAMIN B-1) 250 MG tablet Take 125 mg by mouth daily.    Yes Historical Provider, MD  glucose blood (FREESTYLE LITE) test strip 1 each by Other route 2 (two) times daily. And lancets: Dx code 250.01 09/21/12   Renato Shin, MD  Insulin Syringe-Needle U-100 (INSULIN SYRINGE .5CC/31GX5/16") 31G X 5/16" 0.5 ML MISC Use one syringe 4 times daily. Dx code 250.01 01/22/13   Renato Shin, MD   Physical Exam: Filed Vitals:   05/26/13 1111  BP: 125/48  Pulse: 56  Temp: 98.2 F (36.8 C)  Resp: 16     General:  alert  Eyes: eom-i  ENT: no oral ulcers   Neck: supple   Cardiovascular: s1,s2 rrr  Respiratory: CTA BL  Abdomen: soft, distended, nt  Skin: R leg erythema   Musculoskeletal: R leg edema, erythema, warm, tender  Psychiatric: no hallucinations   Neurologic: CN 2-12 intact, motor 5/5 BL  Labs on Admission:  Basic Metabolic Panel:  Recent Labs Lab 05/26/13 1215  NA 140  K 4.1  CL 100  CO2 28  GLUCOSE 185*  BUN 21  CREATININE 1.18  CALCIUM 9.3   Liver Function Tests:  Recent  Labs Lab 05/26/13 1215  AST 20  ALT 10  ALKPHOS 57  BILITOT 1.4*  PROT 6.4  ALBUMIN 3.3*   No results found for this basename: LIPASE, AMYLASE,  in the last 168 hours  Recent Labs Lab 05/26/13 1215  AMMONIA 29   CBC:  Recent Labs Lab 05/26/13 1215  WBC 5.0  NEUTROABS 3.8  HGB 10.7*  HCT 32.6*  MCV 98.8  PLT 37*   Cardiac Enzymes: No results found for this basename: CKTOTAL, CKMB, CKMBINDEX,  TROPONINI,  in the last 168 hours  BNP (last 3 results) No results found for this basename: PROBNP,  in the last 8760 hours CBG: No results found for this basename: GLUCAP,  in the last 168 hours  Radiological Exams on Admission: No results found.  EKG: Independently reviewed. Not done   Assessment/Plan Principal Problem:   Cellulitis Active Problems:   THROMBOCYTOPENIA, CHRONIC   HYPERTENSION, ESSENTIAL NOS   CIRRHOSIS, ALCOHOLIC   CORONARY ARTERY BYPASS GRAFT, HX OF   Congestive heart failure   DM (diabetes mellitus) with peripheral vascular complication   78 y.o. male with PMH of HTN, CAD s/p CABG, IDDM, CHF, HCC, alcoholic liver cirrhosis, recurrent R leg cellulitis presented with worsening R leg erythema, edema, pain for several days is admitted with recurrent R leg cellulitis  1. Recurrent R leg cellulitis due to Venous insufficiency; exam R leg worsened cellulitis; [place] neg for DVT; patient is on chronic penicillin PO per ID; recently PO doxycyline  -started IV atx in ED; cont leg elevation; compression device; cont PO vanc to prevent C diff; monitor clinically     2. Alcoholic liver cirrhosis, HCC complicated with coagulopathy, thrombocytopenia, GIB s/p TIPS, s/p radio embolization (3/16)  -next scheduled 4/8; cont per IR, oncology   3. IDDM, per wife patient is having hypoglycemia 39's with confusion; home regimen novolog 12-11-38 mealtime;  -HA1C-6.9 (03/2013); hold meal time scheduled insulin, start ISS +lantus 5 units only; titrate per response  4. CAD s/p CABG; chronic CHF; echo (2014), LVEF 60%, pulmonary HTN, dilated cardiomyopathy -clinically euvomelic; cont home regimen    5. Recent C diff atx associated; he has completed PO vanc; but will cont PO vanc while on IV atx for cellulitis   6. Anemia chronic; thrombocytopenia with liver cirrhosis; no s/s of obvious bleeding; cont monitoring   D/w patient, confirmed with his wife -He is DNR  Hold lovenox for SDVT  prophylaxis due to high risk of bleeding; cont SCD  None;  if consultant consulted, please document name and whether formally or informally consulted  Code Status: DNR (must indicate code status--if unknown or must be presumed, indicate so) Family Communication: d/w patient, his wife at the bedside (indicate person spoken with, if applicable, with phone number if by telephone) Disposition Plan: home pend clinical imrvement  (indicate anticipated LOS)  Time spent: >35 minutes   Kinnie Feil [facility] Pager [phone]  If 7PM-7AM, please contact night-coverage [email or web address] Password [email or web address] 05/26/2013, 1:58 PM

## 2013-05-26 NOTE — Progress Notes (Signed)
ANTIBIOTIC CONSULT NOTE - INITIAL  Pharmacy Consult for Vancomycin, Zosyn Indication: cellulitis  Allergies  Allergen Reactions  . Shellfish-Derived Products     Rash & angioedema  . Doxycycline Hyclate     Diarrhea   . Amoxicillin-Pot Clavulanate     Unsure what reaction was but pt tolerated penicillin 05/25/2012  . Aspirin     REACTION: unable to tolerate due to cirrhosis and varices  . Buprenorphine Hcl-Naloxone Hcl Other (See Comments)    unresponsive  . Codeine     Rash    . Ezetimibe     REACTION: LEG/JOINT PAIN  . Hydromorphone Hcl     unknown  . Meperidine Hcl     unknown  . Morphine     unknown  . Morphine Sulfate     REACTION: unspecified  . Oxycodone     hallucinations  . Sulfonamide Derivatives     Patient Measurements: Height: 6\' 3"  (190.5 cm) Weight: 245 lb 9.6 oz (111.403 kg) IBW/kg (Calculated) : 84.5  Vital Signs: Temp: 98.2 F (36.8 C) (04/05 1111) Temp src: Oral (04/05 1111) BP: 145/59 mmHg (04/05 1430) Pulse Rate: 66 (04/05 1430) Intake/Output from previous day:    Labs:  Recent Labs  05/26/13 1215  WBC 5.0  HGB 10.7*  PLT 37*  CREATININE 1.18   Estimated Creatinine Clearance: 72.9 ml/min (by C-G formula based on Cr of 1.18).   Microbiology: Recent Results (from the past 720 hour(s))  CULTURE, BLOOD (ROUTINE X 2)     Status: None   Collection Time    04/29/13  1:05 PM      Result Value Ref Range Status   Specimen Description BLOOD RIGHT WRIST   Final   Special Requests BOTTLES DRAWN AEROBIC AND ANAEROBIC 5CC EACH   Final   Culture  Setup Time     Final   Value: 04/29/2013 20:55     Performed at [facility]   Culture     Final   Value: STAPHYLOCOCCUS SPECIES (COAGULASE NEGATIVE)     Note: THE SIGNIFICANCE OF ISOLATING THIS ORGANISM FROM A SINGLE SET OF BLOOD CULTURES WHEN MULTIPLE SETS ARE DRAWN IS UNCERTAIN. PLEASE NOTIFY THE MICROBIOLOGY DEPARTMENT WITHIN ONE WEEK IF SPECIATION AND SENSITIVITIES ARE REQUIRED.   Note: Gram Stain Report Called to,Read Back By and Verified With: IFEOMA O@12 :24PM ON 04/30/13 BY DANTS     Performed at [facility]   Report Status 05/01/2013 FINAL   Final  CULTURE, BLOOD (ROUTINE X 2)     Status: None   Collection Time    04/29/13  1:51 PM      Result Value Ref Range Status   Specimen Description BLOOD RIGHT HAND   Final   Special Requests BOTTLES DRAWN AEROBIC AND ANAEROBIC [facility] EACH   Final   Culture  Setup Time     Final   Value: 04/29/2013 20:55     Performed at [facility]   Culture     Final   Value: NO GROWTH 5 DAYS     Performed at [facility]   Report Status 05/06/2013 FINAL   Final  URINE CULTURE     Status: None   Collection Time    04/29/13  2:05 PM      Result Value Ref Range Status   Specimen Description URINE, CATHETERIZED   Final   Special Requests Normal   Final   Culture  Setup Time     Final   Value: 04/29/2013  21:53     Performed at [facility]     Final   Value: NO GROWTH     Performed at [facility]   Culture     Final   Value: NO GROWTH     Performed at [facility]   Report Status 04/30/2013 FINAL   Final  CLOSTRIDIUM DIFFICILE BY PCR     Status: Abnormal   Collection Time    05/02/13  2:29 PM      Result Value Ref Range Status   C difficile by pcr POSITIVE (*) NEGATIVE Final   Comment: CRITICAL RESULT CALLED TO, READ BACK BY AND VERIFIED WITH:     Erroll Luna RN 05/03/13 0751 COSTELLO B     Performed at [place] History: Past Medical History  Diagnosis Date  . Diverticulosis   . Gastric antral vascular ectasia   . Rhinophyma   . Melanoma     resected by [facility] 2008  . CAD (coronary artery disease)     myoview 2007 no ischemia/ no ASA because of GI disease  . Atrial fibrillation     amiodarone in past, but problems and left off meds.  . Aortic stenosis     echo, September, 2010, mild aortic insufficiency  . Visual loss, one eye      right eye-ophthalmic arterial branch occlusion  . Internal hemorrhoids   . GERD (gastroesophageal reflux disease)   . Hypertension   . Allergic rhinitis   . Anemia     multifactorial  . Cirrhosis, alcoholic   . Thrombocytopenia   . Bleeding esophageal varices   . Encephalopathy, unspecified   . Gout   . Short-segment Barrett's esophagus     suspected  . Venous insufficiency   . Ejection fraction     EF 55%, echo, September, 2010  . Carotid bruit     Dopplers okay in the past  . Skin cancer     Basal cell and squamous cell  . Cellulitis     recurrent to RLE/notes 06/21/2012  . CHF (congestive heart failure)   . Sinus bradycardia     January, 2014  . Diastolic CHF     January, 2014  . Complication of anesthesia     "quit breathing once a long time ago; dropped my BP too; it was after I was given Dilaudid" (06/21/2012)  . Heart murmur   . Anginal pain   . Shortness of breath     "stay that way most of the time now" (06/21/2012)  . OSA on CPAP   . Diabetes mellitus, type 2   . History of blood transfusion     "not related to OR" (06/21/2012)  . H/O hiatal hernia   . Osteoarthritis     "ALL OVER" (06/21/2012)  . Chronic lower back pain   . Aspiration into airway-silent on modified barium swallow 03/20/2013    Anti-infectives: PTA >> Rifaximin >> PTA >> Penicillin VK >> 4/4 4/5 >> Cipro IV x1 4/5 >> PO Vanc >> 4/5 >> Vanc >> 4/5 >> Zosyn >>   Assessment: 57 yoM admitted 4/5 with recurrent R leg cellulitis presenting with worsening R leg erythema, edema, pain for several days.  [place] negative for DVT.  Per ID, pt is on chronic Penicillin VK 250mg  daily and rifaximin 550mg  PO BID.  He was recently admitted and completed a course of antibiotics (IV abx transitioned to PO Doxycycline, 3/13 -  05/10/13) and PO vancomycin (3/13-3/26/15) for +Cdiff.  PO vanc will be restarted while on IV antibiotics.  Pharmacy is consulted to dose vancomycin and Zosyn.   Tmax: 98.2  WBCs: 5  Renal:  SCr 1.18, CrCl ~ 73 ml/min CG, ~ 55 ml/min N  Recent admission with vanc trough 10.6 (3/12) on vancomycin 1500mg  IV q24h.  SCr 1.33  Goal of Therapy:  Vancomycin trough level 10-15 mcg/ml  Plan:   Zosyn 3.375g IV Q8H infused over 4hrs.  Vancomycin 1500mg  IV x1 dose (in addition to first vanc 1g for total loading dose of 2500mg )  Vancomycin 1500mg  IV q24h.  Measure Vanc trough at steady state.  Follow up renal fxn and culture results.   Gretta Arab PharmD, BCPS Pager [phone] 05/26/2013 3:27 PM

## 2013-05-26 NOTE — ED Notes (Signed)
Pt reports noting swelling and erythema to RLE on Friday, progressively worse - has recent hx of cellulitis to this extremity x2 weeks ago - pt takes daily penicillin PO per PCP however has not helped. Pt denies fever, n/v/d. Pt A&ox4, in no acute distress, resting comfortably on stretcher.

## 2013-05-27 ENCOUNTER — Encounter (HOSPITAL_COMMUNITY): Payer: Self-pay | Admitting: Radiology

## 2013-05-27 LAB — GLUCOSE, CAPILLARY
GLUCOSE-CAPILLARY: 298 mg/dL — AB (ref 70–99)
Glucose-Capillary: 185 mg/dL — ABNORMAL HIGH (ref 70–99)
Glucose-Capillary: 287 mg/dL — ABNORMAL HIGH (ref 70–99)
Glucose-Capillary: 291 mg/dL — ABNORMAL HIGH (ref 70–99)

## 2013-05-27 MED ORDER — INSULIN GLARGINE 100 UNIT/ML ~~LOC~~ SOLN
10.0000 [IU] | SUBCUTANEOUS | Status: DC
Start: 2013-05-27 — End: 2013-05-28
  Administered 2013-05-27: 10 [IU] via SUBCUTANEOUS
  Filled 2013-05-27: qty 0.1

## 2013-05-27 MED ORDER — TRAMADOL HCL 50 MG PO TABS
50.0000 mg | ORAL_TABLET | Freq: Once | ORAL | Status: AC
  Administered 2013-05-27: 50 mg via ORAL
  Filled 2013-05-27: qty 1

## 2013-05-27 NOTE — Progress Notes (Signed)
Nutrition Brief Note  Patient identified on the Malnutrition Screening Tool (MST) Report  Wt Readings from Last 15 Encounters:  05/26/13 245 lb 9.6 oz (111.403 kg)  05/15/13 257 lb 12.8 oz (116.937 kg)  05/14/13 257 lb 6.4 oz (116.756 kg)  05/10/13 253 lb 12.8 oz (115.123 kg)  05/08/13 248 lb 6.4 oz (112.674 kg)  05/04/13 240 lb (108.863 kg)  04/23/13 242 lb 6.4 oz (109.952 kg)  04/17/13 245 lb (111.131 kg)  04/01/13 254 lb (115.214 kg)  03/20/13 249 lb (112.946 kg)  03/07/13 250 lb (113.399 kg)  02/26/13 256 lb (116.121 kg)  02/07/13 260 lb (117.935 kg)  02/04/13 260 lb (117.935 kg)  01/22/13 252 lb (114.306 kg)    Body mass index is 30.7 kg/(m^2). Patient meets criteria for Obesity I based on current BMI.   Current diet order is Carb Modified, patient is consuming approximately 50-75% of meals at this time. Labs and medications reviewed.   Pt reported an unintentional wt loss of 25 lbs that has occurred gradually with liver cancer diagnosis. Endorsed a general loss of appetite, cellulitis infection and occasionally nausea that caused wt loss. Pt noted that he recently has been able to gain some of weight back and has much improved appetite, eating 3 meals/day per diet recall.   Pt noted that he typically eats slightly less in the hospital only d/t Carb Mod/heart healthy diet restrictions. Has tried Ensure supplement before but does not like taste.   No nutrition interventions warranted at this time. If nutrition issues arise, please consult RD.   Atlee Abide MS RD LDN Clinical Dietitian [phone]

## 2013-05-27 NOTE — H&P (Signed)
Chief Complaint: "I am scheduled for my liver treatment Wednesday." HPI: [name] is an 78 y.o. male with history of Cirrhosis/GIB complicated by encephalopathy s/p previous TIPS. The patient was found to have hepatocellular carcinoma s/p pre Y-90 visceral arteriography and embolization on 05/06/13 with mesenteric anatomy amenable to Y-90 radio embolization, post coil embolization of right gastric and gastroduodenal arteries and Tc61mAA test injection to the hepatic arterial tree to assess shunt function. S/p nuclear medicine imaging which revealed lung shunt fraction equals 3.2% The patient is scheduled on 05/29/13 for Y-90 radioembolization, he was admitted 4/5 with recurrent RLE cellulitis, he is on IV antibiotics with low grade fever, BP stable, blood Cx no growth so far. He has a history of RLE cellulitis secondary to venous insufficiency and C. Difficile and is on chronic penicillin and PO vancomycin. The patient also has a history of thrombocytopenia, platelets today are 37k, will need to be > 50k for procedure. He denies any chest pain, worsening shortness of breath or palpitations. He denies any active signs of bleeding or excessive bruising. The patient does have a history of sleep apnea and uses a CPAP. He has previously tolerated sedation without complications. He and his family state this episode of cellulitis is not as bad as usual because he did not become unresponsive this time around or as confused. He c/o RLE redness which usually his leg is "pinkish", he states his RLE is slightly swollen, but is always 2x the size of the LLE from venous insufficieny. He does admit to fevers.    Past Medical History:  Past Medical History  Diagnosis Date  . Diverticulosis   . Gastric antral vascular ectasia   . Rhinophyma   . Melanoma     [facility]  . CAD (coronary artery disease)     myoview 2007 no ischemia/ no ASA because of GI disease  . Atrial fibrillation     amiodarone in past,  but problems and left off meds.  . Aortic stenosis     echo, September, 2010, mild aortic insufficiency  . Visual loss, one eye     right eye-ophthalmic arterial branch occlusion  . Internal hemorrhoids   . GERD (gastroesophageal reflux disease)   . Hypertension   . Allergic rhinitis   . Anemia     multifactorial  . Cirrhosis, alcoholic   . Thrombocytopenia   . Bleeding esophageal varices   . Encephalopathy, unspecified   . Gout   . Short-segment Barrett's esophagus     suspected  . Venous insufficiency   . Ejection fraction     EF 55%, echo, September, 2010  . Carotid bruit     Dopplers okay in the past  . Skin cancer     Basal cell and squamous cell  . Cellulitis     recurrent to RLE/notes 06/21/2012  . CHF (congestive heart failure)   . Sinus bradycardia     January, 2014  . Diastolic CHF     January, 2014  . Complication of anesthesia     "quit breathing once a long time ago; dropped my BP too; it was after I was given Dilaudid" (06/21/2012)  . Heart murmur   . Anginal pain   . Shortness of breath     "stay that way most of the time now" (06/21/2012)  . OSA on CPAP   . Diabetes mellitus, type 2   . History of blood transfusion     "not related to OR" (06/21/2012)  .  H/O hiatal hernia   . Osteoarthritis     "ALL OVER" (06/21/2012)  . Chronic lower back pain   . Aspiration into airway-silent on modified barium swallow 03/20/2013    Past Surgical History:  Past Surgical History  Procedure Laterality Date  . Total knee arthroplasty Right   . Esophagogastroduodenoscopy  04/10/2008    esophageal varices, cardia varix  . Tips procedure  2/10  . Back surgery      X5 "for ruptured disc; last time did a fusion" (06/21/2012)  . Excisional hemorrhoidectomy    . Release scar contracture / graft repairs of hand      bilaterally   . Elbow surgery Right   . Hiatal hernia repair    . Colonoscopy w/ polypectomy  02/14/2007    adenomatous polyps, diverticulosis, internal  hemorrhoids/rectal varices  . Cataract extraction, bilateral  January 2013  . Tonsillectomy    . Appendectomy    . Joint replacement    . Coronary artery bypass graft  1993    6 Bypass  . Cataract extraction w/ intraocular lens  implant, bilateral      Family History:  Family History  Problem Relation Age of Onset  . Alzheimer's disease Mother   . Cancer Father     GI (stomach?)  . Heart disease Sister     2 sisters  . Liver disease Sister   . Diabetes Brother   . Heart disease Brother     3 brothers  . Liver disease Brother   . Heart attack Other     sibling  . Melanoma Other     sibling  . Stroke Other     sibling  . Colon cancer Neg Hx     Social History:  reports that he quit smoking about 38 years ago. His smoking use included Cigarettes. He has a 40 pack-year smoking history. He has never used smokeless tobacco. He reports that he does not drink alcohol or use illicit drugs.  Allergies:  Allergies  Allergen Reactions  . Shellfish-Derived Products     Rash & angioedema  . Doxycycline Hyclate     Diarrhea   . Amoxicillin-Pot Clavulanate     Unsure what reaction was but pt tolerated penicillin 05/25/2012  . Aspirin     REACTION: unable to tolerate due to cirrhosis and varices  . Buprenorphine Hcl-Naloxone Hcl Other (See Comments)    unresponsive  . Codeine     Rash    . Ezetimibe     REACTION: LEG/JOINT PAIN  . Hydromorphone Hcl     unknown  . Meperidine Hcl     unknown  . Morphine     unknown  . Morphine Sulfate     REACTION: unspecified  . Oxycodone     hallucinations  . Sulfonamide Derivatives     Medications:   Medication List    ASK your doctor about these medications       acetaminophen 500 MG tablet  Commonly known as:  TYLENOL  Take 1,000 mg by mouth every 6 (six) hours as needed for mild pain.     allopurinol 300 MG tablet  Commonly known as:  ZYLOPRIM  Take 150 mg by mouth every morning.     CALCIUM/MAGNESIUM/ZINC FORMULA  1000-500-50 MG Tabs  Generic drug:  Calcium-Magnesium-Zinc  Take 1 tablet by mouth every morning.     fentaNYL 100 MCG/HR  Commonly known as:  DURAGESIC - dosed mcg/hr  Place 100 mcg onto the skin every 3 (  three) days.     furosemide 40 MG tablet  Commonly known as:  LASIX  Take 1 tablet (40 mg total) by mouth 2 (two) times daily.     gabapentin 300 MG capsule  Commonly known as:  NEURONTIN  Take 1 capsule (300 mg total) by mouth 3 (three) times daily.     glucose blood test strip  Commonly known as:  FREESTYLE LITE  1 each by Other route 2 (two) times daily. And lancets: Dx code 250.01     insulin aspart 100 UNIT/ML injection  Commonly known as:  novoLOG  Inject 10-40 Units into the skin 3 (three) times daily before meals. 20 units with breakfast, 10 units with lunch and 40 units with the evening meal.     INSULIN SYRINGE .5CC/31GX5/16" 31G X 5/16" 0.5 ML Misc  Use one syringe 4 times daily. Dx code 250.01     Iron 240 (27 FE) MG Tabs  Take 1 tablet by mouth 2 (two) times daily.     isosorbide mononitrate 60 MG 24 hr tablet  Commonly known as:  IMDUR  Take 60 mg by mouth every morning.     lactulose 10 GM/15ML solution  Commonly known as:  CHRONULAC  Take 30 mLs (20 g total) by mouth 2 (two) times daily.     loratadine 10 MG tablet  Commonly known as:  CLARITIN  Take 10 mg by mouth every morning.     metoprolol tartrate 25 MG tablet  Commonly known as:  LOPRESSOR  Take 0.5 tablets (12.5 mg total) by mouth 2 (two) times daily.     nitroGLYCERIN 0.4 MG SL tablet  Commonly known as:  NITROSTAT  Place 1 tablet (0.4 mg total) under the tongue every 5 (five) minutes as needed for chest pain.     omeprazole 20 MG tablet  Commonly known as:  PRILOSEC OTC  Take 20 mg by mouth daily.     ondansetron 8 MG tablet  Commonly known as:  ZOFRAN  Take 1 tablet (8 mg total) by mouth every 12 (twelve) hours as needed for nausea or vomiting.     penicillin v potassium 250 MG  tablet  Commonly known as:  VEETID  Take 250 mg by mouth daily.     polyethylene glycol packet  Commonly known as:  MIRALAX / GLYCOLAX  Take 17 g by mouth daily as needed. Take two or three times a week     predniSONE 5 MG tablet  Commonly known as:  DELTASONE  Take 5 mg by mouth daily.     rifaximin 550 MG Tabs tablet  Commonly known as:  XIFAXAN  Take 550 mg by mouth 2 (two) times daily.     saccharomyces boulardii 250 MG capsule  Commonly known as:  FLORASTOR  Take 1 capsule (250 mg total) by mouth 2 (two) times daily.     spironolactone 50 MG tablet  Commonly known as:  ALDACTONE  Take 1 tablet (50 mg total) by mouth daily.     vitamin B-1 250 MG tablet  Take 125 mg by mouth daily.     vitamin C 500 MG tablet  Commonly known as:  ASCORBIC ACID  Take 500 mg by mouth daily.        Please HPI for pertinent positives, otherwise complete 10 system ROS negative.  Physical Exam: BP 140/41  Pulse 60  Temp(Src) 99.4 F (37.4 C) (Oral)  Resp 20  Ht _0  (1.905 m)  Wt 245  lb 9.6 oz (111.403 kg)  BMI 30.70 kg/m2  SpO2 97% Body mass index is 30.7 kg/(m^2).  General Appearance:  Alert, cooperative, no distress  Head:  Normocephalic, without obvious abnormality, atraumatic  Neck: Supple, symmetrical, trachea midline  Lungs:   Clear to auscultation bilaterally, no w/r/r, respirations unlabored without use of accessory muscles.  Chest Wall:  No tenderness or deformity  Heart:  Regular rate and rhythm, 3/6 blowing systolic murmur heard over aortic valve.  Abdomen:   Soft, non-tender, non distended, (+) BS  Extremities: RLE swelling and extending erythema to below knee, atraumatic, no cyanosis, LLE no edema or erythema.  Neurologic: Normal affect, no gross deficits.   Results for orders placed during the hospital encounter of 05/26/13 (from the past 48 hour(s))  CULTURE, BLOOD (ROUTINE X 2)     Status: None   Collection Time    05/26/13 12:05 PM      Result Value Ref  Range   Specimen Description BLOOD BLOOD RIGHT FOREARM     Special Requests BOTTLES DRAWN AEROBIC AND ANAEROBIC 3CC EACH     Culture  Setup Time       Value: 05/26/2013 18:35     Performed at [facility]   Culture       Value:        BLOOD CULTURE RECEIVED NO GROWTH TO DATE CULTURE WILL BE HELD FOR 5 DAYS BEFORE ISSUING A FINAL NEGATIVE REPORT     Performed at [facility]   Report Status PENDING    CBC WITH DIFFERENTIAL     Status: Abnormal   Collection Time    05/26/13 12:15 PM      Result Value Ref Range   WBC 5.0  4.0 - 10.5 K/uL   RBC 3.30 (*) 4.22 - 5.81 MIL/uL   Hemoglobin 10.7 (*) 13.0 - 17.0 g/dL   HCT 32.6 (*) 39.0 - 52.0 %   MCV 98.8  78.0 - 100.0 fL   MCH 32.4  26.0 - 34.0 pg   MCHC 32.8  30.0 - 36.0 g/dL   RDW 14.7  11.5 - 15.5 %   Platelets 37 (*) 150 - 400 K/uL   Comment: SPECIMEN CHECKED FOR CLOTS     RESULT REPEATED AND VERIFIED     PLATELET COUNT CONFIRMED BY SMEAR   Neutrophils Relative % 76  43 - 77 %   Neutro Abs 3.8  1.7 - 7.7 K/uL   Lymphocytes Relative 14  12 - 46 %   Lymphs Abs 0.7  0.7 - 4.0 K/uL   Monocytes Relative 10  3 - 12 %   Monocytes Absolute 0.5  0.1 - 1.0 K/uL   Eosinophils Relative 0  0 - 5 %   Eosinophils Absolute 0.0  0.0 - 0.7 K/uL   Basophils Relative 0  0 - 1 %   Basophils Absolute 0.0  0.0 - 0.1 K/uL  COMPREHENSIVE METABOLIC PANEL     Status: Abnormal   Collection Time    05/26/13 12:15 PM      Result Value Ref Range   Sodium 140  137 - 147 mEq/L   Potassium 4.1  3.7 - 5.3 mEq/L   Chloride 100  96 - 112 mEq/L   CO2 28  19 - 32 mEq/L   Glucose, Bld 185 (*) 70 - 99 mg/dL   BUN 21  6 - 23 mg/dL   Creatinine, Ser 1.18  0.50 - 1.35 mg/dL   Calcium 9.3  8.4 -  10.5 mg/dL   Total Protein 6.4  6.0 - 8.3 g/dL   Albumin 3.3 (*) 3.5 - 5.2 g/dL   AST 20  0 - 37 U/L   ALT 10  0 - 53 U/L   Alkaline Phosphatase 57  39 - 117 U/L   Total Bilirubin 1.4 (*) 0.3 - 1.2 mg/dL   GFR calc non Af Amer 59 (*) >90 mL/min   GFR  calc Af Amer 68 (*) >90 mL/min   Comment: (NOTE)     The eGFR has been calculated using the CKD EPI equation.     This calculation has not been validated in all clinical situations.     eGFR's persistently <90 mL/min signify possible Chronic Kidney     Disease.  CULTURE, BLOOD (ROUTINE X 2)     Status: None   Collection Time    05/26/13 12:15 PM      Result Value Ref Range   Specimen Description BLOOD RIGHT HAND     Special Requests BOTTLES DRAWN AEROBIC ONLY 4CC     Culture  Setup Time       Value: 05/26/2013 18:35     Performed at [facility]   Culture       Value:        BLOOD CULTURE RECEIVED NO GROWTH TO DATE CULTURE WILL BE HELD FOR 5 DAYS BEFORE ISSUING A FINAL NEGATIVE REPORT     Performed at [facility]   Report Status PENDING    PROTIME-INR     Status: Abnormal   Collection Time    05/26/13 12:15 PM      Result Value Ref Range   Prothrombin Time 16.9 (*) 11.6 - 15.2 seconds   INR 1.41  0.00 - 1.49  AMMONIA     Status: None   Collection Time    05/26/13 12:15 PM      Result Value Ref Range   Ammonia 29  11 - 60 umol/L  I-STAT TROPOININ, ED     Status: None   Collection Time    05/26/13 12:27 PM      Result Value Ref Range   Troponin i, poc 0.01  0.00 - 0.08 ng/mL   Comment 3            Comment: Due to the release kinetics of cTnI,     a negative result within the first hours     of the onset of symptoms does not rule out     myocardial infarction with certainty.     If myocardial infarction is still suspected,     repeat the test at appropriate intervals.  GLUCOSE, CAPILLARY     Status: Abnormal   Collection Time    05/26/13  5:15 PM      Result Value Ref Range   Glucose-Capillary 233 (*) 70 - 99 mg/dL  GLUCOSE, CAPILLARY     Status: Abnormal   Collection Time    05/26/13  9:38 PM      Result Value Ref Range   Glucose-Capillary 274 (*) 70 - 99 mg/dL   Comment 1 Notify RN    GLUCOSE, CAPILLARY     Status: Abnormal   Collection Time     05/27/13  7:19 AM      Result Value Ref Range   Glucose-Capillary 185 (*) 70 - 99 mg/dL  GLUCOSE, CAPILLARY     Status: Abnormal   Collection Time    05/27/13 11:39 AM  Result Value Ref Range   Glucose-Capillary 298 (*) 70 - 99 mg/dL   Comment 1 Documented in Chart     Comment 2 Notify RN     No results found.  Assessment/Plan History of Cirrhosis/GIB s/p previous TIPS. Hepatocellular carcinoma s/p pre Y-90 visceral arteriography and embolization on 05/06/13 with mesenteric anatomy amenable to Y-90 radio embolization, post coil embolization of right gastric and gastroduodenal arteries and Tc62mAA test injection to the hepatic arterial tree to assess shunt function. S/p nuclear medicine imaging which revealed lung shunt fraction equals 3.2% Patient is scheduled 05/29/13 for Y-90 radioembolization. Admitted with recurrent RLE cellulitis, on IV antibiotics with low grade fever, BP stable, blood Cx no growth so far.  History of RLE cellulitis secondary to venous insufficiency and C. Difficile is on chronic penicillin and PO vancomycin.  Thrombocytopenia, platelets 37k, will need to be > 50k for procedure. IDDM. CAD s/p CABG Chronic diastolic CHF Aortic stenosis. OSA, uses CPAP.   IR will follow the patient and if cellulitis improved, patient afebrile and stable by 4/8 will proceed with Y-90 radioembolization, this has been discussed today with Dr. HVernard Gambles   MTsosie BillingD PA-C 05/27/2013, 12:01 PM

## 2013-05-27 NOTE — Progress Notes (Signed)
[facility] PROGRESS NOTE  [name] WNI:[id number] DOB: [birth year] DOA: 05/26/2013 PCP: Unice Cobble, MD  Assessment/Plan:  Principal Problem:  Cellulitis  Active Problems:  THROMBOCYTOPENIA, CHRONIC  HYPERTENSION, ESSENTIAL NOS  CIRRHOSIS, ALCOHOLIC  CORONARY ARTERY BYPASS GRAFT, HX OF  Congestive heart failure  DM (diabetes mellitus) with peripheral vascular complication   78 y.o. male with PMH of HTN, CAD s/p CABG, IDDM, CHF, HCC, alcoholic liver cirrhosis, recurrent R leg cellulitis presented with worsening R leg erythema, edema, pain for several days is admitted with recurrent R leg cellulitis   1. Recurrent R leg cellulitis due to Venous insufficiency; exam R leg worsened cellulitis; [place] neg for DVT; patient is on chronic penicillin PO per ID; recently PO doxycyline  -started IV atx in ED; cont leg elevation; compression device; cont PO vanc to prevent C diff; monitor clinically  2. Alcoholic liver cirrhosis, HCC complicated with coagulopathy, thrombocytopenia, GIB s/p TIPS, s/p radio embolization (3/16)  -next scheduled 4/8; d/w IR, cont oncology care  3. IDDM, per wife patient is having hypoglycemia 39's with confusion; home regimen novolog 12-11-38 mealtime;  -HA1C-6.9 (03/2013); hold meal time scheduled insulin, cont ISS +lantus 10 units; titrate per response  4. CAD s/p CABG; chronic CHF; echo (2014), LVEF 60%, pulmonary HTN, dilated cardiomyopathy  -clinically euvomelic; cont home regimen  5. Recent C diff atx associated; he has completed PO vanc; but will cont PO vanc while on IV atx for cellulitis  6. Anemia chronic; thrombocytopenia with liver cirrhosis; no s/s of obvious bleeding; cont monitoring    D/w patient, confirmed with his wife  -He is DNR   Hold lovenox for DVT prophylaxis due to high risk of bleeding; cont SCD   IR; if consultant consulted, please document name and whether formally or informally consulted  Code Status: DNR (must indicate code  status--if unknown or must be presumed, indicate so)  Family Communication: d/w patient, his wife at the bedside (indicate person spoken with, if applicable, with phone number if by telephone)  Disposition Plan: home pend clinical imrvement (indicate anticipated LOS)      Consultants:  Radiology  Procedures:  Pend IR embolization 4/8  Antibiotics:  vanc 4/5>>>>>>  Zosyn 4/5>>>>>   (indicate start date, and stop date if known)  HPI/Subjective: alert  Objective: Filed Vitals:   05/27/13 0500  BP: 140/41  Pulse: 60  Temp: 99.4 F (37.4 C)  Resp: 20    Intake/Output Summary (Last 24 hours) at 05/27/13 0850 Last data filed at 05/27/13 0531  Gross per 24 hour  Intake    150 ml  Output   1250 ml  Net  -1100 ml   Filed Weights   05/26/13 1430  Weight: 111.403 kg (245 lb 9.6 oz)    Exam:   General:  alert  Cardiovascular: s1,s2 rrr  Respiratory: CTA BL  Abdomen: soft, distended, nt  Musculoskeletal: R leg edema, erythema improving    Data Reviewed: Basic Metabolic Panel:  Recent Labs Lab 05/26/13 1215  NA 140  K 4.1  CL 100  CO2 28  GLUCOSE 185*  BUN 21  CREATININE 1.18  CALCIUM 9.3   Liver Function Tests:  Recent Labs Lab 05/26/13 1215  AST 20  ALT 10  ALKPHOS 57  BILITOT 1.4*  PROT 6.4  ALBUMIN 3.3*   No results found for this basename: LIPASE, AMYLASE,  in the last 168 hours  Recent Labs Lab 05/26/13 1215  AMMONIA 29   CBC:  Recent Labs Lab 05/26/13  1215  WBC 5.0  NEUTROABS 3.8  HGB 10.7*  HCT 32.6*  MCV 98.8  PLT 37*   Cardiac Enzymes: No results found for this basename: CKTOTAL, CKMB, CKMBINDEX, TROPONINI,  in the last 168 hours BNP (last 3 results) No results found for this basename: PROBNP,  in the last 8760 hours CBG:  Recent Labs Lab 05/26/13 1715 05/26/13 2138 05/27/13 0719  GLUCAP 233* 274* 185*    Recent Results (from the past 240 hour(s))  CULTURE, BLOOD (ROUTINE X 2)     Status: None    Collection Time    05/26/13 12:05 PM      Result Value Ref Range Status   Specimen Description BLOOD BLOOD RIGHT FOREARM   Final   Special Requests BOTTLES DRAWN AEROBIC AND ANAEROBIC [facility] EACH   Final   Culture  Setup Time     Final   Value: 05/26/2013 18:35     Performed at [facility]   Culture     Final   Value:        BLOOD CULTURE RECEIVED NO GROWTH TO DATE CULTURE WILL BE HELD FOR 5 DAYS BEFORE ISSUING A FINAL NEGATIVE REPORT     Performed at [facility]   Report Status PENDING   Incomplete  CULTURE, BLOOD (ROUTINE X 2)     Status: None   Collection Time    05/26/13 12:15 PM      Result Value Ref Range Status   Specimen Description BLOOD RIGHT HAND   Final   Special Requests BOTTLES DRAWN AEROBIC ONLY 4CC   Final   Culture  Setup Time     Final   Value: 05/26/2013 18:35     Performed at [facility]   Culture     Final   Value:        BLOOD CULTURE RECEIVED NO GROWTH TO DATE CULTURE WILL BE HELD FOR 5 DAYS BEFORE ISSUING A FINAL NEGATIVE REPORT     Performed at [facility]   Report Status PENDING   Incomplete     Studies: No results found.  Scheduled Meds: . allopurinol  150 mg Oral q morning - 10a  . fentaNYL  100 mcg Transdermal Q72H  . ferrous gluconate  324 mg Oral BID WC  . furosemide  40 mg Oral BID WC  . gabapentin  300 mg Oral TID  . insulin aspart  0-9 Units Subcutaneous TID WC  . insulin glargine  5 Units Subcutaneous Q24H  . isosorbide mononitrate  60 mg Oral q morning - 10a  . lactulose  20 g Oral BID  . loratadine  10 mg Oral q morning - 10a  . metoprolol tartrate  12.5 mg Oral BID  . pantoprazole  40 mg Oral Daily  . piperacillin-tazobactam (ZOSYN)  IV  3.375 g Intravenous 3 times per day  . rifaximin  550 mg Oral BID  . saccharomyces boulardii  250 mg Oral BID  . spironolactone  50 mg Oral Daily  . vancomycin  1,500 mg Intravenous Q24H  . vancomycin  125 mg Oral 4 times per day  . vitamin C  500 mg Oral Daily    Continuous Infusions:   Principal Problem:   Cellulitis Active Problems:   THROMBOCYTOPENIA, CHRONIC   HYPERTENSION, ESSENTIAL NOS   CIRRHOSIS, ALCOHOLIC   CORONARY ARTERY BYPASS GRAFT, HX OF   Congestive heart failure   DM (diabetes mellitus) with peripheral vascular complication    Time spent: >35 minutes  Kinnie Feil  [facility] Pager [phone]. If 7PM-7AM, please contact night-coverage at [email or web address], password [facility] 05/27/2013, 8:50 AM  LOS: 1 day

## 2013-05-27 NOTE — Progress Notes (Signed)
Inpatient Diabetes Program Recommendations  AACE/ADA: New Consensus Statement on Inpatient Glycemic Control (2013)  Target Ranges:  Prepandial:   less than 140 mg/dL      Peak postprandial:   less than 180 mg/dL (1-2 hours)      Critically ill patients:  140 - 180 mg/dL   Reason for Visit: Hyperglycemia Diabetes history: DM2 Outpatient Diabetes medications: Novolog 20-10-40 tidwc Current orders for Inpatient glycemic control: Lantus 10 QAM and Novolog sensitive tidwc  Pt is having low blood sugars at home. Doubtful HgbA1C is accurate with low H/H. Eating 75-100% meals.  Please consider adding meal coverage insulin - Novolog 8 units tidwc if pt eats >50% meal.  Will continue to follow. Thank you. Lorenda Peck, RD, LDN, CDE Inpatient Diabetes Coordinator [phone]

## 2013-05-28 DIAGNOSIS — E119 Type 2 diabetes mellitus without complications: Secondary | ICD-10-CM

## 2013-05-28 DIAGNOSIS — I5030 Unspecified diastolic (congestive) heart failure: Secondary | ICD-10-CM

## 2013-05-28 LAB — BASIC METABOLIC PANEL
BUN: 15 mg/dL (ref 6–23)
CO2: 28 mEq/L (ref 19–32)
CREATININE: 1.15 mg/dL (ref 0.50–1.35)
Calcium: 8.5 mg/dL (ref 8.4–10.5)
Chloride: 97 mEq/L (ref 96–112)
GFR calc Af Amer: 70 mL/min — ABNORMAL LOW (ref >90)
GFR, EST NON AFRICAN AMERICAN: 60 mL/min — AB (ref >90)
Glucose, Bld: 281 mg/dL — ABNORMAL HIGH (ref 70–99)
POTASSIUM: 4 meq/L (ref 3.7–5.3)
Sodium: 136 mEq/L — ABNORMAL LOW (ref 137–147)

## 2013-05-28 LAB — CBC
HCT: 30.6 % — ABNORMAL LOW (ref 39.0–52.0)
Hemoglobin: 10.3 g/dL — ABNORMAL LOW (ref 13.0–17.0)
MCH: 32.7 pg (ref 26.0–34.0)
MCHC: 33.7 g/dL (ref 30.0–36.0)
MCV: 97.1 fL (ref 78.0–100.0)
PLATELETS: 50 10*3/uL — AB (ref 150–400)
RBC: 3.15 MIL/uL — ABNORMAL LOW (ref 4.22–5.81)
RDW: 14.6 % (ref 11.5–15.5)
WBC: 3.9 10*3/uL — AB (ref 4.0–10.5)

## 2013-05-28 LAB — GLUCOSE, CAPILLARY
GLUCOSE-CAPILLARY: 283 mg/dL — AB (ref 70–99)
Glucose-Capillary: 195 mg/dL — ABNORMAL HIGH (ref 70–99)
Glucose-Capillary: 288 mg/dL — ABNORMAL HIGH (ref 70–99)
Glucose-Capillary: 205 mg/dL — ABNORMAL HIGH (ref 70–99)

## 2013-05-28 MED ORDER — INSULIN GLARGINE 100 UNIT/ML ~~LOC~~ SOLN
15.0000 [IU] | SUBCUTANEOUS | Status: DC
  Administered 2013-05-28 – 2013-05-29 (×2): 15 [IU] via SUBCUTANEOUS
  Filled 2013-05-28 (×3): qty 0.15

## 2013-05-28 MED ORDER — DEXAMETHASONE SODIUM PHOSPHATE 10 MG/ML IJ SOLN
20.0000 mg | Freq: Once | INTRAMUSCULAR | Status: AC
  Administered 2013-05-29: 20 mg via INTRAVENOUS
  Filled 2013-05-28: qty 2

## 2013-05-28 MED ORDER — PANTOPRAZOLE SODIUM 40 MG PO TBEC
40.0000 mg | DELAYED_RELEASE_TABLET | Freq: Every day | ORAL | Status: DC
  Administered 2013-05-30: 40 mg via ORAL
  Filled 2013-05-28: qty 1

## 2013-05-28 MED ORDER — INSULIN ASPART 100 UNIT/ML ~~LOC~~ SOLN
5.0000 [IU] | Freq: Three times a day (TID) | SUBCUTANEOUS | Status: DC
  Administered 2013-05-28 – 2013-05-30 (×6): 5 [IU] via SUBCUTANEOUS

## 2013-05-28 MED ORDER — ONDANSETRON HCL 4 MG/2ML IJ SOLN
4.0000 mg | Freq: Once | INTRAMUSCULAR | Status: AC
  Administered 2013-05-29: 4 mg via INTRAVENOUS

## 2013-05-28 MED ORDER — PANTOPRAZOLE SODIUM 40 MG IV SOLR
40.0000 mg | Freq: Once | INTRAVENOUS | Status: AC
  Administered 2013-05-29: 40 mg via INTRAVENOUS
  Filled 2013-05-28: qty 40

## 2013-05-28 MED ORDER — VANCOMYCIN HCL IN DEXTROSE 1-5 GM/200ML-% IV SOLN
1000.0000 mg | Freq: Two times a day (BID) | INTRAVENOUS | Status: DC
  Administered 2013-05-28 (×2): 1000 mg via INTRAVENOUS
  Filled 2013-05-28 (×4): qty 200

## 2013-05-28 NOTE — Progress Notes (Signed)
IR PA has seen and evaluated the patient today, he has remained afebrile with NGTD in blood Cx. I have spoke with Dr. Vernard Gambles regarding the Y-90 radioembolization procedure scheduled for tomorrow and if the patient continues to be afebrile overnight, he will receive 2 units of platelets starting at 0600 on 05/29/13, labs and procedure medications ordered, patient will be NPO after midnight.  Tsosie Billing PA-C Interventional Radiology  05/28/13  1:43 PM

## 2013-05-28 NOTE — Progress Notes (Signed)
ANTIBIOTIC CONSULT NOTE - Follow up  Pharmacy Consult for Vancomycin, Zosyn Indication: cellulitis  Allergies  Allergen Reactions  . Shellfish-Derived Products     Rash & angioedema  . Doxycycline Hyclate     Diarrhea   . Amoxicillin-Pot Clavulanate     Unsure what reaction was but pt tolerated penicillin 05/25/2012  . Aspirin     REACTION: unable to tolerate due to cirrhosis and varices  . Buprenorphine Hcl-Naloxone Hcl Other (See Comments)    unresponsive  . Codeine     Rash    . Ezetimibe     REACTION: LEG/JOINT PAIN  . Hydromorphone Hcl     unknown  . Meperidine Hcl     unknown  . Morphine     unknown  . Morphine Sulfate     REACTION: unspecified  . Oxycodone     hallucinations  . Sulfonamide Derivatives     Patient Measurements: Height: 6\' 3"  (190.5 cm) Weight: 245 lb 9.6 oz (111.403 kg) IBW/kg (Calculated) : 84.5  Vital Signs: Temp: 97.8 F (36.6 C) (04/07 0536) Temp src: Oral (04/07 0536) BP: 143/59 mmHg (04/07 0536) Pulse Rate: 54 (04/07 0536) Intake/Output from previous day: 04/06 0701 - 04/07 0700 In: 1510 [P.O.:960; IV Piggyback:550] Out: 2325 [Urine:2325]  Labs:  Recent Labs  05/26/13 1215 05/28/13 0330  WBC 5.0  --   HGB 10.7*  --   PLT 37*  --   CREATININE 1.18 1.15   Estimated Creatinine Clearance: 74.8 ml/min (by C-G formula based on Cr of 1.15).   Microbiology: Recent Results (from the past 720 hour(s))  CULTURE, BLOOD (ROUTINE X 2)     Status: None   Collection Time    04/29/13  1:05 PM      Result Value Ref Range Status   Specimen Description BLOOD RIGHT WRIST   Final   Special Requests BOTTLES DRAWN AEROBIC AND ANAEROBIC 5CC EACH   Final   Culture  Setup Time     Final   Value: 04/29/2013 20:55     Performed at [facility]   Culture     Final   Value: STAPHYLOCOCCUS SPECIES (COAGULASE NEGATIVE)     Note: THE SIGNIFICANCE OF ISOLATING THIS ORGANISM FROM A SINGLE SET OF BLOOD CULTURES WHEN MULTIPLE SETS ARE DRAWN  IS UNCERTAIN. PLEASE NOTIFY THE MICROBIOLOGY DEPARTMENT WITHIN ONE WEEK IF SPECIATION AND SENSITIVITIES ARE REQUIRED.     Note: Gram Stain Report Called to,Read Back By and Verified With: IFEOMA O@12 :24PM ON 04/30/13 BY DANTS     Performed at [facility]   Report Status 05/01/2013 FINAL   Final  CULTURE, BLOOD (ROUTINE X 2)     Status: None   Collection Time    04/29/13  1:51 PM      Result Value Ref Range Status   Specimen Description BLOOD RIGHT HAND   Final   Special Requests BOTTLES DRAWN AEROBIC AND ANAEROBIC [facility] EACH   Final   Culture  Setup Time     Final   Value: 04/29/2013 20:55     Performed at [facility]   Culture     Final   Value: NO GROWTH 5 DAYS     Performed at [facility]   Report Status 05/06/2013 FINAL   Final  URINE CULTURE     Status: None   Collection Time    04/29/13  2:05 PM      Result Value Ref Range Status   Specimen Description URINE,  CATHETERIZED   Final   Special Requests Normal   Final   Culture  Setup Time     Final   Value: 04/29/2013 21:53     Performed at [place]     Final   Value: NO GROWTH     Performed at [facility]   Culture     Final   Value: NO GROWTH     Performed at [facility]   Report Status 04/30/2013 FINAL   Final  CLOSTRIDIUM DIFFICILE BY PCR     Status: Abnormal   Collection Time    05/02/13  2:29 PM      Result Value Ref Range Status   C difficile by pcr POSITIVE (*) NEGATIVE Final   Comment: CRITICAL RESULT CALLED TO, READ BACK BY AND VERIFIED WITH:     Erroll Luna RN 05/03/13 0751 COSTELLO B     Performed at [place], BLOOD (ROUTINE X 2)     Status: None   Collection Time    05/26/13 12:05 PM      Result Value Ref Range Status   Specimen Description BLOOD BLOOD RIGHT FOREARM   Final   Special Requests BOTTLES DRAWN AEROBIC AND ANAEROBIC 3CC EACH   Final   Culture  Setup Time     Final   Value: 05/26/2013 18:35     Performed at  [facility]   Culture     Final   Value:        BLOOD CULTURE RECEIVED NO GROWTH TO DATE CULTURE WILL BE HELD FOR 5 DAYS BEFORE ISSUING A FINAL NEGATIVE REPORT     Performed at [facility]   Report Status PENDING   Incomplete  CULTURE, BLOOD (ROUTINE X 2)     Status: None   Collection Time    05/26/13 12:15 PM      Result Value Ref Range Status   Specimen Description BLOOD RIGHT HAND   Final   Special Requests BOTTLES DRAWN AEROBIC ONLY 4CC   Final   Culture  Setup Time     Final   Value: 05/26/2013 18:35     Performed at [facility]   Culture     Final   Value:        BLOOD CULTURE RECEIVED NO GROWTH TO DATE CULTURE WILL BE HELD FOR 5 DAYS BEFORE ISSUING A FINAL NEGATIVE REPORT     Performed at [facility]   Report Status PENDING   Incomplete   Anti-infectives: PTA >> Rifaximin >> PTA >> Penicillin VK >> 4/4 4/5 >> Cipro IV x1 4/5 >> PO Vanc >> 4/5 >> Vanc >> 4/5 >> Zosyn >>   Assessment: 53 yoM admitted 4/5 with recurrent R leg cellulitis presenting with worsening R leg erythema, edema, pain for several days.  [place] negative for DVT.  Per ID, pt is on chronic Penicillin VK 250mg  daily and rifaximin 550mg  PO BID.  He was recently admitted and completed a course of antibiotics (IV abx transitioned to PO Doxycycline, 3/13 - 05/10/13) and PO vancomycin (3/13-3/26/15) for +Cdiff.  PO vanc will be restarted while on IV antibiotics.  Pharmacy is consulted to dose vancomycin and Zosyn.   Tmax: afebrile  WBCs: 5(4/5)  Renal: SCr improved to 1.15, CrCl ~69ml/min CG, ~ 56 ml/min N  Recent admission with vanc trough 10.6(3/12) on vancomycin 1500mg  IV q24h(SCr was 1.33).  Goal of Therapy:  Vancomycin trough level  10-15 mcg/ml  Plan:   Cont Zosyn 3.375g IV Q8H infused over 4hrs.  D/t improved SCr and borderline low Vanc trough on 1.5g q24h when SCr was worse, switch to 1g q12h.  Measure Vanc trough at steady state.  Follow up renal fxn and  culture results.  Romeo Rabon, PharmD, pager [phone]. 05/28/2013,9:54 AM.

## 2013-05-28 NOTE — Progress Notes (Signed)
[facility] PROGRESS NOTE  [name] YTK:[id number] DOB: 1935/02/23 DOA: 05/26/2013 PCP: Unice Cobble, MD  Assessment/Plan:  Principal Problem:  Cellulitis  Active Problems:  THROMBOCYTOPENIA, CHRONIC  HYPERTENSION, ESSENTIAL NOS  CIRRHOSIS, ALCOHOLIC  CORONARY ARTERY BYPASS GRAFT, HX OF  Congestive heart failure  DM (diabetes mellitus) with peripheral vascular complication   78 y.o. male with PMH of HTN, CAD s/p CABG, IDDM, CHF, HCC, alcoholic liver cirrhosis, recurrent R leg cellulitis presented with worsening R leg erythema, edema, pain for several days is admitted with recurrent R leg cellulitis   1. Recurrent R leg cellulitis due to Venous insufficiency; exam R leg worsened cellulitis; [place] neg for DVT; patient is on chronic penicillin PO per ID; recently he was on PO doxycyline  -improving on IV atx in ED; cont leg elevation; compression device; cont PO vanc to prevent C diff; monitor clinically  2. Alcoholic liver cirrhosis, HCC complicated with coagulopathy, thrombocytopenia, GIB s/p TIPS, s/p radio embolization (3/16)  -next scheduled 4/8; d/w IR, cont oncology care  3. IDDM, per wife patient is having hypoglycemia 39's with confusion; home regimen novolog 12-11-38 mealtime;  -HA1C-6.9 (03/2013); restarted meal time scheduled insulin 5, +ISS  Added lantus 15 units; titrate per response  4. CAD s/p CABG; chronic CHF; echo (2014), LVEF 60%, pulmonary HTN, dilated cardiomyopathy  -clinically euvomelic; cont home regimen  5. Recent C diff atx associated; he has completed PO vanc; but will cont PO vanc while on IV atx for cellulitis -hold lactulose due to diarrhea   6. Anemia chronic; thrombocytopenia with liver cirrhosis; no s/s of obvious bleeding; cont monitoring    D/w patient, confirmed with his wife  -He is DNR   Hold lovenox for DVT prophylaxis due to high risk of bleeding; cont SCD   IR; if consultant consulted, please document name and whether formally or  informally consulted  Code Status: DNR (must indicate code status--if unknown or must be presumed, indicate so)  Family Communication: d/w patient, his wife at the bedside (indicate person spoken with, if applicable, with phone number if by telephone)  Disposition Plan: home pend clinical imrvement (indicate anticipated LOS)      Consultants:  Radiology  Procedures:  Pend IR embolization 4/8  Antibiotics:  vanc 4/5>>>>>>  Zosyn 4/5>>>>>   (indicate start date, and stop date if known)  HPI/Subjective: alert  Objective: Filed Vitals:   05/28/13 0536  BP: 143/59  Pulse: 54  Temp: 97.8 F (36.6 C)  Resp: 16    Intake/Output Summary (Last 24 hours) at 05/28/13 0921 Last data filed at 05/28/13 0537  Gross per 24 hour  Intake   1270 ml  Output   2125 ml  Net   -855 ml   Filed Weights   05/26/13 1430  Weight: 111.403 kg (245 lb 9.6 oz)    Exam:   General:  alert  Cardiovascular: s1,s2 rrr  Respiratory: CTA BL  Abdomen: soft, distended, nt  Musculoskeletal: R leg edema, erythema improving    Data Reviewed: Basic Metabolic Panel:  Recent Labs Lab 05/26/13 1215 05/28/13 0330  NA 140 136*  K 4.1 4.0  CL 100 97  CO2 28 28  GLUCOSE 185* 281*  BUN 21 15  CREATININE 1.18 1.15  CALCIUM 9.3 8.5   Liver Function Tests:  Recent Labs Lab 05/26/13 1215  AST 20  ALT 10  ALKPHOS 57  BILITOT 1.4*  PROT 6.4  ALBUMIN 3.3*   No results found for this basename: LIPASE, AMYLASE,  in the last 168 hours  Recent Labs Lab 05/26/13 1215  AMMONIA 29   CBC:  Recent Labs Lab 05/26/13 1215  WBC 5.0  NEUTROABS 3.8  HGB 10.7*  HCT 32.6*  MCV 98.8  PLT 37*   Cardiac Enzymes: No results found for this basename: CKTOTAL, CKMB, CKMBINDEX, TROPONINI,  in the last 168 hours BNP (last 3 results) No results found for this basename: PROBNP,  in the last 8760 hours CBG:  Recent Labs Lab 05/27/13 0719 05/27/13 1139 05/27/13 1702 05/27/13 2129  05/28/13 0730  GLUCAP 185* 298* 287* 291* 195*    Recent Results (from the past 240 hour(s))  CULTURE, BLOOD (ROUTINE X 2)     Status: None   Collection Time    05/26/13 12:05 PM      Result Value Ref Range Status   Specimen Description BLOOD BLOOD RIGHT FOREARM   Final   Special Requests BOTTLES DRAWN AEROBIC AND ANAEROBIC [facility] EACH   Final   Culture  Setup Time     Final   Value: 05/26/2013 18:35     Performed at [facility]   Culture     Final   Value:        BLOOD CULTURE RECEIVED NO GROWTH TO DATE CULTURE WILL BE HELD FOR 5 DAYS BEFORE ISSUING A FINAL NEGATIVE REPORT     Performed at [facility]   Report Status PENDING   Incomplete  CULTURE, BLOOD (ROUTINE X 2)     Status: None   Collection Time    05/26/13 12:15 PM      Result Value Ref Range Status   Specimen Description BLOOD RIGHT HAND   Final   Special Requests BOTTLES DRAWN AEROBIC ONLY 4CC   Final   Culture  Setup Time     Final   Value: 05/26/2013 18:35     Performed at [facility]   Culture     Final   Value:        BLOOD CULTURE RECEIVED NO GROWTH TO DATE CULTURE WILL BE HELD FOR 5 DAYS BEFORE ISSUING A FINAL NEGATIVE REPORT     Performed at [facility]   Report Status PENDING   Incomplete     Studies: No results found.  Scheduled Meds: . allopurinol  150 mg Oral q morning - 10a  . fentaNYL  100 mcg Transdermal Q72H  . ferrous gluconate  324 mg Oral BID WC  . furosemide  40 mg Oral BID WC  . gabapentin  300 mg Oral TID  . insulin aspart  0-9 Units Subcutaneous TID WC  . insulin glargine  10 Units Subcutaneous Q24H  . isosorbide mononitrate  60 mg Oral q morning - 10a  . lactulose  20 g Oral BID  . loratadine  10 mg Oral q morning - 10a  . metoprolol tartrate  12.5 mg Oral BID  . pantoprazole  40 mg Oral Daily  . piperacillin-tazobactam (ZOSYN)  IV  3.375 g Intravenous 3 times per day  . rifaximin  550 mg Oral BID  . saccharomyces boulardii  250 mg Oral BID  .  spironolactone  50 mg Oral Daily  . vancomycin  1,500 mg Intravenous Q24H  . vancomycin  125 mg Oral 4 times per day  . vitamin C  500 mg Oral Daily   Continuous Infusions:   Principal Problem:   Cellulitis Active Problems:   THROMBOCYTOPENIA, CHRONIC   HYPERTENSION, ESSENTIAL NOS   CIRRHOSIS, ALCOHOLIC  CORONARY ARTERY BYPASS GRAFT, HX OF   Congestive heart failure   DM (diabetes mellitus) with peripheral vascular complication    Time spent: >35 minutes     Kinnie Feil  [facility] Pager [phone]. If 7PM-7AM, please contact night-coverage at [email or web address], password [facility] 05/28/2013, 9:21 AM  LOS: 2 days

## 2013-05-29 ENCOUNTER — Encounter (HOSPITAL_COMMUNITY): Admission: RE | Admit: 2013-05-29 | Payer: Medicare Other | Source: Ambulatory Visit

## 2013-05-29 ENCOUNTER — Ambulatory Visit (HOSPITAL_COMMUNITY): Admit: 2013-05-29 | Payer: Medicare Other

## 2013-05-29 ENCOUNTER — Encounter (HOSPITAL_COMMUNITY)
Admission: RE | Admit: 2013-05-29 | Discharge: 2013-05-29 | Disposition: A | Discharge status: Home / Self Care | Payer: Medicare Other | Source: Ambulatory Visit | Attending: Interventional Radiology | Admitting: Interventional Radiology

## 2013-05-29 ENCOUNTER — Inpatient Hospital Stay (HOSPITAL_COMMUNITY): Payer: Medicare Other

## 2013-05-29 ENCOUNTER — Encounter (HOSPITAL_COMMUNITY): Payer: Self-pay

## 2013-05-29 DIAGNOSIS — C228 Malignant neoplasm of liver, primary, unspecified as to type: Secondary | ICD-10-CM | POA: Insufficient documentation

## 2013-05-29 DIAGNOSIS — C220 Liver cell carcinoma: Secondary | ICD-10-CM

## 2013-05-29 LAB — COMPREHENSIVE METABOLIC PANEL WITH GFR
ALT: 8 U/L (ref 0–53)
AST: 18 U/L (ref 0–37)
Albumin: 2.8 g/dL — ABNORMAL LOW (ref 3.5–5.2)
Alkaline Phosphatase: 52 U/L (ref 39–117)
BUN: 13 mg/dL (ref 6–23)
CO2: 29 meq/L (ref 19–32)
Calcium: 8.9 mg/dL (ref 8.4–10.5)
Chloride: 96 meq/L (ref 96–112)
Creatinine, Ser: 1.15 mg/dL (ref 0.50–1.35)
GFR calc Af Amer: 70 mL/min — ABNORMAL LOW
GFR calc non Af Amer: 60 mL/min — ABNORMAL LOW
Glucose, Bld: 255 mg/dL — ABNORMAL HIGH (ref 70–99)
Potassium: 3.8 meq/L (ref 3.7–5.3)
Sodium: 136 meq/L — ABNORMAL LOW (ref 137–147)
Total Bilirubin: 1 mg/dL (ref 0.3–1.2)
Total Protein: 5.5 g/dL — ABNORMAL LOW (ref 6.0–8.3)

## 2013-05-29 LAB — CBC
HCT: 30.6 % — ABNORMAL LOW (ref 39.0–52.0)
Hemoglobin: 10.4 g/dL — ABNORMAL LOW (ref 13.0–17.0)
MCH: 32.4 pg (ref 26.0–34.0)
MCHC: 34 g/dL (ref 30.0–36.0)
MCV: 95.3 fL (ref 78.0–100.0)
Platelets: 52 10*3/uL — ABNORMAL LOW (ref 150–400)
RBC: 3.21 MIL/uL — ABNORMAL LOW (ref 4.22–5.81)
RDW: 14.2 % (ref 11.5–15.5)
WBC: 3.8 10*3/uL — ABNORMAL LOW (ref 4.0–10.5)

## 2013-05-29 LAB — GLUCOSE, CAPILLARY
GLUCOSE-CAPILLARY: 195 mg/dL — AB (ref 70–99)
GLUCOSE-CAPILLARY: 295 mg/dL — AB (ref 70–99)
Glucose-Capillary: 205 mg/dL — ABNORMAL HIGH (ref 70–99)
Glucose-Capillary: 400 mg/dL — ABNORMAL HIGH (ref 70–99)
Glucose-Capillary: 395 mg/dL — ABNORMAL HIGH (ref 70–99)

## 2013-05-29 LAB — PROTIME-INR
INR: 1.33 (ref 0.00–1.49)
Prothrombin Time: 16.2 seconds — ABNORMAL HIGH (ref 11.6–15.2)

## 2013-05-29 MED ORDER — MIDAZOLAM HCL 5 MG/5ML IJ SOLN
INTRAMUSCULAR | Status: AC | PRN
  Administered 2013-05-29: 0.5 mg via INTRAVENOUS

## 2013-05-29 MED ORDER — PENICILLIN V POTASSIUM 250 MG PO TABS
250.0000 mg | ORAL_TABLET | Freq: Every day | ORAL | Status: DC
  Administered 2013-05-29 – 2013-05-30 (×2): 250 mg via ORAL
  Filled 2013-05-29 (×2): qty 1

## 2013-05-29 MED ORDER — HYDROCODONE-ACETAMINOPHEN 5-325 MG PO TABS
1.0000 | ORAL_TABLET | ORAL | Status: DC | PRN
  Administered 2013-05-29 – 2013-05-30 (×3): 2 via ORAL
  Filled 2013-05-29 (×3): qty 2

## 2013-05-29 MED ORDER — INSULIN ASPART 100 UNIT/ML ~~LOC~~ SOLN
7.0000 [IU] | Freq: Once | SUBCUTANEOUS | Status: AC
  Administered 2013-05-29: 7 [IU] via SUBCUTANEOUS

## 2013-05-29 MED ORDER — MIDAZOLAM HCL 2 MG/2ML IJ SOLN
INTRAMUSCULAR | Status: AC | PRN
  Administered 2013-05-29: 0.5 mg via INTRAVENOUS

## 2013-05-29 MED ORDER — LIDOCAINE HCL 1 % IJ SOLN
INTRAMUSCULAR | Status: AC
  Filled 2013-05-29: qty 20

## 2013-05-29 MED ORDER — FENTANYL CITRATE 0.05 MG/ML IJ SOLN
INTRAMUSCULAR | Status: AC | PRN
  Administered 2013-05-29 (×2): 25 ug via INTRAVENOUS

## 2013-05-29 NOTE — Progress Notes (Signed)
[facility] PROGRESS NOTE  [name] ZSW:[id number] DOB: 09-Aug-1935 DOA: 05/26/2013 PCP: Unice Cobble, MD    Brief HPI 78 y.o. male with PMH of HTN, CAD s/p CABG, IDDM, CHF, HCC, alcoholic liver cirrhosis, recurrent R leg cellulitis presented with worsening R leg erythema, edema, pain for several days is admitted with recurrent R leg cellulitis    Assessment/Plan: 1. Recurrent R leg cellulitis due to Venous insufficiency; exam R leg worsened cellulitis; [place] neg for DVT; patient is on chronic penicillin PO per ID; recently he was on PO doxycyline  -Much improved on IV antibiotics (Vanc/Zosyn). Will plan on DC IV antibiotics and resuming chronic suppressive dose of penicillin in anticipation of DC in am.  2. Alcoholic liver cirrhosis, HCC complicated with coagulopathy, thrombocytopenia, GIB s/p TIPS, s/p radio embolization (3/16)  -Had embolization by IR today without complications.  3. IDDM, per wife patient is having hypoglycemia 39's with confusion; home regimen novolog 12-11-38 mealtime;  -HA1C-6.9 (03/2013); restarted meal time scheduled insulin 5, +ISS  Added lantus 15 units; titrate per response   4. CAD s/p CABG; chronic CHF; echo (2014), LVEF 60%, pulmonary HTN, dilated cardiomyopathy  -clinically euvomelic; cont home regimen   5. Recent C diff atx associated; he has completed PO vanc; but will cont PO vanc while on IV atx for cellulitis. DC PO vanc today. -hold lactulose due to diarrhea    6. Anemia chronic; thrombocytopenia with liver cirrhosis; no s/s of obvious bleeding; cont monitoring    Hold lovenox for DVT prophylaxis due to high risk of bleeding; cont SCD    Code Status: DNR  Family Communication: d/w patient, his wife, [name], at the bedside  Disposition Plan: likely home in 24 hours.     Consultants:  Radiology  Procedures:  IR embolization 4/8  Antibiotics:  vanc 4/5>>>>>>4/8  Zosyn 4/5>>>>>4/8   HPI/Subjective: Alert, no complaints.  Hungry.  Objective: Filed Vitals:   05/29/13 1244  BP: 149/67  Pulse: 89  Temp: 97.8 F (36.6 C)  Resp: 17    Intake/Output Summary (Last 24 hours) at 05/29/13 1307 Last data filed at 05/29/13 3220  Gross per 24 hour  Intake   1520 ml  Output   1850 ml  Net   -330 ml   Filed Weights   05/26/13 1430 05/29/13 0618  Weight: 111.403 kg (245 lb 9.6 oz) 111.131 kg (245 lb)    Exam:   General:  alert  Cardiovascular: s1,s2 rrr  Respiratory: CTA BL  Abdomen: soft, distended, nt  Musculoskeletal: R leg edema, erythema improving    Data Reviewed: Basic Metabolic Panel:  Recent Labs Lab 05/26/13 1215 05/28/13 0330 05/29/13 0334  NA 140 136* 136*  K 4.1 4.0 3.8  CL 100 97 96  CO2 28 28 29   GLUCOSE 185* 281* 255*  BUN 21 15 13   CREATININE 1.18 1.15 1.15  CALCIUM 9.3 8.5 8.9   Liver Function Tests:  Recent Labs Lab 05/26/13 1215 05/29/13 0334  AST 20 18  ALT 10 8  ALKPHOS 57 52  BILITOT 1.4* 1.0  PROT 6.4 5.5*  ALBUMIN 3.3* 2.8*   No results found for this basename: LIPASE, AMYLASE,  in the last 168 hours  Recent Labs Lab 05/26/13 1215  AMMONIA 29   CBC:  Recent Labs Lab 05/26/13 1215 05/28/13 0330 05/29/13 0334  WBC 5.0 3.9* 3.8*  NEUTROABS 3.8  --   --   HGB 10.7* 10.3* 10.4*  HCT 32.6* 30.6* 30.6*  MCV 98.8 97.1 95.3  PLT 37* 50* 52*   Cardiac Enzymes: No results found for this basename: CKTOTAL, CKMB, CKMBINDEX, TROPONINI,  in the last 168 hours BNP (last 3 results) No results found for this basename: PROBNP,  in the last 8760 hours CBG:  Recent Labs Lab 05/28/13 1201 05/28/13 1717 05/28/13 2137 05/29/13 0753 05/29/13 1241  GLUCAP 288* 283* 205* 205* 195*    Recent Results (from the past 240 hour(s))  CULTURE, BLOOD (ROUTINE X 2)     Status: None   Collection Time    05/26/13 12:05 PM      Result Value Ref Range Status   Specimen Description BLOOD BLOOD RIGHT FOREARM   Final   Special Requests BOTTLES DRAWN AEROBIC  AND ANAEROBIC [facility] EACH   Final   Culture  Setup Time     Final   Value: 05/26/2013 18:35     Performed at [facility]   Culture     Final   Value:        BLOOD CULTURE RECEIVED NO GROWTH TO DATE CULTURE WILL BE HELD FOR 5 DAYS BEFORE ISSUING A FINAL NEGATIVE REPORT     Performed at [facility]   Report Status PENDING   Incomplete  CULTURE, BLOOD (ROUTINE X 2)     Status: None   Collection Time    05/26/13 12:15 PM      Result Value Ref Range Status   Specimen Description BLOOD RIGHT HAND   Final   Special Requests BOTTLES DRAWN AEROBIC ONLY 4CC   Final   Culture  Setup Time     Final   Value: 05/26/2013 18:35     Performed at [facility]   Culture     Final   Value:        BLOOD CULTURE RECEIVED NO GROWTH TO DATE CULTURE WILL BE HELD FOR 5 DAYS BEFORE ISSUING A FINAL NEGATIVE REPORT     Performed at [facility]   Report Status PENDING   Incomplete     Studies: No results found.  Scheduled Meds: . allopurinol  150 mg Oral q morning - 10a  . fentaNYL  100 mcg Transdermal Q72H  . ferrous gluconate  324 mg Oral BID WC  . furosemide  40 mg Oral BID WC  . gabapentin  300 mg Oral TID  . insulin aspart  0-9 Units Subcutaneous TID WC  . insulin aspart  5 Units Subcutaneous TID WC  . insulin glargine  15 Units Subcutaneous Q24H  . isosorbide mononitrate  60 mg Oral q morning - 10a  . lidocaine      . loratadine  10 mg Oral q morning - 10a  . metoprolol tartrate  12.5 mg Oral BID  . [START ON 05/30/2013] pantoprazole  40 mg Oral Daily  . penicillin v potassium  250 mg Oral Daily  . rifaximin  550 mg Oral BID  . saccharomyces boulardii  250 mg Oral BID  . spironolactone  50 mg Oral Daily  . vancomycin  125 mg Oral 4 times per day  . vitamin C  500 mg Oral Daily   Continuous Infusions:   Principal Problem:   Cellulitis Active Problems:   THROMBOCYTOPENIA, CHRONIC   HYPERTENSION, ESSENTIAL NOS   CIRRHOSIS, ALCOHOLIC   CORONARY ARTERY BYPASS  GRAFT, HX OF   Congestive heart failure   DM (diabetes mellitus) with peripheral vascular complication    Time spent: 35 minutes     [place]  [facility] Pager [phone]. If 7PM-7AM, please contact night-coverage at [email or web address], password [facility] 05/29/2013, 1:07 PM  LOS: 3 days

## 2013-05-29 NOTE — Procedures (Signed)
Y90 radioemboliztion R hepatic artery No complication No blood loss. See complete dictation in [facility].

## 2013-05-29 NOTE — Sedation Documentation (Signed)
Gauze/tegaderm bandage applied to R fem artery puncture.  CDI, level 0.

## 2013-05-29 NOTE — Sedation Documentation (Signed)
Post Y-90 imaging complete.  Pt transported to 1312 via bed with RN.

## 2013-05-29 NOTE — Progress Notes (Signed)
Inpatient Diabetes Program Recommendations  AACE/ADA: New Consensus Statement on Inpatient Glycemic Control (2013)  Target Ranges:  Prepandial:   less than 140 mg/dL      Peak postprandial:   less than 180 mg/dL (1-2 hours)      Critically ill patients:  140 - 180 mg/dL   Reason for Assessment: Hyperglycemia  Diabetes history: DM2 Outpatient Diabetes medications: Novolog 20-10-40 Current orders for Inpatient glycemic control: Lantus 15 QHS, Novolog sensitive tidwc and 5 units tidwc for meal coverage Dexamethazone 20 mg on 4/8.  Recommendations: Increase Lantus to 20 units QHS Increase Novolog to 8 units tidwc Increase meal coverage insulin to Novolog moderate tidwc and hs.  Will continue to follow. Thank you. Lorenda Peck, RD, LDN, CDE Inpatient Diabetes Coordinator [phone]

## 2013-05-29 NOTE — Sedation Documentation (Signed)
5Fr sheath removed from R femoral artery by Dr. Vernard Gambles.  Hemostasis achieved using Exoseal device.  R groin level 0.

## 2013-05-30 ENCOUNTER — Telehealth: Payer: Self-pay | Admitting: Endocrinology

## 2013-05-30 ENCOUNTER — Other Ambulatory Visit: Payer: Self-pay | Admitting: Radiology

## 2013-05-30 DIAGNOSIS — C220 Liver cell carcinoma: Secondary | ICD-10-CM

## 2013-05-30 LAB — CBC
HCT: 31.2 % — ABNORMAL LOW (ref 39.0–52.0)
Hemoglobin: 10.2 g/dL — ABNORMAL LOW (ref 13.0–17.0)
MCH: 31.2 pg (ref 26.0–34.0)
MCHC: 32.7 g/dL (ref 30.0–36.0)
MCV: 95.4 fL (ref 78.0–100.0)
Platelets: 56 10*3/uL — ABNORMAL LOW (ref 150–400)
RBC: 3.27 MIL/uL — ABNORMAL LOW (ref 4.22–5.81)
RDW: 13.8 % (ref 11.5–15.5)
WBC: 5.1 10*3/uL (ref 4.0–10.5)

## 2013-05-30 LAB — PREPARE PLATELET PHERESIS
Unit division: 0
Unit division: 0

## 2013-05-30 LAB — BASIC METABOLIC PANEL
BUN: 23 mg/dL (ref 6–23)
CO2: 29 mEq/L (ref 19–32)
CREATININE: 1.24 mg/dL (ref 0.50–1.35)
Calcium: 9.2 mg/dL (ref 8.4–10.5)
Chloride: 97 mEq/L (ref 96–112)
GFR calc Af Amer: 64 mL/min — ABNORMAL LOW (ref >90)
GFR, EST NON AFRICAN AMERICAN: 55 mL/min — AB (ref >90)
Glucose, Bld: 354 mg/dL — ABNORMAL HIGH (ref 70–99)
Potassium: 4.8 mEq/L (ref 3.7–5.3)
Sodium: 137 mEq/L (ref 137–147)

## 2013-05-30 LAB — GLUCOSE, CAPILLARY
GLUCOSE-CAPILLARY: 311 mg/dL — AB (ref 70–99)
Glucose-Capillary: 355 mg/dL — ABNORMAL HIGH (ref 70–99)
Glucose-Capillary: 284 mg/dL — ABNORMAL HIGH (ref 70–99)

## 2013-05-30 MED ORDER — INSULIN GLARGINE 100 UNIT/ML ~~LOC~~ SOLN: 20.0000 [IU] | Freq: Every day | SUBCUTANEOUS | Status: DC

## 2013-05-30 MED ORDER — HYDROCODONE-ACETAMINOPHEN 5-325 MG PO TABS: 1.0000 | ORAL_TABLET | ORAL | Status: DC | PRN

## 2013-05-30 NOTE — Telephone Encounter (Signed)
Noted, pt's wife informed of insulin dosage.

## 2013-05-30 NOTE — Discharge Summary (Signed)
Physician Discharge Summary  [name] JZP:[id number] DOB: [birth year] DOA: 05/26/2013  PCP: Unice Cobble, MD  Admit date: 05/26/2013 Discharge date: 05/30/2013  Time spent: 45 minutes  Recommendations for Outpatient Follow-up:  -Will be discharged home today in stable condition. -Advised to follow up with PCP in 1 week.   Discharge Diagnoses:  Principal Problem:   Cellulitis Active Problems:   THROMBOCYTOPENIA, CHRONIC   HYPERTENSION, ESSENTIAL NOS   CIRRHOSIS, ALCOHOLIC   CORONARY ARTERY BYPASS GRAFT, HX OF   Congestive heart failure   DM (diabetes mellitus) with peripheral vascular complication   Discharge Condition: Stable and improved  Filed Weights   05/26/13 1430 05/29/13 0618  Weight: 111.403 kg (245 lb 9.6 oz) 111.131 kg (245 lb)    History of present illness:  [name] is a 78 y.o. male with PMH of HTN, CAD s/p CABG, IDDM, CHF, HCC, alcoholic liver cirrhosis, recurrent R leg cellulitis presented with worsening R leg erythema, edema, pain for several days; patient had recurrent R leg cellulitis with associated intermittent sepsis; last time he was admitted with cellulitis on 3/09 treated with IV atx later complicated with c diff requiring PO vanc for 14 days and discharged home 3./15/15; patient improved but for the last several days noticed again worsening R cellulitis, associate with chills, pain, but no fever; ED started IV atx, ordered blood cultures and hospiatlist called for evaluation    Hospital Course:   1. Recurrent R leg cellulitis due to Venous insufficiency; exam R leg worsened cellulitis; [place] neg for DVT; patient is on chronic penicillin PO per ID; recently he was on PO doxycyline  -Much improved on IV antibiotics (Vanc/Zosyn). Will plan on DC IV antibiotics and resuming chronic suppressive dose of penicillin.  2. Alcoholic liver cirrhosis, HCC complicated with coagulopathy, thrombocytopenia, GIB s/p TIPS, s/p radio embolization (3/16)  -Had  embolization by IR 4/8 without complications.   3. IDDM,  CBGs have remained quite elevated. -Will DC on lantus and SSI as she was doing prior to admission. -Will need to see PCP/endocrine for insulin adjustment within 2 weeks.  4. CAD s/p CABG; chronic CHF; echo (2014), LVEF 60%, pulmonary HTN, dilated cardiomyopathy  -clinically euvomelic; cont home regimen   5. Recent C diff atx associated; he has completed PO vanc; but will cont PO vanc while on IV atx for cellulitis. DC PO vanc today (4/8).   6. Anemia chronic; thrombocytopenia with liver cirrhosis; no s/s of obvious bleeding; cont monitoring      Procedures:  IR radioembolization   Consultations:  IR  Discharge Instructions  Discharge Orders   Future Appointments Provider Department Dept Phone   06/03/2013 9:15 AM Renato Shin, MD [facility] [phone]   06/05/2013 1:00 PM [facility] Only [facility] [phone]   06/05/2013 1:30 PM [facility] [phone]   06/06/2013 9:15 AM Trudie Buckler, [facility] [place]   07/10/2013 10:45 AM Gatha Mayer, MD [facility] [phone]   07/22/2013 10:30 AM Campbell Riches, MD [facility] [phone]   Future Orders Complete By Expires   Diet Carb Modified  As directed    Discontinue IV  As directed    Increase activity slowly  As directed        Medication List         acetaminophen 500 MG tablet  Commonly known  as:  TYLENOL  Take 1,000 mg by mouth every 6 (six) hours as needed for mild pain.     allopurinol 300 MG tablet  Commonly known as:  ZYLOPRIM  Take 150 mg by mouth every morning.     CALCIUM/MAGNESIUM/ZINC FORMULA 1000-500-50 MG Tabs  Generic drug:  Calcium-Magnesium-Zinc  Take 1 tablet by mouth every morning.     fentaNYL 100 MCG/HR  Commonly  known as:  DURAGESIC - dosed mcg/hr  Place 100 mcg onto the skin every 3 (three) days.     furosemide 40 MG tablet  Commonly known as:  LASIX  Take 1 tablet (40 mg total) by mouth 2 (two) times daily.     gabapentin 300 MG capsule  Commonly known as:  NEURONTIN  Take 1 capsule (300 mg total) by mouth 3 (three) times daily.     glucose blood test strip  Commonly known as:  FREESTYLE LITE  1 each by Other route 2 (two) times daily. And lancets: Dx code 250.01     HYDROcodone-acetaminophen 5-325 MG per tablet  Commonly known as:  NORCO/VICODIN  Take 1-2 tablets by mouth every 4 (four) hours as needed for moderate pain.     insulin aspart 100 UNIT/ML injection  Commonly known as:  novoLOG  Inject 10-40 Units into the skin 3 (three) times daily before meals. 20 units with breakfast, 10 units with lunch and 40 units with the evening meal.     insulin glargine 100 UNIT/ML injection  Commonly known as:  LANTUS  Inject 0.2 mLs (20 Units total) into the skin at bedtime.     INSULIN SYRINGE .5CC/31GX5/16" 31G X 5/16" 0.5 ML Misc  Use one syringe 4 times daily. Dx code 250.01     Iron 240 (27 FE) MG Tabs  Take 1 tablet by mouth 2 (two) times daily.     isosorbide mononitrate 60 MG 24 hr tablet  Commonly known as:  IMDUR  Take 60 mg by mouth every morning.     lactulose 10 GM/15ML solution  Commonly known as:  CHRONULAC  Take 30 mLs (20 g total) by mouth 2 (two) times daily.     loratadine 10 MG tablet  Commonly known as:  CLARITIN  Take 10 mg by mouth every morning.     metoprolol tartrate 25 MG tablet  Commonly known as:  LOPRESSOR  Take 0.5 tablets (12.5 mg total) by mouth 2 (two) times daily.     nitroGLYCERIN 0.4 MG SL tablet  Commonly known as:  NITROSTAT  Place 1 tablet (0.4 mg total) under the tongue every 5 (five) minutes as needed for chest pain.     omeprazole 20 MG tablet  Commonly known as:  PRILOSEC OTC  Take 20 mg by mouth daily.     ondansetron 8 MG tablet    Commonly known as:  ZOFRAN  Take 1 tablet (8 mg total) by mouth every 12 (twelve) hours as needed for nausea or vomiting.     penicillin v potassium 250 MG tablet  Commonly known as:  VEETID  Take 250 mg by mouth daily.     polyethylene glycol packet  Commonly known as:  MIRALAX / GLYCOLAX  Take 17 g by mouth daily as needed. Take two or three times a week     predniSONE 5 MG tablet  Commonly known as:  DELTASONE  Take 5 mg by mouth daily.     rifaximin 550 MG Tabs tablet  Commonly known as:  Doreene Nest  Take 550 mg by mouth 2 (two) times daily.     saccharomyces boulardii 250 MG capsule  Commonly known as:  FLORASTOR  Take 1 capsule (250 mg total) by mouth 2 (two) times daily.     spironolactone 50 MG tablet  Commonly known as:  ALDACTONE  Take 1 tablet (50 mg total) by mouth daily.     vitamin B-1 250 MG tablet  Take 125 mg by mouth daily.     vitamin C 500 MG tablet  Commonly known as:  ASCORBIC ACID  Take 500 mg by mouth daily.       Allergies  Allergen Reactions  . Shellfish-Derived Products     Rash & angioedema  . Doxycycline Hyclate     Diarrhea   . Amoxicillin-Pot Clavulanate     Unsure what reaction was but pt tolerated penicillin 05/25/2012  . Aspirin     REACTION: unable to tolerate due to cirrhosis and varices  . Buprenorphine Hcl-Naloxone Hcl Other (See Comments)    unresponsive  . Codeine     Rash    . Ezetimibe     REACTION: LEG/JOINT PAIN  . Hydromorphone Hcl     unknown  . Meperidine Hcl     unknown  . Morphine     unknown  . Morphine Sulfate     REACTION: unspecified  . Oxycodone     hallucinations  . Sulfonamide Derivatives       The results of significant diagnostics from this hospitalization (including imaging, microbiology, ancillary and laboratory) are listed below for reference.    Significant Diagnostic Studies: Nm Liver Img Spect  05/29/2013   CLINICAL DATA:  Y - 90 microspheres therapy for unresectable multifocal  hepatoma  EXAM: NUCLEAR MEDICINE LIVER SCAN SPECT  TECHNIQUE: Abdominal images were obtained in multiple projections after intravenous injection of radiopharmaceutical.  RADIOPHARMACEUTICALS:  43.8 MCI Y90 MICROSPHERES  COMPARISON:  05/06/13.  FINDINGS: Bremsstrahlung planar and SPECT imaging of the abdomen following intrahepatic arterial delivery of Y-90 microsphere demonstrates radioactivity localized to the left hepatic lobe. No evidence of extrahepatic activity.  IMPRESSION: Bremssstrahlung scan demonstrates activity localized to the liver with no extrahepatic activity identified.   Electronically Signed   By: Kerby Moors M.D.   On: 05/29/2013 17:12   Nm Liver Img Spect  05/06/2013   EXAM: NUCLEAR MEDICINE LIVER SCAN with SPECT  TECHNIQUE: Abdominal images were obtained in multiple projections after intravenous injection of radiopharmaceutical. Post processing fusion with CT performed.  RADIOPHARMACEUTICALS:  3.3MILLI CURIE MAA TECHNETIUM TO 41M ALBUMIN AGGREGATED  COMPARISON:  IR ANGIO/VISCERAL dated 05/06/2013; CT ABDOMEN W/O CM dated 03/21/2013  FINDINGS: Intra-arterial injected MAA localizes to the liver. There is a curvilinear focus of activity extending anterior to the right hepatic lobe which may represent a vascular structure. No evidence of bowel or stomach uptake.  Lung shunt fraction equals 3.2%.  IMPRESSION: 1. Curvilinear activity anterior to the right hepatic lobe may represent a vascular structure such as a falciform ligament artery. 2. Lung shunt fraction equals 3.2%. Findings conveyed toDAYNE HASSELL III on 05/06/2013  at17:52.   Electronically Signed   By: Suzy Bouchard M.D.   On: 05/06/2013 17:54   Ir Angiogram Visceral Selective  05/29/2013   CLINICAL DATA:  Cirrhosis, post TIPS. Multifocal hepatocellular carcinoma.  FLUOROSCOPY TIME:  10 min 42 seconds  TECHNIQUE: ULTRASOUND GUIDANCE FOR VASCULAR ACCESS  SELECTIVE LEFT HEPATIC ARTERY PERIPHERAL CATHETERIZATION,  PRETREATMENT  ANGIOGRAM, Y 90 RADIOEMBOLIZATION THERAPY AND POST  EMBOLIZATION ANGIOGRAMS  Technique and findings: Informed consent was obtained from the patient following explanation of the procedure, risks, benefits and alternatives.  The patient understands, agrees and consents for the procedure. All questions were addressed. A time out was performed.Patient was receiving adequate preprocedure antibiotic coverage. Maximal barrier sterile technique utilized including caps, mask, sterile gowns, sterile gloves, large sterile drape, hand hygiene, and betadine.  Intravenous Fentanyl and Versed were administered as conscious sedation during continuous cardiorespiratory monitoring by the radiology RN, with a total moderate sedation time of 60 minutes.  An ice pack was placed in the epigastric region in the distribution of the falciform artery. Under sterile conditions and local anesthesia, 21 gauge ultrasound micropuncture kit access performed of the right common femoral artery under real time ultrasound guidance. Ultrasound documentation was stored. Over a a Benson guide wire, 5-French sheath was inserted. A 5- French C2 catheter used to selectively catheterize the common hepatic artery for selective arteriography. This demonstrated stable hepatic arterial anatomy with no visceral branches identified. Stable coil embolization and occlusion of the gastroduodenal and right hepatic arteries. A high-flow micro catheter was coaxially advanced with the aid of a microguidewire to the proximal right hepatic artery for selective arteriography. No visceral branches were evident. There is no significant reflux. There was good antegrade flow. From this location, the Y 90 dose calculated by Authorized User Dr. Clovis Riley was instilled into the right hepatic artery with good antegrade flow. Follow-up selective arteriogram through the microcatheter demonstrates continued good antegrade flow through the right hepatic artery. Following embolization, the  micro catheter and a C2 catheter were removed safely.  Hemostasis was obtained at the common femoral artery access site with Exoseal device after confirmatory femoral arteriogram. No immediate complication. Distal pedal pulses remained normal. The patient tolerated the treatment well.  IMPRESSION: Successful right hepatic artery Y 90 radioembolization for Multifocal hepatocellular carcinoma.  A separate nuclear medicine report will be generated to document dosing and post treatment Bremsstrahlung scan.   Electronically Signed   By: Arne Cleveland M.D.   On: 05/29/2013 14:40   Ir Angiogram Visceral Selective  05/06/2013   CLINICAL DATA:  Cirrhosis, post TIPS. Multifocal hepatoma. Preop planning for Y 90 radio embolization.  EXAM: ADDITIONAL ARTERIOGRAPHY; SELECTIVE VISCERAL ARTERIOGRAPHY; IR ULTRASOUND GUIDANCE VASC ACCESS RIGHT; IR EMBO TUMOR ORGAN ISCHEMIA INFARCT INC GUIDE ROADMAPPING  MEDICATIONS: Intravenous Fentanyl and Versed were administered as conscious sedation during continuous cardiorespiratory monitoring by the radiology RN, with a total moderate sedation time of 100 minutes.  CONTRAST:  178m OMNIPAQUE IOHEXOL 300 MG/ML  SOLN  FLUOROSCOPY TIME:  20 min 24 seconds  PROCEDURE: The procedure, risks (including but not limited to bleeding, infection, organ damage ), benefits, and alternatives were explained to the patient. Questions regarding the procedure were encouraged and answered. The patient understands and consents to the procedure.  Right femoral region prepped and draped in usual sterile fashion. Maximal barrier sterile technique was utilized including caps, mask, sterile gowns, sterile gloves, sterile drape, hand hygiene and skin antiseptic. After the skin was infiltrated with 1% lidocaine, the right common femoral artery was accessed with a 21 gauge micropuncture needle under real-time ultrasound guidance. Needle exchanged over 018 guidewire for transitional dilator. This allowed passage of a  Bentson wire into the abdominal aorta. Over this, a 6 [place] sheath was placed, through which a 5 [place] catheter was advanced and used to selectively catheterize the superior mesenteric artery for selective arteriography. The C2 was then utilized to selectively catheterize the celiac  axis for selective arteriography. The C2 catheter was then advanced into the common hepatic artery for selective arteriography. The C2 catheter was then advanced into the proximal gastroduodenal artery. A renegade micro catheter was coaxially advanced and coil embolization of the gastroduodenal artery performed with 5 and 6 mm interlock coils. The renegade micro catheter was then used to selectively catheterize the left hepatic artery for selective arteriography. The renegade micro catheter was withdrawn, and the C2 advanced to selectively catheterize the right hepatic artery for selective arteriography. The C2 was then pulled back into the common hepatic artery for follow-up arteriography of the embolization. After this, this renegade micro catheter was used to selectively catheterize the gastroduodenal artery for additional embolization using a 4 and 5 coils. The renegade micro catheter was then advanced into the Right gastric artery for coil embolization using 3 mm coils. After followup arteriography through the C2 catheter, additional coil embolization of the gastroduodenal artery and right gastric artery performed. After final follow-up arteriography, the renegade micro catheter was advanced into the proper hepatic artery just proximal to its bifurcation for delivery of theTc14mAA test dose to the hepatic arterial bed. The micro catheter and C2 catheter were withdrawn. After confirmatory femoral arteriography, sheath was removed and hemostasis achieved with the aid of the Exoseal device. The patient tolerated the procedure well.  Complications: None  FINDINGS: Superior mesenteric arteriography shows the vessel to be  widely patent, with no significant supply to the hepatic arterial tree.  Celiac arteriography demonstrates widely patent common hepatic and splenic arteries. There is a large gastroduodenal artery supplied by the common hepatic artery. The right gastric artery originates from the proximal left hepatic artery. There is good antegrade flow into the left and right hepatic artery is and intrahepatic branches. Minimal tumor staining is evident. Patency of the TIPS stent not assessed on these arterial injections. The gastroduodenal artery was embolized using multiple coils as above. The right gastric artery was embolized using multiple coils as above. The results of the MAA test bolus to the hepatic arterial tree will be reported separately.  IMPRESSION: 1. Mesenteric anatomy amenable to Y-90 radio embolization, post coil embolization of right gastric and gastroduodenal arteries. 2. Results of Tc96mA test injection to the hepatic arterial tree to assess shunt function will be reported separately.   Electronically Signed   By: DaArne Cleveland.D.   On: 05/06/2013 16:44   Ir Angiogram Visceral Selective  05/06/2013   CLINICAL DATA:  Cirrhosis, post TIPS. Multifocal hepatoma. Preop planning for Y 90 radio embolization.  EXAM: ADDITIONAL ARTERIOGRAPHY; SELECTIVE VISCERAL ARTERIOGRAPHY; IR ULTRASOUND GUIDANCE VASC ACCESS RIGHT; IR EMBO TUMOR ORGAN ISCHEMIA INFARCT INC GUIDE ROADMAPPING  MEDICATIONS: Intravenous Fentanyl and Versed were administered as conscious sedation during continuous cardiorespiratory monitoring by the radiology RN, with a total moderate sedation time of 100 minutes.  CONTRAST:  10760mMNIPAQUE IOHEXOL 300 MG/ML  SOLN  FLUOROSCOPY TIME:  20 min 24 seconds  PROCEDURE: The procedure, risks (including but not limited to bleeding, infection, organ damage ), benefits, and alternatives were explained to the patient. Questions regarding the procedure were encouraged and answered. The patient understands and  consents to the procedure.  Right femoral region prepped and draped in usual sterile fashion. Maximal barrier sterile technique was utilized including caps, mask, sterile gowns, sterile gloves, sterile drape, hand hygiene and skin antiseptic. After the skin was infiltrated with 1% lidocaine, the right common femoral artery was accessed with a 21 gauge micropuncture needle under real-time ultrasound guidance. Needle  exchanged over 018 guidewire for transitional dilator. This allowed passage of a Bentson wire into the abdominal aorta. Over this, a 6 [place] vascular sheath was placed, through which a 5 [place] C2 catheter was advanced and used to selectively catheterize the superior mesenteric artery for selective arteriography. The C2 was then utilized to selectively catheterize the celiac axis for selective arteriography. The C2 catheter was then advanced into the common hepatic artery for selective arteriography. The C2 catheter was then advanced into the proximal gastroduodenal artery. A renegade micro catheter was coaxially advanced and coil embolization of the gastroduodenal artery performed with 5 and 6 mm interlock coils. The renegade micro catheter was then used to selectively catheterize the left hepatic artery for selective arteriography. The renegade micro catheter was withdrawn, and the C2 advanced to selectively catheterize the right hepatic artery for selective arteriography. The C2 was then pulled back into the common hepatic artery for follow-up arteriography of the embolization. After this, this renegade micro catheter was used to selectively catheterize the gastroduodenal artery for additional embolization using a 4 and 5 coils. The renegade micro catheter was then advanced into the Right gastric artery for coil embolization using 3 mm coils. After followup arteriography through the C2 catheter, additional coil embolization of the gastroduodenal artery and right gastric artery performed. After final  follow-up arteriography, the renegade micro catheter was advanced into the proper hepatic artery just proximal to its bifurcation for delivery of theTc60mAA test dose to the hepatic arterial bed. The micro catheter and C2 catheter were withdrawn. After confirmatory femoral arteriography, sheath was removed and hemostasis achieved with the aid of the Exoseal device. The patient tolerated the procedure well.  Complications: None  FINDINGS: Superior mesenteric arteriography shows the vessel to be widely patent, with no significant supply to the hepatic arterial tree.  Celiac arteriography demonstrates widely patent common hepatic and splenic arteries. There is a large gastroduodenal artery supplied by the common hepatic artery. The right gastric artery originates from the proximal left hepatic artery. There is good antegrade flow into the left and right hepatic artery is and intrahepatic branches. Minimal tumor staining is evident. Patency of the TIPS stent not assessed on these arterial injections. The gastroduodenal artery was embolized using multiple coils as above. The right gastric artery was embolized using multiple coils as above. The results of the MAA test bolus to the hepatic arterial tree will be reported separately.  IMPRESSION: 1. Mesenteric anatomy amenable to Y-90 radio embolization, post coil embolization of right gastric and gastroduodenal arteries. 2. Results of Tc919mA test injection to the hepatic arterial tree to assess shunt function will be reported separately.   Electronically Signed   By: DaArne Cleveland.D.   On: 05/06/2013 16:44   Ir Angiogram Selective Each Additional Vessel  05/29/2013   CLINICAL DATA:  Cirrhosis, post TIPS. Multifocal hepatocellular carcinoma.  FLUOROSCOPY TIME:  10 min 42 seconds  TECHNIQUE: ULTRASOUND GUIDANCE FOR VASCULAR ACCESS  SELECTIVE LEFT HEPATIC ARTERY PERIPHERAL CATHETERIZATION,  PRETREATMENT ANGIOGRAM, Y 90 RADIOEMBOLIZATION THERAPY AND POST  EMBOLIZATION  ANGIOGRAMS  Technique and findings: Informed consent was obtained from the patient following explanation of the procedure, risks, benefits and alternatives.  The patient understands, agrees and consents for the procedure. All questions were addressed. A time out was performed.Patient was receiving adequate preprocedure antibiotic coverage. Maximal barrier sterile technique utilized including caps, mask, sterile gowns, sterile gloves, large sterile drape, hand hygiene, and betadine.  Intravenous Fentanyl and Versed were administered as conscious sedation during continuous  cardiorespiratory monitoring by the radiology RN, with a total moderate sedation time of 60 minutes.  An ice pack was placed in the epigastric region in the distribution of the falciform artery. Under sterile conditions and local anesthesia, 21 gauge ultrasound micropuncture kit access performed of the right common femoral artery under real time ultrasound guidance. Ultrasound documentation was stored. Over a a Benson guide wire, 5-French sheath was inserted. A 5- French C2 catheter used to selectively catheterize the common hepatic artery for selective arteriography. This demonstrated stable hepatic arterial anatomy with no visceral branches identified. Stable coil embolization and occlusion of the gastroduodenal and right hepatic arteries. A high-flow micro catheter was coaxially advanced with the aid of a microguidewire to the proximal right hepatic artery for selective arteriography. No visceral branches were evident. There is no significant reflux. There was good antegrade flow. From this location, the Y 90 dose calculated by Authorized User Dr. Clovis Riley was instilled into the right hepatic artery with good antegrade flow. Follow-up selective arteriogram through the microcatheter demonstrates continued good antegrade flow through the right hepatic artery. Following embolization, the micro catheter and a C2 catheter were removed safely.   Hemostasis was obtained at the common femoral artery access site with Exoseal device after confirmatory femoral arteriogram. No immediate complication. Distal pedal pulses remained normal. The patient tolerated the treatment well.  IMPRESSION: Successful right hepatic artery Y 90 radioembolization for Multifocal hepatocellular carcinoma.  A separate nuclear medicine report will be generated to document dosing and post treatment Bremsstrahlung scan.   Electronically Signed   By: Arne Cleveland M.D.   On: 05/29/2013 14:40   Ir Angiogram Selective Each Additional Vessel  05/06/2013   CLINICAL DATA:  Cirrhosis, post TIPS. Multifocal hepatoma. Preop planning for Y 90 radio embolization.  EXAM: ADDITIONAL ARTERIOGRAPHY; SELECTIVE VISCERAL ARTERIOGRAPHY; IR ULTRASOUND GUIDANCE VASC ACCESS RIGHT; IR EMBO TUMOR ORGAN ISCHEMIA INFARCT INC GUIDE ROADMAPPING  MEDICATIONS: Intravenous Fentanyl and Versed were administered as conscious sedation during continuous cardiorespiratory monitoring by the radiology RN, with a total moderate sedation time of 100 minutes.  CONTRAST:  18m OMNIPAQUE IOHEXOL 300 MG/ML  SOLN  FLUOROSCOPY TIME:  20 min 24 seconds  PROCEDURE: The procedure, risks (including but not limited to bleeding, infection, organ damage ), benefits, and alternatives were explained to the patient. Questions regarding the procedure were encouraged and answered. The patient understands and consents to the procedure.  Right femoral region prepped and draped in usual sterile fashion. Maximal barrier sterile technique was utilized including caps, mask, sterile gowns, sterile gloves, sterile drape, hand hygiene and skin antiseptic. After the skin was infiltrated with 1% lidocaine, the right common femoral artery was accessed with a 21 gauge micropuncture needle under real-time ultrasound guidance. Needle exchanged over 018 guidewire for transitional dilator. This allowed passage of a Bentson wire into the abdominal aorta.  Over this, a 6 [place] sheath was placed, through which a 5 [place] catheter was advanced and used to selectively catheterize the superior mesenteric artery for selective arteriography. The C2 was then utilized to selectively catheterize the celiac axis for selective arteriography. The C2 catheter was then advanced into the common hepatic artery for selective arteriography. The C2 catheter was then advanced into the proximal gastroduodenal artery. A renegade micro catheter was coaxially advanced and coil embolization of the gastroduodenal artery performed with 5 and 6 mm interlock coils. The renegade micro catheter was then used to selectively catheterize the left hepatic artery for selective arteriography. The renegade micro catheter was  withdrawn, and the C2 advanced to selectively catheterize the right hepatic artery for selective arteriography. The C2 was then pulled back into the common hepatic artery for follow-up arteriography of the embolization. After this, this renegade micro catheter was used to selectively catheterize the gastroduodenal artery for additional embolization using a 4 and 5 coils. The renegade micro catheter was then advanced into the Right gastric artery for coil embolization using 3 mm coils. After followup arteriography through the C2 catheter, additional coil embolization of the gastroduodenal artery and right gastric artery performed. After final follow-up arteriography, the renegade micro catheter was advanced into the proper hepatic artery just proximal to its bifurcation for delivery of theTc89mAA test dose to the hepatic arterial bed. The micro catheter and C2 catheter were withdrawn. After confirmatory femoral arteriography, sheath was removed and hemostasis achieved with the aid of the Exoseal device. The patient tolerated the procedure well.  Complications: None  FINDINGS: Superior mesenteric arteriography shows the vessel to be widely patent, with no significant  supply to the hepatic arterial tree.  Celiac arteriography demonstrates widely patent common hepatic and splenic arteries. There is a large gastroduodenal artery supplied by the common hepatic artery. The right gastric artery originates from the proximal left hepatic artery. There is good antegrade flow into the left and right hepatic artery is and intrahepatic branches. Minimal tumor staining is evident. Patency of the TIPS stent not assessed on these arterial injections. The gastroduodenal artery was embolized using multiple coils as above. The right gastric artery was embolized using multiple coils as above. The results of the MAA test bolus to the hepatic arterial tree will be reported separately.  IMPRESSION: 1. Mesenteric anatomy amenable to Y-90 radio embolization, post coil embolization of right gastric and gastroduodenal arteries. 2. Results of Tc981mA test injection to the hepatic arterial tree to assess shunt function will be reported separately.   Electronically Signed   By: DaArne Cleveland.D.   On: 05/06/2013 16:44   Ir Angiogram Selective Each Additional Vessel  05/06/2013   CLINICAL DATA:  Cirrhosis, post TIPS. Multifocal hepatoma. Preop planning for Y 90 radio embolization.  EXAM: ADDITIONAL ARTERIOGRAPHY; SELECTIVE VISCERAL ARTERIOGRAPHY; IR ULTRASOUND GUIDANCE VASC ACCESS RIGHT; IR EMBO TUMOR ORGAN ISCHEMIA INFARCT INC GUIDE ROADMAPPING  MEDICATIONS: Intravenous Fentanyl and Versed were administered as conscious sedation during continuous cardiorespiratory monitoring by the radiology RN, with a total moderate sedation time of 100 minutes.  CONTRAST:  10714mMNIPAQUE IOHEXOL 300 MG/ML  SOLN  FLUOROSCOPY TIME:  20 min 24 seconds  PROCEDURE: The procedure, risks (including but not limited to bleeding, infection, organ damage ), benefits, and alternatives were explained to the patient. Questions regarding the procedure were encouraged and answered. The patient understands and consents to the  procedure.  Right femoral region prepped and draped in usual sterile fashion. Maximal barrier sterile technique was utilized including caps, mask, sterile gowns, sterile gloves, sterile drape, hand hygiene and skin antiseptic. After the skin was infiltrated with 1% lidocaine, the right common femoral artery was accessed with a 21 gauge micropuncture needle under real-time ultrasound guidance. Needle exchanged over 018 guidewire for transitional dilator. This allowed passage of a Bentson wire into the abdominal aorta. Over this, a 6 [place] sheath was placed, through which a 5 [place] catheter was advanced and used to selectively catheterize the superior mesenteric artery for selective arteriography. The C2 was then utilized to selectively catheterize the celiac axis for selective arteriography. The C2 catheter was then advanced into the common  hepatic artery for selective arteriography. The C2 catheter was then advanced into the proximal gastroduodenal artery. A renegade micro catheter was coaxially advanced and coil embolization of the gastroduodenal artery performed with 5 and 6 mm interlock coils. The renegade micro catheter was then used to selectively catheterize the left hepatic artery for selective arteriography. The renegade micro catheter was withdrawn, and the C2 advanced to selectively catheterize the right hepatic artery for selective arteriography. The C2 was then pulled back into the common hepatic artery for follow-up arteriography of the embolization. After this, this renegade micro catheter was used to selectively catheterize the gastroduodenal artery for additional embolization using a 4 and 5 coils. The renegade micro catheter was then advanced into the Right gastric artery for coil embolization using 3 mm coils. After followup arteriography through the C2 catheter, additional coil embolization of the gastroduodenal artery and right gastric artery performed. After final follow-up  arteriography, the renegade micro catheter was advanced into the proper hepatic artery just proximal to its bifurcation for delivery of theTc51mAA test dose to the hepatic arterial bed. The micro catheter and C2 catheter were withdrawn. After confirmatory femoral arteriography, sheath was removed and hemostasis achieved with the aid of the Exoseal device. The patient tolerated the procedure well.  Complications: None  FINDINGS: Superior mesenteric arteriography shows the vessel to be widely patent, with no significant supply to the hepatic arterial tree.  Celiac arteriography demonstrates widely patent common hepatic and splenic arteries. There is a large gastroduodenal artery supplied by the common hepatic artery. The right gastric artery originates from the proximal left hepatic artery. There is good antegrade flow into the left and right hepatic artery is and intrahepatic branches. Minimal tumor staining is evident. Patency of the TIPS stent not assessed on these arterial injections. The gastroduodenal artery was embolized using multiple coils as above. The right gastric artery was embolized using multiple coils as above. The results of the MAA test bolus to the hepatic arterial tree will be reported separately.  IMPRESSION: 1. Mesenteric anatomy amenable to Y-90 radio embolization, post coil embolization of right gastric and gastroduodenal arteries. 2. Results of Tc935mA test injection to the hepatic arterial tree to assess shunt function will be reported separately.   Electronically Signed   By: DaArne Cleveland.D.   On: 05/06/2013 16:44   Ir Angiogram Selective Each Additional Vessel  05/06/2013   CLINICAL DATA:  Cirrhosis, post TIPS. Multifocal hepatoma. Preop planning for Y 90 radio embolization.  EXAM: ADDITIONAL ARTERIOGRAPHY; SELECTIVE VISCERAL ARTERIOGRAPHY; IR ULTRASOUND GUIDANCE VASC ACCESS RIGHT; IR EMBO TUMOR ORGAN ISCHEMIA INFARCT INC GUIDE ROADMAPPING  MEDICATIONS: Intravenous Fentanyl and  Versed were administered as conscious sedation during continuous cardiorespiratory monitoring by the radiology RN, with a total moderate sedation time of 100 minutes.  CONTRAST:  10747mMNIPAQUE IOHEXOL 300 MG/ML  SOLN  FLUOROSCOPY TIME:  20 min 24 seconds  PROCEDURE: The procedure, risks (including but not limited to bleeding, infection, organ damage ), benefits, and alternatives were explained to the patient. Questions regarding the procedure were encouraged and answered. The patient understands and consents to the procedure.  Right femoral region prepped and draped in usual sterile fashion. Maximal barrier sterile technique was utilized including caps, mask, sterile gowns, sterile gloves, sterile drape, hand hygiene and skin antiseptic. After the skin was infiltrated with 1% lidocaine, the right common femoral artery was accessed with a 21 gauge micropuncture needle under real-time ultrasound guidance. Needle exchanged over 018 guidewire for transitional dilator. This allowed passage of  a Bentson wire into the abdominal aorta. Over this, a 6 [place] vascular sheath was placed, through which a 5 [place] C2 catheter was advanced and used to selectively catheterize the superior mesenteric artery for selective arteriography. The C2 was then utilized to selectively catheterize the celiac axis for selective arteriography. The C2 catheter was then advanced into the common hepatic artery for selective arteriography. The C2 catheter was then advanced into the proximal gastroduodenal artery. A renegade micro catheter was coaxially advanced and coil embolization of the gastroduodenal artery performed with 5 and 6 mm interlock coils. The renegade micro catheter was then used to selectively catheterize the left hepatic artery for selective arteriography. The renegade micro catheter was withdrawn, and the C2 advanced to selectively catheterize the right hepatic artery for selective arteriography. The C2 was then pulled back into  the common hepatic artery for follow-up arteriography of the embolization. After this, this renegade micro catheter was used to selectively catheterize the gastroduodenal artery for additional embolization using a 4 and 5 coils. The renegade micro catheter was then advanced into the Right gastric artery for coil embolization using 3 mm coils. After followup arteriography through the C2 catheter, additional coil embolization of the gastroduodenal artery and right gastric artery performed. After final follow-up arteriography, the renegade micro catheter was advanced into the proper hepatic artery just proximal to its bifurcation for delivery of theTc96mAA test dose to the hepatic arterial bed. The micro catheter and C2 catheter were withdrawn. After confirmatory femoral arteriography, sheath was removed and hemostasis achieved with the aid of the Exoseal device. The patient tolerated the procedure well.  Complications: None  FINDINGS: Superior mesenteric arteriography shows the vessel to be widely patent, with no significant supply to the hepatic arterial tree.  Celiac arteriography demonstrates widely patent common hepatic and splenic arteries. There is a large gastroduodenal artery supplied by the common hepatic artery. The right gastric artery originates from the proximal left hepatic artery. There is good antegrade flow into the left and right hepatic artery is and intrahepatic branches. Minimal tumor staining is evident. Patency of the TIPS stent not assessed on these arterial injections. The gastroduodenal artery was embolized using multiple coils as above. The right gastric artery was embolized using multiple coils as above. The results of the MAA test bolus to the hepatic arterial tree will be reported separately.  IMPRESSION: 1. Mesenteric anatomy amenable to Y-90 radio embolization, post coil embolization of right gastric and gastroduodenal arteries. 2. Results of Tc963mA test injection to the hepatic  arterial tree to assess shunt function will be reported separately.   Electronically Signed   By: DaArne Cleveland.D.   On: 05/06/2013 16:44   Ir Angiogram Selective Each Additional Vessel  05/06/2013   CLINICAL DATA:  Cirrhosis, post TIPS. Multifocal hepatoma. Preop planning for Y 90 radio embolization.  EXAM: ADDITIONAL ARTERIOGRAPHY; SELECTIVE VISCERAL ARTERIOGRAPHY; IR ULTRASOUND GUIDANCE VASC ACCESS RIGHT; IR EMBO TUMOR ORGAN ISCHEMIA INFARCT INC GUIDE ROADMAPPING  MEDICATIONS: Intravenous Fentanyl and Versed were administered as conscious sedation during continuous cardiorespiratory monitoring by the radiology RN, with a total moderate sedation time of 100 minutes.  CONTRAST:  10746mMNIPAQUE IOHEXOL 300 MG/ML  SOLN  FLUOROSCOPY TIME:  20 min 24 seconds  PROCEDURE: The procedure, risks (including but not limited to bleeding, infection, organ damage ), benefits, and alternatives were explained to the patient. Questions regarding the procedure were encouraged and answered. The patient understands and consents to the procedure.  Right femoral region prepped and draped in  usual sterile fashion. Maximal barrier sterile technique was utilized including caps, mask, sterile gowns, sterile gloves, sterile drape, hand hygiene and skin antiseptic. After the skin was infiltrated with 1% lidocaine, the right common femoral artery was accessed with a 21 gauge micropuncture needle under real-time ultrasound guidance. Needle exchanged over 018 guidewire for transitional dilator. This allowed passage of a Bentson wire into the abdominal aorta. Over this, a 6 [place] vascular sheath was placed, through which a 5 [place] C2 catheter was advanced and used to selectively catheterize the superior mesenteric artery for selective arteriography. The C2 was then utilized to selectively catheterize the celiac axis for selective arteriography. The C2 catheter was then advanced into the common hepatic artery for selective arteriography.  The C2 catheter was then advanced into the proximal gastroduodenal artery. A renegade micro catheter was coaxially advanced and coil embolization of the gastroduodenal artery performed with 5 and 6 mm interlock coils. The renegade micro catheter was then used to selectively catheterize the left hepatic artery for selective arteriography. The renegade micro catheter was withdrawn, and the C2 advanced to selectively catheterize the right hepatic artery for selective arteriography. The C2 was then pulled back into the common hepatic artery for follow-up arteriography of the embolization. After this, this renegade micro catheter was used to selectively catheterize the gastroduodenal artery for additional embolization using a 4 and 5 coils. The renegade micro catheter was then advanced into the Right gastric artery for coil embolization using 3 mm coils. After followup arteriography through the C2 catheter, additional coil embolization of the gastroduodenal artery and right gastric artery performed. After final follow-up arteriography, the renegade micro catheter was advanced into the proper hepatic artery just proximal to its bifurcation for delivery of theTc11mAA test dose to the hepatic arterial bed. The micro catheter and C2 catheter were withdrawn. After confirmatory femoral arteriography, sheath was removed and hemostasis achieved with the aid of the Exoseal device. The patient tolerated the procedure well.  Complications: None  FINDINGS: Superior mesenteric arteriography shows the vessel to be widely patent, with no significant supply to the hepatic arterial tree.  Celiac arteriography demonstrates widely patent common hepatic and splenic arteries. There is a large gastroduodenal artery supplied by the common hepatic artery. The right gastric artery originates from the proximal left hepatic artery. There is good antegrade flow into the left and right hepatic artery is and intrahepatic branches. Minimal tumor  staining is evident. Patency of the TIPS stent not assessed on these arterial injections. The gastroduodenal artery was embolized using multiple coils as above. The right gastric artery was embolized using multiple coils as above. The results of the MAA test bolus to the hepatic arterial tree will be reported separately.  IMPRESSION: 1. Mesenteric anatomy amenable to Y-90 radio embolization, post coil embolization of right gastric and gastroduodenal arteries. 2. Results of Tc928mA test injection to the hepatic arterial tree to assess shunt function will be reported separately.   Electronically Signed   By: DaArne Cleveland.D.   On: 05/06/2013 16:44   Nm Special Med Rad Physics Cons  05/29/2013   CLINICAL DATA:  Unresectable multifocal hepatoma.  Cirrhosis.  EXAM: NUCLEAR MEDICINE RADIO PHARM THERAPY INTRA ARTERIAL; NUCLEAR MEDICINE TREATMENT PROCEDURE; NUCLEAR MEDICINE SPECIAL MED RAD PHYSICS CONS  TECHNIQUE: In conjunction with the interventional radiologist a Y- Microsphere dose was calculated utilizing body surface area formulation. Pre therapy MAA liver SPECT scan and CTA were evaluated. Utilizing a microcatheter system, the hepatic artery was selected and Y-90 microspheres were  delivered in fractionated aliquots. Radiopharmaceutical was delivered by the interventional radiologist and nuclear radiologist.  The patient tolerated procedure well. No adverse effects were noted. Bremsstrahlung scan to follow.  RADIOPHARMACEUTICALS:  43.8 MCI Y90 MICROSPHERES  COMPARISON:  04/03/2013.  FINDINGS: Y - 90 microspheres therapy as above. First therapy to the right hepatic lobe.  IMPRESSION: Successful Y - 90 microsphere delivery for treatment of unresectable multifocal hepatoma. First therapy to the right hepatic lobe.   Electronically Signed   By: Kerby Moors M.D.   On: 05/29/2013 17:15   Nm Special Treatment Procedure  05/29/2013   CLINICAL DATA:  Unresectable multifocal hepatoma.  Cirrhosis.  EXAM: NUCLEAR  MEDICINE RADIO PHARM THERAPY INTRA ARTERIAL; NUCLEAR MEDICINE TREATMENT PROCEDURE; NUCLEAR MEDICINE SPECIAL MED RAD PHYSICS CONS  TECHNIQUE: In conjunction with the interventional radiologist a Y- Microsphere dose was calculated utilizing body surface area formulation. Pre therapy MAA liver SPECT scan and CTA were evaluated. Utilizing a microcatheter system, the hepatic artery was selected and Y-90 microspheres were delivered in fractionated aliquots. Radiopharmaceutical was delivered by the interventional radiologist and nuclear radiologist.  The patient tolerated procedure well. No adverse effects were noted. Bremsstrahlung scan to follow.  RADIOPHARMACEUTICALS:  43.8 MCI Y90 MICROSPHERES  COMPARISON:  04/03/2013.  FINDINGS: Y - 90 microspheres therapy as above. First therapy to the right hepatic lobe.  IMPRESSION: Successful Y - 90 microsphere delivery for treatment of unresectable multifocal hepatoma. First therapy to the right hepatic lobe.   Electronically Signed   By: Kerby Moors M.D.   On: 05/29/2013 17:15   Ir US Guide Vasc Access Right  05/29/2013   CLINICAL DATA:  Cirrhosis, post TIPS. Multifocal hepatocellular carcinoma.  FLUOROSCOPY TIME:  10 min 42 seconds  TECHNIQUE: ULTRASOUND GUIDANCE FOR VASCULAR ACCESS  SELECTIVE LEFT HEPATIC ARTERY PERIPHERAL CATHETERIZATION,  PRETREATMENT ANGIOGRAM, Y 90 RADIOEMBOLIZATION THERAPY AND POST  EMBOLIZATION ANGIOGRAMS  Technique and findings: Informed consent was obtained from the patient following explanation of the procedure, risks, benefits and alternatives.  The patient understands, agrees and consents for the procedure. All questions were addressed. A time out was performed.Patient was receiving adequate preprocedure antibiotic coverage. Maximal barrier sterile technique utilized including caps, mask, sterile gowns, sterile gloves, large sterile drape, hand hygiene, and betadine.  Intravenous Fentanyl and Versed were administered as conscious sedation  during continuous cardiorespiratory monitoring by the radiology RN, with a total moderate sedation time of 60 minutes.  An ice pack was placed in the epigastric region in the distribution of the falciform artery. Under sterile conditions and local anesthesia, 21 gauge ultrasound micropuncture kit access performed of the right common femoral artery under real time ultrasound guidance. Ultrasound documentation was stored. Over a a [place] guide wire, 5-French sheath was inserted. A 5- French C2 catheter used to selectively catheterize the common hepatic artery for selective arteriography. This demonstrated stable hepatic arterial anatomy with no visceral branches identified. Stable coil embolization and occlusion of the gastroduodenal and right hepatic arteries. A high-flow micro catheter was coaxially advanced with the aid of a microguidewire to the proximal right hepatic artery for selective arteriography. No visceral branches were evident. There is no significant reflux. There was good antegrade flow. From this location, the Y 90 dose calculated by Authorized User Dr. Clovis Riley was instilled into the right hepatic artery with good antegrade flow. Follow-up selective arteriogram through the microcatheter demonstrates continued good antegrade flow through the right hepatic artery. Following embolization, the micro catheter and a C2 catheter were removed safely.  Hemostasis was  obtained at the common femoral artery access site with Exoseal device after confirmatory femoral arteriogram. No immediate complication. Distal pedal pulses remained normal. The patient tolerated the treatment well.  IMPRESSION: Successful right hepatic artery Y 90 radioembolization for Multifocal hepatocellular carcinoma.  A separate nuclear medicine report will be generated to document dosing and post treatment Bremsstrahlung scan.   Electronically Signed   By: Arne Cleveland M.D.   On: 05/29/2013 14:40   Ir US Guide Vasc Access  Right  05/06/2013   CLINICAL DATA:  Cirrhosis, post TIPS. Multifocal hepatoma. Preop planning for Y 90 radio embolization.  EXAM: ADDITIONAL ARTERIOGRAPHY; SELECTIVE VISCERAL ARTERIOGRAPHY; IR ULTRASOUND GUIDANCE VASC ACCESS RIGHT; IR EMBO TUMOR ORGAN ISCHEMIA INFARCT INC GUIDE ROADMAPPING  MEDICATIONS: Intravenous Fentanyl and Versed were administered as conscious sedation during continuous cardiorespiratory monitoring by the radiology RN, with a total moderate sedation time of 100 minutes.  CONTRAST:  176m OMNIPAQUE IOHEXOL 300 MG/ML  SOLN  FLUOROSCOPY TIME:  20 min 24 seconds  PROCEDURE: The procedure, risks (including but not limited to bleeding, infection, organ damage ), benefits, and alternatives were explained to the patient. Questions regarding the procedure were encouraged and answered. The patient understands and consents to the procedure.  Right femoral region prepped and draped in usual sterile fashion. Maximal barrier sterile technique was utilized including caps, mask, sterile gowns, sterile gloves, sterile drape, hand hygiene and skin antiseptic. After the skin was infiltrated with 1% lidocaine, the right common femoral artery was accessed with a 21 gauge micropuncture needle under real-time ultrasound guidance. Needle exchanged over 018 guidewire for transitional dilator. This allowed passage of a Bentson wire into the abdominal aorta. Over this, a 6 [place] sheath was placed, through which a 5 [place]2 catheter was advanced and used to selectively catheterize the superior mesenteric artery for selective arteriography. The C2 was then utilized to selectively catheterize the celiac axis for selective arteriography. The C2 catheter was then advanced into the common hepatic artery for selective arteriography. The C2 catheter was then advanced into the proximal gastroduodenal artery. A renegade micro catheter was coaxially advanced and coil embolization of the gastroduodenal artery performed  with 5 and 6 mm interlock coils. The renegade micro catheter was then used to selectively catheterize the left hepatic artery for selective arteriography. The renegade micro catheter was withdrawn, and the C2 advanced to selectively catheterize the right hepatic artery for selective arteriography. The C2 was then pulled back into the common hepatic artery for follow-up arteriography of the embolization. After this, this renegade micro catheter was used to selectively catheterize the gastroduodenal artery for additional embolization using a 4 and 5 coils. The renegade micro catheter was then advanced into the Right gastric artery for coil embolization using 3 mm coils. After followup arteriography through the C2 catheter, additional coil embolization of the gastroduodenal artery and right gastric artery performed. After final follow-up arteriography, the renegade micro catheter was advanced into the proper hepatic artery just proximal to its bifurcation for delivery of theTc928mA test dose to the hepatic arterial bed. The micro catheter and C2 catheter were withdrawn. After confirmatory femoral arteriography, sheath was removed and hemostasis achieved with the aid of the Exoseal device. The patient tolerated the procedure well.  Complications: None  FINDINGS: Superior mesenteric arteriography shows the vessel to be widely patent, with no significant supply to the hepatic arterial tree.  Celiac arteriography demonstrates widely patent common hepatic and splenic arteries. There is a large gastroduodenal artery supplied by the common hepatic  artery. The right gastric artery originates from the proximal left hepatic artery. There is good antegrade flow into the left and right hepatic artery is and intrahepatic branches. Minimal tumor staining is evident. Patency of the TIPS stent not assessed on these arterial injections. The gastroduodenal artery was embolized using multiple coils as above. The right gastric artery was  embolized using multiple coils as above. The results of the MAA test bolus to the hepatic arterial tree will be reported separately.  IMPRESSION: 1. Mesenteric anatomy amenable to Y-90 radio embolization, post coil embolization of right gastric and gastroduodenal arteries. 2. Results of Tc45mAA test injection to the hepatic arterial tree to assess shunt function will be reported separately.   Electronically Signed   By: DArne ClevelandM.D.   On: 05/06/2013 16:44   Ir Embo Arterial [place] 05/29/2013   CLINICAL DATA:  Cirrhosis, post TIPS. Multifocal hepatocellular carcinoma.  FLUOROSCOPY TIME:  10 min 42 seconds  TECHNIQUE: ULTRASOUND GUIDANCE FOR VASCULAR ACCESS  SELECTIVE LEFT HEPATIC ARTERY PERIPHERAL CATHETERIZATION,  PRETREATMENT ANGIOGRAM, Y 90 RADIOEMBOLIZATION THERAPY AND POST  EMBOLIZATION ANGIOGRAMS  Technique and findings: Informed consent was obtained from the patient following explanation of the procedure, risks, benefits and alternatives.  The patient understands, agrees and consents for the procedure. All questions were addressed. A time out was performed.Patient was receiving adequate preprocedure antibiotic coverage. Maximal barrier sterile technique utilized including caps, mask, sterile gowns, sterile gloves, large sterile drape, hand hygiene, and betadine.  Intravenous Fentanyl and Versed were administered as conscious sedation during continuous cardiorespiratory monitoring by the radiology RN, with a total moderate sedation time of 60 minutes.  An ice pack was placed in the epigastric region in the distribution of the falciform artery. Under sterile conditions and local anesthesia, 21 gauge ultrasound micropuncture kit access performed of the right common femoral artery under real time ultrasound guidance. Ultrasound documentation was stored. Over a a Benson guide wire, 5-French sheath was inserted. A 5- French C2 catheter used to selectively catheterize the  common hepatic artery for selective arteriography. This demonstrated stable hepatic arterial anatomy with no visceral branches identified. Stable coil embolization and occlusion of the gastroduodenal and right hepatic arteries. A high-flow micro catheter was coaxially advanced with the aid of a microguidewire to the proximal right hepatic artery for selective arteriography. No visceral branches were evident. There is no significant reflux. There was good antegrade flow. From this location, the Y 90 dose calculated by Authorized User Dr. SClovis Rileywas instilled into the right hepatic artery with good antegrade flow. Follow-up selective arteriogram through the microcatheter demonstrates continued good antegrade flow through the right hepatic artery. Following embolization, the micro catheter and a C2 catheter were removed safely.  Hemostasis was obtained at the common femoral artery access site with Exoseal device after confirmatory femoral arteriogram. No immediate complication. Distal pedal pulses remained normal. The patient tolerated the treatment well.  IMPRESSION: Successful right hepatic artery Y 90 radioembolization for Multifocal hepatocellular carcinoma.  A separate nuclear medicine report will be generated to document dosing and post treatment Bremsstrahlung scan.   Electronically Signed   By: DArne ClevelandM.D.   On: 05/29/2013 14:40   Ir Embo Tumor Organ Ischemia Infarct Inc Guide Roadmapping  05/06/2013   CLINICAL DATA:  Cirrhosis, post TIPS. Multifocal hepatoma. Preop planning for Y 90 radio embolization.  EXAM: ADDITIONAL ARTERIOGRAPHY; SELECTIVE VISCERAL ARTERIOGRAPHY; IR ULTRASOUND GUIDANCE VASC ACCESS RIGHT; IR EMBO TUMOR ORGAN ISCHEMIA INFARCT INC GUIDE ROADMAPPING  MEDICATIONS: Intravenous Fentanyl and Versed were administered as conscious sedation during continuous cardiorespiratory monitoring by the radiology RN, with a total moderate sedation time of 100 minutes.  CONTRAST:  122m OMNIPAQUE  IOHEXOL 300 MG/ML  SOLN  FLUOROSCOPY TIME:  20 min 24 seconds  PROCEDURE: The procedure, risks (including but not limited to bleeding, infection, organ damage ), benefits, and alternatives were explained to the patient. Questions regarding the procedure were encouraged and answered. The patient understands and consents to the procedure.  Right femoral region prepped and draped in usual sterile fashion. Maximal barrier sterile technique was utilized including caps, mask, sterile gowns, sterile gloves, sterile drape, hand hygiene and skin antiseptic. After the skin was infiltrated with 1% lidocaine, the right common femoral artery was accessed with a 21 gauge micropuncture needle under real-time ultrasound guidance. Needle exchanged over 018 guidewire for transitional dilator. This allowed passage of a Bentson wire into the abdominal aorta. Over this, a 6 [place] sheath was placed, through which a 5 [place] catheter was advanced and used to selectively catheterize the superior mesenteric artery for selective arteriography. The C2 was then utilized to selectively catheterize the celiac axis for selective arteriography. The C2 catheter was then advanced into the common hepatic artery for selective arteriography. The C2 catheter was then advanced into the proximal gastroduodenal artery. A renegade micro catheter was coaxially advanced and coil embolization of the gastroduodenal artery performed with 5 and 6 mm interlock coils. The renegade micro catheter was then used to selectively catheterize the left hepatic artery for selective arteriography. The renegade micro catheter was withdrawn, and the C2 advanced to selectively catheterize the right hepatic artery for selective arteriography. The C2 was then pulled back into the common hepatic artery for follow-up arteriography of the embolization. After this, this renegade micro catheter was used to selectively catheterize the gastroduodenal artery for additional  embolization using a 4 and 5 coils. The renegade micro catheter was then advanced into the Right gastric artery for coil embolization using 3 mm coils. After followup arteriography through the C2 catheter, additional coil embolization of the gastroduodenal artery and right gastric artery performed. After final follow-up arteriography, the renegade micro catheter was advanced into the proper hepatic artery just proximal to its bifurcation for delivery of theTc95mA test dose to the hepatic arterial bed. The micro catheter and C2 catheter were withdrawn. After confirmatory femoral arteriography, sheath was removed and hemostasis achieved with the aid of the Exoseal device. The patient tolerated the procedure well.  Complications: None  FINDINGS: Superior mesenteric arteriography shows the vessel to be widely patent, with no significant supply to the hepatic arterial tree.  Celiac arteriography demonstrates widely patent common hepatic and splenic arteries. There is a large gastroduodenal artery supplied by the common hepatic artery. The right gastric artery originates from the proximal left hepatic artery. There is good antegrade flow into the left and right hepatic artery is and intrahepatic branches. Minimal tumor staining is evident. Patency of the TIPS stent not assessed on these arterial injections. The gastroduodenal artery was embolized using multiple coils as above. The right gastric artery was embolized using multiple coils as above. The results of the MAA test bolus to the hepatic arterial tree will be reported separately.  IMPRESSION: 1. Mesenteric anatomy amenable to Y-90 radio embolization, post coil embolization of right gastric and gastroduodenal arteries. 2. Results of Tc9958m test injection to the hepatic arterial tree to assess shunt function will be reported separately.   Electronically Signed  By: Arne Cleveland M.D.   On: 05/06/2013 16:44   Nm Radio Pharm Therapy Intraarterial  05/29/2013    CLINICAL DATA:  Unresectable multifocal hepatoma.  Cirrhosis.  EXAM: NUCLEAR MEDICINE RADIO PHARM THERAPY INTRA ARTERIAL; NUCLEAR MEDICINE TREATMENT PROCEDURE; NUCLEAR MEDICINE SPECIAL MED RAD PHYSICS CONS  TECHNIQUE: In conjunction with the interventional radiologist a Y- Microsphere dose was calculated utilizing body surface area formulation. Pre therapy MAA liver SPECT scan and CTA were evaluated. Utilizing a microcatheter system, the hepatic artery was selected and Y-90 microspheres were delivered in fractionated aliquots. Radiopharmaceutical was delivered by the interventional radiologist and nuclear radiologist.  The patient tolerated procedure well. No adverse effects were noted. Bremsstrahlung scan to follow.  RADIOPHARMACEUTICALS:  43.8 MCI Y90 MICROSPHERES  COMPARISON:  04/03/2013.  FINDINGS: Y - 90 microspheres therapy as above. First therapy to the right hepatic lobe.  IMPRESSION: Successful Y - 90 microsphere delivery for treatment of unresectable multifocal hepatoma. First therapy to the right hepatic lobe.   Electronically Signed   By: Kerby Moors M.D.   On: 05/29/2013 17:15   Nm Fusion  05/06/2013   EXAM: NUCLEAR MEDICINE LIVER SCAN with SPECT  TECHNIQUE: Abdominal images were obtained in multiple projections after intravenous injection of radiopharmaceutical. Post processing fusion with CT performed.  RADIOPHARMACEUTICALS:  3.3MILLI CURIE MAA TECHNETIUM TO 17M ALBUMIN AGGREGATED  COMPARISON:  IR ANGIO/VISCERAL dated 05/06/2013; CT ABDOMEN W/O CM dated 03/21/2013  FINDINGS: Intra-arterial injected MAA localizes to the liver. There is a curvilinear focus of activity extending anterior to the right hepatic lobe which may represent a vascular structure. No evidence of bowel or stomach uptake.  Lung shunt fraction equals 3.2%.  IMPRESSION: 1. Curvilinear activity anterior to the right hepatic lobe may represent a vascular structure such as a falciform ligament artery. 2. Lung shunt fraction equals  3.2%. Findings conveyed toDAYNE HASSELL III on 05/06/2013  at17:52.   Electronically Signed   By: Suzy Bouchard M.D.   On: 05/06/2013 17:54    Microbiology: Recent Results (from the past 240 hour(s))  CULTURE, BLOOD (ROUTINE X 2)     Status: None   Collection Time    05/26/13 12:05 PM      Result Value Ref Range Status   Specimen Description BLOOD BLOOD RIGHT FOREARM   Final   Special Requests BOTTLES DRAWN AEROBIC AND ANAEROBIC [facility] EACH   Final   Culture  Setup Time     Final   Value: 05/26/2013 18:35     Performed at [facility]   Culture     Final   Value:        BLOOD CULTURE RECEIVED NO GROWTH TO DATE CULTURE WILL BE HELD FOR 5 DAYS BEFORE ISSUING A FINAL NEGATIVE REPORT     Performed at [facility]   Report Status PENDING   Incomplete  CULTURE, BLOOD (ROUTINE X 2)     Status: None   Collection Time    05/26/13 12:15 PM      Result Value Ref Range Status   Specimen Description BLOOD RIGHT HAND   Final   Special Requests BOTTLES DRAWN AEROBIC ONLY 4CC   Final   Culture  Setup Time     Final   Value: 05/26/2013 18:35     Performed at [facility]   Culture     Final   Value:        BLOOD CULTURE RECEIVED NO GROWTH TO DATE CULTURE WILL BE HELD FOR 5 DAYS BEFORE  ISSUING A FINAL NEGATIVE REPORT     Performed at [facility]   Report Status PENDING   Incomplete     Labs: Basic Metabolic Panel:  Recent Labs Lab 05/26/13 1215 05/28/13 0330 05/29/13 0334 05/30/13 0330  NA 140 136* 136* 137  K 4.1 4.0 3.8 4.8  CL 100 97 96 97  CO2 _0 GLUCOSE 185* 281* 255* 354*  BUN _1 CREATININE 1.18 1.15 1.15 1.24  CALCIUM 9.3 8.5 8.9 9.2   Liver Function Tests:  Recent Labs Lab 05/26/13 1215 05/29/13 0334  AST 20 18  ALT 10 8  ALKPHOS 57 52  BILITOT 1.4* 1.0  PROT 6.4 5.5*  ALBUMIN 3.3* 2.8*   No results found for this basename: LIPASE, AMYLASE,  in the last 168 hours  Recent Labs Lab 05/26/13 1215  AMMONIA 29    CBC:  Recent Labs Lab 05/26/13 1215 05/28/13 0330 05/29/13 0334 05/30/13 0330  WBC 5.0 3.9* 3.8* 5.1  NEUTROABS 3.8  --   --   --   HGB 10.7* 10.3* 10.4* 10.2*  HCT 32.6* 30.6* 30.6* 31.2*  MCV 98.8 97.1 95.3 95.4  PLT 37* 50* 52* 56*   Cardiac Enzymes: No results found for this basename: CKTOTAL, CKMB, CKMBINDEX, TROPONINI,  in the last 168 hours BNP: BNP (last 3 results) No results found for this basename: PROBNP,  in the last 8760 hours CBG:  Recent Labs Lab 05/29/13 2125 05/29/13 2233 05/30/13 0202 05/30/13 0734 05/30/13 1154  GLUCAP 400* 395* 355* 284* 311*       Signed:  Erline Hau  [facility] Pager: [phone] 05/30/2013, 3:56 PM

## 2013-05-30 NOTE — Discharge Instructions (Signed)
Post Y-90 Radioembolization Discharge Instructions  You have been given a radioactive material during your procedure.  While it is safe for you to be discharged home from the hospital, you need to proceed directly home.    Do not use public transportation, including air travel, lasting more than 2 hours for 1 week.  Avoid crowded public places for 1 week.  Adult visitors should try to avoid close contact with you for 1 week.    Children and pregnant females should not visit or have close contact with you for 1 week.  Items that you touch are not radioactive.  Do not sleep in the same bed as your partner for 1 week, and a condom should be used for sexual activity during the first 24 hours.  Your blood may be radioactive and caution should be used if any bleeding occurs during the recovery period.  Body fluids may be radioactive for 24 hours.  Wash your hands after voiding.  Men should sit to urinate.  Dispose of any soiled materials (flush down toilet or place in trash at home) during the first day.  Drink 6 to 8 glasses of fluids per day for 5 days to hydrate yourself.  If you need to see a doctor during the first week, you must let them know that you were treated with yttrium-90 microspheres, and will be slightly radioactive.  They can call Interventional Radiology [phone] with any questions.Post Y-90 Radioembolization Discharge Instructions  You have been given a radioactive material during your procedure.  While it is safe for you to be discharged home from the hospital, you need to proceed directly home.    Do not use public transportation, including air travel, lasting more than 2 hours for 1 week.  Avoid crowded public places for 1 week.  Adult visitors should try to avoid close contact with you for 1 week.    Children and pregnant females should not visit or have close contact with you for 1 week.  Items that you touch are not radioactive.  Do not sleep in the same bed as  your partner for 1 week, and a condom should be used for sexual activity during the first 24 hours.  Your blood may be radioactive and caution should be used if any bleeding occurs during the recovery period.  Body fluids may be radioactive for 24 hours.  Wash your hands after voiding.  Men should sit to urinate.  Dispose of any soiled materials (flush down toilet or place in trash at home) during the first day.  Drink 6 to 8 glasses of fluids per day for 5 days to hydrate yourself.  If you need to see a doctor during the first week, you must let them know that you were treated with yttrium-90 microspheres, and will be slightly radioactive.  They can call Interventional Radiology [phone] with any questions.

## 2013-05-30 NOTE — Progress Notes (Signed)
Pt discharged home with spouse in stable condition. Discharge instructions and scripts given. Pt and wife verbalized understanding  

## 2013-05-30 NOTE — Progress Notes (Signed)
Pt HS cbg was 400. Pt on 20mg  of decadron. Notified on call order was to give 7 units . Pt cbg came down to 355. Pt states at home can get as  Low as 39, will continue to monitor closely.

## 2013-05-30 NOTE — Telephone Encounter (Signed)
Pt was discharged from hospital and wife called and would like some insulin instructions Novolog  Can you please advise patient with information   Thank You:)

## 2013-05-30 NOTE — Progress Notes (Signed)
Subjective: Patient is s/p successful Y-90 radioembolization of right hepatic artery on 05/29/13. He states last evening he did have some RUQ pain that was controlled with pain medications, today he denies any pain and has not needed any medication. He denies any right groin site pain or swelling. He denies any fever or chills.   Objective: Physical Exam: BP 131/49  Pulse 52  Temp(Src) 97.9 F (36.6 C) (Oral)  Resp 16  Ht '6\' 3"'  (1.905 m)  Wt 245 lb (111.131 kg)  BMI 30.62 kg/m2  SpO2 97%  General: A&Ox3, NAD, sitting up in chair Abd: Soft, NT, ND, (+) BS Ext: Right femoral groin site dressing C/D/I, no signs of bleeding, hematoma, or infection. RLE erythematous improved and swelling improved. Feet warm bilaterally.   Labs: CBC  Recent Labs  05/29/13 0334 05/30/13 0330  WBC 3.8* 5.1  HGB 10.4* 10.2*  HCT 30.6* 31.2*  PLT 52* 56*   BMET  Recent Labs  05/29/13 0334 05/30/13 0330  NA 136* 137  K 3.8 4.8  CL 96 97  CO2 29 29  GLUCOSE 255* 354*  BUN 13 23  CREATININE 1.15 1.24  CALCIUM 8.9 9.2   LFT  Recent Labs  05/29/13 0334  PROT 5.5*  ALBUMIN 2.8*  AST 18  ALT 8  ALKPHOS 52  BILITOT 1.0   PT/INR  Recent Labs  05/29/13 0334  LABPROT 16.2*  INR 1.33     Studies/Results: Nm Liver Img Spect  05/29/2013   CLINICAL DATA:  Y - 90 microspheres therapy for unresectable multifocal hepatoma  EXAM: NUCLEAR MEDICINE LIVER SCAN SPECT  TECHNIQUE: Abdominal images were obtained in multiple projections after intravenous injection of radiopharmaceutical.  RADIOPHARMACEUTICALS:  43.8 MCI Y90 MICROSPHERES  COMPARISON:  05/06/13.  FINDINGS: Bremsstrahlung planar and SPECT imaging of the abdomen following intrahepatic arterial delivery of Y-90 microsphere demonstrates radioactivity localized to the left hepatic lobe. No evidence of extrahepatic activity.  IMPRESSION: Bremssstrahlung scan demonstrates activity localized to the liver with no extrahepatic activity identified.    Electronically Signed   By: Kerby Moors M.D.   On: 05/29/2013 17:12   Ir Angiogram Visceral Selective  05/29/2013   CLINICAL DATA:  Cirrhosis, post TIPS. Multifocal hepatocellular carcinoma.  FLUOROSCOPY TIME:  10 min 42 seconds  TECHNIQUE: ULTRASOUND GUIDANCE FOR VASCULAR ACCESS  SELECTIVE LEFT HEPATIC ARTERY PERIPHERAL CATHETERIZATION,  PRETREATMENT ANGIOGRAM, Y 90 RADIOEMBOLIZATION THERAPY AND POST  EMBOLIZATION ANGIOGRAMS  Technique and findings: Informed consent was obtained from the patient following explanation of the procedure, risks, benefits and alternatives.  The patient understands, agrees and consents for the procedure. All questions were addressed. A time out was performed.Patient was receiving adequate preprocedure antibiotic coverage. Maximal barrier sterile technique utilized including caps, mask, sterile gowns, sterile gloves, large sterile drape, hand hygiene, and betadine.  Intravenous Fentanyl and Versed were administered as conscious sedation during continuous cardiorespiratory monitoring by the radiology RN, with a total moderate sedation time of 60 minutes.  An ice pack was placed in the epigastric region in the distribution of the falciform artery. Under sterile conditions and local anesthesia, 21 gauge ultrasound micropuncture kit access performed of the right common femoral artery under real time ultrasound guidance. Ultrasound documentation was stored. Over a a Benson guide wire, 5-French sheath was inserted. A 5- French C2 catheter used to selectively catheterize the common hepatic artery for selective arteriography. This demonstrated stable hepatic arterial anatomy with no visceral branches identified. Stable coil embolization and occlusion of the gastroduodenal and right hepatic  arteries. A high-flow micro catheter was coaxially advanced with the aid of a microguidewire to the proximal right hepatic artery for selective arteriography. No visceral branches were evident. There is no  significant reflux. There was good antegrade flow. From this location, the Y 90 dose calculated by Authorized User Dr. Clovis Riley was instilled into the right hepatic artery with good antegrade flow. Follow-up selective arteriogram through the microcatheter demonstrates continued good antegrade flow through the right hepatic artery. Following embolization, the micro catheter and a C2 catheter were removed safely.  Hemostasis was obtained at the common femoral artery access site with Exoseal device after confirmatory femoral arteriogram. No immediate complication. Distal pedal pulses remained normal. The patient tolerated the treatment well.  IMPRESSION: Successful right hepatic artery Y 90 radioembolization for Multifocal hepatocellular carcinoma.  A separate nuclear medicine report will be generated to document dosing and post treatment Bremsstrahlung scan.   Electronically Signed   By: Arne Cleveland M.D.   On: 05/29/2013 14:40   Ir Angiogram Selective Each Additional Vessel  05/29/2013   CLINICAL DATA:  Cirrhosis, post TIPS. Multifocal hepatocellular carcinoma.  FLUOROSCOPY TIME:  10 min 42 seconds  TECHNIQUE: ULTRASOUND GUIDANCE FOR VASCULAR ACCESS  SELECTIVE LEFT HEPATIC ARTERY PERIPHERAL CATHETERIZATION,  PRETREATMENT ANGIOGRAM, Y 90 RADIOEMBOLIZATION THERAPY AND POST  EMBOLIZATION ANGIOGRAMS  Technique and findings: Informed consent was obtained from the patient following explanation of the procedure, risks, benefits and alternatives.  The patient understands, agrees and consents for the procedure. All questions were addressed. A time out was performed.Patient was receiving adequate preprocedure antibiotic coverage. Maximal barrier sterile technique utilized including caps, mask, sterile gowns, sterile gloves, large sterile drape, hand hygiene, and betadine.  Intravenous Fentanyl and Versed were administered as conscious sedation during continuous cardiorespiratory monitoring by the radiology RN, with a total  moderate sedation time of 60 minutes.  An ice pack was placed in the epigastric region in the distribution of the falciform artery. Under sterile conditions and local anesthesia, 21 gauge ultrasound micropuncture kit access performed of the right common femoral artery under real time ultrasound guidance. Ultrasound documentation was stored. Over a a Benson guide wire, 5-French sheath was inserted. A 5- French C2 catheter used to selectively catheterize the common hepatic artery for selective arteriography. This demonstrated stable hepatic arterial anatomy with no visceral branches identified. Stable coil embolization and occlusion of the gastroduodenal and right hepatic arteries. A high-flow micro catheter was coaxially advanced with the aid of a microguidewire to the proximal right hepatic artery for selective arteriography. No visceral branches were evident. There is no significant reflux. There was good antegrade flow. From this location, the Y 90 dose calculated by Authorized User Dr. Clovis Riley was instilled into the right hepatic artery with good antegrade flow. Follow-up selective arteriogram through the microcatheter demonstrates continued good antegrade flow through the right hepatic artery. Following embolization, the micro catheter and a C2 catheter were removed safely.  Hemostasis was obtained at the common femoral artery access site with Exoseal device after confirmatory femoral arteriogram. No immediate complication. Distal pedal pulses remained normal. The patient tolerated the treatment well.  IMPRESSION: Successful right hepatic artery Y 90 radioembolization for Multifocal hepatocellular carcinoma.  A separate nuclear medicine report will be generated to document dosing and post treatment Bremsstrahlung scan.   Electronically Signed   By: Arne Cleveland M.D.   On: 05/29/2013 14:40   Nm Special Med Rad Physics Cons  05/29/2013   CLINICAL DATA:  Unresectable multifocal hepatoma.  Cirrhosis.  EXAM:  NUCLEAR MEDICINE RADIO PHARM THERAPY INTRA ARTERIAL; NUCLEAR MEDICINE TREATMENT PROCEDURE; NUCLEAR MEDICINE SPECIAL MED RAD PHYSICS CONS  TECHNIQUE: In conjunction with the interventional radiologist a Y- Microsphere dose was calculated utilizing body surface area formulation. Pre therapy MAA liver SPECT scan and CTA were evaluated. Utilizing a microcatheter system, the hepatic artery was selected and Y-90 microspheres were delivered in fractionated aliquots. Radiopharmaceutical was delivered by the interventional radiologist and nuclear radiologist.  The patient tolerated procedure well. No adverse effects were noted. Bremsstrahlung scan to follow.  RADIOPHARMACEUTICALS:  43.8 MCI Y90 MICROSPHERES  COMPARISON:  04/03/2013.  FINDINGS: Y - 90 microspheres therapy as above. First therapy to the right hepatic lobe.  IMPRESSION: Successful Y - 90 microsphere delivery for treatment of unresectable multifocal hepatoma. First therapy to the right hepatic lobe.   Electronically Signed   By: Kerby Moors M.D.   On: 05/29/2013 17:15   Nm Special Treatment Procedure  05/29/2013   CLINICAL DATA:  Unresectable multifocal hepatoma.  Cirrhosis.  EXAM: NUCLEAR MEDICINE RADIO PHARM THERAPY INTRA ARTERIAL; NUCLEAR MEDICINE TREATMENT PROCEDURE; NUCLEAR MEDICINE SPECIAL MED RAD PHYSICS CONS  TECHNIQUE: In conjunction with the interventional radiologist a Y- Microsphere dose was calculated utilizing body surface area formulation. Pre therapy MAA liver SPECT scan and CTA were evaluated. Utilizing a microcatheter system, the hepatic artery was selected and Y-90 microspheres were delivered in fractionated aliquots. Radiopharmaceutical was delivered by the interventional radiologist and nuclear radiologist.  The patient tolerated procedure well. No adverse effects were noted. Bremsstrahlung scan to follow.  RADIOPHARMACEUTICALS:  43.8 MCI Y90 MICROSPHERES  COMPARISON:  04/03/2013.  FINDINGS: Y - 90 microspheres therapy as above. First  therapy to the right hepatic lobe.  IMPRESSION: Successful Y - 90 microsphere delivery for treatment of unresectable multifocal hepatoma. First therapy to the right hepatic lobe.   Electronically Signed   By: Kerby Moors M.D.   On: 05/29/2013 17:15   Ir US Guide Vasc Access Right  05/29/2013   CLINICAL DATA:  Cirrhosis, post TIPS. Multifocal hepatocellular carcinoma.  FLUOROSCOPY TIME:  10 min 42 seconds  TECHNIQUE: ULTRASOUND GUIDANCE FOR VASCULAR ACCESS  SELECTIVE LEFT HEPATIC ARTERY PERIPHERAL CATHETERIZATION,  PRETREATMENT ANGIOGRAM, Y 90 RADIOEMBOLIZATION THERAPY AND POST  EMBOLIZATION ANGIOGRAMS  Technique and findings: Informed consent was obtained from the patient following explanation of the procedure, risks, benefits and alternatives.  The patient understands, agrees and consents for the procedure. All questions were addressed. A time out was performed.Patient was receiving adequate preprocedure antibiotic coverage. Maximal barrier sterile technique utilized including caps, mask, sterile gowns, sterile gloves, large sterile drape, hand hygiene, and betadine.  Intravenous Fentanyl and Versed were administered as conscious sedation during continuous cardiorespiratory monitoring by the radiology RN, with a total moderate sedation time of 60 minutes.  An ice pack was placed in the epigastric region in the distribution of the falciform artery. Under sterile conditions and local anesthesia, 21 gauge ultrasound micropuncture kit access performed of the right common femoral artery under real time ultrasound guidance. Ultrasound documentation was stored. Over a a Benson guide wire, 5-French sheath was inserted. A 5- French C2 catheter used to selectively catheterize the common hepatic artery for selective arteriography. This demonstrated stable hepatic arterial anatomy with no visceral branches identified. Stable coil embolization and occlusion of the gastroduodenal and right hepatic arteries. A high-flow  micro catheter was coaxially advanced with the aid of a microguidewire to the proximal right hepatic artery for selective arteriography. No visceral branches were evident. There is no significant reflux.  There was good antegrade flow. From this location, the Y 90 dose calculated by Authorized User Dr. Clovis Riley was instilled into the right hepatic artery with good antegrade flow. Follow-up selective arteriogram through the microcatheter demonstrates continued good antegrade flow through the right hepatic artery. Following embolization, the micro catheter and a C2 catheter were removed safely.  Hemostasis was obtained at the common femoral artery access site with Exoseal device after confirmatory femoral arteriogram. No immediate complication. Distal pedal pulses remained normal. The patient tolerated the treatment well.  IMPRESSION: Successful right hepatic artery Y 90 radioembolization for Multifocal hepatocellular carcinoma.  A separate nuclear medicine report will be generated to document dosing and post treatment Bremsstrahlung scan.   Electronically Signed   By: Arne Cleveland M.D.   On: 05/29/2013 14:40   Ir Embo Arterial Not [place]  05/29/2013   CLINICAL DATA:  Cirrhosis, post TIPS. Multifocal hepatocellular carcinoma.  FLUOROSCOPY TIME:  10 min 42 seconds  TECHNIQUE: ULTRASOUND GUIDANCE FOR VASCULAR ACCESS  SELECTIVE LEFT HEPATIC ARTERY PERIPHERAL CATHETERIZATION,  PRETREATMENT ANGIOGRAM, Y 90 RADIOEMBOLIZATION THERAPY AND POST  EMBOLIZATION ANGIOGRAMS  Technique and findings: Informed consent was obtained from the patient following explanation of the procedure, risks, benefits and alternatives.  The patient understands, agrees and consents for the procedure. All questions were addressed. A time out was performed.Patient was receiving adequate preprocedure antibiotic coverage. Maximal barrier sterile technique utilized including caps, mask, sterile gowns, sterile gloves, large  sterile drape, hand hygiene, and betadine.  Intravenous Fentanyl and Versed were administered as conscious sedation during continuous cardiorespiratory monitoring by the radiology RN, with a total moderate sedation time of 60 minutes.  An ice pack was placed in the epigastric region in the distribution of the falciform artery. Under sterile conditions and local anesthesia, 21 gauge ultrasound micropuncture kit access performed of the right common femoral artery under real time ultrasound guidance. Ultrasound documentation was stored. Over a a Benson guide wire, 5-French sheath was inserted. A 5- French C2 catheter used to selectively catheterize the common hepatic artery for selective arteriography. This demonstrated stable hepatic arterial anatomy with no visceral branches identified. Stable coil embolization and occlusion of the gastroduodenal and right hepatic arteries. A high-flow micro catheter was coaxially advanced with the aid of a microguidewire to the proximal right hepatic artery for selective arteriography. No visceral branches were evident. There is no significant reflux. There was good antegrade flow. From this location, the Y 90 dose calculated by Authorized User Dr. Clovis Riley was instilled into the right hepatic artery with good antegrade flow. Follow-up selective arteriogram through the microcatheter demonstrates continued good antegrade flow through the right hepatic artery. Following embolization, the micro catheter and a C2 catheter were removed safely.  Hemostasis was obtained at the common femoral artery access site with Exoseal device after confirmatory femoral arteriogram. No immediate complication. Distal pedal pulses remained normal. The patient tolerated the treatment well.  IMPRESSION: Successful right hepatic artery Y 90 radioembolization for Multifocal hepatocellular carcinoma.  A separate nuclear medicine report will be generated to document dosing and post treatment Bremsstrahlung scan.    Electronically Signed   By: Arne Cleveland M.D.   On: 05/29/2013 14:40   Nm Radio Pharm Therapy Intraarterial  05/29/2013   CLINICAL DATA:  Unresectable multifocal hepatoma.  Cirrhosis.  EXAM: NUCLEAR MEDICINE RADIO PHARM THERAPY INTRA ARTERIAL; NUCLEAR MEDICINE TREATMENT PROCEDURE; NUCLEAR MEDICINE SPECIAL MED RAD PHYSICS CONS  TECHNIQUE: In conjunction with the interventional radiologist a Y- Microsphere dose was calculated  utilizing body surface area formulation. Pre therapy MAA liver SPECT scan and CTA were evaluated. Utilizing a microcatheter system, the hepatic artery was selected and Y-90 microspheres were delivered in fractionated aliquots. Radiopharmaceutical was delivered by the interventional radiologist and nuclear radiologist.  The patient tolerated procedure well. No adverse effects were noted. Bremsstrahlung scan to follow.  RADIOPHARMACEUTICALS:  43.8 MCI Y90 MICROSPHERES  COMPARISON:  04/03/2013.  FINDINGS: Y - 90 microspheres therapy as above. First therapy to the right hepatic lobe.  IMPRESSION: Successful Y - 90 microsphere delivery for treatment of unresectable multifocal hepatoma. First therapy to the right hepatic lobe.   Electronically Signed   By: Kerby Moors M.D.   On: 05/29/2013 17:15    Assessment/Plan: History of Cirrhosis/GIB s/p previous TIPS.  Hepatocellular carcinoma s/p pre Y-90 visceral arteriography and embolization on 05/06/13 with mesenteric anatomy amenable to Y-90 radio embolization, post coil embolization of right gastric and gastroduodenal arteries and [id number] test injection to the hepatic arterial tree to assess shunt function. S/p nuclear medicine imaging which revealed lung shunt fraction equals 3.2%  S/p successful Y-90 radioembolization of right hepatic artery on 05/29/13, labs and vitals stable, no pain, afebrile, groin site soft with no bleeding or hematoma. Patient is stable for discharge today from IR standpoint. Our office will call the patient to  schedule a 1 month follow-up.    Post Y-90 Radioembolization Discharge Instructions  You have been given a radioactive material during your procedure.  While it is safe for you to be discharged home from the hospital, you need to proceed directly home.    Do not use public transportation, including air travel, lasting more than 2 hours for 1 week.  Avoid crowded public places for 1 week.  Adult visitors should try to avoid close contact with you for 1 week.    Children and pregnant females should not visit or have close contact with you for 1 week.  Items that you touch are not radioactive.  Do not sleep in the same bed as your partner for 1 week, and a condom should be used for sexual activity during the first 24 hours.  Your blood may be radioactive and caution should be used if any bleeding occurs during the recovery period.  Body fluids may be radioactive for 24 hours.  Wash your hands after voiding.  Men should sit to urinate.  Dispose of any soiled materials (flush down toilet or place in trash at home) during the first day.  Drink 6 to 8 glasses of fluids per day for 5 days to hydrate yourself.  If you need to see a doctor during the first week, you must let them know that you were treated with yttrium-90 microspheres, and will be slightly radioactive.  They can call Interventional Radiology [phone] any questions.    LOS: 4 days    KHedy JacobPA-C 05/30/2013 11:39 AM

## 2013-05-31 ENCOUNTER — Other Ambulatory Visit: Payer: Self-pay | Admitting: Radiology

## 2013-06-01 LAB — CULTURE, BLOOD (ROUTINE X 2)
CULTURE: NO GROWTH
Culture: NO GROWTH

## 2013-06-03 ENCOUNTER — Encounter: Payer: Self-pay | Admitting: Endocrinology

## 2013-06-03 ENCOUNTER — Ambulatory Visit (INDEPENDENT_AMBULATORY_CARE_PROVIDER_SITE_OTHER): Payer: Medicare Other | Admitting: Endocrinology

## 2013-06-03 VITALS — BP 112/60 | HR 64 | Temp 97.5°F | Ht 75.0 in | Wt 243.0 lb

## 2013-06-03 DIAGNOSIS — E1159 Type 2 diabetes mellitus with other circulatory complications: Secondary | ICD-10-CM

## 2013-06-03 DIAGNOSIS — I2510 Atherosclerotic heart disease of native coronary artery without angina pectoris: Secondary | ICD-10-CM

## 2013-06-03 DIAGNOSIS — E1151 Type 2 diabetes mellitus with diabetic peripheral angiopathy without gangrene: Secondary | ICD-10-CM

## 2013-06-03 NOTE — Patient Instructions (Addendum)
Please stop taking the lantus, and:   take the novolog, 25 units with breakfast, 15 units with lunch and 45 units with the evening meal.  Also, if you eat at bedtime, take 5 units then. check your blood sugar 2 times a day.  vary the time of day when you check, between before the 3 meals, and at bedtime.  also check if you have symptoms of your blood sugar being too high or too low.  please keep a record of the readings and bring it to your next appointment here.  please call us sooner if you are having low blood sugar episodes.  It is very important to vary the time of day when you check, including at bedtime.   Please come back for a follow-up appointment in 2 months.

## 2013-06-03 NOTE — Progress Notes (Signed)
Subjective:    Patient ID: [name], male    DOB: 1935/08/31, 78 y.o.   MRN: [id number]  HPI Pt returns for f/u of insulin-requiring DM (dx'ed 1992, when he presented with polyuria; He has moderate neuropathy of the lower extremities, and associated PAD, and CAD; he has never had severe hypoglycemia or DKA; he takes TID novolog; the pattern of his cbg's says he does not need basal insulin).  He was recently in the hospital twice for cellulitis, and was started on lantus.  he brings a record of his cbg's which i have reviewed today.  It varies from 39-300.  It is in general higher as the day goes on.   Past Medical History  Diagnosis Date  . Diverticulosis   . Gastric antral vascular ectasia   . Rhinophyma   . Melanoma     resected by [facility] 2008  . CAD (coronary artery disease)     myoview 2007 no ischemia/ no ASA because of GI disease  . Atrial fibrillation     amiodarone in past, but problems and left off meds.  . Aortic stenosis     echo, September, 2010, mild aortic insufficiency  . Visual loss, one eye     right eye-ophthalmic arterial branch occlusion  . Internal hemorrhoids   . GERD (gastroesophageal reflux disease)   . Hypertension   . Allergic rhinitis   . Anemia     multifactorial  . Cirrhosis, alcoholic   . Thrombocytopenia   . Bleeding esophageal varices   . Encephalopathy, unspecified   . Gout   . Short-segment Barrett's esophagus     suspected  . Venous insufficiency   . Ejection fraction     EF 55%, echo, September, 2010  . Carotid bruit     Dopplers okay in the past  . Skin cancer     Basal cell and squamous cell  . Cellulitis     recurrent to RLE/notes 06/21/2012  . CHF (congestive heart failure)   . Sinus bradycardia     January, 2014  . Diastolic CHF     January, 2014  . Complication of anesthesia     "quit breathing once a long time ago; dropped my BP too; it was after I was given Dilaudid" (06/21/2012)  . Heart murmur   . Anginal pain   .  Shortness of breath     "stay that way most of the time now" (06/21/2012)  . OSA on CPAP   . Diabetes mellitus, type 2   . History of blood transfusion     "not related to OR" (06/21/2012)  . H/O hiatal hernia   . Osteoarthritis     "ALL OVER" (06/21/2012)  . Chronic lower back pain   . Aspiration into airway-silent on modified barium swallow 03/20/2013    Past Surgical History  Procedure Laterality Date  . Total knee arthroplasty Right   . Esophagogastroduodenoscopy  04/10/2008    esophageal varices, cardia varix  . Tips procedure  2/10  . Back surgery      X5 "for ruptured disc; last time did a fusion" (06/21/2012)  . Excisional hemorrhoidectomy    . Release scar contracture / graft repairs of hand      bilaterally   . Elbow surgery Right   . Hiatal hernia repair    . Colonoscopy w/ polypectomy  02/14/2007    adenomatous polyps, diverticulosis, internal hemorrhoids/rectal varices  . Cataract extraction, bilateral  January 2013  . Tonsillectomy    .  Appendectomy    . Joint replacement    . Coronary artery bypass graft  1993    6 Bypass  . Cataract extraction w/ intraocular lens  implant, bilateral      History   Social History  . Marital Status: Married    Spouse Name: N/A    Number of Children: N/A  . Years of Education: N/A   Occupational History  .      retired   Social History Main Topics  . Smoking status: Former Smoker -- 2.00 packs/day for 20 years    Types: Cigarettes    Quit date: 02/22/1975  . Smokeless tobacco: Never Used  . Alcohol Use: No     Comment: 06/21/2012 "none since Feb 2010"  . Drug Use: No  . Sexual Activity: No   Other Topics Concern  . Not on file   Social History Narrative   Patient does not get regular exercise   Quit smoking over 40 years ago    Current Outpatient Prescriptions on File Prior to Visit  Medication Sig Dispense Refill  . acetaminophen (TYLENOL) 500 MG tablet Take 1,000 mg by mouth every 6 (six) hours as needed for  mild pain.       Marland Kitchen allopurinol (ZYLOPRIM) 300 MG tablet Take 150 mg by mouth every morning.       . Ascorbic Acid (VITAMIN C) 500 MG tablet Take 500 mg by mouth daily.        . Calcium-Magnesium-Zinc (CALCIUM/MAGNESIUM/ZINC FORMULA) 1000-500-50 MG TABS Take 1 tablet by mouth every morning.       . fentaNYL (DURAGESIC - DOSED MCG/HR) 100 MCG/HR Place 100 mcg onto the skin every 3 (three) days.      . Ferrous Gluconate (IRON) 240 (27 FE) MG TABS Take 1 tablet by mouth 2 (two) times daily.       . furosemide (LASIX) 40 MG tablet Take 1 tablet (40 mg total) by mouth 2 (two) times daily.  180 tablet  1  . gabapentin (NEURONTIN) 300 MG capsule Take 1 capsule (300 mg total) by mouth 3 (three) times daily.  90 capsule  0  . glucose blood (FREESTYLE LITE) test strip 1 each by Other route 2 (two) times daily. And lancets: Dx code 250.01  100 each  5  . HYDROcodone-acetaminophen (NORCO/VICODIN) 5-325 MG per tablet Take 1-2 tablets by mouth every 4 (four) hours as needed for moderate pain.  30 tablet  0  . insulin aspart (NOVOLOG) 100 UNIT/ML injection Inject 10-40 Units into the skin 3 (three) times daily before meals. 25 units with breakfast, 15 units with lunch and 45 units with the evening meal      . Insulin Syringe-Needle U-100 (INSULIN SYRINGE .5CC/31GX5/16") 31G X 5/16" 0.5 ML MISC Use one syringe 4 times daily. Dx code 250.01  120 each  6  . isosorbide mononitrate (IMDUR) 60 MG 24 hr tablet Take 60 mg by mouth every morning.       . lactulose (CHRONULAC) 10 GM/15ML solution Take 30 mLs (20 g total) by mouth 2 (two) times daily.  1892 mL  5  . loratadine (CLARITIN) 10 MG tablet Take 10 mg by mouth every morning.       . metoprolol tartrate (LOPRESSOR) 25 MG tablet Take 0.5 tablets (12.5 mg total) by mouth 2 (two) times daily.  30 tablet  1  . nitroGLYCERIN (NITROSTAT) 0.4 MG SL tablet Place 1 tablet (0.4 mg total) under the tongue every 5 (five) minutes as  needed for chest pain.  20 tablet  0  .  omeprazole (PRILOSEC OTC) 20 MG tablet Take 20 mg by mouth daily.      . ondansetron (ZOFRAN) 8 MG tablet Take 1 tablet (8 mg total) by mouth every 12 (twelve) hours as needed for nausea or vomiting.  30 tablet  0  . penicillin v potassium (VEETID) 250 MG tablet Take 250 mg by mouth daily.      . polyethylene glycol (MIRALAX / GLYCOLAX) packet Take 17 g by mouth daily as needed. Take two or three times a week      . predniSONE (DELTASONE) 5 MG tablet Take 5 mg by mouth daily.       . rifaximin (XIFAXAN) 550 MG TABS tablet Take 550 mg by mouth 2 (two) times daily.      Marland Kitchen saccharomyces boulardii (FLORASTOR) 250 MG capsule Take 1 capsule (250 mg total) by mouth 2 (two) times daily.  60 capsule  0  . spironolactone (ALDACTONE) 50 MG tablet Take 1 tablet (50 mg total) by mouth daily.  90 tablet  1  . Thiamine HCl (VITAMIN B-1) 250 MG tablet Take 125 mg by mouth daily.        No current facility-administered medications on file prior to visit.    Allergies  Allergen Reactions  . Shellfish-Derived Products     Rash & angioedema  . Doxycycline Hyclate     Diarrhea   . Amoxicillin-Pot Clavulanate     Unsure what reaction was but pt tolerated penicillin 05/25/2012  . Aspirin     REACTION: unable to tolerate due to cirrhosis and varices  . Buprenorphine Hcl-Naloxone Hcl Other (See Comments)    unresponsive  . Codeine     Rash    . Ezetimibe     REACTION: LEG/JOINT PAIN  . Hydromorphone Hcl     unknown  . Meperidine Hcl     unknown  . Morphine     unknown  . Morphine Sulfate     REACTION: unspecified  . Oxycodone     hallucinations  . Sulfonamide Derivatives     Family History  Problem Relation Age of Onset  . Alzheimer's disease Mother   . Cancer Father     GI (stomach?)  . Heart disease Sister     2 sisters  . Liver disease Sister   . Diabetes Brother   . Heart disease Brother     3 brothers  . Liver disease Brother   . Heart attack Other     sibling  . Melanoma Other       sibling  . Stroke Other     sibling  . Colon cancer Neg Hx     BP 112/60  Pulse 64  Temp(Src) 97.5 F (36.4 C) (Oral)  Ht 6\' 3"  (1.905 m)  Wt 243 lb (110.224 kg)  BMI 30.37 kg/m2  SpO2 97%  Review of Systems He has lost a few lbs.  He denies LOC.      Objective:   Physical Exam VITAL SIGNS:  See vs page GENERAL: no distress SKIN:  Insulin injection sites at the triceps are normal.       Assessment & Plan:  DM: The pattern of his cbg's indicates he needs some adjustment in his therapy Liver cancer: this is causing weight loss.  He probably needs a reduced insulin dosage.

## 2013-06-04 ENCOUNTER — Telehealth: Payer: Self-pay | Admitting: Radiology

## 2013-06-04 ENCOUNTER — Other Ambulatory Visit: Payer: Self-pay | Admitting: Radiology

## 2013-06-04 DIAGNOSIS — C220 Liver cell carcinoma: Secondary | ICD-10-CM

## 2013-06-04 NOTE — Telephone Encounter (Signed)
1 wk s/p:  [place]  Patient's wife states that Mr Shere has been feeling fatigued.  Minimal nausea.  Minimal pain.  Has only required pain meds x 2 post Y-90.  4 wk f/u appointment scheduled for 06/26/2013.  CMET to be obtained week of June 17, 2013 at [place].   Kendell Gammon Riki Rusk, [place] 06/04/2013 2:07 PM

## 2013-06-05 ENCOUNTER — Ambulatory Visit (HOSPITAL_BASED_OUTPATIENT_CLINIC_OR_DEPARTMENT_OTHER): Payer: Medicare Other | Admitting: Internal Medicine

## 2013-06-05 ENCOUNTER — Encounter: Payer: Self-pay | Admitting: Internal Medicine

## 2013-06-05 ENCOUNTER — Other Ambulatory Visit (HOSPITAL_BASED_OUTPATIENT_CLINIC_OR_DEPARTMENT_OTHER): Payer: Medicare Other

## 2013-06-05 ENCOUNTER — Telehealth: Payer: Self-pay | Admitting: Internal Medicine

## 2013-06-05 VITALS — BP 142/56 | HR 59 | Temp 97.8°F | Resp 19 | Ht 75.0 in | Wt 243.6 lb

## 2013-06-05 DIAGNOSIS — C228 Malignant neoplasm of liver, primary, unspecified as to type: Secondary | ICD-10-CM

## 2013-06-05 DIAGNOSIS — I4891 Unspecified atrial fibrillation: Secondary | ICD-10-CM

## 2013-06-05 DIAGNOSIS — L03119 Cellulitis of unspecified part of limb: Secondary | ICD-10-CM

## 2013-06-05 DIAGNOSIS — L03115 Cellulitis of right lower limb: Secondary | ICD-10-CM

## 2013-06-05 DIAGNOSIS — C220 Liver cell carcinoma: Secondary | ICD-10-CM

## 2013-06-05 DIAGNOSIS — K7030 Alcoholic cirrhosis of liver without ascites: Secondary | ICD-10-CM

## 2013-06-05 DIAGNOSIS — D696 Thrombocytopenia, unspecified: Secondary | ICD-10-CM

## 2013-06-05 DIAGNOSIS — R188 Other ascites: Secondary | ICD-10-CM

## 2013-06-05 DIAGNOSIS — L02419 Cutaneous abscess of limb, unspecified: Secondary | ICD-10-CM

## 2013-06-05 DIAGNOSIS — D509 Iron deficiency anemia, unspecified: Secondary | ICD-10-CM

## 2013-06-05 LAB — COMPREHENSIVE METABOLIC PANEL (CC13)
ALK PHOS: 76 U/L (ref 40–150)
ALT: 17 U/L (ref 0–55)
ANION GAP: 8 meq/L (ref 3–11)
AST: 35 U/L — ABNORMAL HIGH (ref 5–34)
Albumin: 3.3 g/dL — ABNORMAL LOW (ref 3.5–5.0)
BILIRUBIN TOTAL: 1.44 mg/dL — AB (ref 0.20–1.20)
BUN: 16.7 mg/dL (ref 7.0–26.0)
CO2: 27 meq/L (ref 22–29)
CREATININE: 1.2 mg/dL (ref 0.7–1.3)
Calcium: 9.3 mg/dL (ref 8.4–10.4)
Chloride: 106 mEq/L (ref 98–109)
Glucose: 178 mg/dl — ABNORMAL HIGH (ref 70–140)
Potassium: 3.8 mEq/L (ref 3.5–5.1)
SODIUM: 141 meq/L (ref 136–145)
TOTAL PROTEIN: 6.5 g/dL (ref 6.4–8.3)

## 2013-06-05 LAB — CBC WITH DIFFERENTIAL/PLATELET
BASO%: 0.4 % (ref 0.0–2.0)
Basophils Absolute: 0 10*3/uL (ref 0.0–0.1)
EOS%: 0 % (ref 0.0–7.0)
Eosinophils Absolute: 0 10*3/uL (ref 0.0–0.5)
HEMATOCRIT: 35.8 % — AB (ref 38.4–49.9)
HGB: 11.7 g/dL — ABNORMAL LOW (ref 13.0–17.1)
LYMPH%: 11.4 % — AB (ref 14.0–49.0)
MCH: 31.7 pg (ref 27.2–33.4)
MCHC: 32.6 g/dL (ref 32.0–36.0)
MCV: 97.3 fL (ref 79.3–98.0)
MONO#: 0.5 10*3/uL (ref 0.1–0.9)
MONO%: 8.1 % (ref 0.0–14.0)
NEUT%: 80.1 % — ABNORMAL HIGH (ref 39.0–75.0)
NEUTROS ABS: 4.8 10*3/uL (ref 1.5–6.5)
Platelets: 68 10*3/uL — ABNORMAL LOW (ref 140–400)
RBC: 3.67 10*6/uL — AB (ref 4.20–5.82)
RDW: 14.6 % (ref 11.0–14.6)
WBC: 6 10*3/uL (ref 4.0–10.3)
lymph#: 0.7 10*3/uL — ABNORMAL LOW (ref 0.9–3.3)

## 2013-06-05 NOTE — Telephone Encounter (Signed)
Gave pt appt for labs And Md for MAy 2015

## 2013-06-05 NOTE — Progress Notes (Signed)
[facility] OFFICE PROGRESS NOTE  [name] Cobble, MD [phone] [place] [address]  DIAGNOSIS: Cellulitis of right lower extremity  CIRRHOSIS, ALCOHOLIC  Atrial fibrillation  ANEMIA, IRON DEFICIENCY   ASCITES  Hepatocellular carcinoma - Plan: CBC with Differential, Protime-INR, Comprehensive metabolic panel (Cmet) - [facility], AFP tumor marker, CT Chest W Contrast  Chief Complaint  Patient presents with  . HCC (hepatocellular carcinoma)    CURRENT TREATMENT:  Y-90 radioembolization by Dr. Arne Cleveland.  INTERVAL HISTORY: [name] 78 y.o. male with a history of cirrhosis complicated by encephalopathy s/p TIPS is here for follow-up.   He was last seen by me on 05/08/2013.    He underwent Y-90 radioembolization by Dr. Vernard Gambles on [id number] without complications. He also was discharged on Sunday (04/29/2013- 05/05/2013) for acute on chronic cellulitis of the right leg and C. Difficile colitis with fevers. He responded with broad spectrum antibiotics (vancomycin and zosyn) which was narrowed on discharge to doxycycline.  R Lower extremity duplex was negative for DVT. He was discharged with oral vancomycin to finish a 14-day course for his C. Difficile. He was readmitted on Easter Sunday, 04/05 and discharge on 04/09 for recurrent cellulitis of R L Extremity.  He was instructed to take the penicillin that he has been on previously.  He had another Y-90 radioembolization by Dr. Vernard Gambles on on Wednesday, 04/08.   He reports some minor pain that was relieved with pain medications.  He had elevated glucoses as well and saw endocrinologist Dr. Loanne Drilling on 4/13 with an increase in novolog and stopped his long-acting.  His glucoses are leveling off reported by his wife.    As noted previously, he had labs collected on 04/05/2013 demonstrating an AFP-tumor marker of 44.8 up from 2.5 nearly one year ago. His CBC on 04/17/2013 revealed a WBC of 4.3; hemoglobin of 11.6; HCT of 34.5 and  platelets of 48. His INR was 1.36. He had a MR of the liver with and without contrast on 04/03/2013 which showed cirrhosis with multiple anterior segment right liver lobe lesions, most consistent with multifocal HCC. There was also portal venous hypertension with chronic non-occulusive wall thrombus in the splenic venin and splenoportal confluence. His TIPS shunt catheter was patent.  He was referred for US biopsy of liver lesion by Dr. Laurence Ferrari of Interventional radiology and based on MRI he has a total of 4 (> 1cm) lesions that all demonstrate arterial enhancement and delayed washout which is diagnostic of multifocal HCC by both AASLD and UNOS/OPTN criteria. A biopsy was not necessary. He was also seen by Dr. Loanne Drilling on 04/01/2013 for management of his insulin-requrining diabetes. He was was seen by Dr. Carlean Purl of Gastroenterology on 03/20/2013. Dr. Godfrey Pick had dilated his GE junction stenosis stricure area up to 18 mm in December, 2014 with the patient reporting improvement in his swallowing.   Today, he is accompanied by his wife [name]. He complains of his tongue hurting without bleeding.  He reports some mild dysphagia since December as noted above.  Diarrhea has resolved.  He follows [facility] for his extensive cardiac history. He ambulates with the assistance of a cane. He also notes occasional blurry vision and feeling as if his ears are stopped up.    MEDICAL HISTORY: Past Medical History  Diagnosis Date  . Diverticulosis   . Gastric antral vascular ectasia   . Rhinophyma   . Melanoma     resected by [facility] 2008  . CAD (coronary artery  disease)     myoview 2007 no ischemia/ no ASA because of GI disease  . Atrial fibrillation     amiodarone in past, but problems and left off meds.  . Aortic stenosis     echo, September, 2010, mild aortic insufficiency  . Visual loss, one eye     right eye-ophthalmic arterial branch occlusion  . Internal hemorrhoids   . GERD (gastroesophageal reflux  disease)   . Hypertension   . Allergic rhinitis   . Anemia     multifactorial  . Cirrhosis, alcoholic   . Thrombocytopenia   . Bleeding esophageal varices   . Encephalopathy, unspecified   . Gout   . Short-segment Barrett's esophagus     suspected  . Venous insufficiency   . Ejection fraction     EF 55%, echo, September, 2010  . Carotid bruit     Dopplers okay in the past  . Skin cancer     Basal cell and squamous cell  . Cellulitis     recurrent to RLE/notes 06/21/2012  . CHF (congestive heart failure)   . Sinus bradycardia     January, 2014  . Diastolic CHF     January, 2014  . Complication of anesthesia     "quit breathing once a long time ago; dropped my BP too; it was after I was given Dilaudid" (06/21/2012)  . Heart murmur   . Anginal pain   . Shortness of breath     "stay that way most of the time now" (06/21/2012)  . OSA on CPAP   . Diabetes mellitus, type 2   . History of blood transfusion     "not related to OR" (06/21/2012)  . H/O hiatal hernia   . Osteoarthritis     "ALL OVER" (06/21/2012)  . Chronic lower back pain   . Aspiration into airway-silent on modified barium swallow 03/20/2013    INTERIM HISTORY: has HYPERLIPIDEMIA; GOUT NOS; ANEMIA, IRON DEFICIENCY; THROMBOCYTOPENIA, CHRONIC; ARTERIAL BRANCH OCCLUSION OF RETINA; HYPERTENSION, ESSENTIAL NOS; ALLERGIC RHINITIS; GERD; CIRRHOSIS, ALCOHOLIC; Hepatic encephalopathy; HYPERPLASIA, PRST NOS W/O URINARY OBST/LUTS; OSTEOARTHRITIS; LOW BACK PAIN, CHRONIC; SLEEP APNEA;  ASCITES; HYPERURICEMIA; COLONIC POLYPS, ADENOMATOUS, HX OF; CORONARY ARTERY BYPASS GRAFT, HX OF; NEUROPATHY; Paresthesia of foot; Ejection fraction; Carotid bruit; CAD (coronary artery disease); Atrial fibrillation; Unspecified adverse effect of unspecified drug, medicinal and biological substance; DM2 (diabetes mellitus, type 2); Edema; Sinus bradycardia; Aortic stenosis; Diastolic CHF; Congestive heart failure; Cellulitis of right lower extremity; DM  (diabetes mellitus) with peripheral vascular complication; Lymphedema of lower extremity; Venous stasis of lower extremity; Open wound of knee, leg (except thigh), and ankle, complicated; Dysphagia, [id number]); Left arm pain; Aspiration into airway-silent on modified barium swallow; Leg ulcer, left; HCC (hepatocellular carcinoma); Malnutrition of moderate degree; Enteritis due to Clostridium difficile; Hepatocellular carcinoma; and Cellulitis on his problem list.    ALLERGIES:  is allergic to shellfish-derived products; doxycycline hyclate; amoxicillin-pot clavulanate; aspirin; buprenorphine hcl-naloxone hcl; codeine; ezetimibe; hydromorphone hcl; meperidine hcl; morphine; morphine sulfate; oxycodone; and sulfonamide derivatives.  MEDICATIONS: has a current medication list which includes the following prescription(s): acetaminophen, allopurinol, vitamin c, calcium-magnesium-zinc, fentanyl, iron, furosemide, gabapentin, glucose blood, hydrocodone-acetaminophen, insulin aspart, insulin syringe .5cc/31gx5/16", isosorbide mononitrate, lactulose, loratadine, metoprolol tartrate, nitroglycerin, omeprazole, ondansetron, penicillin v potassium, polyethylene glycol, prednisone, rifaximin, spironolactone, vitamin b-1, and saccharomyces boulardii.  SURGICAL HISTORY:  Past Surgical History  Procedure Laterality Date  . Total knee arthroplasty Right   . Esophagogastroduodenoscopy  04/10/2008    esophageal varices, cardia varix  .  Tips procedure  2/10  . Back surgery      X5 "for ruptured disc; last time did a fusion" (06/21/2012)  . Excisional hemorrhoidectomy    . Release scar contracture / graft repairs of hand      bilaterally   . Elbow surgery Right   . Hiatal hernia repair    . Colonoscopy w/ polypectomy  02/14/2007    adenomatous polyps, diverticulosis, internal hemorrhoids/rectal varices  . Cataract extraction, bilateral  January 2013  . Tonsillectomy    . Appendectomy    . Joint replacement     . Coronary artery bypass graft  1993    6 Bypass  . Cataract extraction w/ intraocular lens  implant, bilateral      REVIEW OF SYSTEMS:   Constitutional: Denies fevers, chills or abnormal weight loss Eyes: Denies blurriness of vision Ears, nose, mouth, throat, and face: Denies mucositis or sore throat Respiratory: Denies cough, dyspnea or wheezes Cardiovascular: Denies palpitation, chest discomfort or lower extremity swelling Gastrointestinal:  Denies nausea, heartburn or change in bowel habits Skin: Denies abnormal skin rashes Lymphatics: Denies new lymphadenopathy or easy bruising Neurological:Denies numbness, tingling or new weaknesses Behavioral/Psych: Mood is stable, no new changes  All other systems were reviewed with the patient and are negative.  PHYSICAL EXAMINATION: ECOG PERFORMANCE STATUS: 2 - Symptomatic, <50% confined to bed  Blood pressure 142/56, pulse 59, temperature 97.8 F (36.6 C), temperature source Oral, resp. rate 19, height 6\' 3"  (1.905 m), weight 243 lb 9.6 oz (110.496 kg).  GENERAL:alert, no distress and comfortable; Ambulates to the bench cautiously, independently  SKIN: skin color, texture, turgor are normal, no rashes or significant lesions; + gynecomastia ; R leg with reddness to mid calf level with support stockings on EYES: normal, Conjunctiva are pink and non-injected, sclera clear  EARS: TM clear bilaterally without evidence of effusion.  OROPHARYNX:no exudate, no erythema and lips, buccal mucosa, and tongue normal  NECK: supple, thyroid normal size, non-tender, without nodularity  LYMPH: no palpable lymphadenopathy in the cervical, axillary or inguinal  LUNGS: clear to auscultation and percussion with normal breathing effort  HEART: regular rate & rhythm and no murmurs and 1 + extremity edema  ABDOMEN:abdomen soft, non-tender and normal bowel sounds; distended with mild fluid; No additional stigmata of liver disease, i.e., caput medusa   Musculoskeletal:no cyanosis of digits and no clubbing  NEURO: alert & oriented x 3 with fluent speech, no focal motor/sensory deficits; No asterix   Labs:  Lab Results  Component Value Date   WBC 6.0 06/05/2013   HGB 11.7* 06/05/2013   HCT 35.8* 06/05/2013   MCV 97.3 06/05/2013   PLT 68* 06/05/2013   NEUTROABS 4.8 06/05/2013      Chemistry      Component Value Date/Time   NA 141 06/05/2013 1242   NA 137 05/30/2013 0330   K 3.8 06/05/2013 1242   K 4.8 05/30/2013 0330   CL 97 05/30/2013 0330   CO2 27 06/05/2013 1242   CO2 29 05/30/2013 0330   BUN 16.7 06/05/2013 1242   BUN 23 05/30/2013 0330   CREATININE 1.2 06/05/2013 1242   CREATININE 1.24 05/30/2013 0330   CREATININE 1.28 01/14/2013 1553      Component Value Date/Time   CALCIUM 9.3 06/05/2013 1242   CALCIUM 9.2 05/30/2013 0330   ALKPHOS 76 06/05/2013 1242   ALKPHOS 52 05/29/2013 0334   AST 35* 06/05/2013 1242   AST 18 05/29/2013 0334   ALT 17 06/05/2013 1242  ALT 8 05/29/2013 0334   BILITOT 1.44* 06/05/2013 1242   BILITOT 1.0 05/29/2013 0334       Basic Metabolic Panel:  Recent Labs Lab 05/30/13 0330 06/05/13 1242  NA 137 141  K 4.8 3.8  CL 97  --   CO2 29 27  GLUCOSE 354* 178*  BUN 23 16.7  CREATININE 1.24 1.2  CALCIUM 9.2 9.3   GFR Estimated Creatinine Clearance: 71.4 ml/min (by C-G formula based on Cr of 1.2). Liver Function Tests:  Recent Labs Lab 06/05/13 1242  AST 35*  ALT 17  ALKPHOS 76  BILITOT 1.44*  PROT 6.5  ALBUMIN 3.3*   No results found for this basename: LIPASE, AMYLASE,  in the last 168 hours No results found for this basename: AMMONIA,  in the last 168 hours Coagulation profile No results found for this basename: INR, PROTIME,  in the last 168 hours  CBC:  Recent Labs Lab 05/30/13 0330 06/05/13 1242  WBC 5.1 6.0  NEUTROABS  --  4.8  HGB 10.2* 11.7*  HCT 31.2* 35.8*  MCV 95.4 97.3  PLT 56* 68*    Anemia work up No results found for this basename: VITAMINB12, FOLATE, FERRITIN, TIBC, IRON,  RETICCTPCT,  in the last 72 hours  Studies:  No results found.   RADIOGRAPHIC STUDIES: Nm Liver Img Spect  05/06/2013   EXAM: NUCLEAR MEDICINE LIVER SCAN with SPECT  TECHNIQUE: Abdominal images were obtained in multiple projections after intravenous injection of radiopharmaceutical. Post processing fusion with CT performed.  RADIOPHARMACEUTICALS:  3.3MILLI CURIE MAA TECHNETIUM TO 71M ALBUMIN AGGREGATED  COMPARISON:  IR ANGIO/VISCERAL dated 05/06/2013; CT ABDOMEN W/O CM dated 03/21/2013  FINDINGS: Intra-arterial injected MAA localizes to the liver. There is a curvilinear focus of activity extending anterior to the right hepatic lobe which may represent a vascular structure. No evidence of bowel or stomach uptake.  Lung shunt fraction equals 3.2%.  IMPRESSION: 1. Curvilinear activity anterior to the right hepatic lobe may represent a vascular structure such as a falciform ligament artery. 2. Lung shunt fraction equals 3.2%. Findings conveyed toDAYNE HASSELL III on 05/06/2013  at17:52.   Electronically Signed   By: Suzy Bouchard M.D.   On: 05/06/2013 17:54   Ir Angiogram Visceral Selective  05/06/2013   CLINICAL DATA:  Cirrhosis, post TIPS. Multifocal hepatoma. Preop planning for Y 90 radio embolization.  EXAM: ADDITIONAL ARTERIOGRAPHY; SELECTIVE VISCERAL ARTERIOGRAPHY; IR ULTRASOUND GUIDANCE VASC ACCESS RIGHT; IR EMBO TUMOR ORGAN ISCHEMIA INFARCT INC GUIDE ROADMAPPING  MEDICATIONS: Intravenous Fentanyl and Versed were administered as conscious sedation during continuous cardiorespiratory monitoring by the radiology RN, with a total moderate sedation time of 100 minutes.  CONTRAST:  142mL OMNIPAQUE IOHEXOL 300 MG/ML  SOLN  FLUOROSCOPY TIME:  20 min 24 seconds  PROCEDURE: The procedure, risks (including but not limited to bleeding, infection, organ damage ), benefits, and alternatives were explained to the patient. Questions regarding the procedure were encouraged and answered. The patient understands and  consents to the procedure.  Right femoral region prepped and draped in usual sterile fashion. Maximal barrier sterile technique was utilized including caps, mask, sterile gowns, sterile gloves, sterile drape, hand hygiene and skin antiseptic. After the skin was infiltrated with 1% lidocaine, the right common femoral artery was accessed with a 21 gauge micropuncture needle under real-time ultrasound guidance. Needle exchanged over 018 guidewire for transitional dilator. This allowed passage of a Bentson wire into the abdominal aorta. Over this, a 6 [place] vascular sheath was placed, through which a  5 French C2 catheter was advanced and used to selectively catheterize the superior mesenteric artery for selective arteriography. The C2 was then utilized to selectively catheterize the celiac axis for selective arteriography. The C2 catheter was then advanced into the common hepatic artery for selective arteriography. The C2 catheter was then advanced into the proximal gastroduodenal artery. A renegade micro catheter was coaxially advanced and coil embolization of the gastroduodenal artery performed with 5 and 6 mm interlock coils. The renegade micro catheter was then used to selectively catheterize the left hepatic artery for selective arteriography. The renegade micro catheter was withdrawn, and the C2 advanced to selectively catheterize the right hepatic artery for selective arteriography. The C2 was then pulled back into the common hepatic artery for follow-up arteriography of the embolization. After this, this renegade micro catheter was used to selectively catheterize the gastroduodenal artery for additional embolization using a 4 and 5 coils. The renegade micro catheter was then advanced into the Right gastric artery for coil embolization using 3 mm coils. After followup arteriography through the C2 catheter, additional coil embolization of the gastroduodenal artery and right gastric artery performed. After final  follow-up arteriography, the renegade micro catheter was advanced into the proper hepatic artery just proximal to its bifurcation for delivery of theTc57mMAA test dose to the hepatic arterial bed. The micro catheter and C2 catheter were withdrawn. After confirmatory femoral arteriography, sheath was removed and hemostasis achieved with the aid of the Exoseal device. The patient tolerated the procedure well.  Complications: None  FINDINGS: Superior mesenteric arteriography shows the vessel to be widely patent, with no significant supply to the hepatic arterial tree.  Celiac arteriography demonstrates widely patent common hepatic and splenic arteries. There is a large gastroduodenal artery supplied by the common hepatic artery. The right gastric artery originates from the proximal left hepatic artery. There is good antegrade flow into the left and right hepatic artery is and intrahepatic branches. Minimal tumor staining is evident. Patency of the TIPS stent not assessed on these arterial injections. The gastroduodenal artery was embolized using multiple coils as above. The right gastric artery was embolized using multiple coils as above. The results of the MAA test bolus to the hepatic arterial tree will be reported separately.  IMPRESSION: 1. Mesenteric anatomy amenable to Y-90 radio embolization, post coil embolization of right gastric and gastroduodenal arteries. 2. Results of Tc47mMAA test injection to the hepatic arterial tree to assess shunt function will be reported separately.   Electronically Signed   By: Arne Cleveland M.D.   On: 05/06/2013 16:44   Ir Angiogram Visceral Selective  05/06/2013   CLINICAL DATA:  Cirrhosis, post TIPS. Multifocal hepatoma. Preop planning for Y 90 radio embolization.  EXAM: ADDITIONAL ARTERIOGRAPHY; SELECTIVE VISCERAL ARTERIOGRAPHY; IR ULTRASOUND GUIDANCE VASC ACCESS RIGHT; IR EMBO TUMOR ORGAN ISCHEMIA INFARCT INC GUIDE ROADMAPPING  MEDICATIONS: Intravenous Fentanyl and Versed  were administered as conscious sedation during continuous cardiorespiratory monitoring by the radiology RN, with a total moderate sedation time of 100 minutes.  CONTRAST:  157mL OMNIPAQUE IOHEXOL 300 MG/ML  SOLN  FLUOROSCOPY TIME:  20 min 24 seconds  PROCEDURE: The procedure, risks (including but not limited to bleeding, infection, organ damage ), benefits, and alternatives were explained to the patient. Questions regarding the procedure were encouraged and answered. The patient understands and consents to the procedure.  Right femoral region prepped and draped in usual sterile fashion. Maximal barrier sterile technique was utilized including caps, mask, sterile gowns, sterile gloves, sterile drape, hand hygiene and  skin antiseptic. After the skin was infiltrated with 1% lidocaine, the right common femoral artery was accessed with a 21 gauge micropuncture needle under real-time ultrasound guidance. Needle exchanged over 018 guidewire for transitional dilator. This allowed passage of a Bentson wire into the abdominal aorta. Over this, a 6 [place] vascular sheath was placed, through which a 5 [place] C2 catheter was advanced and used to selectively catheterize the superior mesenteric artery for selective arteriography. The C2 was then utilized to selectively catheterize the celiac axis for selective arteriography. The C2 catheter was then advanced into the common hepatic artery for selective arteriography. The C2 catheter was then advanced into the proximal gastroduodenal artery. A renegade micro catheter was coaxially advanced and coil embolization of the gastroduodenal artery performed with 5 and 6 mm interlock coils. The renegade micro catheter was then used to selectively catheterize the left hepatic artery for selective arteriography. The renegade micro catheter was withdrawn, and the C2 advanced to selectively catheterize the right hepatic artery for selective arteriography. The C2 was then pulled back into the  common hepatic artery for follow-up arteriography of the embolization. After this, this renegade micro catheter was used to selectively catheterize the gastroduodenal artery for additional embolization using a 4 and 5 coils. The renegade micro catheter was then advanced into the Right gastric artery for coil embolization using 3 mm coils. After followup arteriography through the C2 catheter, additional coil embolization of the gastroduodenal artery and right gastric artery performed. After final follow-up arteriography, the renegade micro catheter was advanced into the proper hepatic artery just proximal to its bifurcation for delivery of theTc69mMAA test dose to the hepatic arterial bed. The micro catheter and C2 catheter were withdrawn. After confirmatory femoral arteriography, sheath was removed and hemostasis achieved with the aid of the Exoseal device. The patient tolerated the procedure well.  Complications: None  FINDINGS: Superior mesenteric arteriography shows the vessel to be widely patent, with no significant supply to the hepatic arterial tree.  Celiac arteriography demonstrates widely patent common hepatic and splenic arteries. There is a large gastroduodenal artery supplied by the common hepatic artery. The right gastric artery originates from the proximal left hepatic artery. There is good antegrade flow into the left and right hepatic artery is and intrahepatic branches. Minimal tumor staining is evident. Patency of the TIPS stent not assessed on these arterial injections. The gastroduodenal artery was embolized using multiple coils as above. The right gastric artery was embolized using multiple coils as above. The results of the MAA test bolus to the hepatic arterial tree will be reported separately.  IMPRESSION: 1. Mesenteric anatomy amenable to Y-90 radio embolization, post coil embolization of right gastric and gastroduodenal arteries. 2. Results of Tc33mMAA test injection to the hepatic arterial  tree to assess shunt function will be reported separately.   Electronically Signed   By: Arne Cleveland M.D.   On: 05/06/2013 16:44   Ir Angiogram Selective Each Additional Vessel  05/06/2013   CLINICAL DATA:  Cirrhosis, post TIPS. Multifocal hepatoma. Preop planning for Y 90 radio embolization.  EXAM: ADDITIONAL ARTERIOGRAPHY; SELECTIVE VISCERAL ARTERIOGRAPHY; IR ULTRASOUND GUIDANCE VASC ACCESS RIGHT; IR EMBO TUMOR ORGAN ISCHEMIA INFARCT INC GUIDE ROADMAPPING  MEDICATIONS: Intravenous Fentanyl and Versed were administered as conscious sedation during continuous cardiorespiratory monitoring by the radiology RN, with a total moderate sedation time of 100 minutes.  CONTRAST:  162mL OMNIPAQUE IOHEXOL 300 MG/ML  SOLN  FLUOROSCOPY TIME:  20 min 24 seconds  PROCEDURE: The procedure, risks (including but not  limited to bleeding, infection, organ damage ), benefits, and alternatives were explained to the patient. Questions regarding the procedure were encouraged and answered. The patient understands and consents to the procedure.  Right femoral region prepped and draped in usual sterile fashion. Maximal barrier sterile technique was utilized including caps, mask, sterile gowns, sterile gloves, sterile drape, hand hygiene and skin antiseptic. After the skin was infiltrated with 1% lidocaine, the right common femoral artery was accessed with a 21 gauge micropuncture needle under real-time ultrasound guidance. Needle exchanged over 018 guidewire for transitional dilator. This allowed passage of a Bentson wire into the abdominal aorta. Over this, a 6 [place] vascular sheath was placed, through which a 5 [place] C2 catheter was advanced and used to selectively catheterize the superior mesenteric artery for selective arteriography. The C2 was then utilized to selectively catheterize the celiac axis for selective arteriography. The C2 catheter was then advanced into the common hepatic artery for selective arteriography. The C2  catheter was then advanced into the proximal gastroduodenal artery. A renegade micro catheter was coaxially advanced and coil embolization of the gastroduodenal artery performed with 5 and 6 mm interlock coils. The renegade micro catheter was then used to selectively catheterize the left hepatic artery for selective arteriography. The renegade micro catheter was withdrawn, and the C2 advanced to selectively catheterize the right hepatic artery for selective arteriography. The C2 was then pulled back into the common hepatic artery for follow-up arteriography of the embolization. After this, this renegade micro catheter was used to selectively catheterize the gastroduodenal artery for additional embolization using a 4 and 5 coils. The renegade micro catheter was then advanced into the Right gastric artery for coil embolization using 3 mm coils. After followup arteriography through the C2 catheter, additional coil embolization of the gastroduodenal artery and right gastric artery performed. After final follow-up arteriography, the renegade micro catheter was advanced into the proper hepatic artery just proximal to its bifurcation for delivery of theTc69mMAA test dose to the hepatic arterial bed. The micro catheter and C2 catheter were withdrawn. After confirmatory femoral arteriography, sheath was removed and hemostasis achieved with the aid of the Exoseal device. The patient tolerated the procedure well.  Complications: None  FINDINGS: Superior mesenteric arteriography shows the vessel to be widely patent, with no significant supply to the hepatic arterial tree.  Celiac arteriography demonstrates widely patent common hepatic and splenic arteries. There is a large gastroduodenal artery supplied by the common hepatic artery. The right gastric artery originates from the proximal left hepatic artery. There is good antegrade flow into the left and right hepatic artery is and intrahepatic branches. Minimal tumor staining is  evident. Patency of the TIPS stent not assessed on these arterial injections. The gastroduodenal artery was embolized using multiple coils as above. The right gastric artery was embolized using multiple coils as above. The results of the MAA test bolus to the hepatic arterial tree will be reported separately.  IMPRESSION: 1. Mesenteric anatomy amenable to Y-90 radio embolization, post coil embolization of right gastric and gastroduodenal arteries. 2. Results of Tc21mMAA test injection to the hepatic arterial tree to assess shunt function will be reported separately.   Electronically Signed   By: Arne Cleveland M.D.   On: 05/06/2013 16:44   Ir Angiogram Selective Each Additional Vessel  05/06/2013   CLINICAL DATA:  Cirrhosis, post TIPS. Multifocal hepatoma. Preop planning for Y 90 radio embolization.  EXAM: ADDITIONAL ARTERIOGRAPHY; SELECTIVE VISCERAL ARTERIOGRAPHY; IR ULTRASOUND GUIDANCE VASC ACCESS RIGHT; IR EMBO TUMOR  ORGAN ISCHEMIA INFARCT INC GUIDE ROADMAPPING  MEDICATIONS: Intravenous Fentanyl and Versed were administered as conscious sedation during continuous cardiorespiratory monitoring by the radiology RN, with a total moderate sedation time of 100 minutes.  CONTRAST:  182mL OMNIPAQUE IOHEXOL 300 MG/ML  SOLN  FLUOROSCOPY TIME:  20 min 24 seconds  PROCEDURE: The procedure, risks (including but not limited to bleeding, infection, organ damage ), benefits, and alternatives were explained to the patient. Questions regarding the procedure were encouraged and answered. The patient understands and consents to the procedure.  Right femoral region prepped and draped in usual sterile fashion. Maximal barrier sterile technique was utilized including caps, mask, sterile gowns, sterile gloves, sterile drape, hand hygiene and skin antiseptic. After the skin was infiltrated with 1% lidocaine, the right common femoral artery was accessed with a 21 gauge micropuncture needle under real-time ultrasound guidance. Needle  exchanged over 018 guidewire for transitional dilator. This allowed passage of a Bentson wire into the abdominal aorta. Over this, a 6 [place] vascular sheath was placed, through which a 5 [place] C2 catheter was advanced and used to selectively catheterize the superior mesenteric artery for selective arteriography. The C2 was then utilized to selectively catheterize the celiac axis for selective arteriography. The C2 catheter was then advanced into the common hepatic artery for selective arteriography. The C2 catheter was then advanced into the proximal gastroduodenal artery. A renegade micro catheter was coaxially advanced and coil embolization of the gastroduodenal artery performed with 5 and 6 mm interlock coils. The renegade micro catheter was then used to selectively catheterize the left hepatic artery for selective arteriography. The renegade micro catheter was withdrawn, and the C2 advanced to selectively catheterize the right hepatic artery for selective arteriography. The C2 was then pulled back into the common hepatic artery for follow-up arteriography of the embolization. After this, this renegade micro catheter was used to selectively catheterize the gastroduodenal artery for additional embolization using a 4 and 5 coils. The renegade micro catheter was then advanced into the Right gastric artery for coil embolization using 3 mm coils. After followup arteriography through the C2 catheter, additional coil embolization of the gastroduodenal artery and right gastric artery performed. After final follow-up arteriography, the renegade micro catheter was advanced into the proper hepatic artery just proximal to its bifurcation for delivery of theTc33mMAA test dose to the hepatic arterial bed. The micro catheter and C2 catheter were withdrawn. After confirmatory femoral arteriography, sheath was removed and hemostasis achieved with the aid of the Exoseal device. The patient tolerated the procedure well.   Complications: None  FINDINGS: Superior mesenteric arteriography shows the vessel to be widely patent, with no significant supply to the hepatic arterial tree.  Celiac arteriography demonstrates widely patent common hepatic and splenic arteries. There is a large gastroduodenal artery supplied by the common hepatic artery. The right gastric artery originates from the proximal left hepatic artery. There is good antegrade flow into the left and right hepatic artery is and intrahepatic branches. Minimal tumor staining is evident. Patency of the TIPS stent not assessed on these arterial injections. The gastroduodenal artery was embolized using multiple coils as above. The right gastric artery was embolized using multiple coils as above. The results of the MAA test bolus to the hepatic arterial tree will be reported separately.  IMPRESSION: 1. Mesenteric anatomy amenable to Y-90 radio embolization, post coil embolization of right gastric and gastroduodenal arteries. 2. Results of Tc43mMAA test injection to the hepatic arterial tree to assess shunt function will be reported  separately.   Electronically Signed   By: Arne Cleveland M.D.   On: 05/06/2013 16:44   Ir Angiogram Selective Each Additional Vessel  05/06/2013   CLINICAL DATA:  Cirrhosis, post TIPS. Multifocal hepatoma. Preop planning for Y 90 radio embolization.  EXAM: ADDITIONAL ARTERIOGRAPHY; SELECTIVE VISCERAL ARTERIOGRAPHY; IR ULTRASOUND GUIDANCE VASC ACCESS RIGHT; IR EMBO TUMOR ORGAN ISCHEMIA INFARCT INC GUIDE ROADMAPPING  MEDICATIONS: Intravenous Fentanyl and Versed were administered as conscious sedation during continuous cardiorespiratory monitoring by the radiology RN, with a total moderate sedation time of 100 minutes.  CONTRAST:  145mL OMNIPAQUE IOHEXOL 300 MG/ML  SOLN  FLUOROSCOPY TIME:  20 min 24 seconds  PROCEDURE: The procedure, risks (including but not limited to bleeding, infection, organ damage ), benefits, and alternatives were explained to  the patient. Questions regarding the procedure were encouraged and answered. The patient understands and consents to the procedure.  Right femoral region prepped and draped in usual sterile fashion. Maximal barrier sterile technique was utilized including caps, mask, sterile gowns, sterile gloves, sterile drape, hand hygiene and skin antiseptic. After the skin was infiltrated with 1% lidocaine, the right common femoral artery was accessed with a 21 gauge micropuncture needle under real-time ultrasound guidance. Needle exchanged over 018 guidewire for transitional dilator. This allowed passage of a Bentson wire into the abdominal aorta. Over this, a 6 [place] vascular sheath was placed, through which a 5 [place] C2 catheter was advanced and used to selectively catheterize the superior mesenteric artery for selective arteriography. The C2 was then utilized to selectively catheterize the celiac axis for selective arteriography. The C2 catheter was then advanced into the common hepatic artery for selective arteriography. The C2 catheter was then advanced into the proximal gastroduodenal artery. A renegade micro catheter was coaxially advanced and coil embolization of the gastroduodenal artery performed with 5 and 6 mm interlock coils. The renegade micro catheter was then used to selectively catheterize the left hepatic artery for selective arteriography. The renegade micro catheter was withdrawn, and the C2 advanced to selectively catheterize the right hepatic artery for selective arteriography. The C2 was then pulled back into the common hepatic artery for follow-up arteriography of the embolization. After this, this renegade micro catheter was used to selectively catheterize the gastroduodenal artery for additional embolization using a 4 and 5 coils. The renegade micro catheter was then advanced into the Right gastric artery for coil embolization using 3 mm coils. After followup arteriography through the C2 catheter,  additional coil embolization of the gastroduodenal artery and right gastric artery performed. After final follow-up arteriography, the renegade micro catheter was advanced into the proper hepatic artery just proximal to its bifurcation for delivery of theTc72mMAA test dose to the hepatic arterial bed. The micro catheter and C2 catheter were withdrawn. After confirmatory femoral arteriography, sheath was removed and hemostasis achieved with the aid of the Exoseal device. The patient tolerated the procedure well.  Complications: None  FINDINGS: Superior mesenteric arteriography shows the vessel to be widely patent, with no significant supply to the hepatic arterial tree.  Celiac arteriography demonstrates widely patent common hepatic and splenic arteries. There is a large gastroduodenal artery supplied by the common hepatic artery. The right gastric artery originates from the proximal left hepatic artery. There is good antegrade flow into the left and right hepatic artery is and intrahepatic branches. Minimal tumor staining is evident. Patency of the TIPS stent not assessed on these arterial injections. The gastroduodenal artery was embolized using multiple coils as above. The right gastric artery  was embolized using multiple coils as above. The results of the MAA test bolus to the hepatic arterial tree will be reported separately.  IMPRESSION: 1. Mesenteric anatomy amenable to Y-90 radio embolization, post coil embolization of right gastric and gastroduodenal arteries. 2. Results of Tc46mMAA test injection to the hepatic arterial tree to assess shunt function will be reported separately.   Electronically Signed   By: Oley Balm M.D.   On: 05/06/2013 16:44   Ir Angiogram Selective Each Additional Vessel  05/06/2013   CLINICAL DATA:  Cirrhosis, post TIPS. Multifocal hepatoma. Preop planning for Y 90 radio embolization.  EXAM: ADDITIONAL ARTERIOGRAPHY; SELECTIVE VISCERAL ARTERIOGRAPHY; IR ULTRASOUND GUIDANCE  VASC ACCESS RIGHT; IR EMBO TUMOR ORGAN ISCHEMIA INFARCT INC GUIDE ROADMAPPING  MEDICATIONS: Intravenous Fentanyl and Versed were administered as conscious sedation during continuous cardiorespiratory monitoring by the radiology RN, with a total moderate sedation time of 100 minutes.  CONTRAST:  OMNIPAQUE IOHEXOL 300 MG/ML  SOLN  FLUOROSCOPY TIME:  20 min 24 seconds  PROCEDURE: The procedure, risks (including but not limited to bleeding, infection, organ damage ), benefits, and alternatives were explained to the patient. Questions regarding the procedure were encouraged and answered. The patient understands and consents to the procedure.  Right femoral region prepped and draped in usual sterile fashion. Maximal barrier sterile technique was utilized including caps, mask, sterile gowns, sterile gloves, sterile drape, hand hygiene and skin antiseptic. After the skin was infiltrated with 1% lidocaine, the right common femoral artery was accessed with a 21 gauge micropuncture needle under real-time ultrasound guidance. Needle exchanged over 018 guidewire for transitional dilator. This allowed passage of a Bentson wire into the abdominal aorta. Over this, a 6 [place] vascular sheath was placed, through which a 5 [place] C2 catheter was advanced and used to selectively catheterize the superior mesenteric artery for selective arteriography. The C2 was then utilized to selectively catheterize the celiac axis for selective arteriography. The C2 catheter was then advanced into the common hepatic artery for selective arteriography. The C2 catheter was then advanced into the proximal gastroduodenal artery. A renegade micro catheter was coaxially advanced and coil embolization of the gastroduodenal artery performed with 5 and 6 mm interlock coils. The renegade micro catheter was then used to selectively catheterize the left hepatic artery for selective arteriography. The renegade micro catheter was withdrawn, and the C2  advanced to selectively catheterize the right hepatic artery for selective arteriography. The C2 was then pulled back into the common hepatic artery for follow-up arteriography of the embolization. After this, this renegade micro catheter was used to selectively catheterize the gastroduodenal artery for additional embolization using a 4 and 5 coils. The renegade micro catheter was then advanced into the Right gastric artery for coil embolization using 3 mm coils. After followup arteriography through the C2 catheter, additional coil embolization of the gastroduodenal artery and right gastric artery performed. After final follow-up arteriography, the renegade micro catheter was advanced into the proper hepatic artery just proximal to its bifurcation for delivery of theTc42mMAA test dose to the hepatic arterial bed. The micro catheter and C2 catheter were withdrawn. After confirmatory femoral arteriography, sheath was removed and hemostasis achieved with the aid of the Exoseal device. The patient tolerated the procedure well.  Complications: None  FINDINGS: Superior mesenteric arteriography shows the vessel to be widely patent, with no significant supply to the hepatic arterial tree.  Celiac arteriography demonstrates widely patent common hepatic and splenic arteries. There is a large gastroduodenal artery supplied by the  common hepatic artery. The right gastric artery originates from the proximal left hepatic artery. There is good antegrade flow into the left and right hepatic artery is and intrahepatic branches. Minimal tumor staining is evident. Patency of the TIPS stent not assessed on these arterial injections. The gastroduodenal artery was embolized using multiple coils as above. The right gastric artery was embolized using multiple coils as above. The results of the MAA test bolus to the hepatic arterial tree will be reported separately.  IMPRESSION: 1. Mesenteric anatomy amenable to Y-90 radio embolization,  post coil embolization of right gastric and gastroduodenal arteries. 2. Results of Tc50mMAA test injection to the hepatic arterial tree to assess shunt function will be reported separately.   Electronically Signed   By: Arne Cleveland M.D.   On: 05/06/2013 16:44   Ir US Guide Vasc Access Right  05/06/2013   CLINICAL DATA:  Cirrhosis, post TIPS. Multifocal hepatoma. Preop planning for Y 90 radio embolization.  EXAM: ADDITIONAL ARTERIOGRAPHY; SELECTIVE VISCERAL ARTERIOGRAPHY; IR ULTRASOUND GUIDANCE VASC ACCESS RIGHT; IR EMBO TUMOR ORGAN ISCHEMIA INFARCT INC GUIDE ROADMAPPING  MEDICATIONS: Intravenous Fentanyl and Versed were administered as conscious sedation during continuous cardiorespiratory monitoring by the radiology RN, with a total moderate sedation time of 100 minutes.  CONTRAST:  119mL OMNIPAQUE IOHEXOL 300 MG/ML  SOLN  FLUOROSCOPY TIME:  20 min 24 seconds  PROCEDURE: The procedure, risks (including but not limited to bleeding, infection, organ damage ), benefits, and alternatives were explained to the patient. Questions regarding the procedure were encouraged and answered. The patient understands and consents to the procedure.  Right femoral region prepped and draped in usual sterile fashion. Maximal barrier sterile technique was utilized including caps, mask, sterile gowns, sterile gloves, sterile drape, hand hygiene and skin antiseptic. After the skin was infiltrated with 1% lidocaine, the right common femoral artery was accessed with a 21 gauge micropuncture needle under real-time ultrasound guidance. Needle exchanged over 018 guidewire for transitional dilator. This allowed passage of a Bentson wire into the abdominal aorta. Over this, a 6 [place] vascular sheath was placed, through which a 5 [place] C2 catheter was advanced and used to selectively catheterize the superior mesenteric artery for selective arteriography. The C2 was then utilized to selectively catheterize the celiac axis for selective  arteriography. The C2 catheter was then advanced into the common hepatic artery for selective arteriography. The C2 catheter was then advanced into the proximal gastroduodenal artery. A renegade micro catheter was coaxially advanced and coil embolization of the gastroduodenal artery performed with 5 and 6 mm interlock coils. The renegade micro catheter was then used to selectively catheterize the left hepatic artery for selective arteriography. The renegade micro catheter was withdrawn, and the C2 advanced to selectively catheterize the right hepatic artery for selective arteriography. The C2 was then pulled back into the common hepatic artery for follow-up arteriography of the embolization. After this, this renegade micro catheter was used to selectively catheterize the gastroduodenal artery for additional embolization using a 4 and 5 coils. The renegade micro catheter was then advanced into the Right gastric artery for coil embolization using 3 mm coils. After followup arteriography through the C2 catheter, additional coil embolization of the gastroduodenal artery and right gastric artery performed. After final follow-up arteriography, the renegade micro catheter was advanced into the proper hepatic artery just proximal to its bifurcation for delivery of theTc72mMAA test dose to the hepatic arterial bed. The micro catheter and C2 catheter were withdrawn. After confirmatory femoral arteriography, sheath was removed  and hemostasis achieved with the aid of the Exoseal device. The patient tolerated the procedure well.  Complications: None  FINDINGS: Superior mesenteric arteriography shows the vessel to be widely patent, with no significant supply to the hepatic arterial tree.  Celiac arteriography demonstrates widely patent common hepatic and splenic arteries. There is a large gastroduodenal artery supplied by the common hepatic artery. The right gastric artery originates from the proximal left hepatic artery. There  is good antegrade flow into the left and right hepatic artery is and intrahepatic branches. Minimal tumor staining is evident. Patency of the TIPS stent not assessed on these arterial injections. The gastroduodenal artery was embolized using multiple coils as above. The right gastric artery was embolized using multiple coils as above. The results of the MAA test bolus to the hepatic arterial tree will be reported separately.  IMPRESSION: 1. Mesenteric anatomy amenable to Y-90 radio embolization, post coil embolization of right gastric and gastroduodenal arteries. 2. Results of Tc39mMAA test injection to the hepatic arterial tree to assess shunt function will be reported separately.   Electronically Signed   By: Arne Cleveland M.D.   On: 05/06/2013 16:44   Dg Chest Port 1 View  (if Code Sepsis Called)  04/29/2013   CLINICAL DATA:  Fever, nausea and vomiting.  EXAM: PORTABLE CHEST - 1 VIEW  COMPARISON:  Chest x-ray 06/21/2012.  FINDINGS: Elevation of the left hemidiaphragm is unchanged. Lung volumes are normal. No consolidative airspace disease. No pleural effusions. Mild cephalization of the pulmonary vasculature, without frank pulmonary edema. Mild cardiomegaly. The patient is rotated to the right On today's exam, resulting in distortion of the mediastinal contours and reduced diagnostic sensitivity and specificity for mediastinal pathology. Atherosclerosis in the thoracic aorta. Status post median sternotomy for CABG.  IMPRESSION: 1. Cardiomegaly with pulmonary venous congestion, but no frank pulmonary edema. 2. Atherosclerosis.   Electronically Signed   By: Vinnie Langton M.D.   On: 04/29/2013 14:03   Ir Embo Tumor Organ Ischemia Infarct Inc Guide Roadmapping  05/06/2013   CLINICAL DATA:  Cirrhosis, post TIPS. Multifocal hepatoma. Preop planning for Y 90 radio embolization.  EXAM: ADDITIONAL ARTERIOGRAPHY; SELECTIVE VISCERAL ARTERIOGRAPHY; IR ULTRASOUND GUIDANCE VASC ACCESS RIGHT; IR EMBO TUMOR ORGAN  ISCHEMIA INFARCT INC GUIDE ROADMAPPING  MEDICATIONS: Intravenous Fentanyl and Versed were administered as conscious sedation during continuous cardiorespiratory monitoring by the radiology RN, with a total moderate sedation time of 100 minutes.  CONTRAST:  1100mL OMNIPAQUE IOHEXOL 300 MG/ML  SOLN  FLUOROSCOPY TIME:  20 min 24 seconds  PROCEDURE: The procedure, risks (including but not limited to bleeding, infection, organ damage ), benefits, and alternatives were explained to the patient. Questions regarding the procedure were encouraged and answered. The patient understands and consents to the procedure.  Right femoral region prepped and draped in usual sterile fashion. Maximal barrier sterile technique was utilized including caps, mask, sterile gowns, sterile gloves, sterile drape, hand hygiene and skin antiseptic. After the skin was infiltrated with 1% lidocaine, the right common femoral artery was accessed with a 21 gauge micropuncture needle under real-time ultrasound guidance. Needle exchanged over 018 guidewire for transitional dilator. This allowed passage of a Bentson wire into the abdominal aorta. Over this, a 6 [place] vascular sheath was placed, through which a 5 [place] C2 catheter was advanced and used to selectively catheterize the superior mesenteric artery for selective arteriography. The C2 was then utilized to selectively catheterize the celiac axis for selective arteriography. The C2 catheter was then advanced into the common  hepatic artery for selective arteriography. The C2 catheter was then advanced into the proximal gastroduodenal artery. A renegade micro catheter was coaxially advanced and coil embolization of the gastroduodenal artery performed with 5 and 6 mm interlock coils. The renegade micro catheter was then used to selectively catheterize the left hepatic artery for selective arteriography. The renegade micro catheter was withdrawn, and the C2 advanced to selectively catheterize the  right hepatic artery for selective arteriography. The C2 was then pulled back into the common hepatic artery for follow-up arteriography of the embolization. After this, this renegade micro catheter was used to selectively catheterize the gastroduodenal artery for additional embolization using a 4 and 5 coils. The renegade micro catheter was then advanced into the Right gastric artery for coil embolization using 3 mm coils. After followup arteriography through the C2 catheter, additional coil embolization of the gastroduodenal artery and right gastric artery performed. After final follow-up arteriography, the renegade micro catheter was advanced into the proper hepatic artery just proximal to its bifurcation for delivery of theTc46mMAA test dose to the hepatic arterial bed. The micro catheter and C2 catheter were withdrawn. After confirmatory femoral arteriography, sheath was removed and hemostasis achieved with the aid of the Exoseal device. The patient tolerated the procedure well.  Complications: None  FINDINGS: Superior mesenteric arteriography shows the vessel to be widely patent, with no significant supply to the hepatic arterial tree.  Celiac arteriography demonstrates widely patent common hepatic and splenic arteries. There is a large gastroduodenal artery supplied by the common hepatic artery. The right gastric artery originates from the proximal left hepatic artery. There is good antegrade flow into the left and right hepatic artery is and intrahepatic branches. Minimal tumor staining is evident. Patency of the TIPS stent not assessed on these arterial injections. The gastroduodenal artery was embolized using multiple coils as above. The right gastric artery was embolized using multiple coils as above. The results of the MAA test bolus to the hepatic arterial tree will be reported separately.  IMPRESSION: 1. Mesenteric anatomy amenable to Y-90 radio embolization, post coil embolization of right gastric and  gastroduodenal arteries. 2. Results of Tc56mMAA test injection to the hepatic arterial tree to assess shunt function will be reported separately.   Electronically Signed   By: Arne Cleveland M.D.   On: 05/06/2013 16:44   Ir Radiologist Eval & Mgmt  04/26/2013   : April 25, 2013  Concha Norway, M.D.  [facility] [address]  [place], [place]  [id number]  RE:  Arrion Galvis (DOB: August 20, 1935)  Dear Dr. Juliann Mule:  Thank you for the referral of your patient Copeland Boise for consultation regarding his newly diagnosed hepatocellular carcinoma. The patient is known to our service since February of 2010, when I saw him urgently for acute upper GI bleed secondary to portal venous hypertension which could not be controlled endoscopically, and necessitated placement of a TIPS. He has done well since the procedure, no further bleeding and mild hepatic encephalopathy controlled by lactulose.  As part of a recent diagnostic work-up for swallowing issues, multiple liver lesions were noted on a CT of 03/21/13. Follow-up MR demonstrated characteristic findings of multifocal hepatocellular carcinoma, corroborated with an elevated AFP tumor maker. He was reviewed at GI Tumor Board, found not to be a candidate for curative hepatic transplantation and referred for consideration of transarterial liver embolization.  His past medical history is significant for diverticulosis, coronary artery disease, atrial fibrillation, aortic stenosis, hypertension, cirrhosis, alcoholism, thrombocytopenia, cellulitis,  diabetes type 2 and chronic low back pain.  Past surgical history is significant for right knee arthroplasty, multiple lumbar surgeries and fusion, appendectomy, coronary bypass grafting, cataract extraction bilateral.  Current medications include acetaminophen, allopurinol, vitamin C, calcium-magnesium-zinc, fentanyl, iron, furosemide, gabapentin, glucose blood, hydroxyzine, insulin aspart, isosorbide mononitrate, lactulose, loratadine,  metoprolol tartrate, nitroglycerin, omeprazole, penicillin v potassium, polyethylene glycol, prednisone, rifaximin, spironolactone, vitamin B1 and tizanidine.  He has allergy to shellfish, amoxicillin, aspirin, amoxicillin-pot clavulanate, buprenorphine Hcl-naloxone Hcl, codeine, ezetimibe, hydromorphone Hcl, meperidine Hcl, morphine, morphine sulfate, oxycodone and sulfonamide derivatives.  I spent the majority of the 45 minute consultation discussing the pathophysiology of hepatocellular carcinoma and treatment options. Unfortunately, he is not a transplant candidate which would be the only curative option. The multifocal nature of his disease precludes percutaneous ablation options.  He is an appropriate candidate for catheter directed therapies for palliation. We discussed transarterial chemoembolization, bland embolization and Y-90 radioembolization. We discussed the technique, anticipated benefits, possible risks and side effects and possible complications of the different approaches. In general, these have all been shown to have similar efficacy in the setting of hepatoma. However, considering his ECOG status I recommend consideration primarily of Y-90 radioembolization to start with. This avoids the systemic toxicity of transarterial chemoembolization. Concurrent p.o. or IV chemotherapy regimen could be pursued, if deemed appropriate. To complete his work-up, we would need to do an arterial mapping study as well as assess shunt fraction to minimize nontarget radioembolization risk. He and his family seemed to understand and were anxious to proceed. Accordingly, we can set up outpatient hepatic arteriography and shunt fraction calculation in contemplation of transarterial Y-90 radioembolization of his multifocal hepatocellular carcinoma. We discussed that this would likely have no effect on the TIPS shunt. The hypervascular nature of hepatoma is advantageous to minimize the radiation dose to his underlying  liver parenchyma. I do not anticipate a significant effect on his diabetes which has been recently more difficult to control.  I appreciate your referral of Mr. Mancebo for consideration of transarterial embolization of his hepatocellular carcinoma and we will proceed with the mapping as above. I will coordinate with you the necessary surveillance imaging and testing post procedure.  Sincerely,  D. Arne Cleveland, M.D.  Jonathon ResidesSilvano Rusk, M.D.  [name], M.D.  Renato Shin, M.D.   Electronically Signed   By: Arne Cleveland M.D.   On: 04/26/2013 09:07   Nm Fusion  05/06/2013   EXAM: NUCLEAR MEDICINE LIVER SCAN with SPECT  TECHNIQUE: Abdominal images were obtained in multiple projections after intravenous injection of radiopharmaceutical. Post processing fusion with CT performed.  RADIOPHARMACEUTICALS:  3.3MILLI CURIE MAA TECHNETIUM TO 73M ALBUMIN AGGREGATED  COMPARISON:  IR ANGIO/VISCERAL dated 05/06/2013; CT ABDOMEN W/O CM dated 03/21/2013  FINDINGS: Intra-arterial injected MAA localizes to the liver. There is a curvilinear focus of activity extending anterior to the right hepatic lobe which may represent a vascular structure. No evidence of bowel or stomach uptake.  Lung shunt fraction equals 3.2%.  IMPRESSION: 1. Curvilinear activity anterior to the right hepatic lobe may represent a vascular structure such as a falciform ligament artery. 2. Lung shunt fraction equals 3.2%. Findings conveyed toDAYNE HASSELL III on 05/06/2013  at17:52.   Electronically Signed   By: Suzy Bouchard M.D.   On: 05/06/2013 17:54    ASSESSMENT: Kemon R Ihrig 78 y.o. male with a history of Cellulitis of right lower extremity  CIRRHOSIS, ALCOHOLIC  Atrial fibrillation  ANEMIA, IRON DEFICIENCY   ASCITES  Hepatocellular carcinoma - Plan: CBC with Differential, Protime-INR, Comprehensive metabolic panel (Cmet) - [facility], AFP tumor marker, CT Chest W Contrast   PLAN:   1. HCC, newly diagnosed based on imaging  (cT3N0M0) --We reviewed his imaging and labs consistent with primary [place]. I agree by [facility] staging, Mr. Ehrman has Intermediate (Stage B) [place] and may be a candidate for liver directed transarterial chemo or radioembolization depending on his ECOG status. He has sufficient hepatic reserve with a MELD of 13 and Br of 1.5 as calculated on labs from 1 month ago.  --As reported by Dr. Carlean Purl, his case was discussed at the GI conference and the consensus was that there is no need for biopsy of the masses, they are [facility] and given numerous lesions (probably more than 3 on more) that he is not a transplant candidate.  --He underwent Y-90 radioembolization on 05/06/2013 and repeated on 05/29/2013 as noted above.  His liver function is stable.   He will see Dr. Vernard Gambles next month with an MRI of abdomen prior.   2. Recurrent RLE cellulitis, improving.  --He is afebrile. He does not have a leukocytosis. He notes improvement clinically. I reminded him of a low threshold to call for antibiotics.   3. C. Difficile, resolved.  --He was discharged on vancomycin 125 mg qid and will completed a 14-day course.  He reports resolution of his diarrhea.   4. Anemia/Thrombocytopenia.  --Plts low secondary #2 and he is without signs of bleeding presently. Hemoglobin 11.7 and plts of 68K. Transfuse for bleeding.   5. Follow-up.  --We will have his follow in 4 weeks with a CT of chest to exclude mets one week prior and labs.   All questions were answered. The patient knows to call the clinic with any problems, questions or concerns. We can certainly see the patient much sooner if necessary.  I spent 15 minutes counseling the patient face to face. The total time spent in the appointment was 25 minutes.    Concha Norway, MD 06/05/2013 3:55 PM

## 2013-06-06 ENCOUNTER — Ambulatory Visit (INDEPENDENT_AMBULATORY_CARE_PROVIDER_SITE_OTHER): Payer: Medicare Other | Admitting: Podiatrist

## 2013-06-06 VITALS — Resp 16 | Ht 75.0 in | Wt 244.0 lb

## 2013-06-06 DIAGNOSIS — M79609 Pain in unspecified limb: Secondary | ICD-10-CM

## 2013-06-06 DIAGNOSIS — B351 Tinea unguium: Secondary | ICD-10-CM

## 2013-06-06 DIAGNOSIS — E1159 Type 2 diabetes mellitus with other circulatory complications: Secondary | ICD-10-CM

## 2013-06-06 DIAGNOSIS — E1151 Type 2 diabetes mellitus with diabetic peripheral angiopathy without gangrene: Secondary | ICD-10-CM

## 2013-06-06 NOTE — Progress Notes (Signed)
HPI: Patient presents today for follow up of foot and nail care. He has baseline peripheral edema he's been using a pump and in the past he has had ulcers on bilateral heels requiring skin grafts which was performed at the wound center in [place]. Overall the swelling is much improved from using the pump but he does still have edema bilaterally and has had multiple bouts with cellulitis requiring hospitalization. She relates that bilateral heel pain is improved from the last visit. He also states he just got diagnosed with liver cancer and is undergoing radiation therapy.   Objective: Patients chart is reviewed. Neurovascular status unchanged pedal pulses are palpable in lower extremity edema is present.. Patients nails are thickened, discolored, distrophic, friable and brittle with yellow-brown discoloration. Patient subjectively relates they are painful with shoes and with ambulation of bilateral feet.   Assessment: Symptomatic onychomycosis,  diabetes with peripheral angiopathy   Plan: Discussed treatment options and alternatives. The symptomatic toenails were debrided through manual an mechanical means without complication. Return appointment recommended at routine intervals of 3 months

## 2013-06-09 ENCOUNTER — Other Ambulatory Visit: Payer: Self-pay | Admitting: Internal Medicine

## 2013-06-19 LAB — COMPREHENSIVE METABOLIC PANEL
ALK PHOS: 97 U/L (ref 39–117)
ALT: 28 U/L (ref 0–53)
AST: 53 U/L — ABNORMAL HIGH (ref 0–37)
Albumin: 3.5 g/dL (ref 3.5–5.2)
BUN: 17 mg/dL (ref 6–23)
CO2: 29 mEq/L (ref 19–32)
CREATININE: 1.08 mg/dL (ref 0.50–1.35)
Calcium: 9 mg/dL (ref 8.4–10.5)
Chloride: 102 mEq/L (ref 96–112)
Glucose, Bld: 128 mg/dL — ABNORMAL HIGH (ref 70–99)
Potassium: 3.8 mEq/L (ref 3.5–5.3)
Sodium: 141 mEq/L (ref 135–145)
TOTAL PROTEIN: 6.2 g/dL (ref 6.0–8.3)
Total Bilirubin: 1.6 mg/dL — ABNORMAL HIGH (ref 0.2–1.2)

## 2013-06-26 ENCOUNTER — Ambulatory Visit
Admission: RE | Admit: 2013-06-26 | Discharge: 2013-06-26 | Disposition: A | Discharge status: Home / Self Care | Payer: Medicare Other | Source: Ambulatory Visit | Attending: Radiology | Admitting: Radiology

## 2013-06-26 DIAGNOSIS — C220 Liver cell carcinoma: Secondary | ICD-10-CM

## 2013-06-26 NOTE — Progress Notes (Signed)
Patient ID: [name], male   DOB: [birth year], 78 y.o.   MRN: [id number] Established patient office  visit  Chief complaint Hepatoma  HPI Patient seem for scheduled 38mo follow up.  He had  Y-90 radioembolization  on Wednesday, 04/08.   He reports some minor pain that was relieved with pain medications.  He had elevated glucoses as well and saw endocrinologist Dr. Loanne Drilling on 4/13 with an increase in novolog and stopped his long-acting.  His glucoses are leveling off reported by his wife.    As noted previously, he had labs collected on 04/05/2013 demonstrating an AFP-tumor marker of 44.8 up from 2.5 nearly one year ago. His CBC on 04/17/2013 revealed a WBC of 4.3; hemoglobin of 11.6; HCT of 34.5 and platelets of 48. His INR was 1.36. He had a MR of the liver with and without contrast on 04/03/2013 which showed cirrhosis with multiple anterior segment right liver lobe lesions, most consistent with multifocal HCC. There was also portal venous hypertension with chronic non-occulusive wall thrombus in the splenic venin and splenoportal confluence. His TIPS shunt catheter was patent.  He was referred for US biopsy of liver lesion by Dr. Laurence Ferrari of Interventional radiology and based on MRI he has a total of 4 (> 1cm) lesions that all demonstrate arterial enhancement and delayed washout which is diagnostic of multifocal HCC by both AASLD and UNOS/OPTN criteria. A biopsy was not necessary. He was also seen by Dr. Loanne Drilling on 04/01/2013 for management of his insulin-requrining diabetes. He was was seen by Dr. Carlean Purl of Gastroenterology on 03/20/2013. Dr. Godfrey Pick had dilated his GE junction stenosis stricure area up to 18 mm in December, 2014 with the patient reporting improvement in his swallowing.  Patient states he had "a little" post-procedure pain and nausea but that it's resolved now.  Wife states his appetite is poor; that she's lucky to get two meals in him each day.  No other complaints/observations.      Review of Systems  Constitutional: Negative for fever and weight loss.  Gastrointestinal: Negative for nausea, vomiting, abdominal pain, diarrhea and constipation.   Physical Exam  Constitutional: He is oriented to person, place, and time. No distress.  HENT:  Head: Normocephalic.  Eyes: EOM are normal.  Neck: Neck supple.  Pulmonary/Chest: Effort normal. No stridor. No respiratory distress.  Abdominal: Soft. He exhibits no distension. There is no tenderness.  Neurological: He is alert and oriented to person, place, and time.  Skin: Skin is warm and dry.  Psychiatric: Mood and affect normal.   Filed Vitals:   06/26/13 0927  BP: 142/63  Pulse: 60  Temp: 98.1 F (36.7 C)  Resp: 22     Assessment and Plan: Doing well one month post Y90 radioembo for multifocal hepatoma. Repeat MR Liver with contrast in 4-6 weeks. Get f/u AFP. Will f/u with him post MRI.

## 2013-06-26 NOTE — Progress Notes (Signed)
Patient states he had "a little" post-procedure pain and nausea but that it's resolved now.  Wife states his appetite is poor; that she's lucky to get two meals in him each day.  No other complaints/observations.  jkl

## 2013-06-28 ENCOUNTER — Other Ambulatory Visit (HOSPITAL_BASED_OUTPATIENT_CLINIC_OR_DEPARTMENT_OTHER): Payer: Medicare Other

## 2013-06-28 ENCOUNTER — Encounter (HOSPITAL_COMMUNITY): Payer: Self-pay

## 2013-06-28 ENCOUNTER — Ambulatory Visit (HOSPITAL_COMMUNITY)
Admission: RE | Admit: 2013-06-28 | Discharge: 2013-06-28 | Disposition: A | Discharge status: Home / Self Care | Payer: Medicare Other | Source: Ambulatory Visit | Attending: Internal Medicine | Admitting: Internal Medicine

## 2013-06-28 DIAGNOSIS — C228 Malignant neoplasm of liver, primary, unspecified as to type: Secondary | ICD-10-CM | POA: Insufficient documentation

## 2013-06-28 DIAGNOSIS — C220 Liver cell carcinoma: Secondary | ICD-10-CM

## 2013-06-28 DIAGNOSIS — D696 Thrombocytopenia, unspecified: Secondary | ICD-10-CM

## 2013-06-28 DIAGNOSIS — D509 Iron deficiency anemia, unspecified: Secondary | ICD-10-CM

## 2013-06-28 DIAGNOSIS — I4891 Unspecified atrial fibrillation: Secondary | ICD-10-CM

## 2013-06-28 LAB — COMPREHENSIVE METABOLIC PANEL (CC13)
ALBUMIN: 3.1 g/dL — AB (ref 3.5–5.0)
ALK PHOS: 118 U/L (ref 40–150)
ALT: 26 U/L (ref 0–55)
AST: 50 U/L — ABNORMAL HIGH (ref 5–34)
Anion Gap: 8 mEq/L (ref 3–11)
BILIRUBIN TOTAL: 1.46 mg/dL — AB (ref 0.20–1.20)
BUN: 19.3 mg/dL (ref 7.0–26.0)
CO2: 27 mEq/L (ref 22–29)
Calcium: 9.2 mg/dL (ref 8.4–10.4)
Chloride: 104 mEq/L (ref 98–109)
Creatinine: 1.1 mg/dL (ref 0.7–1.3)
Glucose: 176 mg/dl — ABNORMAL HIGH (ref 70–140)
POTASSIUM: 3.9 meq/L (ref 3.5–5.1)
Sodium: 140 mEq/L (ref 136–145)
TOTAL PROTEIN: 5.9 g/dL — AB (ref 6.4–8.3)

## 2013-06-28 LAB — CBC WITH DIFFERENTIAL/PLATELET
BASO%: 0.7 % (ref 0.0–2.0)
Basophils Absolute: 0 10*3/uL (ref 0.0–0.1)
EOS%: 0 % (ref 0.0–7.0)
Eosinophils Absolute: 0 10*3/uL (ref 0.0–0.5)
HCT: 33.9 % — ABNORMAL LOW (ref 38.4–49.9)
HEMOGLOBIN: 11.2 g/dL — AB (ref 13.0–17.1)
LYMPH#: 0.5 10*3/uL — AB (ref 0.9–3.3)
LYMPH%: 15.9 % (ref 14.0–49.0)
MCH: 32.2 pg (ref 27.2–33.4)
MCHC: 33 g/dL (ref 32.0–36.0)
MCV: 97.4 fL (ref 79.3–98.0)
MONO#: 0.3 10*3/uL (ref 0.1–0.9)
MONO%: 10.6 % (ref 0.0–14.0)
NEUT#: 2.2 10*3/uL (ref 1.5–6.5)
NEUT%: 72.8 % (ref 39.0–75.0)
PLATELETS: 42 10*3/uL — AB (ref 140–400)
RBC: 3.48 10*6/uL — ABNORMAL LOW (ref 4.20–5.82)
RDW: 16.5 % — ABNORMAL HIGH (ref 11.0–14.6)
WBC: 3.1 10*3/uL — AB (ref 4.0–10.3)

## 2013-06-28 LAB — PROTIME-INR
INR: 1.2 — ABNORMAL LOW (ref 2.00–3.50)
Protime: 14.4 Seconds — ABNORMAL HIGH (ref 10.6–13.4)

## 2013-06-28 MED ORDER — IOHEXOL 300 MG/ML  SOLN
80.0000 mL | Freq: Once | INTRAMUSCULAR | Status: AC | PRN
  Administered 2013-06-28: 80 mL via INTRAVENOUS

## 2013-06-29 LAB — AFP TUMOR MARKER: AFP-Tumor Marker: 12 ng/mL — ABNORMAL HIGH (ref 0.0–8.0)

## 2013-07-01 ENCOUNTER — Other Ambulatory Visit: Payer: Self-pay | Admitting: Infectious Diseases

## 2013-07-01 ENCOUNTER — Other Ambulatory Visit: Payer: Self-pay | Admitting: Cardiology

## 2013-07-04 ENCOUNTER — Other Ambulatory Visit (HOSPITAL_COMMUNITY): Payer: Self-pay | Admitting: Interventional Radiology

## 2013-07-04 DIAGNOSIS — Z95828 Presence of other vascular implants and grafts: Secondary | ICD-10-CM

## 2013-07-05 ENCOUNTER — Ambulatory Visit (HOSPITAL_BASED_OUTPATIENT_CLINIC_OR_DEPARTMENT_OTHER): Payer: Medicare Other | Admitting: Internal Medicine

## 2013-07-05 ENCOUNTER — Telehealth: Payer: Self-pay | Admitting: Internal Medicine

## 2013-07-05 VITALS — BP 156/51 | HR 60 | Temp 98.4°F | Resp 18 | Ht 75.0 in | Wt 254.4 lb

## 2013-07-05 DIAGNOSIS — D61818 Other pancytopenia: Secondary | ICD-10-CM

## 2013-07-05 DIAGNOSIS — C228 Malignant neoplasm of liver, primary, unspecified as to type: Secondary | ICD-10-CM

## 2013-07-05 DIAGNOSIS — I2510 Atherosclerotic heart disease of native coronary artery without angina pectoris: Secondary | ICD-10-CM

## 2013-07-05 DIAGNOSIS — C220 Liver cell carcinoma: Secondary | ICD-10-CM

## 2013-07-05 NOTE — Progress Notes (Signed)
[facility] OFFICE PROGRESS NOTE  Unice Cobble, MD [phone] [address]  DIAGNOSIS: HCC (hepatocellular carcinoma) - Plan: CBC with Differential, Comprehensive metabolic panel (Cmet) - [facility], AFP tumor marker  Chief Complaint  Patient presents with  . HCC (hepatocellular carcinoma)    CURRENT TREATMENT:  Y-90 radioembolization by Dr. Arne Cleveland.  INTERVAL HISTORY: [name] 78 y.o. male with a history of cirrhosis complicated by encephalopathy s/p TIPS is here for follow-up.   He was last seen by me on 06/07/2013.    Today, he is accompanied by his wife [name]. He complains of occasional cramping. He had interim Ct of chest with was negative for metastatic disease.  He denies recent hospitalization.  He had a fall landing on her right side with some residual tenderness in upper quadrant.  This occurred while doing yard work. He reports increased activity.   He ambulates with the assistance of a cane.  RECENT ONCOLOGY HISTORY: He underwent Y-90 radioembolization by Dr. Vernard Gambles on 1/66/0630 without complications. He also was discharged on Sunday (04/29/2013- 05/05/2013) for acute on chronic cellulitis of the right leg and C. Difficile colitis with fevers. He responded with broad spectrum antibiotics (vancomycin and zosyn) which was narrowed on discharge to doxycycline.  R Lower extremity duplex was negative for DVT. He was discharged with oral vancomycin to finish a 14-day course for his C. Difficile. He was readmitted on Easter Sunday, 04/05 and discharge on 04/09 for recurrent cellulitis of R L Extremity.  He was instructed to take the penicillin that he has been on previously.  He had another Y-90 radioembolization by Dr. Vernard Gambles on on Wednesday, 04/08.   He reports some minor pain that was relieved with pain medications.  He had elevated glucoses as well and saw endocrinologist Dr. Loanne Drilling on 4/13 with an increase in novolog and stopped his long-acting.  His  glucoses are leveling off reported by his wife.    As noted previously, he had labs collected on 04/05/2013 demonstrating an AFP-tumor marker of 44.8 up from 2.5 nearly one year ago. His CBC on 04/17/2013 revealed a WBC of 4.3; hemoglobin of 11.6; HCT of 34.5 and platelets of 48. His INR was 1.36. He had a MR of the liver with and without contrast on 04/03/2013 which showed cirrhosis with multiple anterior segment right liver lobe lesions, most consistent with multifocal HCC. There was also portal venous hypertension with chronic non-occulusive wall thrombus in the splenic venin and splenoportal confluence. His TIPS shunt catheter was patent.  He was referred for US biopsy of liver lesion by Dr. Laurence Ferrari of Interventional radiology and based on MRI he has a total of 4 (> 1cm) lesions that all demonstrate arterial enhancement and delayed washout which is diagnostic of multifocal HCC by both AASLD and UNOS/OPTN criteria. A biopsy was not necessary. He was also seen by Dr. Loanne Drilling on 04/01/2013 for management of his insulin-requrining diabetes. He was was seen by Dr. Carlean Purl of Gastroenterology on 03/20/2013. Dr. Godfrey Pick had dilated his GE junction stenosis stricure area up to 18 mm in December, 2014 with the patient reporting improvement in his swallowing.    MEDICAL HISTORY: Past Medical History  Diagnosis Date  . Diverticulosis   . Gastric antral vascular ectasia   . Rhinophyma   . CAD (coronary artery disease)     myoview 2007 no ischemia/ no ASA because of GI disease  . Atrial fibrillation     amiodarone in past, but problems  and left off meds.  . Aortic stenosis     echo, September, 2010, mild aortic insufficiency  . Visual loss, one eye     right eye-ophthalmic arterial branch occlusion  . Internal hemorrhoids   . GERD (gastroesophageal reflux disease)   . Hypertension   . Allergic rhinitis   . Anemia     multifactorial  . Cirrhosis, alcoholic   . Thrombocytopenia   . Bleeding  esophageal varices   . Encephalopathy, unspecified   . Gout   . Short-segment Barrett's esophagus     suspected  . Venous insufficiency   . Ejection fraction     EF 55%, echo, September, 2010  . Carotid bruit     Dopplers okay in the past  . Cellulitis     recurrent to RLE/notes 06/21/2012  . CHF (congestive heart failure)   . Sinus bradycardia     January, 2014  . Diastolic CHF     January, 2014  . Complication of anesthesia     "quit breathing once a long time ago; dropped my BP too; it was after I was given Dilaudid" (06/21/2012)  . Heart murmur   . Anginal pain   . Shortness of breath     "stay that way most of the time now" (06/21/2012)  . OSA on CPAP   . Diabetes mellitus, type 2   . History of blood transfusion     "not related to OR" (06/21/2012)  . H/O hiatal hernia   . Osteoarthritis     "ALL OVER" (06/21/2012)  . Chronic lower back pain   . Aspiration into airway-silent on modified barium swallow 03/20/2013  . Melanoma     resected by [facility] 2008  . Skin cancer     Basal cell and squamous cell    INTERIM HISTORY: has HYPERLIPIDEMIA; GOUT NOS; ANEMIA, IRON DEFICIENCY; THROMBOCYTOPENIA, CHRONIC; ARTERIAL BRANCH OCCLUSION OF RETINA; HYPERTENSION, ESSENTIAL NOS; ALLERGIC RHINITIS; GERD; CIRRHOSIS, ALCOHOLIC; Hepatic encephalopathy; HYPERPLASIA, PRST NOS W/O URINARY OBST/LUTS; OSTEOARTHRITIS; LOW BACK PAIN, CHRONIC; SLEEP APNEA;  ASCITES; HYPERURICEMIA; COLONIC POLYPS, ADENOMATOUS, HX OF; CORONARY ARTERY BYPASS GRAFT, HX OF; NEUROPATHY; Paresthesia of foot; Ejection fraction; Carotid bruit; CAD (coronary artery disease); Atrial fibrillation; Unspecified adverse effect of unspecified drug, medicinal and biological substance; DM2 (diabetes mellitus, type 2); Edema; Sinus bradycardia; Aortic stenosis; Diastolic CHF; Congestive heart failure; Cellulitis of right lower extremity; DM (diabetes mellitus) with peripheral vascular complication; Lymphedema of lower extremity; Venous stasis of  lower extremity; Open wound of knee, leg (except thigh), and ankle, complicated; Dysphagia, [id number](450.38); Left arm pain; Aspiration into airway-silent on modified barium swallow; Leg ulcer, left; HCC (hepatocellular carcinoma); Malnutrition of moderate degree; Enteritis due to Clostridium difficile; Hepatocellular carcinoma; and Cellulitis on his problem list.    ALLERGIES:  is allergic to shellfish-derived products; doxycycline hyclate; amoxicillin-pot clavulanate; aspirin; buprenorphine hcl-naloxone hcl; codeine; ezetimibe; hydromorphone hcl; meperidine hcl; morphine; morphine sulfate; oxycodone; and sulfonamide derivatives.  MEDICATIONS: has a current medication list which includes the following prescription(s): acetaminophen, allopurinol, vitamin c, calcium-magnesium-zinc, fentanyl, iron, furosemide, gabapentin, glucose blood, hydrocodone-acetaminophen, insulin aspart, insulin syringe .5cc/31gx5/16", isosorbide mononitrate, isosorbide mononitrate, lactulose, loratadine, metoprolol tartrate, nitroglycerin, omeprazole, ondansetron, penicillin v potassium, penicillin v potassium, polyethylene glycol, prednisone, rifaximin, saccharomyces boulardii, spironolactone, and vitamin b-1.  SURGICAL HISTORY:  Past Surgical History  Procedure Laterality Date  . Total knee arthroplasty Right   . Esophagogastroduodenoscopy  04/10/2008    esophageal varices, cardia varix  . Tips procedure  2/10  . Back surgery  X5 "for ruptured disc; last time did a fusion" (06/21/2012)  . Excisional hemorrhoidectomy    . Release scar contracture / graft repairs of hand      bilaterally   . Elbow surgery Right   . Hiatal hernia repair    . Colonoscopy w/ polypectomy  02/14/2007    adenomatous polyps, diverticulosis, internal hemorrhoids/rectal varices  . Cataract extraction, bilateral  January 2013  . Tonsillectomy    . Appendectomy    . Joint replacement    . Coronary artery bypass graft  1993    6 Bypass  .  Cataract extraction w/ intraocular lens  implant, bilateral      REVIEW OF SYSTEMS:   Constitutional: Denies fevers, chills or abnormal weight loss Eyes: Denies blurriness of vision Ears, nose, mouth, throat, and face: Denies mucositis or sore throat Respiratory: Denies cough, dyspnea or wheezes Cardiovascular: Denies palpitation, chest discomfort or lower extremity swelling Gastrointestinal:  Denies nausea, heartburn or change in bowel habits Skin: Denies abnormal skin rashes Lymphatics: Denies new lymphadenopathy or easy bruising Neurological:Denies numbness, tingling or new weaknesses Behavioral/Psych: Mood is stable, no new changes  All other systems were reviewed with the patient and are negative.  PHYSICAL EXAMINATION: ECOG PERFORMANCE STATUS: 2 - Symptomatic, <50% confined to bed  Blood pressure 156/51, pulse 60, temperature 98.4 F (36.9 C), temperature source Oral, resp. rate 18, height 6\' 3"  (1.905 m), weight 254 lb 6.4 oz (115.395 kg), SpO2 99.00%.  GENERAL:alert, no distress and comfortable; Ambulates to the bench cautiously, independently  SKIN: skin color, texture, turgor are normal, no rashes or significant lesions; + gynecomastia EYES: normal, Conjunctiva are pink and non-injected, sclera clear  EARS: TM clear bilaterally without evidence of effusion.  OROPHARYNX:no exudate, no erythema and lips, buccal mucosa, and tongue normal  NECK: supple, thyroid normal size, non-tender, without nodularity  LYMPH: no palpable lymphadenopathy in the cervical, axillary or inguinal  LUNGS: clear to auscultation and percussion with normal breathing effort  HEART: regular rate & rhythm and no murmurs and 1 + extremity edema  ABDOMEN:abdomen soft, non-tender and normal bowel sounds; distended with mild fluid; No additional stigmata of liver disease, i.e., caput medusa  Musculoskeletal:no cyanosis of digits and no clubbing  NEURO: alert & oriented x 3 with fluent speech, no focal  motor/sensory deficits; No asterix   Labs:  Lab Results  Component Value Date   WBC 3.1* 06/28/2013   HGB 11.2* 06/28/2013   HCT 33.9* 06/28/2013   MCV 97.4 06/28/2013   PLT 42* 06/28/2013   NEUTROABS 2.2 06/28/2013      Chemistry      Component Value Date/Time   NA 140 06/28/2013 1025   NA 141 06/19/2013 1254   K 3.9 06/28/2013 1025   K 3.8 06/19/2013 1254   CL 102 06/19/2013 1254   CO2 27 06/28/2013 1025   CO2 29 06/19/2013 1254   BUN 19.3 06/28/2013 1025   BUN 17 06/19/2013 1254   CREATININE 1.1 06/28/2013 1025   CREATININE 1.08 06/19/2013 1254   CREATININE 1.24 05/30/2013 0330      Component Value Date/Time   CALCIUM 9.2 06/28/2013 1025   CALCIUM 9.0 06/19/2013 1254   ALKPHOS 118 06/28/2013 1025   ALKPHOS 97 06/19/2013 1254   AST 50* 06/28/2013 1025   AST 53* 06/19/2013 1254   ALT 26 06/28/2013 1025   ALT 28 06/19/2013 1254   BILITOT 1.46* 06/28/2013 1025   BILITOT 1.6* 06/19/2013 1254      Studies:  No  results found.   RADIOGRAPHIC STUDIES: 06/28/2013  (Personally reviewed by me) CT CHEST WITH CONTRAST TECHNIQUE: Multidetector CT imaging of the chest was performed during intravenous contrast administration. CONTRAST: 19mL OMNIPAQUE IOHEXOL 300 MG/ML SOLN COMPARISON: Chest CT 05/25/2012. Abdominal CT 03/21/2013. Abdominal MRI 04/03/2013. Chest CT 06/22/2011 FINDINGS: Lungs/Pleura: 3 mm right upper lobe lung nodule on image 19 was  likely present on image 36 of the 06/22/2011 exam. Biapical calcified granulomas. Lingular 3 mm nodule on image 35/series 5 is unchanged since 2013. No pleural fluid. Heart/Mediastinum: No supraclavicular adenopathy. Bilateral gynecomastia. Median sternotomy with aortic atherosclerosis. Mild cardiomegaly, without pericardial effusion. No central pulmonary embolism, on this non-dedicated study. No mediastinal or hilar adenopathy. Debris within the thoracic esophagus on image 17. Upper Abdomen: TIPS shunt catheter. Poor parenchymal opacification with contrast, including within the  liver. Probable decrease in  hepatocellular carcinoma burden. 1.9 cm hepatic dome lesion on image 52 is at the site of a 3.6 cm lesion on 03/21/2013. Cirrhosis. Splenomegaly. Underdistended proximal stomach. Portal vein  enlargement. Redemonstration of nonocclusive thrombus within the splenoportal confluence. Suboptimally evaluated. Cholelithiasis. Normal imaged adrenal glands and kidneys. Bones/Musculoskeletal: Advanced lower thoracic degenerative disc disease. Diffuse idiopathic skeletal hyperostosis. IMPRESSION: 1. No acute process or evidence of metastatic disease in the chest.  Tiny bilateral pulmonary nodules which are unchanged back to 2013. 2. Since 03/21/2013, decreased hepatocellular carcinoma burden. Suboptimally evaluated due to limitations as detailed above.  3. Cirrhosis with portal vein enlargement and grossly similar nonocclusive thrombus in the splenoportal confluence. 4. Esophageal air fluid level suggests dysmotility or gastroesophageal reflux.    ASSESSMENT: Leiam R Millis 78 y.o. male with a history of HCC (hepatocellular carcinoma) - Plan: CBC with Differential, Comprehensive metabolic panel (Cmet) - [facility], AFP tumor marker   PLAN:   1. HCC, cT3N0M0 --We reviewed his imaging and labs consistent with primary [place]. I agree by [facility] staging, Mr. Ryker has Intermediate (Stage B) [place] and may be a candidate for liver directed transarterial chemo or radioembolization depending on his ECOG status. He has sufficient hepatic reserve with a MELD of 13 and Br of 1.5 as calculated on labs from 1 month ago.  --As reported by Dr. Carlean Purl, his case was discussed at the GI conference and the consensus was that there is no need for biopsy of the masses, they are [facility] and given numerous lesions (probably more than 3 on more) that he is not a transplant candidate.  --He underwent Y-90 radioembolization on 05/06/2013 and repeated on 05/29/2013.  His liver function is stable with a mild elevated AST.   He  will see Dr. Vernard Gambles next month with an MRI of abdomen prior. His AFP is decreasing. CT of chest is without evidence of metastases.   2. Pancytopenia.  --Plts low secondary #1 and he is without signs of bleeding presently. Hemoglobin 11.2 and plts of 42K. Transfuse for bleeding.   3. Follow-up.  --We will have his follow in 8 weeks with labs including an AFP and chemistries and CBC.   All questions were answered. The patient knows to call the clinic with any problems, questions or concerns. We can certainly see the patient much sooner if necessary.  I spent 15 minutes counseling the patient face to face. The total time spent in the appointment was 25 minutes.    Concha Norway, MD 07/05/2013 2:47 PM

## 2013-07-05 NOTE — Telephone Encounter (Signed)
gv pt appt schedule for july °

## 2013-07-10 ENCOUNTER — Ambulatory Visit (INDEPENDENT_AMBULATORY_CARE_PROVIDER_SITE_OTHER): Payer: Medicare Other | Admitting: Internal Medicine

## 2013-07-10 ENCOUNTER — Encounter: Payer: Self-pay | Admitting: Internal Medicine

## 2013-07-10 VITALS — BP 136/50 | HR 68 | Ht 71.5 in | Wt 250.1 lb

## 2013-07-10 DIAGNOSIS — C228 Malignant neoplasm of liver, primary, unspecified as to type: Secondary | ICD-10-CM

## 2013-07-10 DIAGNOSIS — K224 Dyskinesia of esophagus: Secondary | ICD-10-CM

## 2013-07-10 DIAGNOSIS — K7030 Alcoholic cirrhosis of liver without ascites: Secondary | ICD-10-CM

## 2013-07-10 DIAGNOSIS — R252 Cramp and spasm: Secondary | ICD-10-CM | POA: Insufficient documentation

## 2013-07-10 DIAGNOSIS — I2510 Atherosclerotic heart disease of native coronary artery without angina pectoris: Secondary | ICD-10-CM

## 2013-07-10 DIAGNOSIS — C220 Liver cell carcinoma: Secondary | ICD-10-CM

## 2013-07-10 MED ORDER — ZOSTER VACCINE LIVE 19400 UNT/0.65ML ~~LOC~~ SOLR: 0.6500 mL | Freq: Once | SUBCUTANEOUS | Status: DC

## 2013-07-10 MED ORDER — TAURINE 1000 MG PO CAPS: 3.0000 | ORAL_CAPSULE | Freq: Every day | ORAL | Status: DC

## 2013-07-10 MED ORDER — HYOSCYAMINE SULFATE 0.125 MG SL SUBL: 0.1250 mg | SUBLINGUAL_TABLET | SUBLINGUAL | Status: DC | PRN

## 2013-07-10 NOTE — Assessment & Plan Note (Signed)
Trial of Taurine 3 g daily for this which is associated with cirrhosis

## 2013-07-10 NOTE — Assessment & Plan Note (Signed)
MR F/u s/p ablation

## 2013-07-10 NOTE — Progress Notes (Signed)
         Subjective:    Patient ID: [name], male    DOB: 08/03/1935, 78 y.o.   MRN: [id number]  HPI [name] is here with wife for f/u decompensated cirrhosis and hepatocellular carcinoma. He has had IR treat his [place] with Y-90 ablation (Dr. Jarvis Newcomer). Hand and leg cramps remain a problem, helped some by warm soaks Slightly slower to wake up but no signs of encephalopathy per wife  Still having dysphagia, mainly to solids, was not really helped by dilations  Medications, allergies, past medical history, past surgical history, family history and social history are reviewed and updated in the EMR.  Review of Systems + tremors in upper extremities - worsening    Objective:   Physical Exam General:  Chronically ill Eyes:   anicteric Lungs:  clear Heart:  S1S2  + 3/6 sys murmur Abdomen:  soft and nontender, BS+ Ext:   ++ edema Neuro:  A and o x 3 no asterixis, + tremors    Data Reviewed:   Recent IR evaluations and Tx Oncology notes    Assessment & Plan:  HCC (hepatocellular carcinoma) MR F/u s/p ablation  Esophageal dysmotility Trial of hyoscyamine  CIRRHOSIS, ALCOHOLIC Stable  Muscle cramps Trial of Taurine 3 g daily for this which is associated with cirrhosis   Wife asked about shingles vaccine - seems reasonable so will do so. He has DNR/Living Will  Cc: Lowella Curb, MD

## 2013-07-10 NOTE — Patient Instructions (Addendum)
We have sent the following medications to your pharmacy for you to pick up at your convenience: Generic Levsin  Try over the counter Taurine for the muscle cramps.  You have been scheduled for an MRI at [facility] on 07/18/13. Your appointment time is 1:00pm. Please arrive 15 minutes prior to your appointment time for registration purposes. There is no prep for this test. However, if you have any metal in your body, have a pacemaker or defibrillator, please be sure to let your ordering physician know. This test typically takes 45 minutes to 1 hour to complete.   Follow up with [place] in 3 months.   I appreciate the opportunity to care for you.

## 2013-07-10 NOTE — Assessment & Plan Note (Signed)
Stable

## 2013-07-10 NOTE — Assessment & Plan Note (Signed)
Trial of hyoscyamine/ 

## 2013-07-11 ENCOUNTER — Other Ambulatory Visit: Payer: Self-pay | Admitting: Medical Oncology

## 2013-07-11 MED ORDER — HYDROCODONE-ACETAMINOPHEN 5-325 MG PO TABS: 1.0000 | ORAL_TABLET | ORAL | Status: DC | PRN

## 2013-07-18 ENCOUNTER — Ambulatory Visit (HOSPITAL_COMMUNITY)
Admission: RE | Admit: 2013-07-18 | Discharge: 2013-07-18 | Disposition: A | Discharge status: Home / Self Care | Payer: Medicare Other | Source: Ambulatory Visit | Attending: Internal Medicine | Admitting: Internal Medicine

## 2013-07-18 DIAGNOSIS — C220 Liver cell carcinoma: Secondary | ICD-10-CM

## 2013-07-18 DIAGNOSIS — C228 Malignant neoplasm of liver, primary, unspecified as to type: Secondary | ICD-10-CM | POA: Insufficient documentation

## 2013-07-18 DIAGNOSIS — K7030 Alcoholic cirrhosis of liver without ascites: Secondary | ICD-10-CM

## 2013-07-18 DIAGNOSIS — K224 Dyskinesia of esophagus: Secondary | ICD-10-CM

## 2013-07-18 MED ORDER — GADOXETATE DISODIUM 0.25 MMOL/ML IV SOLN
10.0000 mL | Freq: Once | INTRAVENOUS | Status: AC | PRN
  Administered 2013-07-18: 9 mL via INTRAVENOUS

## 2013-07-19 ENCOUNTER — Telehealth: Payer: Self-pay | Admitting: Internal Medicine

## 2013-07-19 NOTE — Telephone Encounter (Signed)
I have reviewed the results with the patient's wife.  Dr. Carlean Purl sent her a note on MY chart, but she did not see it.

## 2013-07-22 ENCOUNTER — Ambulatory Visit (INDEPENDENT_AMBULATORY_CARE_PROVIDER_SITE_OTHER): Payer: Medicare Other | Admitting: Infectious Diseases

## 2013-07-22 ENCOUNTER — Encounter: Payer: Self-pay | Admitting: Infectious Diseases

## 2013-07-22 VITALS — BP 106/52 | HR 63 | Temp 98.1°F | Wt 248.0 lb

## 2013-07-22 DIAGNOSIS — E1159 Type 2 diabetes mellitus with other circulatory complications: Secondary | ICD-10-CM

## 2013-07-22 DIAGNOSIS — E1151 Type 2 diabetes mellitus with diabetic peripheral angiopathy without gangrene: Secondary | ICD-10-CM

## 2013-07-22 DIAGNOSIS — L03115 Cellulitis of right lower limb: Secondary | ICD-10-CM

## 2013-07-22 DIAGNOSIS — I872 Venous insufficiency (chronic) (peripheral): Secondary | ICD-10-CM

## 2013-07-22 DIAGNOSIS — I878 Other specified disorders of veins: Secondary | ICD-10-CM

## 2013-07-22 DIAGNOSIS — L02419 Cutaneous abscess of limb, unspecified: Secondary | ICD-10-CM

## 2013-07-22 DIAGNOSIS — I2510 Atherosclerotic heart disease of native coronary artery without angina pectoris: Secondary | ICD-10-CM

## 2013-07-22 DIAGNOSIS — L03119 Cellulitis of unspecified part of limb: Secondary | ICD-10-CM

## 2013-07-22 MED ORDER — CEPHALEXIN 500 MG PO CAPS: 500.0000 mg | ORAL_CAPSULE | Freq: Four times a day (QID) | ORAL | Status: DC

## 2013-07-22 NOTE — Progress Notes (Signed)
   Subjective:    Patient ID: [name], male    DOB: 02/10/1936, 78 y.o.   MRN: [id number]  HPI 78 yo M with hx of DM2, CABG (RLE donor site), recurrent LE cullulitis, and alcoholic chirrhosis s/p tips. He was also recently dx with hepatocellular CA. Has had XRT and seeds (March and April). Had f/u MRI (07-19-13): 1 stable tumor, 3 decreased in size.   He has been on oral Pen VK for prophylaxis of his recurrent episodes of cellulitis. He was given topical rx for his onychomycosis (terbinafine too high risk given his cirrhosis).  Has been in hospital in March and April for his leg swelling. Has been a week each time (at [facility]). Has been using his home compression machine.   Last A1C was 6.9%.   States his leg is "bad". No fever or chills, but "we expect it any time". Has been weeping more. Has had clear fluid since 5-30. Has been nauseated. Still having a lot or cramps. FSG has been erratic, drinking lots of fluids.   Has seen Dr Ron Parker, Dr Loanne Drilling. Has appt with Dr Carlean Purl next week. Has not seen Dr Linna Darner recently.  Review of Systems     Objective:   Physical Exam  Constitutional: He appears well-developed and well-nourished.  Musculoskeletal:       Legs:         Assessment & Plan:

## 2013-07-22 NOTE — Assessment & Plan Note (Signed)
Somewhat better, will continue to f/u with endo.

## 2013-07-22 NOTE — Assessment & Plan Note (Signed)
Will see if we can get him into PCP

## 2013-07-22 NOTE — Assessment & Plan Note (Addendum)
Will stop PEN and give him 2 weeks of keflex to see if we can prevent hospitalization. Keep leg elevated, wear compression device (1h bid). Is taking lasix, spironlactone. Will see if we can get him appt with Dr Linna Darner. I asked him to call in the intervening period if worsening.

## 2013-07-26 ENCOUNTER — Encounter: Payer: Self-pay | Admitting: Internal Medicine

## 2013-07-26 ENCOUNTER — Ambulatory Visit (INDEPENDENT_AMBULATORY_CARE_PROVIDER_SITE_OTHER): Payer: Medicare Other | Admitting: Internal Medicine

## 2013-07-26 VITALS — BP 118/58 | HR 63 | Temp 98.0°F | Wt 251.2 lb

## 2013-07-26 DIAGNOSIS — L97909 Non-pressure chronic ulcer of unspecified part of unspecified lower leg with unspecified severity: Principal | ICD-10-CM

## 2013-07-26 DIAGNOSIS — M5416 Radiculopathy, lumbar region: Secondary | ICD-10-CM

## 2013-07-26 DIAGNOSIS — I83009 Varicose veins of unspecified lower extremity with ulcer of unspecified site: Secondary | ICD-10-CM

## 2013-07-26 DIAGNOSIS — IMO0002 Reserved for concepts with insufficient information to code with codable children: Secondary | ICD-10-CM

## 2013-07-26 DIAGNOSIS — I2510 Atherosclerotic heart disease of native coronary artery without angina pectoris: Secondary | ICD-10-CM

## 2013-07-26 DIAGNOSIS — R609 Edema, unspecified: Secondary | ICD-10-CM

## 2013-07-26 MED ORDER — AMITRIPTYLINE HCL 10 MG PO TABS: 10.0000 mg | ORAL_TABLET | Freq: Every day | ORAL | Status: DC

## 2013-07-26 NOTE — Progress Notes (Signed)
   Subjective:    Patient ID: [name], male    DOB: Jul 21, 1935, 78 y.o.   MRN: [id number]  HPI  He was seen in [facility] 07/22/13 and diagnosed with cellulitis of the right lower extremity. This is in the context of venous stasis.  He was placed on cephalexin; followup was requested by Dr Algis Downs office  He states that he is improved. The lesion stopped weeping 07/24/13. The area of erythema is decreasing.  He continues to have nonpitting edema. This is despite wearing compression hose.  He describes cramping in his hands and legs for which he uses mustard and heat.Pain in legs radiates from spine.  Hepatic tumors shrinking with presnt treatments.        Review of Systems  He specifically denies fever, chills, or sweats  He has no purulence from the wound.    Objective:   Physical Exam Gen.: adequately nourished in appearance. Appears somewhat chronically ill. Weight excess  Eyes: No corneal or conjunctival inflammation noted.  No icterus. Ears: External  ear exam reveals no significant lesions or deformities.   Nose: External nasal exam reveals chronic bulbous  deformity w/o inflammation. Nasal mucosa are pink and moist. No lesions or exudates noted.    Neck: No deformities, masses, or tenderness noted.  Thyroid normal. Lungs: Normal respiratory effort; chest expands symmetrically. Minor basilar crackles..Decreased BS Heart: Normal rate and rhythm. Normal S1 and S2. No gallop, click, or rub. Grade 2.5 systolic murmur with bilateral radiation. Abdomen: Massive.Bowel sounds normal; abdomen soft and nontender. No masses, organomegaly or hernias noted.                                 Musculoskeletal/extremities: No clubbing or cyanosis.Tone & strength normal. Tense edema to knees. Vascular: Carotid, radial artery, dorsalis pedis and  posterior tibial pulses are equal. Unable to palpate pedal pulses due to edema. Neurologic: Flat affect ; wife is historian.  Gait slow  & deliberate      Skin: 3 X 5X96mm ulcer with eschar  R mid shin with minimal circumferential erythema. No purulence. Lymph: No cervical, axillary, or inguinal lymphadenopathy present. Psych: Mood and affect are normal. Normally interactive                                                                                        Assessment & Plan:  #1 shin ulcer w/o active cellulitis #2 lumbar radiculopathy #3 edema with intake of mustard which has high sodium content See orders

## 2013-07-26 NOTE — Progress Notes (Signed)
HPI: Pt saw Dr. Verlin Grills on 6/1 for an office visit. At this visit he was having right lower extremity cellulitis. There was a small break in the skin that was weeping at the visit. He was put on Keflex for 2 weeks and to use compression hose. The appointment for Dr. Linna Darner today was made by Dr. Algis Downs office for FU.   Patient and wife feels that it is improving. The weeping stopped on Wednesday and the redness is receding. He is able to use his compression stocking as of yesterday.   Today his leg is edematous, no warmth over redness, the site that was previously weeping has eschar and is approx. 0.5 cm x 0.5 cm or less. He has non-pitting edema today.   ROS: Patient denies fever, chills, or sweats. Denies worsening of site.  Lots of bruising on arms and discoloration of extremities. He has had previous infusions of platelets in March and April.  See Dr. Clayborn Heron note for more.Marland KitchenMarland Kitchen

## 2013-07-26 NOTE — Patient Instructions (Addendum)
   Please take the low-dose amitriptyline 10 mg at bedtime. This should help the radicular pain in legs as well as the hand symptoms.  Of concern is using mustard as it has high salt intake which can aggravate edema.

## 2013-07-26 NOTE — Progress Notes (Signed)
Pre visit review using our clinic review tool, if applicable. No additional management support is needed unless otherwise documented below in the visit note. 

## 2013-07-31 ENCOUNTER — Encounter: Payer: Self-pay | Admitting: Endocrinology

## 2013-07-31 ENCOUNTER — Ambulatory Visit (INDEPENDENT_AMBULATORY_CARE_PROVIDER_SITE_OTHER): Payer: Medicare Other | Admitting: Endocrinology

## 2013-07-31 VITALS — BP 126/60 | HR 67 | Temp 98.3°F | Ht 71.5 in | Wt 254.0 lb

## 2013-07-31 DIAGNOSIS — IMO0002 Reserved for concepts with insufficient information to code with codable children: Secondary | ICD-10-CM

## 2013-07-31 DIAGNOSIS — I2510 Atherosclerotic heart disease of native coronary artery without angina pectoris: Secondary | ICD-10-CM

## 2013-07-31 DIAGNOSIS — E1165 Type 2 diabetes mellitus with hyperglycemia: Secondary | ICD-10-CM

## 2013-07-31 DIAGNOSIS — E118 Type 2 diabetes mellitus with unspecified complications: Principal | ICD-10-CM

## 2013-07-31 LAB — HEMOGLOBIN A1C: Hgb A1c MFr Bld: 6.9 % — ABNORMAL HIGH (ref 4.6–6.5)

## 2013-07-31 NOTE — Progress Notes (Signed)
Subjective:    Patient ID: [name], male    DOB: July 28, 1935, 78 y.o.   MRN: [id number]  HPI Pt returns for f/u of insulin-requiring DM (dx'ed 1992, when he presented with polyuria; He has moderate neuropathy of the lower extremities, and associated PAD, and CAD; he has never had pancreatitis, severe hypoglycemia or DKA; he takes TID novolog; the pattern of his cbg's says he does not need basal insulin).  he brings a record of his cbg's which i have reviewed today.  He still checks in am only.  It varies from 127-223.  pt states he feels well in general, except for intermittent tremor.   Past Medical History  Diagnosis Date  . Diverticulosis   . Gastric antral vascular ectasia   . Rhinophyma   . CAD (coronary artery disease)     myoview 2007 no ischemia/ no ASA because of GI disease  . Atrial fibrillation     amiodarone in past, but problems and left off meds.  . Aortic stenosis     echo, September, 2010, mild aortic insufficiency  . Visual loss, one eye     right eye-ophthalmic arterial branch occlusion  . Internal hemorrhoids   . GERD (gastroesophageal reflux disease)   . Hypertension   . Allergic rhinitis   . Anemia     multifactorial  . Cirrhosis, alcoholic   . Thrombocytopenia   . Bleeding esophageal varices   . Encephalopathy, unspecified   . Gout   . Short-segment Barrett's esophagus     suspected  . Venous insufficiency   . Ejection fraction     EF 55%, echo, September, 2010  . Carotid bruit     Dopplers okay in the past  . Cellulitis     recurrent to RLE/notes 06/21/2012  . CHF (congestive heart failure)   . Sinus bradycardia     January, 2014  . Diastolic CHF     January, 2014  . Complication of anesthesia     "quit breathing once a long time ago; dropped my BP too; it was after I was given Dilaudid" (06/21/2012)  . Heart murmur   . Anginal pain   . Shortness of breath     "stay that way most of the time now" (06/21/2012)  . OSA on CPAP   . Diabetes  mellitus, type 2   . History of blood transfusion     "not related to OR" (06/21/2012)  . H/O hiatal hernia   . Osteoarthritis     "ALL OVER" (06/21/2012)  . Chronic lower back pain   . Aspiration into airway-silent on modified barium swallow 03/20/2013  . Melanoma     resected by [facility] 2008  . Skin cancer     Basal cell and squamous cell  . Esophageal dysmotility 10/09/2012    Past Surgical History  Procedure Laterality Date  . Total knee arthroplasty Right   . Esophagogastroduodenoscopy  04/10/2008    esophageal varices, cardia varix  . Tips procedure  2/10  . Back surgery      X5 "for ruptured disc; last time did a fusion" (06/21/2012)  . Excisional hemorrhoidectomy    . Release scar contracture / graft repairs of hand      bilaterally   . Elbow surgery Right   . Hiatal hernia repair    . Colonoscopy w/ polypectomy  02/14/2007    adenomatous polyps, diverticulosis, internal hemorrhoids/rectal varices  . Cataract extraction, bilateral  January 2013  . Tonsillectomy    .  Appendectomy    . Joint replacement    . Coronary artery bypass graft  1993    6 Bypass  . Cataract extraction w/ intraocular lens  implant, bilateral      History   Social History  . Marital Status: Married    Spouse Name: N/A    Number of Children: N/A  . Years of Education: N/A   Occupational History  .      retired   Social History Main Topics  . Smoking status: Former Smoker -- 2.00 packs/day for 20 years    Types: Cigarettes    Quit date: 02/22/1975  . Smokeless tobacco: Never Used  . Alcohol Use: No     Comment: 06/21/2012 "none since Feb 2010"  . Drug Use: No  . Sexual Activity: No   Other Topics Concern  . Not on file   Social History Narrative   Patient does not get regular exercise   Quit smoking over 40 years ago    Current Outpatient Prescriptions on File Prior to Visit  Medication Sig Dispense Refill  . acetaminophen (TYLENOL) 500 MG tablet Take 1,000 mg by mouth every 6  (six) hours as needed for mild pain.       Marland Kitchen allopurinol (ZYLOPRIM) 300 MG tablet Take 150 mg by mouth every morning.       Marland Kitchen amitriptyline (ELAVIL) 10 MG tablet Take 1 tablet (10 mg total) by mouth at bedtime.  30 tablet  2  . Ascorbic Acid (VITAMIN C) 500 MG tablet Take 500 mg by mouth daily.        . Calcium-Magnesium-Zinc (CALCIUM/MAGNESIUM/ZINC FORMULA) 1000-500-50 MG TABS Take 1 tablet by mouth every morning.       . cephALEXin (KEFLEX) 500 MG capsule Take 1 capsule (500 mg total) by mouth 4 (four) times daily.  42 capsule  0  . fentaNYL (DURAGESIC - DOSED MCG/HR) 100 MCG/HR Place 100 mcg onto the skin every 3 (three) days.      . Ferrous Gluconate (IRON) 240 (27 FE) MG TABS Take 1 tablet by mouth 2 (two) times daily.       . furosemide (LASIX) 40 MG tablet Take 1 tablet (40 mg total) by mouth 2 (two) times daily.  180 tablet  1  . gabapentin (NEURONTIN) 300 MG capsule TAKE ONE CAPSULE BY MOUTH THREE TIMES DAILY  90 capsule  1  . glucose blood (FREESTYLE LITE) test strip 1 each by Other route 2 (two) times daily. And lancets: Dx code 250.01  100 each  5  . HYDROcodone-acetaminophen (NORCO/VICODIN) 5-325 MG per tablet Take 1-2 tablets by mouth every 4 (four) hours as needed for moderate pain.  30 tablet  0  . hyoscyamine (LEVSIN SL) 0.125 MG SL tablet Place 1 tablet (0.125 mg total) under the tongue every 4 (four) hours as needed.  90 tablet  5  . insulin aspart (NOVOLOG) 100 UNIT/ML injection Inject 10-40 Units into the skin 3 (three) times daily before meals. 25 units with breakfast, 15 units with lunch and 45 units with the evening meal      . Insulin Syringe-Needle U-100 (INSULIN SYRINGE .5CC/31GX5/16") 31G X 5/16" 0.5 ML MISC Use one syringe 4 times daily. Dx code 250.01  120 each  6  . isosorbide mononitrate (IMDUR) 60 MG 24 hr tablet TAKE ONE TABLET BY MOUTH ONCE DAILY  90 tablet  1  . lactulose (CHRONULAC) 10 GM/15ML solution Take 30 mLs (20 g total) by mouth 2 (two) times daily.  1892  mL  5  . loratadine (CLARITIN) 10 MG tablet Take 10 mg by mouth every morning.       . metoprolol tartrate (LOPRESSOR) 25 MG tablet Take 0.5 tablets (12.5 mg total) by mouth 2 (two) times daily.  30 tablet  1  . nitroGLYCERIN (NITROSTAT) 0.4 MG SL tablet Place 1 tablet (0.4 mg total) under the tongue every 5 (five) minutes as needed for chest pain.  20 tablet  0  . omeprazole (PRILOSEC OTC) 20 MG tablet Take 20 mg by mouth daily.      . ondansetron (ZOFRAN) 8 MG tablet Take 1 tablet (8 mg total) by mouth every 12 (twelve) hours as needed for nausea or vomiting.  30 tablet  0  . polyethylene glycol (MIRALAX / GLYCOLAX) packet Take 17 g by mouth daily as needed. Take two or three times a week      . predniSONE (DELTASONE) 5 MG tablet Take 5 mg by mouth daily.       . rifaximin (XIFAXAN) 550 MG TABS tablet Take 550 mg by mouth 2 (two) times daily.      Marland Kitchen saccharomyces boulardii (FLORASTOR) 250 MG capsule Take 1 capsule (250 mg total) by mouth 2 (two) times daily.  60 capsule  0  . spironolactone (ALDACTONE) 50 MG tablet Take 1 tablet (50 mg total) by mouth daily.  90 tablet  1  . Thiamine HCl (VITAMIN B-1) 250 MG tablet Take 125 mg by mouth daily.        No current facility-administered medications on file prior to visit.    Allergies  Allergen Reactions  . Shellfish-Derived Products     Rash & angioedema  . Doxycycline Hyclate     Diarrhea   . Amoxicillin-Pot Clavulanate     Unsure what reaction was but pt tolerated penicillin 05/25/2012  . Aspirin     REACTION: unable to tolerate due to cirrhosis and varices  . Buprenorphine Hcl-Naloxone Hcl Other (See Comments)    unresponsive  . Codeine     Rash    . Ezetimibe     REACTION: LEG/JOINT PAIN  . Hydromorphone Hcl     unknown  . Meperidine Hcl     unknown  . Morphine     unknown  . Morphine Sulfate     REACTION: unspecified  . Oxycodone     hallucinations  . Sulfonamide Derivatives     Family History  Problem Relation Age of  Onset  . Alzheimer's disease Mother   . Cancer Father     GI (stomach?)  . Heart disease Sister     2 sisters  . Liver disease Sister   . Diabetes Brother   . Heart disease Brother     3 brothers  . Liver disease Brother   . Heart attack Other     sibling  . Melanoma Other     sibling  . Stroke Other     sibling  . Colon cancer Neg Hx     BP 126/60  Pulse 67  Temp(Src) 98.3 F (36.8 C) (Oral)  Ht 5' 11.5" (1.816 m)  Wt 254 lb (115.214 kg)  BMI 34.94 kg/m2  SpO2 93%  Review of Systems He denies hypoglycemia and weight change    Objective:   Physical Exam Pulses: dorsalis pedis intact bilat, but decreased from normal. Feet: no deformity. no ulcer on the feet. normal color and temp. There is 2+ right leg edema, and 1+ on the left. There  is bilteral onychomycosis. There are bilat old healed surgical (leg vein harvest) scars. There are several abrasions on the legs, and scaly skin, but no ulcer. There are heavy calluses on the feet.  Neuro: sensation is intact to touch on the feet, but decreased from normal.    Lab Results  Component Value Date   HGBA1C 6.9* 07/31/2013      Assessment & Plan:  DM: well-controlled. Noncompliance with cbg monitoring: not improved.  I'll work around this as best I can, but pt is advised to check cbg at other times of day.      Patient is advised the following: Patient Instructions  Please continue the novolog, 25 units with breakfast, 15 units with lunch and 45 units with the evening meal.  Also, if you eat at bedtime, take 5 units then. check your blood sugar 2 times a day.  vary the time of day when you check, between before the 3 meals, and at bedtime.  also check if you have symptoms of your blood sugar being too high or too low.  please keep a record of the readings and bring it to your next appointment here.  please call us sooner if you are having low blood sugar episodes.  It is very important to vary the time of day when you check,  including at bedtime.   Please come back for a follow-up appointment in 3 months.    A diabetes blood test is requested for you today.  We'll contact you with results.

## 2013-07-31 NOTE — Patient Instructions (Addendum)
Please continue the novolog, 25 units with breakfast, 15 units with lunch and 45 units with the evening meal.  Also, if you eat at bedtime, take 5 units then. check your blood sugar 2 times a day.  vary the time of day when you check, between before the 3 meals, and at bedtime.  also check if you have symptoms of your blood sugar being too high or too low.  please keep a record of the readings and bring it to your next appointment here.  please call us sooner if you are having low blood sugar episodes.  It is very important to vary the time of day when you check, including at bedtime.   Please come back for a follow-up appointment in 3 months.    A diabetes blood test is requested for you today.  We'll contact you with results.

## 2013-08-02 ENCOUNTER — Inpatient Hospital Stay (HOSPITAL_COMMUNITY): Payer: Medicare Other

## 2013-08-02 ENCOUNTER — Inpatient Hospital Stay (HOSPITAL_COMMUNITY)
Admission: EM | Admit: 2013-08-02 | Discharge: 2013-08-06 | DRG: 435 | Disposition: A | Discharge status: Home / Self Care | Payer: Medicare Other | Attending: Internal Medicine | Admitting: Internal Medicine

## 2013-08-02 ENCOUNTER — Emergency Department (HOSPITAL_COMMUNITY): Payer: Medicare Other

## 2013-08-02 ENCOUNTER — Encounter (HOSPITAL_COMMUNITY): Payer: Self-pay | Admitting: Emergency Medicine

## 2013-08-02 DIAGNOSIS — R109 Unspecified abdominal pain: Secondary | ICD-10-CM

## 2013-08-02 DIAGNOSIS — Z8249 Family history of ischemic heart disease and other diseases of the circulatory system: Secondary | ICD-10-CM

## 2013-08-02 DIAGNOSIS — R202 Paresthesia of skin: Secondary | ICD-10-CM

## 2013-08-02 DIAGNOSIS — C220 Liver cell carcinoma: Secondary | ICD-10-CM

## 2013-08-02 DIAGNOSIS — Z820 Family history of epilepsy and other diseases of the nervous system: Secondary | ICD-10-CM

## 2013-08-02 DIAGNOSIS — E7220 Disorder of urea cycle metabolism, unspecified: Secondary | ICD-10-CM

## 2013-08-02 DIAGNOSIS — E782 Mixed hyperlipidemia: Secondary | ICD-10-CM

## 2013-08-02 DIAGNOSIS — Z66 Do not resuscitate: Secondary | ICD-10-CM | POA: Diagnosis present

## 2013-08-02 DIAGNOSIS — Z833 Family history of diabetes mellitus: Secondary | ICD-10-CM

## 2013-08-02 DIAGNOSIS — R5383 Other fatigue: Secondary | ICD-10-CM

## 2013-08-02 DIAGNOSIS — M79602 Pain in left arm: Secondary | ICD-10-CM

## 2013-08-02 DIAGNOSIS — I2510 Atherosclerotic heart disease of native coronary artery without angina pectoris: Secondary | ICD-10-CM

## 2013-08-02 DIAGNOSIS — R252 Cramp and spasm: Secondary | ICD-10-CM

## 2013-08-02 DIAGNOSIS — A0472 Enterocolitis due to Clostridium difficile, not specified as recurrent: Secondary | ICD-10-CM

## 2013-08-02 DIAGNOSIS — M545 Low back pain, unspecified: Secondary | ICD-10-CM

## 2013-08-02 DIAGNOSIS — Z808 Family history of malignant neoplasm of other organs or systems: Secondary | ICD-10-CM

## 2013-08-02 DIAGNOSIS — E440 Moderate protein-calorie malnutrition: Secondary | ICD-10-CM

## 2013-08-02 DIAGNOSIS — E119 Type 2 diabetes mellitus without complications: Secondary | ICD-10-CM

## 2013-08-02 DIAGNOSIS — K224 Dyskinesia of esophagus: Secondary | ICD-10-CM

## 2013-08-02 DIAGNOSIS — I8510 Secondary esophageal varices without bleeding: Secondary | ICD-10-CM | POA: Diagnosis present

## 2013-08-02 DIAGNOSIS — I359 Nonrheumatic aortic valve disorder, unspecified: Secondary | ICD-10-CM | POA: Diagnosis present

## 2013-08-02 DIAGNOSIS — E1151 Type 2 diabetes mellitus with diabetic peripheral angiopathy without gangrene: Secondary | ICD-10-CM

## 2013-08-02 DIAGNOSIS — C228 Malignant neoplasm of liver, primary, unspecified as to type: Principal | ICD-10-CM | POA: Diagnosis present

## 2013-08-02 DIAGNOSIS — R531 Weakness: Secondary | ICD-10-CM

## 2013-08-02 DIAGNOSIS — N183 Chronic kidney disease, stage 3 unspecified: Secondary | ICD-10-CM | POA: Diagnosis present

## 2013-08-02 DIAGNOSIS — R188 Other ascites: Secondary | ICD-10-CM

## 2013-08-02 DIAGNOSIS — L0390 Cellulitis, unspecified: Secondary | ICD-10-CM

## 2013-08-02 DIAGNOSIS — R5381 Other malaise: Secondary | ICD-10-CM

## 2013-08-02 DIAGNOSIS — K7030 Alcoholic cirrhosis of liver without ascites: Secondary | ICD-10-CM

## 2013-08-02 DIAGNOSIS — K2270 Barrett's esophagus without dysplasia: Secondary | ICD-10-CM | POA: Diagnosis present

## 2013-08-02 DIAGNOSIS — Z96659 Presence of unspecified artificial knee joint: Secondary | ICD-10-CM

## 2013-08-02 DIAGNOSIS — K219 Gastro-esophageal reflux disease without esophagitis: Secondary | ICD-10-CM

## 2013-08-02 DIAGNOSIS — E1169 Type 2 diabetes mellitus with other specified complication: Secondary | ICD-10-CM | POA: Diagnosis present

## 2013-08-02 DIAGNOSIS — R609 Edema, unspecified: Secondary | ICD-10-CM

## 2013-08-02 DIAGNOSIS — D509 Iron deficiency anemia, unspecified: Secondary | ICD-10-CM

## 2013-08-02 DIAGNOSIS — G4730 Sleep apnea, unspecified: Secondary | ICD-10-CM

## 2013-08-02 DIAGNOSIS — R4182 Altered mental status, unspecified: Secondary | ICD-10-CM | POA: Diagnosis present

## 2013-08-02 DIAGNOSIS — H34239 Retinal artery branch occlusion, unspecified eye: Secondary | ICD-10-CM

## 2013-08-02 DIAGNOSIS — R9430 Abnormal result of cardiovascular function study, unspecified: Secondary | ICD-10-CM

## 2013-08-02 DIAGNOSIS — Z951 Presence of aortocoronary bypass graft: Secondary | ICD-10-CM

## 2013-08-02 DIAGNOSIS — I5030 Unspecified diastolic (congestive) heart failure: Secondary | ICD-10-CM

## 2013-08-02 DIAGNOSIS — I129 Hypertensive chronic kidney disease with stage 1 through stage 4 chronic kidney disease, or unspecified chronic kidney disease: Secondary | ICD-10-CM | POA: Diagnosis present

## 2013-08-02 DIAGNOSIS — S81009A Unspecified open wound, unspecified knee, initial encounter: Secondary | ICD-10-CM

## 2013-08-02 DIAGNOSIS — J309 Allergic rhinitis, unspecified: Secondary | ICD-10-CM

## 2013-08-02 DIAGNOSIS — N289 Disorder of kidney and ureter, unspecified: Secondary | ICD-10-CM

## 2013-08-02 DIAGNOSIS — D696 Thrombocytopenia, unspecified: Secondary | ICD-10-CM

## 2013-08-02 DIAGNOSIS — L97929 Non-pressure chronic ulcer of unspecified part of left lower leg with unspecified severity: Secondary | ICD-10-CM

## 2013-08-02 DIAGNOSIS — K7290 Hepatic failure, unspecified without coma: Secondary | ICD-10-CM | POA: Diagnosis present

## 2013-08-02 DIAGNOSIS — I350 Nonrheumatic aortic (valve) stenosis: Secondary | ICD-10-CM

## 2013-08-02 DIAGNOSIS — R001 Bradycardia, unspecified: Secondary | ICD-10-CM

## 2013-08-02 DIAGNOSIS — Z515 Encounter for palliative care: Secondary | ICD-10-CM

## 2013-08-02 DIAGNOSIS — R0989 Other specified symptoms and signs involving the circulatory and respiratory systems: Secondary | ICD-10-CM

## 2013-08-02 DIAGNOSIS — I509 Heart failure, unspecified: Secondary | ICD-10-CM

## 2013-08-02 DIAGNOSIS — G589 Mononeuropathy, unspecified: Secondary | ICD-10-CM

## 2013-08-02 DIAGNOSIS — N400 Enlarged prostate without lower urinary tract symptoms: Secondary | ICD-10-CM

## 2013-08-02 DIAGNOSIS — Z923 Personal history of irradiation: Secondary | ICD-10-CM

## 2013-08-02 DIAGNOSIS — I10 Essential (primary) hypertension: Secondary | ICD-10-CM

## 2013-08-02 DIAGNOSIS — Z8505 Personal history of malignant neoplasm of liver: Secondary | ICD-10-CM

## 2013-08-02 DIAGNOSIS — S91009A Unspecified open wound, unspecified ankle, initial encounter: Secondary | ICD-10-CM

## 2013-08-02 DIAGNOSIS — F1020 Alcohol dependence, uncomplicated: Secondary | ICD-10-CM | POA: Diagnosis present

## 2013-08-02 DIAGNOSIS — S81809A Unspecified open wound, unspecified lower leg, initial encounter: Secondary | ICD-10-CM

## 2013-08-02 DIAGNOSIS — I890 Lymphedema, not elsewhere classified: Secondary | ICD-10-CM

## 2013-08-02 DIAGNOSIS — G4733 Obstructive sleep apnea (adult) (pediatric): Secondary | ICD-10-CM | POA: Diagnosis present

## 2013-08-02 DIAGNOSIS — Z823 Family history of stroke: Secondary | ICD-10-CM

## 2013-08-02 DIAGNOSIS — R7989 Other specified abnormal findings of blood chemistry: Secondary | ICD-10-CM

## 2013-08-02 DIAGNOSIS — K7682 Hepatic encephalopathy: Secondary | ICD-10-CM

## 2013-08-02 DIAGNOSIS — Z860100 Personal history of colon polyps, unspecified: Secondary | ICD-10-CM

## 2013-08-02 DIAGNOSIS — I878 Other specified disorders of veins: Secondary | ICD-10-CM

## 2013-08-02 DIAGNOSIS — D649 Anemia, unspecified: Secondary | ICD-10-CM

## 2013-08-02 DIAGNOSIS — M1990 Unspecified osteoarthritis, unspecified site: Secondary | ICD-10-CM

## 2013-08-02 DIAGNOSIS — T17908A Unspecified foreign body in respiratory tract, part unspecified causing other injury, initial encounter: Secondary | ICD-10-CM

## 2013-08-02 DIAGNOSIS — E162 Hypoglycemia, unspecified: Secondary | ICD-10-CM | POA: Diagnosis present

## 2013-08-02 DIAGNOSIS — L03115 Cellulitis of right lower limb: Secondary | ICD-10-CM

## 2013-08-02 DIAGNOSIS — IMO0002 Reserved for concepts with insufficient information to code with codable children: Secondary | ICD-10-CM

## 2013-08-02 DIAGNOSIS — Z9849 Cataract extraction status, unspecified eye: Secondary | ICD-10-CM

## 2013-08-02 DIAGNOSIS — M109 Gout, unspecified: Secondary | ICD-10-CM

## 2013-08-02 DIAGNOSIS — T887XXA Unspecified adverse effect of drug or medicament, initial encounter: Secondary | ICD-10-CM

## 2013-08-02 DIAGNOSIS — R112 Nausea with vomiting, unspecified: Secondary | ICD-10-CM

## 2013-08-02 DIAGNOSIS — Z87891 Personal history of nicotine dependence: Secondary | ICD-10-CM

## 2013-08-02 DIAGNOSIS — I4891 Unspecified atrial fibrillation: Secondary | ICD-10-CM

## 2013-08-02 DIAGNOSIS — I5032 Chronic diastolic (congestive) heart failure: Secondary | ICD-10-CM | POA: Diagnosis present

## 2013-08-02 LAB — COMPREHENSIVE METABOLIC PANEL
ALBUMIN: 3 g/dL — AB (ref 3.5–5.2)
ALT: 39 U/L (ref 0–53)
ALT: 37 U/L (ref 0–53)
AST: 79 U/L — ABNORMAL HIGH (ref 0–37)
AST: 72 U/L — ABNORMAL HIGH (ref 0–37)
Albumin: 2.9 g/dL — ABNORMAL LOW (ref 3.5–5.2)
Alkaline Phosphatase: 226 U/L — ABNORMAL HIGH (ref 39–117)
Alkaline Phosphatase: 217 U/L — ABNORMAL HIGH (ref 39–117)
BUN: 20 mg/dL (ref 6–23)
BUN: 20 mg/dL (ref 6–23)
CALCIUM: 9.2 mg/dL (ref 8.4–10.5)
CHLORIDE: 101 meq/L (ref 96–112)
CO2: 29 mEq/L (ref 19–32)
CO2: 30 meq/L (ref 19–32)
CREATININE: 1.24 mg/dL (ref 0.50–1.35)
Calcium: 9.2 mg/dL (ref 8.4–10.5)
Chloride: 100 mEq/L (ref 96–112)
Creatinine, Ser: 1.36 mg/dL — ABNORMAL HIGH (ref 0.50–1.35)
GFR calc Af Amer: 57 mL/min — ABNORMAL LOW (ref >90)
GFR calc Af Amer: 64 mL/min — ABNORMAL LOW (ref >90)
GFR calc non Af Amer: 49 mL/min — ABNORMAL LOW (ref >90)
GFR, EST NON AFRICAN AMERICAN: 55 mL/min — AB (ref >90)
GLUCOSE: 104 mg/dL — AB (ref 70–99)
GLUCOSE: 166 mg/dL — AB (ref 70–99)
POTASSIUM: 4.6 meq/L (ref 3.7–5.3)
Potassium: 3.6 mEq/L — ABNORMAL LOW (ref 3.7–5.3)
Sodium: 142 mEq/L (ref 137–147)
Sodium: 143 mEq/L (ref 137–147)
Total Bilirubin: 2.2 mg/dL — ABNORMAL HIGH (ref 0.3–1.2)
Total Bilirubin: 2.4 mg/dL — ABNORMAL HIGH (ref 0.3–1.2)
Total Protein: 6 g/dL (ref 6.0–8.3)
Total Protein: 5.8 g/dL — ABNORMAL LOW (ref 6.0–8.3)

## 2013-08-02 LAB — I-STAT TROPONIN, ED: Troponin i, poc: 0.01 ng/mL (ref 0.00–0.08)

## 2013-08-02 LAB — URINALYSIS, ROUTINE W REFLEX MICROSCOPIC
Bilirubin Urine: NEGATIVE
Glucose, UA: NEGATIVE mg/dL
HGB URINE DIPSTICK: NEGATIVE
Ketones, ur: NEGATIVE mg/dL
Leukocytes, UA: NEGATIVE
Nitrite: NEGATIVE
PROTEIN: NEGATIVE mg/dL
Specific Gravity, Urine: 1.015 (ref 1.005–1.030)
UROBILINOGEN UA: 0.2 mg/dL (ref 0.0–1.0)
pH: 5.5 (ref 5.0–8.0)

## 2013-08-02 LAB — CBC WITH DIFFERENTIAL/PLATELET
BASOS ABS: 0 10*3/uL (ref 0.0–0.1)
BASOS ABS: 0 10*3/uL (ref 0.0–0.1)
Basophils Relative: 0 % (ref 0–1)
Basophils Relative: 0 % (ref 0–1)
EOS ABS: 0 10*3/uL (ref 0.0–0.7)
EOS PCT: 0 % (ref 0–5)
Eosinophils Absolute: 0 10*3/uL (ref 0.0–0.7)
Eosinophils Relative: 0 % (ref 0–5)
HCT: 35 % — ABNORMAL LOW (ref 39.0–52.0)
HEMATOCRIT: 34.1 % — AB (ref 39.0–52.0)
Hemoglobin: 11.5 g/dL — ABNORMAL LOW (ref 13.0–17.0)
Hemoglobin: 11 g/dL — ABNORMAL LOW (ref 13.0–17.0)
LYMPHS ABS: 0.7 10*3/uL (ref 0.7–4.0)
Lymphocytes Relative: 17 % (ref 12–46)
Lymphocytes Relative: 19 % (ref 12–46)
Lymphs Abs: 0.7 10*3/uL (ref 0.7–4.0)
MCH: 32.9 pg (ref 26.0–34.0)
MCH: 32.4 pg (ref 26.0–34.0)
MCHC: 32.9 g/dL (ref 30.0–36.0)
MCHC: 32.3 g/dL (ref 30.0–36.0)
MCV: 100 fL (ref 78.0–100.0)
MCV: 100.3 fL — ABNORMAL HIGH (ref 78.0–100.0)
MONO ABS: 0.3 10*3/uL (ref 0.1–1.0)
Monocytes Absolute: 0.4 10*3/uL (ref 0.1–1.0)
Monocytes Relative: 10 % (ref 3–12)
Monocytes Relative: 9 % (ref 3–12)
NEUTROS PCT: 73 % (ref 43–77)
Neutro Abs: 2.9 10*3/uL (ref 1.7–7.7)
Neutro Abs: 2.8 10*3/uL (ref 1.7–7.7)
Neutrophils Relative %: 72 % (ref 43–77)
PLATELETS: 49 10*3/uL — AB (ref 150–400)
PLATELETS: 39 10*3/uL — AB (ref 150–400)
RBC: 3.5 MIL/uL — AB (ref 4.22–5.81)
RBC: 3.4 MIL/uL — ABNORMAL LOW (ref 4.22–5.81)
RDW: 17.3 % — AB (ref 11.5–15.5)
RDW: 17.1 % — ABNORMAL HIGH (ref 11.5–15.5)
WBC: 4.1 10*3/uL (ref 4.0–10.5)
WBC: 3.9 10*3/uL — ABNORMAL LOW (ref 4.0–10.5)

## 2013-08-02 LAB — AMMONIA
AMMONIA: 71 umol/L — AB (ref 11–60)
Ammonia: 34 umol/L (ref 11–60)

## 2013-08-02 LAB — PRO B NATRIURETIC PEPTIDE: Pro B Natriuretic peptide (BNP): 795.6 pg/mL — ABNORMAL HIGH (ref 0–450)

## 2013-08-02 LAB — GLUCOSE, CAPILLARY
GLUCOSE-CAPILLARY: 141 mg/dL — AB (ref 70–99)
GLUCOSE-CAPILLARY: 161 mg/dL — AB (ref 70–99)
Glucose-Capillary: 217 mg/dL — ABNORMAL HIGH (ref 70–99)
Glucose-Capillary: 232 mg/dL — ABNORMAL HIGH (ref 70–99)

## 2013-08-02 LAB — PROTIME-INR
INR: 1.41 (ref 0.00–1.49)
Prothrombin Time: 16.9 seconds — ABNORMAL HIGH (ref 11.6–15.2)

## 2013-08-02 LAB — CBG MONITORING, ED
Glucose-Capillary: 108 mg/dL — ABNORMAL HIGH (ref 70–99)
Glucose-Capillary: 118 mg/dL — ABNORMAL HIGH (ref 70–99)

## 2013-08-02 LAB — I-STAT CG4 LACTIC ACID, ED: LACTIC ACID, VENOUS: 1.77 mmol/L (ref 0.5–2.2)

## 2013-08-02 MED ORDER — RIFAXIMIN 550 MG PO TABS
550.0000 mg | ORAL_TABLET | Freq: Two times a day (BID) | ORAL | Status: DC
  Administered 2013-08-02 – 2013-08-06 (×9): 550 mg via ORAL
  Filled 2013-08-02 (×10): qty 1

## 2013-08-02 MED ORDER — LACTULOSE 10 GM/15ML PO SOLN
30.0000 g | Freq: Three times a day (TID) | ORAL | Status: DC
  Administered 2013-08-02: 30 g via ORAL
  Filled 2013-08-02 (×3): qty 45

## 2013-08-02 MED ORDER — DEXTROSE 5 % IV SOLN
1.0000 g | Freq: Three times a day (TID) | INTRAVENOUS | Status: DC
  Administered 2013-08-02 – 2013-08-05 (×9): 1 g via INTRAVENOUS
  Filled 2013-08-02 (×11): qty 1

## 2013-08-02 MED ORDER — AMITRIPTYLINE HCL 10 MG PO TABS
10.0000 mg | ORAL_TABLET | Freq: Every day | ORAL | Status: DC
  Filled 2013-08-02: qty 1

## 2013-08-02 MED ORDER — PANTOPRAZOLE SODIUM 40 MG PO TBEC
40.0000 mg | DELAYED_RELEASE_TABLET | Freq: Every day | ORAL | Status: DC
  Administered 2013-08-02 – 2013-08-06 (×5): 40 mg via ORAL
  Filled 2013-08-02 (×5): qty 1

## 2013-08-02 MED ORDER — SPIRONOLACTONE 50 MG PO TABS
50.0000 mg | ORAL_TABLET | Freq: Every day | ORAL | Status: DC
  Administered 2013-08-02 – 2013-08-06 (×5): 50 mg via ORAL
  Filled 2013-08-02 (×5): qty 1

## 2013-08-02 MED ORDER — HYOSCYAMINE SULFATE 0.125 MG PO TBDP
0.1250 mg | ORAL_TABLET | ORAL | Status: DC | PRN
  Filled 2013-08-02: qty 1

## 2013-08-02 MED ORDER — LORATADINE 10 MG PO TABS
10.0000 mg | ORAL_TABLET | Freq: Every morning | ORAL | Status: DC
  Administered 2013-08-02 – 2013-08-06 (×5): 10 mg via ORAL
  Filled 2013-08-02 (×5): qty 1

## 2013-08-02 MED ORDER — HYDROCODONE-ACETAMINOPHEN 5-325 MG PO TABS
1.0000 | ORAL_TABLET | ORAL | Status: DC | PRN
  Administered 2013-08-02: 1 via ORAL
  Filled 2013-08-02: qty 1

## 2013-08-02 MED ORDER — ONDANSETRON HCL 4 MG PO TABS
4.0000 mg | ORAL_TABLET | Freq: Four times a day (QID) | ORAL | Status: DC | PRN
  Administered 2013-08-06: 4 mg via ORAL
  Filled 2013-08-02: qty 1

## 2013-08-02 MED ORDER — POLYETHYLENE GLYCOL 3350 17 G PO PACK
17.0000 g | PACK | Freq: Every day | ORAL | Status: DC
  Filled 2013-08-02: qty 1

## 2013-08-02 MED ORDER — FERROUS SULFATE 325 (65 FE) MG PO TABS
325.0000 mg | ORAL_TABLET | Freq: Two times a day (BID) | ORAL | Status: DC
  Administered 2013-08-02 – 2013-08-06 (×9): 325 mg via ORAL
  Filled 2013-08-02 (×10): qty 1

## 2013-08-02 MED ORDER — ACETAMINOPHEN 325 MG PO TABS
650.0000 mg | ORAL_TABLET | Freq: Four times a day (QID) | ORAL | Status: DC | PRN
  Administered 2013-08-03 – 2013-08-06 (×3): 650 mg via ORAL
  Filled 2013-08-02 (×3): qty 2

## 2013-08-02 MED ORDER — OMEPRAZOLE MAGNESIUM 20 MG PO TBEC: 20.0000 mg | DELAYED_RELEASE_TABLET | Freq: Every day | ORAL | Status: DC

## 2013-08-02 MED ORDER — FENTANYL 50 MCG/HR TD PT72
100.0000 ug | MEDICATED_PATCH | TRANSDERMAL | Status: DC
Start: 2013-08-03 — End: 2013-08-02

## 2013-08-02 MED ORDER — INSULIN ASPART 100 UNIT/ML ~~LOC~~ SOLN
0.0000 [IU] | Freq: Three times a day (TID) | SUBCUTANEOUS | Status: DC
  Administered 2013-08-02: 3 [IU] via SUBCUTANEOUS
  Administered 2013-08-02: 2 [IU] via SUBCUTANEOUS
  Administered 2013-08-03: 5 [IU] via SUBCUTANEOUS
  Administered 2013-08-03: 3 [IU] via SUBCUTANEOUS
  Administered 2013-08-03 – 2013-08-04 (×2): 2 [IU] via SUBCUTANEOUS

## 2013-08-02 MED ORDER — METOPROLOL TARTRATE 12.5 MG HALF TABLET
12.5000 mg | ORAL_TABLET | Freq: Two times a day (BID) | ORAL | Status: DC
  Administered 2013-08-02 – 2013-08-06 (×8): 12.5 mg via ORAL
  Filled 2013-08-02 (×10): qty 1

## 2013-08-02 MED ORDER — NITROGLYCERIN 0.4 MG SL SUBL: 0.4000 mg | SUBLINGUAL_TABLET | SUBLINGUAL | Status: DC | PRN

## 2013-08-02 MED ORDER — VITAMIN C 500 MG PO TABS
500.0000 mg | ORAL_TABLET | Freq: Every day | ORAL | Status: DC
  Administered 2013-08-02 – 2013-08-06 (×5): 500 mg via ORAL
  Filled 2013-08-02 (×5): qty 1

## 2013-08-02 MED ORDER — ALLOPURINOL 150 MG HALF TABLET
150.0000 mg | ORAL_TABLET | Freq: Every morning | ORAL | Status: DC
  Administered 2013-08-02 – 2013-08-06 (×5): 150 mg via ORAL
  Filled 2013-08-02 (×5): qty 1

## 2013-08-02 MED ORDER — ACETAMINOPHEN 650 MG RE SUPP: 650.0000 mg | Freq: Four times a day (QID) | RECTAL | Status: DC | PRN

## 2013-08-02 MED ORDER — ONDANSETRON HCL 4 MG/2ML IJ SOLN
4.0000 mg | Freq: Four times a day (QID) | INTRAMUSCULAR | Status: DC | PRN
  Administered 2013-08-02 – 2013-08-06 (×4): 4 mg via INTRAVENOUS
  Filled 2013-08-02 (×4): qty 2

## 2013-08-02 MED ORDER — ISOSORBIDE MONONITRATE ER 60 MG PO TB24
60.0000 mg | ORAL_TABLET | Freq: Every day | ORAL | Status: DC
  Administered 2013-08-02 – 2013-08-06 (×5): 60 mg via ORAL
  Filled 2013-08-02 (×5): qty 1

## 2013-08-02 MED ORDER — TRIAMCINOLONE ACETONIDE 0.1 % EX CREA
1.0000 "application " | TOPICAL_CREAM | Freq: Every day | CUTANEOUS | Status: DC
  Administered 2013-08-02 – 2013-08-06 (×5): 1 via TOPICAL
  Filled 2013-08-02: qty 15

## 2013-08-02 MED ORDER — PREDNISONE 5 MG PO TABS
5.0000 mg | ORAL_TABLET | Freq: Every day | ORAL | Status: DC
  Administered 2013-08-02 – 2013-08-06 (×5): 5 mg via ORAL
  Filled 2013-08-02 (×5): qty 1

## 2013-08-02 MED ORDER — GABAPENTIN 300 MG PO CAPS
300.0000 mg | ORAL_CAPSULE | Freq: Three times a day (TID) | ORAL | Status: DC
Start: 2013-08-02 — End: 2013-08-02
  Administered 2013-08-02: 300 mg via ORAL
  Filled 2013-08-02 (×3): qty 1

## 2013-08-02 MED ORDER — LACTULOSE 10 GM/15ML PO SOLN
30.0000 g | Freq: Three times a day (TID) | ORAL | Status: DC
  Administered 2013-08-02 – 2013-08-05 (×8): 30 g via ORAL
  Filled 2013-08-02 (×15): qty 45

## 2013-08-02 MED ORDER — ONDANSETRON HCL 4 MG PO TABS: 8.0000 mg | ORAL_TABLET | Freq: Two times a day (BID) | ORAL | Status: DC | PRN

## 2013-08-02 MED ORDER — VITAMIN B-1 50 MG PO TABS
125.0000 mg | ORAL_TABLET | Freq: Every day | ORAL | Status: DC
  Administered 2013-08-02 – 2013-08-06 (×5): 125 mg via ORAL
  Filled 2013-08-02 (×8): qty 1

## 2013-08-02 MED ORDER — TAMSULOSIN HCL 0.4 MG PO CAPS
0.4000 mg | ORAL_CAPSULE | Freq: Every day | ORAL | Status: DC
  Administered 2013-08-02 – 2013-08-06 (×5): 0.4 mg via ORAL
  Filled 2013-08-02 (×5): qty 1

## 2013-08-02 MED ORDER — PANTOPRAZOLE SODIUM 40 MG IV SOLR
40.0000 mg | Freq: Once | INTRAVENOUS | Status: AC
Start: 2013-08-02 — End: 2013-08-02
  Administered 2013-08-02: 40 mg via INTRAVENOUS
  Filled 2013-08-02: qty 40

## 2013-08-02 MED ORDER — SODIUM CHLORIDE 0.9 % IJ SOLN
3.0000 mL | Freq: Two times a day (BID) | INTRAMUSCULAR | Status: DC
  Administered 2013-08-02 – 2013-08-06 (×8): 3 mL via INTRAVENOUS

## 2013-08-02 MED ORDER — FUROSEMIDE 40 MG PO TABS
40.0000 mg | ORAL_TABLET | Freq: Two times a day (BID) | ORAL | Status: DC
  Administered 2013-08-02 – 2013-08-06 (×10): 40 mg via ORAL
  Filled 2013-08-02 (×11): qty 1

## 2013-08-02 NOTE — ED Notes (Signed)
Per EMS: pt from home with syncopal episode and altered mental status. Pt was at home eating dinner when he became lethargic and fell.  Pt noted to be hypoglycemic with EMS at 61, CBG 108 on arrival.  Pt given D50 with EMS.  Pt SB on the monitor with PVCs.  Pt lethargic and arousable on arrival. Pt has hx of Cancer.  Redness and swelling noted to right leg.  Per EMS pt's wife reports drainage from leg.

## 2013-08-02 NOTE — Consult Note (Signed)
Patient [name]      DOB: [birth year]      ONG:[id number]     Consult Note from the Palliative Medicine Team at [facility] Requested by: Dr Conley Canal     PCP: Unice Cobble, MD Reason for Consultation: Clarification of [place] and options     Phone Number:[phone]  Assessment of patients Current state:    Hepatocellular carcinoma; presently under treatment with Dr Vernard Gambles and Dr Juliann Mule and Dr Carlean Purl.  PMH cirrhosis/alcoholic.   Patient's wife verbalizes understanding that disease is not curable, but per wife family are hopeful for "containment " and  "a bit more time".  Admitted after a fall in his home and significant change in mental status/ hepatic encephalopathy     Consult is for review of medical treatment options, clarification of goals of care and end of life issues, disposition and options, and symptom recommendation.  This NP Wadie Lessen reviewed medical records, received report from team, assessed the patient and then meet at the patient's bedside along with his wife [name]  to discuss diagnosis prognosis, GOC, symptom management and options.  A discussion was had today regarding advanced directives.  Concepts specific to code status, artifical feeding and hydration was had.  The difference between a aggressive medical intervention path  and a palliative comfort care path for this patient at this time was had.  Values and goals of care important to patient and family were attempted to be elicited.  Concept of Hospice and Palliative Care were discussed  Questions and concerns addressed.   Family encouraged to call with questions or concerns.   Mrs Show plans to speak to her two children and set a time to re-meet this week-end with Dr Lovena Le for further discussion regarding overall plan of care.  Patient is too lethargic to participate today but his wife verbalizes her understanding of the limited prognosis and her concern that her children are "in  denial".  She is hopeful that meeting with the Palliative Medicine Team will deepen their understanding of the medcial sitaution.    PMT will continue to support holistically.   Goals of Care: 1.  Code Status:  DNR/DNI   2. Scope of Treatment: At this time family verbalizes that they are open to all available and offered medical interventions to prolong life.  He is currently under Dr Hassel/Radiology, Dr Delrae Rend, and Dr Chism/Oncology.  Family wishes to include above mentioned physicians in treatment plan of  this hospitalization  3. Disposition:  Home when medically stable, hospice services may be appropriate dependant on outcomes   4. Symptom Management:   1.  Weakness/  Encephalopathy:  Continued medical treatment of treatable disease, hopeful for improvement  5. Psychosocial:  Emotional support offered to wife, this is a very difficult time for her coming to terms with her husband of 30 years mortality.     Brief HPI:  [name] is a 78 y.o. male history of hepatocellular carcinoma status post radiation seeding, cirrhosis of liver, diabetes mellitus, CAD.   Admitted after a fall at home through the ER.  Now with encephalopathy and metabolic imbalances and ascites.     ROS: unable to illicit due to decreased mental status/ encephalopathy    PMH:  Past Medical History  Diagnosis Date  . Diverticulosis   . Gastric antral vascular ectasia   . Rhinophyma   . CAD (coronary artery disease)     myoview 2007 no ischemia/ no ASA because  of GI disease  . Atrial fibrillation     amiodarone in past, but problems and left off meds.  . Aortic stenosis     echo, September, 2010, mild aortic insufficiency  . Visual loss, one eye     right eye-ophthalmic arterial branch occlusion  . Internal hemorrhoids   . GERD (gastroesophageal reflux disease)   . Hypertension   . Allergic rhinitis   . Anemia     multifactorial  . Cirrhosis, alcoholic   . Thrombocytopenia   . Bleeding  esophageal varices   . Encephalopathy, unspecified   . Gout   . Short-segment Barrett's esophagus     suspected  . Venous insufficiency   . Ejection fraction     EF 55%, echo, September, 2010  . Carotid bruit     Dopplers okay in the past  . Cellulitis     recurrent to RLE/notes 06/21/2012  . CHF (congestive heart failure)   . Sinus bradycardia     January, 2014  . Diastolic CHF     January, 2014  . Complication of anesthesia     "quit breathing once a long time ago; dropped my BP too; it was after I was given Dilaudid" (06/21/2012)  . Heart murmur   . Anginal pain   . Shortness of breath     "stay that way most of the time now" (06/21/2012)  . OSA on CPAP   . Diabetes mellitus, type 2   . History of blood transfusion     "not related to OR" (06/21/2012)  . H/O hiatal hernia   . Osteoarthritis     "ALL OVER" (06/21/2012)  . Chronic lower back pain   . Aspiration into airway-silent on modified barium swallow 03/20/2013  . Melanoma     rese[facility] by [facility] 2008  . Skin cancer     Basal cell and squamous cell  . Esophageal dysmotility 10/09/2012     PSH: Past Surgical History  Procedure Laterality Date  . Total knee arthroplasty Right   . Esophagogastroduodenoscopy  04/10/2008    esophageal varices, cardia varix  . Tips procedure  2/10  . Back surgery      X5 "for ruptured disc; last time did a fusion" (06/21/2012)  . Excisional hemorrhoidectomy    . Release scar contracture / graft repairs of hand      bilaterally   . Elbow surgery Right   . Hiatal hernia repair    . Colonoscopy w/ polypectomy  02/14/2007    adenomatous polyps, diverticulosis, internal hemorrhoids/rectal varices  . Cataract extraction, bilateral  January 2013  . Tonsillectomy    . Appendectomy    . Joint replacement    . Coronary artery bypass graft  1993    6 Bypass  . Cataract extraction w/ intraocular lens  implant, bilateral     I have reviewed the FH and SH and  If appropriate update it with new  information. Allergies  Allergen Reactions  . Shellfish-Derived Products     Rash & angioedema  . Doxycycline Hyclate     Diarrhea   . Amoxicillin-Pot Clavulanate     Unsure what reaction was but pt tolerated penicillin 05/25/2012  . Aspirin     REACTION: unable to tolerate due to cirrhosis and varices  . Buprenorphine Hcl-Naloxone Hcl Other (See Comments)    unresponsive  . Codeine     Rash    . Ezetimibe     REACTION: LEG/JOINT PAIN  . Hydromorphone Hcl  Per wife "pt developed respiratory distress"  . Meperidine Hcl     Per wife "his blood pressure dropped."  . Morphine     Per wife "pt sees spider webs."  . Morphine Sulfate     REACTION: unspecified  . Oxycodone     hallucinations  . Sulfonamide Derivatives Nausea And Vomiting   Scheduled Meds: . allopurinol  150 mg Oral q morning - 10a  . amitriptyline  10 mg Oral QHS  . cefoTAXime (CLAFORAN) IV  1 g Intravenous Q8H  . [START ON 08/03/2013] fentaNYL  100 mcg Transdermal Q72H  . ferrous sulfate  325 mg Oral BID  . furosemide  40 mg Oral BID  . gabapentin  300 mg Oral TID  . insulin aspart  0-9 Units Subcutaneous TID WC  . isosorbide mononitrate  60 mg Oral Daily  . lactulose  30 g Oral TID  . loratadine  10 mg Oral q morning - 10a  . metoprolol tartrate  12.5 mg Oral BID  . pantoprazole  40 mg Oral Daily  . polyethylene glycol  17 g Oral Daily  . predniSONE  5 mg Oral Daily  . rifaximin  550 mg Oral BID  . sodium chloride  3 mL Intravenous Q12H  . spironolactone  50 mg Oral Daily  . vitamin B-1  125 mg Oral Daily  . triamcinolone cream  1 application Topical Daily  . vitamin C  500 mg Oral Daily   Continuous Infusions:  PRN Meds:.acetaminophen, acetaminophen, HYDROcodone-acetaminophen, hyoscyamine, nitroGLYCERIN, ondansetron (ZOFRAN) IV, ondansetron    BP 127/62  Pulse 58  Temp(Src) 97.9 F (36.6 C) (Oral)  Resp 16  SpO2 99%   PPS:30 % at best  No intake or output data in the 24 hours ending  08/02/13 0947 LBM:  PTA                      Physical Exam:  General:  Chronically ill appearing, NAD HEENT:  Dry buccal membranes, no exudate Chest:  CTA CVS: RRR Abdomen:mildly distended, tender to gentle palpation, decreased BS Ext: BLE +1 edema, right lower leg with erythema (h/o cellulitis) Neuro: lethargic, minimally responsive to verbal stimuli  Labs: CBC    Component Value Date/Time   WBC 4.1 08/02/2013 0245   WBC 3.1* 06/28/2013 1022   RBC 3.50* 08/02/2013 0245   RBC 3.48* 06/28/2013 1022   HGB 11.5* 08/02/2013 0245   HGB 11.2* 06/28/2013 1022   HCT 35.0* 08/02/2013 0245   HCT 33.9* 06/28/2013 1022   PLT 49* 08/02/2013 0245   PLT 42* 06/28/2013 1022   MCV 100.0 08/02/2013 0245   MCV 97.4 06/28/2013 1022   MCH 32.9 08/02/2013 0245   MCH 32.2 06/28/2013 1022   MCHC 32.9 08/02/2013 0245   MCHC 33.0 06/28/2013 1022   RDW 17.3* 08/02/2013 0245   RDW 16.5* 06/28/2013 1022   LYMPHSABS 0.7 08/02/2013 0245   LYMPHSABS 0.5* 06/28/2013 1022   MONOABS 0.4 08/02/2013 0245   MONOABS 0.3 06/28/2013 1022   EOSABS 0.0 08/02/2013 0245   EOSABS 0.0 06/28/2013 1022   BASOSABS 0.0 08/02/2013 0245   BASOSABS 0.0 06/28/2013 1022    BMET    Component Value Date/Time   NA 142 08/02/2013 0245   NA 140 06/28/2013 1025   K 3.6* 08/02/2013 0245   K 3.9 06/28/2013 1025   CL 100 08/02/2013 0245   CO2 29 08/02/2013 0245   CO2 27 06/28/2013 1025   GLUCOSE 104* 08/02/2013 0245  GLUCOSE 176* 06/28/2013 1025   BUN 20 08/02/2013 0245   BUN 19.3 06/28/2013 1025   CREATININE 1.36* 08/02/2013 0245   CREATININE 1.1 06/28/2013 1025   CREATININE 1.08 06/19/2013 1254   CALCIUM 9.2 08/02/2013 0245   CALCIUM 9.2 06/28/2013 1025   GFRNONAA 49* 08/02/2013 0245   GFRAA 57* 08/02/2013 0245    CMP     Component Value Date/Time   NA 142 08/02/2013 0245   NA 140 06/28/2013 1025   K 3.6* 08/02/2013 0245   K 3.9 06/28/2013 1025   CL 100 08/02/2013 0245   CO2 29 08/02/2013 0245   CO2 27 06/28/2013 1025   GLUCOSE 104* 08/02/2013 0245   GLUCOSE 176* 06/28/2013  1025   BUN 20 08/02/2013 0245   BUN 19.3 06/28/2013 1025   CREATININE 1.36* 08/02/2013 0245   CREATININE 1.1 06/28/2013 1025   CREATININE 1.08 06/19/2013 1254   CALCIUM 9.2 08/02/2013 0245   CALCIUM 9.2 06/28/2013 1025   PROT 6.0 08/02/2013 0245   PROT 5.9* 06/28/2013 1025   ALBUMIN 3.0* 08/02/2013 0245   ALBUMIN 3.1* 06/28/2013 1025   AST 79* 08/02/2013 0245   AST 50* 06/28/2013 1025   ALT 39 08/02/2013 0245   ALT 26 06/28/2013 1025   ALKPHOS 226* 08/02/2013 0245   ALKPHOS 118 06/28/2013 1025   BILITOT 2.2* 08/02/2013 0245   BILITOT 1.46* 06/28/2013 1025   GFRNONAA 49* 08/02/2013 0245   GFRAA 57* 08/02/2013 0245     Time In Time Out Total Time Spent with Patient Total Overall Time  0930 1030 60 min 60 min    Greater than 50%  of this time was spent counseling and coordinating care related to the above assessment and plan.   Wadie Lessen NP  Palliative Medicine Team Team Phone # [phone] Pager [phone]  Discussed with Dr Conley Canal

## 2013-08-02 NOTE — ED Provider Notes (Signed)
CSN: [id number]     Arrival date & time 08/02/13  0222 History   First MD Initiated Contact with Patient 08/02/13 0300     Chief Complaint  Patient presents with  . Altered Mental Status  . Loss of Consciousness     (Consider location/radiation/quality/duration/timing/severity/associated sxs/prior Treatment) Patient is a 78 y.o. male presenting with altered mental status and syncope. The history is provided by the patient.  Altered Mental Status Loss of Consciousness He was in a recliner when he felt like he was getting disoriented and was worried that his blood sugar may have dropped. He got out of the recliner and walked about 10 feet before he passed out. He does not recall any warning that he was going to pass out. He denies chest pain, palpitations, dizziness. He is complaining of some pain in his epigastric area without which started after he had fallen. EMS was some called and noted a blood sugar of 61 and was given dextrose intravenously. He has had problems with hypoglycemia in the past. After he passed out, family noted that he was slow to respond but now they state that he is now back to his baseline. His only current complaint is a mild epigastric pain.  Past Medical History  Diagnosis Date  . Diverticulosis   . Gastric antral vascular ectasia   . Rhinophyma   . CAD (coronary artery disease)     myoview 2007 no ischemia/ no ASA because of GI disease  . Atrial fibrillation     amiodarone in past, but problems and left off meds.  . Aortic stenosis     echo, September, 2010, mild aortic insufficiency  . Visual loss, one eye     right eye-ophthalmic arterial branch occlusion  . Internal hemorrhoids   . GERD (gastroesophageal reflux disease)   . Hypertension   . Allergic rhinitis   . Anemia     multifactorial  . Cirrhosis, alcoholic   . Thrombocytopenia   . Bleeding esophageal varices   . Encephalopathy, unspecified   . Gout   . Short-segment Barrett's esophagus    suspected  . Venous insufficiency   . Ejection fraction     EF 55%, echo, September, 2010  . Carotid bruit     Dopplers okay in the past  . Cellulitis     recurrent to RLE/notes 06/21/2012  . CHF (congestive heart failure)   . Sinus bradycardia     January, 2014  . Diastolic CHF     January, 2014  . Complication of anesthesia     "quit breathing once a long time ago; dropped my BP too; it was after I was given Dilaudid" (06/21/2012)  . Heart murmur   . Anginal pain   . Shortness of breath     "stay that way most of the time now" (06/21/2012)  . OSA on CPAP   . Diabetes mellitus, type 2   . History of blood transfusion     "not related to OR" (06/21/2012)  . H/O hiatal hernia   . Osteoarthritis     "ALL OVER" (06/21/2012)  . Chronic lower back pain   . Aspiration into airway-silent on modified barium swallow 03/20/2013  . Melanoma     resected by [facility] 2008  . Skin cancer     Basal cell and squamous cell  . Esophageal dysmotility 10/09/2012   Past Surgical History  Procedure Laterality Date  . Total knee arthroplasty Right   . Esophagogastroduodenoscopy  04/10/2008  esophageal varices, cardia varix  . Tips procedure  2/10  . Back surgery      X5 "for ruptured disc; last time did a fusion" (06/21/2012)  . Excisional hemorrhoidectomy    . Release scar contracture / graft repairs of hand      bilaterally   . Elbow surgery Right   . Hiatal hernia repair    . Colonoscopy w/ polypectomy  02/14/2007    adenomatous polyps, diverticulosis, internal hemorrhoids/rectal varices  . Cataract extraction, bilateral  January 2013  . Tonsillectomy    . Appendectomy    . Joint replacement    . Coronary artery bypass graft  1993    6 Bypass  . Cataract extraction w/ intraocular lens  implant, bilateral     Family History  Problem Relation Age of Onset  . Alzheimer's disease Mother   . Cancer Father     GI (stomach?)  . Heart disease Sister     2 sisters  . Liver disease Sister   .  Diabetes Brother   . Heart disease Brother     3 brothers  . Liver disease Brother   . Heart attack Other     sibling  . Melanoma Other     sibling  . Stroke Other     sibling  . Colon cancer Neg Hx    History  Substance Use Topics  . Smoking status: Former Smoker -- 2.00 packs/day for 20 years    Types: Cigarettes    Quit date: 02/22/1975  . Smokeless tobacco: Never Used  . Alcohol Use: No     Comment: 06/21/2012 "none since Feb 2010"    Review of Systems  Cardiovascular: Positive for syncope.  All other systems reviewed and are negative.     Allergies  Shellfish-derived products; Doxycycline hyclate; Amoxicillin-pot clavulanate; Aspirin; Buprenorphine hcl-naloxone hcl; Codeine; Ezetimibe; Hydromorphone hcl; Meperidine hcl; Morphine; Morphine sulfate; Oxycodone; and Sulfonamide derivatives  Home Medications   Prior to Admission medications   Medication Sig Start Date End Date Taking? Authorizing Provider  acetaminophen (TYLENOL) 500 MG tablet Take 1,000 mg by mouth every 6 (six) hours as needed for mild pain.     Historical Provider, MD  allopurinol (ZYLOPRIM) 300 MG tablet Take 150 mg by mouth every morning.     Historical Provider, MD  amitriptyline (ELAVIL) 10 MG tablet Take 1 tablet (10 mg total) by mouth at bedtime. 07/26/13   Hendricks Limes, MD  Ascorbic Acid (VITAMIN C) 500 MG tablet Take 500 mg by mouth daily.      Historical Provider, MD  Calcium-Magnesium-Zinc (CALCIUM/MAGNESIUM/ZINC FORMULA) 1000-500-50 MG TABS Take 1 tablet by mouth every morning.     Historical Provider, MD  cephALEXin (KEFLEX) 500 MG capsule Take 1 capsule (500 mg total) by mouth 4 (four) times daily. 07/22/13   Campbell Riches, MD  fentaNYL (DURAGESIC - DOSED MCG/HR) 100 MCG/HR Place 100 mcg onto the skin every 3 (three) days.    Historical Provider, MD  Ferrous Gluconate (IRON) 240 (27 FE) MG TABS Take 1 tablet by mouth 2 (two) times daily.     Historical Provider, MD  furosemide (LASIX) 40  MG tablet Take 1 tablet (40 mg total) by mouth 2 (two) times daily. 03/29/13   Gatha Mayer, MD  gabapentin (NEURONTIN) 300 MG capsule TAKE ONE CAPSULE BY MOUTH THREE TIMES DAILY    Hendricks Limes, MD  glucose blood (FREESTYLE LITE) test strip 1 each by Other route 2 (two) times daily.  And lancets: Dx code 250.01 09/21/12   Renato Shin, MD  HYDROcodone-acetaminophen (NORCO/VICODIN) 5-325 MG per tablet Take 1-2 tablets by mouth every 4 (four) hours as needed for moderate pain. 07/11/13   Concha Norway, MD  hyoscyamine (LEVSIN SL) 0.125 MG SL tablet Place 1 tablet (0.125 mg total) under the tongue every 4 (four) hours as needed. 07/10/13   Gatha Mayer, MD  insulin aspart (NOVOLOG) 100 UNIT/ML injection Inject 10-40 Units into the skin 3 (three) times daily before meals. 25 units with breakfast, 15 units with lunch and 45 units with the evening meal 09/06/12   Renato Shin, MD  Insulin Syringe-Needle U-100 (INSULIN SYRINGE .5CC/31GX5/16") 31G X 5/16" 0.5 ML MISC Use one syringe 4 times daily. Dx code 250.01 01/22/13   Renato Shin, MD  isosorbide mononitrate (IMDUR) 60 MG 24 hr tablet TAKE ONE TABLET BY MOUTH ONCE DAILY 07/01/13   Carlena Bjornstad, MD  lactulose (CHRONULAC) 10 GM/15ML solution Take 30 mLs (20 g total) by mouth 2 (two) times daily. 03/29/13   Gatha Mayer, MD  loratadine (CLARITIN) 10 MG tablet Take 10 mg by mouth every morning.     Historical Provider, MD  metoprolol tartrate (LOPRESSOR) 25 MG tablet Take 0.5 tablets (12.5 mg total) by mouth 2 (two) times daily. 04/22/13   Hendricks Limes, MD  nitroGLYCERIN (NITROSTAT) 0.4 MG SL tablet Place 1 tablet (0.4 mg total) under the tongue every 5 (five) minutes as needed for chest pain. 05/27/12   Sheila Oats, MD  omeprazole (PRILOSEC OTC) 20 MG tablet Take 20 mg by mouth daily.    Historical Provider, MD  ondansetron (ZOFRAN) 8 MG tablet Take 1 tablet (8 mg total) by mouth every 12 (twelve) hours as needed for nausea or vomiting. 05/08/13   Concha Norway, MD  polyethylene glycol (MIRALAX / GLYCOLAX) packet Take 17 g by mouth daily as needed. Take two or three times a week    Historical Provider, MD  predniSONE (DELTASONE) 5 MG tablet Take 5 mg by mouth daily.     Historical Provider, MD  rifaximin (XIFAXAN) 550 MG TABS tablet Take 550 mg by mouth 2 (two) times daily.    Historical Provider, MD  saccharomyces boulardii (FLORASTOR) 250 MG capsule Take 1 capsule (250 mg total) by mouth 2 (two) times daily. 05/14/13   Willia Craze, NP  spironolactone (ALDACTONE) 50 MG tablet Take 1 tablet (50 mg total) by mouth daily. 03/29/13   Gatha Mayer, MD  Thiamine HCl (VITAMIN B-1) 250 MG tablet Take 125 mg by mouth daily.     Historical Provider, MD   BP 117/76  Pulse 56  Temp(Src) 97.9 F (36.6 C) (Rectal)  Resp 20  SpO2 98% Physical Exam  Nursing note and vitals reviewed.  78 year old male, resting comfortably and in no acute distress. Vital signs are significant for borderline bradycardia with heart rate 56. Oxygen saturation is 98%, which is normal. Head is normocephalic and atraumatic. PERRLA, EOMI. Oropharynx is clear. Neck is nontender and supple without adenopathy or JVD. Back is nontender and there is no CVA tenderness. Lungs are clear without rales, wheezes, or rhonchi. Chest is nontender. Heart has regular rate and rhythm without murmur. Abdomen is soft, flat, with mild tenderness in the epigastric area. There is no rebound or guarding. There are no masses or hepatosplenomegaly and peristalsis is normoactive. Extremities : There is swelling of the right leg with 3+ edema and. There is very mild erythema  present. Scars from prior saphenous vein harvesting are present. Venous stasis changes are also present. The left leg has trace edema and mild venous stasis changes.. Skin is warm and dry without rash. Neurologic: Mental status: He is awake and oriented but slow to respond with slightly slurred speech. Family states that this is a  normal for him. Cranial nerves are intact, there are no motor or sensory deficits.  ED Course  Procedures (including critical care time) Labs Review Results for orders placed during the hospital encounter of 08/02/13  CBC WITH DIFFERENTIAL      Result Value Ref Range   WBC 4.1  4.0 - 10.5 K/uL   RBC 3.50 (*) 4.22 - 5.81 MIL/uL   Hemoglobin 11.5 (*) 13.0 - 17.0 g/dL   HCT 35.0 (*) 39.0 - 52.0 %   MCV 100.0  78.0 - 100.0 fL   MCH 32.9  26.0 - 34.0 pg   MCHC 32.9  30.0 - 36.0 g/dL   RDW 17.3 (*) 11.5 - 15.5 %   Platelets 49 (*) 150 - 400 K/uL   Neutrophils Relative % 73  43 - 77 %   Neutro Abs 2.9  1.7 - 7.7 K/uL   Lymphocytes Relative 17  12 - 46 %   Lymphs Abs 0.7  0.7 - 4.0 K/uL   Monocytes Relative 10  3 - 12 %   Monocytes Absolute 0.4  0.1 - 1.0 K/uL   Eosinophils Relative 0  0 - 5 %   Eosinophils Absolute 0.0  0.0 - 0.7 K/uL   Basophils Relative 0  0 - 1 %   Basophils Absolute 0.0  0.0 - 0.1 K/uL  COMPREHENSIVE METABOLIC PANEL      Result Value Ref Range   Sodium 142  137 - 147 mEq/L   Potassium 3.6 (*) 3.7 - 5.3 mEq/L   Chloride 100  96 - 112 mEq/L   CO2 29  19 - 32 mEq/L   Glucose, Bld 104 (*) 70 - 99 mg/dL   BUN 20  6 - 23 mg/dL   Creatinine, Ser 1.36 (*) 0.50 - 1.35 mg/dL   Calcium 9.2  8.4 - 10.5 mg/dL   Total Protein 6.0  6.0 - 8.3 g/dL   Albumin 3.0 (*) 3.5 - 5.2 g/dL   AST 79 (*) 0 - 37 U/L   ALT 39  0 - 53 U/L   Alkaline Phosphatase 226 (*) 39 - 117 U/L   Total Bilirubin 2.2 (*) 0.3 - 1.2 mg/dL   GFR calc non Af Amer 49 (*) >90 mL/min   GFR calc Af Amer 57 (*) >90 mL/min  PRO B NATRIURETIC PEPTIDE      Result Value Ref Range   Pro B Natriuretic peptide (BNP) 795.6 (*) 0 - 450 pg/mL  AMMONIA      Result Value Ref Range   Ammonia 71 (*) 11 - 60 umol/L  CBG MONITORING, ED      Result Value Ref Range   Glucose-Capillary 108 (*) 70 - 99 mg/dL  I-STAT TROPOININ, ED      Result Value Ref Range   Troponin i, poc 0.01  0.00 - 0.08 ng/mL   Comment 3            I-STAT CG4 LACTIC ACID, ED      Result Value Ref Range   Lactic Acid, Venous 1.77  0.5 - 2.2 mmol/L  CBG MONITORING, ED      Result Value Ref Range   Glucose-Capillary  118 (*) 70 - 99 mg/dL   Imaging Review Dg Shoulder Right  08/02/2013   CLINICAL DATA:  Fall, pain.  EXAM: RIGHT SHOULDER - 2+ VIEW  COMPARISON:  None.  FINDINGS: No acute fracture or dislocation. The humeral head is normally aligned within the glenoid. AC joint is approximated. Subarticular spurring noted at the [facility] joint. No periarticular calcification. No soft tissue abnormality.  Osseous mineralization is normal.  IMPRESSION: No acute fracture or dislocation.   Electronically Signed   By: Jeannine Boga M.D.   On: 08/02/2013 06:28   Dg Chest Portable 1 View  08/02/2013   CLINICAL DATA:  Shortness of breath and loss of consciousness tonight. Altered mental status.  EXAM: PORTABLE CHEST - 1 VIEW  COMPARISON:  CT chest 06/28/2013.  Chest 06/21/2012.  FINDINGS: Postoperative changes in the mediastinum. Shallow inspiration. Cardiac enlargement with prominent pulmonary vascularity suggesting mild congestion. No edema or infiltration. No blunting of costophrenic angles. No pneumothorax. Calcification of the aorta.  IMPRESSION: Cardiac enlargement with mild pulmonary vascular congestion. No edema or infiltration.   Electronically Signed   By: Lucienne Capers M.D.   On: 08/02/2013 03:26     EKG Interpretation   Date/Time:  Friday August 02 2013 02:39:18 EDT Ventricular Rate:  59 PR Interval:  205 QRS Duration: 105 QT Interval:  441 QTC Calculation: 437 R Axis:   3 Text Interpretation:  Sinus rhythm Abnormal R-wave progression, early  transition Probable anteroseptal infarct, old When compared with ECG of  9/16/23014, No significant change was found Confirmed by [facility]  MD, Johnnie Moten  ([id number]) on 08/02/2013 3:01:38 AM      MDM   Final diagnoses:  Altered mental status  Serum ammonia increased  Hypoglycemia  Anemia  Renal  insufficiency    Syncopal episode which sounds with a static. Mild hypoglycemia. Patient with history of cirrhosis and hepatic encephalopathy. Orthostatic vital signs will be checked and ammonia level checked. Old records are reviewed and he has been treated for cellulitis of his right leg with recent hospitalizations for same. Recent CT scan showed no evidence of abdominal aneurysm.  Ammonia has come back mildly elevated. It is also noted to have slightly worse renal function than baseline. And discuss with family and perhaps he actually has had slight deterioration in mental status over the last several days. He is now complaining of pain in his right shoulder from when he fell and x-rays are obtained and are unremarkable. Bilirubin, AST, and alkaline phosphatase have all increased compared with baseline. He does felt that he should be admitted for optimizing his medical regimen. Case is discussed with Dr. Hal Hope of [facility] who agrees to admit the patient.   Delora Fuel, MD 41/74/08 1448

## 2013-08-02 NOTE — ED Notes (Signed)
Patients wife states his abdomen is larger then usual.  Patient moaning out loud with pain and holding his abdomen  Unable to void at this time

## 2013-08-02 NOTE — ED Notes (Signed)
CBG Taken = 118

## 2013-08-02 NOTE — ED Notes (Signed)
In and out cath done per protocol.  Patient tolerated procedure well

## 2013-08-02 NOTE — ED Notes (Signed)
Dr Raliegh Ip into see patient and wife.

## 2013-08-02 NOTE — H&P (Signed)
[facility] History and Physical  [name] WIO:[id number] DOB: 1935/07/16 DOA: 08/02/2013  Referring physician: ER physician. PCP: Unice Cobble, MD  Specialists: Dr. Carlean Purl. Gastroenterologist.  Chief Complaint: Fall with confusion.  HPI: [name] is a 78 y.o. male history of hepatocellular carcinoma status post radiation seeding, cirrhosis of liver, diabetes mellitus, OSA on CPAP early morning around 2 AM felt weak and on recheck his sugar went he walked towards his kitchen and fell. EMS was called and pressure was found to be around 61. Patient was found to be weak and was brought to the ER. In the ER lab works revealed ammonia was around 71 and his blood sugar has increased to 118. Patient is complaining of intense pain in his right shoulder abdomen. Patient's wife states that he looks more confused than what is usually is and his abdomen is more distended than usual. On exam patient has tenderness on his both upper quadrants of his abdomen and his right shoulder pain is improved. Patient is lethargic. He was recently started on antibiotics for his right lower extremities cellulitis. That has improved. Patient otherwise did not complain of any chest pain nausea vomiting diarrhea shortness of breath.   Review of Systems: As presented in the history of presenting illness, rest negative.  Past Medical History  Diagnosis Date  . Diverticulosis   . Gastric antral vascular ectasia   . Rhinophyma   . CAD (coronary artery disease)     myoview 2007 no ischemia/ no ASA because of GI disease  . Atrial fibrillation     amiodarone in past, but problems and left off meds.  . Aortic stenosis     echo, September, 2010, mild aortic insufficiency  . Visual loss, one eye     right eye-ophthalmic arterial branch occlusion  . Internal hemorrhoids   . GERD (gastroesophageal reflux disease)   . Hypertension   . Allergic rhinitis   . Anemia     multifactorial  . Cirrhosis, alcoholic   .  Thrombocytopenia   . Bleeding esophageal varices   . Encephalopathy, unspecified   . Gout   . Short-segment Barrett's esophagus     suspected  . Venous insufficiency   . Ejection fraction     EF 55%, echo, September, 2010  . Carotid bruit     Dopplers okay in the past  . Cellulitis     recurrent to RLE/notes 06/21/2012  . CHF (congestive heart failure)   . Sinus bradycardia     January, 2014  . Diastolic CHF     January, 2014  . Complication of anesthesia     "quit breathing once a long time ago; dropped my BP too; it was after I was given Dilaudid" (06/21/2012)  . Heart murmur   . Anginal pain   . Shortness of breath     "stay that way most of the time now" (06/21/2012)  . OSA on CPAP   . Diabetes mellitus, type 2   . History of blood transfusion     "not related to OR" (06/21/2012)  . H/O hiatal hernia   . Osteoarthritis     "ALL OVER" (06/21/2012)  . Chronic lower back pain   . Aspiration into airway-silent on modified barium swallow 03/20/2013  . Melanoma     resected by [facility] 2008  . Skin cancer     Basal cell and squamous cell  . Esophageal dysmotility 10/09/2012   Past Surgical History  Procedure Laterality Date  . Total  knee arthroplasty Right   . Esophagogastroduodenoscopy  04/10/2008    esophageal varices, cardia varix  . Tips procedure  2/10  . Back surgery      X5 "for ruptured disc; last time did a fusion" (06/21/2012)  . Excisional hemorrhoidectomy    . Release scar contracture / graft repairs of hand      bilaterally   . Elbow surgery Right   . Hiatal hernia repair    . Colonoscopy w/ polypectomy  02/14/2007    adenomatous polyps, diverticulosis, internal hemorrhoids/rectal varices  . Cataract extraction, bilateral  January 2013  . Tonsillectomy    . Appendectomy    . Joint replacement    . Coronary artery bypass graft  1993    6 Bypass  . Cataract extraction w/ intraocular lens  implant, bilateral     Social History:  reports that he quit smoking about  38 years ago. His smoking use included Cigarettes. He has a 40 pack-year smoking history. He has never used smokeless tobacco. He reports that he does not drink alcohol or use illicit drugs. Where does patient live home. Can patient participate in ADLs? Yes.  Allergies  Allergen Reactions  . Shellfish-Derived Products     Rash & angioedema  . Doxycycline Hyclate     Diarrhea   . Amoxicillin-Pot Clavulanate     Unsure what reaction was but pt tolerated penicillin 05/25/2012  . Aspirin     REACTION: unable to tolerate due to cirrhosis and varices  . Buprenorphine Hcl-Naloxone Hcl Other (See Comments)    unresponsive  . Codeine     Rash    . Ezetimibe     REACTION: LEG/JOINT PAIN  . Hydromorphone Hcl     unknown  . Meperidine Hcl     unknown  . Morphine     unknown  . Morphine Sulfate     REACTION: unspecified  . Oxycodone     hallucinations  . Sulfonamide Derivatives Nausea And Vomiting    Family History:  Family History  Problem Relation Age of Onset  . Alzheimer's disease Mother   . Cancer Father     GI (stomach?)  . Heart disease Sister     2 sisters  . Liver disease Sister   . Diabetes Brother   . Heart disease Brother     3 brothers  . Liver disease Brother   . Heart attack Other     sibling  . Melanoma Other     sibling  . Stroke Other     sibling  . Colon cancer Neg Hx       Prior to Admission medications   Medication Sig Start Date End Date Taking? Authorizing Provider  acetaminophen (TYLENOL) 500 MG tablet Take 1,000 mg by mouth every 6 (six) hours as needed for mild pain.    Yes Historical Provider, MD  allopurinol (ZYLOPRIM) 300 MG tablet Take 150 mg by mouth every morning.    Yes Historical Provider, MD  amitriptyline (ELAVIL) 10 MG tablet Take 1 tablet (10 mg total) by mouth at bedtime. 07/26/13  Yes Hendricks Limes, MD  Ascorbic Acid (VITAMIN C) 500 MG tablet Take 500 mg by mouth daily.     Yes Historical Provider, MD  Calcium-Magnesium-Zinc  (CALCIUM/MAGNESIUM/ZINC FORMULA) 1000-500-50 MG TABS Take 1 tablet by mouth every morning.    Yes Historical Provider, MD  cephALEXin (KEFLEX) 500 MG capsule Take 1 capsule (500 mg total) by mouth 4 (four) times daily. 07/22/13  Yes Dellis Filbert  Chevis Pretty, MD  fentaNYL (DURAGESIC - DOSED MCG/HR) 100 MCG/HR Place 100 mcg onto the skin every 3 (three) days.   Yes Historical Provider, MD  Ferrous Gluconate (IRON) 240 (27 FE) MG TABS Take 1 tablet by mouth 2 (two) times daily.    Yes Historical Provider, MD  furosemide (LASIX) 40 MG tablet Take 1 tablet (40 mg total) by mouth 2 (two) times daily. 03/29/13  Yes Gatha Mayer, MD  gabapentin (NEURONTIN) 300 MG capsule Take 300 mg by mouth 3 (three) times daily.   Yes Historical Provider, MD  HYDROcodone-acetaminophen (NORCO/VICODIN) 5-325 MG per tablet Take 1-2 tablets by mouth every 4 (four) hours as needed for moderate pain. 07/11/13  Yes Concha Norway, MD  hyoscyamine (ANASPAZ) 0.125 MG TBDP disintergrating tablet Place 0.125 mg under the tongue every 4 (four) hours as needed for bladder spasms or cramping.    Yes Historical Provider, MD  insulin aspart (NOVOLOG) 100 UNIT/ML injection Inject 10-40 Units into the skin 3 (three) times daily before meals. 25 units with breakfast, 15 units with lunch and 45 units with the evening meal 09/06/12  Yes Renato Shin, MD  isosorbide mononitrate (IMDUR) 60 MG 24 hr tablet Take 60 mg by mouth daily.   Yes Historical Provider, MD  lactulose (CHRONULAC) 10 GM/15ML solution Take 30 mLs (20 g total) by mouth 2 (two) times daily. 03/29/13  Yes Gatha Mayer, MD  loratadine (CLARITIN) 10 MG tablet Take 10 mg by mouth every morning.    Yes Historical Provider, MD  metoprolol tartrate (LOPRESSOR) 25 MG tablet Take 0.5 tablets (12.5 mg total) by mouth 2 (two) times daily. 04/22/13  Yes Hendricks Limes, MD  nitroGLYCERIN (NITROSTAT) 0.4 MG SL tablet Place 1 tablet (0.4 mg total) under the tongue every 5 (five) minutes as needed for chest  pain. 05/27/12  Yes Adeline Saralyn Pilar, MD  omeprazole (PRILOSEC OTC) 20 MG tablet Take 20 mg by mouth daily.   Yes Historical Provider, MD  ondansetron (ZOFRAN) 8 MG tablet Take 1 tablet (8 mg total) by mouth every 12 (twelve) hours as needed for nausea or vomiting. 05/08/13  Yes Concha Norway, MD  polyethylene glycol (MIRALAX / GLYCOLAX) packet Take 17 g by mouth daily as needed. Take two or three times a week   Yes Historical Provider, MD  predniSONE (DELTASONE) 5 MG tablet Take 5 mg by mouth daily.    Yes Historical Provider, MD  rifaximin (XIFAXAN) 550 MG TABS tablet Take 550 mg by mouth 2 (two) times daily.   Yes Historical Provider, MD  spironolactone (ALDACTONE) 50 MG tablet Take 1 tablet (50 mg total) by mouth daily. 03/29/13  Yes Gatha Mayer, MD  Thiamine HCl (VITAMIN B-1) 250 MG tablet Take 125 mg by mouth daily.    Yes Historical Provider, MD  triamcinolone cream (KENALOG) 0.1 % Apply 1 application topically daily.   Yes Historical Provider, MD    Physical Exam: Filed Vitals:   08/02/13 0515 08/02/13 0530 08/02/13 0545 08/02/13 0600  BP: 122/59 128/64 131/58 118/52  Pulse: 58 58 59 57  Temp:      TempSrc:      Resp: 21 14 15 12   SpO2: 94% 98% 99% 99%     General:  Well-developed and nourished.  Eyes: Anicteric no pallor.  ENT: No discharge from the ears eyes nose mouth.  Neck: No mass felt.  Cardiovascular: S1-S2 heard.  Respiratory: No rhonchi or crepitations.  Abdomen: Abdomen distended mildly tender in the  upper quadrants.  Skin: Right lower extremity has mild erythema and chronic skin changes.  Musculoskeletal: Bilateral lower extremity edema more on the right side.  Psychiatric: Patient is lethargic.  Neurologic: Patient is lethargic but follows commands and moves all extremities.  Labs on Admission:  Basic Metabolic Panel:  Recent Labs Lab 08/02/13 0245  NA 142  K 3.6*  CL 100  CO2 29  GLUCOSE 104*  BUN 20  CREATININE 1.36*  CALCIUM 9.2   Liver  Function Tests:  Recent Labs Lab 08/02/13 0245  AST 79*  ALT 39  ALKPHOS 226*  BILITOT 2.2*  PROT 6.0  ALBUMIN 3.0*   No results found for this basename: LIPASE, AMYLASE,  in the last 168 hours  Recent Labs Lab 08/02/13 0310  AMMONIA 71*   CBC:  Recent Labs Lab 08/02/13 0245  WBC 4.1  NEUTROABS 2.9  HGB 11.5*  HCT 35.0*  MCV 100.0  PLT 49*   Cardiac Enzymes: No results found for this basename: CKTOTAL, CKMB, CKMBINDEX, TROPONINI,  in the last 168 hours  BNP (last 3 results)  Recent Labs  08/02/13 0245  PROBNP 795.6*   CBG:  Recent Labs Lab 08/02/13 0234 08/02/13 0412  GLUCAP 108* 118*    Radiological Exams on Admission: Dg Shoulder Right  08/02/2013   CLINICAL DATA:  Fall, pain.  EXAM: RIGHT SHOULDER - 2+ VIEW  COMPARISON:  None.  FINDINGS: No acute fracture or dislocation. The humeral head is normally aligned within the glenoid. AC joint is approximated. Subarticular spurring noted at the [facility] joint. No periarticular calcification. No soft tissue abnormality.  Osseous mineralization is normal.  IMPRESSION: No acute fracture or dislocation.   Electronically Signed   By: Jeannine Boga M.D.   On: 08/02/2013 06:28   Dg Chest Portable 1 View  08/02/2013   CLINICAL DATA:  Shortness of breath and loss of consciousness tonight. Altered mental status.  EXAM: PORTABLE CHEST - 1 VIEW  COMPARISON:  CT chest 06/28/2013.  Chest 06/21/2012.  FINDINGS: Postoperative changes in the mediastinum. Shallow inspiration. Cardiac enlargement with prominent pulmonary vascularity suggesting mild congestion. No edema or infiltration. No blunting of costophrenic angles. No pneumothorax. Calcification of the aorta.  IMPRESSION: Cardiac enlargement with mild pulmonary vascular congestion. No edema or infiltration.   Electronically Signed   By: Lucienne Capers M.D.   On: 08/02/2013 03:26    EKG: Independently reviewed. Normal sinus rhythm with ST-T changes comparable to old  EKG.  Assessment/Plan Principal Problem:   Encephalopathy, hepatic Active Problems:   Hepatic encephalopathy   CAD (coronary artery disease)   DM2 (diabetes mellitus, type 2)   Abdominal pain   Hypoglycemia   Altered mental status   1. Acute encephalopathy most likely secondary to hepatic encephalopathy and hyperglycemia - I have increased patient's lactulose dose to 30 g 3 times a day. For now I have only place patient on sliding-scale coverage insulin. Closely follow CBGs. Follow ammonia levels. Since patient is complaining of abdominal pain I have ordered CT abdomen to check for any hematoma. If it does show significant ascites then will need paracentesis. For now I have placed patient on empiric coverage for SBP. Since patient fell and hit his head I have ordered CT head.  2. Abdominal pain - patient is complaining of significant abdominal pain and patient's wife feels that his abdomen is distended from usual. CT abdomen and pelvis is pending. 3. Cirrhosis of liver - continue diuretics. 4. Diabetes mellitus - see #1. 5. CAD  status post CABG - denies any chest pain. 6. Hepatocellular carcinoma - per Dr. Carlean Purl. 7. History of gout - on prednisone. 8. Chronic anemia and thrombocytopenia - follow CBC. 9. Chronic kidney disease stage III - closely follow intake output.  CT abdomen pelvis and head is pending. I have consulted hospice.    Code Status: DO NOT RESUSCITATE.  Family Communication: Patient's wife.  Disposition Plan: Admit to inpatient.    Shauntel Prest N. [facility] Pager [phone].  If 7PM-7AM, please contact night-coverage [email or web address] Password [email or web address] 08/02/2013, 7:02 AM

## 2013-08-02 NOTE — Progress Notes (Signed)
Utilization review completed.  

## 2013-08-02 NOTE — ED Notes (Signed)
Urine collected = 575

## 2013-08-02 NOTE — Progress Notes (Signed)
Chart reviewed. Patient examined. Patient admitted after midnight.  Still groggy. Has not voided. Required in and out catheterization in the ER. Will place Foley catheter and start Flomax.  Also has not had a bowel movement. X-ray and CAT scan shows large stool burden. Will increase lactulose. Still groggy. Hold all sedating medications for now. Does have ascites. Will order ultrasound-guided paracentesis for cell count, Gram stain and culture.  Start clear liquids.  Doree Barthel, MD [facility] [phone]

## 2013-08-03 ENCOUNTER — Inpatient Hospital Stay (HOSPITAL_COMMUNITY): Payer: Medicare Other

## 2013-08-03 DIAGNOSIS — E1159 Type 2 diabetes mellitus with other circulatory complications: Secondary | ICD-10-CM

## 2013-08-03 LAB — BASIC METABOLIC PANEL
BUN: 18 mg/dL (ref 6–23)
CALCIUM: 9 mg/dL (ref 8.4–10.5)
CO2: 29 mEq/L (ref 19–32)
CREATININE: 1.05 mg/dL (ref 0.50–1.35)
Chloride: 98 mEq/L (ref 96–112)
GFR calc non Af Amer: 67 mL/min — ABNORMAL LOW (ref >90)
GFR, EST AFRICAN AMERICAN: 78 mL/min — AB (ref >90)
Glucose, Bld: 223 mg/dL — ABNORMAL HIGH (ref 70–99)
Potassium: 4.1 mEq/L (ref 3.7–5.3)
Sodium: 138 mEq/L (ref 137–147)

## 2013-08-03 LAB — BODY FLUID CELL COUNT WITH DIFFERENTIAL
Eos, Fluid: 0 %
Lymphs, Fluid: 43 %
Monocyte-Macrophage-Serous Fluid: 45 % — ABNORMAL LOW (ref 50–90)
Neutrophil Count, Fluid: 12 % (ref 0–25)
Total Nucleated Cell Count, Fluid: 212 cu mm (ref 0–1000)

## 2013-08-03 LAB — GLUCOSE, CAPILLARY
Glucose-Capillary: 173 mg/dL — ABNORMAL HIGH (ref 70–99)
Glucose-Capillary: 215 mg/dL — ABNORMAL HIGH (ref 70–99)
Glucose-Capillary: 267 mg/dL — ABNORMAL HIGH (ref 70–99)
Glucose-Capillary: 331 mg/dL — ABNORMAL HIGH (ref 70–99)

## 2013-08-03 LAB — CBC
HCT: 34.6 % — ABNORMAL LOW (ref 39.0–52.0)
Hemoglobin: 11.2 g/dL — ABNORMAL LOW (ref 13.0–17.0)
MCH: 32.8 pg (ref 26.0–34.0)
MCHC: 32.4 g/dL (ref 30.0–36.0)
MCV: 101.5 fL — ABNORMAL HIGH (ref 78.0–100.0)
PLATELETS: 37 10*3/uL — AB (ref 150–400)
RBC: 3.41 MIL/uL — ABNORMAL LOW (ref 4.22–5.81)
RDW: 16.7 % — AB (ref 11.5–15.5)
WBC: 3.4 10*3/uL — ABNORMAL LOW (ref 4.0–10.5)

## 2013-08-03 MED ORDER — BIOTENE DRY MOUTH MT LIQD
15.0000 mL | Freq: Two times a day (BID) | OROMUCOSAL | Status: DC
  Administered 2013-08-03 – 2013-08-05 (×4): 15 mL via OROMUCOSAL

## 2013-08-03 MED ORDER — INSULIN ASPART 100 UNIT/ML ~~LOC~~ SOLN
5.0000 [IU] | Freq: Once | SUBCUTANEOUS | Status: AC
  Administered 2013-08-03: 5 [IU] via SUBCUTANEOUS

## 2013-08-03 NOTE — Procedures (Signed)
US guided diagnostic/therapeutic paracentesis performed yielding 1.2 liters slightly turbid, yellow fluid. A portion of the fluid was sent to the lab for preordered studies. No immediate complications.

## 2013-08-03 NOTE — Progress Notes (Signed)
Patient [name]      DOB: 1935/03/04      ZGY:[id number]  Met with spouse and children and for few minutes with patient.  He had to go down for a procedure part way through.  Current wishes of spouse and family are to honor DNR, and treat the treatable including infection , cirrhosis, blood sugars, and feeding issues.  We started talking about the 'what ifs', they clearly understand that they have limited time with [name] and they want to be informed on how to make that the best.  His spouse has a lot of guilt and pre-guilt stating  "I don't want to miss making a diagnosis"..." I don't want to pull the plug" Family was able to express to her that they appreciate the care she takes of [name] and would never feel that she had "missed something." A full note with more detail regarding topics discussed will follow to assist on continuous goals of care as this is going to be a process.   [name] L. Lovena Le, MD MBA The Palliative Medicine Team at [facility] Phone: [phone] Pager: [phone]

## 2013-08-03 NOTE — Progress Notes (Signed)
PROGRESS NOTE  [name] TMH:[id number] DOB: [birth year] DOA: 08/02/2013 PCP: Unice Cobble, MD  Assessment/Plan:  hyperglycemia - sliding-scale coverage insulin. Closely follow CBGs.   Hepatic encephalopathy- Follow ammonia levels. Lactulose -ammonia better but patient still sleepy  Ascites- for paracentesis.  - empiric coverage for SBP.  -CT head.   Cirrhosis of liver - continue diuretics.   Diabetes mellitus - see above   CAD status post CABG - denies any chest pain.   Hepatocellular carcinoma - per Dr. Carlean Purl.   History of gout - on prednisone.   Chronic anemia and thrombocytopenia - follow CBC.   Chronic kidney disease stage III - closely follow intake output   Code Status: DNR Family Communication: wife at bedside Disposition Plan:    Consultants:  palliative care  Procedures:      HPI/Subjective: Per wife. Still sleepy  Objective: Filed Vitals:   08/03/13 0607  BP: 131/60  Pulse: 58  Temp: 97.6 F (36.4 C)  Resp: 16    Intake/Output Summary (Last 24 hours) at 08/03/13 1028 Last data filed at 08/03/13 1027  Gross per 24 hour  Intake    550 ml  Output   1300 ml  Net   -750 ml   Filed Weights   08/02/13 2019  Weight: 112.855 kg (248 lb 12.8 oz)    Exam:   General:  Smiling, NAD  Cardiovascular: rrr  Respiratory: clear  Abdomen: +BS, obese  Musculoskeletal: edema   Data Reviewed: Basic Metabolic Panel:  Recent Labs Lab 08/02/13 0245 08/02/13 1008 08/03/13 0458  NA 142 143 138  K 3.6* 4.6 4.1  CL 100 101 98  CO2 29 30 29   GLUCOSE 104* 166* 223*  BUN 20 20 18   CREATININE 1.36* 1.24 1.05  CALCIUM 9.2 9.2 9.0   Liver Function Tests:  Recent Labs Lab 08/02/13 0245 08/02/13 1008  AST 79* 72*  ALT 39 37  ALKPHOS 226* 217*  BILITOT 2.2* 2.4*  PROT 6.0 5.8*  ALBUMIN 3.0* 2.9*   No results found for this basename: LIPASE, AMYLASE,  in the last 168 hours  Recent Labs Lab 08/02/13 0310 08/02/13 1008    AMMONIA 71* 34   CBC:  Recent Labs Lab 08/02/13 0245 08/02/13 1008 08/03/13 0458  WBC 4.1 3.9* 3.4*  NEUTROABS 2.9 2.8  --   HGB 11.5* 11.0* 11.2*  HCT 35.0* 34.1* 34.6*  MCV 100.0 100.3* 101.5*  PLT 49* 39* 37*   Cardiac Enzymes: No results found for this basename: CKTOTAL, CKMB, CKMBINDEX, TROPONINI,  in the last 168 hours BNP (last 3 results)  Recent Labs  08/02/13 0245  PROBNP 795.6*   CBG:  Recent Labs Lab 08/02/13 0837 08/02/13 1152 08/02/13 1727 08/02/13 2128 08/03/13 0747  GLUCAP 141* 161* 217* 232* 173*    No results found for this or any previous visit (from the past 240 hour(s)).   Studies: Ct Abdomen Pelvis Wo Contrast  08/02/2013   CLINICAL DATA:  Fall with abdominal pain. History of cirrhosis with multifocal HCC post TIPS and Y-90 treatment.  EXAM: CT ABDOMEN AND PELVIS WITHOUT CONTRAST  TECHNIQUE: Multidetector CT imaging of the abdomen and pelvis was performed following the standard protocol without IV contrast.  COMPARISON:  MRI 07/18/2013 and CT 06/28/2013 as well as 03/21/2013  FINDINGS: Lung bases demonstrate a small amount of left pleural fluid with mild dependent bibasilar atelectasis. Sternotomy wires are present. There is calcification in the region of the mitral valve annulus. Is increased density over the region  of the aortic valve which may represent of prosthetic valve.  Abdominal images demonstrate a small nodular liver compatible with patient's known cirrhosis. There are 2 ill-defined hypodense masses over the film of the right lobe measuring 3 cm and 2.7 cm respectively without significant change. Remaining known liver lesions are better defined on the recent MRI. Patient's portosystemic shunt is unchanged. There is moderate cholelithiasis. The pancreas and adrenal glands are within normal. Kidneys are normal in size without hydronephrosis or nephrolithiasis. There is mild splenomegaly unchanged. There is evidence of mild ascites which is  slightly worse. There is no free peritoneal air. Appendix is normal. There is moderate diverticulosis of the colon. There is moderate calcified plaque involving the abdominal aorta and iliac arteries.  Pelvic images demonstrate a mild-to-moderate amount of free fluid with Hounsfield unit measurements of 18 likely related to patient's ascites secondary to liver disease and not secondary to trauma. Remaining pelvic structures are unremarkable.  There are moderate degenerative changes of the spine with mild degenerative change of the hips. There is no acute fracture.  IMPRESSION: No acute injury in the abdomen/pelvis.  Evidence of known cirrhosis with sequelae of portal hypertension with splenomegaly and ascites. There has been slight interval worsening of patient's ascites. Known multifocal hepatic cellular carcinoma without significant change and stable portosystemic shunt.  Small amount of left pleural fluid with bibasilar atelectatic change.  Diverticulosis of the colon.  Moderate cholelithiasis.   Electronically Signed   By: Marin Olp M.D.   On: 08/02/2013 08:20   Dg Shoulder Right  08/02/2013   CLINICAL DATA:  Fall, pain.  EXAM: RIGHT SHOULDER - 2+ VIEW  COMPARISON:  None.  FINDINGS: No acute fracture or dislocation. The humeral head is normally aligned within the glenoid. AC joint is approximated. Subarticular spurring noted at the [facility] joint. No periarticular calcification. No soft tissue abnormality.  Osseous mineralization is normal.  IMPRESSION: No acute fracture or dislocation.   Electronically Signed   By: Jeannine Boga M.D.   On: 08/02/2013 06:28   Ct Head Wo Contrast  08/02/2013   CLINICAL DATA:  Lethargy  EXAM: CT HEAD WITHOUT CONTRAST  TECHNIQUE: Contiguous axial images were obtained from the base of the skull through the vertex without intravenous contrast.  COMPARISON:  04/17/2010  FINDINGS: Mild atrophic changes are seen. Mild chronic white matter ischemic change is again identified.  No findings to suggest acute hemorrhage, acute infarction or space-occupying mass lesion are noted. The bony calvarium is intact.  IMPRESSION: Chronic changes without acute abnormality.   Electronically Signed   By: Inez Catalina M.D.   On: 08/02/2013 08:03   Dg Chest Portable 1 View  08/02/2013   CLINICAL DATA:  Shortness of breath and loss of consciousness tonight. Altered mental status.  EXAM: PORTABLE CHEST - 1 VIEW  COMPARISON:  CT chest 06/28/2013.  Chest 06/21/2012.  FINDINGS: Postoperative changes in the mediastinum. Shallow inspiration. Cardiac enlargement with prominent pulmonary vascularity suggesting mild congestion. No edema or infiltration. No blunting of costophrenic angles. No pneumothorax. Calcification of the aorta.  IMPRESSION: Cardiac enlargement with mild pulmonary vascular congestion. No edema or infiltration.   Electronically Signed   By: Lucienne Capers M.D.   On: 08/02/2013 03:26   Dg Abd 2 Views  08/02/2013   CLINICAL DATA:  Status post small earlier today with loss of consciousness ; epigastric pain; history of previous appendectomy  EXAM: ABDOMEN - 2 VIEW  COMPARISON:  CT scan of the abdomen and pelvis of today's  date  FINDINGS: There is a moderate burden throughout the colon. Are small amounts of small bowel gas. No free extraluminal gas collections. There is a TIPS appliance present in the liver. There is surgical wire suture to the right of the lumbar spine. There are degenerative changes of the lumbar spine.  IMPRESSION: Increased stool burden is consistent with constipation. No obstruction or perforation is demonstrated.   Electronically Signed   By: David  Martinique   On: 08/02/2013 08:06    Scheduled Meds: . allopurinol  150 mg Oral q morning - 10a  . antiseptic oral rinse  15 mL Mouth Rinse BID  . cefoTAXime (CLAFORAN) IV  1 g Intravenous Q8H  . ferrous sulfate  325 mg Oral BID  . furosemide  40 mg Oral BID  . insulin aspart  0-9 Units Subcutaneous TID WC  .  isosorbide mononitrate  60 mg Oral Daily  . lactulose  30 g Oral TID WC & HS  . loratadine  10 mg Oral q morning - 10a  . metoprolol tartrate  12.5 mg Oral BID  . pantoprazole  40 mg Oral Daily  . predniSONE  5 mg Oral Daily  . rifaximin  550 mg Oral BID  . sodium chloride  3 mL Intravenous Q12H  . spironolactone  50 mg Oral Daily  . tamsulosin  0.4 mg Oral Daily  . vitamin B-1  125 mg Oral Daily  . triamcinolone cream  1 application Topical Daily  . vitamin C  500 mg Oral Daily   Continuous Infusions:  Antibiotics Given (last 72 hours)   Date/Time Action Medication Dose Rate   08/02/13 1118 Given   rifaximin (XIFAXAN) tablet 550 mg 550 mg    08/02/13 1334 Given   cefoTAXime (CLAFORAN) 1 g in dextrose 5 % 50 mL IVPB 1 g 100 mL/hr   08/02/13 2016 Given   cefoTAXime (CLAFORAN) 1 g in dextrose 5 % 50 mL IVPB 1 g 100 mL/hr   08/02/13 2258 Given   rifaximin (XIFAXAN) tablet 550 mg 550 mg    08/03/13 0418 Given   cefoTAXime (CLAFORAN) 1 g in dextrose 5 % 50 mL IVPB 1 g 100 mL/hr      Principal Problem:   Encephalopathy, hepatic Active Problems:   Hepatic encephalopathy   CAD (coronary artery disease)   DM2 (diabetes mellitus, type 2)   Abdominal pain   Hypoglycemia   Altered mental status   Palliative care encounter   Weakness generalized    Time spent: 35 min    Hersel Mcmeen, [place] [facility] Pager [phone]. If 7PM-7AM, please contact night-coverage at [email or web address], password [facility] 08/03/2013, 10:28 AM  LOS: 1 day

## 2013-08-03 NOTE — Consult Note (Signed)
I have reviewed this case with our NP and agree with the Assessment and Plan as stated.  Chelcy Bolda L. Graylen Noboa, MD MBA The Palliative Medicine Team at [place] Team Phone: [phone] Pager: [phone]   

## 2013-08-04 ENCOUNTER — Inpatient Hospital Stay (HOSPITAL_COMMUNITY): Payer: Medicare Other

## 2013-08-04 DIAGNOSIS — K7030 Alcoholic cirrhosis of liver without ascites: Secondary | ICD-10-CM

## 2013-08-04 DIAGNOSIS — E119 Type 2 diabetes mellitus without complications: Secondary | ICD-10-CM

## 2013-08-04 LAB — BASIC METABOLIC PANEL
BUN: 19 mg/dL (ref 6–23)
CALCIUM: 8.8 mg/dL (ref 8.4–10.5)
CO2: 33 mEq/L — ABNORMAL HIGH (ref 19–32)
Chloride: 99 mEq/L (ref 96–112)
Creatinine, Ser: 1.07 mg/dL (ref 0.50–1.35)
GFR calc Af Amer: 76 mL/min — ABNORMAL LOW (ref >90)
GFR, EST NON AFRICAN AMERICAN: 66 mL/min — AB (ref >90)
GLUCOSE: 207 mg/dL — AB (ref 70–99)
Potassium: 4.1 mEq/L (ref 3.7–5.3)
Sodium: 140 mEq/L (ref 137–147)

## 2013-08-04 LAB — CBC
HEMATOCRIT: 33.3 % — AB (ref 39.0–52.0)
Hemoglobin: 11 g/dL — ABNORMAL LOW (ref 13.0–17.0)
MCH: 33.6 pg (ref 26.0–34.0)
MCHC: 33 g/dL (ref 30.0–36.0)
MCV: 101.8 fL — ABNORMAL HIGH (ref 78.0–100.0)
Platelets: 31 10*3/uL — ABNORMAL LOW (ref 150–400)
RBC: 3.27 MIL/uL — ABNORMAL LOW (ref 4.22–5.81)
RDW: 16.4 % — ABNORMAL HIGH (ref 11.5–15.5)
WBC: 2.8 10*3/uL — ABNORMAL LOW (ref 4.0–10.5)

## 2013-08-04 LAB — GLUCOSE, CAPILLARY
GLUCOSE-CAPILLARY: 250 mg/dL — AB (ref 70–99)
GLUCOSE-CAPILLARY: 108 mg/dL — AB (ref 70–99)
Glucose-Capillary: 196 mg/dL — ABNORMAL HIGH (ref 70–99)
Glucose-Capillary: 229 mg/dL — ABNORMAL HIGH (ref 70–99)

## 2013-08-04 MED ORDER — ONDANSETRON HCL 4 MG/2ML IJ SOLN
4.0000 mg | Freq: Once | INTRAMUSCULAR | Status: AC
  Administered 2013-08-04: 4 mg via INTRAVENOUS
  Filled 2013-08-04: qty 2

## 2013-08-04 MED ORDER — IOHEXOL 300 MG/ML  SOLN
20.0000 mL | INTRAMUSCULAR | Status: AC
  Administered 2013-08-04: 20 mL via ORAL

## 2013-08-04 MED ORDER — INSULIN ASPART 100 UNIT/ML ~~LOC~~ SOLN
0.0000 [IU] | Freq: Every day | SUBCUTANEOUS | Status: DC
Start: 2013-08-04 — End: 2013-08-06

## 2013-08-04 MED ORDER — HYDROCODONE-ACETAMINOPHEN 5-325 MG PO TABS
1.0000 | ORAL_TABLET | ORAL | Status: DC | PRN
  Administered 2013-08-04 – 2013-08-05 (×5): 1 via ORAL
  Filled 2013-08-04 (×6): qty 1

## 2013-08-04 MED ORDER — INSULIN ASPART 100 UNIT/ML ~~LOC~~ SOLN
15.0000 [IU] | Freq: Three times a day (TID) | SUBCUTANEOUS | Status: DC
  Administered 2013-08-04 – 2013-08-06 (×4): 15 [IU] via SUBCUTANEOUS

## 2013-08-04 MED ORDER — INSULIN ASPART 100 UNIT/ML ~~LOC~~ SOLN
0.0000 [IU] | Freq: Three times a day (TID) | SUBCUTANEOUS | Status: DC
  Administered 2013-08-04 (×2): 5 [IU] via SUBCUTANEOUS
  Administered 2013-08-05: 3 [IU] via SUBCUTANEOUS
  Administered 2013-08-05: 8 [IU] via SUBCUTANEOUS
  Administered 2013-08-05 – 2013-08-06 (×3): 3 [IU] via SUBCUTANEOUS

## 2013-08-04 NOTE — Progress Notes (Signed)
Patient [name]      DOB: Jun 20, 1935      JME:[id number]   Palliative Medicine Team at [facility] Progress Note    Subjective: Patient complaining of left flank pain, and fullness.Feeling sick most of the day. No vomiting but nauseated and did not eat. Reports two large BM yesterday but not today.     Filed Vitals:   08/04/13 2118  BP: 152/69  Pulse: 77  Temp: 98.2 F (36.8 C)  Resp: 18   Physical exam:   General: mild to moderate distress PERRL, anicteric, mmm  Chest: decreased but clear Abd: positive ascites but no visible bruising, no guarding or rebound. Can't feel spleen due to habitus and ascites. Extremities: right leg less red and less swollen today , lymph edema chronic Neuro: oriented to self and location , and chronic medical history    Lab Results  Component Value Date   CREATININE 1.07 08/04/2013   BUN 19 08/04/2013   NA 140 08/04/2013   K 4.1 08/04/2013   CL 99 08/04/2013   CO2 33* 08/04/2013   Lab Results  Component Value Date   WBC 2.8* 08/04/2013   HGB 11.0* 08/04/2013   HCT 33.3* 08/04/2013   MCV 101.8* 08/04/2013   PLT 31* 08/04/2013    Assessment and plan:77 yr old white male with alcohol induced liver disease diagnosed with hepatic cancer s/p radiation seed placement admitted with hepatic encephalopathy, acute on chronic cellulitis of the right leg.  He had a fall at home before coming and is now complaining of left flank pain and fullness.  Exam without excessive bruising or guarding but something is not right .  He is at high risk for bleeding related to thrombocytopenia  1.  DNR  2.  Abd pain/fullness with nausea: recheck CT abd to rule out trauma with slow bleed  3.  Cirrhosis s/p tips no with hepatocellular carcinoma: follow up as out pt with Dr. Carlean Purl and IR.  4.  Nausea: add does of zofran  Family may consider having hospice play a role in near future for now their desire is to treat the treatable.   Total Time:  30  min  Discussed with Dr. Rodell Perna L. Lovena Le, MD MBA The Palliative Medicine Team at [facility] Phone: [phone] Pager: [phone]

## 2013-08-04 NOTE — Progress Notes (Signed)
PROGRESS NOTE  [name] OEV:[id number] DOB: [birth year] DOA: 08/02/2013 PCP: Unice Cobble, MD  Assessment/Plan:  hyperglycemia - sliding-scale coverage insulin.increase to medium scale (on steroids)- add 15 unit with meals as he may need less insulin at d/c At home he takes:  novolog, 25 units with breakfast, 15 units with lunch and 45 units with the evening meal. Also, if you eat at bedtime, take 5 units then.  - had hypoglycemic episode to bring to hospital  Hepatic encephalopathy- ammonia levels decreased. Lactulose -patient awake and interacting more today  Ascites- for paracentesis.  - empiric coverage for SBP.  -CT head ok  Cirrhosis of liver - continue diuretics.   Diabetes mellitus - see above   CAD status post CABG - denies any chest pain.   Hepatocellular carcinoma - per Dr. Carlean Purl.   History of gout - on prednisone.   Chronic anemia and thrombocytopenia - follow CBC.   Chronic kidney disease stage III - closely follow intake output   Code Status: DNR Family Communication:  Disposition Plan: home once cultures back   Consultants:  palliative care  Procedures:      HPI/Subjective: Up in chair, eating breakfast Feels nauseaous  Objective: Filed Vitals:   08/04/13 0553  BP: 117/47  Pulse: 60  Temp: 98.1 F (36.7 C)  Resp: 16    Intake/Output Summary (Last 24 hours) at 08/04/13 0911 Last data filed at 08/04/13 0554  Gross per 24 hour  Intake   1390 ml  Output   1950 ml  Net   -560 ml   Filed Weights   08/02/13 2019  Weight: 112.855 kg (248 lb 12.8 oz)    Exam:   General:  Smiling, NAD  Cardiovascular: rrr  Respiratory: clear  Abdomen: +BS, obese  Musculoskeletal: edema   Data Reviewed: Basic Metabolic Panel:  Recent Labs Lab 08/02/13 0245 08/02/13 1008 08/03/13 0458 08/04/13 0607  NA 142 143 138 140  K 3.6* 4.6 4.1 4.1  CL 100 101 98 99  CO2 29 30 29  33*  GLUCOSE 104* 166* 223* 207*  BUN 20 20 18 19     CREATININE 1.36* 1.24 1.05 1.07  CALCIUM 9.2 9.2 9.0 8.8   Liver Function Tests:  Recent Labs Lab 08/02/13 0245 08/02/13 1008  AST 79* 72*  ALT 39 37  ALKPHOS 226* 217*  BILITOT 2.2* 2.4*  PROT 6.0 5.8*  ALBUMIN 3.0* 2.9*   No results found for this basename: LIPASE, AMYLASE,  in the last 168 hours  Recent Labs Lab 08/02/13 0310 08/02/13 1008  AMMONIA 71* 34   CBC:  Recent Labs Lab 08/02/13 0245 08/02/13 1008 08/03/13 0458 08/04/13 0607  WBC 4.1 3.9* 3.4* 2.8*  NEUTROABS 2.9 2.8  --   --   HGB 11.5* 11.0* 11.2* 11.0*  HCT 35.0* 34.1* 34.6* 33.3*  MCV 100.0 100.3* 101.5* 101.8*  PLT 49* 39* 37* 31*   Cardiac Enzymes: No results found for this basename: CKTOTAL, CKMB, CKMBINDEX, TROPONINI,  in the last 168 hours BNP (last 3 results)  Recent Labs  08/02/13 0245  PROBNP 795.6*   CBG:  Recent Labs Lab 08/03/13 0747 08/03/13 1147 08/03/13 1648 08/03/13 2106 08/04/13 0802  GLUCAP 173* 215* 267* 331* 196*    Recent Results (from the past 240 hour(s))  BODY FLUID CULTURE     Status: None   Collection Time    08/03/13 11:05 AM      Result Value Ref Range Status   Specimen Description ASCITIC FLUID  Final   Special Requests NONE   Final   Gram Stain     Final   Value: WBC PRESENT, PREDOMINANTLY PMN     NO ORGANISMS SEEN     Performed at [facility]   Culture PENDING   Incomplete   Report Status PENDING   Incomplete     Studies: US Paracentesis  08/03/2013   CLINICAL DATA:  Hepatocellular carcinoma, cirrhosis, chronic kidney disease, ascites. Request is made for diagnostic and therapeutic paracentesis up to 2 liters.  EXAM: ULTRASOUND GUIDED DIAGNOSTIC AND THERAPEUTIC PARACENTESIS  COMPARISON:  None.  PROCEDURE: An ultrasound guided paracentesis was thoroughly discussed with the patient and questions answered. The benefits, risks, alternatives and complications were also discussed. The patient understands and wishes to proceed with the  procedure. Written consent was obtained.  Ultrasound was performed to localize and mark an adequate pocket of fluid in the right lower quadrant of the abdomen. The area was then prepped and draped in the normal sterile fashion. 1% Lidocaine was used for local anesthesia. Under ultrasound guidance a 19 gauge Yueh catheter was introduced. Paracentesis was performed. The catheter was removed and a dressing applied.  Complications: None.  FINDINGS: A total of approximately 1.2 liters of slightly turbid, yellow fluid was removed. A fluid sample was sent for laboratory analysis.  IMPRESSION: Successful ultrasound guided paracentesis yielding 1.2 liters of ascites.  Read by: Rowe Robert ,P.A.-C.   Electronically Signed   By: Markus Daft M.D.   On: 08/03/2013 11:28    Scheduled Meds: . allopurinol  150 mg Oral q morning - 10a  . antiseptic oral rinse  15 mL Mouth Rinse BID  . cefoTAXime (CLAFORAN) IV  1 g Intravenous Q8H  . ferrous sulfate  325 mg Oral BID  . furosemide  40 mg Oral BID  . insulin aspart  0-9 Units Subcutaneous TID WC  . isosorbide mononitrate  60 mg Oral Daily  . lactulose  30 g Oral TID WC & HS  . loratadine  10 mg Oral q morning - 10a  . metoprolol tartrate  12.5 mg Oral BID  . pantoprazole  40 mg Oral Daily  . predniSONE  5 mg Oral Daily  . rifaximin  550 mg Oral BID  . sodium chloride  3 mL Intravenous Q12H  . spironolactone  50 mg Oral Daily  . tamsulosin  0.4 mg Oral Daily  . vitamin B-1  125 mg Oral Daily  . triamcinolone cream  1 application Topical Daily  . vitamin C  500 mg Oral Daily   Continuous Infusions:  Antibiotics Given (last 72 hours)   Date/Time Action Medication Dose Rate   08/02/13 1118 Given   rifaximin (XIFAXAN) tablet 550 mg 550 mg    08/02/13 1334 Given   cefoTAXime (CLAFORAN) 1 g in dextrose 5 % 50 mL IVPB 1 g 100 mL/hr   08/02/13 2016 Given   cefoTAXime (CLAFORAN) 1 g in dextrose 5 % 50 mL IVPB 1 g 100 mL/hr   08/02/13 2258 Given   rifaximin  (XIFAXAN) tablet 550 mg 550 mg    08/03/13 0418 Given   cefoTAXime (CLAFORAN) 1 g in dextrose 5 % 50 mL IVPB 1 g 100 mL/hr   08/03/13 1145 Given   rifaximin (XIFAXAN) tablet 550 mg 550 mg    08/03/13 1224 Given   cefoTAXime (CLAFORAN) 1 g in dextrose 5 % 50 mL IVPB 1 g 100 mL/hr   08/03/13 2152 Given   rifaximin (XIFAXAN) tablet  550 mg 550 mg    08/03/13 2152 Given   cefoTAXime (CLAFORAN) 1 g in dextrose 5 % 50 mL IVPB 1 g 100 mL/hr   08/04/13 0526 Given   cefoTAXime (CLAFORAN) 1 g in dextrose 5 % 50 mL IVPB 1 g 100 mL/hr      Principal Problem:   Encephalopathy, hepatic Active Problems:   Hepatic encephalopathy   CAD (coronary artery disease)   DM2 (diabetes mellitus, type 2)   Abdominal pain   Hypoglycemia   Altered mental status   Palliative care encounter   Weakness generalized    Time spent: 35 min    VANN, [place] [facility] Pager [phone]. If 7PM-7AM, please contact night-coverage at [email or web address], password [facility] 08/04/2013, 9:11 AM  LOS: 2 days

## 2013-08-05 DIAGNOSIS — C228 Malignant neoplasm of liver, primary, unspecified as to type: Secondary | ICD-10-CM

## 2013-08-05 DIAGNOSIS — R112 Nausea with vomiting, unspecified: Secondary | ICD-10-CM

## 2013-08-05 DIAGNOSIS — K7030 Alcoholic cirrhosis of liver without ascites: Secondary | ICD-10-CM

## 2013-08-05 LAB — BASIC METABOLIC PANEL
BUN: 20 mg/dL (ref 6–23)
CO2: 31 meq/L (ref 19–32)
Calcium: 9.4 mg/dL (ref 8.4–10.5)
Chloride: 97 mEq/L (ref 96–112)
Creatinine, Ser: 1.14 mg/dL (ref 0.50–1.35)
GFR calc Af Amer: 71 mL/min — ABNORMAL LOW (ref >90)
GFR calc non Af Amer: 61 mL/min — ABNORMAL LOW (ref >90)
GLUCOSE: 180 mg/dL — AB (ref 70–99)
POTASSIUM: 4.4 meq/L (ref 3.7–5.3)
SODIUM: 138 meq/L (ref 137–147)

## 2013-08-05 LAB — CBC
HCT: 37.6 % — ABNORMAL LOW (ref 39.0–52.0)
HEMOGLOBIN: 12.2 g/dL — AB (ref 13.0–17.0)
MCH: 32.7 pg (ref 26.0–34.0)
MCHC: 32.4 g/dL (ref 30.0–36.0)
MCV: 100.8 fL — ABNORMAL HIGH (ref 78.0–100.0)
Platelets: 40 10*3/uL — ABNORMAL LOW (ref 150–400)
RBC: 3.73 MIL/uL — AB (ref 4.22–5.81)
RDW: 16.4 % — ABNORMAL HIGH (ref 11.5–15.5)
WBC: 3.7 10*3/uL — ABNORMAL LOW (ref 4.0–10.5)

## 2013-08-05 LAB — URINALYSIS, ROUTINE W REFLEX MICROSCOPIC
BILIRUBIN URINE: NEGATIVE
Glucose, UA: NEGATIVE mg/dL
Ketones, ur: 15 mg/dL — AB
NITRITE: NEGATIVE
PH: 5.5 (ref 5.0–8.0)
Protein, ur: 30 mg/dL — AB
SPECIFIC GRAVITY, URINE: 1.021 (ref 1.005–1.030)
UROBILINOGEN UA: 0.2 mg/dL (ref 0.0–1.0)

## 2013-08-05 LAB — GLUCOSE, CAPILLARY
GLUCOSE-CAPILLARY: 185 mg/dL — AB (ref 70–99)
GLUCOSE-CAPILLARY: 200 mg/dL — AB (ref 70–99)
Glucose-Capillary: 165 mg/dL — ABNORMAL HIGH (ref 70–99)
Glucose-Capillary: 258 mg/dL — ABNORMAL HIGH (ref 70–99)

## 2013-08-05 LAB — URINE MICROSCOPIC-ADD ON

## 2013-08-05 LAB — PATHOLOGIST SMEAR REVIEW

## 2013-08-05 LAB — LIPASE, BLOOD: Lipase: 18 U/L (ref 11–59)

## 2013-08-05 LAB — CLOSTRIDIUM DIFFICILE BY PCR: Toxigenic C. Difficile by PCR: POSITIVE — AB

## 2013-08-05 MED ORDER — METOCLOPRAMIDE HCL 5 MG PO TABS
5.0000 mg | ORAL_TABLET | Freq: Three times a day (TID) | ORAL | Status: DC
  Administered 2013-08-05 – 2013-08-06 (×6): 5 mg via ORAL
  Filled 2013-08-05 (×8): qty 1

## 2013-08-05 MED ORDER — VANCOMYCIN 50 MG/ML ORAL SOLUTION
125.0000 mg | Freq: Four times a day (QID) | ORAL | Status: DC
  Administered 2013-08-05 – 2013-08-06 (×5): 125 mg via ORAL
  Filled 2013-08-05 (×6): qty 2.5

## 2013-08-05 MED ORDER — LACTULOSE 10 GM/15ML PO SOLN
30.0000 g | Freq: Two times a day (BID) | ORAL | Status: DC
  Administered 2013-08-06: 30 g via ORAL
  Filled 2013-08-05 (×3): qty 45

## 2013-08-05 MED ORDER — FENTANYL 50 MCG/HR TD PT72
100.0000 ug | MEDICATED_PATCH | TRANSDERMAL | Status: DC
  Administered 2013-08-05: 100 ug via TRANSDERMAL
  Filled 2013-08-05: qty 2

## 2013-08-05 NOTE — Progress Notes (Signed)
Patient RN:[name]      DOB: 01-12-1936      XUX:[id number]   Palliative Medicine Team at [facility] Progress Note    Subjective: Patient awake but still sick this am threw up times two.  Refusing his lactulose stating anything he puts in his mouth makes him nauseated. Still with abdomen pain and fullness, also reporting chills.These are new symptoms since admission.  Worked with Jassiel on reasons to take the lactulose.  He told his wife that if this was living he didn't want to do it any more.    Filed Vitals:   08/05/13 0428  BP: 124/58  Pulse: 66  Temp: 97.9 F (36.6 C)  Resp: 20   Physical exam:  General: affect flat, responses slow but accurate PERRL, EOMI, anicteric, mmm Chest Decreased but clear CVS: II/VI high pitch murmur in all fields, S1, S2 regular Abd: ascites, no guarding or rebound. Diffusely tender per patient but he does not wince or grimace Ext: right leg less swollen and red , lymph edema remains present Neuro: slow, difficult to know if he really understanding everything   CT with no evidence for splenic bleeding or new findings from last CT- liver masses ascites etc.   Assessment and plan: 78 yr old with hepatocellular carcinoma , alcoholic cirrhosis, now with nausea vomiting and chills and increased abdomen pain.  Unclear etiology but worrisome for systemic infection-chills,nausea.  CT without evidence for splenic hematoma after fall.  Consider check blood cultures, and repeat UA since cathter was placed. Already on Cefotaxime so cultures might be moot.  1.  DNR  2.  Goals per spouse were treat the treatable , not quite ready to invoke hospice care.  3.  Consider repeat eval for infection- urine? Vs add liver enzymes to labs this am to see if something changed with liver ( no icterus...)  Updated spouse on CT findings.  Total time:  20 min 11-1018  [name] Whetsel L. Lovena Le, MD MBA The Palliative Medicine Team at [facility] Phone: [phone] Pager:  [phone]

## 2013-08-05 NOTE — Care Management Note (Signed)
    Page 1 of 1   08/06/2013     6:07:55 PM CARE MANAGEMENT NOTE 08/06/2013  Patient:  [name], [name]   Account Number:  [id number]  Date Initiated:  08/05/2013  Documentation initiated by:  Tomi Bamberger  Subjective/Objective Assessment:   dx hep encephalopahty  admit- lives with spouse.     Action/Plan:   Palliative meeting- no hospiec at this time per note.   Anticipated DC Date:  08/06/2013   Anticipated DC Plan:  [place]  CM consult      Choice offered to / List presented to:             Status of service:  Completed, signed off Medicare Important Message given?  YES (If response is "NO", the following Medicare IM given date fields will be blank) Date Medicare IM given:  08/05/2013 Date Additional Medicare IM given:    Discharge Disposition:  HOME/SELF CARE  Per UR Regulation:  Reviewed for med. necessity/level of care/duration of stay  If discussed at [place] of Stay Meetings, dates discussed:    Comments:  08/06/14 [place] RN, BSN [phone] patient is for dc today, patient will need po vanc, NCM faxed script over to [facility] and the price is $2.45, NCM informed spouse, she states she will go over to pick up from [facility] while patient is in procedure. Wife also asked NCM to fax other scripts to [facility] in [place], scripts were faxed and given to floor RN.

## 2013-08-05 NOTE — Progress Notes (Signed)
Patient ate less than 50% of breakfast and has 15 units meal coverage orders. MD Eliseo Squires paged to clarify insulin order. MD Eliseo Squires telephone order to only give sliding scale this AM and hold this AM meal coverage insulin. MD Eliseo Squires verbal order to only give 15 units meal coverage insulin when patient eats 50% or more of meals. MD Eliseo Squires also informed that blood was noted to patient's emesis. Orders received. Will continue to monitor.

## 2013-08-05 NOTE — Progress Notes (Addendum)
PROGRESS NOTE  [name] IFO:[id number] DOB: 1935/12/06 DOA: 08/02/2013 PCP: Unice Cobble, MD  Assessment/Plan: c dif -add PO vanc  hyperglycemia - sliding-scale coverage insulin.increase to medium scale (on steroids)- add 15 unit with meals as he may need less insulin at d/c At home he takes:  novolog, 25 units with breakfast, 15 units with lunch and 45 units with the evening meal. Also, if you eat at bedtime, take 5 units then.  - had hypoglycemic episode to bring to hospital  Hepatic encephalopathy- ammonia levels decreased. Lactulose- decrease amount of times due to increase diarrhea -patient awake and interacting more today  Ascites- for paracentesis.  - no sign of infection  N/V -check lipase -reglan -zofran PRN  Cirrhosis of liver - continue diuretics.   Diabetes mellitus - see above   CAD status post CABG - denies any chest pain.   Hepatocellular carcinoma - per Dr. Carlean Purl.   History of gout - on prednisone.   Chronic anemia and thrombocytopenia - follow CBC.   Chronic kidney disease stage III - closely follow intake output   Code Status: DNR Family Communication: wife at bedside Disposition Plan:    Consultants:  palliative care  Procedures:      HPI/Subjective: Not able to eat Watery diarrhea  Objective: Filed Vitals:   08/05/13 1100  BP: 123/48  Pulse: 70  Temp:   Resp: 18    Intake/Output Summary (Last 24 hours) at 08/05/13 1251 Last data filed at 08/05/13 1000  Gross per 24 hour  Intake    240 ml  Output   1151 ml  Net   -911 ml   Filed Weights   08/02/13 2019  Weight: 112.855 kg (248 lb 12.8 oz)    Exam:   General:  Smiling, uncomfortable appearing  Cardiovascular: rrr  Respiratory: clear  Abdomen: +BS, obese  Musculoskeletal: edema   Data Reviewed: Basic Metabolic Panel:  Recent Labs Lab 08/02/13 0245 08/02/13 1008 08/03/13 0458 08/04/13 0607 08/05/13 0549  NA 142 143 138 140 138  K 3.6* 4.6 4.1  4.1 4.4  CL 100 101 98 99 97  CO2 29 30 29  33* 31  GLUCOSE 104* 166* 223* 207* 180*  BUN 20 20 18 19 20   CREATININE 1.36* 1.24 1.05 1.07 1.14  CALCIUM 9.2 9.2 9.0 8.8 9.4   Liver Function Tests:  Recent Labs Lab 08/02/13 0245 08/02/13 1008  AST 79* 72*  ALT 39 37  ALKPHOS 226* 217*  BILITOT 2.2* 2.4*  PROT 6.0 5.8*  ALBUMIN 3.0* 2.9*   No results found for this basename: LIPASE, AMYLASE,  in the last 168 hours  Recent Labs Lab 08/02/13 0310 08/02/13 1008  AMMONIA 71* 34   CBC:  Recent Labs Lab 08/02/13 0245 08/02/13 1008 08/03/13 0458 08/04/13 0607 08/05/13 0549  WBC 4.1 3.9* 3.4* 2.8* 3.7*  NEUTROABS 2.9 2.8  --   --   --   HGB 11.5* 11.0* 11.2* 11.0* 12.2*  HCT 35.0* 34.1* 34.6* 33.3* 37.6*  MCV 100.0 100.3* 101.5* 101.8* 100.8*  PLT 49* 39* 37* 31* 40*   Cardiac Enzymes: No results found for this basename: CKTOTAL, CKMB, CKMBINDEX, TROPONINI,  in the last 168 hours BNP (last 3 results)  Recent Labs  08/02/13 0245  PROBNP 795.6*   CBG:  Recent Labs Lab 08/04/13 1152 08/04/13 1721 08/04/13 2153 08/05/13 0754 08/05/13 1149  GLUCAP 250* 229* 108* 165* 185*    Recent Results (from the past 240 hour(s))  BODY FLUID CULTURE  Status: None   Collection Time    08/03/13 11:05 AM      Result Value Ref Range Status   Specimen Description ASCITIC FLUID   Final   Special Requests NONE   Final   Gram Stain     Final   Value: WBC PRESENT, PREDOMINANTLY PMN     NO ORGANISMS SEEN     Performed at [facility]   Culture     Final   Value: NO GROWTH 2 DAYS     Performed at [facility]   Report Status PENDING   Incomplete     Studies: Ct Abdomen Pelvis Wo Contrast  08/05/2013   CLINICAL DATA:  Abdominal pain and distention.  EXAM: CT ABDOMEN AND PELVIS WITHOUT CONTRAST  TECHNIQUE: Multidetector CT imaging of the abdomen and pelvis was performed following the standard protocol without IV contrast.  COMPARISON:  CT of the abdomen  and pelvis performed 08/02/2013, and MRI of the abdomen performed 07/18/2013  FINDINGS: A small left pleural effusion is noted, with associated atelectasis. Diffuse coronary artery calcifications are seen.  Diffuse cirrhotic changes again noted at the liver. Vague masses within the liver reflect the patient's known history of hepatocellular carcinoma. The patient's TIPS is incompletely assessed without contrast. The spleen is significantly enlarged, measuring 17.8 cm in length. Stones are seen layering dependently within the gallbladder; the gallbladder is otherwise unremarkable in appearance, though not well assessed due to ascites.  Small to moderate volume ascites is noted within the abdomen and pelvis. Known thrombus within the portal venous system is not well assessed without contrast. Vague soft tissue inflammation within the mesentery of the upper abdomen is nonspecific but appears grossly stable.  The kidneys are unremarkable in appearance. There is no evidence of hydronephrosis. No renal or ureteral stones are seen. No perinephric stranding is appreciated.  No free fluid is identified. The small bowel is unremarkable in appearance. The stomach is within normal limits. No acute vascular abnormalities are seen. Relatively diffuse calcification is seen along the abdominal aorta and its branches.  The appendix is filled with air and is unremarkable in appearance. There is no evidence for appendicitis. Fatty infiltration is noted at the ileocecal valve. Contrast progresses to the level of the transverse colon. Scattered diverticulosis is seen along the descending and proximal sigmoid colon, without evidence of diverticulitis.  The bladder is decompressed, with a Foley catheter in place. The prostate remains normal in size. No inguinal lymphadenopathy is seen.  No acute osseous abnormalities are identified.  IMPRESSION: 1. Small to moderate volume ascites again noted within the abdomen and pelvis, perhaps mildly  increased from the recent prior CT. 2. Hepatic cirrhosis again noted; known multifocal hepatocellular carcinoma is better characterized on prior MRI. The TIPS is incompletely assessed without contrast; known thrombus within the portal venous system is difficult to assess without contrast. 3. Splenomegaly again noted. 4. Cholelithiasis noted. 5. Scattered diverticulosis along the descending and proximal sigmoid colon, without evidence of diverticulitis. 6. Relatively diffuse calcification along the abdominal aorta and its branches. 7. Diffuse coronary artery calcifications seen. 8. Small left pleural effusion, with associated atelectasis.   Electronically Signed   By: Garald Balding M.D.   On: 08/05/2013 01:45    Scheduled Meds: . allopurinol  150 mg Oral q morning - 10a  . antiseptic oral rinse  15 mL Mouth Rinse BID  . fentaNYL  100 mcg Transdermal Q72H  . ferrous sulfate  325 mg Oral BID  .  furosemide  40 mg Oral BID  . insulin aspart  0-15 Units Subcutaneous TID WC  . insulin aspart  0-5 Units Subcutaneous QHS  . insulin aspart  15 Units Subcutaneous TID WC  . isosorbide mononitrate  60 mg Oral Daily  . lactulose  30 g Oral BID  . loratadine  10 mg Oral q morning - 10a  . metoCLOPramide  5 mg Oral TID AC & HS  . metoprolol tartrate  12.5 mg Oral BID  . pantoprazole  40 mg Oral Daily  . predniSONE  5 mg Oral Daily  . rifaximin  550 mg Oral BID  . sodium chloride  3 mL Intravenous Q12H  . spironolactone  50 mg Oral Daily  . tamsulosin  0.4 mg Oral Daily  . vitamin B-1  125 mg Oral Daily  . triamcinolone cream  1 application Topical Daily  . vitamin C  500 mg Oral Daily   Continuous Infusions:  Antibiotics Given (last 72 hours)   Date/Time Action Medication Dose Rate   08/02/13 1334 Given   cefoTAXime (CLAFORAN) 1 g in dextrose 5 % 50 mL IVPB 1 g 100 mL/hr   08/02/13 2016 Given   cefoTAXime (CLAFORAN) 1 g in dextrose 5 % 50 mL IVPB 1 g 100 mL/hr   08/02/13 2258 Given   rifaximin  (XIFAXAN) tablet 550 mg 550 mg    08/03/13 0418 Given   cefoTAXime (CLAFORAN) 1 g in dextrose 5 % 50 mL IVPB 1 g 100 mL/hr   08/03/13 1145 Given   rifaximin (XIFAXAN) tablet 550 mg 550 mg    08/03/13 1224 Given   cefoTAXime (CLAFORAN) 1 g in dextrose 5 % 50 mL IVPB 1 g 100 mL/hr   08/03/13 2152 Given   rifaximin (XIFAXAN) tablet 550 mg 550 mg    08/03/13 2152 Given   cefoTAXime (CLAFORAN) 1 g in dextrose 5 % 50 mL IVPB 1 g 100 mL/hr   08/04/13 0526 Given   cefoTAXime (CLAFORAN) 1 g in dextrose 5 % 50 mL IVPB 1 g 100 mL/hr   08/04/13 0935 Given   rifaximin (XIFAXAN) tablet 550 mg 550 mg    08/04/13 1252 Given   cefoTAXime (CLAFORAN) 1 g in dextrose 5 % 50 mL IVPB 1 g 100 mL/hr   08/04/13 2206 Given   rifaximin (XIFAXAN) tablet 550 mg 550 mg    08/05/13 0110 Given   cefoTAXime (CLAFORAN) 1 g in dextrose 5 % 50 mL IVPB 1 g 100 mL/hr   08/05/13 0557 Given   cefoTAXime (CLAFORAN) 1 g in dextrose 5 % 50 mL IVPB 1 g 100 mL/hr   08/05/13 1043 Given   rifaximin (XIFAXAN) tablet 550 mg 550 mg       Principal Problem:   Encephalopathy, hepatic Active Problems:   Hepatic encephalopathy   CAD (coronary artery disease)   DM2 (diabetes mellitus, type 2)   Abdominal pain   Hypoglycemia   Altered mental status   Palliative care encounter   Weakness generalized    Time spent: 25 min    Mckenze Slone, [facility] Pager [phone]. If 7PM-7AM, please contact night-coverage at [email or web address], password [facility] 08/05/2013, 12:51 PM  LOS: 3 days

## 2013-08-06 ENCOUNTER — Other Ambulatory Visit: Payer: Medicare Other

## 2013-08-06 ENCOUNTER — Inpatient Hospital Stay (HOSPITAL_COMMUNITY): Payer: Medicare Other

## 2013-08-06 DIAGNOSIS — A0472 Enterocolitis due to Clostridium difficile, not specified as recurrent: Secondary | ICD-10-CM

## 2013-08-06 LAB — GLUCOSE, CAPILLARY
GLUCOSE-CAPILLARY: 163 mg/dL — AB (ref 70–99)
GLUCOSE-CAPILLARY: 162 mg/dL — AB (ref 70–99)
Glucose-Capillary: 80 mg/dL (ref 70–99)

## 2013-08-06 MED ORDER — ONDANSETRON HCL 8 MG PO TABS: 8.0000 mg | ORAL_TABLET | Freq: Two times a day (BID) | ORAL | Status: DC | PRN

## 2013-08-06 MED ORDER — HYDROCODONE-ACETAMINOPHEN 5-325 MG PO TABS: 1.0000 | ORAL_TABLET | ORAL | Status: DC | PRN

## 2013-08-06 MED ORDER — TAMSULOSIN HCL 0.4 MG PO CAPS: 0.4000 mg | ORAL_CAPSULE | Freq: Every day | ORAL | Status: DC

## 2013-08-06 MED ORDER — VANCOMYCIN 50 MG/ML ORAL SOLUTION: 125.0000 mg | Freq: Four times a day (QID) | ORAL | Status: DC

## 2013-08-06 MED ORDER — PANTOPRAZOLE SODIUM 40 MG PO TBEC: 40.0000 mg | DELAYED_RELEASE_TABLET | Freq: Every day | ORAL | Status: DC

## 2013-08-06 MED ORDER — METOCLOPRAMIDE HCL 5 MG PO TABS: 5.0000 mg | ORAL_TABLET | Freq: Three times a day (TID) | ORAL | Status: DC

## 2013-08-06 MED ORDER — INSULIN ASPART 100 UNIT/ML ~~LOC~~ SOLN: 15.0000 [IU] | Freq: Three times a day (TID) | SUBCUTANEOUS | Status: DC

## 2013-08-06 NOTE — Discharge Summary (Signed)
Physician Discharge Summary  [name] MVH:[id number] DOB: 1935-09-26 DOA: 08/02/2013  PCP: Unice Cobble, MD  Admit date: 08/02/2013 Discharge date: 08/06/2013  Time spent: 35 minutes  Recommendations for Outpatient Follow-up:  1. Cbc, bmp 1 week 2. Watch for signs  Of infection 3. Decreased novolog  Discharge Diagnoses:  Principal Problem:   Encephalopathy, hepatic Active Problems:   Hepatic encephalopathy   CAD (coronary artery disease)   DM2 (diabetes mellitus, type 2)   Abdominal pain   Hypoglycemia   Altered mental status   Palliative care encounter   Weakness generalized   Discharge Condition: improved  Diet recommendation: carb mod  Filed Weights   08/02/13 2019  Weight: 112.855 kg (248 lb 12.8 oz)    History of present illness:  [name] is a 78 y.o. male history of hepatocellular carcinoma status post radiation seeding, cirrhosis of liver, diabetes mellitus, OSA on CPAP early morning around 2 AM felt weak and on recheck his sugar went he walked towards his kitchen and fell. EMS was called and pressure was found to be around 61. Patient was found to be weak and was brought to the ER. In the ER lab works revealed ammonia was around 71 and his blood sugar has increased to 118. Patient is complaining of intense pain in his right shoulder abdomen. Patient's wife states that he looks more confused than what is usually is and his abdomen is more distended than usual. On exam patient has tenderness on his both upper quadrants of his abdomen and his right shoulder pain is improved. Patient is lethargic. He was recently started on antibiotics for his right lower extremities cellulitis. That has improved. Patient otherwise did not complain of any chest pain nausea vomiting diarrhea shortness of breath.    Hospital Course:  c dif  -add PO vanc  -14 days of treatment  hyperglycemia - sliding-scale coverage insulin.increase to medium scale (on steroids)- add 15 unit with  meals - had hypoglycemic episode to bring to hospital   Hepatic encephalopathy- ammonia levels decreased. Lactulose- decrease amount of times due to increase diarrhea   Ascites- for paracentesis.  - no sign of infection   N/V  -reglan  -zofran PRN   Cirrhosis of liver - continue diuretics.   Diabetes mellitus - see above   CAD status post CABG - denies any chest pain.   Hepatocellular carcinoma - per Dr. Carlean Purl.   History of gout - on prednisone.   Chronic anemia and thrombocytopenia   Chronic kidney disease stage III - closely follow intake output      Procedures:  paracentesis  Consultations:  palliative care  Discharge Exam: Filed Vitals:   08/06/13 1335  BP: 114/52  Pulse: 64  Temp: 97.5 F (36.4 C)  Resp: 18    General: feeling better- wants to go home- thinks he will do better there Cardiovascular: rrr Respiratory: clear  Discharge Instructions You were cared for by a hospitalist during your hospital stay. If you have any questions about your discharge medications or the care you received while you were in the hospital after you are discharged, you can call the unit and asked to speak with the hospitalist on call if the hospitalist that took care of you is not available. Once you are discharged, your primary care physician will handle any further medical issues. Please note that NO REFILLS for any discharge medications will be authorized once you are discharged, as it is imperative that you return to your primary  care physician (or establish a relationship with a primary care physician if you do not have one) for your aftercare needs so that they can reassess your need for medications and monitor your lab values.      Discharge Instructions   Diet - low sodium heart healthy    Complete by:  As directed      Diet Carb Modified    Complete by:  As directed      Increase activity slowly    Complete by:  As directed             Medication List     STOP taking these medications       acetaminophen 500 MG tablet  Commonly known as:  TYLENOL     amitriptyline 10 MG tablet  Commonly known as:  ELAVIL     cephALEXin 500 MG capsule  Commonly known as:  KEFLEX     gabapentin 300 MG capsule  Commonly known as:  NEURONTIN      TAKE these medications       allopurinol 300 MG tablet  Commonly known as:  ZYLOPRIM  Take 150 mg by mouth every morning.     CALCIUM/MAGNESIUM/ZINC FORMULA 1000-500-50 MG Tabs  Generic drug:  Calcium-Magnesium-Zinc  Take 1 tablet by mouth every morning.     fentaNYL 100 MCG/HR  Commonly known as:  DURAGESIC - dosed mcg/hr  Place 100 mcg onto the skin every 3 (three) days.     furosemide 40 MG tablet  Commonly known as:  LASIX  Take 1 tablet (40 mg total) by mouth 2 (two) times daily.     HYDROcodone-acetaminophen 5-325 MG per tablet  Commonly known as:  NORCO/VICODIN  Take 1-2 tablets by mouth every 4 (four) hours as needed for moderate pain.     hyoscyamine 0.125 MG Tbdp disintergrating tablet  Commonly known as:  ANASPAZ  Place 0.125 mg under the tongue every 4 (four) hours as needed for bladder spasms or cramping.     insulin aspart 100 UNIT/ML injection  Commonly known as:  novoLOG  Inject 15 Units into the skin 3 (three) times daily with meals. IF PATIENT EATS >50% of meals     Iron 240 (27 FE) MG Tabs  Take 1 tablet by mouth 2 (two) times daily.     isosorbide mononitrate 60 MG 24 hr tablet  Commonly known as:  IMDUR  Take 60 mg by mouth daily.     lactulose 10 GM/15ML solution  Commonly known as:  CHRONULAC  Take 30 mLs (20 g total) by mouth 2 (two) times daily.     loratadine 10 MG tablet  Commonly known as:  CLARITIN  Take 10 mg by mouth every morning.     metoCLOPramide 5 MG tablet  Commonly known as:  REGLAN  Take 1 tablet (5 mg total) by mouth 4 (four) times daily -  before meals and at bedtime.     metoprolol tartrate 25 MG tablet  Commonly known as:  LOPRESSOR   Take 0.5 tablets (12.5 mg total) by mouth 2 (two) times daily.     nitroGLYCERIN 0.4 MG SL tablet  Commonly known as:  NITROSTAT  Place 1 tablet (0.4 mg total) under the tongue every 5 (five) minutes as needed for chest pain.     omeprazole 20 MG tablet  Commonly known as:  PRILOSEC OTC  Take 20 mg by mouth daily.     ondansetron 8 MG tablet  Commonly known as:  ZOFRAN  Take 1 tablet (8 mg total) by mouth every 12 (twelve) hours as needed for nausea or vomiting.     pantoprazole 40 MG tablet  Commonly known as:  PROTONIX  Take 1 tablet (40 mg total) by mouth daily.     polyethylene glycol packet  Commonly known as:  MIRALAX / GLYCOLAX  Take 17 g by mouth daily as needed. Take two or three times a week     predniSONE 5 MG tablet  Commonly known as:  DELTASONE  Take 5 mg by mouth daily.     rifaximin 550 MG Tabs tablet  Commonly known as:  XIFAXAN  Take 550 mg by mouth 2 (two) times daily.     spironolactone 50 MG tablet  Commonly known as:  ALDACTONE  Take 1 tablet (50 mg total) by mouth daily.     tamsulosin 0.4 MG Caps capsule  Commonly known as:  FLOMAX  Take 1 capsule (0.4 mg total) by mouth daily.     triamcinolone cream 0.1 %  Commonly known as:  KENALOG  Apply 1 application topically daily.     vancomycin 50 mg/mL oral solution  Commonly known as:  VANCOCIN  Take 2.5 mLs (125 mg total) by mouth 4 (four) times daily.     vitamin B-1 250 MG tablet  Take 125 mg by mouth daily.     vitamin C 500 MG tablet  Commonly known as:  ASCORBIC ACID  Take 500 mg by mouth daily.       Allergies  Allergen Reactions  . Shellfish-Derived Products     Rash & angioedema  . Doxycycline Hyclate     Diarrhea   . Amoxicillin-Pot Clavulanate     Unsure what reaction was but pt tolerated penicillin 05/25/2012  . Aspirin     REACTION: unable to tolerate due to cirrhosis and varices  . Buprenorphine Hcl-Naloxone Hcl Other (See Comments)    unresponsive  . Codeine      Rash    . Ezetimibe     REACTION: LEG/JOINT PAIN  . Hydromorphone Hcl     Per wife "pt developed respiratory distress"  . Meperidine Hcl     Per wife "his blood pressure dropped."  . Morphine     Per wife "pt sees spider webs."  . Morphine Sulfate     REACTION: unspecified  . Oxycodone     hallucinations  . Sulfonamide Derivatives Nausea And Vomiting      The results of significant diagnostics from this hospitalization (including imaging, microbiology, ancillary and laboratory) are listed below for reference.    Significant Diagnostic Studies: Ct Abdomen Pelvis Wo Contrast  08/05/2013   CLINICAL DATA:  Abdominal pain and distention.  EXAM: CT ABDOMEN AND PELVIS WITHOUT CONTRAST  TECHNIQUE: Multidetector CT imaging of the abdomen and pelvis was performed following the standard protocol without IV contrast.  COMPARISON:  CT of the abdomen and pelvis performed 08/02/2013, and MRI of the abdomen performed 07/18/2013  FINDINGS: A small left pleural effusion is noted, with associated atelectasis. Diffuse coronary artery calcifications are seen.  Diffuse cirrhotic changes again noted at the liver. Vague masses within the liver reflect the patient's known history of hepatocellular carcinoma. The patient's TIPS is incompletely assessed without contrast. The spleen is significantly enlarged, measuring 17.8 cm in length. Stones are seen layering dependently within the gallbladder; the gallbladder is otherwise unremarkable in appearance, though not well assessed due to ascites.  Small to moderate volume ascites is noted within  the abdomen and pelvis. Known thrombus within the portal venous system is not well assessed without contrast. Vague soft tissue inflammation within the mesentery of the upper abdomen is nonspecific but appears grossly stable.  The kidneys are unremarkable in appearance. There is no evidence of hydronephrosis. No renal or ureteral stones are seen. No perinephric stranding is  appreciated.  No free fluid is identified. The small bowel is unremarkable in appearance. The stomach is within normal limits. No acute vascular abnormalities are seen. Relatively diffuse calcification is seen along the abdominal aorta and its branches.  The appendix is filled with air and is unremarkable in appearance. There is no evidence for appendicitis. Fatty infiltration is noted at the ileocecal valve. Contrast progresses to the level of the transverse colon. Scattered diverticulosis is seen along the descending and proximal sigmoid colon, without evidence of diverticulitis.  The bladder is decompressed, with a Foley catheter in place. The prostate remains normal in size. No inguinal lymphadenopathy is seen.  No acute osseous abnormalities are identified.  IMPRESSION: 1. Small to moderate volume ascites again noted within the abdomen and pelvis, perhaps mildly increased from the recent prior CT. 2. Hepatic cirrhosis again noted; known multifocal hepatocellular carcinoma is better characterized on prior MRI. The TIPS is incompletely assessed without contrast; known thrombus within the portal venous system is difficult to assess without contrast. 3. Splenomegaly again noted. 4. Cholelithiasis noted. 5. Scattered diverticulosis along the descending and proximal sigmoid colon, without evidence of diverticulitis. 6. Relatively diffuse calcification along the abdominal aorta and its branches. 7. Diffuse coronary artery calcifications seen. 8. Small left pleural effusion, with associated atelectasis.   Electronically Signed   By: Garald Balding M.D.   On: 08/05/2013 01:45   Ct Abdomen Pelvis Wo Contrast  08/02/2013   CLINICAL DATA:  Fall with abdominal pain. History of cirrhosis with multifocal HCC post TIPS and Y-90 treatment.  EXAM: CT ABDOMEN AND PELVIS WITHOUT CONTRAST  TECHNIQUE: Multidetector CT imaging of the abdomen and pelvis was performed following the standard protocol without IV contrast.  COMPARISON:   MRI 07/18/2013 and CT 06/28/2013 as well as 03/21/2013  FINDINGS: Lung bases demonstrate a small amount of left pleural fluid with mild dependent bibasilar atelectasis. Sternotomy wires are present. There is calcification in the region of the mitral valve annulus. Is increased density over the region of the aortic valve which may represent of prosthetic valve.  Abdominal images demonstrate a small nodular liver compatible with patient's known cirrhosis. There are 2 ill-defined hypodense masses over the film of the right lobe measuring 3 cm and 2.7 cm respectively without significant change. Remaining known liver lesions are better defined on the recent MRI. Patient's portosystemic shunt is unchanged. There is moderate cholelithiasis. The pancreas and adrenal glands are within normal. Kidneys are normal in size without hydronephrosis or nephrolithiasis. There is mild splenomegaly unchanged. There is evidence of mild ascites which is slightly worse. There is no free peritoneal air. Appendix is normal. There is moderate diverticulosis of the colon. There is moderate calcified plaque involving the abdominal aorta and iliac arteries.  Pelvic images demonstrate a mild-to-moderate amount of free fluid with Hounsfield unit measurements of 18 likely related to patient's ascites secondary to liver disease and not secondary to trauma. Remaining pelvic structures are unremarkable.  There are moderate degenerative changes of the spine with mild degenerative change of the hips. There is no acute fracture.  IMPRESSION: No acute injury in the abdomen/pelvis.  Evidence of known cirrhosis with sequelae  of portal hypertension with splenomegaly and ascites. There has been slight interval worsening of patient's ascites. Known multifocal hepatic cellular carcinoma without significant change and stable portosystemic shunt.  Small amount of left pleural fluid with bibasilar atelectatic change.  Diverticulosis of the colon.  Moderate  cholelithiasis.   Electronically Signed   By: Marin Olp M.D.   On: 08/02/2013 08:20   Dg Shoulder Right  08/02/2013   CLINICAL DATA:  Fall, pain.  EXAM: RIGHT SHOULDER - 2+ VIEW  COMPARISON:  None.  FINDINGS: No acute fracture or dislocation. The humeral head is normally aligned within the glenoid. AC joint is approximated. Subarticular spurring noted at the [facility] joint. No periarticular calcification. No soft tissue abnormality.  Osseous mineralization is normal.  IMPRESSION: No acute fracture or dislocation.   Electronically Signed   By: Jeannine Boga M.D.   On: 08/02/2013 06:28   Ct Head Wo Contrast  08/02/2013   CLINICAL DATA:  Lethargy  EXAM: CT HEAD WITHOUT CONTRAST  TECHNIQUE: Contiguous axial images were obtained from the base of the skull through the vertex without intravenous contrast.  COMPARISON:  04/17/2010  FINDINGS: Mild atrophic changes are seen. Mild chronic white matter ischemic change is again identified. No findings to suggest acute hemorrhage, acute infarction or space-occupying mass lesion are noted. The bony calvarium is intact.  IMPRESSION: Chronic changes without acute abnormality.   Electronically Signed   By: Inez Catalina M.D.   On: 08/02/2013 08:03   Mr Liver W Wo Contrast  07/18/2013   CLINICAL DATA:  Cirrhosis with multifocal HCC status post TIPS and Y-90 microsphere treatment  EXAM: MRI ABDOMEN WITHOUT AND WITH CONTRAST  TECHNIQUE: Multiplanar, multisequence MR imaging was performed both before and after administration of intravenous contrast.  CONTRAST:  9 mL Eovist IV  COMPARISON:  MRI abdomen dated 04/03/2013  FINDINGS: Cirrhosis. TIPS shunt in the right hepatic lobe. Atrophy of the left hepatic lobe versus prior resection. Multiple hypoenhancing liver lesions have improved, as follows:  --2.1 x 2.6 cm hypoenhancing lesion in the posterior right hepatic dome (series 504/image 34), previously 3.7 x 3.8 cm  --1.8 x 1.7 cm hypoenhancing lesion in the anterior segment  right hepatic lobe (series 504/image 36), previously 1.9 x 1.9 cm  --10 mm lesion laterally in the posterior segment right hepatic lobe (series 504/image 51), previously 1.3 x 1.5 cm  --1.2 x 1.4 cm lesion in the central left hepatic lobe (series 13/image 38), previously 1.3 x 1.2 cm  Additional prior 1.9 x 1.9 cm lesion in the anterior segment right hepatic lobe (series 13/image 46) has resolved on the current study.  Splenomegaly, measuring 18.7 cm in maximal craniocaudal dimension.  Small volume perihepatic and perisplenic ascites.  Again noted is subocclusive thrombus within the splenic vein (series 502/image 23) and at the portosplenic confluence (series 502/image 65).  Pancreas and adrenal glands are within normal limits.  Numerous layering gallstones, without associated inflammatory changes.  Kidneys are within normal limits.  No hydronephrosis.  No suspicious abdominal lymphadenopathy.  No focal osseous lesions.  IMPRESSION: Three dominant hypoenhancing hepatic lesions in the right hepatic lobe, measuring up to 2.6 cm as described above, decreased. Additional 1.9 cm lesion in the anterior right hepatic lobe has resolved.  1.4 cm lesion in the central left hepatic lobe, unchanged.  Cirrhosis with splenomegaly and stigmata of portal hypertension, as above. Indwelling TIPS shunt. Small volume abdominal ascites.  Cholelithiasis, without associated inflammatory changes.   Electronically Signed   By: Bertis Ruddy  [place] Pink M.D.   On: 07/18/2013 15:08   US Paracentesis  08/03/2013   CLINICAL DATA:  Hepatocellular carcinoma, cirrhosis, chronic kidney disease, ascites. Request is made for diagnostic and therapeutic paracentesis up to 2 liters.  EXAM: ULTRASOUND GUIDED DIAGNOSTIC AND THERAPEUTIC PARACENTESIS  COMPARISON:  None.  PROCEDURE: An ultrasound guided paracentesis was thoroughly discussed with the patient and questions answered. The benefits, risks, alternatives and complications were also discussed. The  patient understands and wishes to proceed with the procedure. Written consent was obtained.  Ultrasound was performed to localize and mark an adequate pocket of fluid in the right lower quadrant of the abdomen. The area was then prepped and draped in the normal sterile fashion. 1% Lidocaine was used for local anesthesia. Under ultrasound guidance a 19 gauge Yueh catheter was introduced. Paracentesis was performed. The catheter was removed and a dressing applied.  Complications: None.  FINDINGS: A total of approximately 1.2 liters of slightly turbid, yellow fluid was removed. A fluid sample was sent for laboratory analysis.  IMPRESSION: Successful ultrasound guided paracentesis yielding 1.2 liters of ascites.  Read by: Rowe Robert ,P.A.-C.   Electronically Signed   By: Markus Daft M.D.   On: 08/03/2013 11:28   Dg Chest Portable 1 View  08/02/2013   CLINICAL DATA:  Shortness of breath and loss of consciousness tonight. Altered mental status.  EXAM: PORTABLE CHEST - 1 VIEW  COMPARISON:  CT chest 06/28/2013.  Chest 06/21/2012.  FINDINGS: Postoperative changes in the mediastinum. Shallow inspiration. Cardiac enlargement with prominent pulmonary vascularity suggesting mild congestion. No edema or infiltration. No blunting of costophrenic angles. No pneumothorax. Calcification of the aorta.  IMPRESSION: Cardiac enlargement with mild pulmonary vascular congestion. No edema or infiltration.   Electronically Signed   By: Lucienne Capers M.D.   On: 08/02/2013 03:26   Dg Abd 2 Views  08/02/2013   CLINICAL DATA:  Status post small earlier today with loss of consciousness ; epigastric pain; history of previous appendectomy  EXAM: ABDOMEN - 2 VIEW  COMPARISON:  CT scan of the abdomen and pelvis of today's date  FINDINGS: There is a moderate burden throughout the colon. Are small amounts of small bowel gas. No free extraluminal gas collections. There is a TIPS appliance present in the liver. There is surgical wire suture to  the right of the lumbar spine. There are degenerative changes of the lumbar spine.  IMPRESSION: Increased stool burden is consistent with constipation. No obstruction or perforation is demonstrated.   Electronically Signed   By: David  Martinique   On: 08/02/2013 08:06    Microbiology: Recent Results (from the past 240 hour(s))  BODY FLUID CULTURE     Status: None   Collection Time    08/03/13 11:05 AM      Result Value Ref Range Status   Specimen Description ASCITIC FLUID   Final   Special Requests NONE   Final   Gram Stain     Final   Value: WBC PRESENT, PREDOMINANTLY PMN     NO ORGANISMS SEEN     Performed at [facility]   Culture     Final   Value: NO GROWTH 3 DAYS     Performed at [facility]   Report Status PENDING   Incomplete  CULTURE, BLOOD (ROUTINE X 2)     Status: None   Collection Time    08/05/13  2:33 PM      Result Value Ref Range Status   Specimen Description  BLOOD LEFT ANTECUBITAL   Final   Special Requests BOTTLES DRAWN AEROBIC AND ANAEROBIC 10CC   Final   Culture  Setup Time     Final   Value: 08/05/2013 19:37     Performed at [facility]   Culture     Final   Value:        BLOOD CULTURE RECEIVED NO GROWTH TO DATE CULTURE WILL BE HELD FOR 5 DAYS BEFORE ISSUING A FINAL NEGATIVE REPORT     Performed at [facility]   Report Status PENDING   Incomplete  CLOSTRIDIUM DIFFICILE BY PCR     Status: Abnormal   Collection Time    08/05/13  2:38 PM      Result Value Ref Range Status   C difficile by pcr POSITIVE (*) NEGATIVE Final   Comment: CRITICAL RESULT CALLED TO, READ BACK BY AND VERIFIED WITH:     L. DUNN RN 16:35 08/05/13 ([email or web address])  CULTURE, BLOOD (ROUTINE X 2)     Status: None   Collection Time    08/05/13  2:50 PM      Result Value Ref Range Status   Specimen Description BLOOD LEFT WRIST   Final   Special Requests BOTTLES DRAWN AEROBIC ONLY 1CC   Final   Culture  Setup Time     Final   Value: 08/05/2013 19:37     Performed  at [facility]   Culture     Final   Value:        BLOOD CULTURE RECEIVED NO GROWTH TO DATE CULTURE WILL BE HELD FOR 5 DAYS BEFORE ISSUING A FINAL NEGATIVE REPORT     Performed at [facility]   Report Status PENDING   Incomplete     Labs: Basic Metabolic Panel:  Recent Labs Lab 08/02/13 0245 08/02/13 1008 08/03/13 0458 08/04/13 0607 08/05/13 0549  NA 142 143 138 140 138  K 3.6* 4.6 4.1 4.1 4.4  CL 100 101 98 99 97  CO2 29 30 29  33* 31  GLUCOSE 104* 166* 223* 207* 180*  BUN 20 20 18 19 20   CREATININE 1.36* 1.24 1.05 1.07 1.14  CALCIUM 9.2 9.2 9.0 8.8 9.4   Liver Function Tests:  Recent Labs Lab 08/02/13 0245 08/02/13 1008  AST 79* 72*  ALT 39 37  ALKPHOS 226* 217*  BILITOT 2.2* 2.4*  PROT 6.0 5.8*  ALBUMIN 3.0* 2.9*    Recent Labs Lab 08/05/13 1433  LIPASE 18    Recent Labs Lab 08/02/13 0310 08/02/13 1008  AMMONIA 71* 34   CBC:  Recent Labs Lab 08/02/13 0245 08/02/13 1008 08/03/13 0458 08/04/13 0607 08/05/13 0549  WBC 4.1 3.9* 3.4* 2.8* 3.7*  NEUTROABS 2.9 2.8  --   --   --   HGB 11.5* 11.0* 11.2* 11.0* 12.2*  HCT 35.0* 34.1* 34.6* 33.3* 37.6*  MCV 100.0 100.3* 101.5* 101.8* 100.8*  PLT 49* 39* 37* 31* 40*   Cardiac Enzymes: No results found for this basename: CKTOTAL, CKMB, CKMBINDEX, TROPONINI,  in the last 168 hours BNP: BNP (last 3 results)  Recent Labs  08/02/13 0245  PROBNP 795.6*   CBG:  Recent Labs Lab 08/05/13 1149 08/05/13 1711 08/05/13 2120 08/06/13 0806 08/06/13 1148  GLUCAP 185* 258* 200* 163* 162*       Signed:  VANN, JESSICA  [facility] 08/06/2013, 3:20 PM

## 2013-08-06 NOTE — Progress Notes (Signed)
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  Appropriately treatment started and patient looks and feels better today.  Plan if he is able to tolerate lunch is to get [place] for IR and Discharge to home.  Spouse was concerned with risks for "massive GI bleeding "  Which her mother experienced at the end of her life. I counseled her that GI bleeding in the face of thrombocytopenia can happen but that I was not concerned at this time.  Normal vigilance would likely suffice in monitoring him .  Empowered them to use PC services if her returns to hospital for further goals of care.  [name] DNR was placed on the chart for discharge.  Discussed with Dr. Rodell Perna L. Lovena Le, MD MBA The Palliative Medicine Team at [facility] Phone: [phone] Pager: [phone]

## 2013-08-06 NOTE — Progress Notes (Signed)
Subjective: Pt with Hepatocellular carcinoma Y90 intervention performed for liver lesion 05/2013 Also known cirrhosis/ascites TIPs procedure 2010 Was scheduled in Int Radiology outpatient clinic today for follow up with Dr Vernard Gambles But was admitted 4 days ago with syncope/ falls +Cdiff; leg cellulitis; hepatic encephalopathy (elevated ammonia) hyperglycemia   Objective: Vital signs in last 24 hours: Temp:  [97.1 F (36.2 C)-98.8 F (37.1 C)] 97.1 F (36.2 C) (06/16 1002) Pulse Rate:  [70-76] 73 (06/16 1002) Resp:  [16-18] 18 (06/16 1002) BP: (130-196)/(53-92) 130/53 mmHg (06/16 1002) SpO2:  [95 %-99 %] 99 % (06/16 1002) Last BM Date: 08/06/13  Intake/Output from previous day: 06/15 0701 - 06/16 0700 In: 300 [P.O.:300] Out: 1200 [Urine:1200] Intake/Output this shift: Total I/O In: -  Out: 200 [Urine:200]  PE:  Afeb; vss Up in room Pt is appropriate Labs better daily   Lab Results:   Recent Labs  08/04/13 0607 08/05/13 0549  WBC 2.8* 3.7*  HGB 11.0* 12.2*  HCT 33.3* 37.6*  PLT 31* 40*   BMET  Recent Labs  08/04/13 0607 08/05/13 0549  NA 140 138  K 4.1 4.4  CL 99 97  CO2 33* 31  GLUCOSE 207* 180*  BUN 19 20  CREATININE 1.07 1.14  CALCIUM 8.8 9.4   PT/INR No results found for this basename: LABPROT, INR,  in the last 72 hours ABG No results found for this basename: PHART, PCO2, PO2, HCO3,  in the last 72 hours  Studies/Results: Ct Abdomen Pelvis Wo Contrast  08/05/2013   CLINICAL DATA:  Abdominal pain and distention.  EXAM: CT ABDOMEN AND PELVIS WITHOUT CONTRAST  TECHNIQUE: Multidetector CT imaging of the abdomen and pelvis was performed following the standard protocol without IV contrast.  COMPARISON:  CT of the abdomen and pelvis performed 08/02/2013, and MRI of the abdomen performed 07/18/2013  FINDINGS: A small left pleural effusion is noted, with associated atelectasis. Diffuse coronary artery calcifications are seen.  Diffuse cirrhotic  changes again noted at the liver. Vague masses within the liver reflect the patient's known history of hepatocellular carcinoma. The patient's TIPS is incompletely assessed without contrast. The spleen is significantly enlarged, measuring 17.8 cm in length. Stones are seen layering dependently within the gallbladder; the gallbladder is otherwise unremarkable in appearance, though not well assessed due to ascites.  Small to moderate volume ascites is noted within the abdomen and pelvis. Known thrombus within the portal venous system is not well assessed without contrast. Vague soft tissue inflammation within the mesentery of the upper abdomen is nonspecific but appears grossly stable.  The kidneys are unremarkable in appearance. There is no evidence of hydronephrosis. No renal or ureteral stones are seen. No perinephric stranding is appreciated.  No free fluid is identified. The small bowel is unremarkable in appearance. The stomach is within normal limits. No acute vascular abnormalities are seen. Relatively diffuse calcification is seen along the abdominal aorta and its branches.  The appendix is filled with air and is unremarkable in appearance. There is no evidence for appendicitis. Fatty infiltration is noted at the ileocecal valve. Contrast progresses to the level of the transverse colon. Scattered diverticulosis is seen along the descending and proximal sigmoid colon, without evidence of diverticulitis.  The bladder is decompressed, with a Foley catheter in place. The prostate remains normal in size. No inguinal lymphadenopathy is seen.  No acute osseous abnormalities are identified.  IMPRESSION: 1. Small to moderate volume ascites again noted within the abdomen and pelvis, perhaps mildly increased from the  recent prior CT. 2. Hepatic cirrhosis again noted; known multifocal hepatocellular carcinoma is better characterized on prior MRI. The TIPS is incompletely assessed without contrast; known thrombus within  the portal venous system is difficult to assess without contrast. 3. Splenomegaly again noted. 4. Cholelithiasis noted. 5. Scattered diverticulosis along the descending and proximal sigmoid colon, without evidence of diverticulitis. 6. Relatively diffuse calcification along the abdominal aorta and its branches. 7. Diffuse coronary artery calcifications seen. 8. Small left pleural effusion, with associated atelectasis.   Electronically Signed   By: Garald Balding M.D.   On: 08/05/2013 01:45    Anti-infectives: Anti-infectives   Start     Dose/Rate Route Frequency Ordered Stop   08/05/13 1800  vancomycin (VANCOCIN) 50 mg/mL oral solution 125 mg     125 mg Oral 4 times daily 08/05/13 1649 08/19/13 1759   08/02/13 1000  rifaximin (XIFAXAN) tablet 550 mg     550 mg Oral 2 times daily 08/02/13 0838     08/02/13 1000  cefoTAXime (CLAFORAN) 1 g in dextrose 5 % 50 mL IVPB  Status:  Discontinued     1 g 100 mL/hr over 30 Minutes Intravenous Every 8 hours 08/02/13 0838 08/05/13 1221      Assessment/Plan: s/p * No surgery found *  Pt was admitted to [facility] on 6/13 Has had palliative care consult- see note Missed appt for follow up post TIPs 2010 and liver lesion Y90 05/2013 Was scheduled as OP with Dr Vernard Gambles for today Discussed with Dr Eliseo Squires and pt Will order follow up US doppler post TIPs now Plan for dc today possibly per Dr Eliseo Squires   LOS: 4 days    TURPIN,PAMELA A 08/06/2013

## 2013-08-06 NOTE — Progress Notes (Signed)
Patient is doing better with reglan and PO vanc for c diff- able to eat some more- Patient and spouse are anxious to go home today- will recheck after lunch to see how well patient tolerated.  Eulogio Bear DO

## 2013-08-07 ENCOUNTER — Other Ambulatory Visit: Payer: Self-pay | Admitting: *Deleted

## 2013-08-07 ENCOUNTER — Telehealth: Payer: Self-pay | Admitting: Radiology

## 2013-08-07 ENCOUNTER — Telehealth: Payer: Self-pay | Admitting: Endocrinology

## 2013-08-07 LAB — BODY FLUID CULTURE: CULTURE: NO GROWTH

## 2013-08-07 MED ORDER — "INSULIN SYRINGE 30G X 1/2"" 0.5 ML MISC": Status: DC

## 2013-08-07 NOTE — Telephone Encounter (Signed)
Per Dr. Derenda Mis w/ Mrs. Skilton to review results of [place] (08/06/2013) for TIPS follow up.  TIPS patent with no evidence of stenosis or occlusion.    Will schedule follow up Y-90 SIRT for August 2015:    MR abd w/ & w/o contrast.  OV with Dr Donnamae Jude, RN 08/07/2013 2:24 PM

## 2013-08-07 NOTE — Telephone Encounter (Signed)
Patient needs rx of his syringes  Burr :)

## 2013-08-11 ENCOUNTER — Emergency Department (HOSPITAL_COMMUNITY): Payer: Medicare Other

## 2013-08-11 ENCOUNTER — Observation Stay (HOSPITAL_COMMUNITY): Payer: Medicare Other

## 2013-08-11 ENCOUNTER — Inpatient Hospital Stay (HOSPITAL_COMMUNITY)
Admission: EM | Admit: 2013-08-11 | Discharge: 2013-08-12 | DRG: 057 | Disposition: A | Discharge status: Home Health | Payer: Medicare Other | Attending: Internal Medicine | Admitting: Internal Medicine

## 2013-08-11 ENCOUNTER — Encounter (HOSPITAL_COMMUNITY): Payer: Self-pay | Admitting: Emergency Medicine

## 2013-08-11 DIAGNOSIS — I10 Essential (primary) hypertension: Secondary | ICD-10-CM

## 2013-08-11 DIAGNOSIS — R252 Cramp and spasm: Secondary | ICD-10-CM

## 2013-08-11 DIAGNOSIS — D696 Thrombocytopenia, unspecified: Secondary | ICD-10-CM | POA: Diagnosis present

## 2013-08-11 DIAGNOSIS — M6281 Muscle weakness (generalized): Secondary | ICD-10-CM

## 2013-08-11 DIAGNOSIS — Z860100 Personal history of colon polyps, unspecified: Secondary | ICD-10-CM

## 2013-08-11 DIAGNOSIS — Z9849 Cataract extraction status, unspecified eye: Secondary | ICD-10-CM

## 2013-08-11 DIAGNOSIS — C228 Malignant neoplasm of liver, primary, unspecified as to type: Secondary | ICD-10-CM | POA: Diagnosis present

## 2013-08-11 DIAGNOSIS — M549 Dorsalgia, unspecified: Secondary | ICD-10-CM | POA: Diagnosis present

## 2013-08-11 DIAGNOSIS — R51 Headache: Secondary | ICD-10-CM

## 2013-08-11 DIAGNOSIS — R7989 Other specified abnormal findings of blood chemistry: Secondary | ICD-10-CM

## 2013-08-11 DIAGNOSIS — N400 Enlarged prostate without lower urinary tract symptoms: Secondary | ICD-10-CM

## 2013-08-11 DIAGNOSIS — R011 Cardiac murmur, unspecified: Secondary | ICD-10-CM | POA: Diagnosis present

## 2013-08-11 DIAGNOSIS — I8510 Secondary esophageal varices without bleeding: Secondary | ICD-10-CM | POA: Diagnosis present

## 2013-08-11 DIAGNOSIS — G8929 Other chronic pain: Secondary | ICD-10-CM | POA: Diagnosis present

## 2013-08-11 DIAGNOSIS — D509 Iron deficiency anemia, unspecified: Secondary | ICD-10-CM

## 2013-08-11 DIAGNOSIS — K7290 Hepatic failure, unspecified without coma: Secondary | ICD-10-CM

## 2013-08-11 DIAGNOSIS — Z96659 Presence of unspecified artificial knee joint: Secondary | ICD-10-CM

## 2013-08-11 DIAGNOSIS — K7682 Hepatic encephalopathy: Secondary | ICD-10-CM

## 2013-08-11 DIAGNOSIS — R188 Other ascites: Secondary | ICD-10-CM | POA: Diagnosis present

## 2013-08-11 DIAGNOSIS — K7031 Alcoholic cirrhosis of liver with ascites: Secondary | ICD-10-CM

## 2013-08-11 DIAGNOSIS — R471 Dysarthria and anarthria: Secondary | ICD-10-CM | POA: Diagnosis present

## 2013-08-11 DIAGNOSIS — H5460 Unqualified visual loss, one eye, unspecified: Secondary | ICD-10-CM | POA: Diagnosis present

## 2013-08-11 DIAGNOSIS — IMO0002 Reserved for concepts with insufficient information to code with codable children: Secondary | ICD-10-CM

## 2013-08-11 DIAGNOSIS — R001 Bradycardia, unspecified: Secondary | ICD-10-CM

## 2013-08-11 DIAGNOSIS — Z79899 Other long term (current) drug therapy: Secondary | ICD-10-CM

## 2013-08-11 DIAGNOSIS — E782 Mixed hyperlipidemia: Secondary | ICD-10-CM

## 2013-08-11 DIAGNOSIS — G8190 Hemiplegia, unspecified affecting unspecified side: Principal | ICD-10-CM | POA: Diagnosis present

## 2013-08-11 DIAGNOSIS — E162 Hypoglycemia, unspecified: Secondary | ICD-10-CM

## 2013-08-11 DIAGNOSIS — Z951 Presence of aortocoronary bypass graft: Secondary | ICD-10-CM

## 2013-08-11 DIAGNOSIS — I4891 Unspecified atrial fibrillation: Secondary | ICD-10-CM | POA: Diagnosis present

## 2013-08-11 DIAGNOSIS — Z961 Presence of intraocular lens: Secondary | ICD-10-CM

## 2013-08-11 DIAGNOSIS — J309 Allergic rhinitis, unspecified: Secondary | ICD-10-CM

## 2013-08-11 DIAGNOSIS — G4730 Sleep apnea, unspecified: Secondary | ICD-10-CM | POA: Diagnosis present

## 2013-08-11 DIAGNOSIS — T887XXA Unspecified adverse effect of drug or medicament, initial encounter: Secondary | ICD-10-CM

## 2013-08-11 DIAGNOSIS — C220 Liver cell carcinoma: Secondary | ICD-10-CM

## 2013-08-11 DIAGNOSIS — I509 Heart failure, unspecified: Secondary | ICD-10-CM | POA: Diagnosis present

## 2013-08-11 DIAGNOSIS — G4733 Obstructive sleep apnea (adult) (pediatric): Secondary | ICD-10-CM | POA: Diagnosis present

## 2013-08-11 DIAGNOSIS — M109 Gout, unspecified: Secondary | ICD-10-CM

## 2013-08-11 DIAGNOSIS — I2510 Atherosclerotic heart disease of native coronary artery without angina pectoris: Secondary | ICD-10-CM | POA: Diagnosis present

## 2013-08-11 DIAGNOSIS — I5030 Unspecified diastolic (congestive) heart failure: Secondary | ICD-10-CM | POA: Diagnosis present

## 2013-08-11 DIAGNOSIS — E119 Type 2 diabetes mellitus without complications: Secondary | ICD-10-CM | POA: Diagnosis present

## 2013-08-11 DIAGNOSIS — G589 Mononeuropathy, unspecified: Secondary | ICD-10-CM

## 2013-08-11 DIAGNOSIS — Z794 Long term (current) use of insulin: Secondary | ICD-10-CM

## 2013-08-11 DIAGNOSIS — Z87891 Personal history of nicotine dependence: Secondary | ICD-10-CM

## 2013-08-11 DIAGNOSIS — M79602 Pain in left arm: Secondary | ICD-10-CM

## 2013-08-11 DIAGNOSIS — I359 Nonrheumatic aortic valve disorder, unspecified: Secondary | ICD-10-CM | POA: Diagnosis present

## 2013-08-11 DIAGNOSIS — K224 Dyskinesia of esophagus: Secondary | ICD-10-CM

## 2013-08-11 DIAGNOSIS — R609 Edema, unspecified: Secondary | ICD-10-CM

## 2013-08-11 DIAGNOSIS — E1151 Type 2 diabetes mellitus with diabetic peripheral angiopathy without gangrene: Secondary | ICD-10-CM

## 2013-08-11 DIAGNOSIS — H532 Diplopia: Secondary | ICD-10-CM | POA: Diagnosis present

## 2013-08-11 DIAGNOSIS — K7030 Alcoholic cirrhosis of liver without ascites: Secondary | ICD-10-CM | POA: Diagnosis present

## 2013-08-11 DIAGNOSIS — Z515 Encounter for palliative care: Secondary | ICD-10-CM

## 2013-08-11 DIAGNOSIS — Z85820 Personal history of malignant melanoma of skin: Secondary | ICD-10-CM

## 2013-08-11 DIAGNOSIS — D649 Anemia, unspecified: Secondary | ICD-10-CM | POA: Diagnosis present

## 2013-08-11 DIAGNOSIS — R9430 Abnormal result of cardiovascular function study, unspecified: Secondary | ICD-10-CM

## 2013-08-11 DIAGNOSIS — M1990 Unspecified osteoarthritis, unspecified site: Secondary | ICD-10-CM

## 2013-08-11 DIAGNOSIS — I878 Other specified disorders of veins: Secondary | ICD-10-CM

## 2013-08-11 DIAGNOSIS — I350 Nonrheumatic aortic (valve) stenosis: Secondary | ICD-10-CM

## 2013-08-11 DIAGNOSIS — E440 Moderate protein-calorie malnutrition: Secondary | ICD-10-CM

## 2013-08-11 DIAGNOSIS — K219 Gastro-esophageal reflux disease without esophagitis: Secondary | ICD-10-CM | POA: Diagnosis present

## 2013-08-11 DIAGNOSIS — R531 Weakness: Secondary | ICD-10-CM | POA: Diagnosis present

## 2013-08-11 DIAGNOSIS — L03115 Cellulitis of right lower limb: Secondary | ICD-10-CM

## 2013-08-11 LAB — CBC
HCT: 37.6 % — ABNORMAL LOW (ref 39.0–52.0)
Hemoglobin: 12.7 g/dL — ABNORMAL LOW (ref 13.0–17.0)
MCH: 34 pg (ref 26.0–34.0)
MCHC: 33.8 g/dL (ref 30.0–36.0)
MCV: 100.5 fL — ABNORMAL HIGH (ref 78.0–100.0)
Platelets: 50 10*3/uL — ABNORMAL LOW (ref 150–400)
RBC: 3.74 MIL/uL — ABNORMAL LOW (ref 4.22–5.81)
RDW: 16.4 % — AB (ref 11.5–15.5)
WBC: 4.6 10*3/uL (ref 4.0–10.5)

## 2013-08-11 LAB — URINALYSIS, ROUTINE W REFLEX MICROSCOPIC
BILIRUBIN URINE: NEGATIVE
Glucose, UA: NEGATIVE mg/dL
Hgb urine dipstick: NEGATIVE
KETONES UR: NEGATIVE mg/dL
Leukocytes, UA: NEGATIVE
Nitrite: NEGATIVE
PH: 5 (ref 5.0–8.0)
Protein, ur: NEGATIVE mg/dL
Specific Gravity, Urine: 1.015 (ref 1.005–1.030)
UROBILINOGEN UA: 0.2 mg/dL (ref 0.0–1.0)

## 2013-08-11 LAB — RAPID URINE DRUG SCREEN, HOSP PERFORMED
Amphetamines: NOT DETECTED
BARBITURATES: NOT DETECTED
BENZODIAZEPINES: NOT DETECTED
Cocaine: NOT DETECTED
Opiates: POSITIVE — AB
Tetrahydrocannabinol: NOT DETECTED

## 2013-08-11 LAB — COMPREHENSIVE METABOLIC PANEL
ALBUMIN: 2.9 g/dL — AB (ref 3.5–5.2)
ALT: 31 U/L (ref 0–53)
AST: 68 U/L — ABNORMAL HIGH (ref 0–37)
Alkaline Phosphatase: 238 U/L — ABNORMAL HIGH (ref 39–117)
BILIRUBIN TOTAL: 2.3 mg/dL — AB (ref 0.3–1.2)
BUN: 27 mg/dL — ABNORMAL HIGH (ref 6–23)
CALCIUM: 8.8 mg/dL (ref 8.4–10.5)
CHLORIDE: 100 meq/L (ref 96–112)
CO2: 27 mEq/L (ref 19–32)
CREATININE: 1.5 mg/dL — AB (ref 0.50–1.35)
GFR calc Af Amer: 51 mL/min — ABNORMAL LOW (ref >90)
GFR, EST NON AFRICAN AMERICAN: 44 mL/min — AB (ref >90)
Glucose, Bld: 165 mg/dL — ABNORMAL HIGH (ref 70–99)
Potassium: 5.2 mEq/L (ref 3.7–5.3)
SODIUM: 140 meq/L (ref 137–147)
Total Protein: 5.8 g/dL — ABNORMAL LOW (ref 6.0–8.3)

## 2013-08-11 LAB — DIFFERENTIAL
BASOS ABS: 0 10*3/uL (ref 0.0–0.1)
BASOS PCT: 1 % (ref 0–1)
Eosinophils Absolute: 0 10*3/uL (ref 0.0–0.7)
Eosinophils Relative: 0 % (ref 0–5)
Lymphocytes Relative: 11 % — ABNORMAL LOW (ref 12–46)
Lymphs Abs: 0.5 10*3/uL — ABNORMAL LOW (ref 0.7–4.0)
Monocytes Absolute: 0.6 10*3/uL (ref 0.1–1.0)
Monocytes Relative: 13 % — ABNORMAL HIGH (ref 3–12)
Neutro Abs: 3.5 10*3/uL (ref 1.7–7.7)
Neutrophils Relative %: 75 % (ref 43–77)

## 2013-08-11 LAB — I-STAT CHEM 8, ED
BUN: 32 mg/dL — AB (ref 6–23)
Calcium, Ion: 1.04 mmol/L — ABNORMAL LOW (ref 1.13–1.30)
Chloride: 105 mEq/L (ref 96–112)
Creatinine, Ser: 1.6 mg/dL — ABNORMAL HIGH (ref 0.50–1.35)
Glucose, Bld: 162 mg/dL — ABNORMAL HIGH (ref 70–99)
HCT: 39 % (ref 39.0–52.0)
Hemoglobin: 13.3 g/dL (ref 13.0–17.0)
POTASSIUM: 4.6 meq/L (ref 3.7–5.3)
SODIUM: 134 meq/L — AB (ref 137–147)
TCO2: 32 mmol/L (ref 0–100)

## 2013-08-11 LAB — APTT: APTT: 34 s (ref 24–37)

## 2013-08-11 LAB — CULTURE, BLOOD (ROUTINE X 2)
Culture: NO GROWTH
Culture: NO GROWTH

## 2013-08-11 LAB — I-STAT TROPONIN, ED: TROPONIN I, POC: 0.03 ng/mL (ref 0.00–0.08)

## 2013-08-11 LAB — PROTIME-INR
INR: 1.54 — ABNORMAL HIGH (ref 0.00–1.49)
PROTHROMBIN TIME: 18.1 s — AB (ref 11.6–15.2)

## 2013-08-11 LAB — GLUCOSE, CAPILLARY: Glucose-Capillary: 163 mg/dL — ABNORMAL HIGH (ref 70–99)

## 2013-08-11 LAB — AMMONIA: Ammonia: 41 umol/L (ref 11–60)

## 2013-08-11 LAB — ETHANOL: Alcohol, Ethyl (B): <11 mg/dL (ref 0–11)

## 2013-08-11 MED ORDER — LORATADINE 10 MG PO TABS
10.0000 mg | ORAL_TABLET | Freq: Every morning | ORAL | Status: DC
  Administered 2013-08-12: 10 mg via ORAL
  Filled 2013-08-11 (×2): qty 1

## 2013-08-11 MED ORDER — LACTULOSE 10 GM/15ML PO SOLN
20.0000 g | Freq: Two times a day (BID) | ORAL | Status: DC
  Administered 2013-08-12 (×2): 20 g via ORAL
  Filled 2013-08-11 (×4): qty 30

## 2013-08-11 MED ORDER — PANTOPRAZOLE SODIUM 40 MG PO TBEC
40.0000 mg | DELAYED_RELEASE_TABLET | Freq: Every day | ORAL | Status: DC
  Filled 2013-08-11: qty 1

## 2013-08-11 MED ORDER — RIFAXIMIN 550 MG PO TABS
550.0000 mg | ORAL_TABLET | Freq: Two times a day (BID) | ORAL | Status: DC
  Administered 2013-08-11 – 2013-08-12 (×2): 550 mg via ORAL
  Filled 2013-08-11 (×3): qty 1

## 2013-08-11 MED ORDER — PANTOPRAZOLE SODIUM 40 MG PO TBEC
40.0000 mg | DELAYED_RELEASE_TABLET | Freq: Every day | ORAL | Status: DC
  Administered 2013-08-12: 40 mg via ORAL

## 2013-08-11 MED ORDER — INSULIN ASPART 100 UNIT/ML ~~LOC~~ SOLN
0.0000 [IU] | Freq: Three times a day (TID) | SUBCUTANEOUS | Status: DC
  Administered 2013-08-12: 5 [IU] via SUBCUTANEOUS
  Administered 2013-08-12: 2 [IU] via SUBCUTANEOUS

## 2013-08-11 MED ORDER — MAGNESIUM OXIDE 400 (241.3 MG) MG PO TABS
400.0000 mg | ORAL_TABLET | Freq: Every day | ORAL | Status: DC
  Administered 2013-08-12: 400 mg via ORAL
  Filled 2013-08-11 (×2): qty 1

## 2013-08-11 MED ORDER — ISOSORBIDE MONONITRATE ER 60 MG PO TB24
60.0000 mg | ORAL_TABLET | Freq: Every day | ORAL | Status: DC
  Administered 2013-08-12: 60 mg via ORAL
  Filled 2013-08-11: qty 1

## 2013-08-11 MED ORDER — CALCIUM-MAGNESIUM-ZINC 1000-500-50 MG PO TABS: 1.0000 | ORAL_TABLET | Freq: Every morning | ORAL | Status: DC

## 2013-08-11 MED ORDER — METOCLOPRAMIDE HCL 5 MG PO TABS
5.0000 mg | ORAL_TABLET | Freq: Three times a day (TID) | ORAL | Status: DC
  Administered 2013-08-11 – 2013-08-12 (×3): 5 mg via ORAL
  Filled 2013-08-11 (×6): qty 1

## 2013-08-11 MED ORDER — INSULIN ASPART 100 UNIT/ML ~~LOC~~ SOLN: 15.0000 [IU] | Freq: Three times a day (TID) | SUBCUTANEOUS | Status: DC

## 2013-08-11 MED ORDER — SPIRONOLACTONE 50 MG PO TABS
50.0000 mg | ORAL_TABLET | Freq: Every day | ORAL | Status: DC
  Administered 2013-08-12: 50 mg via ORAL
  Filled 2013-08-11: qty 1

## 2013-08-11 MED ORDER — ZINC SULFATE 220 (50 ZN) MG PO CAPS
220.0000 mg | ORAL_CAPSULE | Freq: Every day | ORAL | Status: DC
  Administered 2013-08-12: 220 mg via ORAL
  Filled 2013-08-11: qty 1

## 2013-08-11 MED ORDER — VITAMIN B-1 100 MG PO TABS
100.0000 mg | ORAL_TABLET | Freq: Every day | ORAL | Status: DC
  Administered 2013-08-12: 100 mg via ORAL
  Filled 2013-08-11 (×2): qty 1

## 2013-08-11 MED ORDER — HYDROCODONE-ACETAMINOPHEN 5-325 MG PO TABS
1.0000 | ORAL_TABLET | ORAL | Status: DC | PRN
  Administered 2013-08-12: 1 via ORAL
  Filled 2013-08-11: qty 1

## 2013-08-11 MED ORDER — FENTANYL 50 MCG/HR TD PT72: 100.0000 ug | MEDICATED_PATCH | TRANSDERMAL | Status: DC

## 2013-08-11 MED ORDER — FUROSEMIDE 40 MG PO TABS
40.0000 mg | ORAL_TABLET | Freq: Two times a day (BID) | ORAL | Status: DC
  Administered 2013-08-11 – 2013-08-12 (×2): 40 mg via ORAL
  Filled 2013-08-11 (×4): qty 1

## 2013-08-11 MED ORDER — SENNOSIDES-DOCUSATE SODIUM 8.6-50 MG PO TABS: 1.0000 | ORAL_TABLET | Freq: Every evening | ORAL | Status: DC | PRN

## 2013-08-11 MED ORDER — CALCIUM CARBONATE 1250 (500 CA) MG PO TABS
1.0000 | ORAL_TABLET | Freq: Every day | ORAL | Status: DC
  Administered 2013-08-12: 500 mg via ORAL
  Filled 2013-08-11 (×2): qty 1

## 2013-08-11 MED ORDER — NITROGLYCERIN 0.4 MG SL SUBL: 0.4000 mg | SUBLINGUAL_TABLET | SUBLINGUAL | Status: DC | PRN

## 2013-08-11 MED ORDER — PREDNISONE 5 MG PO TABS
5.0000 mg | ORAL_TABLET | Freq: Every day | ORAL | Status: DC
  Administered 2013-08-12: 5 mg via ORAL
  Filled 2013-08-11 (×2): qty 1

## 2013-08-11 MED ORDER — TAMSULOSIN HCL 0.4 MG PO CAPS
0.4000 mg | ORAL_CAPSULE | Freq: Every day | ORAL | Status: DC
  Administered 2013-08-12: 0.4 mg via ORAL
  Filled 2013-08-11 (×2): qty 1

## 2013-08-11 MED ORDER — HYOSCYAMINE SULFATE 0.125 MG PO TBDP
0.1250 mg | ORAL_TABLET | ORAL | Status: DC | PRN
  Filled 2013-08-11: qty 1

## 2013-08-11 MED ORDER — VITAMIN C 500 MG PO TABS
500.0000 mg | ORAL_TABLET | Freq: Every day | ORAL | Status: DC
  Administered 2013-08-12: 500 mg via ORAL
  Filled 2013-08-11 (×2): qty 1

## 2013-08-11 MED ORDER — TRIAMCINOLONE ACETONIDE 0.1 % EX CREA
1.0000 "application " | TOPICAL_CREAM | Freq: Every day | CUTANEOUS | Status: DC
  Administered 2013-08-11 – 2013-08-12 (×2): 1 via TOPICAL
  Filled 2013-08-11: qty 15

## 2013-08-11 MED ORDER — OMEPRAZOLE MAGNESIUM 20 MG PO TBEC: 20.0000 mg | DELAYED_RELEASE_TABLET | Freq: Every day | ORAL | Status: DC

## 2013-08-11 MED ORDER — POLYETHYLENE GLYCOL 3350 17 G PO PACK: 17.0000 g | PACK | Freq: Every day | ORAL | Status: DC | PRN

## 2013-08-11 MED ORDER — METOPROLOL TARTRATE 12.5 MG HALF TABLET
12.5000 mg | ORAL_TABLET | Freq: Two times a day (BID) | ORAL | Status: DC
  Administered 2013-08-11 – 2013-08-12 (×2): 12.5 mg via ORAL
  Filled 2013-08-11 (×4): qty 1

## 2013-08-11 MED ORDER — ONDANSETRON HCL 4 MG PO TABS: 8.0000 mg | ORAL_TABLET | Freq: Two times a day (BID) | ORAL | Status: DC | PRN

## 2013-08-11 MED ORDER — ALLOPURINOL 150 MG HALF TABLET
150.0000 mg | ORAL_TABLET | Freq: Every morning | ORAL | Status: DC
  Administered 2013-08-12: 150 mg via ORAL
  Filled 2013-08-11: qty 1

## 2013-08-11 MED ORDER — FERROUS GLUCONATE 324 (38 FE) MG PO TABS
324.0000 mg | ORAL_TABLET | Freq: Two times a day (BID) | ORAL | Status: DC
  Administered 2013-08-11 – 2013-08-12 (×2): 324 mg via ORAL
  Filled 2013-08-11 (×3): qty 1

## 2013-08-11 MED ORDER — VANCOMYCIN 50 MG/ML ORAL SOLUTION
125.0000 mg | Freq: Four times a day (QID) | ORAL | Status: DC
  Administered 2013-08-12 (×3): 125 mg via ORAL
  Filled 2013-08-11 (×6): qty 2.5

## 2013-08-11 NOTE — ED Notes (Signed)
Pt from home.  LSN 1430.  Pt was ambulating with walker and his R leg gave out.  EMS reports R facial droop and R arm drift.

## 2013-08-11 NOTE — ED Notes (Signed)
Pt in MRI.

## 2013-08-11 NOTE — Consult Note (Signed)
Referring Physician: ED    Chief Complaint: CODE STROKE: RIGHT HEMIPARESIS  HPI:                                                                                                                                         [name] is an 78 y.o. male with a past medical history significant for HTN, DM, atrial fibrillation, CAD, chronic congestive heart failure, liver cancer, hepatic encephalopathy, GI bleeding secondary to esophageal varices, thrombocytopenia, brought in by EMS as a code stroke due to acute onset right hemiparesis. He was home walking with his walker when suddenly the right leg gave away and then the right arm also became weak. EMS was called and patient brought to the ED. Initial NIHSS 6. CT brain showed no acute abnormality. He said that he has been doing poorly in the past couple of days, got out of the hospital recently. Complains of HA but denies vertigo, double vision, difficulty swallowing, or visual disturbances. He is a DNR. Date last known well: 08/11/13  Time last known well: 230 pm  tPA Given: no, patient critically ill with advanced liver cancer,esophageal varices with prior GI bleeding, and thrombocytopenia and thus he is not a proper candidate for thrombolysis.  NIHSS: 6   Past Medical History  Diagnosis Date  . Diverticulosis   . Gastric antral vascular ectasia   . Rhinophyma   . CAD (coronary artery disease)     myoview 2007 no ischemia/ no ASA because of GI disease  . Atrial fibrillation     amiodarone in past, but problems and left off meds.  . Aortic stenosis     echo, September, 2010, mild aortic insufficiency  . Visual loss, one eye     right eye-ophthalmic arterial branch occlusion  . Internal hemorrhoids   . GERD (gastroesophageal reflux disease)   . Hypertension   . Allergic rhinitis   . Anemia     multifactorial  . Cirrhosis, alcoholic   . Thrombocytopenia   . Bleeding esophageal varices   . Encephalopathy, unspecified   . Gout   .  Short-segment Barrett's esophagus     suspected  . Venous insufficiency   . Ejection fraction     EF 55%, echo, September, 2010  . Carotid bruit     Dopplers okay in the past  . Cellulitis     recurrent to RLE/notes 06/21/2012  . CHF (congestive heart failure)   . Sinus bradycardia     January, 2014  . Diastolic CHF     January, 2014  . Complication of anesthesia     "quit breathing once a long time ago; dropped my BP too; it was after I was given Dilaudid" (06/21/2012)  . Heart murmur   . Anginal pain   . Shortness of breath     "stay that way most of the time now" (06/21/2012)  . OSA  on CPAP   . Diabetes mellitus, type 2   . History of blood transfusion     "not related to OR" (06/21/2012)  . H/O hiatal hernia   . Osteoarthritis     "ALL OVER" (06/21/2012)  . Chronic lower back pain   . Aspiration into airway-silent on modified barium swallow 03/20/2013  . Melanoma     resected by [facility] 2008  . Skin cancer     Basal cell and squamous cell  . Esophageal dysmotility 10/09/2012    Past Surgical History  Procedure Laterality Date  . Total knee arthroplasty Right   . Esophagogastroduodenoscopy  04/10/2008    esophageal varices, cardia varix  . Tips procedure  2/10  . Back surgery      X5 "for ruptured disc; last time did a fusion" (06/21/2012)  . Excisional hemorrhoidectomy    . Release scar contracture / graft repairs of hand      bilaterally   . Elbow surgery Right   . Hiatal hernia repair    . Colonoscopy w/ polypectomy  02/14/2007    adenomatous polyps, diverticulosis, internal hemorrhoids/rectal varices  . Cataract extraction, bilateral  January 2013  . Tonsillectomy    . Appendectomy    . Joint replacement    . Coronary artery bypass graft  1993    6 Bypass  . Cataract extraction w/ intraocular lens  implant, bilateral      Family History  Problem Relation Age of Onset  . Alzheimer's disease Mother   . Cancer Father     GI (stomach?)  . Heart disease Sister     2  sisters  . Liver disease Sister   . Diabetes Brother   . Heart disease Brother     3 brothers  . Liver disease Brother   . Heart attack Other     sibling  . Melanoma Other     sibling  . Stroke Other     sibling  . Colon cancer Neg Hx    Social History:  reports that he quit smoking about 38 years ago. His smoking use included Cigarettes. He has a 40 pack-year smoking history. He has never used smokeless tobacco. He reports that he does not drink alcohol or use illicit drugs.  Allergies:  Allergies  Allergen Reactions  . Shellfish-Derived Products     Rash & angioedema  . Doxycycline Hyclate     Diarrhea   . Amoxicillin-Pot Clavulanate     Unsure what reaction was but pt tolerated penicillin 05/25/2012  . Aspirin     REACTION: unable to tolerate due to cirrhosis and varices  . Buprenorphine Hcl-Naloxone Hcl Other (See Comments)    unresponsive  . Codeine     Rash    . Ezetimibe     REACTION: LEG/JOINT PAIN  . Hydromorphone Hcl     Per wife "pt developed respiratory distress"  . Meperidine Hcl     Per wife "his blood pressure dropped."  . Morphine     Per wife "pt sees spider webs."  . Morphine Sulfate     REACTION: unspecified  . Oxycodone     hallucinations  . Sulfonamide Derivatives Nausea And Vomiting    Medications:  I have reviewed the patient's current medications.  ROS:                                                                                                                                       History obtained from chart review and patient  General ROS: negative for - chills, fever, night sweats. Significant for fatigue, weight gain. Psychological ROS: negative for - behavioral disorder, hallucinations, memory difficulties, mood swings or suicidal ideation Ophthalmic ROS: negative for - blurry vision, double vision, eye pain or  loss of vision ENT ROS: negative for - epistaxis, nasal discharge, oral lesions, sore throat, tinnitus or vertigo Allergy and Immunology ROS: negative for - hives or itchy/watery eyes Hematological and Lymphatic ROS: negative for - bleeding problems, bruising or swollen lymph nodes Endocrine ROS: negative for - galactorrhea, hair pattern changes, polydipsia/polyuria or temperature intolerance Respiratory ROS: negative for - cough, hemoptysis, shortness of breath or wheezing Cardiovascular ROS: negative for - chest pain, dyspnea on exertion, edema or irregular heartbeat Gastrointestinal ROS: negative for - abdominal pain, diarrhea, hematemesis, nausea/vomiting or stool incontinence Genito-Urinary ROS: negative for - dysuria, hematuria, incontinence or urinary frequency/urgency Musculoskeletal ROS: significant for LE swelling and generalized muscular weakness Neurological ROS: as noted in HPI Dermatological ROS: negative for rash  Physical exam: chronically ill male in no apparent distress. Pulse 68, resp. rate 12, SpO2 93.00%. Head: normocephalic. Neck: supple, no bruits, no JVD. Cardiac: no murmurs. Lungs: clear. Abdomen: distended Extremities: marked bilateral LE edema. Neurologic Examination:                                                                                                      Mental Status: Alert, oriented, thought content appropriate. Mild dysarthria.  Able to follow 3 step commands without difficulty. Cranial Nerves: II: Discs flat bilaterally; Visual fields grossly normal, pupils equal, round, reactive to light and accommodation III,IV, VI: ptosis not present, extra-ocular motions intact bilaterally V,VII: smile symmetric, facial light touch sensation normal bilaterally VIII: hearing normal bilaterally IX,X: gag reflex present XI: bilateral shoulder shrug XII: midline tongue extension without atrophy or fasciculations Motor: Bilateral LE weakness, but patient has  marked edema bilaterally that could be causing the weakness. Tone and bulk:normal tone throughout; no atrophy noted Sensory: Pinprick and light touch intact throughout, bilaterally Deep Tendon Reflexes:  1 all over Plantars: Right: downgoing   Left: downgoing Cerebellar: Bilateral resting and kinetic tremor Gait: Unable to test.  Results for orders placed during the hospital encounter  of 08/11/13 (from the past 48 hour(s))  CBC     Status: Abnormal (Preliminary result)   Collection Time    08/11/13  5:10 PM      Result Value Ref Range   WBC 4.6  4.0 - 10.5 K/uL   RBC 3.74 (*) 4.22 - 5.81 MIL/uL   Hemoglobin 12.7 (*) 13.0 - 17.0 g/dL   HCT 37.6 (*) 39.0 - 52.0 %   MCV 100.5 (*) 78.0 - 100.0 fL   MCH 34.0  26.0 - 34.0 pg   MCHC 33.8  30.0 - 36.0 g/dL   RDW 16.4 (*) 11.5 - 15.5 %   Platelets PENDING  150 - 400 K/uL  DIFFERENTIAL     Status: Abnormal   Collection Time    08/11/13  5:10 PM      Result Value Ref Range   Neutrophils Relative % 75  43 - 77 %   Neutro Abs 3.5  1.7 - 7.7 K/uL   Lymphocytes Relative 11 (*) 12 - 46 %   Lymphs Abs 0.5 (*) 0.7 - 4.0 K/uL   Monocytes Relative 13 (*) 3 - 12 %   Monocytes Absolute 0.6  0.1 - 1.0 K/uL   Eosinophils Relative 0  0 - 5 %   Eosinophils Absolute 0.0  0.0 - 0.7 K/uL   Basophils Relative 1  0 - 1 %   Basophils Absolute 0.0  0.0 - 0.1 K/uL  I-STAT CHEM 8, ED     Status: Abnormal   Collection Time    08/11/13  5:19 PM      Result Value Ref Range   Sodium 134 (*) 137 - 147 mEq/L   Potassium 4.6  3.7 - 5.3 mEq/L   Chloride 105  96 - 112 mEq/L   BUN 32 (*) 6 - 23 mg/dL   Creatinine, Ser 1.60 (*) 0.50 - 1.35 mg/dL   Glucose, Bld 162 (*) 70 - 99 mg/dL   Calcium, Ion 1.04 (*) 1.13 - 1.30 mmol/L   TCO2 32  0 - 100 mmol/L   Hemoglobin 13.3  13.0 - 17.0 g/dL   HCT 39.0  39.0 - 52.0 %   Ct Head Wo Contrast  08/11/2013   CLINICAL DATA:  Code stroke. Right-sided facial droop and right arm drift.  EXAM: CT HEAD WITHOUT CONTRAST   TECHNIQUE: Contiguous axial images were obtained from the base of the skull through the vertex without intravenous contrast.  COMPARISON:  Head CT 08/02/2013.  FINDINGS: Mild cerebral atrophy. Patchy areas of mild decreased attenuation are noted throughout the deep and periventricular white matter of the cerebral hemispheres bilaterally, compatible with mild chronic microvascular ischemic disease. No acute intracranial abnormalities. Specifically, no evidence of acute intracranial hemorrhage, no definite findings of acute/subacute cerebral ischemia, no mass, mass effect, hydrocephalus or abnormal intra or extra-axial fluid collections. Visualized paranasal sinuses and mastoids are well pneumatized. No acute displaced skull fractures are identified.  IMPRESSION: 1. No acute intracranial abnormalities. 2. Mild cerebral atrophy with mild chronic microvascular ischemic changes in the cerebral white matter. These results were called by telephone at the time of interpretation on 08/11/2013 at 5:25 PM to Dr. Armida Sans, who verbally acknowledged these results.   Electronically Signed   By: Vinnie Langton M.D.   On: 08/11/2013 17:25    Assessment: 78 y.o. male brought in with acute onset right sided weakness. NIHSS 6 but exam confounded by significant edema lower extremities that precludes reliable muscle testing. CT brain unremarkable for acute abnormality.  He has advanced liver cancer, esophageal varices with prior GI bleeding, and thrombocytopenia and therefore is not an appropriate candidate for thrombolysis.  Will suggest admission to medicine. MRI brain. Will follow up.  Stroke Risk Factors - age,  HTN, DM, atrial fibrillation, CAD, congestive heart failure  Dorian Pod, MD [facility] [phone]  08/11/2013, 5:36 PM

## 2013-08-11 NOTE — H&P (Signed)
[facility] History and Physical  Samad R Steedman STM:[id number] DOB: [birth year] DOA: 08/11/2013  Referring physician: ER physician. PCP: Unice Cobble, MD  Specialists: Dr. Carlean Purl. Gastroenterologist.  Chief Complaint:  Right sided weakness.  HPI: [name] is a 78 y.o. male with history of hepatocellular carcinoma status post radiation and seeding(March and April this year), cirrhosis of liver, OSA, diabetes mellitus, atrial fibrillation, CAD, recurrent lower extremity cellulitis who was recently admitted to the hospital for hepatitic encephalopathy and hypoglycemia and also was found to have C. difficile colitis started experiencing difficulty walking since morning after he woke up. At around 2:30 PM patient stumbled and fell but her son was able to catch him before he hit the ground. Since then patient has been experiencing right-sided weakness. Initially it was both upper and lower extremity but now it is only right lower extremity. Patient at that moment also had some dysarthria and right facial droop. Patient's right facial droop resolved after a few minutes. Patient also had mild diplopia at that time. Presently on exam patient has 3 x 5 strength in right lower extremity. Rest of the extremity patient is able to move all. Denies any headache difficulty swallowing and patient's speech is improved. Denies any chest pain shortness of breath nausea vomiting abdominal pain diarrhea. Patient was recent diagnosed with C. difficile and since starting vancomycin diet has improved.  Review of Systems: As presented in the history of presenting illness, rest negative.  Past Medical History  Diagnosis Date  . Diverticulosis   . Gastric antral vascular ectasia   . Rhinophyma   . CAD (coronary artery disease)     myoview 2007 no ischemia/ no ASA because of GI disease  . Atrial fibrillation     amiodarone in past, but problems and left off meds.  . Aortic stenosis     echo, September, 2010,  mild aortic insufficiency  . Visual loss, one eye     right eye-ophthalmic arterial branch occlusion  . Internal hemorrhoids   . GERD (gastroesophageal reflux disease)   . Hypertension   . Allergic rhinitis   . Anemia     multifactorial  . Cirrhosis, alcoholic   . Thrombocytopenia   . Bleeding esophageal varices   . Encephalopathy, unspecified   . Gout   . Short-segment Barrett's esophagus     suspected  . Venous insufficiency   . Ejection fraction     EF 55%, echo, September, 2010  . Carotid bruit     Dopplers okay in the past  . Cellulitis     recurrent to RLE/notes 06/21/2012  . CHF (congestive heart failure)   . Sinus bradycardia     January, 2014  . Diastolic CHF     January, 2014  . Complication of anesthesia     "quit breathing once a long time ago; dropped my BP too; it was after I was given Dilaudid" (06/21/2012)  . Heart murmur   . Anginal pain   . Shortness of breath     "stay that way most of the time now" (06/21/2012)  . OSA on CPAP   . Diabetes mellitus, type 2   . History of blood transfusion     "not related to OR" (06/21/2012)  . H/O hiatal hernia   . Osteoarthritis     "ALL OVER" (06/21/2012)  . Chronic lower back pain   . Aspiration into airway-silent on modified barium swallow 03/20/2013  . Melanoma     resected by [facility] 2008  .  Skin cancer     Basal cell and squamous cell  . Esophageal dysmotility 10/09/2012   Past Surgical History  Procedure Laterality Date  . Total knee arthroplasty Right   . Esophagogastroduodenoscopy  04/10/2008    esophageal varices, cardia varix  . Tips procedure  2/10  . Back surgery      X5 "for ruptured disc; last time did a fusion" (06/21/2012)  . Excisional hemorrhoidectomy    . Release scar contracture / graft repairs of hand      bilaterally   . Elbow surgery Right   . Hiatal hernia repair    . Colonoscopy w/ polypectomy  02/14/2007    adenomatous polyps, diverticulosis, internal hemorrhoids/rectal varices  . Cataract  extraction, bilateral  January 2013  . Tonsillectomy    . Appendectomy    . Joint replacement    . Coronary artery bypass graft  1993    6 Bypass  . Cataract extraction w/ intraocular lens  implant, bilateral     Social History:  reports that he quit smoking about 38 years ago. His smoking use included Cigarettes. He has a 40 pack-year smoking history. He has never used smokeless tobacco. He reports that he does not drink alcohol or use illicit drugs. Where does patient live home. Can patient participate in ADLs? Yes.  Allergies  Allergen Reactions  . Shellfish-Derived Products     Rash & angioedema  . Doxycycline Hyclate     Diarrhea   . Amoxicillin-Pot Clavulanate     Unsure what reaction was but pt tolerated penicillin 05/25/2012  . Aspirin     REACTION: unable to tolerate due to cirrhosis and varices  . Buprenorphine Hcl-Naloxone Hcl Other (See Comments)    unresponsive  . Codeine     Rash    . Ezetimibe     REACTION: LEG/JOINT PAIN  . Hydromorphone Hcl     Per wife "pt developed respiratory distress"  . Meperidine Hcl     Per wife "his blood pressure dropped."  . Morphine     Per wife "pt sees spider webs."  . Morphine Sulfate     REACTION: unspecified  . Oxycodone     hallucinations  . Sulfonamide Derivatives Nausea And Vomiting    Family History:  Family History  Problem Relation Age of Onset  . Alzheimer's disease Mother   . Cancer Father     GI (stomach?)  . Heart disease Sister     2 sisters  . Liver disease Sister   . Diabetes Brother   . Heart disease Brother     3 brothers  . Liver disease Brother   . Heart attack Other     sibling  . Melanoma Other     sibling  . Stroke Other     sibling  . Colon cancer Neg Hx       Prior to Admission medications   Medication Sig Start Date End Date Taking? Authorizing Provider  allopurinol (ZYLOPRIM) 300 MG tablet Take 150 mg by mouth every morning.    Yes Historical Provider, MD  Ascorbic Acid (VITAMIN  C) 500 MG tablet Take 500 mg by mouth daily.     Yes Historical Provider, MD  Calcium-Magnesium-Zinc (CALCIUM/MAGNESIUM/ZINC FORMULA) 1000-500-50 MG TABS Take 1 tablet by mouth every morning.    Yes Historical Provider, MD  fentaNYL (DURAGESIC - DOSED MCG/HR) 100 MCG/HR Place 100 mcg onto the skin every 3 (three) days.   Yes Historical Provider, MD  Ferrous Gluconate (IRON) 240 (  27 FE) MG TABS Take 1 tablet by mouth 2 (two) times daily.    Yes Historical Provider, MD  furosemide (LASIX) 40 MG tablet Take 1 tablet (40 mg total) by mouth 2 (two) times daily. 03/29/13  Yes Gatha Mayer, MD  HYDROcodone-acetaminophen (NORCO/VICODIN) 5-325 MG per tablet Take 1-2 tablets by mouth every 4 (four) hours as needed for moderate pain. 08/06/13  Yes Geradine Girt, DO  hyoscyamine (ANASPAZ) 0.125 MG TBDP disintergrating tablet Place 0.125 mg under the tongue every 4 (four) hours as needed for bladder spasms or cramping.    Yes Historical Provider, MD  insulin aspart (NOVOLOG) 100 UNIT/ML injection Inject 15 Units into the skin 3 (three) times daily with meals. IF PATIENT EATS >50% of meals 08/06/13  Yes Jessica U Vann, DO  isosorbide mononitrate (IMDUR) 60 MG 24 hr tablet Take 60 mg by mouth daily.   Yes Historical Provider, MD  lactulose (CHRONULAC) 10 GM/15ML solution Take 30 mLs (20 g total) by mouth 2 (two) times daily. 03/29/13  Yes Gatha Mayer, MD  loratadine (CLARITIN) 10 MG tablet Take 10 mg by mouth every morning.    Yes Historical Provider, MD  metoCLOPramide (REGLAN) 5 MG tablet Take 1 tablet (5 mg total) by mouth 4 (four) times daily -  before meals and at bedtime. 08/06/13  Yes Geradine Girt, DO  metoprolol tartrate (LOPRESSOR) 25 MG tablet Take 0.5 tablets (12.5 mg total) by mouth 2 (two) times daily. 04/22/13  Yes Hendricks Limes, MD  omeprazole (PRILOSEC OTC) 20 MG tablet Take 20 mg by mouth daily.   Yes Historical Provider, MD  ondansetron (ZOFRAN) 8 MG tablet Take 1 tablet (8 mg total) by mouth  every 12 (twelve) hours as needed for nausea or vomiting. 08/06/13  Yes Geradine Girt, DO  pantoprazole (PROTONIX) 40 MG tablet Take 1 tablet (40 mg total) by mouth daily. 08/06/13  Yes Geradine Girt, DO  polyethylene glycol (MIRALAX / GLYCOLAX) packet Take 17 g by mouth daily as needed. Take two or three times a week   Yes Historical Provider, MD  predniSONE (DELTASONE) 5 MG tablet Take 5 mg by mouth daily.    Yes Historical Provider, MD  rifaximin (XIFAXAN) 550 MG TABS tablet Take 550 mg by mouth 2 (two) times daily.   Yes Historical Provider, MD  spironolactone (ALDACTONE) 50 MG tablet Take 1 tablet (50 mg total) by mouth daily. 03/29/13  Yes Gatha Mayer, MD  tamsulosin (FLOMAX) 0.4 MG CAPS capsule Take 1 capsule (0.4 mg total) by mouth daily. 08/06/13  Yes Geradine Girt, DO  Thiamine HCl (VITAMIN B-1) 250 MG tablet Take 125 mg by mouth daily.    Yes Historical Provider, MD  triamcinolone cream (KENALOG) 0.1 % Apply 1 application topically daily.   Yes Historical Provider, MD  vancomycin (VANCOCIN) 50 mg/mL oral solution Take 2.5 mLs (125 mg total) by mouth 4 (four) times daily. 08/06/13  Yes Geradine Girt, DO  Insulin Syringe-Needle U-100 (INSULIN SYRINGE .5CC/30GX1/2") 30G X 1/2" 0.5 ML MISC Use 3 syringes per day 08/07/13   Elayne Snare, MD  nitroGLYCERIN (NITROSTAT) 0.4 MG SL tablet Place 1 tablet (0.4 mg total) under the tongue every 5 (five) minutes as needed for chest pain. 05/27/12   Sheila Oats, MD    Physical Exam: Filed Vitals:   08/11/13 1914 08/11/13 1930 08/11/13 2000 08/11/13 2015  BP: 119/60 115/56 125/56 139/61  Pulse: 67 63 62 61  Resp: 13  15 11 18   SpO2: 97% 96% 95% 98%     General:  Well-developed and moderately nourished.  Eyes: Anicteric no pallor.  ENT: No discharge from the ears eyes nose mouth.  Neck: No mass felt.  Cardiovascular: S1-S2 heard. Systolic heart murmur.  Respiratory: No rhonchi or crepitations.  Abdomen: Mildly distended nontender bowel  sounds present.  Skin: Chronic skin changes of the lower extremity.  Musculoskeletal: Bilateral lower extremity edema.  Psychiatric: Appears normal.  Neurologic: Alert awake oriented to time place and person. Right lower extremity is 3 x 5 strength. Rest of the extremities are 5 x 5 in strength. No facial asymmetry. Tongue is midline. PERRLA posterior.  Labs on Admission:  Basic Metabolic Panel:  Recent Labs Lab 08/05/13 0549 08/11/13 1710 08/11/13 1719  NA 138 140 134*  K 4.4 5.2 4.6  CL 97 100 105  CO2 31 27  --   GLUCOSE 180* 165* 162*  BUN 20 27* 32*  CREATININE 1.14 1.50* 1.60*  CALCIUM 9.4 8.8  --    Liver Function Tests:  Recent Labs Lab 08/11/13 1710  AST 68*  ALT 31  ALKPHOS 238*  BILITOT 2.3*  PROT 5.8*  ALBUMIN 2.9*    Recent Labs Lab 08/05/13 1433  LIPASE 18    Recent Labs Lab 08/11/13 1813  AMMONIA 41   CBC:  Recent Labs Lab 08/05/13 0549 08/11/13 1710 08/11/13 1719  WBC 3.7* 4.6  --   NEUTROABS  --  3.5  --   HGB 12.2* 12.7* 13.3  HCT 37.6* 37.6* 39.0  MCV 100.8* 100.5*  --   PLT 40* 50*  --    Cardiac Enzymes: No results found for this basename: CKTOTAL, CKMB, CKMBINDEX, TROPONINI,  in the last 168 hours  BNP (last 3 results)  Recent Labs  08/02/13 0245  PROBNP 795.6*   CBG:  Recent Labs Lab 08/05/13 1711 08/05/13 2120 08/06/13 0806 08/06/13 1148 08/06/13 1730  GLUCAP 258* 200* 163* 162* 80    Radiological Exams on Admission: Ct Head Wo Contrast  08/11/2013   CLINICAL DATA:  Code stroke. Right-sided facial droop and right arm drift.  EXAM: CT HEAD WITHOUT CONTRAST  TECHNIQUE: Contiguous axial images were obtained from the base of the skull through the vertex without intravenous contrast.  COMPARISON:  Head CT 08/02/2013.  FINDINGS: Mild cerebral atrophy. Patchy areas of mild decreased attenuation are noted throughout the deep and periventricular white matter of the cerebral hemispheres bilaterally, compatible with  mild chronic microvascular ischemic disease. No acute intracranial abnormalities. Specifically, no evidence of acute intracranial hemorrhage, no definite findings of acute/subacute cerebral ischemia, no mass, mass effect, hydrocephalus or abnormal intra or extra-axial fluid collections. Visualized paranasal sinuses and mastoids are well pneumatized. No acute displaced skull fractures are identified.  IMPRESSION: 1. No acute intracranial abnormalities. 2. Mild cerebral atrophy with mild chronic microvascular ischemic changes in the cerebral white matter. These results were called by telephone at the time of interpretation on 08/11/2013 at 5:25 PM to Dr. Armida Sans, who verbally acknowledged these results.   Electronically Signed   By: Vinnie Langton M.D.   On: 08/11/2013 17:25    EKG: Independently reviewed. Monitor shows sinus rhythm at this time.  Assessment/Plan Principal Problem:   Right sided weakness Active Problems:   THROMBOCYTOPENIA, CHRONIC   CIRRHOSIS, ALCOHOLIC   SLEEP APNEA    ASCITES   CORONARY ARTERY BYPASS GRAFT, HX OF   DM2 (diabetes mellitus, type 2)   1. Right-sided weakness - concerning  for stroke with patient having risk factors including atrial fibrillation diabetes mellitus. Presently MRI brain has been done and results are pending. Place patient also evaluation and neurochecks, physical therapy consult and further recommendations per neurologist. 2. Cirrhosis of the liver with ascites - patient may need therapeutic paracentesis in a.m. Continue with Lasix and spironolactone and patient is on rifaximin. Closely follow metabolic panel intake output. Continue lactulose. 3. Diabetes mellitus2 - family states that since discharge there has been no further hypoglycemic episodes. Closely follow CBGs. 4. CAD status post CABG - denies any chest pain. 5. Atrial fibrillation - rate controlled. 6. Sleep apnea - CPAP per respiratory. 7. Chronic anemia and thrombocytopenia - follow  CBC. 8. Gout - on prednisone and allopurinol.  Patient does have large systolic heart murmur for which 2-D echo has been ordered.  Code Status: Full code.  Family Communication: Family at the bedside.  Disposition Plan: Admit to inpatient.    Amiria Orrison N. [facility] Pager [phone].  If 7PM-7AM, please contact night-coverage [email or web address] Password [facility] 08/11/2013, 8:24 PM      \

## 2013-08-11 NOTE — ED Notes (Signed)
Pt returned from MRI; A&Ox3; no signs of distress.

## 2013-08-11 NOTE — ED Notes (Signed)
Pt monitored by 12-lead, bp cuff, and pulse ox. 

## 2013-08-11 NOTE — ED Provider Notes (Signed)
CSN: [id number]     Arrival date & time 08/11/13  1707 History   None    Chief Complaint  Patient presents with  . Code Stroke     (Consider location/radiation/quality/duration/timing/severity/associated sxs/prior Treatment) Patient is a 78 y.o. male presenting with neurologic complaint.  Neurologic Problem This is a new problem. The current episode started today. The problem occurs constantly. The problem has been gradually improving. Associated symptoms include weakness. Pertinent negatives include no abdominal pain, chest pain, chills, congestion, coughing, fever, headaches, nausea, neck pain, numbness or vomiting. Nothing aggravates the symptoms. He has tried nothing for the symptoms. The treatment provided no relief.    Past Medical History  Diagnosis Date  . Diverticulosis   . Gastric antral vascular ectasia   . Rhinophyma   . CAD (coronary artery disease)     myoview 2007 no ischemia/ no ASA because of GI disease  . Atrial fibrillation     amiodarone in past, but problems and left off meds.  . Aortic stenosis     echo, September, 2010, mild aortic insufficiency  . Visual loss, one eye     right eye-ophthalmic arterial branch occlusion  . Internal hemorrhoids   . GERD (gastroesophageal reflux disease)   . Hypertension   . Allergic rhinitis   . Anemia     multifactorial  . Cirrhosis, alcoholic   . Thrombocytopenia   . Bleeding esophageal varices   . Encephalopathy, unspecified   . Gout   . Short-segment Barrett's esophagus     suspected  . Venous insufficiency   . Ejection fraction     EF 55%, echo, September, 2010  . Carotid bruit     Dopplers okay in the past  . Cellulitis     recurrent to RLE/notes 06/21/2012  . CHF (congestive heart failure)   . Sinus bradycardia     January, 2014  . Diastolic CHF     January, 2014  . Complication of anesthesia     "quit breathing once a long time ago; dropped my BP too; it was after I was given Dilaudid" (06/21/2012)  .  Heart murmur   . Anginal pain   . Shortness of breath     "stay that way most of the time now" (06/21/2012)  . OSA on CPAP   . Diabetes mellitus, type 2   . History of blood transfusion     "not related to OR" (06/21/2012)  . H/O hiatal hernia   . Osteoarthritis     "ALL OVER" (06/21/2012)  . Chronic lower back pain   . Aspiration into airway-silent on modified barium swallow 03/20/2013  . Melanoma     resected by [facility] 2008  . Skin cancer     Basal cell and squamous cell  . Esophageal dysmotility 10/09/2012   Past Surgical History  Procedure Laterality Date  . Total knee arthroplasty Right   . Esophagogastroduodenoscopy  04/10/2008    esophageal varices, cardia varix  . Tips procedure  2/10  . Back surgery      X5 "for ruptured disc; last time did a fusion" (06/21/2012)  . Excisional hemorrhoidectomy    . Release scar contracture / graft repairs of hand      bilaterally   . Elbow surgery Right   . Hiatal hernia repair    . Colonoscopy w/ polypectomy  02/14/2007    adenomatous polyps, diverticulosis, internal hemorrhoids/rectal varices  . Cataract extraction, bilateral  January 2013  . Tonsillectomy    . Appendectomy    .  Joint replacement    . Coronary artery bypass graft  1993    6 Bypass  . Cataract extraction w/ intraocular lens  implant, bilateral     Family History  Problem Relation Age of Onset  . Alzheimer's disease Mother   . Cancer Father     GI (stomach?)  . Heart disease Sister     2 sisters  . Liver disease Sister   . Diabetes Brother   . Heart disease Brother     3 brothers  . Liver disease Brother   . Heart attack Other     sibling  . Melanoma Other     sibling  . Stroke Other     sibling  . Colon cancer Neg Hx    History  Substance Use Topics  . Smoking status: Former Smoker -- 2.00 packs/day for 20 years    Types: Cigarettes    Quit date: 02/22/1975  . Smokeless tobacco: Never Used  . Alcohol Use: No     Comment: 06/21/2012 "none since Feb  2010"    Review of Systems  Constitutional: Negative for fever and chills.  HENT: Negative for congestion and rhinorrhea.   Eyes: Negative for pain.  Respiratory: Negative for cough and shortness of breath.   Cardiovascular: Negative for chest pain and palpitations.  Gastrointestinal: Negative for nausea, vomiting, abdominal pain, diarrhea and constipation.  Endocrine: Negative for polydipsia and polyuria.  Genitourinary: Negative for dysuria and flank pain.  Musculoskeletal: Negative for back pain and neck pain.  Skin: Negative for color change and wound.  Neurological: Positive for weakness. Negative for dizziness, syncope, numbness and headaches.      Allergies  Shellfish-derived products; Doxycycline hyclate; Amoxicillin-pot clavulanate; Aspirin; Buprenorphine hcl-naloxone hcl; Codeine; Ezetimibe; Hydromorphone hcl; Meperidine hcl; Morphine; Morphine sulfate; Oxycodone; and Sulfonamide derivatives  Home Medications   Prior to Admission medications   Medication Sig Start Date End Date Taking? Authorizing Provider  allopurinol (ZYLOPRIM) 300 MG tablet Take 150 mg by mouth every morning.    Yes Historical Provider, MD  Ascorbic Acid (VITAMIN C) 500 MG tablet Take 500 mg by mouth daily.     Yes Historical Provider, MD  Calcium-Magnesium-Zinc (CALCIUM/MAGNESIUM/ZINC FORMULA) 1000-500-50 MG TABS Take 1 tablet by mouth every morning.    Yes Historical Provider, MD  fentaNYL (DURAGESIC - DOSED MCG/HR) 100 MCG/HR Place 100 mcg onto the skin every 3 (three) days.   Yes Historical Provider, MD  Ferrous Gluconate (IRON) 240 (27 FE) MG TABS Take 1 tablet by mouth 2 (two) times daily.    Yes Historical Provider, MD  furosemide (LASIX) 40 MG tablet Take 1 tablet (40 mg total) by mouth 2 (two) times daily. 03/29/13  Yes Gatha Mayer, MD  HYDROcodone-acetaminophen (NORCO/VICODIN) 5-325 MG per tablet Take 1-2 tablets by mouth every 4 (four) hours as needed for moderate pain. 08/06/13  Yes Geradine Girt, DO  hyoscyamine (ANASPAZ) 0.125 MG TBDP disintergrating tablet Place 0.125 mg under the tongue every 4 (four) hours as needed for bladder spasms or cramping.    Yes Historical Provider, MD  insulin aspart (NOVOLOG) 100 UNIT/ML injection Inject 15 Units into the skin 3 (three) times daily with meals. IF PATIENT EATS >50% of meals 08/06/13  Yes Jessica U Vann, DO  isosorbide mononitrate (IMDUR) 60 MG 24 hr tablet Take 60 mg by mouth daily.   Yes Historical Provider, MD  lactulose (CHRONULAC) 10 GM/15ML solution Take 30 mLs (20 g total) by mouth 2 (two) times daily. 03/29/13  Yes Gatha Mayer, MD  loratadine (CLARITIN) 10 MG tablet Take 10 mg by mouth every morning.    Yes Historical Provider, MD  metoCLOPramide (REGLAN) 5 MG tablet Take 1 tablet (5 mg total) by mouth 4 (four) times daily -  before meals and at bedtime. 08/06/13  Yes Geradine Girt, DO  metoprolol tartrate (LOPRESSOR) 25 MG tablet Take 0.5 tablets (12.5 mg total) by mouth 2 (two) times daily. 04/22/13  Yes Hendricks Limes, MD  omeprazole (PRILOSEC OTC) 20 MG tablet Take 20 mg by mouth daily.   Yes Historical Provider, MD  ondansetron (ZOFRAN) 8 MG tablet Take 1 tablet (8 mg total) by mouth every 12 (twelve) hours as needed for nausea or vomiting. 08/06/13  Yes Geradine Girt, DO  pantoprazole (PROTONIX) 40 MG tablet Take 1 tablet (40 mg total) by mouth daily. 08/06/13  Yes Geradine Girt, DO  polyethylene glycol (MIRALAX / GLYCOLAX) packet Take 17 g by mouth daily as needed. Take two or three times a week   Yes Historical Provider, MD  predniSONE (DELTASONE) 5 MG tablet Take 5 mg by mouth daily.    Yes Historical Provider, MD  rifaximin (XIFAXAN) 550 MG TABS tablet Take 550 mg by mouth 2 (two) times daily.   Yes Historical Provider, MD  spironolactone (ALDACTONE) 50 MG tablet Take 1 tablet (50 mg total) by mouth daily. 03/29/13  Yes Gatha Mayer, MD  tamsulosin (FLOMAX) 0.4 MG CAPS capsule Take 1 capsule (0.4 mg total) by mouth daily.  08/06/13  Yes Geradine Girt, DO  Thiamine HCl (VITAMIN B-1) 250 MG tablet Take 125 mg by mouth daily.    Yes Historical Provider, MD  triamcinolone cream (KENALOG) 0.1 % Apply 1 application topically daily.   Yes Historical Provider, MD  vancomycin (VANCOCIN) 50 mg/mL oral solution Take 2.5 mLs (125 mg total) by mouth 4 (four) times daily. 08/06/13  Yes Geradine Girt, DO  Insulin Syringe-Needle U-100 (INSULIN SYRINGE .5CC/30GX1/2") 30G X 1/2" 0.5 ML MISC Use 3 syringes per day 08/07/13   Elayne Snare, MD  nitroGLYCERIN (NITROSTAT) 0.4 MG SL tablet Place 1 tablet (0.4 mg total) under the tongue every 5 (five) minutes as needed for chest pain. 05/27/12   Sheila Oats, MD   BP 112/53  Pulse 62  Temp(Src) 98.6 F (37 C) (Oral)  Resp 18  Ht 5\' 11"  (1.803 m)  Wt 111 lb 8 oz (50.576 kg)  BMI 15.56 kg/m2  SpO2 94% Physical Exam  Nursing note and vitals reviewed. Constitutional: He is oriented to person, place, and time. He appears well-developed and well-nourished.  HENT:  Head: Normocephalic and atraumatic.  Eyes: Conjunctivae and EOM are normal. Pupils are equal, round, and reactive to light.  Neck: Normal range of motion.  Cardiovascular: Normal rate and regular rhythm.   Pulmonary/Chest: Effort normal and breath sounds normal.  Abdominal: Soft. He exhibits no distension. There is no tenderness.  Musculoskeletal: Normal range of motion. He exhibits no edema and no tenderness.  Neurological: He is alert and oriented to person, place, and time.  Mild dysarthria, symmetric LE weakness  Skin: Skin is warm and dry.    ED Course  Procedures (including critical care time) Labs Review Labs Reviewed  PROTIME-INR - Abnormal; Notable for the following:    Prothrombin Time 18.1 (*)    INR 1.54 (*)    All other components within normal limits  CBC - Abnormal; Notable for the following:    RBC  3.74 (*)    Hemoglobin 12.7 (*)    HCT 37.6 (*)    MCV 100.5 (*)    RDW 16.4 (*)    Platelets 50  (*)    All other components within normal limits  DIFFERENTIAL - Abnormal; Notable for the following:    Lymphocytes Relative 11 (*)    Lymphs Abs 0.5 (*)    Monocytes Relative 13 (*)    All other components within normal limits  COMPREHENSIVE METABOLIC PANEL - Abnormal; Notable for the following:    Glucose, Bld 165 (*)    BUN 27 (*)    Creatinine, Ser 1.50 (*)    Total Protein 5.8 (*)    Albumin 2.9 (*)    AST 68 (*)    Alkaline Phosphatase 238 (*)    Total Bilirubin 2.3 (*)    GFR calc non Af Amer 44 (*)    GFR calc Af Amer 51 (*)    All other components within normal limits  URINE RAPID DRUG SCREEN (HOSP PERFORMED) - Abnormal; Notable for the following:    Opiates POSITIVE (*)    All other components within normal limits  GLUCOSE, CAPILLARY - Abnormal; Notable for the following:    Glucose-Capillary 163 (*)    All other components within normal limits  I-STAT CHEM 8, ED - Abnormal; Notable for the following:    Sodium 134 (*)    BUN 32 (*)    Creatinine, Ser 1.60 (*)    Glucose, Bld 162 (*)    Calcium, Ion 1.04 (*)    All other components within normal limits  ETHANOL  APTT  URINALYSIS, ROUTINE W REFLEX MICROSCOPIC  AMMONIA  COMPREHENSIVE METABOLIC PANEL  CBC WITH DIFFERENTIAL  HEMOGLOBIN A1C  LIPID PANEL  I-STAT TROPOININ, ED  I-STAT TROPOININ, ED    Imaging Review Ct Head Wo Contrast  08/11/2013   CLINICAL DATA:  Code stroke. Right-sided facial droop and right arm drift.  EXAM: CT HEAD WITHOUT CONTRAST  TECHNIQUE: Contiguous axial images were obtained from the base of the skull through the vertex without intravenous contrast.  COMPARISON:  Head CT 08/02/2013.  FINDINGS: Mild cerebral atrophy. Patchy areas of mild decreased attenuation are noted throughout the deep and periventricular white matter of the cerebral hemispheres bilaterally, compatible with mild chronic microvascular ischemic disease. No acute intracranial abnormalities. Specifically, no evidence of  acute intracranial hemorrhage, no definite findings of acute/subacute cerebral ischemia, no mass, mass effect, hydrocephalus or abnormal intra or extra-axial fluid collections. Visualized paranasal sinuses and mastoids are well pneumatized. No acute displaced skull fractures are identified.  IMPRESSION: 1. No acute intracranial abnormalities. 2. Mild cerebral atrophy with mild chronic microvascular ischemic changes in the cerebral white matter. These results were called by telephone at the time of interpretation on 08/11/2013 at 5:25 PM to Dr. Armida Sans, who verbally acknowledged these results.   Electronically Signed   By: Vinnie Langton M.D.   On: 08/11/2013 17:25   Mr Brain Wo Contrast  08/11/2013   CLINICAL DATA:  Right hemi paresis.  EXAM: MRI HEAD WITHOUT CONTRAST  TECHNIQUE: Multiplanar, multiecho pulse sequences of the brain and surrounding structures were obtained without intravenous contrast.  COMPARISON:  CT of the head August 11, 2013  FINDINGS: Mild motion degraded examination. No reduced diffusion to suggest acute ischemia. Narrows susceptibility artifact to suggest hemorrhage. No midline shift or mass effect.  The ventricles and sulci are normal for patient's age. Patchy predominately supratentorial white matter T2 hyperintensities.  No abnormal  extra-axial fluid collections. Normal major intracranial vascular flow voids seen at the skull base though, mild dolichoectasia of the intracranial vessels suggests chronic hypertension.  Visualized paranasal sinuses and mastoid air cells are well aerated. Status post bilateral ocular lens implants. The sella is not expanded. No cerebellar tonsillar ectopia. No suspicious bone marrow signal.  IMPRESSION: No acute intracranial process, specifically no acute ischemia.  Involutional changes. Moderate white matter changes suggest chronic small vessel ischemic disease in a background of dolicoectatic intracranial vessels which can be seen with chronic hypertension.    Electronically Signed   By: Elon Alas   On: 08/11/2013 22:17     EKG Interpretation   Date/Time:  Sunday August 11 2013 17:23:29 EDT Ventricular Rate:  70 PR Interval:  203 QRS Duration: 95 QT Interval:  385 QTC Calculation: 415 R Axis:   -6 Text Interpretation:  Sinus rhythm No significant change since last  tracing Confirmed by Ashok Cordia  MD, Lennette Bihari ([id number]) on 08/11/2013 5:45:18 PM      MDM   Final diagnoses:  Alcoholic cirrhosis of liver with ascites  Type 2 diabetes mellitus without complication  HCC (hepatocellular carcinoma)  Right sided weakness    78 yo M w/ h/o DM, Afib, hepatocellular carcinoma here as code stroke for sudden onset right sided weakness around 1430. Taken emergently to scanner after airway cleared. On return patient with LE weakness and dysarthria. Decreased sensation in bilateral LE's. Neurology at bedside and 2/2 comorbidities not candidate for intervention and symptoms could be 2/2 medical problems so patient admitted to medicine for further workup and management.     Merrily Pew, MD 08/11/13 2350

## 2013-08-11 NOTE — ED Notes (Signed)
Pt to MRI

## 2013-08-11 NOTE — Code Documentation (Addendum)
78 year old male presents from home via EMS as Code Stroke.  Per EMS family reports that around 1430 patient was up walking with walker when his right leg gave way.  EMS was called and they reported some right facial droop and right arm drift.  On arrival to ED patient is arousable - little sleepy - answers questions - c/o abdominal and back pain - to CT scan - tol fair = his CBG was 156 BP 125/65 Sr 65.  O2 sat 88% on RA - placed on 2 liter nasal cannula - 93% - patient reports he sleeps with CPAP - compliant.  Abd large - slightly firm right side -o/w soft - with ? Hernia mid abdomen - patient reports he thinks he has one.  Has hx of liver cancer and cirrhosis- patient reports 1.2 liters fluid tapped on Monday or Tuesday this week.  NIHSS 6 - bilateral leg weakness - can move legs but little resistance to gravity.  Slight dysarthria but oral mucosa extremely dry.  Decreased sensation right arm.  Legs very edematous bilaterally - worse on right side.  Dr. Aram Beecham present.  No acute stroke intervention at this time. No family present at this time. Skin feels very hot - ED staff to get rectal temp.  Ammonia level added to lab draws.   Handoff to [employer].     Addendum:  Family present at 70.  They state patient has been more sleepy last 24 hours - had paracentesis Saturday week ago.  State he has good days and bad days.  Last night was up riding his golf cart around.  Today had increased weakness -almost fell early AM.  Son states he was walking with walker and right leg gave away - he was unable to move his leg at that point - did not notice any other focal signs at that point. Stated he also fell similar to this last week.  RN Levada Dy updated.

## 2013-08-11 NOTE — ED Notes (Signed)
Pt given mouth swabs to wet mouth; pt no longer sounds slurred. Wife reports this is how he normally talks.

## 2013-08-12 DIAGNOSIS — E1159 Type 2 diabetes mellitus with other circulatory complications: Secondary | ICD-10-CM

## 2013-08-12 DIAGNOSIS — R609 Edema, unspecified: Secondary | ICD-10-CM

## 2013-08-12 DIAGNOSIS — I798 Other disorders of arteries, arterioles and capillaries in diseases classified elsewhere: Secondary | ICD-10-CM

## 2013-08-12 DIAGNOSIS — I359 Nonrheumatic aortic valve disorder, unspecified: Secondary | ICD-10-CM

## 2013-08-12 LAB — CBC WITH DIFFERENTIAL/PLATELET
BASOS ABS: 0 10*3/uL (ref 0.0–0.1)
Basophils Relative: 0 % (ref 0–1)
EOS ABS: 0 10*3/uL (ref 0.0–0.7)
EOS PCT: 0 % (ref 0–5)
HEMATOCRIT: 34.1 % — AB (ref 39.0–52.0)
HEMOGLOBIN: 11 g/dL — AB (ref 13.0–17.0)
Lymphocytes Relative: 19 % (ref 12–46)
Lymphs Abs: 0.7 10*3/uL (ref 0.7–4.0)
MCH: 32.9 pg (ref 26.0–34.0)
MCHC: 32.3 g/dL (ref 30.0–36.0)
MCV: 102.1 fL — ABNORMAL HIGH (ref 78.0–100.0)
MONO ABS: 0.3 10*3/uL (ref 0.1–1.0)
MONOS PCT: 9 % (ref 3–12)
NEUTROS ABS: 2.6 10*3/uL (ref 1.7–7.7)
Neutrophils Relative %: 72 % (ref 43–77)
Platelets: 38 10*3/uL — ABNORMAL LOW (ref 150–400)
RBC: 3.34 MIL/uL — ABNORMAL LOW (ref 4.22–5.81)
RDW: 16.2 % — AB (ref 11.5–15.5)
WBC: 3.6 10*3/uL — ABNORMAL LOW (ref 4.0–10.5)

## 2013-08-12 LAB — LIPID PANEL
Cholesterol: 255 mg/dL — ABNORMAL HIGH (ref 0–200)
HDL: 45 mg/dL (ref >39)
LDL CALC: 196 mg/dL — AB (ref 0–99)
TRIGLYCERIDES: 71 mg/dL (ref <150)
Total CHOL/HDL Ratio: 5.7 RATIO
VLDL: 14 mg/dL (ref 0–40)

## 2013-08-12 LAB — COMPREHENSIVE METABOLIC PANEL
ALK PHOS: 214 U/L — AB (ref 39–117)
ALT: 26 U/L (ref 0–53)
AST: 54 U/L — AB (ref 0–37)
Albumin: 2.6 g/dL — ABNORMAL LOW (ref 3.5–5.2)
BUN: 30 mg/dL — ABNORMAL HIGH (ref 6–23)
CO2: 32 meq/L (ref 19–32)
Calcium: 8.9 mg/dL (ref 8.4–10.5)
Chloride: 100 mEq/L (ref 96–112)
Creatinine, Ser: 1.48 mg/dL — ABNORMAL HIGH (ref 0.50–1.35)
GFR calc Af Amer: 52 mL/min — ABNORMAL LOW (ref >90)
GFR calc non Af Amer: 45 mL/min — ABNORMAL LOW (ref >90)
Glucose, Bld: 223 mg/dL — ABNORMAL HIGH (ref 70–99)
Potassium: 5.2 mEq/L (ref 3.7–5.3)
SODIUM: 140 meq/L (ref 137–147)
TOTAL PROTEIN: 5.4 g/dL — AB (ref 6.0–8.3)
Total Bilirubin: 3 mg/dL — ABNORMAL HIGH (ref 0.3–1.2)

## 2013-08-12 LAB — HEMOGLOBIN A1C
Hgb A1c MFr Bld: 6.7 % — ABNORMAL HIGH (ref <5.7)
Mean Plasma Glucose: 146 mg/dL — ABNORMAL HIGH (ref <117)

## 2013-08-12 LAB — GLUCOSE, CAPILLARY
Glucose-Capillary: 191 mg/dL — ABNORMAL HIGH (ref 70–99)
Glucose-Capillary: 268 mg/dL — ABNORMAL HIGH (ref 70–99)

## 2013-08-12 NOTE — Evaluation (Signed)
Physical Therapy Evaluation Patient Details Name: [name] MRN: [id number] DOB: 1935-12-24 Today's Date: 08/12/2013   History of Present Illness  78 y.o. male admitted to [facility] on 08/11/13 with right sided weakness and fall suspicious of stroke.  Stroke workup pending.  MRI and CT negative for acute events.  Dx is suspected TIA vs medical complexity.  Pt with significant PMHx of  DM, CHF, SOB, R TKA, multiple lower back surgeries, CABG.   Clinical Impression  Pt is mobilizing well with the use of a rollator.  He has good family support at home and will likely progress well enough to go home with wife's supervision at d/c.  Because he is now having to use a RW (and 2 weeks ago his baseline was cane use) and is generally weaker, he would benefit from HHPT at d/c.   PT to follow acutely for deficits listed below.      Follow Up Recommendations Home health PT;Supervision for mobility/OOB    Equipment Recommendations  None recommended by PT    Recommendations for Other Services   NA    Precautions / Restrictions Precautions Precautions: Fall Precaution Comments: h/o falls      Mobility  Bed Mobility Overal bed mobility: Needs Assistance Bed Mobility: Supine to Sit     Supine to sit: Supervision;HOB elevated (used bed rail)     General bed mobility comments: Extra time needed to get to EOB, but no therapist external support provided.  Supervision for safety  Transfers Overall transfer level: Needs assistance Equipment used: 4-wheeled walker Transfers: Sit to/from Stand Sit to Stand: Min assist         General transfer comment: min assist to stabilize RW during transitions due to pt wanting to pull up on it during transitions. Pt sit to stand from multiple surfaces bed, toilet, recliner with cues for safe hand placement needed  Ambulation/Gait Ambulation/Gait assistance: Min guard Ambulation Distance (Feet): 15 Feet Assistive device: 4-wheeled walker Gait  Pattern/deviations: Step-through pattern;Shuffle;Trunk flexed     General Gait Details: Pt with shuffling pattern, slow speed, flexed posutre, min guard assist for safety due to h/o falls and first time up.  Verbal cues for break use on RW.        Modified Rankin (Stroke Patients Only) Modified Rankin (Stroke Patients Only) Pre-Morbid Rankin Score: Moderate disability Modified Rankin: Moderately severe disability     Balance Overall balance assessment: Needs assistance Sitting-balance support: Feet supported;No upper extremity supported Sitting balance-Leahy Scale: Good     Standing balance support: Bilateral upper extremity supported Standing balance-Leahy Scale: Poor                               Pertinent Vitals/Pain 78 bpm at beginning of session.  See vitals flow sheet.      Home Living Family/patient expects to be discharged to:: Private residence Living Arrangements: Spouse/significant other Available Help at Discharge: Family;Available 24 hours/day (son lives behind them) Type of Home: House Home Access: Stairs to enter Entrance Stairs-Rails: Right Entrance Stairs-Number of Steps: 5 Home Layout: One level;Other (Comment) (with basement, doesn't go down there much) Home Equipment: Walker - 4 wheels;Cane - single point;Bedside commode;Shower seat;Grab bars - tub/shower;Grab bars - toilet;Hand held shower head;Wheelchair - manual;Other (comment) (craftmatic bed with head and foot that goes up/down) Additional Comments: walk in shower curtain    Prior Function Level of Independence: Independent with assistive device(s)  Comments: used RW for 2 weeks, getting weak and unsteady, used cane inside and outside PTA before two weeks ago     Hand Dominance   Dominant Hand: Right    Extremity/Trunk Assessment   Upper Extremity Assessment: Defer to OT evaluation           Lower Extremity Assessment: RLE deficits/detail;LLE  deficits/detail RLE Deficits / Details: bil lower ext edema (pitting) R>L.  H/o saphenous vein graft in right leg with chronic edema R>L due to poor venous return.  Strength 3+/5 within available ROM (~10% decrease in extension ROM due to h/o R TKA on this side).  LLE Deficits / Details: edema still present, but less than R leg, strength 3+/5  Cervical / Trunk Assessment: Other exceptions  Communication   Communication: HOH ("I need them, but don't have them")  Cognition Arousal/Alertness: Awake/alert Behavior During Therapy: WFL for tasks assessed/performed Overall Cognitive Status: Within Functional Limits for tasks assessed                               Assessment/Plan    PT Assessment Patient needs continued PT services  PT Diagnosis Difficulty walking;Abnormality of gait;Generalized weakness;Acute pain   PT Problem List Decreased strength;Decreased activity tolerance;Decreased range of motion;Decreased balance;Decreased mobility;Decreased coordination;Decreased knowledge of use of DME;Impaired sensation;Obesity;Pain  PT Treatment Interventions DME instruction;Gait training;Stair training;Functional mobility training;Therapeutic activities;Therapeutic exercise;Balance training;Neuromuscular re-education;Patient/family education;Modalities   PT Goals (Current goals can be found in the Care Plan section) Acute Rehab PT Goals Patient Stated Goal: to go home, get stronger PT Goal Formulation: With patient/family Time For Goal Achievement: 08/26/13 Potential to Achieve Goals: Good    Frequency Min 4X/week    End of Session Equipment Utilized During Treatment: Gait belt Activity Tolerance: Patient tolerated treatment well Patient left: in chair;Other (comment);with chair alarm set;with family/visitor present Research scientist (life sciences) and SLP coming in room) Nurse Communication: Mobility status;Other (comment) (d/c recs)         Time: 1128-1209 PT Time Calculation (min): 41  min   Charges:   PT Evaluation $Initial PT Evaluation Tier I: 1 Procedure PT Treatments $Therapeutic Activity: 23-37 mins        Rebecca B. [place], [place], DPT [phone]   08/12/2013, 12:41 PM

## 2013-08-12 NOTE — Evaluation (Signed)
Clinical/Bedside Swallow Evaluation Patient Details  Name: [name] MRN: [id number] Date of Birth: 31-Mar-1935  Today's Date: 08/12/2013 Time: 1209-1219 SLP Time Calculation (min): 10 min  Past Medical History:  Past Medical History  Diagnosis Date  . Diverticulosis   . Gastric antral vascular ectasia   . Rhinophyma   . CAD (coronary artery disease)     myoview 2007 no ischemia/ no ASA because of GI disease  . Atrial fibrillation     amiodarone in past, but problems and left off meds.  . Aortic stenosis     echo, September, 2010, mild aortic insufficiency  . Visual loss, one eye     right eye-ophthalmic arterial branch occlusion  . Internal hemorrhoids   . GERD (gastroesophageal reflux disease)   . Hypertension   . Allergic rhinitis   . Anemia     multifactorial  . Cirrhosis, alcoholic   . Thrombocytopenia   . Bleeding esophageal varices   . Encephalopathy, unspecified   . Gout   . Short-segment Barrett's esophagus     suspected  . Venous insufficiency   . Ejection fraction     EF 55%, echo, September, 2010  . Carotid bruit     Dopplers okay in the past  . Cellulitis     recurrent to RLE/notes 06/21/2012  . CHF (congestive heart failure)   . Sinus bradycardia     January, 2014  . Diastolic CHF     January, 2014  . Complication of anesthesia     "quit breathing once a long time ago; dropped my BP too; it was after I was given Dilaudid" (06/21/2012)  . Heart murmur   . Anginal pain   . Shortness of breath     "stay that way most of the time now" (06/21/2012)  . OSA on CPAP   . Diabetes mellitus, type 2   . History of blood transfusion     "not related to OR" (06/21/2012)  . H/O hiatal hernia   . Osteoarthritis     "ALL OVER" (06/21/2012)  . Chronic lower back pain   . Aspiration into airway-silent on modified barium swallow 03/20/2013  . Melanoma     resected by [facility] 2008  . Skin cancer     Basal cell and squamous cell  . Esophageal dysmotility 10/09/2012    Past Surgical History:  Past Surgical History  Procedure Laterality Date  . Total knee arthroplasty Right   . Esophagogastroduodenoscopy  04/10/2008    esophageal varices, cardia varix  . Tips procedure  2/10  . Back surgery      X5 "for ruptured disc; last time did a fusion" (06/21/2012)  . Excisional hemorrhoidectomy    . Release scar contracture / graft repairs of hand      bilaterally   . Elbow surgery Right   . Hiatal hernia repair    . Colonoscopy w/ polypectomy  02/14/2007    adenomatous polyps, diverticulosis, internal hemorrhoids/rectal varices  . Cataract extraction, bilateral  January 2013  . Tonsillectomy    . Appendectomy    . Joint replacement    . Coronary artery bypass graft  1993    6 Bypass  . Cataract extraction w/ intraocular lens  implant, bilateral     HPI:  [name] is a 78 y.o. male with history of hepatocellular carcinoma status post radiation and seeding(March and April this year), cirrhosis of liver, OSA, diabetes mellitus, Barretts esophagus, GERD,  who was recently admitted to the hospital  for hepatitic encephalopathy and hypoglycemia. Admitted with right sided weakness and dysarthia, diplopia, difficulty walking.   Patient's right facial droop resolved after a few minutes. MRI brain negative.    Assessment / Plan / Recommendation Clinical Impression  Patient presents with a suspected primary esophageal dysphagia characterized what appears to be a functional oropharyngeal swallow but with frequent c/o globus and belching. Patient with a h/o mild pharyngeal dysphagia noted during MBS 02/12/13 with recommendations for a regular diet, thin liquids with precautions. Note documentation of silent aspiration on barium swallow 02/2013 however no record noted. Per wife, patient with EGD and esophageal dilation 01/2013 with little change in overall function.     Aspiration Risk  Mild    Diet Recommendation Regular;Thin liquid   Liquid Administration via:  Cup;Straw Medication Administration: Whole meds with liquid Supervision: Patient [name] to self feed;Intermittent supervision to cue for compensatory strategies Compensations: Slow rate;Small sips/bites;Follow solids with liquid Postural Changes and/or Swallow Maneuvers: Seated upright 90 degrees;Upright 30-60 min after meal    Other  Recommendations Oral Care Recommendations: Oral care BID   Follow Up Recommendations  None    Frequency and Duration        Pertinent Vitals/Pain n/a        Swallow Study Prior Functional Status  Type of Home: House Available Help at Discharge: Family;Available 24 hours/day (son lives behind them)    General HPI: [name] is a 78 y.o. male with history of hepatocellular carcinoma status post radiation and seeding(March and April this year), cirrhosis of liver, OSA, diabetes mellitus, Barretts esophagus, GERD,  who was recently admitted to the hospital for hepatitic encephalopathy and hypoglycemia. Admitted with right sided weakness and dysarthia, diplopia, difficulty walking.   Patient's right facial droop resolved after a few minutes. MRI brain negative.  Type of Study: Bedside swallow evaluation Previous Swallow Assessment: none Diet Prior to this Study: Regular;Thin liquids Temperature Spikes Noted: No Respiratory Status: Room air History of Recent Intubation: No Behavior/Cognition: Alert;Cooperative;Pleasant mood Oral Cavity - Dentition: Adequate natural dentition Self-Feeding Abilities: [name] to feed self Patient Positioning: Upright in chair Baseline Vocal Quality: Clear Volitional Cough: Strong Volitional Swallow: [name] to elicit    Oral/Motor/Sensory Function Overall Oral Motor/Sensory Function: Appears within functional limits for tasks assessed   Ice Chips Ice chips: Not tested   Thin Liquid Thin Liquid: Within functional limits Other Comments: belching    Nectar Thick Nectar Thick Liquid: Not tested   Honey Thick Honey Thick  Liquid: Not tested   Puree Puree: Not tested   Solid   GO   ,[name] McCoy MA, CCC-SLP [phone]  Solid: Within functional limits Other Comments: belching       [name] 08/12/2013,1:39 PM

## 2013-08-12 NOTE — Progress Notes (Signed)
Nutrition Brief Note  Patient identified on the Malnutrition Screening Tool (MST) Report  Wt Readings from Last 15 Encounters:  08/11/13 245 lb 5 oz (111.5 kg)  08/02/13 248 lb 12.8 oz (112.855 kg)  07/31/13 254 lb (115.214 kg)  07/26/13 251 lb 3.2 oz (113.944 kg)  07/22/13 248 lb (112.492 kg)  07/10/13 250 lb 2 oz (113.456 kg)  07/05/13 254 lb 6.4 oz (115.395 kg)  06/06/13 244 lb (110.678 kg)  06/05/13 243 lb 9.6 oz (110.496 kg)  06/03/13 243 lb (110.224 kg)  05/29/13 245 lb (111.131 kg)  05/15/13 257 lb 12.8 oz (116.937 kg)  05/14/13 257 lb 6.4 oz (116.756 kg)  05/10/13 253 lb 12.8 oz (115.123 kg)  05/08/13 248 lb 6.4 oz (112.674 kg)    Body mass index is 34.2 kg/(m^2). Patient meets criteria for Obesity based on current BMI.   Current diet order is Heart Healthy/Carb Modified, patient is consuming approximately 50-100% of meals at this time. Pt reports that his appetite is good. He reports a history of poor PO intake and 30 lbs weight loss prior to radiation but, states that since April he has been eating well and maintaining his weight.  Pt denies any nutrition questions or concerns at this time. Labs and medications reviewed.   No nutrition interventions warranted at this time. If nutrition issues arise, please consult RD.   Pryor Ochoa RD, LDN Inpatient Clinical Dietitian Pager: [phone] After Hours Pager: [phone]

## 2013-08-12 NOTE — Progress Notes (Signed)
Discharge instructions given. Pt verbalized understanding and all questions were answered.  

## 2013-08-12 NOTE — Progress Notes (Signed)
NEURO HOSPITALIST PROGRESS NOTE   SUBJECTIVE:                                                                                                                        [name] is looking better this morning and said that the right arm and leg are back to baseline. Hands and legs tremors also improved. MRI brain showed no acute intracranial abnormality.  OBJECTIVE:                                                                                                                           Vital signs in last 24 hours: Temp:  [98.2 F (36.8 C)-98.6 F (37 C)] 98.6 F (37 C) (06/22 0635) Pulse Rate:  [57-68] 63 (06/22 0635) Resp:  [11-18] 18 (06/22 0635) BP: (112-139)/(49-73) 128/49 mmHg (06/22 0635) SpO2:  [91 %-99 %] 98 % (06/22 0635) Weight:  [50.576 kg (111 lb 8 oz)] 50.576 kg (111 lb 8 oz) (06/21 2030)  Intake/Output from previous day: 06/21 0701 - 06/22 0700 In: -  Out: 200 [Urine:200] Intake/Output this shift:   Nutritional status:    Past Medical History  Diagnosis Date  . Diverticulosis   . Gastric antral vascular ectasia   . Rhinophyma   . CAD (coronary artery disease)     myoview 2007 no ischemia/ no ASA because of GI disease  . Atrial fibrillation     amiodarone in past, but problems and left off meds.  . Aortic stenosis     echo, September, 2010, mild aortic insufficiency  . Visual loss, one eye     right eye-ophthalmic arterial branch occlusion  . Internal hemorrhoids   . GERD (gastroesophageal reflux disease)   . Hypertension   . Allergic rhinitis   . Anemia     multifactorial  . Cirrhosis, alcoholic   . Thrombocytopenia   . Bleeding esophageal varices   . Encephalopathy, unspecified   . Gout   . Short-segment Barrett's esophagus     suspected  . Venous insufficiency   . Ejection fraction     EF 55%, echo, September, 2010  . Carotid bruit     Dopplers okay in the past  . Cellulitis     recurrent to  RLE/notes  06/21/2012  . CHF (congestive heart failure)   . Sinus bradycardia     January, 2014  . Diastolic CHF     January, 2014  . Complication of anesthesia     "quit breathing once a long time ago; dropped my BP too; it was after I was given Dilaudid" (06/21/2012)  . Heart murmur   . Anginal pain   . Shortness of breath     "stay that way most of the time now" (06/21/2012)  . OSA on CPAP   . Diabetes mellitus, type 2   . History of blood transfusion     "not related to OR" (06/21/2012)  . H/O hiatal hernia   . Osteoarthritis     "ALL OVER" (06/21/2012)  . Chronic lower back pain   . Aspiration into airway-silent on modified barium swallow 03/20/2013  . Melanoma     resected by [facility] 2008  . Skin cancer     Basal cell and squamous cell  . Esophageal dysmotility 10/09/2012    Neurologic Exam:  Mental Status:  Alert, oriented, thought content appropriate. Mild dysarthria. Able to follow 3 step commands without difficulty.  Cranial Nerves:  II: Discs flat bilaterally; Visual fields grossly normal, pupils equal, round, reactive to light and accommodation  III,IV, VI: ptosis not present, extra-ocular motions intact bilaterally  V,VII: smile symmetric, facial light touch sensation normal bilaterally  VIII: hearing normal bilaterally  IX,X: gag reflex present  XI: bilateral shoulder shrug  XII: midline tongue extension without atrophy or fasciculations  Motor:  Moves all extremities symmetrically Tone and bulk:normal tone throughout; no atrophy noted  Sensory: Pinprick and light touch intact throughout, bilaterally  Deep Tendon Reflexes:  1 all over  Plantars:  Right: downgoing Left: downgoing  Cerebellar:  Bilateral resting and kinetic tremor  Gait: no tested   Lab Results: Lab Results  Component Value Date/Time   CHOL 255* 08/12/2013  4:53 AM   Lipid Panel  Recent Labs  08/12/13 0453  CHOL 255*  TRIG 71  HDL 45  CHOLHDL 5.7  VLDL 14  LDLCALC 196*    Studies/Results: Dg  Chest 2 View  08/12/2013   CLINICAL DATA:  Stroke.  Fall  EXAM: CHEST  2 VIEW  COMPARISON:  08/04/2013  FINDINGS: Previous median sternotomy and CABG procedure. Normal heart size. There is a small left pleural effusion. Pulmonary vascular congestion is noted. No airspace consolidation.  IMPRESSION: 1. Small left pleural effusion. 2. Pulmonary vascular congestion   Electronically Signed   By: Kerby Moors M.D.   On: 08/12/2013 08:02   Ct Head Wo Contrast  08/11/2013   CLINICAL DATA:  Code stroke. Right-sided facial droop and right arm drift.  EXAM: CT HEAD WITHOUT CONTRAST  TECHNIQUE: Contiguous axial images were obtained from the base of the skull through the vertex without intravenous contrast.  COMPARISON:  Head CT 08/02/2013.  FINDINGS: Mild cerebral atrophy. Patchy areas of mild decreased attenuation are noted throughout the deep and periventricular white matter of the cerebral hemispheres bilaterally, compatible with mild chronic microvascular ischemic disease. No acute intracranial abnormalities. Specifically, no evidence of acute intracranial hemorrhage, no definite findings of acute/subacute cerebral ischemia, no mass, mass effect, hydrocephalus or abnormal intra or extra-axial fluid collections. Visualized paranasal sinuses and mastoids are well pneumatized. No acute displaced skull fractures are identified.  IMPRESSION: 1. No acute intracranial abnormalities. 2. Mild cerebral atrophy with mild chronic microvascular ischemic changes in the cerebral white matter. These results were called by  telephone at the time of interpretation on 08/11/2013 at 5:25 PM to Dr. Armida Sans, who verbally acknowledged these results.   Electronically Signed   By: Vinnie Langton M.D.   On: 08/11/2013 17:25   Mr Brain Wo Contrast  08/11/2013   CLINICAL DATA:  Right hemi paresis.  EXAM: MRI HEAD WITHOUT CONTRAST  TECHNIQUE: Multiplanar, multiecho pulse sequences of the brain and surrounding structures were obtained without  intravenous contrast.  COMPARISON:  CT of the head August 11, 2013  FINDINGS: Mild motion degraded examination. No reduced diffusion to suggest acute ischemia. [place] susceptibility artifact to suggest hemorrhage. No midline shift or mass effect.  The ventricles and sulci are normal for patient's age. Patchy predominately supratentorial white matter T2 hyperintensities.  No abnormal extra-axial fluid collections. Normal major intracranial vascular flow voids seen at the skull base though, mild dolichoectasia of the intracranial vessels suggests chronic hypertension.  Visualized paranasal sinuses and mastoid air cells are well aerated. Status post bilateral ocular lens implants. The sella is not expanded. No cerebellar tonsillar ectopia. No suspicious bone marrow signal.  IMPRESSION: No acute intracranial process, specifically no acute ischemia.  Involutional changes. Moderate white matter changes suggest chronic small vessel ischemic disease in a background of dolicoectatic intracranial vessels which can be seen with chronic hypertension.   Electronically Signed   By: Elon Alas   On: 08/11/2013 22:17    MEDICATIONS                                                                                                                        Scheduled: . allopurinol  150 mg Oral q morning - 10a  . calcium carbonate  1 tablet Oral Q breakfast  . [START ON 08/14/2013] fentaNYL  100 mcg Transdermal Q72H  . ferrous gluconate  324 mg Oral BID  . furosemide  40 mg Oral BID  . insulin aspart  0-9 Units Subcutaneous TID WC  . insulin aspart  15 Units Subcutaneous TID WC  . isosorbide mononitrate  60 mg Oral Daily  . lactulose  20 g Oral BID  . loratadine  10 mg Oral q morning - 10a  . magnesium oxide  400 mg Oral Daily  . metoCLOPramide  5 mg Oral TID AC & HS  . metoprolol tartrate  12.5 mg Oral BID  . pantoprazole  40 mg Oral Daily  . pantoprazole  40 mg Oral Daily  . predniSONE  5 mg Oral Daily  .  rifaximin  550 mg Oral BID  . spironolactone  50 mg Oral Daily  . tamsulosin  0.4 mg Oral Daily  . thiamine  100 mg Oral Daily  . triamcinolone cream  1 application Topical Daily  . vancomycin  125 mg Oral QID  . vitamin C  500 mg Oral Daily  . zinc sulfate  220 mg Oral Daily    ASSESSMENT/PLAN:  78 y.o. male brought in with acute onset right sided weakness that had resolved. MRI brain negative. Although can not entirely exclude TIA, I feel more inclined to believe patient's presentation most likely related to progression of underlying medical issues. Neurology will sign off.  Dorian Pod, MD [facility] [phone]  08/12/2013, 8:30 AM

## 2013-08-12 NOTE — Discharge Summary (Signed)
Physician Discharge Summary  [name] VQX:[id number] DOB: 1936/01/11 DOA: 08/11/2013  PCP: Unice Cobble, MD  Admit date: 08/11/2013 Discharge date: 08/13/2013  Time spent: 35 minutes  Recommendations for Outpatient Follow-up:  1. Consider hospice 2. Consider outpatient paracentesis  Discharge Diagnoses:  Principal Problem:   Right sided weakness Active Problems:   THROMBOCYTOPENIA, CHRONIC   CIRRHOSIS, ALCOHOLIC   SLEEP APNEA    ASCITES   CORONARY ARTERY BYPASS GRAFT, HX OF   DM2 (diabetes mellitus, type 2)   Discharge Condition: stable  Diet recommendation: low Na  Filed Weights   08/11/13 2030  Weight: 50.576 kg (111 lb 8 oz)    History of present illness:  [name] is a 78 y.o. male with history of hepatocellular carcinoma status post radiation and seeding(March and April this year), cirrhosis of liver, OSA, diabetes mellitus, atrial fibrillation, CAD, recurrent lower extremity cellulitis who was recently admitted to the hospital for hepatitic encephalopathy and hypoglycemia and also was found to have C. difficile colitis started experiencing difficulty walking since morning after he woke up. At around 2:30 PM patient stumbled and fell but her son was able to catch him before he hit the ground. Since then patient has been experiencing right-sided weakness. Initially it was both upper and lower extremity but now it is only right lower extremity. Patient at that moment also had some dysarthria and right facial droop. Patient's right facial droop resolved after a few minutes. Patient also had mild diplopia at that time. Presently on exam patient has 3 x 5 strength in right lower extremity. Rest of the extremity patient is able to move all. Denies any headache difficulty swallowing and patient's speech is improved. Denies any chest pain shortness of breath nausea vomiting abdominal pain diarrhea. Patient was recent diagnosed with C. difficile and since starting vancomycin diet  has improved   Hospital Course:  78 y.o. male brought in with acute onset right sided weakness that had resolved.  MRI brain negative.  Although can not entirely exclude TIA, I feel more inclined to believe patient's presentation most likely related to progression of underlying medical issues.   Procedures:  none  Consultations:  neuro  Discharge Exam: Filed Vitals:   08/12/13 1133  BP:   Pulse: 78  Temp:   Resp:     General: A+Ox3, NAD Cardiovascular: rrr Respiratory: clear  Discharge Instructions You were cared for by a hospitalist during your hospital stay. If you have any questions about your discharge medications or the care you received while you were in the hospital after you are discharged, you can call the unit and asked to speak with the hospitalist on call if the hospitalist that took care of you is not available. Once you are discharged, your primary care physician will handle any further medical issues. Please note that NO REFILLS for any discharge medications will be authorized once you are discharged, as it is imperative that you return to your primary care physician (or establish a relationship with a primary care physician if you do not have one) for your aftercare needs so that they can reassess your need for medications and monitor your lab values.      Discharge Instructions   Diet - low sodium heart healthy    Complete by:  As directed      Diet Carb Modified    Complete by:  As directed      Discharge instructions    Complete by:  As directed  Home health Cbc, bmp 1 week If swelling worsens, consider outpatient scheduled paracentesis     Increase activity slowly    Complete by:  As directed             Medication List    STOP taking these medications       INSULIN SYRINGE .5CC/30GX1/2" 30G X 1/2" 0.5 ML Misc      TAKE these medications       allopurinol 300 MG tablet  Commonly known as:  ZYLOPRIM  Take 150 mg by mouth every morning.      CALCIUM/MAGNESIUM/ZINC FORMULA 1000-500-50 MG Tabs  Generic drug:  Calcium-Magnesium-Zinc  Take 1 tablet by mouth every morning.     fentaNYL 100 MCG/HR  Commonly known as:  DURAGESIC - dosed mcg/hr  Place 100 mcg onto the skin every 3 (three) days.     furosemide 40 MG tablet  Commonly known as:  LASIX  Take 1 tablet (40 mg total) by mouth 2 (two) times daily.     HYDROcodone-acetaminophen 5-325 MG per tablet  Commonly known as:  NORCO/VICODIN  Take 1-2 tablets by mouth every 4 (four) hours as needed for moderate pain.     hyoscyamine 0.125 MG Tbdp disintergrating tablet  Commonly known as:  ANASPAZ  Place 0.125 mg under the tongue every 4 (four) hours as needed for bladder spasms or cramping.     insulin aspart 100 UNIT/ML injection  Commonly known as:  novoLOG  Inject 15 Units into the skin 3 (three) times daily with meals. IF PATIENT EATS >50% of meals     Iron 240 (27 FE) MG Tabs  Take 1 tablet by mouth 2 (two) times daily.     isosorbide mononitrate 60 MG 24 hr tablet  Commonly known as:  IMDUR  Take 60 mg by mouth daily.     lactulose 10 GM/15ML solution  Commonly known as:  CHRONULAC  Take 30 mLs (20 g total) by mouth 2 (two) times daily.     loratadine 10 MG tablet  Commonly known as:  CLARITIN  Take 10 mg by mouth every morning.     metoCLOPramide 5 MG tablet  Commonly known as:  REGLAN  Take 1 tablet (5 mg total) by mouth 4 (four) times daily -  before meals and at bedtime.     metoprolol tartrate 25 MG tablet  Commonly known as:  LOPRESSOR  Take 0.5 tablets (12.5 mg total) by mouth 2 (two) times daily.     nitroGLYCERIN 0.4 MG SL tablet  Commonly known as:  NITROSTAT  Place 1 tablet (0.4 mg total) under the tongue every 5 (five) minutes as needed for chest pain.     omeprazole 20 MG tablet  Commonly known as:  PRILOSEC OTC  Take 20 mg by mouth daily.     ondansetron 8 MG tablet  Commonly known as:  ZOFRAN  Take 1 tablet (8 mg total) by mouth  every 12 (twelve) hours as needed for nausea or vomiting.     pantoprazole 40 MG tablet  Commonly known as:  PROTONIX  Take 1 tablet (40 mg total) by mouth daily.     polyethylene glycol packet  Commonly known as:  MIRALAX / GLYCOLAX  Take 17 g by mouth daily as needed. Take two or three times a week     predniSONE 5 MG tablet  Commonly known as:  DELTASONE  Take 5 mg by mouth daily.     rifaximin 550 MG Tabs  tablet  Commonly known as:  XIFAXAN  Take 550 mg by mouth 2 (two) times daily.     spironolactone 50 MG tablet  Commonly known as:  ALDACTONE  Take 1 tablet (50 mg total) by mouth daily.     tamsulosin 0.4 MG Caps capsule  Commonly known as:  FLOMAX  Take 1 capsule (0.4 mg total) by mouth daily.     triamcinolone cream 0.1 %  Commonly known as:  KENALOG  Apply 1 application topically daily.     vancomycin 50 mg/mL oral solution  Commonly known as:  VANCOCIN  Take 2.5 mLs (125 mg total) by mouth 4 (four) times daily.     vitamin B-1 250 MG tablet  Take 125 mg by mouth daily.     vitamin C 500 MG tablet  Commonly known as:  ASCORBIC ACID  Take 500 mg by mouth daily.       Allergies  Allergen Reactions  . Shellfish-Derived Products     Rash & angioedema  . Doxycycline Hyclate     Diarrhea   . Amoxicillin-Pot Clavulanate     Unsure what reaction was but pt tolerated penicillin 05/25/2012  . Aspirin     REACTION: unable to tolerate due to cirrhosis and varices  . Buprenorphine Hcl-Naloxone Hcl Other (See Comments)    unresponsive  . Codeine     Rash    . Ezetimibe     REACTION: LEG/JOINT PAIN  . Hydromorphone Hcl     Per wife "pt developed respiratory distress"  . Meperidine Hcl     Per wife "his blood pressure dropped."  . Morphine     Per wife "pt sees spider webs."  . Morphine Sulfate     REACTION: unspecified  . Oxycodone     hallucinations  . Sulfonamide Derivatives Nausea And Vomiting   Follow-up Information   Follow up with Unice Cobble, MD In 1 week. (may need outpatient Paracentesis)    Specialty:  Internal Medicine   Contact information:   [address] [place] [address] [phone]        The results of significant diagnostics from this hospitalization (including imaging, microbiology, ancillary and laboratory) are listed below for reference.    Significant Diagnostic Studies: Ct Abdomen Pelvis Wo Contrast  08/05/2013   CLINICAL DATA:  Abdominal pain and distention.  EXAM: CT ABDOMEN AND PELVIS WITHOUT CONTRAST  TECHNIQUE: Multidetector CT imaging of the abdomen and pelvis was performed following the standard protocol without IV contrast.  COMPARISON:  CT of the abdomen and pelvis performed 08/02/2013, and MRI of the abdomen performed 07/18/2013  FINDINGS: A small left pleural effusion is noted, with associated atelectasis. Diffuse coronary artery calcifications are seen.  Diffuse cirrhotic changes again noted at the liver. Vague masses within the liver reflect the patient's known history of hepatocellular carcinoma. The patient's TIPS is incompletely assessed without contrast. The spleen is significantly enlarged, measuring 17.8 cm in length. Stones are seen layering dependently within the gallbladder; the gallbladder is otherwise unremarkable in appearance, though not well assessed due to ascites.  Small to moderate volume ascites is noted within the abdomen and pelvis. Known thrombus within the portal venous system is not well assessed without contrast. Vague soft tissue inflammation within the mesentery of the upper abdomen is nonspecific but appears grossly stable.  The kidneys are unremarkable in appearance. There is no evidence of hydronephrosis. No renal or ureteral stones are seen. No perinephric stranding is appreciated.  No free  fluid is identified. The small bowel is unremarkable in appearance. The stomach is within normal limits. No acute vascular abnormalities are seen. Relatively diffuse calcification  is seen along the abdominal aorta and its branches.  The appendix is filled with air and is unremarkable in appearance. There is no evidence for appendicitis. Fatty infiltration is noted at the ileocecal valve. Contrast progresses to the level of the transverse colon. Scattered diverticulosis is seen along the descending and proximal sigmoid colon, without evidence of diverticulitis.  The bladder is decompressed, with a Foley catheter in place. The prostate remains normal in size. No inguinal lymphadenopathy is seen.  No acute osseous abnormalities are identified.  IMPRESSION: 1. Small to moderate volume ascites again noted within the abdomen and pelvis, perhaps mildly increased from the recent prior CT. 2. Hepatic cirrhosis again noted; known multifocal hepatocellular carcinoma is better characterized on prior MRI. The TIPS is incompletely assessed without contrast; known thrombus within the portal venous system is difficult to assess without contrast. 3. Splenomegaly again noted. 4. Cholelithiasis noted. 5. Scattered diverticulosis along the descending and proximal sigmoid colon, without evidence of diverticulitis. 6. Relatively diffuse calcification along the abdominal aorta and its branches. 7. Diffuse coronary artery calcifications seen. 8. Small left pleural effusion, with associated atelectasis.   Electronically Signed   By: Garald Balding M.D.   On: 08/05/2013 01:45   Ct Abdomen Pelvis Wo Contrast  08/02/2013   CLINICAL DATA:  Fall with abdominal pain. History of cirrhosis with multifocal HCC post TIPS and Y-90 treatment.  EXAM: CT ABDOMEN AND PELVIS WITHOUT CONTRAST  TECHNIQUE: Multidetector CT imaging of the abdomen and pelvis was performed following the standard protocol without IV contrast.  COMPARISON:  MRI 07/18/2013 and CT 06/28/2013 as well as 03/21/2013  FINDINGS: Lung bases demonstrate a small amount of left pleural fluid with mild dependent bibasilar atelectasis. Sternotomy wires are present.  There is calcification in the region of the mitral valve annulus. Is increased density over the region of the aortic valve which may represent of prosthetic valve.  Abdominal images demonstrate a small nodular liver compatible with patient's known cirrhosis. There are 2 ill-defined hypodense masses over the film of the right lobe measuring 3 cm and 2.7 cm respectively without significant change. Remaining known liver lesions are better defined on the recent MRI. Patient's portosystemic shunt is unchanged. There is moderate cholelithiasis. The pancreas and adrenal glands are within normal. Kidneys are normal in size without hydronephrosis or nephrolithiasis. There is mild splenomegaly unchanged. There is evidence of mild ascites which is slightly worse. There is no free peritoneal air. Appendix is normal. There is moderate diverticulosis of the colon. There is moderate calcified plaque involving the abdominal aorta and iliac arteries.  Pelvic images demonstrate a mild-to-moderate amount of free fluid with Hounsfield unit measurements of 18 likely related to patient's ascites secondary to liver disease and not secondary to trauma. Remaining pelvic structures are unremarkable.  There are moderate degenerative changes of the spine with mild degenerative change of the hips. There is no acute fracture.  IMPRESSION: No acute injury in the abdomen/pelvis.  Evidence of known cirrhosis with sequelae of portal hypertension with splenomegaly and ascites. There has been slight interval worsening of patient's ascites. Known multifocal hepatic cellular carcinoma without significant change and stable portosystemic shunt.  Small amount of left pleural fluid with bibasilar atelectatic change.  Diverticulosis of the colon.  Moderate cholelithiasis.   Electronically Signed   By: Marin Olp M.D.   On: 08/02/2013  08:20   Dg Chest 2 View  08/12/2013   CLINICAL DATA:  Stroke.  Fall  EXAM: CHEST  2 VIEW  COMPARISON:  08/04/2013   FINDINGS: Previous median sternotomy and CABG procedure. Normal heart size. There is a small left pleural effusion. Pulmonary vascular congestion is noted. No airspace consolidation.  IMPRESSION: 1. Small left pleural effusion. 2. Pulmonary vascular congestion   Electronically Signed   By: Kerby Moors M.D.   On: 08/12/2013 08:02   Dg Shoulder Right  08/02/2013   CLINICAL DATA:  Fall, pain.  EXAM: RIGHT SHOULDER - 2+ VIEW  COMPARISON:  None.  FINDINGS: No acute fracture or dislocation. The humeral head is normally aligned within the glenoid. AC joint is approximated. Subarticular spurring noted at the [facility] joint. No periarticular calcification. No soft tissue abnormality.  Osseous mineralization is normal.  IMPRESSION: No acute fracture or dislocation.   Electronically Signed   By: Jeannine Boga M.D.   On: 08/02/2013 06:28   Ct Head Wo Contrast  08/11/2013   CLINICAL DATA:  Code stroke. Right-sided facial droop and right arm drift.  EXAM: CT HEAD WITHOUT CONTRAST  TECHNIQUE: Contiguous axial images were obtained from the base of the skull through the vertex without intravenous contrast.  COMPARISON:  Head CT 08/02/2013.  FINDINGS: Mild cerebral atrophy. Patchy areas of mild decreased attenuation are noted throughout the deep and periventricular white matter of the cerebral hemispheres bilaterally, compatible with mild chronic microvascular ischemic disease. No acute intracranial abnormalities. Specifically, no evidence of acute intracranial hemorrhage, no definite findings of acute/subacute cerebral ischemia, no mass, mass effect, hydrocephalus or abnormal intra or extra-axial fluid collections. Visualized paranasal sinuses and mastoids are well pneumatized. No acute displaced skull fractures are identified.  IMPRESSION: 1. No acute intracranial abnormalities. 2. Mild cerebral atrophy with mild chronic microvascular ischemic changes in the cerebral white matter. These results were called by telephone at  the time of interpretation on 08/11/2013 at 5:25 PM to Dr. Armida Sans, who verbally acknowledged these results.   Electronically Signed   By: Vinnie Langton M.D.   On: 08/11/2013 17:25   Ct Head Wo Contrast  08/02/2013   CLINICAL DATA:  Lethargy  EXAM: CT HEAD WITHOUT CONTRAST  TECHNIQUE: Contiguous axial images were obtained from the base of the skull through the vertex without intravenous contrast.  COMPARISON:  04/17/2010  FINDINGS: Mild atrophic changes are seen. Mild chronic white matter ischemic change is again identified. No findings to suggest acute hemorrhage, acute infarction or space-occupying mass lesion are noted. The bony calvarium is intact.  IMPRESSION: Chronic changes without acute abnormality.   Electronically Signed   By: Inez Catalina M.D.   On: 08/02/2013 08:03   Mr Brain Wo Contrast  08/11/2013   CLINICAL DATA:  Right hemi paresis.  EXAM: MRI HEAD WITHOUT CONTRAST  TECHNIQUE: Multiplanar, multiecho pulse sequences of the brain and surrounding structures were obtained without intravenous contrast.  COMPARISON:  CT of the head August 11, 2013  FINDINGS: Mild motion degraded examination. No reduced diffusion to suggest acute ischemia. [place] susceptibility artifact to suggest hemorrhage. No midline shift or mass effect.  The ventricles and sulci are normal for patient's age. Patchy predominately supratentorial white matter T2 hyperintensities.  No abnormal extra-axial fluid collections. Normal major intracranial vascular flow voids seen at the skull base though, mild dolichoectasia of the intracranial vessels suggests chronic hypertension.  Visualized paranasal sinuses and mastoid air cells are well aerated. Status post bilateral ocular lens implants. The sella is  not expanded. No cerebellar tonsillar ectopia. No suspicious bone marrow signal.  IMPRESSION: No acute intracranial process, specifically no acute ischemia.  Involutional changes. Moderate white matter changes suggest chronic small  vessel ischemic disease in a background of dolicoectatic intracranial vessels which can be seen with chronic hypertension.   Electronically Signed   By: Elon Alas   On: 08/11/2013 22:17   Mr Liver W Wo Contrast  07/18/2013   CLINICAL DATA:  Cirrhosis with multifocal HCC status post TIPS and Y-90 microsphere treatment  EXAM: MRI ABDOMEN WITHOUT AND WITH CONTRAST  TECHNIQUE: Multiplanar, multisequence MR imaging was performed both before and after administration of intravenous contrast.  CONTRAST:  9 mL Eovist IV  COMPARISON:  MRI abdomen dated 04/03/2013  FINDINGS: Cirrhosis. TIPS shunt in the right hepatic lobe. Atrophy of the left hepatic lobe versus prior resection. Multiple hypoenhancing liver lesions have improved, as follows:  --2.1 x 2.6 cm hypoenhancing lesion in the posterior right hepatic dome (series 504/image 34), previously 3.7 x 3.8 cm  --1.8 x 1.7 cm hypoenhancing lesion in the anterior segment right hepatic lobe (series 504/image 36), previously 1.9 x 1.9 cm  --10 mm lesion laterally in the posterior segment right hepatic lobe (series 504/image 51), previously 1.3 x 1.5 cm  --1.2 x 1.4 cm lesion in the central left hepatic lobe (series 13/image 38), previously 1.3 x 1.2 cm  Additional prior 1.9 x 1.9 cm lesion in the anterior segment right hepatic lobe (series 13/image 46) has resolved on the current study.  Splenomegaly, measuring 18.7 cm in maximal craniocaudal dimension.  Small volume perihepatic and perisplenic ascites.  Again noted is subocclusive thrombus within the splenic vein (series 502/image 23) and at the portosplenic confluence (series 502/image 65).  Pancreas and adrenal glands are within normal limits.  Numerous layering gallstones, without associated inflammatory changes.  Kidneys are within normal limits.  No hydronephrosis.  No suspicious abdominal lymphadenopathy.  No focal osseous lesions.  IMPRESSION: Three dominant hypoenhancing hepatic lesions in the right hepatic  lobe, measuring up to 2.6 cm as described above, decreased. Additional 1.9 cm lesion in the anterior right hepatic lobe has resolved.  1.4 cm lesion in the central left hepatic lobe, unchanged.  Cirrhosis with splenomegaly and stigmata of portal hypertension, as above. Indwelling TIPS shunt. Small volume abdominal ascites.  Cholelithiasis, without associated inflammatory changes.   Electronically Signed   By: Julian Hy M.D.   On: 07/18/2013 15:08   [place] Art/ven Flow Abd Pelv Doppler  08/06/2013   CLINICAL DATA:  TIPS creation, February 2010. Y 90 radio embolization of hepatoma 05/29/2013. Routine TIPS surveillance.  EXAM: DUPLEX ULTRASOUND OF LIVER AND TIPS SHUNT  TECHNIQUE: Color and duplex Doppler ultrasound was performed to evaluate the hepatic in-flow and out-flow vessels.  COMPARISON:  09/04/2012  FINDINGS: TIPS Shunt Velocities  Proximal:  77.9 cm/sec  Mid:  80  Distal:  79 cm/sec  Portal Vein Velocities (all hepatopetal)  Main:  33   cm/sec  Right:  33   cm/sec  Left:  39 cm/sec  Hepatic Vein Velocities  Right:    Not well visualized  Middle:  Hepatofugal, 40 cm/sec  Left:  Not visualized  Varices:  None seen  Ascites: Present  IMPRESSION: 1. Patent TIPS and liver vascular exam, with stable velocities, no evidence of stenosis or occlusion. 2. Abdominal ascites, new since previous exam.  One   Electronically Signed   By: Arne Cleveland M.D.   On: 08/06/2013 17:31   US Paracentesis  08/03/2013   CLINICAL DATA:  Hepatocellular carcinoma, cirrhosis, chronic kidney disease, ascites. Request is made for diagnostic and therapeutic paracentesis up to 2 liters.  EXAM: ULTRASOUND GUIDED DIAGNOSTIC AND THERAPEUTIC PARACENTESIS  COMPARISON:  None.  PROCEDURE: An ultrasound guided paracentesis was thoroughly discussed with the patient and questions answered. The benefits, risks, alternatives and complications were also discussed. The patient understands and wishes to proceed with the procedure. Written  consent was obtained.  Ultrasound was performed to localize and mark an adequate pocket of fluid in the right lower quadrant of the abdomen. The area was then prepped and draped in the normal sterile fashion. 1% Lidocaine was used for local anesthesia. Under ultrasound guidance a 19 gauge Yueh catheter was introduced. Paracentesis was performed. The catheter was removed and a dressing applied.  Complications: None.  FINDINGS: A total of approximately 1.2 liters of slightly turbid, yellow fluid was removed. A fluid sample was sent for laboratory analysis.  IMPRESSION: Successful ultrasound guided paracentesis yielding 1.2 liters of ascites.  Read by: Rowe Robert ,P.A.-C.   Electronically Signed   By: Markus Daft M.D.   On: 08/03/2013 11:28   Dg Chest Portable 1 View  08/02/2013   CLINICAL DATA:  Shortness of breath and loss of consciousness tonight. Altered mental status.  EXAM: PORTABLE CHEST - 1 VIEW  COMPARISON:  CT chest 06/28/2013.  Chest 06/21/2012.  FINDINGS: Postoperative changes in the mediastinum. Shallow inspiration. Cardiac enlargement with prominent pulmonary vascularity suggesting mild congestion. No edema or infiltration. No blunting of costophrenic angles. No pneumothorax. Calcification of the aorta.  IMPRESSION: Cardiac enlargement with mild pulmonary vascular congestion. No edema or infiltration.   Electronically Signed   By: Lucienne Capers M.D.   On: 08/02/2013 03:26   Dg Abd 2 Views  08/02/2013   CLINICAL DATA:  Status post small earlier today with loss of consciousness ; epigastric pain; history of previous appendectomy  EXAM: ABDOMEN - 2 VIEW  COMPARISON:  CT scan of the abdomen and pelvis of today's date  FINDINGS: There is a moderate burden throughout the colon. Are small amounts of small bowel gas. No free extraluminal gas collections. There is a TIPS appliance present in the liver. There is surgical wire suture to the right of the lumbar spine. There are degenerative changes of the  lumbar spine.  IMPRESSION: Increased stool burden is consistent with constipation. No obstruction or perforation is demonstrated.   Electronically Signed   By: David  Martinique   On: 08/02/2013 08:06    Microbiology: Recent Results (from the past 240 hour(s))  CULTURE, BLOOD (ROUTINE X 2)     Status: None   Collection Time    08/05/13  2:33 PM      Result Value Ref Range Status   Specimen Description BLOOD LEFT ANTECUBITAL   Final   Special Requests BOTTLES DRAWN AEROBIC AND ANAEROBIC 10CC   Final   Culture  Setup Time     Final   Value: 08/05/2013 19:37     Performed at [facility]   Culture     Final   Value: NO GROWTH 5 DAYS     Performed at [facility]   Report Status 08/11/2013 FINAL   Final  CLOSTRIDIUM DIFFICILE BY PCR     Status: Abnormal   Collection Time    08/05/13  2:38 PM      Result Value Ref Range Status   C difficile by pcr POSITIVE (*) NEGATIVE Final   Comment: CRITICAL  RESULT CALLED TO, READ BACK BY AND VERIFIED WITH:     L. DUNN RN 16:35 08/05/13 ([email or web address])  CULTURE, BLOOD (ROUTINE X 2)     Status: None   Collection Time    08/05/13  2:50 PM      Result Value Ref Range Status   Specimen Description BLOOD LEFT WRIST   Final   Special Requests BOTTLES DRAWN AEROBIC ONLY 1CC   Final   Culture  Setup Time     Final   Value: 08/05/2013 19:37     Performed at [facility]   Culture     Final   Value: NO GROWTH 5 DAYS     Performed at [facility]   Report Status 08/11/2013 FINAL   Final     Labs: Basic Metabolic Panel:  Recent Labs Lab 08/11/13 1710 08/11/13 1719 08/12/13 0453  NA 140 134* 140  K 5.2 4.6 5.2  CL 100 105 100  CO2 27  --  32  GLUCOSE 165* 162* 223*  BUN 27* 32* 30*  CREATININE 1.50* 1.60* 1.48*  CALCIUM 8.8  --  8.9   Liver Function Tests:  Recent Labs Lab 08/11/13 1710 08/12/13 0453  AST 68* 54*  ALT 31 26  ALKPHOS 238* 214*  BILITOT 2.3* 3.0*  PROT 5.8* 5.4*  ALBUMIN 2.9* 2.6*   No results  found for this basename: LIPASE, AMYLASE,  in the last 168 hours  Recent Labs Lab 08/11/13 1813  AMMONIA 41   CBC:  Recent Labs Lab 08/11/13 1710 08/11/13 1719 08/12/13 0453  WBC 4.6  --  3.6*  NEUTROABS 3.5  --  2.6  HGB 12.7* 13.3 11.0*  HCT 37.6* 39.0 34.1*  MCV 100.5*  --  102.1*  PLT 50*  --  38*   Cardiac Enzymes: No results found for this basename: CKTOTAL, CKMB, CKMBINDEX, TROPONINI,  in the last 168 hours BNP: BNP (last 3 results)  Recent Labs  08/02/13 0245  PROBNP 795.6*   CBG:  Recent Labs Lab 08/06/13 1730 08/11/13 2045 08/12/13 0702 08/12/13 1209 08/12/13 1639  GLUCAP 80 163* 191* 268* 284*       Signed:  VANN, JESSICA  [facility] 08/13/2013, 1:40 PM

## 2013-08-12 NOTE — Progress Notes (Signed)
UR complete.  Courtney Robarge RN, MSN 

## 2013-08-12 NOTE — Care Management Note (Signed)
    Page 1 of 1   08/12/2013     4:31:33 PM CARE MANAGEMENT NOTE 08/12/2013  Patient:  [name], [name]   Account Number:  [id number]  Date Initiated:  08/12/2013  Documentation initiated by:  Lorne Skeens  Subjective/Objective Assessment:   Patient was admitted with stroke-like symptoms.  Lives at home with spouse.     Action/Plan:   Will follow for discharge needs pending PT/OT evals and physician orders.   Anticipated DC Date:  08/12/2013   Anticipated DC Plan:  [place]  CM consult      Choice offered to / List presented to:  C-1 Patient        [place] arranged  [place]   Status of service:  In process, will continue to follow Medicare Important Message given?  NA - LOS <3 / Initial given by admissions (If response is "NO", the following Medicare IM given date fields will be blank) Date Medicare IM given:   Date Additional Medicare IM given:    Discharge Disposition:  [place]  Per UR Regulation:  Reviewed for med. necessity/level of care/duration of stay  If discussed at [place] of Stay Meetings, dates discussed:    Comments:  08/12/13 [place], MSN, CM- Met with patient and wife, who have chosen [facility]. Butch Penny with [facility] was notified and has accepted the referral for discharge home today.

## 2013-08-12 NOTE — Progress Notes (Signed)
Echocardiogram 2D Echocardiogram has been performed.  [name] 08/12/2013, 2:28 PM

## 2013-08-12 NOTE — Evaluation (Signed)
Occupational Therapy Evaluation Patient Details Name: [name] MRN: [id number] DOB: 1935/11/22 Today's Date: 08/12/2013    History of Present Illness 78 y.o. male admitted to [facility] on 08/11/13 with right sided weakness and fall suspicious of stroke.  Stroke workup pending.  MRI and CT negative for acute events.  Dx is suspected TIA vs medical complexity.  Pt with significant PMHx of  DM, CHF, SOB, R TKA, multiple lower back surgeries, CABG.    Clinical Impression   Pt. Is at baseline for ADLs per pt. And wife. Pt. Has been A with LE dressing secondary to pt. Edema in LE and back pain. Pt. Is s with toileting tasks which is also pt. Baseline. Pt. And wife are in agreement that pt. Does not need further OT.     Follow Up Recommendations  No OT follow up    Equipment Recommendations       Recommendations for Other Services       Precautions / Restrictions Precautions Precautions: Fall Precaution Comments: h/o falls Restrictions Weight Bearing Restrictions: No      Mobility Bed Mobility Overal bed mobility: Needs Assistance Bed Mobility: Supine to Sit     Supine to sit: Supervision;HOB elevated (used bed rail)     General bed mobility comments: Extra time needed to get to EOB, but no therapist external support provided.  Supervision for safety  Transfers Overall transfer level: Needs assistance Equipment used: 4-wheeled walker Transfers: Sit to/from Stand Sit to Stand: Min assist         General transfer comment:  (Pt. S with sit to stand from chair and commode. Pt. S to AMB)    Balance Overall balance assessment: Needs assistance Sitting-balance support: Feet supported;No upper extremity supported Sitting balance-Leahy Scale: Good     Standing balance support: Bilateral upper extremity supported Standing balance-Leahy Scale: Poor                              ADL Overall ADL's : At baseline                                        General ADL Comments:  (Pt. is at baseline for ADLs and for toileting. )     Vision                     Perception     Praxis      Pertinent Vitals/Pain No c/o pain.     Hand Dominance Right   Extremity/Trunk Assessment Upper Extremity Assessment Upper Extremity Assessment: Generalized weakness   Lower Extremity Assessment Lower Extremity Assessment: RLE deficits/detail;LLE deficits/detail RLE Deficits / Details: bil lower ext edema (pitting) R>L.  H/o saphenous vein graft in right leg with chronic edema R>L due to poor venous return.  Strength 3+/5 within available ROM (~10% decrease in extension ROM due to h/o R TKA on this side).  RLE Sensation: decreased light touch (R>L decrease, with h/o peripherial neuropathy.) LLE Deficits / Details: edema still present, but less than R leg, strength 3+/5 LLE Sensation: history of peripheral neuropathy   Cervical / Trunk Assessment Cervical / Trunk Assessment: Other exceptions Cervical / Trunk Exceptions: h/o low back surgeries (multiple) with chronic pain issues   Communication Communication Communication: HOH ("I need them, but don't have them")   Cognition Arousal/Alertness: Awake/alert Behavior During  Therapy: WFL for tasks assessed/performed Overall Cognitive Status: Within Functional Limits for tasks assessed                     General Comments       Exercises       Shoulder Instructions      Home Living Family/patient expects to be discharged to:: Private residence Living Arrangements: Spouse/significant other Available Help at Discharge: Family;Available 24 hours/day (son lives behind them) Type of Home: House Home Access: Stairs to enter [employer] of Steps: 5 Entrance Stairs-Rails: Right Home Layout: One level;Other (Comment) (with basement, doesn't go down there much)     Bathroom Shower/Tub: Occupational psychologist: Handicapped height Bathroom Accessibility:  Yes How Accessible: Accessible via walker Home Equipment: Bedside commode;Shower seat   Additional Comments: walk in shower curtain      Prior Functioning/Environment Level of Independence: Independent        Comments: used RW for 2 weeks, getting weak and unsteady, used cane inside and outside PTA before two weeks ago    OT Diagnosis:     OT Problem List:     OT Treatment/Interventions:      OT Goals(Current goals can be found in the care plan section) Acute Rehab OT Goals Patient Stated Goal: to go home.  OT Frequency:     Barriers to D/C:            Co-evaluation              End of Session Equipment Utilized During Treatment: Rolling walker  Activity Tolerance: Patient tolerated treatment well Patient left: in chair;with call bell/phone within reach;with chair alarm set   Time: 1240-1321 OT Time Calculation (min): 41 min Charges:  OT General Charges $OT Visit: 1 Procedure OT Evaluation $Initial OT Evaluation Tier I: 1 Procedure OT Treatments $Self Care/Home Management : 23-37 mins G-Codes:    HORVATH,STEPHANIE 21-Aug-2013, 1:19 PM

## 2013-08-13 ENCOUNTER — Ambulatory Visit: Payer: Medicare Other | Admitting: Infectious Diseases

## 2013-08-13 LAB — GLUCOSE, CAPILLARY: GLUCOSE-CAPILLARY: 284 mg/dL — AB (ref 70–99)

## 2013-08-13 NOTE — ED Provider Notes (Signed)
I saw and evaluated the patient, reviewed the resident's note and I agree with the findings and plan.   EKG Interpretation   Date/Time:  Sunday August 11 2013 17:23:29 EDT Ventricular Rate:  70 PR Interval:  203 QRS Duration: 95 QT Interval:  385 QTC Calculation: 415 R Axis:   -6 Text Interpretation:  Sinus rhythm No significant change since last  tracing Confirmed by Ashok Cordia  MD, Lennette Bihari ([id number]) on 08/11/2013 5:45:18 PM      Pt c/o acute onset right sided weakness. Code cva. Stat ct. Neurology has eval, states not candidate for tpa, but recommend medical admission. Labs. Med service contact.   Mirna Mires, MD 08/13/13 [phone]

## 2013-08-15 ENCOUNTER — Telehealth: Payer: Self-pay | Admitting: *Deleted

## 2013-08-15 NOTE — Telephone Encounter (Signed)
OK as verbal See Discharge Summary diagnoses

## 2013-08-15 NOTE — Telephone Encounter (Signed)
Left msg on triage requesting verbal order for skill nursing home eval for disease management & education & Medication teaching...[name]

## 2013-08-16 ENCOUNTER — Ambulatory Visit (INDEPENDENT_AMBULATORY_CARE_PROVIDER_SITE_OTHER): Payer: Medicare Other | Admitting: Internal Medicine

## 2013-08-16 ENCOUNTER — Encounter: Payer: Self-pay | Admitting: Internal Medicine

## 2013-08-16 VITALS — BP 118/58 | HR 69 | Temp 98.4°F | Wt 254.4 lb

## 2013-08-16 DIAGNOSIS — R404 Transient alteration of awareness: Secondary | ICD-10-CM

## 2013-08-16 DIAGNOSIS — C228 Malignant neoplasm of liver, primary, unspecified as to type: Secondary | ICD-10-CM

## 2013-08-16 DIAGNOSIS — K7290 Hepatic failure, unspecified without coma: Secondary | ICD-10-CM

## 2013-08-16 DIAGNOSIS — K7682 Hepatic encephalopathy: Secondary | ICD-10-CM

## 2013-08-16 DIAGNOSIS — R188 Other ascites: Secondary | ICD-10-CM

## 2013-08-16 DIAGNOSIS — R110 Nausea: Secondary | ICD-10-CM

## 2013-08-16 DIAGNOSIS — C220 Liver cell carcinoma: Secondary | ICD-10-CM

## 2013-08-16 DIAGNOSIS — I2510 Atherosclerotic heart disease of native coronary artery without angina pectoris: Secondary | ICD-10-CM

## 2013-08-16 NOTE — Progress Notes (Signed)
Pre visit review using our clinic review tool, if applicable. No additional management support is needed unless otherwise documented below in the visit note. 

## 2013-08-16 NOTE — Telephone Encounter (Signed)
Notified Joy with md response...Chryl Heck

## 2013-08-16 NOTE — Progress Notes (Signed)
   Subjective:    Patient ID: [name], male    DOB: [birth year], 78 y.o.   MRN: [id number]  HPI He's been seen following his most recent hospitalization. He was hospitalized 6/12 - 08/06/13 with acute mental status changes attributed to hepatic encephalopathy in the context of hepatocellular cancer for which he has received radiation &  seed implants. With  lactulose there was improvement. He was found have C. difficile colitis and placed on vancomycin. He was rehospitalized 6/21 - 08/13/13 with possible TIA process. The hospitalist questioned whether this simply were a manifestation of his progressive illness. Comorbidities include obstructive sleep apnea, coronary disease, and diabetes. The discharge summary was reviewed. Also palliative care's notes were reviewed. He was a DNR but he and his wife are not ready to pursue hospice.He was discharged with home health nurse and physical therapy. The latter was declined by the patient. "Routine" therapeutic paracentesis as out patient was recommended.     Review of Systems  He's on Reglan with meals and Zofran as needed. His nausea is not completely controlled despite these.  In addition to a fentanyl patch he takes Norco every four hours as needed. He continues to have abdominal pain which he attributes to his intra-abdominal ascites. He also has some reflux type symptoms and regurgitation without frank vomiting.     Objective:   Physical Exam  Significant or distinguishing  findings on physical exam are documented first.  Below that are other systems examined & findings.  General appearance is one of poor health and debilitation but w/o distress. Deformed , bulbous nose. Grade 1.5 systolic murmur. RLL rales which clear with deep inspiration. Massive abdomen with very large ventral hernia. Fusiform knees w/o effusion. Tense edema despite compression hose. Using cane. Wife is historian as he is subtley confused. Slight asterixis  . Bruising over forearms.  Eyes: No conjunctival inflammation or scleral icterus is present. Oral exam: Dental hygiene is good; lips and gums are healthy appearing.There is no oropharyngeal erythema or exudate noted.  Heart:  Normal rate and regular rhythm. S1 and S2 normal without gallop, click, rub or other extra sounds     Lungs:No increased work of breathing.   Abdomen:  No tenderness over the flanks to percussion  Musculoskeletal: Unable to lie flat and sit up without help.Unable to recline < 30 degrees. Negative straight leg raising bilaterally.  Skin:Warm & dry.  Intact without suspicious lesions or rashes ; no jaundice .  Lymphatic: No lymphadenopathy is noted about the head, neck, axilla              Assessment & Plan:  #1 hepatocellular cancer with ascites #2 hepatic encephalopathy; possible recent TIA GI consultation with Dr Carlean Purl as to "routine" OP paracentesis. Pursue Hospice as family willing to accept. OP labs next week by [facility].

## 2013-08-16 NOTE — Progress Notes (Signed)
   Subjective:    Patient ID: [name], male    DOB: 09/30/1935, 78 y.o.   MRN: [id number]  HPI The pt presents today to follow up after two recent hospital visits on 08/02/13 and 08/11/13. The patient has an extensive medical history including hepatocellular carcinoma, CAD, DM and most recently TIA.   The pt was hospitalized from 08/02/13-08/06/13 for AMS. He was found to have hepatic encephalopathy. The pt's lactulose dose was increased and he was tapped, where there removed 1.5L per pt's wife report.   The pt was hospitalized 08/11/13-08/13/13 for a stroke code.The pt had R sided stroke like symptoms. He did not receive TPA. The pt presumably had a TIA. He was discharged with a home health and home PT. The pt was seen by home health this week. The pt is to be seen by PT next week however the pt cancelled his appointment.   Today the pt presents to f/u. He is currently having increased abdominal pain as well as nausea. He has not vomited any gastric contents but the pt reports he has had some spit ups. He is taking reglan 5mg  with meals and zofran PRN. For the pain he is taking norco. He also has a fentanyl patch. He believes the pain may be due to his abdominal fluid.   Per d/c summary from the ED, will consider routine abdominal taps. Pt does see a gastroenterologist at [facility] (Dr. Leroy Kennedy).   Review of Systems     Objective:   Physical Exam        Assessment & Plan:

## 2013-08-18 DIAGNOSIS — E669 Obesity, unspecified: Secondary | ICD-10-CM | POA: Insufficient documentation

## 2013-08-18 DIAGNOSIS — C220 Liver cell carcinoma: Secondary | ICD-10-CM | POA: Insufficient documentation

## 2013-08-18 NOTE — Patient Instructions (Signed)
   Please take the narcotic pain medicines as needed rather than on a regular maintenance basis as these may have significant adverse effects on bowel function and urination.  Referral will be made to Gastroenterology to assess your persistent abdominal symptoms and the Reglan and Zofran medications. Additionally Dr Gaynelle Adu recommendation for routine paracentesis can be reviewed by Dr Carlean Purl.

## 2013-08-20 ENCOUNTER — Other Ambulatory Visit: Payer: Self-pay

## 2013-08-20 ENCOUNTER — Telehealth: Payer: Self-pay | Admitting: Internal Medicine

## 2013-08-20 DIAGNOSIS — R188 Other ascites: Secondary | ICD-10-CM

## 2013-08-20 NOTE — Telephone Encounter (Signed)
Order large volume paracentesis please Up to 5 L  Do cell count and diff on fluid

## 2013-08-20 NOTE — Telephone Encounter (Signed)
Pt scheduled for [place] para at [facility] 08/22/13@11am . Pt to arrive there at 10:45am. Pts wife aware of appt

## 2013-08-20 NOTE — Telephone Encounter (Signed)
Pts wife states that the pts abdomen in swollen and he is uncomfortable. Requests paracentesis. Dr. Carlean Purl please advise.

## 2013-08-22 ENCOUNTER — Telehealth: Payer: Self-pay | Admitting: Internal Medicine

## 2013-08-22 ENCOUNTER — Ambulatory Visit (HOSPITAL_COMMUNITY)
Admission: RE | Admit: 2013-08-22 | Discharge: 2013-08-22 | Disposition: A | Discharge status: Home / Self Care | Payer: Medicare Other | Source: Ambulatory Visit | Attending: Internal Medicine | Admitting: Internal Medicine

## 2013-08-22 ENCOUNTER — Telehealth: Payer: Self-pay | Admitting: *Deleted

## 2013-08-22 ENCOUNTER — Other Ambulatory Visit: Payer: Self-pay

## 2013-08-22 ENCOUNTER — Encounter: Payer: Self-pay | Admitting: Internal Medicine

## 2013-08-22 VITALS — BP 142/51

## 2013-08-22 DIAGNOSIS — R188 Other ascites: Secondary | ICD-10-CM | POA: Insufficient documentation

## 2013-08-22 DIAGNOSIS — K652 Spontaneous bacterial peritonitis: Secondary | ICD-10-CM

## 2013-08-22 DIAGNOSIS — C228 Malignant neoplasm of liver, primary, unspecified as to type: Secondary | ICD-10-CM | POA: Insufficient documentation

## 2013-08-22 DIAGNOSIS — N189 Chronic kidney disease, unspecified: Secondary | ICD-10-CM | POA: Insufficient documentation

## 2013-08-22 DIAGNOSIS — K7460 Unspecified cirrhosis of liver: Secondary | ICD-10-CM | POA: Insufficient documentation

## 2013-08-22 HISTORY — DX: Spontaneous bacterial peritonitis: K65.2

## 2013-08-22 LAB — BODY FLUID CELL COUNT WITH DIFFERENTIAL
Lymphs, Fluid: 8 %
Monocyte-Macrophage-Serous Fluid: 26 % — ABNORMAL LOW (ref 50–90)
NEUTROPHIL FLUID: 66 % — AB (ref 0–25)
Total Nucleated Cell Count, Fluid: 463 cu mm (ref 0–1000)

## 2013-08-22 MED ORDER — HYDROCODONE-ACETAMINOPHEN 5-325 MG PO TABS: 1.0000 | ORAL_TABLET | ORAL | Status: DC | PRN

## 2013-08-22 MED ORDER — CIPROFLOXACIN HCL 500 MG PO TABS: 500.0000 mg | ORAL_TABLET | Freq: Two times a day (BID) | ORAL | Status: DC

## 2013-08-22 NOTE — Telephone Encounter (Signed)
I spoke to her re; overall situation and that ascites shows SBP  1) cipro 500 mg bid x 10 days - Rxed 2) we will call again Mon re: how he is - he has home health etc but I do think it is reasonable to go into hospice - [name] is agreeable - will think some more 3) Take pain medicine as needed even if it alters mental status - has severe chronic back pain

## 2013-08-22 NOTE — Procedures (Signed)
US guided diagnostic/therapeutic paracentesis performed yielding 4.2 liters yellow fluid. No immediate complications. A portion of the fluid was sent to the lab for cell count and differential.

## 2013-08-22 NOTE — Telephone Encounter (Signed)
Requesting verbal order to see pt 2x's a week for 6 week...Johny Chess

## 2013-08-22 NOTE — Telephone Encounter (Signed)
OK Adult Failure to Thrive 

## 2013-08-22 NOTE — Telephone Encounter (Signed)
S/w wife and pt is not having any allergic reactions to norco. He has had 2 prior prescriptions. Dr Juliann Mule aware. Wife aware rx is ready for pickup.

## 2013-08-25 ENCOUNTER — Telehealth: Payer: Self-pay | Admitting: Internal Medicine

## 2013-08-25 NOTE — Telephone Encounter (Signed)
Home health nurse called regarding leaking post paracentesis. No other patient complaints. Soaking bandage, but not free flowing. Told to continue to bandage as needed, Should slow down. If not, contact office (may need tapped again to relieve pressure or ostomy bag if that doesn't help). Will forward to Dr Carlean Purl and his nurse

## 2013-08-26 ENCOUNTER — Encounter (HOSPITAL_COMMUNITY): Payer: Self-pay | Admitting: Emergency Medicine

## 2013-08-26 ENCOUNTER — Inpatient Hospital Stay (HOSPITAL_COMMUNITY): Payer: Medicare Other

## 2013-08-26 ENCOUNTER — Observation Stay (HOSPITAL_COMMUNITY)
Admission: EM | Admit: 2013-08-26 | Discharge: 2013-08-27 | Disposition: A | Discharge status: Hospice | Payer: Medicare Other | Attending: Internal Medicine | Admitting: Internal Medicine

## 2013-08-26 DIAGNOSIS — G4733 Obstructive sleep apnea (adult) (pediatric): Secondary | ICD-10-CM | POA: Insufficient documentation

## 2013-08-26 DIAGNOSIS — C220 Liver cell carcinoma: Secondary | ICD-10-CM

## 2013-08-26 DIAGNOSIS — D509 Iron deficiency anemia, unspecified: Secondary | ICD-10-CM | POA: Insufficient documentation

## 2013-08-26 DIAGNOSIS — R531 Weakness: Secondary | ICD-10-CM

## 2013-08-26 DIAGNOSIS — K7030 Alcoholic cirrhosis of liver without ascites: Secondary | ICD-10-CM | POA: Insufficient documentation

## 2013-08-26 DIAGNOSIS — I878 Other specified disorders of veins: Secondary | ICD-10-CM | POA: Diagnosis present

## 2013-08-26 DIAGNOSIS — E785 Hyperlipidemia, unspecified: Secondary | ICD-10-CM | POA: Insufficient documentation

## 2013-08-26 DIAGNOSIS — F1020 Alcohol dependence, uncomplicated: Secondary | ICD-10-CM | POA: Insufficient documentation

## 2013-08-26 DIAGNOSIS — Z87891 Personal history of nicotine dependence: Secondary | ICD-10-CM | POA: Insufficient documentation

## 2013-08-26 DIAGNOSIS — K7682 Hepatic encephalopathy: Secondary | ICD-10-CM | POA: Insufficient documentation

## 2013-08-26 DIAGNOSIS — K7460 Unspecified cirrhosis of liver: Secondary | ICD-10-CM | POA: Diagnosis present

## 2013-08-26 DIAGNOSIS — I872 Venous insufficiency (chronic) (peripheral): Secondary | ICD-10-CM | POA: Insufficient documentation

## 2013-08-26 DIAGNOSIS — I10 Essential (primary) hypertension: Secondary | ICD-10-CM | POA: Diagnosis present

## 2013-08-26 DIAGNOSIS — Z9089 Acquired absence of other organs: Secondary | ICD-10-CM | POA: Insufficient documentation

## 2013-08-26 DIAGNOSIS — K7469 Other cirrhosis of liver: Secondary | ICD-10-CM

## 2013-08-26 DIAGNOSIS — C228 Malignant neoplasm of liver, primary, unspecified as to type: Secondary | ICD-10-CM | POA: Insufficient documentation

## 2013-08-26 DIAGNOSIS — K7290 Hepatic failure, unspecified without coma: Secondary | ICD-10-CM | POA: Insufficient documentation

## 2013-08-26 DIAGNOSIS — Z79899 Other long term (current) drug therapy: Secondary | ICD-10-CM | POA: Insufficient documentation

## 2013-08-26 DIAGNOSIS — D696 Thrombocytopenia, unspecified: Secondary | ICD-10-CM | POA: Diagnosis present

## 2013-08-26 DIAGNOSIS — Z951 Presence of aortocoronary bypass graft: Secondary | ICD-10-CM | POA: Insufficient documentation

## 2013-08-26 DIAGNOSIS — I4891 Unspecified atrial fibrillation: Secondary | ICD-10-CM | POA: Insufficient documentation

## 2013-08-26 DIAGNOSIS — Z85820 Personal history of malignant melanoma of skin: Secondary | ICD-10-CM | POA: Insufficient documentation

## 2013-08-26 DIAGNOSIS — I359 Nonrheumatic aortic valve disorder, unspecified: Secondary | ICD-10-CM | POA: Insufficient documentation

## 2013-08-26 DIAGNOSIS — Z794 Long term (current) use of insulin: Secondary | ICD-10-CM | POA: Insufficient documentation

## 2013-08-26 DIAGNOSIS — I5030 Unspecified diastolic (congestive) heart failure: Secondary | ICD-10-CM | POA: Insufficient documentation

## 2013-08-26 DIAGNOSIS — D6959 Other secondary thrombocytopenia: Secondary | ICD-10-CM | POA: Insufficient documentation

## 2013-08-26 DIAGNOSIS — I350 Nonrheumatic aortic (valve) stenosis: Secondary | ICD-10-CM | POA: Diagnosis present

## 2013-08-26 DIAGNOSIS — N183 Chronic kidney disease, stage 3 unspecified: Secondary | ICD-10-CM | POA: Insufficient documentation

## 2013-08-26 DIAGNOSIS — R600 Localized edema: Secondary | ICD-10-CM

## 2013-08-26 DIAGNOSIS — I509 Heart failure, unspecified: Secondary | ICD-10-CM | POA: Insufficient documentation

## 2013-08-26 DIAGNOSIS — I2510 Atherosclerotic heart disease of native coronary artery without angina pectoris: Secondary | ICD-10-CM | POA: Insufficient documentation

## 2013-08-26 DIAGNOSIS — Z96659 Presence of unspecified artificial knee joint: Secondary | ICD-10-CM | POA: Insufficient documentation

## 2013-08-26 DIAGNOSIS — E782 Mixed hyperlipidemia: Secondary | ICD-10-CM | POA: Diagnosis present

## 2013-08-26 DIAGNOSIS — K219 Gastro-esophageal reflux disease without esophagitis: Secondary | ICD-10-CM | POA: Insufficient documentation

## 2013-08-26 DIAGNOSIS — E119 Type 2 diabetes mellitus without complications: Secondary | ICD-10-CM | POA: Insufficient documentation

## 2013-08-26 DIAGNOSIS — E860 Dehydration: Secondary | ICD-10-CM

## 2013-08-26 DIAGNOSIS — K224 Dyskinesia of esophagus: Secondary | ICD-10-CM | POA: Insufficient documentation

## 2013-08-26 DIAGNOSIS — I129 Hypertensive chronic kidney disease with stage 1 through stage 4 chronic kidney disease, or unspecified chronic kidney disease: Secondary | ICD-10-CM | POA: Insufficient documentation

## 2013-08-26 DIAGNOSIS — M109 Gout, unspecified: Secondary | ICD-10-CM | POA: Insufficient documentation

## 2013-08-26 DIAGNOSIS — R188 Other ascites: Principal | ICD-10-CM | POA: Insufficient documentation

## 2013-08-26 LAB — CBC
HEMATOCRIT: 37.7 % — AB (ref 39.0–52.0)
Hemoglobin: 12.5 g/dL — ABNORMAL LOW (ref 13.0–17.0)
MCH: 33.2 pg (ref 26.0–34.0)
MCHC: 33.2 g/dL (ref 30.0–36.0)
MCV: 100 fL (ref 78.0–100.0)
Platelets: 49 10*3/uL — ABNORMAL LOW (ref 150–400)
RBC: 3.77 MIL/uL — AB (ref 4.22–5.81)
RDW: 16.5 % — AB (ref 11.5–15.5)
WBC: 4.5 10*3/uL (ref 4.0–10.5)

## 2013-08-26 LAB — URINALYSIS, ROUTINE W REFLEX MICROSCOPIC
Bilirubin Urine: NEGATIVE
GLUCOSE, UA: NEGATIVE mg/dL
HGB URINE DIPSTICK: NEGATIVE
Ketones, ur: NEGATIVE mg/dL
Leukocytes, UA: NEGATIVE
Nitrite: NEGATIVE
Protein, ur: NEGATIVE mg/dL
Specific Gravity, Urine: 1.03 (ref 1.005–1.030)
Urobilinogen, UA: 0.2 mg/dL (ref 0.0–1.0)
pH: 5 (ref 5.0–8.0)

## 2013-08-26 LAB — GLUCOSE, CAPILLARY
GLUCOSE-CAPILLARY: 204 mg/dL — AB (ref 70–99)
GLUCOSE-CAPILLARY: 272 mg/dL — AB (ref 70–99)

## 2013-08-26 LAB — COMPREHENSIVE METABOLIC PANEL
ALK PHOS: 251 U/L — AB (ref 39–117)
ALT: 29 U/L (ref 0–53)
AST: 57 U/L — ABNORMAL HIGH (ref 0–37)
Albumin: 2.6 g/dL — ABNORMAL LOW (ref 3.5–5.2)
Anion gap: 10 (ref 5–15)
BILIRUBIN TOTAL: 2.4 mg/dL — AB (ref 0.3–1.2)
BUN: 23 mg/dL (ref 6–23)
CHLORIDE: 99 meq/L (ref 96–112)
CO2: 30 meq/L (ref 19–32)
CREATININE: 1.33 mg/dL (ref 0.50–1.35)
Calcium: 8.7 mg/dL (ref 8.4–10.5)
GFR calc non Af Amer: 51 mL/min — ABNORMAL LOW (ref >90)
GFR, EST AFRICAN AMERICAN: 59 mL/min — AB (ref >90)
GLUCOSE: 283 mg/dL — AB (ref 70–99)
POTASSIUM: 4 meq/L (ref 3.7–5.3)
Sodium: 139 mEq/L (ref 137–147)
Total Protein: 5.5 g/dL — ABNORMAL LOW (ref 6.0–8.3)

## 2013-08-26 LAB — PROTIME-INR
INR: 1.49 (ref 0.00–1.49)
PROTHROMBIN TIME: 18 s — AB (ref 11.6–15.2)

## 2013-08-26 LAB — AMMONIA: AMMONIA: 59 umol/L (ref 11–60)

## 2013-08-26 MED ORDER — SPIRONOLACTONE 50 MG PO TABS
50.0000 mg | ORAL_TABLET | Freq: Every morning | ORAL | Status: DC
  Administered 2013-08-27: 50 mg via ORAL
  Filled 2013-08-26: qty 1

## 2013-08-26 MED ORDER — ALLOPURINOL 150 MG HALF TABLET
150.0000 mg | ORAL_TABLET | Freq: Every morning | ORAL | Status: DC
  Administered 2013-08-27: 150 mg via ORAL
  Filled 2013-08-26: qty 1

## 2013-08-26 MED ORDER — ISOSORBIDE MONONITRATE ER 60 MG PO TB24
60.0000 mg | ORAL_TABLET | Freq: Every morning | ORAL | Status: DC
  Administered 2013-08-27: 60 mg via ORAL
  Filled 2013-08-26: qty 1

## 2013-08-26 MED ORDER — ONDANSETRON HCL 4 MG/2ML IJ SOLN
4.0000 mg | Freq: Once | INTRAMUSCULAR | Status: AC
  Administered 2013-08-26: 4 mg via INTRAVENOUS
  Filled 2013-08-26: qty 2

## 2013-08-26 MED ORDER — HYOSCYAMINE SULFATE 0.125 MG PO TBDP
0.1250 mg | ORAL_TABLET | ORAL | Status: DC | PRN
  Filled 2013-08-26: qty 1

## 2013-08-26 MED ORDER — SODIUM CHLORIDE 0.9 % IJ SOLN
3.0000 mL | Freq: Two times a day (BID) | INTRAMUSCULAR | Status: DC
  Administered 2013-08-26 – 2013-08-27 (×2): 3 mL via INTRAVENOUS

## 2013-08-26 MED ORDER — METOCLOPRAMIDE HCL 5 MG PO TABS
5.0000 mg | ORAL_TABLET | Freq: Three times a day (TID) | ORAL | Status: DC
  Administered 2013-08-26 – 2013-08-27 (×3): 5 mg via ORAL
  Filled 2013-08-26 (×7): qty 1

## 2013-08-26 MED ORDER — SODIUM CHLORIDE 0.9 % IV BOLUS (SEPSIS): 500.0000 mL | Freq: Once | INTRAVENOUS | Status: DC

## 2013-08-26 MED ORDER — INSULIN ASPART 100 UNIT/ML ~~LOC~~ SOLN
0.0000 [IU] | Freq: Every day | SUBCUTANEOUS | Status: DC
Start: 2013-08-26 — End: 2013-08-27
  Administered 2013-08-26: 3 [IU] via SUBCUTANEOUS

## 2013-08-26 MED ORDER — SODIUM CHLORIDE 0.9 % IV SOLN
INTRAVENOUS | Status: DC
  Administered 2013-08-26: 12:00:00 via INTRAVENOUS

## 2013-08-26 MED ORDER — ONDANSETRON HCL 4 MG PO TABS
8.0000 mg | ORAL_TABLET | Freq: Two times a day (BID) | ORAL | Status: DC | PRN
Start: 2013-08-26 — End: 2013-08-27

## 2013-08-26 MED ORDER — CIPROFLOXACIN HCL 500 MG PO TABS
500.0000 mg | ORAL_TABLET | Freq: Two times a day (BID) | ORAL | Status: DC
  Administered 2013-08-26 – 2013-08-27 (×2): 500 mg via ORAL
  Filled 2013-08-26 (×4): qty 1

## 2013-08-26 MED ORDER — INSULIN ASPART 100 UNIT/ML ~~LOC~~ SOLN
0.0000 [IU] | Freq: Three times a day (TID) | SUBCUTANEOUS | Status: DC
  Administered 2013-08-26 – 2013-08-27 (×2): 3 [IU] via SUBCUTANEOUS
  Administered 2013-08-27: 5 [IU] via SUBCUTANEOUS

## 2013-08-26 MED ORDER — FUROSEMIDE 40 MG PO TABS
40.0000 mg | ORAL_TABLET | Freq: Two times a day (BID) | ORAL | Status: DC
  Administered 2013-08-26 – 2013-08-27 (×2): 40 mg via ORAL
  Filled 2013-08-26 (×4): qty 1

## 2013-08-26 MED ORDER — TAMSULOSIN HCL 0.4 MG PO CAPS
0.4000 mg | ORAL_CAPSULE | Freq: Every evening | ORAL | Status: DC
  Administered 2013-08-26: 0.4 mg via ORAL
  Filled 2013-08-26 (×2): qty 1

## 2013-08-26 MED ORDER — INSULIN ASPART 100 UNIT/ML ~~LOC~~ SOLN
20.0000 [IU] | Freq: Three times a day (TID) | SUBCUTANEOUS | Status: DC
  Administered 2013-08-26 – 2013-08-27 (×3): 20 [IU] via SUBCUTANEOUS

## 2013-08-26 MED ORDER — FENTANYL 100 MCG/HR TD PT72
100.0000 ug | MEDICATED_PATCH | TRANSDERMAL | Status: DC
  Administered 2013-08-26: 100 ug via TRANSDERMAL
  Filled 2013-08-26: qty 1

## 2013-08-26 MED ORDER — METOPROLOL TARTRATE 25 MG PO TABS
25.0000 mg | ORAL_TABLET | Freq: Two times a day (BID) | ORAL | Status: DC
  Administered 2013-08-26 – 2013-08-27 (×2): 25 mg via ORAL
  Filled 2013-08-26 (×3): qty 1

## 2013-08-26 MED ORDER — HYDROCODONE-ACETAMINOPHEN 5-325 MG PO TABS
1.0000 | ORAL_TABLET | Freq: Four times a day (QID) | ORAL | Status: DC | PRN
  Administered 2013-08-27: 2 via ORAL
  Filled 2013-08-26: qty 2

## 2013-08-26 MED ORDER — PREDNISONE 5 MG PO TABS
5.0000 mg | ORAL_TABLET | Freq: Every day | ORAL | Status: DC
  Administered 2013-08-27: 5 mg via ORAL
  Filled 2013-08-26 (×2): qty 1

## 2013-08-26 MED ORDER — RIFAXIMIN 550 MG PO TABS
550.0000 mg | ORAL_TABLET | Freq: Two times a day (BID) | ORAL | Status: DC
  Administered 2013-08-26 – 2013-08-27 (×2): 550 mg via ORAL
  Filled 2013-08-26 (×4): qty 1

## 2013-08-26 MED ORDER — FERROUS GLUCONATE 324 (38 FE) MG PO TABS
324.0000 mg | ORAL_TABLET | Freq: Two times a day (BID) | ORAL | Status: DC
  Administered 2013-08-26 – 2013-08-27 (×2): 324 mg via ORAL
  Filled 2013-08-26 (×3): qty 1

## 2013-08-26 NOTE — Procedures (Signed)
US guided therapeutic paracentesis performed yielding 2.7 liters slightly turbid, yellow fluid. No immediate complications.

## 2013-08-26 NOTE — Telephone Encounter (Signed)
Agree ccing Dr. Ardis Hughs

## 2013-08-26 NOTE — Progress Notes (Addendum)
  CARE MANAGEMENT ED NOTE 08/26/2013  Patient:  [name], [name]   Account Number:  [id number]  Date Initiated:  08/26/2013  Documentation initiated by:  Jackelyn Poling  Subjective/Objective Assessment:   78 yr old medicare/mutual of [place] medicare/generic commercial covered [place]) coming from home with c/o abdominal distention, weakness, PMH hepatic cancer s/p paracentesis on August 22 2013     Subjective/Objective Assessment Detail:   Last hospitalizatoin at [facility] for dx alcohol cirrhosis  wife at bedside states she spoke with Dr Lovena Le during 07/2013 hospitalization Pt is presently receiving a paracentesis per spouse & ED RN Pt is followed by [facility] for [facility] health services Call from [facility] -She was informed of pt admission for ascites/paracentesis 08/26/13      Action/Plan:   ED Cm consulted by ED [place], Zigmund Daniel, Noted CM consult for hospice services. Offered choice to wife at bedside. Offered lists of home hospice and hospice facilities Choice decision is pending speaking with Dr Lovena Le per wife   Action/Plan Detail:   [phone] [place] spoke with Evette at hospice [phone] Left message for [place], hospice RN [phone] spoke with [facility] of Hospice about referral Confirmed choice was offered to the spouse at bedside To see pt/wife on [phone]   Anticipated DC Date:  08/29/2013     Status Recommendation to Physician:   Result of Recommendation:    Other ED Services  Consult Working Plan   In-house referral  Hospice / [place]  Other  Outpatient Services - Pt will follow up    Choice offered to / List presented to:  C-3 Spouse          Status of service:  Completed, signed off  ED Comments:   ED Comments Detail:

## 2013-08-26 NOTE — ED Provider Notes (Signed)
CSN: [id number]     Arrival date & time 08/26/13  1057 History   First MD Initiated Contact with Patient 08/26/13 1103     Chief Complaint  Patient presents with  . Weakness     (Consider location/radiation/quality/duration/timing/severity/associated sxs/prior Treatment) Patient is a 78 y.o. male presenting with weakness. The history is provided by the patient and the spouse.  Weakness Pertinent negatives include no chest pain, no abdominal pain, no headaches and no shortness of breath.  pt with hx cirrhosis, hepatocellular ca, c/o generalized weakness.  States had tried radiation tx earlier in year as palliative measure. No chemo. States last week had 4 liters fluid removed via paracentesis.  States felt better post procedure.  Was called and told concern SBP, and so is on cipro.  Pt w hx cdiff, also tx for hepatic encephalopathy, so stools have been loose. Denies severe diarrhea. No abd pain currently. Nausea, poor po intake, and decreased urine output in past few days. Spouse says generally so weak that legs arent holding him, cant get up and ambulate on own.  Pt notes chronic right lower leg swelling and erythema - states txd multiple x w abx for cellulitis, no recent change in appearance.  Spouse also notes she feels ascites has reaccumulated since paracentesis.  Pt/family called their doctor today who requested they come to hospital for admission, also discussed possible hospice eval when in hospital.     Past Medical History  Diagnosis Date  . Diverticulosis   . Gastric antral vascular ectasia   . Rhinophyma   . CAD (coronary artery disease)     myoview 2007 no ischemia/ no ASA because of GI disease  . Atrial fibrillation     amiodarone in past, but problems and left off meds.  . Aortic stenosis     echo, September, 2010, mild aortic insufficiency  . Visual loss, one eye     right eye-ophthalmic arterial branch occlusion  . Internal hemorrhoids   . GERD (gastroesophageal reflux  disease)   . Hypertension   . Allergic rhinitis   . Anemia     multifactorial  . Cirrhosis, alcoholic   . Thrombocytopenia   . Bleeding esophageal varices   . Encephalopathy, unspecified   . Gout   . Short-segment Barrett's esophagus     suspected  . Venous insufficiency   . Ejection fraction     EF 55%, echo, September, 2010  . Carotid bruit     Dopplers okay in the past  . Cellulitis     recurrent to RLE/notes 06/21/2012  . CHF (congestive heart failure)   . Sinus bradycardia     January, 2014  . Diastolic CHF     January, 2014  . Complication of anesthesia     "quit breathing once a long time ago; dropped my BP too; it was after I was given Dilaudid" (06/21/2012)  . Heart murmur   . Anginal pain   . Shortness of breath     "stay that way most of the time now" (06/21/2012)  . OSA on CPAP   . Diabetes mellitus, type 2   . History of blood transfusion     "not related to OR" (06/21/2012)  . H/O hiatal hernia   . Osteoarthritis     "ALL OVER" (06/21/2012)  . Chronic lower back pain   . Aspiration into airway-silent on modified barium swallow 03/20/2013  . Melanoma     resected by [facility] 2008  . Skin cancer  Basal cell and squamous cell  . Esophageal dysmotility 10/09/2012  . SBP (spontaneous bacterial peritonitis) 08/22/2013    On paracentesis .,08/22/2013 cipro Rx Will consider suppressive Tx    Past Surgical History  Procedure Laterality Date  . Total knee arthroplasty Right   . Esophagogastroduodenoscopy  04/10/2008    esophageal varices, cardia varix  . Tips procedure  2/10  . Back surgery      X5 "for ruptured disc; last time did a fusion" (06/21/2012)  . Excisional hemorrhoidectomy    . Release scar contracture / graft repairs of hand      bilaterally   . Elbow surgery Right   . Hiatal hernia repair    . Colonoscopy w/ polypectomy  02/14/2007    adenomatous polyps, diverticulosis, internal hemorrhoids/rectal varices  . Cataract extraction, bilateral  January 2013  .  Tonsillectomy    . Appendectomy    . Joint replacement    . Coronary artery bypass graft  1993    6 Bypass  . Cataract extraction w/ intraocular lens  implant, bilateral     Family History  Problem Relation Age of Onset  . Alzheimer's disease Mother   . Cancer Father     GI (stomach?)  . Heart disease Sister     2 sisters  . Liver disease Sister   . Diabetes Brother   . Heart disease Brother     3 brothers  . Liver disease Brother   . Heart attack Other     sibling  . Melanoma Other     sibling  . Stroke Other     sibling  . Colon cancer Neg Hx    History  Substance Use Topics  . Smoking status: Former Smoker -- 2.00 packs/day for 20 years    Types: Cigarettes    Quit date: 02/22/1975  . Smokeless tobacco: Never Used  . Alcohol Use: No     Comment: 06/21/2012 "none since Feb 2010"    Review of Systems  Constitutional: Negative for fever and chills.  HENT: Negative for sore throat.   Eyes: Negative for redness.  Respiratory: Negative for cough and shortness of breath.   Cardiovascular: Positive for leg swelling. Negative for chest pain and palpitations.  Gastrointestinal: Positive for nausea and diarrhea. Negative for vomiting and abdominal pain.  Genitourinary: Negative for flank pain.  Musculoskeletal: Negative for back pain and neck pain.  Skin: Negative for rash.  Neurological: Positive for weakness. Negative for headaches.  Hematological: Does not bruise/bleed easily.  Psychiatric/Behavioral: Positive for confusion.      Allergies  Hydromorphone hcl; Shellfish-derived products; Doxycycline hyclate; Amoxicillin-pot clavulanate; Aspirin; Buprenorphine hcl-naloxone hcl; Ezetimibe; Meperidine hcl; Morphine; Morphine sulfate; Oxycodone; Sulfonamide derivatives; and Codeine  Home Medications   Prior to Admission medications   Medication Sig Start Date End Date Taking? Authorizing Provider  allopurinol (ZYLOPRIM) 300 MG tablet Take 150 mg by mouth every  morning.    Yes Historical Provider, MD  Ascorbic Acid (VITAMIN C) 500 MG tablet Take 500 mg by mouth every morning.    Yes Historical Provider, MD  Calcium-Magnesium-Zinc (CALCIUM/MAGNESIUM/ZINC FORMULA) 1000-500-50 MG TABS Take 1 tablet by mouth every morning.    Yes Historical Provider, MD  ciprofloxacin (CIPRO) 500 MG tablet Take 500 mg by mouth 2 (two) times daily. 08/22/13 09/01/13 Yes Historical Provider, MD  fentaNYL (DURAGESIC - DOSED MCG/HR) 100 MCG/HR Place 100 mcg onto the skin every 3 (three) days.   Yes Historical Provider, MD  Ferrous Gluconate (IRON) 240 (27  FE) MG TABS Take 1 tablet by mouth 2 (two) times daily.    Yes Historical Provider, MD  furosemide (LASIX) 40 MG tablet Take 40 mg by mouth 2 (two) times daily.   Yes Historical Provider, MD  HYDROcodone-acetaminophen (NORCO/VICODIN) 5-325 MG per tablet Take 1-2 tablets by mouth every 6 (six) hours as needed for moderate pain.   Yes Historical Provider, MD  hyoscyamine (ANASPAZ) 0.125 MG TBDP disintergrating tablet Place 0.125 mg under the tongue every 4 (four) hours as needed for bladder spasms or cramping.    Yes Historical Provider, MD  insulin aspart (NOVOLOG) 100 UNIT/ML injection Inject 15-25 Units into the skin 3 (three) times daily before meals. 25 units with breakfast and dinner. 15 units with lunch. Only use insulin if >50% of meal is eaten.   Yes Historical Provider, MD  isosorbide mononitrate (IMDUR) 60 MG 24 hr tablet Take 60 mg by mouth every morning.    Yes Historical Provider, MD  lactulose (CHRONULAC) 10 GM/15ML solution Take 20 g by mouth 2 (two) times daily.   Yes Historical Provider, MD  loratadine (CLARITIN) 10 MG tablet Take 10 mg by mouth every morning.    Yes Historical Provider, MD  metoCLOPramide (REGLAN) 5 MG tablet Take 5 mg by mouth 4 (four) times daily -  before meals and at bedtime.   Yes Historical Provider, MD  metoprolol tartrate (LOPRESSOR) 25 MG tablet Take 25 mg by mouth 2 (two) times daily.   Yes  Historical Provider, MD  omeprazole (PRILOSEC OTC) 20 MG tablet Take 20 mg by mouth every morning.    Yes Historical Provider, MD  ondansetron (ZOFRAN) 8 MG tablet Take 8 mg by mouth every 12 (twelve) hours as needed for nausea or vomiting.   Yes Historical Provider, MD  pantoprazole (PROTONIX) 40 MG tablet Take 40 mg by mouth every morning.   Yes Historical Provider, MD  polyethylene glycol (MIRALAX / GLYCOLAX) packet Take 17 g by mouth daily as needed for mild constipation.    Yes Historical Provider, MD  predniSONE (DELTASONE) 5 MG tablet Take 5 mg by mouth every morning.    Yes Historical Provider, MD  rifaximin (XIFAXAN) 550 MG TABS tablet Take 550 mg by mouth 2 (two) times daily.   Yes Historical Provider, MD  spironolactone (ALDACTONE) 50 MG tablet Take 50 mg by mouth every morning.   Yes Historical Provider, MD  tamsulosin (FLOMAX) 0.4 MG CAPS capsule Take 0.4 mg by mouth every evening.   Yes Historical Provider, MD  Thiamine HCl (VITAMIN B-1) 250 MG tablet Take 125 mg by mouth every morning.    Yes Historical Provider, MD  triamcinolone cream (KENALOG) 0.1 % Apply 1 application topically at bedtime.    Yes Historical Provider, MD  nitroGLYCERIN (NITROSTAT) 0.4 MG SL tablet Place 0.4 mg under the tongue every 5 (five) minutes as needed for chest pain.    Historical Provider, MD   BP 138/56  Pulse 73  Temp(Src) 98.5 F (36.9 C)  Resp 20  SpO2 100% Physical Exam  Nursing note and vitals reviewed. Constitutional: He is oriented to person, place, and time. He appears well-developed and well-nourished. No distress.  HENT:  Head: Atraumatic.  Mouth/Throat: Oropharynx is clear and moist.  Eyes: Conjunctivae are normal. Pupils are equal, round, and reactive to light. No scleral icterus.  Neck: Neck supple. No tracheal deviation present.  Cardiovascular: Normal rate, regular rhythm, normal heart sounds and intact distal pulses.   Pulmonary/Chest: Effort normal and breath sounds  normal. No  accessory muscle usage. No respiratory distress.  Abdominal: Soft. He exhibits no distension and no mass. There is no tenderness. There is no rebound and no guarding.  +ascites.   Genitourinary:  No cva tenderness  Musculoskeletal: Normal range of motion. He exhibits no edema and no tenderness.  Bilateral lower leg edema, R>L, erythema and increased warmth to anterior aspect right lower leg.     Neurological: He is alert and oriented to person, place, and time.  Skin: Skin is warm and dry. No rash noted. He is not diaphoretic.  Psychiatric: He has a normal mood and affect.    ED Course  Procedures (including critical care time) Labs Review    EKG Interpretation   Date/Time:  Monday August 26 2013 11:06:05 EDT Ventricular Rate:  71 PR Interval:  195 QRS Duration: 103 QT Interval:  396 QTC Calculation: 430 R Axis:   -6 Text Interpretation:  Sinus rhythm No significant change since last  tracing Confirmed by Arilynn Blakeney  MD, Lennette Bihari ([id number]) on 08/26/2013 11:13:25 AM      MDM  Iv ns 500 cc bolus.  zofran po.  Labs.  Reviewed nursing notes and prior charts for additional history.   Med service contacted.  Pt states stools recently always loose, on lactulose, recent c diff (completed 2 weeks po vanc), and now recent new abx/cipro - if pt stools, will send for c diff.   [facility] - will admit.      Mirna Mires, MD 08/26/13 1325

## 2013-08-26 NOTE — Telephone Encounter (Signed)
Spoke with patient's wife and she states the paracentesis site is still draining. She reports she changed the dressing 5 times yesterday and it also wet his bed. It is not draining as much this AM. Patient is confused and having whole body tremors. Stomach is distended as much as it was prior to paracentesis. Urine output is decreased. She is asking for Hospice referral. Spoke with Dr. Carlean Purl and he recommends hospitalization to get patient stable then hospice. Patient's wife notified of Dr. Celesta Aver recommendation. She will take patient the [facility] ED to be evaluated/admitted.

## 2013-08-26 NOTE — Progress Notes (Signed)
Spoke to Dr. Karleen Hampshire regarding admission status

## 2013-08-26 NOTE — Telephone Encounter (Signed)
[name] no answer LMOM with md response...[name]

## 2013-08-26 NOTE — ED Notes (Addendum)
Patient is aware that we need a urine specimen, urinal at bedside. 

## 2013-08-26 NOTE — ED Notes (Signed)
Dr. Ashok Cordia is aware that patient has still not been able to urinate. Patient given water, per Dr Ashok Cordia. RN Ysidro Evert notified.

## 2013-08-26 NOTE — ED Notes (Signed)
Per EMS, pt coming from home with c/o generalized weakness and abdominal distention. EMS reports pt has hx of hepatic cancer and had paracentesis on Thursday. Pt is DNR and family is trying to arrange hospice care.

## 2013-08-26 NOTE — H&P (Addendum)
History and Physical    [name]:[id number] DOB: 09-Jun-1935 DOA: 08/26/2013  Referring physician: Dr. Ashok Cordia PCP: Unice Cobble, MD  Specialists: none   Chief Complaint: abdominal distention  HPI: [name] is a 78 y.o. male has a past medical history significant for liver cirrhosis, HCC s/p TACE, followed by Dr. Juliann Mule, history of TIPS 5 years ago, A fib, HTN, HLD, recurrent C diff infections, recurrent RLE cellulitis, recent SBP after paracentesis 4 days ago, presents to the ED with progressive abdominal distention, weakness, diarrhea, and continuous leakage from his most recent paracentesis site. He denies fever or chills, endorses mild nausea without vomiting. He had a paracentesis on 7/2 and was told he has SBP and started on Ciprofloxacin which he has been on for 3 days. He has had multiple hospitalizations in the past 2-3 months, most recent one 3 weeks ago with C diff diarrhea and he just finished a 14 day course last week. He endorses diarrhea currently but is on lactulose also. Appreciates slight worsening. No blood in his stool. He endorses generalized weakness. States that his RLE is at baseline and he and his wife do not appreciate worsening. He reports compliance with his home medications. They are asking to be set up with hospice.   Review of Systems: as per HPI otherwise negative  Past Medical History  Diagnosis Date  . Diverticulosis   . Gastric antral vascular ectasia   . Rhinophyma   . CAD (coronary artery disease)     myoview 2007 no ischemia/ no ASA because of GI disease  . Atrial fibrillation     amiodarone in past, but problems and left off meds.  . Aortic stenosis     echo, September, 2010, mild aortic insufficiency  . Visual loss, one eye     right eye-ophthalmic arterial branch occlusion  . Internal hemorrhoids   . GERD (gastroesophageal reflux disease)   . Hypertension   . Allergic rhinitis   . Anemia     multifactorial  . Cirrhosis, alcoholic     . Thrombocytopenia   . Bleeding esophageal varices   . Encephalopathy, unspecified   . Gout   . Short-segment Barrett's esophagus     suspected  . Venous insufficiency   . Ejection fraction     EF 55%, echo, September, 2010  . Carotid bruit     Dopplers okay in the past  . Cellulitis     recurrent to RLE/notes 06/21/2012  . CHF (congestive heart failure)   . Sinus bradycardia     January, 2014  . Diastolic CHF     January, 2014  . Complication of anesthesia     "quit breathing once a long time ago; dropped my BP too; it was after I was given Dilaudid" (06/21/2012)  . Heart murmur   . Anginal pain   . Shortness of breath     "stay that way most of the time now" (06/21/2012)  . OSA on CPAP   . Diabetes mellitus, type 2   . History of blood transfusion     "not related to OR" (06/21/2012)  . H/O hiatal hernia   . Osteoarthritis     "ALL OVER" (06/21/2012)  . Chronic lower back pain   . Aspiration into airway-silent on modified barium swallow 03/20/2013  . Melanoma     resected by [facility] 2008  . Skin cancer     Basal cell and squamous cell  . Esophageal dysmotility 10/09/2012  . SBP (  spontaneous bacterial peritonitis) 08/22/2013    On paracentesis .,08/22/2013 cipro Rx Will consider suppressive Tx    Past Surgical History  Procedure Laterality Date  . Total knee arthroplasty Right   . Esophagogastroduodenoscopy  04/10/2008    esophageal varices, cardia varix  . Tips procedure  2/10  . Back surgery      X5 "for ruptured disc; last time did a fusion" (06/21/2012)  . Excisional hemorrhoidectomy    . Release scar contracture / graft repairs of hand      bilaterally   . Elbow surgery Right   . Hiatal hernia repair    . Colonoscopy w/ polypectomy  02/14/2007    adenomatous polyps, diverticulosis, internal hemorrhoids/rectal varices  . Cataract extraction, bilateral  January 2013  . Tonsillectomy    . Appendectomy    . Joint replacement    . Coronary artery bypass graft  1993    6  Bypass  . Cataract extraction w/ intraocular lens  implant, bilateral     Social History:  reports that he quit smoking about 38 years ago. His smoking use included Cigarettes. He has a 40 pack-year smoking history. He has never used smokeless tobacco. He reports that he does not drink alcohol or use illicit drugs.  Allergies  Allergen Reactions  . Hydromorphone Hcl Shortness Of Breath and Other (See Comments)    Per wife "pt developed respiratory distress"  . Shellfish-Derived Products Rash    angioedema  . Doxycycline Hyclate Other (See Comments)    Diarrhea   . Amoxicillin-Pot Clavulanate Other (See Comments)    Unsure what reaction was but pt tolerated penicillin 05/25/2012  . Aspirin Other (See Comments)    REACTION: unable to tolerate due to cirrhosis and varices  . Buprenorphine Hcl-Naloxone Hcl Other (See Comments)    unresponsive  . Ezetimibe Other (See Comments)    REACTION: LEG/JOINT PAIN  . Meperidine Hcl Other (See Comments)    Per wife "his blood pressure dropped."  . Morphine Other (See Comments)    Per wife "pt sees spider webs."  . Morphine Sulfate Other (See Comments)    hallucinations  . Oxycodone Other (See Comments)    hallucinations  . Sulfonamide Derivatives Nausea And Vomiting  . Codeine Rash    Family History  Problem Relation Age of Onset  . Alzheimer's disease Mother   . Cancer Father     GI (stomach?)  . Heart disease Sister     2 sisters  . Liver disease Sister   . Diabetes Brother   . Heart disease Brother     3 brothers  . Liver disease Brother   . Heart attack Other     sibling  . Melanoma Other     sibling  . Stroke Other     sibling  . Colon cancer Neg Hx    Prior to Admission medications   Medication Sig Start Date End Date Taking? Authorizing Provider  allopurinol (ZYLOPRIM) 300 MG tablet Take 150 mg by mouth every morning.    Yes Historical Provider, MD  Ascorbic Acid (VITAMIN C) 500 MG tablet Take 500 mg by mouth every  morning.    Yes Historical Provider, MD  Calcium-Magnesium-Zinc (CALCIUM/MAGNESIUM/ZINC FORMULA) 1000-500-50 MG TABS Take 1 tablet by mouth every morning.    Yes Historical Provider, MD  ciprofloxacin (CIPRO) 500 MG tablet Take 500 mg by mouth 2 (two) times daily. 08/22/13 09/01/13 Yes Historical Provider, MD  fentaNYL (DURAGESIC - DOSED MCG/HR) 100 MCG/HR Place 100 mcg  onto the skin every 3 (three) days.   Yes Historical Provider, MD  Ferrous Gluconate (IRON) 240 (27 FE) MG TABS Take 1 tablet by mouth 2 (two) times daily.    Yes Historical Provider, MD  furosemide (LASIX) 40 MG tablet Take 40 mg by mouth 2 (two) times daily.   Yes Historical Provider, MD  HYDROcodone-acetaminophen (NORCO/VICODIN) 5-325 MG per tablet Take 1-2 tablets by mouth every 6 (six) hours as needed for moderate pain.   Yes Historical Provider, MD  hyoscyamine (ANASPAZ) 0.125 MG TBDP disintergrating tablet Place 0.125 mg under the tongue every 4 (four) hours as needed for bladder spasms or cramping.    Yes Historical Provider, MD  insulin aspart (NOVOLOG) 100 UNIT/ML injection Inject 15-25 Units into the skin 3 (three) times daily before meals. 25 units with breakfast and dinner. 15 units with lunch. Only use insulin if >50% of meal is eaten.   Yes Historical Provider, MD  isosorbide mononitrate (IMDUR) 60 MG 24 hr tablet Take 60 mg by mouth every morning.    Yes Historical Provider, MD  lactulose (CHRONULAC) 10 GM/15ML solution Take 20 g by mouth 2 (two) times daily.   Yes Historical Provider, MD  loratadine (CLARITIN) 10 MG tablet Take 10 mg by mouth every morning.    Yes Historical Provider, MD  metoCLOPramide (REGLAN) 5 MG tablet Take 5 mg by mouth 4 (four) times daily -  before meals and at bedtime.   Yes Historical Provider, MD  metoprolol tartrate (LOPRESSOR) 25 MG tablet Take 25 mg by mouth 2 (two) times daily.   Yes Historical Provider, MD  omeprazole (PRILOSEC OTC) 20 MG tablet Take 20 mg by mouth every morning.    Yes  Historical Provider, MD  ondansetron (ZOFRAN) 8 MG tablet Take 8 mg by mouth every 12 (twelve) hours as needed for nausea or vomiting.   Yes Historical Provider, MD  pantoprazole (PROTONIX) 40 MG tablet Take 40 mg by mouth every morning.   Yes Historical Provider, MD  polyethylene glycol (MIRALAX / GLYCOLAX) packet Take 17 g by mouth daily as needed for mild constipation.    Yes Historical Provider, MD  predniSONE (DELTASONE) 5 MG tablet Take 5 mg by mouth every morning.    Yes Historical Provider, MD  rifaximin (XIFAXAN) 550 MG TABS tablet Take 550 mg by mouth 2 (two) times daily.   Yes Historical Provider, MD  spironolactone (ALDACTONE) 50 MG tablet Take 50 mg by mouth every morning.   Yes Historical Provider, MD  tamsulosin (FLOMAX) 0.4 MG CAPS capsule Take 0.4 mg by mouth every evening.   Yes Historical Provider, MD  Thiamine HCl (VITAMIN B-1) 250 MG tablet Take 125 mg by mouth every morning.    Yes Historical Provider, MD  triamcinolone cream (KENALOG) 0.1 % Apply 1 application topically at bedtime.    Yes Historical Provider, MD  nitroGLYCERIN (NITROSTAT) 0.4 MG SL tablet Place 0.4 mg under the tongue every 5 (five) minutes as needed for chest pain.    Historical Provider, MD   Physical Exam: Filed Vitals:   08/26/13 1130 08/26/13 1200 08/26/13 1230 08/26/13 1300  BP: 141/61 139/58 138/56 129/80  Pulse: 68 67 70 66  Temp:      Resp:   20   SpO2: 99% 99% 98% 100%    General:  NAD, pleasant male  Eyes: no scleral icterus  ENT: moist oropharynx  Neck: no JVD  Cardiovascular: regular rate, 3/6 SEM  Respiratory: CTA biL, good air movement without  wheezing, rhonchi or crackled  Abdomen: distended, ascites present, leaking from last paracentesis   Skin: chronic venous stasis changes bilateral LE R>L  Musculoskeletal: 1-2 + pitting edema  Psychiatric: normal mood and affect  Neurologic: non focal, asterixis present, AxOx4  Labs on Admission:  Basic Metabolic  Panel:  Recent Labs Lab 08/26/13 1136  NA 139  K 4.0  CL 99  CO2 30  GLUCOSE 283*  BUN 23  CREATININE 1.33  CALCIUM 8.7   Liver Function Tests:  Recent Labs Lab 08/26/13 1136  AST 57*  ALT 29  ALKPHOS 251*  BILITOT 2.4*  PROT 5.5*  ALBUMIN 2.6*    Recent Labs Lab 08/26/13 1136  AMMONIA 59   CBC:  Recent Labs Lab 08/26/13 1136  WBC 4.5  HGB 12.5*  HCT 37.7*  MCV 100.0  PLT 49*   BNP (last 3 results)  Recent Labs  08/02/13 0245  PROBNP 795.6*   Radiological Exams on Admission: No results found.  EKG: Independently reviewed. Sinus rhythm.   Assessment/Plan Active Problems:   HYPERLIPIDEMIA   ANEMIA, IRON DEFICIENCY   THROMBOCYTOPENIA, CHRONIC   HYPERTENSION, ESSENTIAL NOS   CIRRHOSIS, ALCOHOLIC   Hepatic encephalopathy    ASCITES   CORONARY ARTERY BYPASS GRAFT, HX OF   CAD (coronary artery disease)   Atrial fibrillation   DM2 (diabetes mellitus, type 2)   Aortic stenosis   Diastolic CHF   Venous stasis of lower extremity   Esophageal dysmotility   HCC (hepatocellular carcinoma)   Decompensated hepatic cirrhosis   CKD (chronic kidney disease) stage 3, GFR 30-59 ml/min   Progressive abdominal distention - in the setting of decompensated cirrhosis, have asked for a repeat US paracentesis. Continue diuretics, close monitoring of his renal function - albumin if > 3L removed - s/p TIPS, most recent US 6/16 shows patent TIPS SBP - last paracentesis 7/2 with 463 whites and 66% segs c/w SBP, continue Ciprofloxacin Liver cirrhosis - with decompensation, continue home medications Recurrent C. Diff infection - check C diff. He is on Cipro. Ongoing diarrhea, will hold lactulose.  DM2 - home regimen per Dr. Loanne Drilling note novolog 25 am, 15 lunch, 45 pm, will start lower at 20 TID here plus sliding scale.  A fib - continue medications, now in sinus, monitor on telemetry and d/c tele in 24 hours if no events CAD s/p CABG - no chest pain, resume home  meds Chronic anemia and thrombocytopenia - due to liver disease, malignancy. CKD stage II-III - monitor Cr   Diet: heart Fluids: none  DVT Prophylaxis: SCDs  Code Status: DNR  Family Communication: wife at bedside  Disposition Plan: inpatient  Time spent: 59  This note has been created with Surveyor, quantity. Any transcriptional errors are unintentional.   Tieisha Darden M. Cruzita Lederer, MD [facility] Pager [phone]  If 7PM-7AM, please contact night-coverage [email or web address] Password [email or web address] 08/26/2013, 2:01 PM

## 2013-08-26 NOTE — Progress Notes (Signed)
Spoke to K. Schorr NP regarding admission status.

## 2013-08-26 NOTE — ED Notes (Signed)
Bed: QD82 Expected date:  Expected time:  Means of arrival:  Comments: EMS- 77yo, abdominal distention, weakness, CA Pt

## 2013-08-26 NOTE — Progress Notes (Signed)
UR completed 

## 2013-08-26 NOTE — Telephone Encounter (Signed)
We will call from office today

## 2013-08-27 ENCOUNTER — Telehealth: Payer: Self-pay | Admitting: Internal Medicine

## 2013-08-27 ENCOUNTER — Inpatient Hospital Stay (HOSPITAL_COMMUNITY): Payer: Medicare Other

## 2013-08-27 LAB — CLOSTRIDIUM DIFFICILE BY PCR: Toxigenic C. Difficile by PCR: NEGATIVE

## 2013-08-27 LAB — GLUCOSE, CAPILLARY
Glucose-Capillary: 228 mg/dL — ABNORMAL HIGH (ref 70–99)
Glucose-Capillary: 253 mg/dL — ABNORMAL HIGH (ref 70–99)

## 2013-08-27 LAB — PATHOLOGIST SMEAR REVIEW

## 2013-08-27 NOTE — Discharge Summary (Signed)
Physician Discharge Summary  [name] SFK:[id number] DOB: 1935/05/30 DOA: 08/26/2013  PCP: Unice Cobble, MD  Admit date: 08/26/2013 Discharge date: 08/27/2013  Time spent: 35 minutes  Recommendations for Outpatient Follow-up:  1. Follow up with hospice services 2. Follow up with Dr. Carlean Purl as needed   Discharge Diagnoses:  Active Problems:   HYPERLIPIDEMIA   ANEMIA, IRON DEFICIENCY   THROMBOCYTOPENIA, CHRONIC   HYPERTENSION, ESSENTIAL NOS   CIRRHOSIS, ALCOHOLIC   Hepatic encephalopathy    ASCITES   CORONARY ARTERY BYPASS GRAFT, HX OF   CAD (coronary artery disease)   Atrial fibrillation   DM2 (diabetes mellitus, type 2)   Aortic stenosis   Diastolic CHF   Venous stasis of lower extremity   Esophageal dysmotility   HCC (hepatocellular carcinoma)   Decompensated hepatic cirrhosis   CKD (chronic kidney disease) stage 3, GFR 30-59 ml/min  Discharge Condition: guarded, with hospice  Diet recommendation: as tolerated, low salt  Filed Weights   08/26/13 1650  Weight: 111.1 kg (244 lb 14.9 oz)   History of present illness:  [name] is a 78 y.o. male has a past medical history significant for liver cirrhosis, HCC s/p TACE, followed by Dr. Juliann Mule, history of TIPS 5 years ago, A fib, HTN, HLD, recurrent C diff infections, recurrent RLE cellulitis, recent SBP after paracentesis 4 days ago, presents to the ED with progressive abdominal distention, weakness, diarrhea, and continuous leakage from his most recent paracentesis site. He denies fever or chills, endorses mild nausea without vomiting. He had a paracentesis on 7/2 and was told he has SBP and started on Ciprofloxacin which he has been on for 3 days. He has had multiple hospitalizations in the past 2-3 months, most recent one 3 weeks ago with C diff diarrhea and he just finished a 14 day course last week. He endorses diarrhea currently but is on lactulose also. Appreciates slight worsening. No blood in his stool. He  endorses generalized weakness. States that his RLE is at baseline and he and his wife do not appreciate worsening. He reports compliance with his home medications. They are asking to be set up with hospice.   Hospital Course:  Progressive abdominal distention - in the setting of decompensated cirrhosis, s/p repeat paracentesis 7/6  - persistent leakage from his paracentesis last week, IR saw patient today and applied dermabond.  - ?consideration of a permanent palliative drain once SBP is treated  SBP - last paracentesis 7/2 with 463 whites and 66% segs c/w SBP, continue Ciprofloxacin as per prior to admission plans Liver cirrhosis - with decompensation, continue home medications  Recurrent C. Diff infection - C diff negative DM2 - continue previous home regimen. A fib - continue medications, now in sinus, no events CAD s/p CABG - no chest pain, resume home meds  Chronic anemia and thrombocytopenia - due to liver disease, malignancy.  CKD stage II-III - monitor Cr  Procedures:  None    Consultations:  None   Discharge Exam: Filed Vitals:   08/26/13 2100 08/26/13 2246 08/27/13 0453 08/27/13 1342  BP: 137/72 130/60 136/49 125/60  Pulse: 79 70 61 63  Temp: 98.6 F (37 C)  97.8 F (36.6 C) 97.7 F (36.5 C)  TempSrc: Oral  Oral Oral  Resp: 16  16 18   Height:      Weight:      SpO2: 98%  100% 98%    General: NAD Cardiovascular: RRR Respiratory: CTA biL  Discharge Instructions  Medication List    STOP taking these medications       omeprazole 20 MG tablet  Commonly known as:  PRILOSEC OTC     pantoprazole 40 MG tablet  Commonly known as:  PROTONIX     vancomycin 50 mg/mL oral solution  Commonly known as:  VANCOCIN      TAKE these medications       allopurinol 300 MG tablet  Commonly known as:  ZYLOPRIM  Take 150 mg by mouth every morning.     CALCIUM/MAGNESIUM/ZINC FORMULA 1000-500-50 MG Tabs  Generic drug:  Calcium-Magnesium-Zinc  Take 1 tablet by  mouth every morning.     ciprofloxacin 500 MG tablet  Commonly known as:  CIPRO  Take 500 mg by mouth 2 (two) times daily.     fentaNYL 100 MCG/HR  Commonly known as:  DURAGESIC - dosed mcg/hr  Place 100 mcg onto the skin every 3 (three) days.     furosemide 40 MG tablet  Commonly known as:  LASIX  Take 40 mg by mouth 2 (two) times daily.     HYDROcodone-acetaminophen 5-325 MG per tablet  Commonly known as:  NORCO/VICODIN  Take 1-2 tablets by mouth every 6 (six) hours as needed for moderate pain.     hyoscyamine 0.125 MG Tbdp disintergrating tablet  Commonly known as:  ANASPAZ  Place 0.125 mg under the tongue every 4 (four) hours as needed for bladder spasms or cramping.     insulin aspart 100 UNIT/ML injection  Commonly known as:  novoLOG  Inject 15-25 Units into the skin 3 (three) times daily before meals. 25 units with breakfast and dinner. 15 units with lunch. Only use insulin if >50% of meal is eaten.     Iron 240 (27 FE) MG Tabs  Take 1 tablet by mouth 2 (two) times daily.     isosorbide mononitrate 60 MG 24 hr tablet  Commonly known as:  IMDUR  Take 60 mg by mouth every morning.     lactulose 10 GM/15ML solution  Commonly known as:  CHRONULAC  Take 20 g by mouth 2 (two) times daily.     loratadine 10 MG tablet  Commonly known as:  CLARITIN  Take 10 mg by mouth every morning.     metoCLOPramide 5 MG tablet  Commonly known as:  REGLAN  Take 5 mg by mouth 4 (four) times daily -  before meals and at bedtime.     metoprolol tartrate 25 MG tablet  Commonly known as:  LOPRESSOR  Take 25 mg by mouth 2 (two) times daily.     nitroGLYCERIN 0.4 MG SL tablet  Commonly known as:  NITROSTAT  Place 0.4 mg under the tongue every 5 (five) minutes as needed for chest pain.     ondansetron 8 MG tablet  Commonly known as:  ZOFRAN  Take 8 mg by mouth every 12 (twelve) hours as needed for nausea or vomiting.     polyethylene glycol packet  Commonly known as:  MIRALAX /  GLYCOLAX  Take 17 g by mouth daily as needed for mild constipation.     predniSONE 5 MG tablet  Commonly known as:  DELTASONE  Take 5 mg by mouth every morning.     rifaximin 550 MG Tabs tablet  Commonly known as:  XIFAXAN  Take 550 mg by mouth 2 (two) times daily.     spironolactone 50 MG tablet  Commonly known as:  ALDACTONE  Take 50 mg by mouth every  morning.     tamsulosin 0.4 MG Caps capsule  Commonly known as:  FLOMAX  Take 0.4 mg by mouth every evening.     triamcinolone cream 0.1 %  Commonly known as:  KENALOG  Apply 1 application topically at bedtime.     vitamin B-1 250 MG tablet  Take 125 mg by mouth every morning.     vitamin C 500 MG tablet  Commonly known as:  ASCORBIC ACID  Take 500 mg by mouth every morning.         The results of significant diagnostics from this hospitalization (including imaging, microbiology, ancillary and laboratory) are listed below for reference.    Significant Diagnostic Studies: Ct Abdomen Pelvis Wo Contrast  08/05/2013   CLINICAL DATA:  Abdominal pain and distention.  EXAM: CT ABDOMEN AND PELVIS WITHOUT CONTRAST  TECHNIQUE: Multidetector CT imaging of the abdomen and pelvis was performed following the standard protocol without IV contrast.  COMPARISON:  CT of the abdomen and pelvis performed 08/02/2013, and MRI of the abdomen performed 07/18/2013  FINDINGS: A small left pleural effusion is noted, with associated atelectasis. Diffuse coronary artery calcifications are seen.  Diffuse cirrhotic changes again noted at the liver. Vague masses within the liver reflect the patient's known history of hepatocellular carcinoma. The patient's TIPS is incompletely assessed without contrast. The spleen is significantly enlarged, measuring 17.8 cm in length. Stones are seen layering dependently within the gallbladder; the gallbladder is otherwise unremarkable in appearance, though not well assessed due to ascites.  Small to moderate volume ascites is  noted within the abdomen and pelvis. Known thrombus within the portal venous system is not well assessed without contrast. Vague soft tissue inflammation within the mesentery of the upper abdomen is nonspecific but appears grossly stable.  The kidneys are unremarkable in appearance. There is no evidence of hydronephrosis. No renal or ureteral stones are seen. No perinephric stranding is appreciated.  No free fluid is identified. The small bowel is unremarkable in appearance. The stomach is within normal limits. No acute vascular abnormalities are seen. Relatively diffuse calcification is seen along the abdominal aorta and its branches.  The appendix is filled with air and is unremarkable in appearance. There is no evidence for appendicitis. Fatty infiltration is noted at the ileocecal valve. Contrast progresses to the level of the transverse colon. Scattered diverticulosis is seen along the descending and proximal sigmoid colon, without evidence of diverticulitis.  The bladder is decompressed, with a Foley catheter in place. The prostate remains normal in size. No inguinal lymphadenopathy is seen.  No acute osseous abnormalities are identified.  IMPRESSION: 1. Small to moderate volume ascites again noted within the abdomen and pelvis, perhaps mildly increased from the recent prior CT. 2. Hepatic cirrhosis again noted; known multifocal hepatocellular carcinoma is better characterized on prior MRI. The TIPS is incompletely assessed without contrast; known thrombus within the portal venous system is difficult to assess without contrast. 3. Splenomegaly again noted. 4. Cholelithiasis noted. 5. Scattered diverticulosis along the descending and proximal sigmoid colon, without evidence of diverticulitis. 6. Relatively diffuse calcification along the abdominal aorta and its branches. 7. Diffuse coronary artery calcifications seen. 8. Small left pleural effusion, with associated atelectasis.   Electronically Signed   By:  Garald Balding M.D.   On: 08/05/2013 01:45   Ct Abdomen Pelvis Wo Contrast  08/02/2013   CLINICAL DATA:  Fall with abdominal pain. History of cirrhosis with multifocal HCC post TIPS and Y-90 treatment.  EXAM: CT ABDOMEN AND  PELVIS WITHOUT CONTRAST  TECHNIQUE: Multidetector CT imaging of the abdomen and pelvis was performed following the standard protocol without IV contrast.  COMPARISON:  MRI 07/18/2013 and CT 06/28/2013 as well as 03/21/2013  FINDINGS: Lung bases demonstrate a small amount of left pleural fluid with mild dependent bibasilar atelectasis. Sternotomy wires are present. There is calcification in the region of the mitral valve annulus. Is increased density over the region of the aortic valve which may represent of prosthetic valve.  Abdominal images demonstrate a small nodular liver compatible with patient's known cirrhosis. There are 2 ill-defined hypodense masses over the film of the right lobe measuring 3 cm and 2.7 cm respectively without significant change. Remaining known liver lesions are better defined on the recent MRI. Patient's portosystemic shunt is unchanged. There is moderate cholelithiasis. The pancreas and adrenal glands are within normal. Kidneys are normal in size without hydronephrosis or nephrolithiasis. There is mild splenomegaly unchanged. There is evidence of mild ascites which is slightly worse. There is no free peritoneal air. Appendix is normal. There is moderate diverticulosis of the colon. There is moderate calcified plaque involving the abdominal aorta and iliac arteries.  Pelvic images demonstrate a mild-to-moderate amount of free fluid with Hounsfield unit measurements of 18 likely related to patient's ascites secondary to liver disease and not secondary to trauma. Remaining pelvic structures are unremarkable.  There are moderate degenerative changes of the spine with mild degenerative change of the hips. There is no acute fracture.  IMPRESSION: No acute injury in the  abdomen/pelvis.  Evidence of known cirrhosis with sequelae of portal hypertension with splenomegaly and ascites. There has been slight interval worsening of patient's ascites. Known multifocal hepatic cellular carcinoma without significant change and stable portosystemic shunt.  Small amount of left pleural fluid with bibasilar atelectatic change.  Diverticulosis of the colon.  Moderate cholelithiasis.   Electronically Signed   By: Marin Olp M.D.   On: 08/02/2013 08:20   Dg Chest 2 View  08/12/2013   CLINICAL DATA:  Stroke.  Fall  EXAM: CHEST  2 VIEW  COMPARISON:  08/04/2013  FINDINGS: Previous median sternotomy and CABG procedure. Normal heart size. There is a small left pleural effusion. Pulmonary vascular congestion is noted. No airspace consolidation.  IMPRESSION: 1. Small left pleural effusion. 2. Pulmonary vascular congestion   Electronically Signed   By: Kerby Moors M.D.   On: 08/12/2013 08:02   Dg Shoulder Right  08/02/2013   CLINICAL DATA:  Fall, pain.  EXAM: RIGHT SHOULDER - 2+ VIEW  COMPARISON:  None.  FINDINGS: No acute fracture or dislocation. The humeral head is normally aligned within the glenoid. AC joint is approximated. Subarticular spurring noted at the [facility] joint. No periarticular calcification. No soft tissue abnormality.  Osseous mineralization is normal.  IMPRESSION: No acute fracture or dislocation.   Electronically Signed   By: Jeannine Boga M.D.   On: 08/02/2013 06:28   Ct Head Wo Contrast  08/11/2013   CLINICAL DATA:  Code stroke. Right-sided facial droop and right arm drift.  EXAM: CT HEAD WITHOUT CONTRAST  TECHNIQUE: Contiguous axial images were obtained from the base of the skull through the vertex without intravenous contrast.  COMPARISON:  Head CT 08/02/2013.  FINDINGS: Mild cerebral atrophy. Patchy areas of mild decreased attenuation are noted throughout the deep and periventricular white matter of the cerebral hemispheres bilaterally, compatible with mild  chronic microvascular ischemic disease. No acute intracranial abnormalities. Specifically, no evidence of acute intracranial hemorrhage, no definite findings of  acute/subacute cerebral ischemia, no mass, mass effect, hydrocephalus or abnormal intra or extra-axial fluid collections. Visualized paranasal sinuses and mastoids are well pneumatized. No acute displaced skull fractures are identified.  IMPRESSION: 1. No acute intracranial abnormalities. 2. Mild cerebral atrophy with mild chronic microvascular ischemic changes in the cerebral white matter. These results were called by telephone at the time of interpretation on 08/11/2013 at 5:25 PM to Dr. Armida Sans, who verbally acknowledged these results.   Electronically Signed   By: Vinnie Langton M.D.   On: 08/11/2013 17:25   Ct Head Wo Contrast  08/02/2013   CLINICAL DATA:  Lethargy  EXAM: CT HEAD WITHOUT CONTRAST  TECHNIQUE: Contiguous axial images were obtained from the base of the skull through the vertex without intravenous contrast.  COMPARISON:  04/17/2010  FINDINGS: Mild atrophic changes are seen. Mild chronic white matter ischemic change is again identified. No findings to suggest acute hemorrhage, acute infarction or space-occupying mass lesion are noted. The bony calvarium is intact.  IMPRESSION: Chronic changes without acute abnormality.   Electronically Signed   By: Inez Catalina M.D.   On: 08/02/2013 08:03   Mr Brain Wo Contrast  08/11/2013   CLINICAL DATA:  Right hemi paresis.  EXAM: MRI HEAD WITHOUT CONTRAST  TECHNIQUE: Multiplanar, multiecho pulse sequences of the brain and surrounding structures were obtained without intravenous contrast.  COMPARISON:  CT of the head August 11, 2013  FINDINGS: Mild motion degraded examination. No reduced diffusion to suggest acute ischemia. [place] susceptibility artifact to suggest hemorrhage. No midline shift or mass effect.  The ventricles and sulci are normal for patient's age. Patchy predominately supratentorial  white matter T2 hyperintensities.  No abnormal extra-axial fluid collections. Normal major intracranial vascular flow voids seen at the skull base though, mild dolichoectasia of the intracranial vessels suggests chronic hypertension.  Visualized paranasal sinuses and mastoid air cells are well aerated. Status post bilateral ocular lens implants. The sella is not expanded. No cerebellar tonsillar ectopia. No suspicious bone marrow signal.  IMPRESSION: No acute intracranial process, specifically no acute ischemia.  Involutional changes. Moderate white matter changes suggest chronic small vessel ischemic disease in a background of dolicoectatic intracranial vessels which can be seen with chronic hypertension.   Electronically Signed   By: Elon Alas   On: 08/11/2013 22:17   [place] Art/ven Flow Abd Pelv Doppler  08/06/2013   CLINICAL DATA:  TIPS creation, February 2010. Y 90 radio embolization of hepatoma 05/29/2013. Routine TIPS surveillance.  EXAM: DUPLEX ULTRASOUND OF LIVER AND TIPS SHUNT  TECHNIQUE: Color and duplex Doppler ultrasound was performed to evaluate the hepatic in-flow and out-flow vessels.  COMPARISON:  09/04/2012  FINDINGS: TIPS Shunt Velocities  Proximal:  77.9 cm/sec  Mid:  80  Distal:  79 cm/sec  Portal Vein Velocities (all hepatopetal)  Main:  33   cm/sec  Right:  33   cm/sec  Left:  39 cm/sec  Hepatic Vein Velocities  Right:    Not well visualized  Middle:  Hepatofugal, 40 cm/sec  Left:  Not visualized  Varices:  None seen  Ascites: Present  IMPRESSION: 1. Patent TIPS and liver vascular exam, with stable velocities, no evidence of stenosis or occlusion. 2. Abdominal ascites, new since previous exam.  One   Electronically Signed   By: Arne Cleveland M.D.   On: 08/06/2013 17:31   US Paracentesis  08/26/2013   CLINICAL DATA:  Hepatocellular carcinoma, cirrhosis, prior TIPS, recurrent ascites. Request is made for therapeutic paracentesis.  EXAM: ULTRASOUND GUIDED  THERAPEUTIC  PARACENTESIS   COMPARISON:  PRIOR PARACENTESIS ON 08/22/2013  PROCEDURE: An ultrasound guided paracentesis was thoroughly discussed with the patient and questions answered. The benefits, risks, alternatives and complications were also discussed. The patient understands and wishes to proceed with the procedure. Written consent was obtained.  Ultrasound was performed to localize and mark an adequate pocket of fluid in the left lower quadrant of the abdomen. The area was then prepped and draped in the normal sterile fashion. 1% Lidocaine was used for local anesthesia. Under ultrasound guidance a 19 gauge Yueh catheter was introduced. Paracentesis was performed. The catheter was removed and a dressing applied.  Complications: None.  FINDINGS: A total of approximately 2.7 liters of slightly turbid, yellow fluid was removed.  IMPRESSION: Successful ultrasound guided therapeutic paracentesis yielding 2.7 liters of ascites.  Read by: Rowe Robert ,P.A.-C.   Electronically Signed   By: Daryll Brod M.D.   On: 08/26/2013 15:13   US Paracentesis  08/22/2013   CLINICAL DATA:  Hepatocellular carcinoma, cirrhosis, chronic kidney disease, recurrent ascites. Request is made for diagnostic and therapeutic paracentesis up to 5 liters.  EXAM: ULTRASOUND GUIDED DIAGNOSTIC AND THERAPEUTIC PARACENTESIS  COMPARISON:  PRIOR PARACENTESIS ON 08/03/2013  PROCEDURE: An ultrasound guided paracentesis was thoroughly discussed with the patient and questions answered. The benefits, risks, alternatives and complications were also discussed. The patient understands and wishes to proceed with the procedure. Written consent was obtained.  Ultrasound was performed to localize and mark an adequate pocket of fluid in the right lower quadrant of the abdomen. The area was then prepped and draped in the normal sterile fashion. 1% Lidocaine was used for local anesthesia. Under ultrasound guidance a 19 gauge Yueh catheter was introduced. Paracentesis was performed. The  catheter was removed and a dressing applied.  Complications: None.  FINDINGS: A total of approximately 4.2 liters of yellow fluid was removed. A fluid sample was sent for laboratory analysis.  IMPRESSION: Successful ultrasound guided diagnostic and therapeutic paracentesis yielding 4.2 liters of ascites.  Read by: Rowe Robert ,P.A.-C.   Electronically Signed   By: Arne Cleveland M.D.   On: 08/22/2013 12:06   US Paracentesis  08/03/2013   CLINICAL DATA:  Hepatocellular carcinoma, cirrhosis, chronic kidney disease, ascites. Request is made for diagnostic and therapeutic paracentesis up to 2 liters.  EXAM: ULTRASOUND GUIDED DIAGNOSTIC AND THERAPEUTIC PARACENTESIS  COMPARISON:  None.  PROCEDURE: An ultrasound guided paracentesis was thoroughly discussed with the patient and questions answered. The benefits, risks, alternatives and complications were also discussed. The patient understands and wishes to proceed with the procedure. Written consent was obtained.  Ultrasound was performed to localize and mark an adequate pocket of fluid in the right lower quadrant of the abdomen. The area was then prepped and draped in the normal sterile fashion. 1% Lidocaine was used for local anesthesia. Under ultrasound guidance a 19 gauge Yueh catheter was introduced. Paracentesis was performed. The catheter was removed and a dressing applied.  Complications: None.  FINDINGS: A total of approximately 1.2 liters of slightly turbid, yellow fluid was removed. A fluid sample was sent for laboratory analysis.  IMPRESSION: Successful ultrasound guided paracentesis yielding 1.2 liters of ascites.  Read by: Rowe Robert ,P.A.-C.   Electronically Signed   By: Markus Daft M.D.   On: 08/03/2013 11:28   Dg Chest Port 1 View  08/27/2013   CLINICAL DATA:  Shortness of breath.  EXAM: PORTABLE CHEST - 1 VIEW  COMPARISON:  August 11, 2013.  FINDINGS:  Stable cardiomediastinal silhouette. Sternotomy wires are again noted. No pneumothorax is noted.  Stable minimal left pleural effusion. No acute pulmonary disease is noted.  IMPRESSION: Stable minimal left pleural effusion. No other significant abnormality seen in the chest.   Electronically Signed   By: Sabino Dick M.D.   On: 08/27/2013 12:25   Dg Chest Portable 1 View  08/02/2013   CLINICAL DATA:  Shortness of breath and loss of consciousness tonight. Altered mental status.  EXAM: PORTABLE CHEST - 1 VIEW  COMPARISON:  CT chest 06/28/2013.  Chest 06/21/2012.  FINDINGS: Postoperative changes in the mediastinum. Shallow inspiration. Cardiac enlargement with prominent pulmonary vascularity suggesting mild congestion. No edema or infiltration. No blunting of costophrenic angles. No pneumothorax. Calcification of the aorta.  IMPRESSION: Cardiac enlargement with mild pulmonary vascular congestion. No edema or infiltration.   Electronically Signed   By: Lucienne Capers M.D.   On: 08/02/2013 03:26   Dg Abd 2 Views  08/02/2013   CLINICAL DATA:  Status post small earlier today with loss of consciousness ; epigastric pain; history of previous appendectomy  EXAM: ABDOMEN - 2 VIEW  COMPARISON:  CT scan of the abdomen and pelvis of today's date  FINDINGS: There is a moderate burden throughout the colon. Are small amounts of small bowel gas. No free extraluminal gas collections. There is a TIPS appliance present in the liver. There is surgical wire suture to the right of the lumbar spine. There are degenerative changes of the lumbar spine.  IMPRESSION: Increased stool burden is consistent with constipation. No obstruction or perforation is demonstrated.   Electronically Signed   By: David  Martinique   On: 08/02/2013 08:06    Microbiology: Recent Results (from the past 240 hour(s))  CLOSTRIDIUM DIFFICILE BY PCR     Status: None   Collection Time    08/27/13  9:22 AM      Result Value Ref Range Status   C difficile by pcr NEGATIVE  NEGATIVE Final   Comment: Performed at [place]: Basic  Metabolic Panel:  Recent Labs Lab 08/26/13 1136  NA 139  K 4.0  CL 99  CO2 30  GLUCOSE 283*  BUN 23  CREATININE 1.33  CALCIUM 8.7   Liver Function Tests:  Recent Labs Lab 08/26/13 1136  AST 57*  ALT 29  ALKPHOS 251*  BILITOT 2.4*  PROT 5.5*  ALBUMIN 2.6*    Recent Labs Lab 08/26/13 1136  AMMONIA 59   CBC:  Recent Labs Lab 08/26/13 1136  WBC 4.5  HGB 12.5*  HCT 37.7*  MCV 100.0  PLT 49*   BNP: BNP (last 3 results)  Recent Labs  08/02/13 0245  PROBNP 795.6*   CBG:  Recent Labs Lab 08/26/13 1722 08/26/13 2100 08/27/13 0718 08/27/13 1139  GLUCAP 204* 272* 228* 253*    Signed:  Denitra Donaghey  [facility] 08/27/2013, 2:58 PM

## 2013-08-27 NOTE — Discharge Instructions (Signed)
You were cared for by a hospitalist during your hospital stay. If you have any questions about your discharge medications or the care you received while you were in the hospital after you are discharged, you can call the unit and asked to speak with the hospitalist on call if the hospitalist that took care of you is not available. Once you are discharged, your primary care physician will handle any further medical issues. Please note that NO REFILLS for any discharge medications will be authorized once you are discharged, as it is imperative that you return to your primary care physician (or establish a relationship with a primary care physician if you do not have one) for your aftercare needs so that they can reassess your need for medications and monitor your lab values.     If you do not have a primary care physician, you can call [phone] for a physician referral.  Follow with Hospice services.  Get CBC, CMP checked by your doctor and again as further instructed.  Get a 2 view Chest X ray done next visit if you had Pneumonia of Lung problems at the [facility] reviewed and adjusted.  Please request your Prim.MD to go over all Hospital Tests and Procedure/Radiological results at the follow up, please get all Hospital records sent to your Prim MD by signing hospital release before you go home.  Activity: As tolerated with Full fall precautions use walker/cane & assistance as needed  Diet: low salt  For Heart failure patients - Check your Weight same time everyday, if you gain over 2 pounds, or you develop in leg swelling, experience more shortness of breath or chest pain, call your Primary MD immediately. Follow Cardiac Low Salt Diet and 1.8 lit/day fluid restriction.  Disposition Home with hospice  If you experience worsening of your admission symptoms, develop shortness of breath, life threatening emergency, suicidal or homicidal thoughts you must seek medical attention  immediately by calling 911 or calling your MD immediately  if symptoms less severe.  You Must read complete instructions/literature along with all the possible adverse reactions/side effects for all the Medicines you take and that have been prescribed to you. Take any new Medicines after you have completely understood and accpet all the possible adverse reactions/side effects.   Do not drive and provide baby sitting services if your were admitted for syncope or siezures until you have seen by Primary MD or a Neurologist and advised to do so again.  Do not drive when taking Pain medications.   Do not take more than prescribed Pain, Sleep and Anxiety Medications  Special Instructions: If you have smoked or chewed Tobacco  in the last 2 yrs please stop smoking, stop any regular Alcohol  and or any Recreational drug use.  Wear Seat belts while driving.

## 2013-08-27 NOTE — Progress Notes (Addendum)
Notified by [name], patient and family request services of [facility]) after discharge.  Spoke with pt who goes by [name]', wife, [name] and dtr [name] to initiate education related to hospice services, philosophy and team approach to care. Wife shared they spoke with [name] PMT last hospitalization and [name] has again suggested hospice services at home and they feel with [name]'s declining functional status they would like to have [facility] services after discharge.  During discussion [name] asked about minimizing the number of medications pt needs to take daily; she mentioned the cost of the Rifaximin and stated 'if he needs it of course' informed pt and wife that Probation officer would ask attending MD to review medications; they also noted concern about pt's increased weakness and would like a [name] PT evaluation to see if pt may need PTAR transport home - It may be helpful to check O2 sats on/off O2 with exertion   *Of Note per chart, pt currently on O2 @ 4 LNC -pt was NOT on O2 prior to this hospital admit- will MD comment on whether pt should be discharged on O2 for home use and at what liter flow  Please send completed GOLD DNR form home with pt  DME needs discussed per wife pt has a walker, shower stool, raised toilet seat  and a craftmatic bed at home - they want to wait and see if they will need a hospital bed or any other equipment  Initial paperwork faxed to [facility]  Please notify [facility] when patient is ready to leave unit at d/c call [phone] (or [phone] if after 5 pm);  [facility] information and contact numbers also given to wife during visit.   Above information shared with CMRN  Please call with any questions or concerns   Danton Sewer, RN 08/27/2013, 8:35 AM [facility] [place] RN Liaison [phone]  9:30am ADDENDUM: wife at [name] and asks about care of leaking R paracentesis site- dressing saturated -  unclear if this should be changed frequently with ABD pads or if want to consider ostomy drainage bag?  Attending physician please address in plan of care/discharge instructions Thank you

## 2013-08-27 NOTE — Progress Notes (Signed)
Spoke with pt and wife at bedside. Pt ready to dc home with [facility], no other needs at present time.

## 2013-08-27 NOTE — Progress Notes (Signed)
Asked to eval RLQ paracentesis site for leakage. RLQ site indeed with serous/thin yellow leakage into ostomy bag. Pt does have some mild soft tissue edema as well. OStomy appliance removed. Dry pressure applied until leakage ceased. Dermabond applied to site in multiple layers and allowed to dry. No further leakage seen.  Ascencion Dike PA-C Interventional Radiology 08/27/2013 12:11 PM

## 2013-08-27 NOTE — Telephone Encounter (Signed)
I spoke with [facility].  They will send orders when [name] is discharged and will call back or text Dr. Carlean Purl as asked in hospital progress note from him.

## 2013-08-27 NOTE — Progress Notes (Signed)
   COURTESY NOTE:  Mr. Rochford is my patient and was sent to hospital for what sounded like exacerbation of hepatic encephalopathy. I have recommended hospice care and wife/patient accepting. Please contact me by texting to [phone] with ?'s and to coordinate f/u care.  Thanks  Gatha Mayer, MD, [facility] [phone] (pager) 08/27/2013 9:59 AM

## 2013-08-27 NOTE — Progress Notes (Signed)
Inpatient Diabetes Program Recommendations  AACE/ADA: New Consensus Statement on Inpatient Glycemic Control (2013)  Target Ranges:  Prepandial:   less than 140 mg/dL      Peak postprandial:   less than 180 mg/dL (1-2 hours)      Critically ill patients:  140 - 180 mg/dL  Results for [name], [name] (MRN [id number]) as of 08/27/2013 12:30  Ref. Range 08/26/2013 17:22 08/26/2013 21:00 08/27/2013 07:18 08/27/2013 11:39  Glucose-Capillary Latest Range: 70-99 mg/dL 204 (H) 272 (H) 228 (H) 253 (H)   Inpatient Diabetes Program Recommendations Insulin - Basal: consider adding basal during steroid therapy Thank you  Raoul Pitch BSN, RN,CDE Inpatient Diabetes Coordinator [phone] (team pager)

## 2013-08-27 NOTE — Progress Notes (Deleted)
PROGRESS NOTE  [name] MBW:[id number] DOB: Oct 25, 1935 DOA: 08/26/2013 PCP: Unice Cobble, MD  HPI: [name] is a 78 y.o. male has a past medical history significant for liver cirrhosis, HCC s/p TACE, followed by Dr. Juliann Mule, history of TIPS 5 years ago, A fib, HTN, HLD, recurrent C diff infections, recurrent RLE cellulitis, recent SBP after paracentesis 4 days ago, presents to the ED with progressive abdominal distention, weakness, diarrhea, and continuous leakage from his most recent paracentesis site.  Assessment/Plan: Progressive abdominal distention - in the setting of decompensated cirrhosis, s/p repeat paracentesis 7/6 - persistent leakage from his paracentesis last week, have asked IR if they could seal with a dermabond or if it doesn;t work may need an ostomy bag.  - ?consideration of a permanent palliative drain once SBP is treated SBP - last paracentesis 7/2 with 463 whites and 66% segs c/w SBP, continue Ciprofloxacin  - check C diff Oxygen needs - on 4L [place], will ask PT on their evaluation to determine O2 needs - obtain CXR today Liver cirrhosis - with decompensation, continue home medications  Recurrent C. Diff infection - check C diff. He is on Cipro. Ongoing diarrhea, will hold lactulose.  - check C diff today DM2 - home regimen per Dr. Loanne Drilling note novolog 25 am, 15 lunch, 45 pm, will start lower at 20 TID here plus sliding scale, will increase today to 25 BID as he is hyperglycemic A fib - continue medications, now in sinus, no events, d/c tele today CAD s/p CABG - no chest pain, resume home meds  Chronic anemia and thrombocytopenia - due to liver disease, malignancy.  CKD stage II-III - monitor Cr   Diet: heart healthy Fluids: none DVT Prophylaxis: SCD  Code Status: DNR Family Communication: wife bedside  Disposition Plan: home when ready, likely Wed  Consultants:  None   Procedures:  Paracentesis 7/6    Antibiotics  Anti-infectives   Start      Dose/Rate Route Frequency Ordered Stop   08/26/13 2200  rifaximin (XIFAXAN) tablet 550 mg     550 mg Oral 2 times daily 08/26/13 1627     08/26/13 2000  ciprofloxacin (CIPRO) tablet 500 mg     500 mg Oral 2 times daily 08/26/13 1627       Antibiotics Given (last 72 hours)   Date/Time Action Medication Dose   08/26/13 2056 Given   ciprofloxacin (CIPRO) tablet 500 mg 500 mg   08/26/13 2259 Given   rifaximin (XIFAXAN) tablet 550 mg 550 mg   08/27/13 0841 Given   ciprofloxacin (CIPRO) tablet 500 mg 500 mg   08/27/13 1038 Given   rifaximin (XIFAXAN) tablet 550 mg 550 mg      HPI/Subjective: -   Objective: Filed Vitals:   08/26/13 1650 08/26/13 2100 08/26/13 2246 08/27/13 0453  BP: 136/65 137/72 130/60 136/49  Pulse: 65 79 70 61  Temp: 97.3 F (36.3 C) 98.6 F (37 C)  97.8 F (36.6 C)  TempSrc: Oral Oral  Oral  Resp: 16 16  16   Height: 6\' 3"  (1.905 m)     Weight: 111.1 kg (244 lb 14.9 oz)     SpO2: 100% 98%  100%    Intake/Output Summary (Last 24 hours) at 08/27/13 1140 Last data filed at 08/27/13 0453  Gross per 24 hour  Intake    980 ml  Output    275 ml  Net    705 ml   Filed Weights   08/26/13  1650  Weight: 111.1 kg (244 lb 14.9 oz)    Exam:   General:  NAD  Cardiovascular: regular rate and rhythm, 3/6 SEM  Respiratory: good air movement, clear to auscultation throughout, no wheezing, ronchi or rales  Abdomen: distended, ascites present, positive bowel sounds  MSK: 1-2+ pitting edema  Neuro: non focal, AxOx4  Data Reviewed: Basic Metabolic Panel:  Recent Labs Lab 08/26/13 1136  NA 139  K 4.0  CL 99  CO2 30  GLUCOSE 283*  BUN 23  CREATININE 1.33  CALCIUM 8.7   Liver Function Tests:  Recent Labs Lab 08/26/13 1136  AST 57*  ALT 29  ALKPHOS 251*  BILITOT 2.4*  PROT 5.5*  ALBUMIN 2.6*    Recent Labs Lab 08/26/13 1136  AMMONIA 59   CBC:  Recent Labs Lab 08/26/13 1136  WBC 4.5  HGB 12.5*  HCT 37.7*  MCV 100.0  PLT 49*     Cardiac Enzymes: No results found for this basename: CKTOTAL, CKMB, CKMBINDEX, TROPONINI,  in the last 168 hours BNP (last 3 results)  Recent Labs  08/02/13 0245  PROBNP 795.6*   CBG:  Recent Labs Lab 08/26/13 1722 08/26/13 2100 08/27/13 0718  GLUCAP 204* 272* 228*    No results found for this or any previous visit (from the past 240 hour(s)).   Studies: US Paracentesis  08/26/2013   CLINICAL DATA:  Hepatocellular carcinoma, cirrhosis, prior TIPS, recurrent ascites. Request is made for therapeutic paracentesis.  EXAM: ULTRASOUND GUIDED THERAPEUTIC  PARACENTESIS  COMPARISON:  PRIOR PARACENTESIS ON 08/22/2013  PROCEDURE: An ultrasound guided paracentesis was thoroughly discussed with the patient and questions answered. The benefits, risks, alternatives and complications were also discussed. The patient understands and wishes to proceed with the procedure. Written consent was obtained.  Ultrasound was performed to localize and mark an adequate pocket of fluid in the left lower quadrant of the abdomen. The area was then prepped and draped in the normal sterile fashion. 1% Lidocaine was used for local anesthesia. Under ultrasound guidance a 19 gauge Yueh catheter was introduced. Paracentesis was performed. The catheter was removed and a dressing applied.  Complications: None.  FINDINGS: A total of approximately 2.7 liters of slightly turbid, yellow fluid was removed.  IMPRESSION: Successful ultrasound guided therapeutic paracentesis yielding 2.7 liters of ascites.  Read by: Rowe Robert ,P.A.-C.   Electronically Signed   By: Daryll Brod M.D.   On: 08/26/2013 15:13    Scheduled Meds: . allopurinol  150 mg Oral q morning - 10a  . ciprofloxacin  500 mg Oral BID  . fentaNYL  100 mcg Transdermal Q72H  . ferrous gluconate  324 mg Oral BID  . furosemide  40 mg Oral BID  . insulin aspart  0-5 Units Subcutaneous QHS  . insulin aspart  0-9 Units Subcutaneous TID WC  . insulin aspart  20 Units  Subcutaneous TID WC  . isosorbide mononitrate  60 mg Oral q morning - 10a  . metoCLOPramide  5 mg Oral TID AC & HS  . metoprolol tartrate  25 mg Oral BID  . predniSONE  5 mg Oral Q breakfast  . rifaximin  550 mg Oral BID  . sodium chloride  500 mL Intravenous Once  . sodium chloride  3 mL Intravenous Q12H  . spironolactone  50 mg Oral q morning - 10a  . tamsulosin  0.4 mg Oral QPM   Continuous Infusions: . sodium chloride Stopped (08/26/13 1314)    Active Problems:   HYPERLIPIDEMIA  ANEMIA, IRON DEFICIENCY   THROMBOCYTOPENIA, CHRONIC   HYPERTENSION, ESSENTIAL NOS   CIRRHOSIS, ALCOHOLIC   Hepatic encephalopathy    ASCITES   CORONARY ARTERY BYPASS GRAFT, HX OF   CAD (coronary artery disease)   Atrial fibrillation   DM2 (diabetes mellitus, type 2)   Aortic stenosis   Diastolic CHF   Venous stasis of lower extremity   Esophageal dysmotility   HCC (hepatocellular carcinoma)   Decompensated hepatic cirrhosis   CKD (chronic kidney disease) stage 3, GFR 30-59 ml/min   Time spent: 35  This note has been created with Surveyor, quantity. Any transcriptional errors are unintentional.   Marzetta Board, MD [facility] Pager [phone]. If 7 PM - 7 AM, please contact night-coverage at [email or web address], password [facility] 08/27/2013, 11:40 AM  LOS: 1 day

## 2013-08-27 NOTE — Evaluation (Addendum)
Physical Therapy Evaluation Patient Details Name: [name] MRN: [id number] DOB: Dec 29, 1935 Today's Date: 08/27/2013   SATURATION QUALIFICATIONS: (This note is used to comply with regulatory documentation for home oxygen)  Patient Saturations on Room Air at Rest = 98%  Patient Saturations on Room Air while Ambulating = 97%   History of Present Illness  78 yo male admitted with hepatic cirrhosis, abdominal distention.   Clinical Impression  On eval, pt was supervision level for mobility-able to ambulate ~150 feet with walker. Pt tolerated activity well. Ready to d/c home from PT standpoint. No PT needs at discharge.     Follow Up Recommendations No PT follow up;Supervision/Assistance - 24 hour    Equipment Recommendations  None recommended by PT    Recommendations for Other Services       Precautions / Restrictions Precautions Precautions: Fall Restrictions Weight Bearing Restrictions: No      Mobility  Bed Mobility               General bed mobility comments: pt sitting in recliner  Transfers Overall transfer level: Needs assistance Equipment used: Rolling walker (2 wheeled) Transfers: Sit to/from Stand Sit to Stand: Supervision         General transfer comment: VCs safety, hand placement  Ambulation/Gait Ambulation/Gait assistance: Supervision Ambulation Distance (Feet): 150 Feet (150'x1, 50'x1) Assistive device: Rolling walker (2 wheeled) Gait Pattern/deviations: Step-through pattern;Trunk flexed     General Gait Details: slow gait speed. No LOB during session.   Stairs Stairs: Yes Stairs assistance: Min guard Stair Management: Forwards;Alternating pattern;One rail Right;One rail Left Number of Stairs: 4 General stair comments: close guard for safety.   Wheelchair Mobility    Modified Rankin (Stroke Patients Only)       Balance                                             Pertinent Vitals/Pain 98% RA at rest 97%  RA during ambulation     Home Living Family/patient expects to be discharged to:: Private residence Living Arrangements: Spouse/significant other Available Help at Discharge: Family;Available 24 hours/day Type of Home: House Home Access: Stairs to enter Entrance Stairs-Rails: Right Entrance Stairs-Number of Steps: 4 Home Layout: One level Home Equipment: Walker - 4 wheels;Cane - single point Additional Comments: lift chair, adjustable bed    Prior Function Level of Independence: Independent with assistive device(s)               Hand Dominance        Extremity/Trunk Assessment   Upper Extremity Assessment: Overall WFL for tasks assessed           Lower Extremity Assessment: Generalized weakness RLE Deficits / Details: bil lower ext edema (pitting) R>L.  H/o saphenous vein graft in right leg with chronic edema R>L due to poor venous return.   LLE Deficits / Details: edema still present, but less than R leg,     Communication      Cognition Arousal/Alertness: Awake/alert Behavior During Therapy: WFL for tasks assessed/performed Overall Cognitive Status: Within Functional Limits for tasks assessed                      General Comments      Exercises        Assessment/Plan    PT Assessment Patent does not need any further PT services  PT Diagnosis     PT Problem List    PT Treatment Interventions     PT Goals (Current goals can be found in the Care Plan section) Acute Rehab PT Goals Patient Stated Goal: to go home. PT Goal Formulation: No goals set, d/c therapy    Frequency     Barriers to discharge        Co-evaluation               End of Session Equipment Utilized During Treatment: Gait belt Activity Tolerance: Patient tolerated treatment well Patient left: in chair;with call bell/phone within reach;with family/visitor present      Functional Assessment Tool Used: clinical judgement Functional Limitation: Mobility:  Walking and moving around Mobility: Walking and Moving Around Current Status (C9470): At least 1 percent but less than 20 percent impaired, limited or restricted Mobility: Walking and Moving Around Goal Status [phone]): At least 1 percent but less than 20 percent impaired, limited or restricted Mobility: Walking and Moving Around Discharge Status [phone]): At least 1 percent but less than 20 percent impaired, limited or restricted    Time: 1510-1531 PT Time Calculation (min): 21 min   Charges:   PT Evaluation $Initial PT Evaluation Tier I: 1 Procedure PT Treatments $Gait Training: 8-22 mins   PT G Codes:   Functional Assessment Tool Used: clinical judgement Functional Limitation: Mobility: Walking and moving around    [employer], MPT Pager: [phone]

## 2013-08-28 ENCOUNTER — Telehealth: Payer: Self-pay | Admitting: Internal Medicine

## 2013-08-28 NOTE — Telephone Encounter (Signed)
pt wife called to cx appt due to not being on hospice

## 2013-08-29 ENCOUNTER — Other Ambulatory Visit: Payer: Medicare Other

## 2013-08-29 ENCOUNTER — Ambulatory Visit: Payer: Medicare Other

## 2013-09-02 ENCOUNTER — Encounter (HOSPITAL_COMMUNITY): Payer: Self-pay | Admitting: Emergency Medicine

## 2013-09-02 ENCOUNTER — Emergency Department (HOSPITAL_COMMUNITY)
Admission: EM | Admit: 2013-09-02 | Discharge: 2013-09-02 | Disposition: A | Discharge status: Home / Self Care | Attending: Emergency Medicine | Admitting: Emergency Medicine

## 2013-09-02 ENCOUNTER — Telehealth: Payer: Self-pay | Admitting: Internal Medicine

## 2013-09-02 ENCOUNTER — Emergency Department (HOSPITAL_COMMUNITY)

## 2013-09-02 DIAGNOSIS — I10 Essential (primary) hypertension: Secondary | ICD-10-CM | POA: Insufficient documentation

## 2013-09-02 DIAGNOSIS — I872 Venous insufficiency (chronic) (peripheral): Secondary | ICD-10-CM | POA: Insufficient documentation

## 2013-09-02 DIAGNOSIS — I501 Left ventricular failure: Secondary | ICD-10-CM | POA: Insufficient documentation

## 2013-09-02 DIAGNOSIS — G4733 Obstructive sleep apnea (adult) (pediatric): Secondary | ICD-10-CM | POA: Insufficient documentation

## 2013-09-02 DIAGNOSIS — Z9981 Dependence on supplemental oxygen: Secondary | ICD-10-CM | POA: Insufficient documentation

## 2013-09-02 DIAGNOSIS — I359 Nonrheumatic aortic valve disorder, unspecified: Secondary | ICD-10-CM | POA: Insufficient documentation

## 2013-09-02 DIAGNOSIS — I4891 Unspecified atrial fibrillation: Secondary | ICD-10-CM | POA: Insufficient documentation

## 2013-09-02 DIAGNOSIS — I2510 Atherosclerotic heart disease of native coronary artery without angina pectoris: Secondary | ICD-10-CM | POA: Insufficient documentation

## 2013-09-02 DIAGNOSIS — R188 Other ascites: Secondary | ICD-10-CM

## 2013-09-02 DIAGNOSIS — Z880 Allergy status to penicillin: Secondary | ICD-10-CM | POA: Insufficient documentation

## 2013-09-02 DIAGNOSIS — H5460 Unqualified visual loss, one eye, unspecified: Secondary | ICD-10-CM | POA: Insufficient documentation

## 2013-09-02 DIAGNOSIS — Z85828 Personal history of other malignant neoplasm of skin: Secondary | ICD-10-CM | POA: Insufficient documentation

## 2013-09-02 DIAGNOSIS — D649 Anemia, unspecified: Secondary | ICD-10-CM | POA: Insufficient documentation

## 2013-09-02 DIAGNOSIS — Z85820 Personal history of malignant melanoma of skin: Secondary | ICD-10-CM | POA: Insufficient documentation

## 2013-09-02 DIAGNOSIS — I209 Angina pectoris, unspecified: Secondary | ICD-10-CM | POA: Insufficient documentation

## 2013-09-02 DIAGNOSIS — Z794 Long term (current) use of insulin: Secondary | ICD-10-CM | POA: Insufficient documentation

## 2013-09-02 DIAGNOSIS — M1990 Unspecified osteoarthritis, unspecified site: Secondary | ICD-10-CM | POA: Insufficient documentation

## 2013-09-02 DIAGNOSIS — Z79899 Other long term (current) drug therapy: Secondary | ICD-10-CM | POA: Insufficient documentation

## 2013-09-02 DIAGNOSIS — K7460 Unspecified cirrhosis of liver: Secondary | ICD-10-CM | POA: Insufficient documentation

## 2013-09-02 DIAGNOSIS — Z951 Presence of aortocoronary bypass graft: Secondary | ICD-10-CM | POA: Insufficient documentation

## 2013-09-02 DIAGNOSIS — K7290 Hepatic failure, unspecified without coma: Secondary | ICD-10-CM

## 2013-09-02 DIAGNOSIS — Z8619 Personal history of other infectious and parasitic diseases: Secondary | ICD-10-CM | POA: Insufficient documentation

## 2013-09-02 DIAGNOSIS — I5032 Chronic diastolic (congestive) heart failure: Secondary | ICD-10-CM | POA: Insufficient documentation

## 2013-09-02 DIAGNOSIS — K219 Gastro-esophageal reflux disease without esophagitis: Secondary | ICD-10-CM | POA: Insufficient documentation

## 2013-09-02 DIAGNOSIS — G8929 Other chronic pain: Secondary | ICD-10-CM | POA: Insufficient documentation

## 2013-09-02 DIAGNOSIS — Z87891 Personal history of nicotine dependence: Secondary | ICD-10-CM | POA: Insufficient documentation

## 2013-09-02 DIAGNOSIS — I498 Other specified cardiac arrhythmias: Secondary | ICD-10-CM | POA: Insufficient documentation

## 2013-09-02 DIAGNOSIS — IMO0002 Reserved for concepts with insufficient information to code with codable children: Secondary | ICD-10-CM | POA: Insufficient documentation

## 2013-09-02 DIAGNOSIS — K7682 Hepatic encephalopathy: Secondary | ICD-10-CM | POA: Insufficient documentation

## 2013-09-02 DIAGNOSIS — Z872 Personal history of diseases of the skin and subcutaneous tissue: Secondary | ICD-10-CM | POA: Insufficient documentation

## 2013-09-02 DIAGNOSIS — R011 Cardiac murmur, unspecified: Secondary | ICD-10-CM | POA: Insufficient documentation

## 2013-09-02 DIAGNOSIS — E119 Type 2 diabetes mellitus without complications: Secondary | ICD-10-CM | POA: Insufficient documentation

## 2013-09-02 LAB — CBC WITH DIFFERENTIAL/PLATELET
Basophils Absolute: 0 10*3/uL (ref 0.0–0.1)
Basophils Relative: 0 % (ref 0–1)
Eosinophils Absolute: 0 10*3/uL (ref 0.0–0.7)
Eosinophils Relative: 0 % (ref 0–5)
HCT: 43.6 % (ref 39.0–52.0)
Hemoglobin: 15.1 g/dL (ref 13.0–17.0)
Lymphocytes Relative: 9 % — ABNORMAL LOW (ref 12–46)
Lymphs Abs: 0.6 10*3/uL — ABNORMAL LOW (ref 0.7–4.0)
MCH: 33.8 pg (ref 26.0–34.0)
MCHC: 34.6 g/dL (ref 30.0–36.0)
MCV: 97.5 fL (ref 78.0–100.0)
Monocytes Absolute: 0.7 10*3/uL (ref 0.1–1.0)
Monocytes Relative: 10 % (ref 3–12)
Neutro Abs: 5.5 10*3/uL (ref 1.7–7.7)
Neutrophils Relative %: 81 % — ABNORMAL HIGH (ref 43–77)
Platelets: 42 10*3/uL — ABNORMAL LOW (ref 150–400)
RBC: 4.47 MIL/uL (ref 4.22–5.81)
RDW: 16.6 % — ABNORMAL HIGH (ref 11.5–15.5)
WBC: 6.8 10*3/uL (ref 4.0–10.5)

## 2013-09-02 LAB — COMPREHENSIVE METABOLIC PANEL
ALT: 27 U/L (ref 0–53)
AST: 52 U/L — ABNORMAL HIGH (ref 0–37)
Albumin: 2.7 g/dL — ABNORMAL LOW (ref 3.5–5.2)
Alkaline Phosphatase: 262 U/L — ABNORMAL HIGH (ref 39–117)
Anion gap: 14 (ref 5–15)
BUN: 42 mg/dL — ABNORMAL HIGH (ref 6–23)
CO2: 27 mEq/L (ref 19–32)
Calcium: 9 mg/dL (ref 8.4–10.5)
Chloride: 94 mEq/L — ABNORMAL LOW (ref 96–112)
Creatinine, Ser: 1.97 mg/dL — ABNORMAL HIGH (ref 0.50–1.35)
GFR calc Af Amer: 37 mL/min — ABNORMAL LOW (ref >90)
GFR calc non Af Amer: 31 mL/min — ABNORMAL LOW (ref >90)
Glucose, Bld: 302 mg/dL — ABNORMAL HIGH (ref 70–99)
Potassium: 4.8 mEq/L (ref 3.7–5.3)
Sodium: 135 mEq/L — ABNORMAL LOW (ref 137–147)
Total Bilirubin: 3.1 mg/dL — ABNORMAL HIGH (ref 0.3–1.2)
Total Protein: 6 g/dL (ref 6.0–8.3)

## 2013-09-02 LAB — URINALYSIS, ROUTINE W REFLEX MICROSCOPIC
Bilirubin Urine: NEGATIVE
Glucose, UA: NEGATIVE mg/dL
Hgb urine dipstick: NEGATIVE
Ketones, ur: NEGATIVE mg/dL
Leukocytes, UA: NEGATIVE
Nitrite: NEGATIVE
Protein, ur: NEGATIVE mg/dL
Specific Gravity, Urine: 1.018 (ref 1.005–1.030)
Urobilinogen, UA: 0.2 mg/dL (ref 0.0–1.0)
pH: 5 (ref 5.0–8.0)

## 2013-09-02 LAB — PROTIME-INR
INR: >10 (ref 0.00–1.49)
Prothrombin Time: >90 seconds — ABNORMAL HIGH (ref 11.6–15.2)

## 2013-09-02 LAB — AMMONIA: Ammonia: 202 umol/L — ABNORMAL HIGH (ref 11–60)

## 2013-09-02 MED ORDER — ONDANSETRON HCL 4 MG/2ML IJ SOLN
4.0000 mg | Freq: Once | INTRAMUSCULAR | Status: AC
  Administered 2013-09-02: 4 mg via INTRAVENOUS
  Filled 2013-09-02: qty 2

## 2013-09-02 MED ORDER — FENTANYL CITRATE 0.05 MG/ML IJ SOLN
75.0000 ug | Freq: Once | INTRAMUSCULAR | Status: AC
  Administered 2013-09-02: 75 ug via INTRAVENOUS
  Filled 2013-09-02: qty 2

## 2013-09-02 MED ORDER — LACTULOSE 10 GM/15ML PO SOLN
30.0000 g | Freq: Once | ORAL | Status: AC
  Administered 2013-09-02: 30 g via ORAL
  Filled 2013-09-02: qty 45

## 2013-09-02 NOTE — ED Notes (Signed)
Family at bedside. 

## 2013-09-02 NOTE — Telephone Encounter (Signed)
Patient with a history of cirrhosis and newly diagnosed [place].  The Hospice nurse is at the house with the patient now.  She received a call from the patient's wife earlier this am, that the patient was vomiting, confused, and very distended abdomen.  The hospice nurse has assessed him and she reports he is lethargic, but aroused to verbal stimulation, his abdomen is tight and very distended (last paracentesis was 08/26/13 and they removed 2.7 liters and 7/2 they removed 4 liters), his lunges are clear, 02 saturation is 94 %, Pulse 78, BP 148/68.  The vomiting has stopped after a dose of Zofran.  The hospice nurse is questioning if he needs another paracentesis.  Dr. Fuller Plan you are MD of the day please advise.  I do not have any APP appts for the week to bring him in to be assessed.

## 2013-09-02 NOTE — Procedures (Signed)
Successful US guided paracentesis from RLQ.  Yielded 5.8 liters of clear yellow fluid.  No immediate complications.  Pt tolerated well.   Specimen was not sent for labs.  Tsosie Billing D PA-C 09/02/2013 2:35 PM

## 2013-09-02 NOTE — ED Notes (Signed)
MD at bedside. 

## 2013-09-02 NOTE — ED Notes (Addendum)
Informed consent form at bedside.

## 2013-09-02 NOTE — ED Provider Notes (Signed)
CSN: [id number]     Arrival date & time 09/02/13  1056 History   First MD Initiated Contact with Patient 09/02/13 1105     Chief Complaint  Patient presents with  . Abdominal Pain  . sent for paracentesis      (Consider location/radiation/quality/duration/timing/severity/associated sxs/prior Treatment) HPI  42yM accompanied by daughter and wife for evaluation of confusion. Has a history of cirrhosis and newly diagnosed [place]. Worsening abdominal distension. On hospice and nurse recommended being evaluated. Nausea and vomiting earlier but none since getting a dose of zofran. Wife took temperature and was 99. No recent falls that she is aware of. Chronic LE swelling, but wife reports that stable. Abdomen has been increasingly distended though. Very drowsy and has had some confusion.    Past Medical History  Diagnosis Date  . Diverticulosis   . Gastric antral vascular ectasia   . Rhinophyma   . CAD (coronary artery disease)     myoview 2007 no ischemia/ no ASA because of GI disease  . Atrial fibrillation     amiodarone in past, but problems and left off meds.  . Aortic stenosis     echo, September, 2010, mild aortic insufficiency  . Visual loss, one eye     right eye-ophthalmic arterial branch occlusion  . Internal hemorrhoids   . GERD (gastroesophageal reflux disease)   . Hypertension   . Allergic rhinitis   . Anemia     multifactorial  . Cirrhosis, alcoholic   . Thrombocytopenia   . Bleeding esophageal varices   . Encephalopathy, unspecified   . Gout   . Short-segment Barrett's esophagus     suspected  . Venous insufficiency   . Ejection fraction     EF 55%, echo, September, 2010  . Carotid bruit     Dopplers okay in the past  . Cellulitis     recurrent to RLE/notes 06/21/2012  . CHF (congestive heart failure)   . Sinus bradycardia     January, 2014  . Diastolic CHF     January, 2014  . Complication of anesthesia     "quit breathing once a long time ago; dropped my  BP too; it was after I was given Dilaudid" (06/21/2012)  . Heart murmur   . Anginal pain   . Shortness of breath     "stay that way most of the time now" (06/21/2012)  . OSA on CPAP   . Diabetes mellitus, type 2   . History of blood transfusion     "not related to OR" (06/21/2012)  . H/O hiatal hernia   . Osteoarthritis     "ALL OVER" (06/21/2012)  . Chronic lower back pain   . Aspiration into airway-silent on modified barium swallow 03/20/2013  . Melanoma     resected by [facility] 2008  . Skin cancer     Basal cell and squamous cell  . Esophageal dysmotility 10/09/2012  . SBP (spontaneous bacterial peritonitis) 08/22/2013    On paracentesis .,08/22/2013 cipro Rx Will consider suppressive Tx    Past Surgical History  Procedure Laterality Date  . Total knee arthroplasty Right   . Esophagogastroduodenoscopy  04/10/2008    esophageal varices, cardia varix  . Tips procedure  2/10  . Back surgery      X5 "for ruptured disc; last time did a fusion" (06/21/2012)  . Excisional hemorrhoidectomy    . Release scar contracture / graft repairs of hand      bilaterally   . Elbow surgery Right   .  Hiatal hernia repair    . Colonoscopy w/ polypectomy  02/14/2007    adenomatous polyps, diverticulosis, internal hemorrhoids/rectal varices  . Cataract extraction, bilateral  January 2013  . Tonsillectomy    . Appendectomy    . Joint replacement    . Coronary artery bypass graft  1993    6 Bypass  . Cataract extraction w/ intraocular lens  implant, bilateral     Family History  Problem Relation Age of Onset  . Alzheimer's disease Mother   . Cancer Father     GI (stomach?)  . Heart disease Sister     2 sisters  . Liver disease Sister   . Diabetes Brother   . Heart disease Brother     3 brothers  . Liver disease Brother   . Heart attack Other     sibling  . Melanoma Other     sibling  . Stroke Other     sibling  . Colon cancer Neg Hx    History  Substance Use Topics  . Smoking status: Former  Smoker -- 2.00 packs/day for 20 years    Types: Cigarettes    Quit date: 02/22/1975  . Smokeless tobacco: Never Used  . Alcohol Use: No     Comment: 06/21/2012 "none since Feb 2010"    Review of Systems  All systems reviewed and negative, other than as noted in HPI.   Allergies  Hydromorphone hcl; Shellfish-derived products; Doxycycline hyclate; Amoxicillin-pot clavulanate; Aspirin; Buprenorphine hcl-naloxone hcl; Ezetimibe; Meperidine hcl; Morphine; Morphine sulfate; Oxycodone; Sulfonamide derivatives; and Codeine  Home Medications   Prior to Admission medications   Medication Sig Start Date End Date Taking? Authorizing Provider  Ascorbic Acid (VITAMIN C) 500 MG tablet Take 500 mg by mouth every morning.    Yes Historical Provider, MD  Calcium-Magnesium-Zinc (CALCIUM/MAGNESIUM/ZINC FORMULA) 1000-500-50 MG TABS Take 1 tablet by mouth at bedtime.    Yes Historical Provider, MD  fentaNYL (DURAGESIC - DOSED MCG/HR) 100 MCG/HR Place 100 mcg onto the skin every 3 (three) days.   Yes Historical Provider, MD  Ferrous Gluconate (IRON) 240 (27 FE) MG TABS Take 1 tablet by mouth 2 (two) times daily.    Yes Historical Provider, MD  furosemide (LASIX) 40 MG tablet Take 40 mg by mouth 2 (two) times daily.   Yes Historical Provider, MD  hyoscyamine (ANASPAZ) 0.125 MG TBDP disintergrating tablet Place 0.125 mg under the tongue every 4 (four) hours as needed for bladder spasms or cramping.    Yes Historical Provider, MD  insulin aspart (NOVOLOG) 100 UNIT/ML injection Inject 15-25 Units into the skin 3 (three) times daily before meals. 25 units with breakfast and dinner. 15 units with lunch. Only use insulin if >50% of meal is eaten.   Yes Historical Provider, MD  isosorbide mononitrate (IMDUR) 60 MG 24 hr tablet Take 60 mg by mouth every morning.    Yes Historical Provider, MD  lactulose (CHRONULAC) 10 GM/15ML solution Take 20 g by mouth 2 (two) times daily.   Yes Historical Provider, MD  loratadine  (CLARITIN) 10 MG tablet Take 10 mg by mouth every morning.    Yes Historical Provider, MD  metoCLOPramide (REGLAN) 5 MG tablet Take 5 mg by mouth 4 (four) times daily -  before meals and at bedtime.   Yes Historical Provider, MD  metoprolol tartrate (LOPRESSOR) 25 MG tablet Take 25 mg by mouth 2 (two) times daily.   Yes Historical Provider, MD  nitroGLYCERIN (NITROSTAT) 0.4 MG SL tablet Place 0.4  mg under the tongue every 5 (five) minutes as needed for chest pain.   Yes Historical Provider, MD  ondansetron (ZOFRAN) 8 MG tablet Take 8 mg by mouth every 12 (twelve) hours as needed for nausea or vomiting.   Yes Historical Provider, MD  oxyCODONE (ROXICODONE) 15 MG immediate release tablet Take 15 mg by mouth 4 (four) times daily.   Yes Historical Provider, MD  polyethylene glycol (MIRALAX / GLYCOLAX) packet Take 17 g by mouth daily as needed for mild constipation.    Yes Historical Provider, MD  predniSONE (DELTASONE) 5 MG tablet Take 5 mg by mouth every morning.    Yes Historical Provider, MD  rifaximin (XIFAXAN) 550 MG TABS tablet Take 550 mg by mouth 2 (two) times daily.   Yes Historical Provider, MD  spironolactone (ALDACTONE) 50 MG tablet Take 50 mg by mouth every morning.   Yes Historical Provider, MD  tamsulosin (FLOMAX) 0.4 MG CAPS capsule Take 0.4 mg by mouth every evening.   Yes Historical Provider, MD  Thiamine HCl (VITAMIN B-1) 250 MG tablet Take 125 mg by mouth every morning.    Yes Historical Provider, MD  triamcinolone cream (KENALOG) 0.1 % Apply 1 application topically at bedtime.    Yes Historical Provider, MD  allopurinol (ZYLOPRIM) 300 MG tablet Take 150 mg by mouth every morning.     Historical Provider, MD   BP 127/53  Pulse 75  Temp(Src) 97.2 F (36.2 C) (Oral)  Resp 16  SpO2 97% Physical Exam  Nursing note and vitals reviewed. Constitutional: No distress.  Laying in bed snowing. Chronically ill appearing  HENT:  Head: Normocephalic and atraumatic.  Eyes: Conjunctivae  are normal. Right eye exhibits no discharge. Left eye exhibits no discharge.  Neck: Neck supple.  Cardiovascular: Normal rate, regular rhythm and normal heart sounds.  Exam reveals no gallop and no friction rub.   No murmur heard. Pulmonary/Chest: Effort normal and breath sounds normal. No respiratory distress.  Abdominal: Soft. He exhibits distension. There is no tenderness.  Musculoskeletal: He exhibits edema. He exhibits no tenderness.  B/l LE edema. Erythema RLE. No increased warmth. No tenderness. Per wife, does not appear acutely changed.   Neurological:  Drowsy. Opens eyes to conversational voice. Speech slow. Follows some simple commands. Moves all extremities.   Skin: Skin is warm and dry.    ED Course  Procedures (including critical care time) Labs Review Labs Reviewed  URINALYSIS, ROUTINE W REFLEX MICROSCOPIC - Abnormal; Notable for the following:    Color, Urine AMBER (*)    All other components within normal limits  CBC WITH DIFFERENTIAL - Abnormal; Notable for the following:    RDW 16.6 (*)    Platelets 42 (*)    Neutrophils Relative % 81 (*)    Lymphocytes Relative 9 (*)    Lymphs Abs 0.6 (*)    All other components within normal limits  COMPREHENSIVE METABOLIC PANEL - Abnormal; Notable for the following:    Sodium 135 (*)    Chloride 94 (*)    Glucose, Bld 302 (*)    BUN 42 (*)    Creatinine, Ser 1.97 (*)    Albumin 2.7 (*)    AST 52 (*)    Alkaline Phosphatase 262 (*)    Total Bilirubin 3.1 (*)    GFR calc non Af Amer 31 (*)    GFR calc Af Amer 37 (*)    All other components within normal limits  AMMONIA - Abnormal; Notable for the following:  Ammonia 202 (*)    All other components within normal limits  PROTIME-INR - Abnormal; Notable for the following:    Prothrombin Time >90.0 (*)    INR >10.00 (*)    All other components within normal limits    Imaging Review US Paracentesis  09/02/2013   CLINICAL DATA:  Hepatocellular carcinoma, cirrhosis,  prior TIPS, recurrent ascites, request for paracentesis.  EXAM: ULTRASOUND GUIDED THERAPEUTIC PARACENTESIS  COMPARISON:  None.  PROCEDURE: An ultrasound guided paracentesis was thoroughly discussed with the patient and questions answered. The benefits, risks, alternatives and complications were also discussed. The patient understands and wishes to proceed with the procedure. Written consent was obtained.  Ultrasound was performed to localize and mark an adequate pocket of fluid in the right lower quadrant of the abdomen. The area was then prepped and draped in the normal sterile fashion. 1% Lidocaine was used for local anesthesia. Under ultrasound guidance a 19 gauge Yueh catheter was introduced. Paracentesis was performed. The catheter was removed and a dressing applied.  Complications: None.  FINDINGS: A total of approximately 5.8 liters of clear yellow fluid was removed. A fluid sample was not sent for laboratory analysis.  IMPRESSION: Successful ultrasound guided paracentesis yielding 5.8 liters of ascites.  Read By:  Tsosie Billing PA-C   Electronically Signed   By: Jacqulynn Cadet M.D.   On: 09/02/2013 14:37   Dg Chest Portable 1 View  09/02/2013   CLINICAL DATA:  Abdominal pain.  EXAM: PORTABLE CHEST - 1 VIEW  COMPARISON:  August 27, 2013.  FINDINGS: Stable cardiomegaly. Status post coronary artery bypass graft. No pneumothorax is noted. Minimal left basilar opacity is noted concerning for subsegmental atelectasis. Minimal left pleural effusion may be present. Bony thorax intact.  IMPRESSION: Minimal left basilar opacity most consistent with subsegmental atelectasis. Minimal left pleural effusion is noted as well.   Electronically Signed   By: Sabino Dick M.D.   On: 09/02/2013 13:39     EKG Interpretation None      MDM   Final diagnoses:  Hepatic encephalopathy  Cirrhosis of liver with ascites, unspecified hepatic cirrhosis type    77yM with confusion and worsening abdominal  distension.Worsening renal function. Worsening synthetic function of liver with INR>10. Ammonia 200 which is likely contributing. Pt actually seems more awake/lucid after paracentesis. Discussed goals of care. Pt is on hospice. Family would like to avoid hospitalization. Pt would like to go home. Wife is primary caretaker and feels comfortable taking him home. Lactulose given. Have prescribed lactulose already. Will have them increase it for the next few days. Will discharge in keeping with their wishes. Return precautions discussed.    Virgel Manifold, MD 09/02/13 [phone]

## 2013-09-02 NOTE — Telephone Encounter (Signed)
[name] with Hospice notified.  She will direct the wife to go to ER for evaluation

## 2013-09-02 NOTE — ED Notes (Signed)
Bed: OI32 Expected date:  Expected time:  Means of arrival:  Comments: EMS- abdominal distention, paracentesis

## 2013-09-02 NOTE — ED Notes (Addendum)
Per ems pt is from home. Dr Fuller Plan sent pt to ED for paracentesis. Pt has been here the last two weeks for paracentesis. Pt reports nausea, vomiting, abdominal distention.  Pt is spitting up.    Upon arrival to ED. Pt has eyes half open, but eyes appear to be rolled back in head. Pt can answer some questions, states his head, abdomen, and feet hurt pain 4/10. rn asked about right leg swelling and redness and pt did not answer.

## 2013-09-02 NOTE — Telephone Encounter (Signed)
ED evaluation today or direct inpatient hospice care as per hospice

## 2013-09-12 ENCOUNTER — Ambulatory Visit: Payer: Medicare Other | Admitting: Podiatrist

## 2013-09-17 ENCOUNTER — Encounter: Payer: Self-pay | Admitting: Internal Medicine

## 2013-09-21 DEATH — deceased

## 2013-10-31 ENCOUNTER — Ambulatory Visit: Payer: Medicare Other | Admitting: Endocrinology

## 2014-06-13 ENCOUNTER — Encounter: Payer: Self-pay | Admitting: Internal Medicine

## 2014-06-13 ENCOUNTER — Encounter: Payer: Self-pay | Admitting: Gastroenterology

## 2016-02-06 IMAGING — CT CT CHEST W/ CM
2 of 4 series · 15 of 36 positions shown, 18 images · IV contrast (OMNIPAQUE)
Comparison: Chest CT 05/25/2012. Abdominal CT 03/21/2013. Abdominal
MRI 04/03/2013. Chest CT 06/22/2011

CLINICAL DATA: Hepatocellular carcinoma.  Staging.  .

EXAM:
CT CHEST WITH CONTRAST
TECHNIQUE: Multidetector CT imaging of the chest was performed during
intravenous contrast administration.
CONTRAST:  80mL OMNIPAQUE IOHEXOL 300 MG/ML  SOLN

[Series 2: chest with st · axial · 0.74mm/px · z∈[-326,-32]mm · 12 of 69 slices shown, 15 images]
[im 5/69  mediastinal]
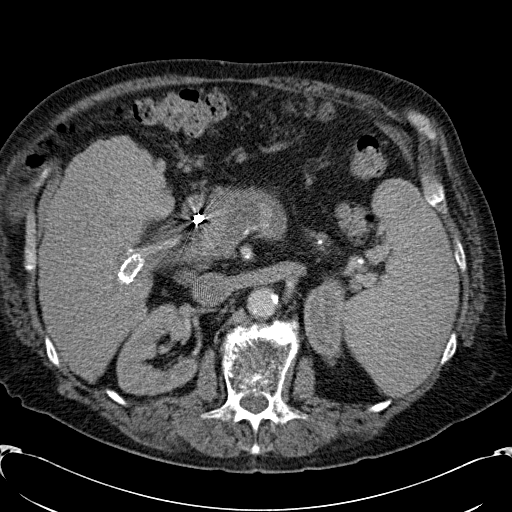
[im 5/69  lung]
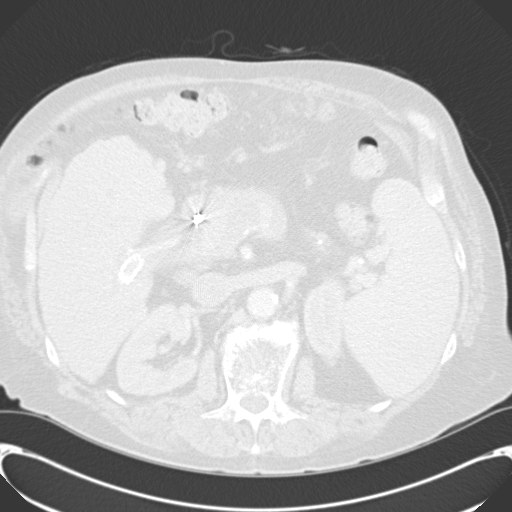
[im 10/69  lung]
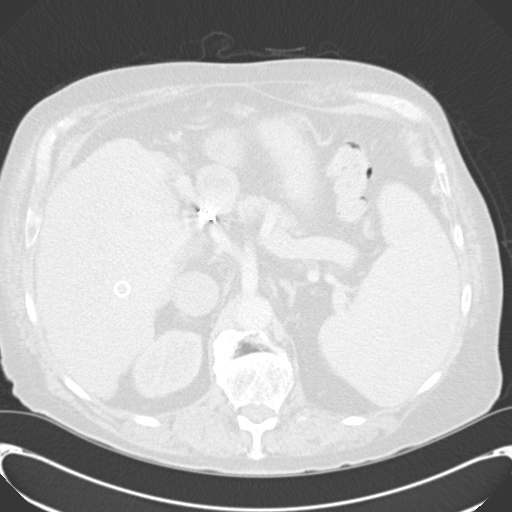
[im 15/69  lung]
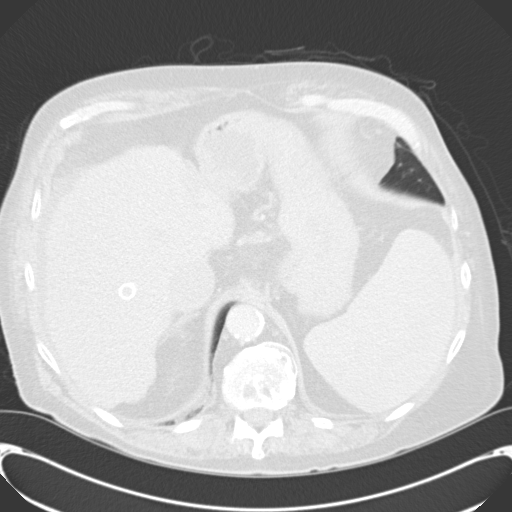
[im 20/69  lung]
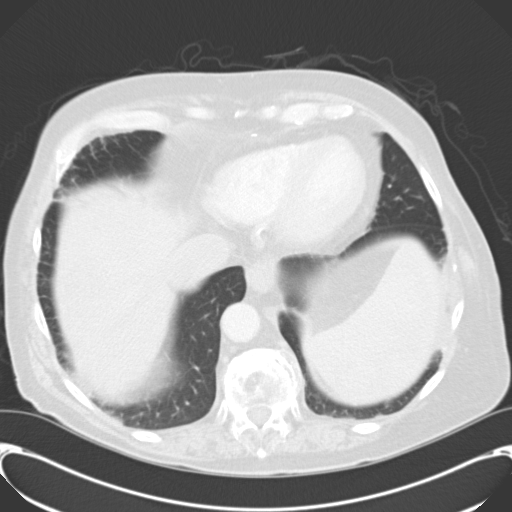
[im 25/69  mediastinal]
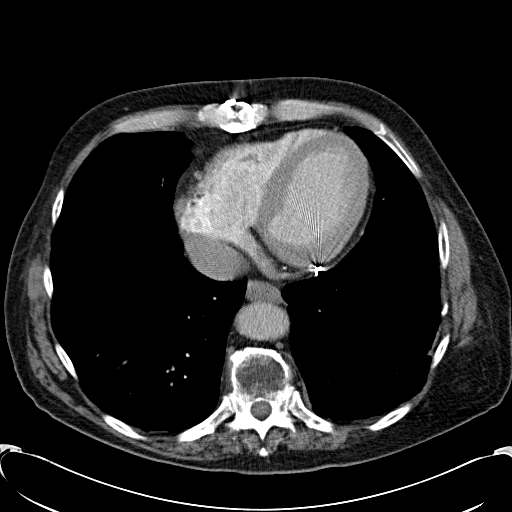
[im 25/69  lung]
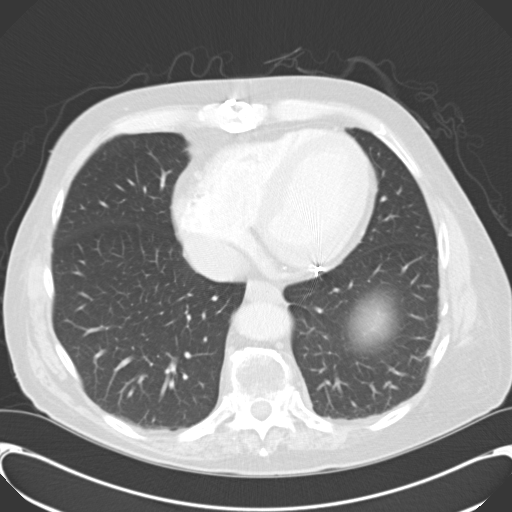
[im 30/69  lung]
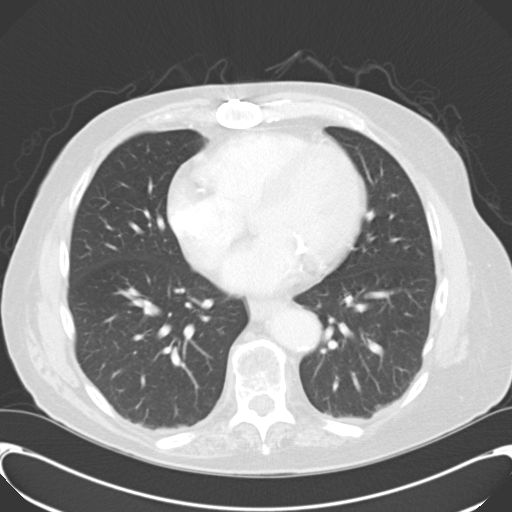
[im 39/69  lung]
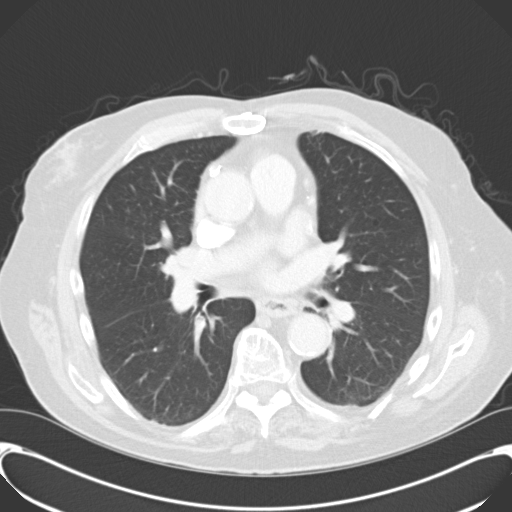
[im 44/69  lung]
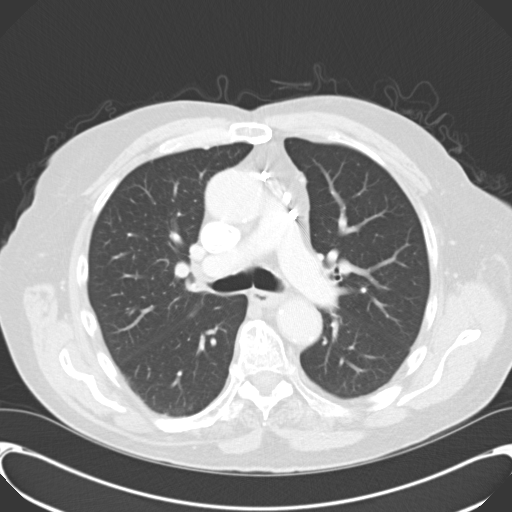
[im 49/69  mediastinal]
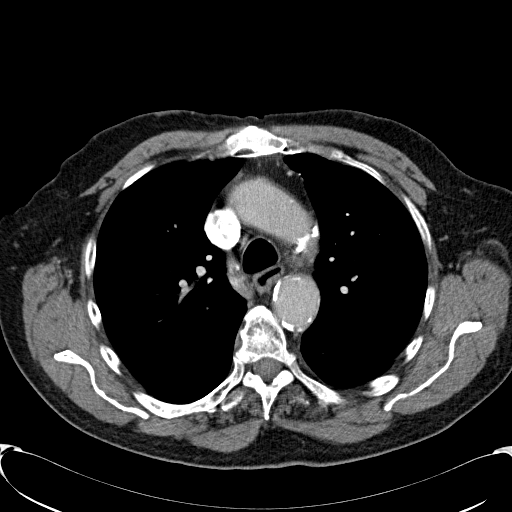
[im 49/69  lung]
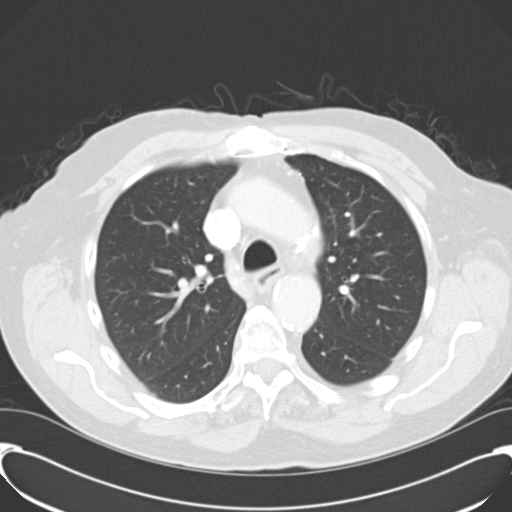
[im 54/69  lung]
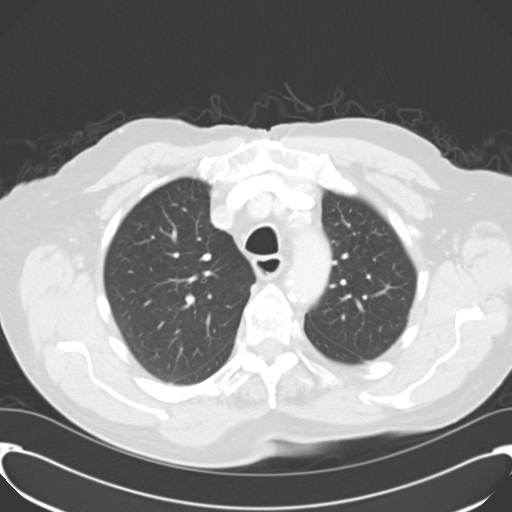
[im 59/69  lung]
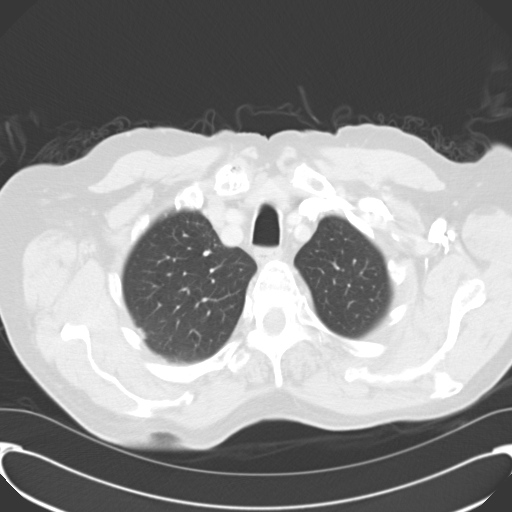
[im 64/69  lung]
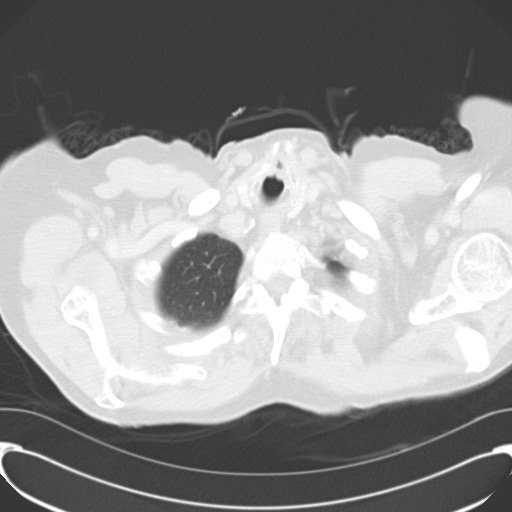

[Series 602: <mpr thick range> · coronal · 0.74mm/px · 3 of 100 slices shown]
[im 20/100  lung]
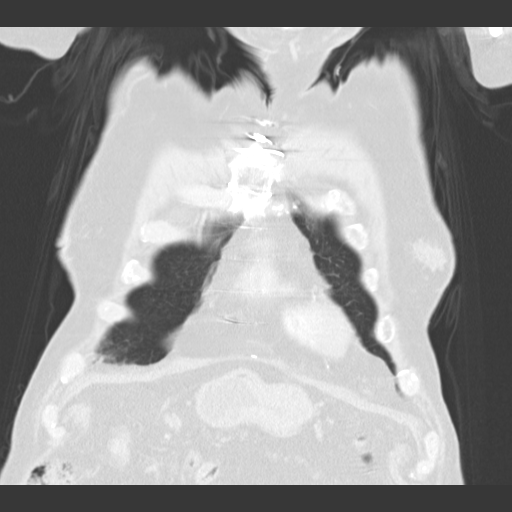
[im 40/100  lung]
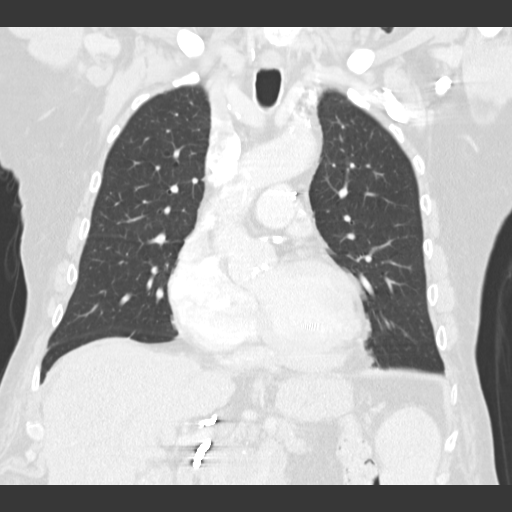
[im 60/100  lung]
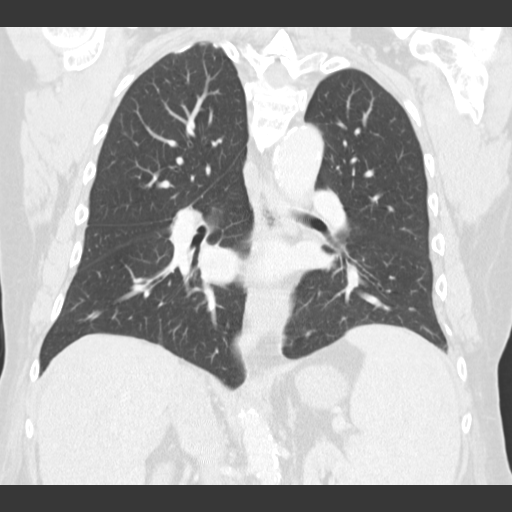

[15 of 36 positions shown; findings below may reference images not displayed]

FINDINGS: Lungs/Pleura: 3 mm right upper lobe lung nodule on image 19 was
likely present on image 36 of the 06/22/2011 exam.

Biapical calcified granulomas.

Lingular 3 mm nodule on image 35/series 5 is unchanged since 3435.

No pleural fluid.

Heart/Mediastinum: No supraclavicular adenopathy. Bilateral
gynecomastia. Median sternotomy with aortic atherosclerosis. Mild
cardiomegaly, without pericardial effusion. No central pulmonary
embolism, on this non-dedicated study. No mediastinal or hilar
adenopathy. Debris within the thoracic esophagus on image 17.

Upper Abdomen: TIPS shunt catheter. Poor parenchymal opacification
with contrast, including within the liver. Probable decrease in
hepatocellular carcinoma burden. 1.9 cm hepatic dome lesion on image
52 is at the site of a 3.6 cm lesion on 03/21/2013. Cirrhosis.
Splenomegaly. Underdistended proximal stomach. Portal vein
enlargement. Redemonstration of nonocclusive thrombus within the
splenoportal confluence. Suboptimally evaluated.

Cholelithiasis.  Normal imaged adrenal glands and kidneys.

Bones/Musculoskeletal: Advanced lower thoracic degenerative disc
disease. Diffuse idiopathic skeletal hyperostosis.
IMPRESSION: 1. No acute process or evidence of metastatic disease in the chest.
Tiny bilateral pulmonary nodules which are unchanged back to 3435. .
2. Since 03/21/2013, decreased hepatocellular carcinoma burden.
Suboptimally evaluated due to limitations as detailed above.
3. Cirrhosis with portal vein enlargement and grossly similar
nonocclusive thrombus in the splenoportal confluence.
4. Esophageal air fluid level suggests dysmotility or
gastroesophageal reflux.
